# Patient Record
Sex: Female | Born: 1950
Health system: Southern US, Community
[De-identification: ages and names within clinical notes are randomized; demographics above are authoritative.]

## PROBLEM LIST (undated history)

## (undated) DIAGNOSIS — G20 Parkinson's disease: Secondary | ICD-10-CM

## (undated) DIAGNOSIS — Z9989 Dependence on other enabling machines and devices: Secondary | ICD-10-CM

## (undated) DIAGNOSIS — I482 Chronic atrial fibrillation, unspecified: Secondary | ICD-10-CM

## (undated) DIAGNOSIS — E1144 Type 2 diabetes mellitus with diabetic amyotrophy: Secondary | ICD-10-CM

## (undated) DIAGNOSIS — G541 Lumbosacral plexus disorders: Secondary | ICD-10-CM

## (undated) DIAGNOSIS — R55 Syncope and collapse: Secondary | ICD-10-CM

## (undated) DIAGNOSIS — M797 Fibromyalgia: Secondary | ICD-10-CM

## (undated) DIAGNOSIS — R32 Unspecified urinary incontinence: Secondary | ICD-10-CM

## (undated) DIAGNOSIS — G20A1 Parkinson's disease without dyskinesia, without mention of fluctuations: Secondary | ICD-10-CM

## (undated) DIAGNOSIS — I509 Heart failure, unspecified: Secondary | ICD-10-CM

## (undated) DIAGNOSIS — E876 Hypokalemia: Secondary | ICD-10-CM

## (undated) DIAGNOSIS — G2581 Restless legs syndrome: Secondary | ICD-10-CM

## (undated) DIAGNOSIS — E119 Type 2 diabetes mellitus without complications: Secondary | ICD-10-CM

## (undated) DIAGNOSIS — I5032 Chronic diastolic (congestive) heart failure: Secondary | ICD-10-CM

## (undated) DIAGNOSIS — F411 Generalized anxiety disorder: Secondary | ICD-10-CM

## (undated) DIAGNOSIS — F329 Major depressive disorder, single episode, unspecified: Secondary | ICD-10-CM

## (undated) DIAGNOSIS — G4733 Obstructive sleep apnea (adult) (pediatric): Secondary | ICD-10-CM

## (undated) HISTORY — DX: Unspecified urinary incontinence: R32

## (undated) HISTORY — DX: Chronic diastolic (congestive) heart failure: I50.32

## (undated) HISTORY — DX: Generalized anxiety disorder: F41.1

## (undated) HISTORY — PX: TONSILLECTOMY: SUR1361

## (undated) HISTORY — PX: MASS EXCISION: SHX2000

## (undated) HISTORY — DX: Major depressive disorder, single episode, unspecified: F32.9

## (undated) HISTORY — PX: KNEE ARTHROSCOPY: SUR90

## (undated) HISTORY — DX: Chronic atrial fibrillation, unspecified: I48.20

## (undated) HISTORY — PX: VAGINAL HYSTERECTOMY: SUR661

## (undated) HISTORY — PX: TOTAL KNEE ARTHROPLASTY: SHX125

## (undated) HISTORY — PX: JOINT REPLACEMENT: SHX530

## (undated) HISTORY — PX: TRANSTHORACIC ECHOCARDIOGRAM: SHX275

## (undated) HISTORY — PX: CHOLECYSTECTOMY OPEN: SUR202

## (undated) HISTORY — PX: SHOULDER OPEN ROTATOR CUFF REPAIR: SHX2407

---

## 1998-04-26 ENCOUNTER — Other Ambulatory Visit: Admission: RE | Admit: 1998-04-26 | Discharge: 1998-04-26 | Payer: Self-pay | Admitting: *Deleted

## 1999-07-05 ENCOUNTER — Ambulatory Visit (HOSPITAL_COMMUNITY): Admission: RE | Admit: 1999-07-05 | Discharge: 1999-07-05 | Payer: Self-pay | Admitting: Gastroenterology

## 2002-08-17 ENCOUNTER — Ambulatory Visit (HOSPITAL_COMMUNITY): Admission: RE | Admit: 2002-08-17 | Discharge: 2002-08-17 | Payer: Self-pay | Admitting: Specialist

## 2002-08-17 ENCOUNTER — Encounter: Payer: Self-pay | Admitting: Specialist

## 2002-08-17 ENCOUNTER — Encounter (INDEPENDENT_AMBULATORY_CARE_PROVIDER_SITE_OTHER): Payer: Self-pay | Admitting: *Deleted

## 2003-03-04 DIAGNOSIS — I482 Chronic atrial fibrillation, unspecified: Secondary | ICD-10-CM

## 2003-03-04 HISTORY — DX: Chronic atrial fibrillation, unspecified: I48.20

## 2003-06-19 ENCOUNTER — Other Ambulatory Visit: Admission: RE | Admit: 2003-06-19 | Discharge: 2003-06-19 | Payer: Self-pay | Admitting: *Deleted

## 2005-07-01 ENCOUNTER — Other Ambulatory Visit: Admission: RE | Admit: 2005-07-01 | Discharge: 2005-07-01 | Payer: Self-pay | Admitting: *Deleted

## 2005-07-22 ENCOUNTER — Encounter: Admission: RE | Admit: 2005-07-22 | Discharge: 2005-07-22 | Payer: Self-pay | Admitting: General Surgery

## 2006-12-17 ENCOUNTER — Ambulatory Visit (HOSPITAL_COMMUNITY): Admission: RE | Admit: 2006-12-17 | Discharge: 2006-12-18 | Payer: Self-pay | Admitting: Orthopaedic Surgery

## 2009-04-20 ENCOUNTER — Ambulatory Visit (HOSPITAL_COMMUNITY): Admission: RE | Admit: 2009-04-20 | Discharge: 2009-04-20 | Payer: Self-pay | Admitting: Rheumatology

## 2010-04-29 ENCOUNTER — Other Ambulatory Visit: Payer: Self-pay | Admitting: Orthopaedic Surgery

## 2010-04-29 DIAGNOSIS — M898X2 Other specified disorders of bone, upper arm: Secondary | ICD-10-CM

## 2010-05-04 ENCOUNTER — Ambulatory Visit
Admission: RE | Admit: 2010-05-04 | Discharge: 2010-05-04 | Disposition: A | Discharge status: Home / Self Care | Payer: Managed Care, Other (non HMO) | Source: Ambulatory Visit | Attending: Orthopaedic Surgery | Admitting: Orthopaedic Surgery

## 2010-05-04 DIAGNOSIS — M898X2 Other specified disorders of bone, upper arm: Secondary | ICD-10-CM

## 2010-05-20 ENCOUNTER — Other Ambulatory Visit: Payer: Self-pay | Admitting: Orthopaedic Surgery

## 2010-05-20 DIAGNOSIS — M25512 Pain in left shoulder: Secondary | ICD-10-CM

## 2010-05-25 ENCOUNTER — Ambulatory Visit
Admission: RE | Admit: 2010-05-25 | Discharge: 2010-05-25 | Disposition: A | Discharge status: Home / Self Care | Payer: Medicare Other | Source: Ambulatory Visit | Attending: Orthopaedic Surgery | Admitting: Orthopaedic Surgery

## 2010-05-25 DIAGNOSIS — M25512 Pain in left shoulder: Secondary | ICD-10-CM

## 2010-07-16 NOTE — Op Note (Signed)
NAME:  Donna [name], Donna [name]                   ACCOUNT NO.:  [id number]   MEDICAL RECORD NO.:  [id number]          PATIENT TYPE:  AMB   LOCATION:  SDS                          FACILITY:  [facility]   PHYSICIAN:  Claude Manges. Whitfield, M.D.DATE OF BIRTH:  1950/11/28   DATE OF PROCEDURE:  12/17/2006  DATE OF DISCHARGE:                               OPERATIVE REPORT   PREOPERATIVE DIAGNOSIS:  Rotator cuff tear with impingement of right  shoulder.   POSTOPERATIVE DIAGNOSES:  1. Rotator cuff tear with impingement of right shoulder.  2. Biceps tendon tear.   PROCEDURES:  1. Arthroscopic debridement right shoulder.  2. Open anterior-inferior acromioplasty.  3. Open biceps tendon tenodesis.  4. Open rotator cuff tear repair.   SURGEON:  Claude Manges. Cleophas Dunker, M.D.   ASSISTANT:  Rexene Edison, [facility].   ANESTHESIA:  General with supplemental interscalene nerve block.   COMPLICATIONS:  None.   HISTORY:  A 60 year old female who has been followed for the problems  referable to her right shoulder.  She has had difficulty for months,  particularly with pain and overhead activity.  She has evidence of  weakness and has tried anti-inflammatory medicines and pain medicines  without much relief.  She has had an MRI scan revealing a rotator cuff  tear, possibly a labral tear and a subacromial bursa formation.  She  wishes to proceed with arthroscopy and cuff tear repair and has been  cleared by her primary care physician.  She has multiple medical  problems.   PROCEDURE:  With the patient comfortable on the operating room table  under general orotracheal anesthesia, the patient was placed in semi-  sitting position with a shoulder frame.  Examination under anesthesia of  the right shoulder revealed no evidence of subluxation or adhesive  capsulitis.   The right shoulder was then prepped with DuraPrep from the base of neck  circumferentially to below the elbow.  Sterile draping was performed.   A marking pen was  used to outline what I thought was the [facility] joint, the  coracoid and the acromion.  Because of the patient's size, it was  technically very difficult to palpate landmarks and the procedure was  also very difficult because even the arthroscopy was challenging.   At a point a fingerbreadth posterior and medial to the posterior angle  acromion, a small incision was made and the arthroscope placed into the  shoulder joint.  The scope went in all the way to the hub.  I could  visualize the joint.  There was significant tearing of the biceps tendon  with multiple areas of fraying.  I thought the labrum was intact without  evidence of a tear.  There were some areas of chondromalacia of the  glenoid and particularly the humeral head where there was considerable  gouging of the articular cartilage consistent with the significant  osteoarthritis.  There was also synovitis throughout the joint.  A  second portal was established and shaving of the synovitis and humeral  head was performed.  On further visualization of the biceps tendon, it  was  felt that there was very little left and significant tearing and  accordingly, I elected to perform a biceps tenodesis.  There was also an  obvious rotator cuff tear as I could visualize a subacromial space.   I had difficulty placing the scope in the subacromial space and felt  that the best procedure was to make an incision anteriorly, not only  perform the acromioplasty but the tenodesis and the cuff tear repair.  About a 2 inch incision was then made over the anterior aspect of the  shoulder via sharp dissection.  Incision was carried down through  abundant adipose tissue.  There was approximately 3 inches or so of  adipose tissue before I encountered the deltoid fascia.  The fascia  seemed to be very poor quality.  I incised along what I felt was a raphe  and then separated the fibers.  The subacromial space was entered.  There was a moderate amount of  bursal material which was resected.  The  cuff tear was identified.  It was an L in shape involving the  supraspinatus.  I could retrieve the biceps tendon from the medial  aspect of the cuff tear, tagged it and incised it from its attachment to  the superior glenoid and then performed a tenodesis in the groove with a  Mitek anchor.  The tendon was considerably thick and torn.   The rough edges of the rotator cuff were sharply debrided.  I  established bleeding bone.  I inserted one Mitek anchor and then  performed the repair with 0 Ethibond suture.  I had a very nice repair  without any tension and supplemented with suture through bone.  The bone  also appeared to be osteopenic.   There was some anterior and lateral overhang of the acromion retracting  the shoulder and allowing space.  I removed the anterior and lateral  overhang with a rasp.  I got a very nice decompression.  I did not think  that there was considerable degenerative change or impingement from the  distal clavicle and therefore left it alone.  I irrigated the  subacromial space  again with a finger palpation and felt that I had a  nice decompression.  The deltoid fascia was then reapproximated although  I thought it was very flimsy tissue.  The wound was then closed in  several layers of 0 and 2-0 Vicryl, skin closed with skin clips.  Sterile bulky dressing was applied followed by a sling.   PLAN:  A 23-hour observation, then discharge with a sling and follow up  in a week.      Claude Manges. Cleophas Dunker, M.D.  Electronically Signed     PWW/MEDQ  D:  12/17/2006  T:  12/18/2006  Job:  [id number]

## 2010-07-19 NOTE — Procedures (Signed)
[place]  Patient:    Donna [name], Donna [name]                          MRN: [id number] Proc. Date: 07/05/99 Adm. Date:  [id number] Disc. Date: [id number] Attending:  Louie Bun CC:         Elgie Congo, M.D.                           Procedure Report  PROCEDURE PERFORMED:  Colonoscopy  INDICATION FOR PROCEDURE:  Iron deficiency anemia in a 60 year old patient who has previously had a hysterectomy.  Procedure is primarily to rule out neoplasm or inflammatory process of the colon.  DESCRIPTION OF PROCEDURE:  The patient was placed in the left lateral decubitus position and placed on the pulse monitor with continuous low flow oxygen delivered by nasal cannula.  She was sedated with 100 mg of IV Demerol and 8 mg IV Versed.  The Olympus video colonoscope was inserted into the rectum and advanced to the cecum, confirmed by transillumination of McBurneys point and visualization of the ileocecal valve and appendiceal orifice.  Prep was excellent. The cecum, ascending, transverse and descending colon all appeared normal with no masses, polyps, diverticula, or other mucosal abnormalities.  In the sigmoid colon were seen a few scattered diverticula. No other abnormalities noted.  The rectum appeared normal. On retroflex view the anus revealed no obvious internal hemorrhoids.  The colonoscope was then withdrawn and the patient returned to the recovery room in stable condition. She tolerated the procedure well and there were no immediate complications.  IMPRESSION:  Scattered sigmoid diverticula otherwise normal colonoscopy. DD:  07/05/99 TD:  07/09/99 Job: [id number]

## 2010-09-06 ENCOUNTER — Other Ambulatory Visit (HOSPITAL_COMMUNITY): Payer: Self-pay | Admitting: Orthopaedic Surgery

## 2010-09-06 ENCOUNTER — Encounter (HOSPITAL_COMMUNITY)
Admission: RE | Admit: 2010-09-06 | Discharge: 2010-09-06 | Disposition: A | Discharge status: Home / Self Care | Payer: Medicare Other | Source: Ambulatory Visit | Attending: Orthopaedic Surgery | Admitting: Orthopaedic Surgery

## 2010-09-06 ENCOUNTER — Ambulatory Visit (HOSPITAL_COMMUNITY)
Admission: RE | Admit: 2010-09-06 | Discharge: 2010-09-06 | Disposition: A | Discharge status: Home / Self Care | Payer: Medicare Other | Source: Ambulatory Visit | Attending: Orthopaedic Surgery | Admitting: Orthopaedic Surgery

## 2010-09-06 DIAGNOSIS — M25819 Other specified joint disorders, unspecified shoulder: Secondary | ICD-10-CM | POA: Insufficient documentation

## 2010-09-06 DIAGNOSIS — M7542 Impingement syndrome of left shoulder: Secondary | ICD-10-CM

## 2010-09-06 DIAGNOSIS — Z01818 Encounter for other preprocedural examination: Secondary | ICD-10-CM | POA: Insufficient documentation

## 2010-09-06 DIAGNOSIS — I10 Essential (primary) hypertension: Secondary | ICD-10-CM | POA: Insufficient documentation

## 2010-09-06 DIAGNOSIS — Z01810 Encounter for preprocedural cardiovascular examination: Secondary | ICD-10-CM | POA: Insufficient documentation

## 2010-09-06 DIAGNOSIS — Z01812 Encounter for preprocedural laboratory examination: Secondary | ICD-10-CM | POA: Insufficient documentation

## 2010-09-06 DIAGNOSIS — R0602 Shortness of breath: Secondary | ICD-10-CM | POA: Insufficient documentation

## 2010-09-06 LAB — COMPREHENSIVE METABOLIC PANEL
AST: 42 U/L — ABNORMAL HIGH (ref 0–37)
Albumin: 3.5 g/dL (ref 3.5–5.2)
Calcium: 10 mg/dL (ref 8.4–10.5)
Chloride: 102 mEq/L (ref 96–112)
Creatinine, Ser: 0.75 mg/dL (ref 0.50–1.10)
Total Bilirubin: 0.4 mg/dL (ref 0.3–1.2)
Total Protein: 6.9 g/dL (ref 6.0–8.3)

## 2010-09-06 LAB — CBC
HCT: 35.4 % — ABNORMAL LOW (ref 36.0–46.0)
RDW: 17.9 % — ABNORMAL HIGH (ref 11.5–15.5)
WBC: 10.7 10*3/uL — ABNORMAL HIGH (ref 4.0–10.5)

## 2010-09-06 LAB — DIFFERENTIAL
Basophils Absolute: 0.1 10*3/uL (ref 0.0–0.1)
Basophils Relative: 1 % (ref 0–1)
Eosinophils Absolute: 0.2 10*3/uL (ref 0.0–0.7)
Eosinophils Relative: 2 % (ref 0–5)
Lymphocytes Relative: 29 % (ref 12–46)
Lymphs Abs: 3.1 10*3/uL (ref 0.7–4.0)
Monocytes Absolute: 0.7 10*3/uL (ref 0.1–1.0)
Monocytes Relative: 6 % (ref 3–12)
Neutro Abs: 6.6 10*3/uL (ref 1.7–7.7)
Neutrophils Relative %: 62 % (ref 43–77)

## 2010-09-06 LAB — URINALYSIS, ROUTINE W REFLEX MICROSCOPIC
Glucose, UA: NEGATIVE mg/dL
Hgb urine dipstick: NEGATIVE
Specific Gravity, Urine: 1.02 (ref 1.005–1.030)
pH: 7 (ref 5.0–8.0)

## 2010-09-06 LAB — URINE MICROSCOPIC-ADD ON

## 2010-09-06 LAB — APTT: aPTT: 39 s — ABNORMAL HIGH (ref 24–37)

## 2010-09-06 LAB — PROTIME-INR
INR: 1.37 (ref 0.00–1.49)
Prothrombin Time: 17.1 s — ABNORMAL HIGH (ref 11.6–15.2)

## 2010-09-06 LAB — SURGICAL PCR SCREEN
MRSA, PCR: NEGATIVE
Staphylococcus aureus: NEGATIVE

## 2010-09-10 ENCOUNTER — Ambulatory Visit (HOSPITAL_COMMUNITY)
Admission: RE | Admit: 2010-09-10 | Discharge: 2010-09-11 | Disposition: A | Discharge status: Home / Self Care | Payer: Medicare Other | Source: Ambulatory Visit | Attending: Orthopaedic Surgery | Admitting: Orthopaedic Surgery

## 2010-09-10 DIAGNOSIS — M67919 Unspecified disorder of synovium and tendon, unspecified shoulder: Secondary | ICD-10-CM | POA: Insufficient documentation

## 2010-09-10 DIAGNOSIS — G2581 Restless legs syndrome: Secondary | ICD-10-CM | POA: Insufficient documentation

## 2010-09-10 DIAGNOSIS — I4891 Unspecified atrial fibrillation: Secondary | ICD-10-CM | POA: Insufficient documentation

## 2010-09-10 DIAGNOSIS — Z01810 Encounter for preprocedural cardiovascular examination: Secondary | ICD-10-CM | POA: Insufficient documentation

## 2010-09-10 DIAGNOSIS — Z01812 Encounter for preprocedural laboratory examination: Secondary | ICD-10-CM | POA: Insufficient documentation

## 2010-09-10 DIAGNOSIS — E119 Type 2 diabetes mellitus without complications: Secondary | ICD-10-CM | POA: Insufficient documentation

## 2010-09-10 DIAGNOSIS — M719 Bursopathy, unspecified: Secondary | ICD-10-CM | POA: Insufficient documentation

## 2010-09-10 DIAGNOSIS — Z79899 Other long term (current) drug therapy: Secondary | ICD-10-CM | POA: Insufficient documentation

## 2010-09-10 DIAGNOSIS — IMO0002 Reserved for concepts with insufficient information to code with codable children: Secondary | ICD-10-CM | POA: Insufficient documentation

## 2010-09-10 DIAGNOSIS — M25819 Other specified joint disorders, unspecified shoulder: Secondary | ICD-10-CM | POA: Insufficient documentation

## 2010-09-10 DIAGNOSIS — M659 Unspecified synovitis and tenosynovitis, unspecified site: Secondary | ICD-10-CM | POA: Insufficient documentation

## 2010-09-10 DIAGNOSIS — E559 Vitamin D deficiency, unspecified: Secondary | ICD-10-CM | POA: Insufficient documentation

## 2010-09-10 DIAGNOSIS — Z7982 Long term (current) use of aspirin: Secondary | ICD-10-CM | POA: Insufficient documentation

## 2010-09-10 DIAGNOSIS — I10 Essential (primary) hypertension: Secondary | ICD-10-CM | POA: Insufficient documentation

## 2010-09-10 DIAGNOSIS — K589 Irritable bowel syndrome without diarrhea: Secondary | ICD-10-CM | POA: Insufficient documentation

## 2010-09-10 DIAGNOSIS — I509 Heart failure, unspecified: Secondary | ICD-10-CM | POA: Insufficient documentation

## 2010-09-10 DIAGNOSIS — IMO0001 Reserved for inherently not codable concepts without codable children: Secondary | ICD-10-CM | POA: Insufficient documentation

## 2010-09-10 DIAGNOSIS — G4733 Obstructive sleep apnea (adult) (pediatric): Secondary | ICD-10-CM | POA: Insufficient documentation

## 2010-09-10 DIAGNOSIS — M7500 Adhesive capsulitis of unspecified shoulder: Secondary | ICD-10-CM | POA: Insufficient documentation

## 2010-09-10 DIAGNOSIS — M249 Joint derangement, unspecified: Secondary | ICD-10-CM | POA: Insufficient documentation

## 2010-09-10 DIAGNOSIS — M19019 Primary osteoarthritis, unspecified shoulder: Secondary | ICD-10-CM | POA: Insufficient documentation

## 2010-09-10 DIAGNOSIS — Z7901 Long term (current) use of anticoagulants: Secondary | ICD-10-CM | POA: Insufficient documentation

## 2010-09-10 DIAGNOSIS — G43909 Migraine, unspecified, not intractable, without status migrainosus: Secondary | ICD-10-CM | POA: Insufficient documentation

## 2010-09-10 DIAGNOSIS — F3289 Other specified depressive episodes: Secondary | ICD-10-CM | POA: Insufficient documentation

## 2010-09-10 DIAGNOSIS — F329 Major depressive disorder, single episode, unspecified: Secondary | ICD-10-CM | POA: Insufficient documentation

## 2010-09-10 DIAGNOSIS — M9420 Chondromalacia, unspecified site: Secondary | ICD-10-CM | POA: Insufficient documentation

## 2010-09-10 DIAGNOSIS — Z85828 Personal history of other malignant neoplasm of skin: Secondary | ICD-10-CM | POA: Insufficient documentation

## 2010-09-10 DIAGNOSIS — E782 Mixed hyperlipidemia: Secondary | ICD-10-CM | POA: Insufficient documentation

## 2010-09-10 DIAGNOSIS — E669 Obesity, unspecified: Secondary | ICD-10-CM | POA: Insufficient documentation

## 2010-09-10 LAB — GLUCOSE, CAPILLARY

## 2010-09-11 LAB — BASIC METABOLIC PANEL
BUN: 15 mg/dL (ref 6–23)
CO2: 28 mEq/L (ref 19–32)
Calcium: 8.6 mg/dL (ref 8.4–10.5)
Creatinine, Ser: 0.88 mg/dL (ref 0.50–1.10)
GFR calc Af Amer: >60 mL/min (ref >60)

## 2010-09-11 LAB — PROTIME-INR
INR: 1.16 (ref 0.00–1.49)
Prothrombin Time: 15 seconds (ref 11.6–15.2)

## 2010-09-11 LAB — GLUCOSE, CAPILLARY: Glucose-Capillary: 138 mg/dL — ABNORMAL HIGH (ref 70–99)

## 2010-09-11 NOTE — Op Note (Signed)
Donna [name], Donna [name]                   ACCOUNT NO.:  [id number]  MEDICAL RECORD NO.:  [id number]  LOCATION:  5003                         FACILITY:  [facility]  PHYSICIAN:  Claude Manges. Kamela Blansett, M.D.DATE OF BIRTH:  Mar 05, 1950  DATE OF PROCEDURE:  09/10/2010 DATE OF DISCHARGE:                              OPERATIVE REPORT   PREOPERATIVE DIAGNOSES: 1. Rotator cuff tear, left shoulder. 2. Impingement syndrome, left shoulder. 3. Degenerative joint disease, acromioclavicular joint, left shoulder. 4. Tear of biceps tendon, left shoulder. 5. Obesity.  POSTOPERATIVE DIAGNOSES: 1. Rotator cuff tear, left shoulder. 2. Impingement syndrome, left shoulder. 3. Degenerative joint disease, acromioclavicular joint, left shoulder. 4. Tear of biceps tendon, left shoulder. 5. Obesity. 6. Glenohumeral joint chondromalacia and labral tearing.  PROCEDURES: 1. Evaluation of left shoulder under anesthesia with lysis of adhesive     capsulitis. 2. Arthroscopic debridement of glenohumeral joint for chondromalacia     of the humeral head and glenoid, debridement of labral tearing. 3. Arthroscopic removal of one cartilaginous loose body. 4. Arthroscopic subacromial decompression. 5. Arthroscopic distal clavicle resection. 6. Open biceps tenodesis. 7. Mini open rotator cuff tear repair.  SURGEON:  Claude Manges. Cleophas Dunker, MD  ASSISTANT:  Oris Drone. Petrarca, PA-C  ANESTHESIA:  General.  COMPLICATIONS:  None.  HISTORY:  A 60 year old female has been experiencing progressive pain, persistent and progressive pain in her left shoulder for months.  She has not responded to cortisone injection.  She has accordingly had an MRI scan consistent with a rotator cuff tear.  She has degenerative changes at the [facility] joint type 2 acromion and probable biceps tendon tear. She is to have an arthroscopic evaluation.  She has not responded to the above including pain medicine in time and is bridging with Lovenox related to her  atrial fibrillation.  PROCEDURE:  Donna [name] was met in the holding area, identified her left shoulder as the appropriate operative site.  She was then transported to room #1 and placed under general orotracheal anesthesia.  She was placed in a semi-sitting position with a shoulder frame.  The left arm was draped free.  The arm was then prepped with DuraPrep from the base of the neck circumferentially below the elbow.  A sterile draping was performed.  Evaluation under anesthesia revealed adhesive capsulitis for the last 10- 15 degrees of overhead motion, manipulation was performed.  Marking pen was used to outline the [facility] joint, coracoid, acromion at a point a fingerbreadth posterior medial to the posterior angle acromion, a small stab wound was made.  The arthroscope was then placed easily into the shoulder joint.  There was minimal hemarthrosis.  Diagnostic arthroscopy revealed diffuse synovitis tearing of the biceps tendon, obvious tear of the rotator cuff as I could visualize a subacromial space.  There was also an area of chondromalacia of the humeral head and to greater extent the glenoid.  A second portal was established anteriorly with a Ribbed cannula.  I debrided the synovitis and the labral tearing.  I did find one small cartilaginous loose body and removed it with a grabber.  The biceps tendon was debrided, so I could better visualize it as it was torn  probably 75%.  The rotator cuff tear was fairly extensive involving predominantly the supraspinatus, but there was some partial tearing of the subscapularis.  The biceps appeared to be subluxed.  I debrided all of the synovitis and any fraying of the labrum.  The arthroscope was then placed at subacromial space posteriorly, the cannula at subacromial space anteriorly, and the third portal was established on the lateral subacromial space.  Arthroscopic debridement of abundant bursal tissue was performed.  There was obvious  anterior and to some extent lateral overhang of the acromion.  A 6-mm bur was used to perform an anterior and lateral acromioplasty.  The distal clavicle was consistent with arthritis.  There was abundant synovitis.  The synovitis was removed.  The distal clavicle was resected with a 6-mm bur, had a very nice resection.  I could see the obvious tearing of the rotator cuff through the bursal surface.  About an inch and half incision was then made over the anterior aspect of the shoulder and via sharp dissection carried down to subcutaneous tissue.  Via blunt dissection, the soft tissue was elevated off the deltoid bursa.  A raphe was identified and incised.  The subacromial space was then entered.  The rotator cuff tear was easily identified, it was fairly extensive involving the supraspinatus in the superior portion of the subscapularis.  The edges were frayed, these were sharply debrided.  The biceps tendon was identified, tagged and then tenodesed to the inferior groove using a Biomet 5.5-mm PEEK anchor and two #2 FiberWires.  A second anchor was then inserted along the humeral head more anteriorly and superiorly, but a nonarticular cartilage and then I repaired the cuff with the FiberWire from the anchor and a #1 Ethibond suture, had a very nice repair.  There was no tension and there was no evidence of impingement.  The wound was then copiously irrigated with saline solution.  We reattached and sutured the deltoid fascia anatomically with a #1 Vicryl, subcu with a 2-0 Vicryl and 3-0 Monocryl, skin was closed with Steri-Strips over Benzoin.  We did infiltrate the operative site with Marcaine with epinephrine.  Sterile bulky dressing was applied followed by a sling.  PLAN:  Observation overnight.  Dilaudid for pain.  Follow up in the office in approximately 1 week.     Claude Manges. Cleophas Dunker, M.D.     PWW/MEDQ  D:  09/10/2010  T:  09/11/2010  Job:  [id number]  Electronically  Signed by Norlene Campbell M.D. on 09/11/2010 11:42:32 AM

## 2010-09-12 LAB — GLUCOSE, CAPILLARY: Glucose-Capillary: 181 mg/dL — ABNORMAL HIGH (ref 70–99)

## 2010-12-11 LAB — CBC
Hemoglobin: 10.9 — ABNORMAL LOW
MCHC: 32.1
MCV: 77.2 — ABNORMAL LOW
RBC: 4.4
RDW: 16.1 — ABNORMAL HIGH

## 2010-12-11 LAB — BASIC METABOLIC PANEL
CO2: 27
Calcium: 9.2
Chloride: 102
GFR calc Af Amer: 58 — ABNORMAL LOW
Glucose, Bld: 190 — ABNORMAL HIGH
Sodium: 139

## 2010-12-11 LAB — PROTIME-INR: INR: 1.1

## 2010-12-12 LAB — URINE MICROSCOPIC-ADD ON

## 2010-12-12 LAB — COMPREHENSIVE METABOLIC PANEL
Albumin: 3.6
Alkaline Phosphatase: 97
BUN: 7
Calcium: 9.6
Creatinine, Ser: 0.76
Glucose, Bld: 134 — ABNORMAL HIGH
Total Protein: 7.3

## 2010-12-12 LAB — CBC
HCT: 40.3
Hemoglobin: 12.9
MCHC: 32
MCV: 76.8 — ABNORMAL LOW
Platelets: 340
RDW: 15.6 — ABNORMAL HIGH

## 2010-12-12 LAB — URINALYSIS, ROUTINE W REFLEX MICROSCOPIC
Bilirubin Urine: NEGATIVE
Glucose, UA: NEGATIVE
Ketones, ur: NEGATIVE
Protein, ur: NEGATIVE
pH: 7

## 2010-12-12 LAB — PROTIME-INR
INR: 1.7 — ABNORMAL HIGH
Prothrombin Time: 20.2 — ABNORMAL HIGH

## 2011-03-11 DIAGNOSIS — D539 Nutritional anemia, unspecified: Secondary | ICD-10-CM | POA: Diagnosis not present

## 2011-03-11 DIAGNOSIS — D649 Anemia, unspecified: Secondary | ICD-10-CM | POA: Diagnosis not present

## 2011-03-11 DIAGNOSIS — I10 Essential (primary) hypertension: Secondary | ICD-10-CM | POA: Diagnosis not present

## 2011-03-11 DIAGNOSIS — Z7901 Long term (current) use of anticoagulants: Secondary | ICD-10-CM | POA: Diagnosis not present

## 2011-03-11 DIAGNOSIS — I4891 Unspecified atrial fibrillation: Secondary | ICD-10-CM | POA: Diagnosis not present

## 2011-05-07 DIAGNOSIS — D649 Anemia, unspecified: Secondary | ICD-10-CM | POA: Diagnosis not present

## 2011-05-07 DIAGNOSIS — M25579 Pain in unspecified ankle and joints of unspecified foot: Secondary | ICD-10-CM | POA: Diagnosis not present

## 2011-05-07 DIAGNOSIS — E559 Vitamin D deficiency, unspecified: Secondary | ICD-10-CM | POA: Diagnosis not present

## 2011-05-07 DIAGNOSIS — D539 Nutritional anemia, unspecified: Secondary | ICD-10-CM | POA: Diagnosis not present

## 2011-05-07 DIAGNOSIS — Z7901 Long term (current) use of anticoagulants: Secondary | ICD-10-CM | POA: Diagnosis not present

## 2011-05-07 DIAGNOSIS — E782 Mixed hyperlipidemia: Secondary | ICD-10-CM | POA: Diagnosis not present

## 2011-05-07 DIAGNOSIS — Z79899 Other long term (current) drug therapy: Secondary | ICD-10-CM | POA: Diagnosis not present

## 2011-05-07 DIAGNOSIS — E119 Type 2 diabetes mellitus without complications: Secondary | ICD-10-CM | POA: Diagnosis not present

## 2011-05-21 DIAGNOSIS — E119 Type 2 diabetes mellitus without complications: Secondary | ICD-10-CM | POA: Diagnosis not present

## 2011-05-21 DIAGNOSIS — I509 Heart failure, unspecified: Secondary | ICD-10-CM | POA: Diagnosis not present

## 2011-05-21 DIAGNOSIS — R0989 Other specified symptoms and signs involving the circulatory and respiratory systems: Secondary | ICD-10-CM | POA: Diagnosis not present

## 2011-05-21 DIAGNOSIS — I4891 Unspecified atrial fibrillation: Secondary | ICD-10-CM | POA: Diagnosis not present

## 2011-05-21 DIAGNOSIS — Z7901 Long term (current) use of anticoagulants: Secondary | ICD-10-CM | POA: Diagnosis not present

## 2011-06-09 DIAGNOSIS — R51 Headache: Secondary | ICD-10-CM | POA: Diagnosis not present

## 2011-06-13 ENCOUNTER — Other Ambulatory Visit: Payer: Self-pay | Admitting: Diagnostic Neuroimaging

## 2011-06-13 DIAGNOSIS — R519 Headache, unspecified: Secondary | ICD-10-CM

## 2011-06-17 DIAGNOSIS — R51 Headache: Secondary | ICD-10-CM | POA: Diagnosis not present

## 2011-06-18 ENCOUNTER — Ambulatory Visit
Admission: RE | Admit: 2011-06-18 | Discharge: 2011-06-18 | Disposition: A | Discharge status: Home / Self Care | Payer: Medicare Other | Source: Ambulatory Visit | Attending: Diagnostic Neuroimaging | Admitting: Diagnostic Neuroimaging

## 2011-06-18 DIAGNOSIS — R51 Headache: Secondary | ICD-10-CM | POA: Diagnosis not present

## 2011-06-18 DIAGNOSIS — R519 Headache, unspecified: Secondary | ICD-10-CM

## 2011-06-18 MED ORDER — GADOBENATE DIMEGLUMINE 529 MG/ML IV SOLN
20.0000 mL | Freq: Once | INTRAVENOUS | Status: AC | PRN
  Administered 2011-06-18: 20 mL via INTRAVENOUS

## 2011-06-24 DIAGNOSIS — F332 Major depressive disorder, recurrent severe without psychotic features: Secondary | ICD-10-CM | POA: Diagnosis not present

## 2011-06-24 DIAGNOSIS — F411 Generalized anxiety disorder: Secondary | ICD-10-CM | POA: Diagnosis not present

## 2011-07-08 DIAGNOSIS — R040 Epistaxis: Secondary | ICD-10-CM | POA: Diagnosis not present

## 2011-07-08 DIAGNOSIS — E119 Type 2 diabetes mellitus without complications: Secondary | ICD-10-CM | POA: Diagnosis not present

## 2011-07-08 DIAGNOSIS — I4891 Unspecified atrial fibrillation: Secondary | ICD-10-CM | POA: Diagnosis not present

## 2011-07-08 DIAGNOSIS — Z7901 Long term (current) use of anticoagulants: Secondary | ICD-10-CM | POA: Diagnosis not present

## 2011-07-08 DIAGNOSIS — R0602 Shortness of breath: Secondary | ICD-10-CM | POA: Diagnosis not present

## 2011-07-08 DIAGNOSIS — I509 Heart failure, unspecified: Secondary | ICD-10-CM | POA: Diagnosis not present

## 2011-07-08 DIAGNOSIS — I10 Essential (primary) hypertension: Secondary | ICD-10-CM | POA: Diagnosis not present

## 2011-07-17 DIAGNOSIS — E119 Type 2 diabetes mellitus without complications: Secondary | ICD-10-CM | POA: Diagnosis not present

## 2011-07-17 DIAGNOSIS — Z7901 Long term (current) use of anticoagulants: Secondary | ICD-10-CM | POA: Diagnosis not present

## 2011-07-17 DIAGNOSIS — N959 Unspecified menopausal and perimenopausal disorder: Secondary | ICD-10-CM | POA: Diagnosis not present

## 2011-07-23 DIAGNOSIS — H40039 Anatomical narrow angle, unspecified eye: Secondary | ICD-10-CM | POA: Diagnosis not present

## 2011-07-23 DIAGNOSIS — H2510 Age-related nuclear cataract, unspecified eye: Secondary | ICD-10-CM | POA: Diagnosis not present

## 2011-07-23 DIAGNOSIS — E119 Type 2 diabetes mellitus without complications: Secondary | ICD-10-CM | POA: Diagnosis not present

## 2011-07-26 DIAGNOSIS — S058X9A Other injuries of unspecified eye and orbit, initial encounter: Secondary | ICD-10-CM | POA: Diagnosis not present

## 2011-07-27 DIAGNOSIS — S058X9A Other injuries of unspecified eye and orbit, initial encounter: Secondary | ICD-10-CM | POA: Diagnosis not present

## 2011-07-28 DIAGNOSIS — H40039 Anatomical narrow angle, unspecified eye: Secondary | ICD-10-CM | POA: Diagnosis not present

## 2011-07-29 DIAGNOSIS — R51 Headache: Secondary | ICD-10-CM | POA: Diagnosis not present

## 2011-07-30 DIAGNOSIS — Z7901 Long term (current) use of anticoagulants: Secondary | ICD-10-CM | POA: Diagnosis not present

## 2011-07-30 DIAGNOSIS — E782 Mixed hyperlipidemia: Secondary | ICD-10-CM | POA: Diagnosis not present

## 2011-07-30 DIAGNOSIS — I4891 Unspecified atrial fibrillation: Secondary | ICD-10-CM | POA: Diagnosis not present

## 2011-07-30 DIAGNOSIS — E119 Type 2 diabetes mellitus without complications: Secondary | ICD-10-CM | POA: Diagnosis not present

## 2011-07-31 DIAGNOSIS — H40039 Anatomical narrow angle, unspecified eye: Secondary | ICD-10-CM | POA: Diagnosis not present

## 2011-08-06 DIAGNOSIS — E119 Type 2 diabetes mellitus without complications: Secondary | ICD-10-CM | POA: Diagnosis not present

## 2011-08-06 DIAGNOSIS — R29898 Other symptoms and signs involving the musculoskeletal system: Secondary | ICD-10-CM | POA: Diagnosis not present

## 2011-08-06 DIAGNOSIS — D649 Anemia, unspecified: Secondary | ICD-10-CM | POA: Diagnosis not present

## 2011-08-06 DIAGNOSIS — E782 Mixed hyperlipidemia: Secondary | ICD-10-CM | POA: Diagnosis not present

## 2011-08-06 DIAGNOSIS — D539 Nutritional anemia, unspecified: Secondary | ICD-10-CM | POA: Diagnosis not present

## 2011-08-06 DIAGNOSIS — E559 Vitamin D deficiency, unspecified: Secondary | ICD-10-CM | POA: Diagnosis not present

## 2011-08-06 DIAGNOSIS — G894 Chronic pain syndrome: Secondary | ICD-10-CM | POA: Diagnosis not present

## 2011-08-06 DIAGNOSIS — IMO0002 Reserved for concepts with insufficient information to code with codable children: Secondary | ICD-10-CM | POA: Diagnosis not present

## 2011-08-06 DIAGNOSIS — Z7901 Long term (current) use of anticoagulants: Secondary | ICD-10-CM | POA: Diagnosis not present

## 2011-09-03 DIAGNOSIS — E119 Type 2 diabetes mellitus without complications: Secondary | ICD-10-CM | POA: Diagnosis not present

## 2011-09-03 DIAGNOSIS — Z7901 Long term (current) use of anticoagulants: Secondary | ICD-10-CM | POA: Diagnosis not present

## 2011-09-03 DIAGNOSIS — E559 Vitamin D deficiency, unspecified: Secondary | ICD-10-CM | POA: Diagnosis not present

## 2011-09-03 DIAGNOSIS — E782 Mixed hyperlipidemia: Secondary | ICD-10-CM | POA: Diagnosis not present

## 2011-09-08 DIAGNOSIS — M6281 Muscle weakness (generalized): Secondary | ICD-10-CM | POA: Diagnosis not present

## 2011-09-08 DIAGNOSIS — R262 Difficulty in walking, not elsewhere classified: Secondary | ICD-10-CM | POA: Diagnosis not present

## 2011-09-08 DIAGNOSIS — M25569 Pain in unspecified knee: Secondary | ICD-10-CM | POA: Diagnosis not present

## 2011-09-09 DIAGNOSIS — M25569 Pain in unspecified knee: Secondary | ICD-10-CM | POA: Diagnosis not present

## 2011-09-09 DIAGNOSIS — R262 Difficulty in walking, not elsewhere classified: Secondary | ICD-10-CM | POA: Diagnosis not present

## 2011-09-09 DIAGNOSIS — M6281 Muscle weakness (generalized): Secondary | ICD-10-CM | POA: Diagnosis not present

## 2011-09-12 DIAGNOSIS — M6281 Muscle weakness (generalized): Secondary | ICD-10-CM | POA: Diagnosis not present

## 2011-09-12 DIAGNOSIS — M25569 Pain in unspecified knee: Secondary | ICD-10-CM | POA: Diagnosis not present

## 2011-09-12 DIAGNOSIS — R262 Difficulty in walking, not elsewhere classified: Secondary | ICD-10-CM | POA: Diagnosis not present

## 2011-09-17 DIAGNOSIS — M6281 Muscle weakness (generalized): Secondary | ICD-10-CM | POA: Diagnosis not present

## 2011-09-17 DIAGNOSIS — R262 Difficulty in walking, not elsewhere classified: Secondary | ICD-10-CM | POA: Diagnosis not present

## 2011-09-17 DIAGNOSIS — M25569 Pain in unspecified knee: Secondary | ICD-10-CM | POA: Diagnosis not present

## 2011-09-18 DIAGNOSIS — M25569 Pain in unspecified knee: Secondary | ICD-10-CM | POA: Diagnosis not present

## 2011-09-18 DIAGNOSIS — R262 Difficulty in walking, not elsewhere classified: Secondary | ICD-10-CM | POA: Diagnosis not present

## 2011-09-18 DIAGNOSIS — M6281 Muscle weakness (generalized): Secondary | ICD-10-CM | POA: Diagnosis not present

## 2011-09-19 DIAGNOSIS — R262 Difficulty in walking, not elsewhere classified: Secondary | ICD-10-CM | POA: Diagnosis not present

## 2011-09-19 DIAGNOSIS — M25569 Pain in unspecified knee: Secondary | ICD-10-CM | POA: Diagnosis not present

## 2011-09-19 DIAGNOSIS — M6281 Muscle weakness (generalized): Secondary | ICD-10-CM | POA: Diagnosis not present

## 2011-09-22 DIAGNOSIS — M6281 Muscle weakness (generalized): Secondary | ICD-10-CM | POA: Diagnosis not present

## 2011-09-22 DIAGNOSIS — R262 Difficulty in walking, not elsewhere classified: Secondary | ICD-10-CM | POA: Diagnosis not present

## 2011-09-22 DIAGNOSIS — M25569 Pain in unspecified knee: Secondary | ICD-10-CM | POA: Diagnosis not present

## 2011-09-24 DIAGNOSIS — M25569 Pain in unspecified knee: Secondary | ICD-10-CM | POA: Diagnosis not present

## 2011-09-24 DIAGNOSIS — R262 Difficulty in walking, not elsewhere classified: Secondary | ICD-10-CM | POA: Diagnosis not present

## 2011-09-24 DIAGNOSIS — M6281 Muscle weakness (generalized): Secondary | ICD-10-CM | POA: Diagnosis not present

## 2011-09-26 DIAGNOSIS — M6281 Muscle weakness (generalized): Secondary | ICD-10-CM | POA: Diagnosis not present

## 2011-09-26 DIAGNOSIS — R262 Difficulty in walking, not elsewhere classified: Secondary | ICD-10-CM | POA: Diagnosis not present

## 2011-09-26 DIAGNOSIS — M25569 Pain in unspecified knee: Secondary | ICD-10-CM | POA: Diagnosis not present

## 2011-09-29 DIAGNOSIS — M6281 Muscle weakness (generalized): Secondary | ICD-10-CM | POA: Diagnosis not present

## 2011-09-29 DIAGNOSIS — R262 Difficulty in walking, not elsewhere classified: Secondary | ICD-10-CM | POA: Diagnosis not present

## 2011-09-29 DIAGNOSIS — M25569 Pain in unspecified knee: Secondary | ICD-10-CM | POA: Diagnosis not present

## 2011-10-01 DIAGNOSIS — R262 Difficulty in walking, not elsewhere classified: Secondary | ICD-10-CM | POA: Diagnosis not present

## 2011-10-01 DIAGNOSIS — M6281 Muscle weakness (generalized): Secondary | ICD-10-CM | POA: Diagnosis not present

## 2011-10-01 DIAGNOSIS — M25569 Pain in unspecified knee: Secondary | ICD-10-CM | POA: Diagnosis not present

## 2011-10-06 DIAGNOSIS — M6281 Muscle weakness (generalized): Secondary | ICD-10-CM | POA: Diagnosis not present

## 2011-10-06 DIAGNOSIS — R262 Difficulty in walking, not elsewhere classified: Secondary | ICD-10-CM | POA: Diagnosis not present

## 2011-10-06 DIAGNOSIS — M25569 Pain in unspecified knee: Secondary | ICD-10-CM | POA: Diagnosis not present

## 2011-10-07 DIAGNOSIS — M25569 Pain in unspecified knee: Secondary | ICD-10-CM | POA: Diagnosis not present

## 2011-10-07 DIAGNOSIS — M6281 Muscle weakness (generalized): Secondary | ICD-10-CM | POA: Diagnosis not present

## 2011-10-07 DIAGNOSIS — R262 Difficulty in walking, not elsewhere classified: Secondary | ICD-10-CM | POA: Diagnosis not present

## 2011-10-08 DIAGNOSIS — E119 Type 2 diabetes mellitus without complications: Secondary | ICD-10-CM | POA: Diagnosis not present

## 2011-10-08 DIAGNOSIS — I10 Essential (primary) hypertension: Secondary | ICD-10-CM | POA: Diagnosis not present

## 2011-10-08 DIAGNOSIS — Z0000 Encounter for general adult medical examination without abnormal findings: Secondary | ICD-10-CM | POA: Diagnosis not present

## 2011-10-08 DIAGNOSIS — Z7901 Long term (current) use of anticoagulants: Secondary | ICD-10-CM | POA: Diagnosis not present

## 2011-10-08 DIAGNOSIS — G894 Chronic pain syndrome: Secondary | ICD-10-CM | POA: Diagnosis not present

## 2011-10-10 DIAGNOSIS — M545 Low back pain, unspecified: Secondary | ICD-10-CM | POA: Diagnosis not present

## 2011-10-10 DIAGNOSIS — M25559 Pain in unspecified hip: Secondary | ICD-10-CM | POA: Diagnosis not present

## 2011-10-14 DIAGNOSIS — C44711 Basal cell carcinoma of skin of unspecified lower limb, including hip: Secondary | ICD-10-CM | POA: Diagnosis not present

## 2011-10-14 DIAGNOSIS — D2370 Other benign neoplasm of skin of unspecified lower limb, including hip: Secondary | ICD-10-CM | POA: Diagnosis not present

## 2011-10-14 DIAGNOSIS — D485 Neoplasm of uncertain behavior of skin: Secondary | ICD-10-CM | POA: Diagnosis not present

## 2011-10-14 DIAGNOSIS — Z85828 Personal history of other malignant neoplasm of skin: Secondary | ICD-10-CM | POA: Diagnosis not present

## 2011-10-22 DIAGNOSIS — M169 Osteoarthritis of hip, unspecified: Secondary | ICD-10-CM | POA: Diagnosis not present

## 2011-10-22 DIAGNOSIS — M25559 Pain in unspecified hip: Secondary | ICD-10-CM | POA: Diagnosis not present

## 2011-10-22 DIAGNOSIS — M1610 Unilateral primary osteoarthritis, unspecified hip: Secondary | ICD-10-CM | POA: Diagnosis not present

## 2011-11-05 DIAGNOSIS — D539 Nutritional anemia, unspecified: Secondary | ICD-10-CM | POA: Diagnosis not present

## 2011-11-05 DIAGNOSIS — G894 Chronic pain syndrome: Secondary | ICD-10-CM | POA: Diagnosis not present

## 2011-11-05 DIAGNOSIS — E119 Type 2 diabetes mellitus without complications: Secondary | ICD-10-CM | POA: Diagnosis not present

## 2011-11-05 DIAGNOSIS — E782 Mixed hyperlipidemia: Secondary | ICD-10-CM | POA: Diagnosis not present

## 2011-11-05 DIAGNOSIS — E559 Vitamin D deficiency, unspecified: Secondary | ICD-10-CM | POA: Diagnosis not present

## 2011-11-05 DIAGNOSIS — Z7901 Long term (current) use of anticoagulants: Secondary | ICD-10-CM | POA: Diagnosis not present

## 2011-11-05 DIAGNOSIS — D649 Anemia, unspecified: Secondary | ICD-10-CM | POA: Diagnosis not present

## 2011-11-05 DIAGNOSIS — N959 Unspecified menopausal and perimenopausal disorder: Secondary | ICD-10-CM | POA: Diagnosis not present

## 2011-12-09 DIAGNOSIS — R0989 Other specified symptoms and signs involving the circulatory and respiratory systems: Secondary | ICD-10-CM | POA: Diagnosis not present

## 2011-12-10 DIAGNOSIS — M169 Osteoarthritis of hip, unspecified: Secondary | ICD-10-CM | POA: Diagnosis not present

## 2011-12-10 DIAGNOSIS — M1610 Unilateral primary osteoarthritis, unspecified hip: Secondary | ICD-10-CM | POA: Diagnosis not present

## 2011-12-12 DIAGNOSIS — M169 Osteoarthritis of hip, unspecified: Secondary | ICD-10-CM | POA: Diagnosis not present

## 2011-12-12 DIAGNOSIS — M25559 Pain in unspecified hip: Secondary | ICD-10-CM | POA: Diagnosis not present

## 2011-12-12 DIAGNOSIS — M1610 Unilateral primary osteoarthritis, unspecified hip: Secondary | ICD-10-CM | POA: Diagnosis not present

## 2011-12-15 DIAGNOSIS — Z7901 Long term (current) use of anticoagulants: Secondary | ICD-10-CM | POA: Diagnosis not present

## 2011-12-15 DIAGNOSIS — E119 Type 2 diabetes mellitus without complications: Secondary | ICD-10-CM | POA: Diagnosis not present

## 2011-12-15 DIAGNOSIS — D649 Anemia, unspecified: Secondary | ICD-10-CM | POA: Diagnosis not present

## 2011-12-15 DIAGNOSIS — Z79899 Other long term (current) drug therapy: Secondary | ICD-10-CM | POA: Diagnosis not present

## 2011-12-15 DIAGNOSIS — G894 Chronic pain syndrome: Secondary | ICD-10-CM | POA: Diagnosis not present

## 2011-12-25 DIAGNOSIS — I10 Essential (primary) hypertension: Secondary | ICD-10-CM | POA: Diagnosis not present

## 2011-12-25 DIAGNOSIS — I4891 Unspecified atrial fibrillation: Secondary | ICD-10-CM | POA: Diagnosis not present

## 2011-12-25 DIAGNOSIS — I509 Heart failure, unspecified: Secondary | ICD-10-CM | POA: Diagnosis not present

## 2011-12-25 DIAGNOSIS — R0989 Other specified symptoms and signs involving the circulatory and respiratory systems: Secondary | ICD-10-CM | POA: Diagnosis not present

## 2011-12-30 DIAGNOSIS — R0602 Shortness of breath: Secondary | ICD-10-CM | POA: Diagnosis not present

## 2011-12-30 DIAGNOSIS — I509 Heart failure, unspecified: Secondary | ICD-10-CM | POA: Diagnosis not present

## 2011-12-30 DIAGNOSIS — J209 Acute bronchitis, unspecified: Secondary | ICD-10-CM | POA: Diagnosis not present

## 2011-12-30 DIAGNOSIS — I10 Essential (primary) hypertension: Secondary | ICD-10-CM | POA: Diagnosis not present

## 2012-01-06 DIAGNOSIS — F332 Major depressive disorder, recurrent severe without psychotic features: Secondary | ICD-10-CM | POA: Diagnosis not present

## 2012-01-06 DIAGNOSIS — F411 Generalized anxiety disorder: Secondary | ICD-10-CM | POA: Diagnosis not present

## 2012-01-15 DIAGNOSIS — I4891 Unspecified atrial fibrillation: Secondary | ICD-10-CM | POA: Diagnosis not present

## 2012-01-20 DIAGNOSIS — G589 Mononeuropathy, unspecified: Secondary | ICD-10-CM | POA: Diagnosis not present

## 2012-01-20 DIAGNOSIS — Z7901 Long term (current) use of anticoagulants: Secondary | ICD-10-CM | POA: Diagnosis not present

## 2012-01-20 DIAGNOSIS — G894 Chronic pain syndrome: Secondary | ICD-10-CM | POA: Diagnosis not present

## 2012-01-20 DIAGNOSIS — E782 Mixed hyperlipidemia: Secondary | ICD-10-CM | POA: Diagnosis not present

## 2012-02-19 DIAGNOSIS — E782 Mixed hyperlipidemia: Secondary | ICD-10-CM | POA: Diagnosis not present

## 2012-02-19 DIAGNOSIS — D539 Nutritional anemia, unspecified: Secondary | ICD-10-CM | POA: Diagnosis not present

## 2012-02-19 DIAGNOSIS — E559 Vitamin D deficiency, unspecified: Secondary | ICD-10-CM | POA: Diagnosis not present

## 2012-02-19 DIAGNOSIS — E119 Type 2 diabetes mellitus without complications: Secondary | ICD-10-CM | POA: Diagnosis not present

## 2012-02-19 DIAGNOSIS — G894 Chronic pain syndrome: Secondary | ICD-10-CM | POA: Diagnosis not present

## 2012-02-26 DIAGNOSIS — J984 Other disorders of lung: Secondary | ICD-10-CM | POA: Diagnosis not present

## 2012-02-26 DIAGNOSIS — E1059 Type 1 diabetes mellitus with other circulatory complications: Secondary | ICD-10-CM | POA: Diagnosis not present

## 2012-02-26 DIAGNOSIS — I4891 Unspecified atrial fibrillation: Secondary | ICD-10-CM | POA: Diagnosis not present

## 2012-02-26 DIAGNOSIS — R079 Chest pain, unspecified: Secondary | ICD-10-CM | POA: Diagnosis not present

## 2012-02-26 DIAGNOSIS — Z23 Encounter for immunization: Secondary | ICD-10-CM | POA: Diagnosis not present

## 2012-02-26 DIAGNOSIS — R918 Other nonspecific abnormal finding of lung field: Secondary | ICD-10-CM | POA: Diagnosis not present

## 2012-02-26 DIAGNOSIS — Z833 Family history of diabetes mellitus: Secondary | ICD-10-CM | POA: Diagnosis not present

## 2012-02-26 DIAGNOSIS — I359 Nonrheumatic aortic valve disorder, unspecified: Secondary | ICD-10-CM | POA: Diagnosis not present

## 2012-02-26 DIAGNOSIS — I509 Heart failure, unspecified: Secondary | ICD-10-CM | POA: Diagnosis not present

## 2012-02-26 DIAGNOSIS — IMO0001 Reserved for inherently not codable concepts without codable children: Secondary | ICD-10-CM | POA: Diagnosis present

## 2012-02-26 DIAGNOSIS — J9620 Acute and chronic respiratory failure, unspecified whether with hypoxia or hypercapnia: Secondary | ICD-10-CM | POA: Diagnosis present

## 2012-02-26 DIAGNOSIS — Z79899 Other long term (current) drug therapy: Secondary | ICD-10-CM | POA: Diagnosis not present

## 2012-02-26 DIAGNOSIS — Z7901 Long term (current) use of anticoagulants: Secondary | ICD-10-CM | POA: Diagnosis not present

## 2012-02-26 DIAGNOSIS — R0602 Shortness of breath: Secondary | ICD-10-CM | POA: Diagnosis not present

## 2012-02-26 DIAGNOSIS — E876 Hypokalemia: Secondary | ICD-10-CM | POA: Diagnosis not present

## 2012-02-26 DIAGNOSIS — J9610 Chronic respiratory failure, unspecified whether with hypoxia or hypercapnia: Secondary | ICD-10-CM | POA: Diagnosis not present

## 2012-02-26 DIAGNOSIS — R197 Diarrhea, unspecified: Secondary | ICD-10-CM | POA: Diagnosis not present

## 2012-02-26 DIAGNOSIS — I270 Primary pulmonary hypertension: Secondary | ICD-10-CM | POA: Diagnosis not present

## 2012-02-26 DIAGNOSIS — I10 Essential (primary) hypertension: Secondary | ICD-10-CM | POA: Diagnosis present

## 2012-02-26 DIAGNOSIS — E119 Type 2 diabetes mellitus without complications: Secondary | ICD-10-CM | POA: Diagnosis present

## 2012-02-26 DIAGNOSIS — Z7982 Long term (current) use of aspirin: Secondary | ICD-10-CM | POA: Diagnosis not present

## 2012-02-26 DIAGNOSIS — Z8249 Family history of ischemic heart disease and other diseases of the circulatory system: Secondary | ICD-10-CM | POA: Diagnosis not present

## 2012-02-26 DIAGNOSIS — I5033 Acute on chronic diastolic (congestive) heart failure: Secondary | ICD-10-CM | POA: Diagnosis present

## 2012-02-26 DIAGNOSIS — G2581 Restless legs syndrome: Secondary | ICD-10-CM | POA: Diagnosis present

## 2012-02-26 DIAGNOSIS — R0902 Hypoxemia: Secondary | ICD-10-CM | POA: Diagnosis not present

## 2012-03-05 DIAGNOSIS — Z79899 Other long term (current) drug therapy: Secondary | ICD-10-CM | POA: Diagnosis not present

## 2012-03-05 DIAGNOSIS — R0609 Other forms of dyspnea: Secondary | ICD-10-CM | POA: Diagnosis not present

## 2012-03-05 DIAGNOSIS — Z7901 Long term (current) use of anticoagulants: Secondary | ICD-10-CM | POA: Diagnosis not present

## 2012-03-05 DIAGNOSIS — R0989 Other specified symptoms and signs involving the circulatory and respiratory systems: Secondary | ICD-10-CM | POA: Diagnosis not present

## 2012-03-05 DIAGNOSIS — D649 Anemia, unspecified: Secondary | ICD-10-CM | POA: Diagnosis not present

## 2012-03-05 DIAGNOSIS — I509 Heart failure, unspecified: Secondary | ICD-10-CM | POA: Diagnosis not present

## 2012-03-05 DIAGNOSIS — E119 Type 2 diabetes mellitus without complications: Secondary | ICD-10-CM | POA: Diagnosis not present

## 2012-03-10 DIAGNOSIS — I509 Heart failure, unspecified: Secondary | ICD-10-CM | POA: Diagnosis not present

## 2012-03-10 DIAGNOSIS — R0609 Other forms of dyspnea: Secondary | ICD-10-CM | POA: Diagnosis not present

## 2012-03-10 DIAGNOSIS — I4891 Unspecified atrial fibrillation: Secondary | ICD-10-CM | POA: Diagnosis not present

## 2012-03-10 DIAGNOSIS — R0989 Other specified symptoms and signs involving the circulatory and respiratory systems: Secondary | ICD-10-CM | POA: Diagnosis not present

## 2012-03-11 DIAGNOSIS — I4891 Unspecified atrial fibrillation: Secondary | ICD-10-CM | POA: Diagnosis not present

## 2012-03-11 DIAGNOSIS — I509 Heart failure, unspecified: Secondary | ICD-10-CM | POA: Diagnosis not present

## 2012-03-18 DIAGNOSIS — I4891 Unspecified atrial fibrillation: Secondary | ICD-10-CM | POA: Diagnosis not present

## 2012-03-18 DIAGNOSIS — R0602 Shortness of breath: Secondary | ICD-10-CM | POA: Diagnosis not present

## 2012-03-18 DIAGNOSIS — I509 Heart failure, unspecified: Secondary | ICD-10-CM | POA: Diagnosis not present

## 2012-04-05 DIAGNOSIS — G4730 Sleep apnea, unspecified: Secondary | ICD-10-CM | POA: Diagnosis not present

## 2012-04-05 DIAGNOSIS — I4891 Unspecified atrial fibrillation: Secondary | ICD-10-CM | POA: Diagnosis not present

## 2012-04-05 DIAGNOSIS — R0902 Hypoxemia: Secondary | ICD-10-CM | POA: Diagnosis not present

## 2012-04-05 DIAGNOSIS — R0602 Shortness of breath: Secondary | ICD-10-CM | POA: Diagnosis not present

## 2012-04-05 DIAGNOSIS — I509 Heart failure, unspecified: Secondary | ICD-10-CM | POA: Diagnosis not present

## 2012-04-08 DIAGNOSIS — G894 Chronic pain syndrome: Secondary | ICD-10-CM | POA: Diagnosis not present

## 2012-04-08 DIAGNOSIS — E119 Type 2 diabetes mellitus without complications: Secondary | ICD-10-CM | POA: Diagnosis not present

## 2012-04-08 DIAGNOSIS — Z7901 Long term (current) use of anticoagulants: Secondary | ICD-10-CM | POA: Diagnosis not present

## 2012-04-08 DIAGNOSIS — H9190 Unspecified hearing loss, unspecified ear: Secondary | ICD-10-CM | POA: Diagnosis not present

## 2012-04-12 DIAGNOSIS — G4710 Hypersomnia, unspecified: Secondary | ICD-10-CM | POA: Diagnosis not present

## 2012-04-12 DIAGNOSIS — IMO0002 Reserved for concepts with insufficient information to code with codable children: Secondary | ICD-10-CM | POA: Diagnosis not present

## 2012-04-12 DIAGNOSIS — G2581 Restless legs syndrome: Secondary | ICD-10-CM | POA: Diagnosis not present

## 2012-04-12 DIAGNOSIS — I509 Heart failure, unspecified: Secondary | ICD-10-CM | POA: Diagnosis not present

## 2012-04-13 DIAGNOSIS — G250 Essential tremor: Secondary | ICD-10-CM | POA: Diagnosis not present

## 2012-04-13 DIAGNOSIS — G2581 Restless legs syndrome: Secondary | ICD-10-CM | POA: Diagnosis not present

## 2012-04-13 DIAGNOSIS — E1149 Type 2 diabetes mellitus with other diabetic neurological complication: Secondary | ICD-10-CM | POA: Diagnosis not present

## 2012-04-13 DIAGNOSIS — E119 Type 2 diabetes mellitus without complications: Secondary | ICD-10-CM | POA: Diagnosis not present

## 2012-04-29 DIAGNOSIS — M1610 Unilateral primary osteoarthritis, unspecified hip: Secondary | ICD-10-CM | POA: Diagnosis not present

## 2012-04-29 DIAGNOSIS — M25559 Pain in unspecified hip: Secondary | ICD-10-CM | POA: Diagnosis not present

## 2012-04-29 DIAGNOSIS — M169 Osteoarthritis of hip, unspecified: Secondary | ICD-10-CM | POA: Diagnosis not present

## 2012-05-06 DIAGNOSIS — G894 Chronic pain syndrome: Secondary | ICD-10-CM | POA: Diagnosis not present

## 2012-05-06 DIAGNOSIS — E782 Mixed hyperlipidemia: Secondary | ICD-10-CM | POA: Diagnosis not present

## 2012-05-06 DIAGNOSIS — D649 Anemia, unspecified: Secondary | ICD-10-CM | POA: Diagnosis not present

## 2012-05-06 DIAGNOSIS — E559 Vitamin D deficiency, unspecified: Secondary | ICD-10-CM | POA: Diagnosis not present

## 2012-05-06 DIAGNOSIS — D539 Nutritional anemia, unspecified: Secondary | ICD-10-CM | POA: Diagnosis not present

## 2012-05-06 DIAGNOSIS — E119 Type 2 diabetes mellitus without complications: Secondary | ICD-10-CM | POA: Diagnosis not present

## 2012-05-06 DIAGNOSIS — Z7901 Long term (current) use of anticoagulants: Secondary | ICD-10-CM | POA: Diagnosis not present

## 2012-05-14 DIAGNOSIS — H903 Sensorineural hearing loss, bilateral: Secondary | ICD-10-CM | POA: Diagnosis not present

## 2012-05-26 DIAGNOSIS — G250 Essential tremor: Secondary | ICD-10-CM | POA: Diagnosis not present

## 2012-05-26 DIAGNOSIS — G252 Other specified forms of tremor: Secondary | ICD-10-CM | POA: Diagnosis not present

## 2012-06-14 DIAGNOSIS — H903 Sensorineural hearing loss, bilateral: Secondary | ICD-10-CM | POA: Diagnosis not present

## 2012-06-14 DIAGNOSIS — H905 Unspecified sensorineural hearing loss: Secondary | ICD-10-CM | POA: Diagnosis not present

## 2012-06-16 DIAGNOSIS — F332 Major depressive disorder, recurrent severe without psychotic features: Secondary | ICD-10-CM | POA: Diagnosis not present

## 2012-06-16 DIAGNOSIS — F411 Generalized anxiety disorder: Secondary | ICD-10-CM | POA: Diagnosis not present

## 2012-06-23 DIAGNOSIS — Z7901 Long term (current) use of anticoagulants: Secondary | ICD-10-CM | POA: Diagnosis not present

## 2012-06-23 DIAGNOSIS — E119 Type 2 diabetes mellitus without complications: Secondary | ICD-10-CM | POA: Diagnosis not present

## 2012-06-23 DIAGNOSIS — D509 Iron deficiency anemia, unspecified: Secondary | ICD-10-CM | POA: Diagnosis not present

## 2012-06-23 DIAGNOSIS — E559 Vitamin D deficiency, unspecified: Secondary | ICD-10-CM | POA: Diagnosis not present

## 2012-06-29 DIAGNOSIS — R079 Chest pain, unspecified: Secondary | ICD-10-CM | POA: Diagnosis not present

## 2012-06-29 DIAGNOSIS — R0602 Shortness of breath: Secondary | ICD-10-CM | POA: Diagnosis not present

## 2012-06-29 DIAGNOSIS — R42 Dizziness and giddiness: Secondary | ICD-10-CM | POA: Diagnosis not present

## 2012-06-29 DIAGNOSIS — M6281 Muscle weakness (generalized): Secondary | ICD-10-CM | POA: Diagnosis not present

## 2012-07-07 DIAGNOSIS — G252 Other specified forms of tremor: Secondary | ICD-10-CM | POA: Diagnosis not present

## 2012-07-07 DIAGNOSIS — G250 Essential tremor: Secondary | ICD-10-CM | POA: Diagnosis not present

## 2012-07-12 DIAGNOSIS — R42 Dizziness and giddiness: Secondary | ICD-10-CM | POA: Diagnosis not present

## 2012-07-12 DIAGNOSIS — R079 Chest pain, unspecified: Secondary | ICD-10-CM | POA: Diagnosis not present

## 2012-07-19 DIAGNOSIS — R079 Chest pain, unspecified: Secondary | ICD-10-CM | POA: Diagnosis not present

## 2012-07-19 DIAGNOSIS — I10 Essential (primary) hypertension: Secondary | ICD-10-CM | POA: Diagnosis not present

## 2012-07-19 DIAGNOSIS — R42 Dizziness and giddiness: Secondary | ICD-10-CM | POA: Diagnosis not present

## 2012-07-19 DIAGNOSIS — R0602 Shortness of breath: Secondary | ICD-10-CM | POA: Diagnosis not present

## 2012-07-28 DIAGNOSIS — M169 Osteoarthritis of hip, unspecified: Secondary | ICD-10-CM | POA: Diagnosis not present

## 2012-07-28 DIAGNOSIS — M1610 Unilateral primary osteoarthritis, unspecified hip: Secondary | ICD-10-CM | POA: Diagnosis not present

## 2012-08-03 DIAGNOSIS — M1610 Unilateral primary osteoarthritis, unspecified hip: Secondary | ICD-10-CM | POA: Diagnosis not present

## 2012-08-03 DIAGNOSIS — M169 Osteoarthritis of hip, unspecified: Secondary | ICD-10-CM | POA: Diagnosis not present

## 2012-08-03 DIAGNOSIS — M25559 Pain in unspecified hip: Secondary | ICD-10-CM | POA: Diagnosis not present

## 2012-08-05 DIAGNOSIS — M216X9 Other acquired deformities of unspecified foot: Secondary | ICD-10-CM | POA: Diagnosis not present

## 2012-08-05 DIAGNOSIS — G250 Essential tremor: Secondary | ICD-10-CM | POA: Diagnosis not present

## 2012-08-05 DIAGNOSIS — G252 Other specified forms of tremor: Secondary | ICD-10-CM | POA: Diagnosis not present

## 2012-08-06 DIAGNOSIS — E119 Type 2 diabetes mellitus without complications: Secondary | ICD-10-CM | POA: Diagnosis not present

## 2012-08-06 DIAGNOSIS — Z7901 Long term (current) use of anticoagulants: Secondary | ICD-10-CM | POA: Diagnosis not present

## 2012-08-06 DIAGNOSIS — G8929 Other chronic pain: Secondary | ICD-10-CM | POA: Diagnosis not present

## 2012-08-07 DIAGNOSIS — M797 Fibromyalgia: Secondary | ICD-10-CM | POA: Diagnosis present

## 2012-08-07 DIAGNOSIS — G2581 Restless legs syndrome: Secondary | ICD-10-CM | POA: Diagnosis present

## 2012-08-11 DIAGNOSIS — M216X9 Other acquired deformities of unspecified foot: Secondary | ICD-10-CM | POA: Diagnosis not present

## 2012-08-19 DIAGNOSIS — E1149 Type 2 diabetes mellitus with other diabetic neurological complication: Secondary | ICD-10-CM | POA: Diagnosis not present

## 2012-08-19 DIAGNOSIS — G252 Other specified forms of tremor: Secondary | ICD-10-CM | POA: Diagnosis not present

## 2012-08-19 DIAGNOSIS — G250 Essential tremor: Secondary | ICD-10-CM | POA: Diagnosis not present

## 2012-09-01 DIAGNOSIS — Z7901 Long term (current) use of anticoagulants: Secondary | ICD-10-CM | POA: Diagnosis not present

## 2012-09-01 DIAGNOSIS — G609 Hereditary and idiopathic neuropathy, unspecified: Secondary | ICD-10-CM | POA: Diagnosis not present

## 2012-09-01 DIAGNOSIS — R609 Edema, unspecified: Secondary | ICD-10-CM | POA: Diagnosis not present

## 2012-09-01 DIAGNOSIS — I8390 Asymptomatic varicose veins of unspecified lower extremity: Secondary | ICD-10-CM | POA: Diagnosis not present

## 2012-09-07 DIAGNOSIS — Z7901 Long term (current) use of anticoagulants: Secondary | ICD-10-CM | POA: Diagnosis not present

## 2012-09-07 DIAGNOSIS — E782 Mixed hyperlipidemia: Secondary | ICD-10-CM | POA: Diagnosis not present

## 2012-09-07 DIAGNOSIS — Z6838 Body mass index (BMI) 38.0-38.9, adult: Secondary | ICD-10-CM | POA: Diagnosis not present

## 2012-09-07 DIAGNOSIS — E559 Vitamin D deficiency, unspecified: Secondary | ICD-10-CM | POA: Diagnosis not present

## 2012-09-07 DIAGNOSIS — E119 Type 2 diabetes mellitus without complications: Secondary | ICD-10-CM | POA: Diagnosis not present

## 2012-09-07 DIAGNOSIS — Z79899 Other long term (current) drug therapy: Secondary | ICD-10-CM | POA: Diagnosis not present

## 2012-09-07 DIAGNOSIS — D649 Anemia, unspecified: Secondary | ICD-10-CM | POA: Diagnosis not present

## 2012-09-07 DIAGNOSIS — G8929 Other chronic pain: Secondary | ICD-10-CM | POA: Diagnosis not present

## 2012-09-12 ENCOUNTER — Emergency Department (HOSPITAL_BASED_OUTPATIENT_CLINIC_OR_DEPARTMENT_OTHER): Payer: Medicare Other

## 2012-09-12 ENCOUNTER — Encounter (HOSPITAL_BASED_OUTPATIENT_CLINIC_OR_DEPARTMENT_OTHER): Payer: Self-pay | Admitting: Emergency Medicine

## 2012-09-12 ENCOUNTER — Emergency Department (HOSPITAL_BASED_OUTPATIENT_CLINIC_OR_DEPARTMENT_OTHER)
Admission: EM | Admit: 2012-09-12 | Discharge: 2012-09-12 | Disposition: A | Discharge status: Home / Self Care | Payer: Medicare Other | Attending: Emergency Medicine | Admitting: Emergency Medicine

## 2012-09-12 DIAGNOSIS — T07XXXA Unspecified multiple injuries, initial encounter: Secondary | ICD-10-CM

## 2012-09-12 DIAGNOSIS — G2581 Restless legs syndrome: Secondary | ICD-10-CM | POA: Insufficient documentation

## 2012-09-12 DIAGNOSIS — I509 Heart failure, unspecified: Secondary | ICD-10-CM | POA: Diagnosis not present

## 2012-09-12 DIAGNOSIS — R4182 Altered mental status, unspecified: Secondary | ICD-10-CM | POA: Diagnosis not present

## 2012-09-12 DIAGNOSIS — F329 Major depressive disorder, single episode, unspecified: Secondary | ICD-10-CM | POA: Diagnosis not present

## 2012-09-12 DIAGNOSIS — IMO0002 Reserved for concepts with insufficient information to code with codable children: Secondary | ICD-10-CM | POA: Diagnosis not present

## 2012-09-12 DIAGNOSIS — I959 Hypotension, unspecified: Secondary | ICD-10-CM | POA: Diagnosis not present

## 2012-09-12 DIAGNOSIS — Z7982 Long term (current) use of aspirin: Secondary | ICD-10-CM | POA: Insufficient documentation

## 2012-09-12 DIAGNOSIS — R509 Fever, unspecified: Secondary | ICD-10-CM | POA: Diagnosis not present

## 2012-09-12 DIAGNOSIS — Y92009 Unspecified place in unspecified non-institutional (private) residence as the place of occurrence of the external cause: Secondary | ICD-10-CM | POA: Insufficient documentation

## 2012-09-12 DIAGNOSIS — E1149 Type 2 diabetes mellitus with other diabetic neurological complication: Secondary | ICD-10-CM | POA: Insufficient documentation

## 2012-09-12 DIAGNOSIS — G541 Lumbosacral plexus disorders: Secondary | ICD-10-CM | POA: Insufficient documentation

## 2012-09-12 DIAGNOSIS — S7000XA Contusion of unspecified hip, initial encounter: Secondary | ICD-10-CM | POA: Diagnosis not present

## 2012-09-12 DIAGNOSIS — S0993XA Unspecified injury of face, initial encounter: Secondary | ICD-10-CM | POA: Insufficient documentation

## 2012-09-12 DIAGNOSIS — M25559 Pain in unspecified hip: Secondary | ICD-10-CM | POA: Diagnosis not present

## 2012-09-12 DIAGNOSIS — Z79899 Other long term (current) drug therapy: Secondary | ICD-10-CM | POA: Insufficient documentation

## 2012-09-12 DIAGNOSIS — Z794 Long term (current) use of insulin: Secondary | ICD-10-CM | POA: Diagnosis not present

## 2012-09-12 DIAGNOSIS — Z8679 Personal history of other diseases of the circulatory system: Secondary | ICD-10-CM | POA: Diagnosis not present

## 2012-09-12 DIAGNOSIS — F3289 Other specified depressive episodes: Secondary | ICD-10-CM | POA: Diagnosis not present

## 2012-09-12 DIAGNOSIS — S46909A Unspecified injury of unspecified muscle, fascia and tendon at shoulder and upper arm level, unspecified arm, initial encounter: Secondary | ICD-10-CM | POA: Diagnosis not present

## 2012-09-12 DIAGNOSIS — S0990XA Unspecified injury of head, initial encounter: Secondary | ICD-10-CM | POA: Insufficient documentation

## 2012-09-12 DIAGNOSIS — S79919A Unspecified injury of unspecified hip, initial encounter: Secondary | ICD-10-CM | POA: Diagnosis not present

## 2012-09-12 DIAGNOSIS — Y9301 Activity, walking, marching and hiking: Secondary | ICD-10-CM | POA: Insufficient documentation

## 2012-09-12 DIAGNOSIS — S4980XA Other specified injuries of shoulder and upper arm, unspecified arm, initial encounter: Secondary | ICD-10-CM | POA: Diagnosis not present

## 2012-09-12 DIAGNOSIS — M25519 Pain in unspecified shoulder: Secondary | ICD-10-CM | POA: Diagnosis not present

## 2012-09-12 DIAGNOSIS — W010XXA Fall on same level from slipping, tripping and stumbling without subsequent striking against object, initial encounter: Secondary | ICD-10-CM | POA: Insufficient documentation

## 2012-09-12 DIAGNOSIS — IMO0001 Reserved for inherently not codable concepts without codable children: Secondary | ICD-10-CM | POA: Diagnosis not present

## 2012-09-12 DIAGNOSIS — I4891 Unspecified atrial fibrillation: Secondary | ICD-10-CM | POA: Insufficient documentation

## 2012-09-12 DIAGNOSIS — M549 Dorsalgia, unspecified: Secondary | ICD-10-CM | POA: Diagnosis not present

## 2012-09-12 DIAGNOSIS — E119 Type 2 diabetes mellitus without complications: Secondary | ICD-10-CM | POA: Diagnosis not present

## 2012-09-12 DIAGNOSIS — S199XXA Unspecified injury of neck, initial encounter: Secondary | ICD-10-CM | POA: Insufficient documentation

## 2012-09-12 DIAGNOSIS — M542 Cervicalgia: Secondary | ICD-10-CM | POA: Diagnosis not present

## 2012-09-12 DIAGNOSIS — S79929A Unspecified injury of unspecified thigh, initial encounter: Secondary | ICD-10-CM | POA: Diagnosis not present

## 2012-09-12 DIAGNOSIS — R296 Repeated falls: Secondary | ICD-10-CM | POA: Diagnosis not present

## 2012-09-12 HISTORY — DX: Type 2 diabetes mellitus with diabetic amyotrophy: E11.44

## 2012-09-12 HISTORY — DX: Restless legs syndrome: G25.81

## 2012-09-12 HISTORY — DX: Heart failure, unspecified: I50.9

## 2012-09-12 HISTORY — DX: Fibromyalgia: M79.7

## 2012-09-12 HISTORY — DX: Lumbosacral plexus disorders: G54.1

## 2012-09-12 LAB — CBC WITH DIFFERENTIAL/PLATELET
Basophils Relative: 0 % (ref 0–1)
Eosinophils Absolute: 0.3 10*3/uL (ref 0.0–0.7)
Eosinophils Relative: 3 % (ref 0–5)
Hemoglobin: 11.3 g/dL — ABNORMAL LOW (ref 12.0–15.0)
MCH: 25.1 pg — ABNORMAL LOW (ref 26.0–34.0)
MCHC: 30.1 g/dL (ref 30.0–36.0)
MCV: 83.3 fL (ref 78.0–100.0)
Monocytes Absolute: 0.6 10*3/uL (ref 0.1–1.0)
Monocytes Relative: 7 % (ref 3–12)
Neutrophils Relative %: 72 % (ref 43–77)

## 2012-09-12 LAB — BASIC METABOLIC PANEL
BUN: 12 mg/dL (ref 6–23)
Calcium: 10 mg/dL (ref 8.4–10.5)
Creatinine, Ser: 1 mg/dL (ref 0.50–1.10)
GFR calc Af Amer: 69 mL/min — ABNORMAL LOW (ref >90)
GFR calc non Af Amer: 60 mL/min — ABNORMAL LOW (ref >90)
Potassium: 3.8 mEq/L (ref 3.5–5.1)

## 2012-09-12 MED ORDER — FENTANYL CITRATE 0.05 MG/ML IJ SOLN
INTRAMUSCULAR | Status: AC
  Filled 2012-09-12: qty 2

## 2012-09-12 MED ORDER — FENTANYL CITRATE 0.05 MG/ML IJ SOLN
50.0000 ug | Freq: Once | INTRAMUSCULAR | Status: AC
  Administered 2012-09-12: 50 ug via INTRAVENOUS

## 2012-09-12 NOTE — ED Provider Notes (Signed)
CT negative for fracture.  Patient able to ambulate at bedside. Has pain medication and walker at home. Declines admission for observation. Knows to hold coumadin for 3 days and followup with PCP. Return precautions discussed.  Glynn Octave, MD 09/12/12 [phone]

## 2012-09-12 NOTE — ED Notes (Signed)
Pt c/o pain to RT hip s/p fall this am; able to bear some weight

## 2012-09-12 NOTE — ED Provider Notes (Addendum)
History    CSN: [id number] Arrival date & time 09/12/12  1311  First MD Initiated Contact with Patient 09/12/12 1347     Chief Complaint  Patient presents with  . Hip Pain   (Consider location/radiation/quality/duration/timing/severity/associated sxs/prior Treatment) HPI Comments: Patient presents with right hip pain after sustaining a fall. She usually uses a walker at home and she was trying to walk outside without her walker when she turned around to grab her walker she tripped and fell over onto her right hip. She complains of pain and bruising around the right hip. She also has some pain in the right shoulder. She is on Coumadin and states that she did hit her head but there is no loss of consciousness. She's been feeling more sleepy since the incident but she has taken 2 of her oxycodone prior to arrival. She has some pain primarily on the right side of her neck. She denies any numbness or weakness in her extremities. She complains of midback pain as well.  Patient is a 62 y.o. female presenting with hip pain.  Hip Pain Associated symptoms include headaches. Pertinent negatives include no chest pain, no abdominal pain and no shortness of breath.   Past Medical History  Diagnosis Date  . A-fib   . CHF (congestive heart failure)   . Diabetes mellitus without complication   . Restless leg syndrome   . Fibromyalgia   . Depression   . Diabetic lumbosacral plexopathy   . Enlarged heart    Past Surgical History  Procedure Laterality Date  . Cholecystectomy    . Tonsillectomy    . Rotator cuff repair Bilateral   . Knee arthroscopy Right   . Abdominal hysterectomy     No family history on file. History  Substance Use Topics  . Smoking status: Never Smoker   . Smokeless tobacco: Not on file  . Alcohol Use: No   OB History   Grav Para Term Preterm Abortions TAB SAB Ect Mult Living                 Review of Systems  Constitutional: Negative for fever, chills, diaphoresis  and fatigue.  HENT: Positive for neck pain. Negative for congestion, rhinorrhea and sneezing.   Eyes: Negative.   Respiratory: Negative for cough, chest tightness and shortness of breath.   Cardiovascular: Negative for chest pain and leg swelling.  Gastrointestinal: Negative for nausea, vomiting, abdominal pain, diarrhea and blood in stool.  Genitourinary: Negative for frequency, hematuria, flank pain and difficulty urinating.  Musculoskeletal: Positive for back pain and arthralgias.  Skin: Negative for rash and wound.  Neurological: Positive for headaches. Negative for dizziness, speech difficulty, weakness and numbness.    Allergies  Metformin and related  Home Medications   Current Outpatient Rx  Name  Route  Sig  Dispense  Refill  . ALPRAZolam (XANAX) 0.25 MG tablet   Oral   Take 0.25 mg by mouth 3 (three) times daily as needed for sleep.         . Armodafinil (NUVIGIL) 150 MG tablet   Oral   Take 150 mg by mouth daily.         Marland Kitchen aspirin 81 MG tablet   Oral   Take 81 mg by mouth daily.         . busPIRone (BUSPAR) 5 MG tablet   Oral   Take 5 mg by mouth 2 (two) times daily.         . Calcium Carbonate (  CALCIUM 600) 1500 MG TABS   Oral   Take 1-2 tablets by mouth daily.         . Cholecalciferol (VITAMIN D) 400 UNITS capsule   Oral   Take 800 Units by mouth daily.         . digoxin (LANOXIN) 0.125 MG tablet   Oral   Take 0.125 mg by mouth daily.         . ferrous sulfate 325 (65 FE) MG tablet   Oral   Take 325 mg by mouth 2 (two) times daily.         Marland Kitchen FLUoxetine (PROZAC) 40 MG capsule   Oral   Take 80 mg by mouth daily.         . furosemide (LASIX) 20 MG tablet   Oral   Take 20 mg by mouth every other day.         . glyBURIDE (DIABETA) 5 MG tablet   Oral   Take 5 mg by mouth 2 (two) times daily.         . Insulin Pen Needle (NOVOTWIST) 32G X 5 MM MISC   Does not apply   by Does not apply route.         . isosorbide  mononitrate (IMDUR) 30 MG 24 hr tablet   Oral   Take 30 mg by mouth daily.         Marland Kitchen lamoTRIgine (LAMICTAL) 200 MG tablet   Oral   Take 200 mg by mouth daily.         Marland Kitchen lidocaine (LIDODERM) 5 %   Transdermal   Place 1 patch onto the skin daily. Remove & Discard patch within 12 hours or as directed by MD         . Liraglutide (VICTOZA) 18 MG/3ML SOPN   Subcutaneous   Inject 1.8 Units into the skin daily.         Marland Kitchen lisinopril (PRINIVIL,ZESTRIL) 40 MG tablet   Oral   Take 40 mg by mouth 2 (two) times daily.         Marland Kitchen loperamide (IMODIUM A-D) 2 MG tablet   Oral   Take 2 mg by mouth 2 (two) times daily.         . magnesium oxide (MAG-OX) 400 MG tablet   Oral   Take 400 mg by mouth 4 (four) times daily.         . Melatonin 300 MCG TABS   Oral   Take 1 tablet by mouth at bedtime as needed.         . methocarbamol (ROBAXIN) 750 MG tablet   Oral   Take 750 mg by mouth every 4 (four) hours as needed.         . metoprolol succinate (TOPROL-XL) 100 MG 24 hr tablet   Oral   Take 100 mg by mouth 3 (three) times daily. Take with or immediately following a meal.         . Multiple Vitamin (MULTIVITAMIN) tablet   Oral   Take 1 tablet by mouth daily.         . nebivolol (BYSTOLIC) 10 MG tablet   Oral   Take 10 mg by mouth daily.         . ondansetron (ZOFRAN) 8 MG tablet   Oral   Take 8 mg by mouth every 4 (four) hours.         . OxyCODONE HCl, Abuse Deter, 5 MG TABA  Oral   Take 1 tablet by mouth 3 (three) times daily.         . potassium chloride SA (K-DUR,KLOR-CON) 20 MEQ tablet   Oral   Take 20 mEq by mouth as directed.         Marland Kitchen rOPINIRole (REQUIP) 1 MG tablet   Oral   Take 1 mg by mouth 2 (two) times daily.         . temazepam (RESTORIL) 30 MG capsule   Oral   Take 30 mg by mouth at bedtime as needed for sleep.          BP 108/52  Pulse 72  Temp(Src) 98 F (36.7 C) (Oral)  Resp 16  Ht 5' 10.5" (1.791 m)  Wt 265 lb  (120.203 kg)  BMI 37.47 kg/m2  SpO2 92% Physical Exam  Constitutional: She is oriented to person, place, and time. She appears well-developed and well-nourished.  HENT:  Head: Normocephalic and atraumatic.  Eyes: Pupils are equal, round, and reactive to light.  Neck:  Positive tenderness to the right paraspinal area and also the right trapezius muscle  Cardiovascular: Normal rate, regular rhythm and normal heart sounds.   Pulmonary/Chest: Effort normal and breath sounds normal. No respiratory distress. She has no wheezes. She has no rales. She exhibits no tenderness.  Abdominal: Soft. Bowel sounds are normal. There is no tenderness. There is no rebound and no guarding.  Musculoskeletal: Normal range of motion. She exhibits no edema.  Positive tenderness to the mid thoracic spine. There is no pain to the lumbosacral spine. She has tenderness on palpation and range of motion the right shoulder with an overlying contusion. There is no pain to the elbow or the wrist. She has good pulses and sensation in the hand. She has marked tenderness on palpation of the right lateral hip. She has a large overlying hematoma with ecchymosis to the right lateral hip. There is no pain lower in the femur. There is no pain around the knee although she does have overlying ecchymotic area here. There is no other pain on palpation or range of motion extremities. She is positive distal pulses in the right foot with normal sensation in the foot.  Lymphadenopathy:    She has no cervical adenopathy.  Neurological: She is alert and oriented to person, place, and time.  Skin: Skin is warm and dry. No rash noted.  Psychiatric: She has a normal mood and affect.    ED Course  Procedures (including critical care time) Results for orders placed during the hospital encounter of 09/12/12  CBC WITH DIFFERENTIAL      Result Value Range   WBC 9.1  4.0 - 10.5 K/uL   RBC 4.50  3.87 - 5.11 MIL/uL   Hemoglobin 11.3 (*) 12.0 - 15.0  g/dL   HCT 04.5  40.9 - 81.1 %   MCV 83.3  78.0 - 100.0 fL   MCH 25.1 (*) 26.0 - 34.0 pg   MCHC 30.1  30.0 - 36.0 g/dL   RDW 91.4 (*) 78.2 - 95.6 %   Platelets 201  150 - 400 K/uL   Neutrophils Relative % 72  43 - 77 %   Neutro Abs 6.6  1.7 - 7.7 K/uL   Lymphocytes Relative 18  12 - 46 %   Lymphs Abs 1.6  0.7 - 4.0 K/uL   Monocytes Relative 7  3 - 12 %   Monocytes Absolute 0.6  0.1 - 1.0 K/uL   Eosinophils Relative  3  0 - 5 %   Eosinophils Absolute 0.3  0.0 - 0.7 K/uL   Basophils Relative 0  0 - 1 %   Basophils Absolute 0.0  0.0 - 0.1 K/uL  PROTIME-INR      Result Value Range   Prothrombin Time 33.3 (*) 11.6 - 15.2 seconds   INR 3.43 (*) 0.00 - 1.49   Dg Thoracic Spine 2 View  09/12/2012   *RADIOLOGY REPORT*  Clinical Data: Status post fall.  Back pain.  THORACIC SPINE - 2 VIEW  Comparison: PA and lateral chest 09/06/2010.  Findings: No fracture or subluxation is identified.  Multilevel degenerative change is noted.  IMPRESSION: No acute finding.   Original Report Authenticated By: Holley Dexter, M.D.   Dg Shoulder Right  09/12/2012   *RADIOLOGY REPORT*  Clinical Data: Status post fall.  Right shoulder pain.  RIGHT SHOULDER - 2+ VIEW  Comparison: None.  Findings: No fracture or dislocation is identified.  The patient is status post rotator cuff repair.  Acromioclavicular degenerative disease is noted.  Imaged right lung and ribs are unremarkable.  IMPRESSION:  1.  No acute finding. 2.  Acromioclavicular osteoarthritis. 3.  Status post rotator cuff repair.   Original Report Authenticated By: Holley Dexter, M.D.   Dg Hip Complete Right  09/12/2012   *RADIOLOGY REPORT*  Clinical Data: Status post fall today.  Right hip pain.  RIGHT HIP - COMPLETE 2+ VIEW  Comparison: None.  Findings: Both hips are located.  No fracture is identified.  The patient has severe right hip osteoarthritis.  IMPRESSION:  1.  No acute finding. 2.  Severe right hip osteoarthritis.   Original Report  Authenticated By: Holley Dexter, M.D.   Ct Head Wo Contrast  09/12/2012   *RADIOLOGY REPORT*  Clinical Data:  Fall.  Neck pain  CT HEAD WITHOUT CONTRAST CT CERVICAL SPINE WITHOUT CONTRAST  Technique:  Multidetector CT imaging of the head and cervical spine was performed following the standard protocol without intravenous contrast.  Multiplanar CT image reconstructions of the cervical spine were also generated.  Comparison:  MRI 06/18/2011  CT HEAD  Findings: Mild generalized atrophy.  Negative for hydrocephalus. Negative for acute hemorrhage, mass, or infarct.  Negative for skull fracture.  IMPRESSION: No acute intracranial abnormality.  CT CERVICAL SPINE  Findings: Negative for fracture.  Normal alignment.  Small central disc protrusion at C3-4.  Mild spondylosis and facet hypertrophy on the left at C4-5.  Mild facet hypertrophy on the left at C5-6 with mild disc degeneration at C5-6.  IMPRESSION: Negative for fracture.   Original Report Authenticated By: Janeece Riggers, M.D.   Ct Cervical Spine Wo Contrast  09/12/2012   *RADIOLOGY REPORT*  Clinical Data:  Fall.  Neck pain  CT HEAD WITHOUT CONTRAST CT CERVICAL SPINE WITHOUT CONTRAST  Technique:  Multidetector CT imaging of the head and cervical spine was performed following the standard protocol without intravenous contrast.  Multiplanar CT image reconstructions of the cervical spine were also generated.  Comparison:  MRI 06/18/2011  CT HEAD  Findings: Mild generalized atrophy.  Negative for hydrocephalus. Negative for acute hemorrhage, mass, or infarct.  Negative for skull fracture.  IMPRESSION: No acute intracranial abnormality.  CT CERVICAL SPINE  Findings: Negative for fracture.  Normal alignment.  Small central disc protrusion at C3-4.  Mild spondylosis and facet hypertrophy on the left at C4-5.  Mild facet hypertrophy on the left at C5-6 with mild disc degeneration at C5-6.  IMPRESSION: Negative for fracture.  Original Report Authenticated By: Janeece Riggers, M.D.     Dg Thoracic Spine 2 View  09/12/2012   *RADIOLOGY REPORT*  Clinical Data: Status post fall.  Back pain.  THORACIC SPINE - 2 VIEW  Comparison: PA and lateral chest 09/06/2010.  Findings: No fracture or subluxation is identified.  Multilevel degenerative change is noted.  IMPRESSION: No acute finding.   Original Report Authenticated By: Holley Dexter, M.D.   Dg Shoulder Right  09/12/2012   *RADIOLOGY REPORT*  Clinical Data: Status post fall.  Right shoulder pain.  RIGHT SHOULDER - 2+ VIEW  Comparison: None.  Findings: No fracture or dislocation is identified.  The patient is status post rotator cuff repair.  Acromioclavicular degenerative disease is noted.  Imaged right lung and ribs are unremarkable.  IMPRESSION:  1.  No acute finding. 2.  Acromioclavicular osteoarthritis. 3.  Status post rotator cuff repair.   Original Report Authenticated By: Holley Dexter, M.D.   Dg Hip Complete Right  09/12/2012   *RADIOLOGY REPORT*  Clinical Data: Status post fall today.  Right hip pain.  RIGHT HIP - COMPLETE 2+ VIEW  Comparison: None.  Findings: Both hips are located.  No fracture is identified.  The patient has severe right hip osteoarthritis.  IMPRESSION:  1.  No acute finding. 2.  Severe right hip osteoarthritis.   Original Report Authenticated By: Holley Dexter, M.D.   Ct Head Wo Contrast  09/12/2012   *RADIOLOGY REPORT*  Clinical Data:  Fall.  Neck pain  CT HEAD WITHOUT CONTRAST CT CERVICAL SPINE WITHOUT CONTRAST  Technique:  Multidetector CT imaging of the head and cervical spine was performed following the standard protocol without intravenous contrast.  Multiplanar CT image reconstructions of the cervical spine were also generated.  Comparison:  MRI 06/18/2011  CT HEAD  Findings: Mild generalized atrophy.  Negative for hydrocephalus. Negative for acute hemorrhage, mass, or infarct.  Negative for skull fracture.  IMPRESSION: No acute intracranial abnormality.  CT CERVICAL SPINE   Findings: Negative for fracture.  Normal alignment.  Small central disc protrusion at C3-4.  Mild spondylosis and facet hypertrophy on the left at C4-5.  Mild facet hypertrophy on the left at C5-6 with mild disc degeneration at C5-6.  IMPRESSION: Negative for fracture.   Original Report Authenticated By: Janeece Riggers, M.D.   Ct Cervical Spine Wo Contrast  09/12/2012   *RADIOLOGY REPORT*  Clinical Data:  Fall.  Neck pain  CT HEAD WITHOUT CONTRAST CT CERVICAL SPINE WITHOUT CONTRAST  Technique:  Multidetector CT imaging of the head and cervical spine was performed following the standard protocol without intravenous contrast.  Multiplanar CT image reconstructions of the cervical spine were also generated.  Comparison:  MRI 06/18/2011  CT HEAD  Findings: Mild generalized atrophy.  Negative for hydrocephalus. Negative for acute hemorrhage, mass, or infarct.  Negative for skull fracture.  IMPRESSION: No acute intracranial abnormality.  CT CERVICAL SPINE  Findings: Negative for fracture.  Normal alignment.  Small central disc protrusion at C3-4.  Mild spondylosis and facet hypertrophy on the left at C4-5.  Mild facet hypertrophy on the left at C5-6 with mild disc degeneration at C5-6.  IMPRESSION: Negative for fracture.   Original Report Authenticated By: Janeece Riggers, M.D.   1. Multiple contusions     MDM  X-rays currently do not reveal any fractures. She has no evidence of intracranial hemorrhage. She has no abdominal tenderness on exam. Her hemoglobin is stable compared to her past values. Given her large hematoma around her  right hip with significant tenderness to this area I will do a CT scan to rule out occult fracture.  Dr Manus Gunning to f/u on CT results. I have advised her husband to have her hold coumadin for 2 days and discuss with her PMD prior to restarting.  Rolan Bucco, MD 09/12/12 1535  Rolan Bucco, MD 09/12/12 [phone]

## 2012-09-12 NOTE — ED Notes (Signed)
Patient placed on 2L [place] for o2 of 86%. Increased to 99%

## 2012-09-13 DIAGNOSIS — S0990XA Unspecified injury of head, initial encounter: Secondary | ICD-10-CM | POA: Diagnosis not present

## 2012-09-13 DIAGNOSIS — I270 Primary pulmonary hypertension: Secondary | ICD-10-CM | POA: Diagnosis not present

## 2012-09-13 DIAGNOSIS — IMO0002 Reserved for concepts with insufficient information to code with codable children: Secondary | ICD-10-CM | POA: Diagnosis not present

## 2012-09-13 DIAGNOSIS — R079 Chest pain, unspecified: Secondary | ICD-10-CM | POA: Diagnosis not present

## 2012-09-13 DIAGNOSIS — F411 Generalized anxiety disorder: Secondary | ICD-10-CM | POA: Diagnosis present

## 2012-09-13 DIAGNOSIS — F3289 Other specified depressive episodes: Secondary | ICD-10-CM | POA: Diagnosis present

## 2012-09-13 DIAGNOSIS — S4980XA Other specified injuries of shoulder and upper arm, unspecified arm, initial encounter: Secondary | ICD-10-CM | POA: Diagnosis not present

## 2012-09-13 DIAGNOSIS — Z5189 Encounter for other specified aftercare: Secondary | ICD-10-CM | POA: Diagnosis not present

## 2012-09-13 DIAGNOSIS — R509 Fever, unspecified: Secondary | ICD-10-CM | POA: Diagnosis not present

## 2012-09-13 DIAGNOSIS — M542 Cervicalgia: Secondary | ICD-10-CM | POA: Diagnosis not present

## 2012-09-13 DIAGNOSIS — R0789 Other chest pain: Secondary | ICD-10-CM | POA: Diagnosis not present

## 2012-09-13 DIAGNOSIS — D62 Acute posthemorrhagic anemia: Secondary | ICD-10-CM | POA: Diagnosis not present

## 2012-09-13 DIAGNOSIS — Z794 Long term (current) use of insulin: Secondary | ICD-10-CM | POA: Diagnosis not present

## 2012-09-13 DIAGNOSIS — G2581 Restless legs syndrome: Secondary | ICD-10-CM | POA: Diagnosis not present

## 2012-09-13 DIAGNOSIS — I369 Nonrheumatic tricuspid valve disorder, unspecified: Secondary | ICD-10-CM | POA: Diagnosis not present

## 2012-09-13 DIAGNOSIS — F332 Major depressive disorder, recurrent severe without psychotic features: Secondary | ICD-10-CM | POA: Diagnosis not present

## 2012-09-13 DIAGNOSIS — R296 Repeated falls: Secondary | ICD-10-CM | POA: Diagnosis not present

## 2012-09-13 DIAGNOSIS — T148XXA Other injury of unspecified body region, initial encounter: Secondary | ICD-10-CM | POA: Diagnosis not present

## 2012-09-13 DIAGNOSIS — Z9181 History of falling: Secondary | ICD-10-CM | POA: Diagnosis not present

## 2012-09-13 DIAGNOSIS — I10 Essential (primary) hypertension: Secondary | ICD-10-CM | POA: Diagnosis not present

## 2012-09-13 DIAGNOSIS — G92 Toxic encephalopathy: Secondary | ICD-10-CM | POA: Diagnosis not present

## 2012-09-13 DIAGNOSIS — IMO0001 Reserved for inherently not codable concepts without codable children: Secondary | ICD-10-CM | POA: Diagnosis not present

## 2012-09-13 DIAGNOSIS — S7000XA Contusion of unspecified hip, initial encounter: Secondary | ICD-10-CM | POA: Diagnosis not present

## 2012-09-13 DIAGNOSIS — Z7982 Long term (current) use of aspirin: Secondary | ICD-10-CM | POA: Diagnosis not present

## 2012-09-13 DIAGNOSIS — I4891 Unspecified atrial fibrillation: Secondary | ICD-10-CM | POA: Diagnosis not present

## 2012-09-13 DIAGNOSIS — F339 Major depressive disorder, recurrent, unspecified: Secondary | ICD-10-CM | POA: Diagnosis not present

## 2012-09-13 DIAGNOSIS — D649 Anemia, unspecified: Secondary | ICD-10-CM | POA: Diagnosis not present

## 2012-09-13 DIAGNOSIS — F329 Major depressive disorder, single episode, unspecified: Secondary | ICD-10-CM | POA: Diagnosis present

## 2012-09-13 DIAGNOSIS — I059 Rheumatic mitral valve disease, unspecified: Secondary | ICD-10-CM | POA: Diagnosis not present

## 2012-09-13 DIAGNOSIS — M549 Dorsalgia, unspecified: Secondary | ICD-10-CM | POA: Diagnosis not present

## 2012-09-13 DIAGNOSIS — R4182 Altered mental status, unspecified: Secondary | ICD-10-CM | POA: Diagnosis not present

## 2012-09-13 DIAGNOSIS — M25559 Pain in unspecified hip: Secondary | ICD-10-CM | POA: Diagnosis not present

## 2012-09-13 DIAGNOSIS — Z7901 Long term (current) use of anticoagulants: Secondary | ICD-10-CM | POA: Diagnosis not present

## 2012-09-13 DIAGNOSIS — G894 Chronic pain syndrome: Secondary | ICD-10-CM | POA: Diagnosis present

## 2012-09-13 DIAGNOSIS — I509 Heart failure, unspecified: Secondary | ICD-10-CM | POA: Diagnosis not present

## 2012-09-13 DIAGNOSIS — S79929A Unspecified injury of unspecified thigh, initial encounter: Secondary | ICD-10-CM | POA: Diagnosis not present

## 2012-09-13 DIAGNOSIS — Z79899 Other long term (current) drug therapy: Secondary | ICD-10-CM | POA: Diagnosis not present

## 2012-09-13 DIAGNOSIS — I517 Cardiomegaly: Secondary | ICD-10-CM | POA: Diagnosis not present

## 2012-09-13 DIAGNOSIS — G039 Meningitis, unspecified: Secondary | ICD-10-CM | POA: Diagnosis not present

## 2012-09-13 DIAGNOSIS — G929 Unspecified toxic encephalopathy: Secondary | ICD-10-CM | POA: Diagnosis not present

## 2012-09-13 DIAGNOSIS — S79919A Unspecified injury of unspecified hip, initial encounter: Secondary | ICD-10-CM | POA: Diagnosis not present

## 2012-09-13 DIAGNOSIS — Y9301 Activity, walking, marching and hiking: Secondary | ICD-10-CM | POA: Diagnosis not present

## 2012-09-13 DIAGNOSIS — I6350 Cerebral infarction due to unspecified occlusion or stenosis of unspecified cerebral artery: Secondary | ICD-10-CM | POA: Diagnosis not present

## 2012-09-13 DIAGNOSIS — M5126 Other intervertebral disc displacement, lumbar region: Secondary | ICD-10-CM | POA: Diagnosis not present

## 2012-09-13 DIAGNOSIS — R0902 Hypoxemia: Secondary | ICD-10-CM | POA: Diagnosis present

## 2012-09-13 DIAGNOSIS — S46909A Unspecified injury of unspecified muscle, fascia and tendon at shoulder and upper arm level, unspecified arm, initial encounter: Secondary | ICD-10-CM | POA: Diagnosis not present

## 2012-09-13 DIAGNOSIS — M25519 Pain in unspecified shoulder: Secondary | ICD-10-CM | POA: Diagnosis not present

## 2012-09-13 DIAGNOSIS — S0993XA Unspecified injury of face, initial encounter: Secondary | ICD-10-CM | POA: Diagnosis not present

## 2012-09-13 DIAGNOSIS — S199XXA Unspecified injury of neck, initial encounter: Secondary | ICD-10-CM | POA: Diagnosis not present

## 2012-09-13 DIAGNOSIS — D72829 Elevated white blood cell count, unspecified: Secondary | ICD-10-CM | POA: Diagnosis not present

## 2012-09-13 DIAGNOSIS — G40909 Epilepsy, unspecified, not intractable, without status epilepticus: Secondary | ICD-10-CM | POA: Diagnosis not present

## 2012-09-13 DIAGNOSIS — G9340 Encephalopathy, unspecified: Secondary | ICD-10-CM | POA: Diagnosis not present

## 2012-09-13 DIAGNOSIS — R839 Unspecified abnormal finding in cerebrospinal fluid: Secondary | ICD-10-CM | POA: Diagnosis not present

## 2012-09-13 DIAGNOSIS — E119 Type 2 diabetes mellitus without complications: Secondary | ICD-10-CM | POA: Diagnosis not present

## 2012-09-13 DIAGNOSIS — R55 Syncope and collapse: Secondary | ICD-10-CM | POA: Diagnosis not present

## 2012-09-13 DIAGNOSIS — T07XXXA Unspecified multiple injuries, initial encounter: Secondary | ICD-10-CM | POA: Diagnosis not present

## 2012-09-21 DIAGNOSIS — G92 Toxic encephalopathy: Secondary | ICD-10-CM | POA: Diagnosis not present

## 2012-09-21 DIAGNOSIS — G894 Chronic pain syndrome: Secondary | ICD-10-CM | POA: Diagnosis not present

## 2012-09-21 DIAGNOSIS — F3289 Other specified depressive episodes: Secondary | ICD-10-CM | POA: Diagnosis not present

## 2012-09-21 DIAGNOSIS — S7000XA Contusion of unspecified hip, initial encounter: Secondary | ICD-10-CM | POA: Diagnosis not present

## 2012-09-21 DIAGNOSIS — Z79899 Other long term (current) drug therapy: Secondary | ICD-10-CM | POA: Diagnosis not present

## 2012-09-21 DIAGNOSIS — F332 Major depressive disorder, recurrent severe without psychotic features: Secondary | ICD-10-CM | POA: Diagnosis not present

## 2012-09-21 DIAGNOSIS — D649 Anemia, unspecified: Secondary | ICD-10-CM | POA: Diagnosis not present

## 2012-09-21 DIAGNOSIS — Z5189 Encounter for other specified aftercare: Secondary | ICD-10-CM | POA: Diagnosis not present

## 2012-09-21 DIAGNOSIS — Z794 Long term (current) use of insulin: Secondary | ICD-10-CM | POA: Diagnosis not present

## 2012-09-21 DIAGNOSIS — I509 Heart failure, unspecified: Secondary | ICD-10-CM | POA: Diagnosis not present

## 2012-09-21 DIAGNOSIS — I10 Essential (primary) hypertension: Secondary | ICD-10-CM | POA: Diagnosis not present

## 2012-09-21 DIAGNOSIS — G929 Unspecified toxic encephalopathy: Secondary | ICD-10-CM | POA: Diagnosis not present

## 2012-09-21 DIAGNOSIS — R4182 Altered mental status, unspecified: Secondary | ICD-10-CM | POA: Diagnosis not present

## 2012-09-21 DIAGNOSIS — Z9181 History of falling: Secondary | ICD-10-CM | POA: Diagnosis not present

## 2012-09-21 DIAGNOSIS — F339 Major depressive disorder, recurrent, unspecified: Secondary | ICD-10-CM | POA: Diagnosis not present

## 2012-09-21 DIAGNOSIS — G40909 Epilepsy, unspecified, not intractable, without status epilepticus: Secondary | ICD-10-CM | POA: Diagnosis not present

## 2012-09-21 DIAGNOSIS — E119 Type 2 diabetes mellitus without complications: Secondary | ICD-10-CM | POA: Diagnosis not present

## 2012-09-21 DIAGNOSIS — F411 Generalized anxiety disorder: Secondary | ICD-10-CM | POA: Diagnosis not present

## 2012-09-21 DIAGNOSIS — T148XXA Other injury of unspecified body region, initial encounter: Secondary | ICD-10-CM | POA: Diagnosis not present

## 2012-09-21 DIAGNOSIS — I4891 Unspecified atrial fibrillation: Secondary | ICD-10-CM | POA: Diagnosis not present

## 2012-09-21 DIAGNOSIS — F329 Major depressive disorder, single episode, unspecified: Secondary | ICD-10-CM | POA: Diagnosis not present

## 2012-09-21 DIAGNOSIS — IMO0001 Reserved for inherently not codable concepts without codable children: Secondary | ICD-10-CM | POA: Diagnosis not present

## 2012-09-23 DIAGNOSIS — IMO0001 Reserved for inherently not codable concepts without codable children: Secondary | ICD-10-CM | POA: Diagnosis not present

## 2012-09-23 DIAGNOSIS — S7000XA Contusion of unspecified hip, initial encounter: Secondary | ICD-10-CM | POA: Diagnosis not present

## 2012-09-23 DIAGNOSIS — D649 Anemia, unspecified: Secondary | ICD-10-CM | POA: Diagnosis not present

## 2012-09-23 DIAGNOSIS — F329 Major depressive disorder, single episode, unspecified: Secondary | ICD-10-CM | POA: Diagnosis not present

## 2012-09-28 DIAGNOSIS — F332 Major depressive disorder, recurrent severe without psychotic features: Secondary | ICD-10-CM | POA: Diagnosis not present

## 2012-10-09 DIAGNOSIS — IMO0001 Reserved for inherently not codable concepts without codable children: Secondary | ICD-10-CM | POA: Diagnosis not present

## 2012-10-09 DIAGNOSIS — D649 Anemia, unspecified: Secondary | ICD-10-CM | POA: Diagnosis not present

## 2012-10-09 DIAGNOSIS — T148XXA Other injury of unspecified body region, initial encounter: Secondary | ICD-10-CM | POA: Diagnosis not present

## 2012-10-09 DIAGNOSIS — I10 Essential (primary) hypertension: Secondary | ICD-10-CM | POA: Diagnosis not present

## 2012-10-11 DIAGNOSIS — F332 Major depressive disorder, recurrent severe without psychotic features: Secondary | ICD-10-CM | POA: Diagnosis not present

## 2012-10-11 DIAGNOSIS — F411 Generalized anxiety disorder: Secondary | ICD-10-CM | POA: Diagnosis not present

## 2012-10-21 DIAGNOSIS — IMO0001 Reserved for inherently not codable concepts without codable children: Secondary | ICD-10-CM | POA: Diagnosis not present

## 2012-10-21 DIAGNOSIS — F329 Major depressive disorder, single episode, unspecified: Secondary | ICD-10-CM | POA: Diagnosis not present

## 2012-10-21 DIAGNOSIS — I4891 Unspecified atrial fibrillation: Secondary | ICD-10-CM | POA: Diagnosis not present

## 2012-10-28 DIAGNOSIS — E119 Type 2 diabetes mellitus without complications: Secondary | ICD-10-CM | POA: Diagnosis not present

## 2012-10-28 DIAGNOSIS — E319 Polyglandular dysfunction, unspecified: Secondary | ICD-10-CM | POA: Diagnosis not present

## 2012-10-28 DIAGNOSIS — I10 Essential (primary) hypertension: Secondary | ICD-10-CM | POA: Diagnosis not present

## 2012-10-28 DIAGNOSIS — G9340 Encephalopathy, unspecified: Secondary | ICD-10-CM | POA: Diagnosis not present

## 2012-11-15 DIAGNOSIS — F411 Generalized anxiety disorder: Secondary | ICD-10-CM | POA: Diagnosis not present

## 2012-11-15 DIAGNOSIS — F332 Major depressive disorder, recurrent severe without psychotic features: Secondary | ICD-10-CM | POA: Diagnosis not present

## 2012-11-17 DIAGNOSIS — Z7901 Long term (current) use of anticoagulants: Secondary | ICD-10-CM | POA: Diagnosis not present

## 2012-11-17 DIAGNOSIS — Z09 Encounter for follow-up examination after completed treatment for conditions other than malignant neoplasm: Secondary | ICD-10-CM | POA: Diagnosis not present

## 2012-11-17 DIAGNOSIS — I10 Essential (primary) hypertension: Secondary | ICD-10-CM | POA: Diagnosis not present

## 2012-11-17 DIAGNOSIS — R0602 Shortness of breath: Secondary | ICD-10-CM | POA: Diagnosis not present

## 2012-11-17 DIAGNOSIS — I4891 Unspecified atrial fibrillation: Secondary | ICD-10-CM | POA: Diagnosis not present

## 2012-11-18 DIAGNOSIS — R0989 Other specified symptoms and signs involving the circulatory and respiratory systems: Secondary | ICD-10-CM | POA: Diagnosis not present

## 2012-11-18 DIAGNOSIS — I4891 Unspecified atrial fibrillation: Secondary | ICD-10-CM | POA: Diagnosis not present

## 2012-11-18 DIAGNOSIS — R0609 Other forms of dyspnea: Secondary | ICD-10-CM | POA: Diagnosis not present

## 2012-11-18 DIAGNOSIS — R0602 Shortness of breath: Secondary | ICD-10-CM | POA: Diagnosis not present

## 2012-11-25 DIAGNOSIS — I4891 Unspecified atrial fibrillation: Secondary | ICD-10-CM | POA: Diagnosis not present

## 2012-11-25 DIAGNOSIS — R0602 Shortness of breath: Secondary | ICD-10-CM | POA: Diagnosis not present

## 2012-12-08 DIAGNOSIS — W57XXXA Bitten or stung by nonvenomous insect and other nonvenomous arthropods, initial encounter: Secondary | ICD-10-CM | POA: Diagnosis not present

## 2012-12-08 DIAGNOSIS — E119 Type 2 diabetes mellitus without complications: Secondary | ICD-10-CM | POA: Diagnosis not present

## 2012-12-08 DIAGNOSIS — IMO0001 Reserved for inherently not codable concepts without codable children: Secondary | ICD-10-CM | POA: Diagnosis not present

## 2012-12-08 DIAGNOSIS — T148 Other injury of unspecified body region: Secondary | ICD-10-CM | POA: Diagnosis not present

## 2012-12-09 DIAGNOSIS — M5126 Other intervertebral disc displacement, lumbar region: Secondary | ICD-10-CM | POA: Diagnosis not present

## 2012-12-14 DIAGNOSIS — M1610 Unilateral primary osteoarthritis, unspecified hip: Secondary | ICD-10-CM | POA: Diagnosis not present

## 2012-12-14 DIAGNOSIS — M25559 Pain in unspecified hip: Secondary | ICD-10-CM | POA: Diagnosis not present

## 2012-12-14 DIAGNOSIS — M169 Osteoarthritis of hip, unspecified: Secondary | ICD-10-CM | POA: Diagnosis not present

## 2012-12-20 DIAGNOSIS — M169 Osteoarthritis of hip, unspecified: Secondary | ICD-10-CM | POA: Diagnosis not present

## 2012-12-20 DIAGNOSIS — M1610 Unilateral primary osteoarthritis, unspecified hip: Secondary | ICD-10-CM | POA: Diagnosis not present

## 2012-12-28 DIAGNOSIS — E782 Mixed hyperlipidemia: Secondary | ICD-10-CM | POA: Diagnosis not present

## 2012-12-28 DIAGNOSIS — IMO0002 Reserved for concepts with insufficient information to code with codable children: Secondary | ICD-10-CM | POA: Diagnosis not present

## 2012-12-28 DIAGNOSIS — Z79899 Other long term (current) drug therapy: Secondary | ICD-10-CM | POA: Diagnosis not present

## 2012-12-28 DIAGNOSIS — D649 Anemia, unspecified: Secondary | ICD-10-CM | POA: Diagnosis not present

## 2012-12-28 DIAGNOSIS — E1065 Type 1 diabetes mellitus with hyperglycemia: Secondary | ICD-10-CM | POA: Diagnosis not present

## 2012-12-28 DIAGNOSIS — E559 Vitamin D deficiency, unspecified: Secondary | ICD-10-CM | POA: Diagnosis not present

## 2013-01-10 DIAGNOSIS — M48061 Spinal stenosis, lumbar region without neurogenic claudication: Secondary | ICD-10-CM | POA: Diagnosis not present

## 2013-01-10 DIAGNOSIS — IMO0002 Reserved for concepts with insufficient information to code with codable children: Secondary | ICD-10-CM | POA: Diagnosis not present

## 2013-01-17 DIAGNOSIS — M1710 Unilateral primary osteoarthritis, unspecified knee: Secondary | ICD-10-CM | POA: Diagnosis not present

## 2013-01-17 DIAGNOSIS — M48061 Spinal stenosis, lumbar region without neurogenic claudication: Secondary | ICD-10-CM | POA: Diagnosis not present

## 2013-01-17 DIAGNOSIS — M1610 Unilateral primary osteoarthritis, unspecified hip: Secondary | ICD-10-CM | POA: Diagnosis not present

## 2013-01-17 DIAGNOSIS — M169 Osteoarthritis of hip, unspecified: Secondary | ICD-10-CM | POA: Diagnosis not present

## 2013-01-25 DIAGNOSIS — M1610 Unilateral primary osteoarthritis, unspecified hip: Secondary | ICD-10-CM | POA: Diagnosis not present

## 2013-01-25 DIAGNOSIS — F411 Generalized anxiety disorder: Secondary | ICD-10-CM | POA: Diagnosis not present

## 2013-01-25 DIAGNOSIS — M169 Osteoarthritis of hip, unspecified: Secondary | ICD-10-CM | POA: Diagnosis not present

## 2013-01-25 DIAGNOSIS — Z789 Other specified health status: Secondary | ICD-10-CM | POA: Diagnosis not present

## 2013-01-25 DIAGNOSIS — M25559 Pain in unspecified hip: Secondary | ICD-10-CM | POA: Diagnosis not present

## 2013-01-25 DIAGNOSIS — F332 Major depressive disorder, recurrent severe without psychotic features: Secondary | ICD-10-CM | POA: Diagnosis not present

## 2013-02-21 DIAGNOSIS — F332 Major depressive disorder, recurrent severe without psychotic features: Secondary | ICD-10-CM | POA: Diagnosis not present

## 2013-03-03 HISTORY — PX: TOTAL HIP ARTHROPLASTY: SHX124

## 2013-03-03 HISTORY — PX: TOTAL HIP REVISION: SHX763

## 2013-03-21 DIAGNOSIS — Z789 Other specified health status: Secondary | ICD-10-CM | POA: Diagnosis not present

## 2013-03-21 DIAGNOSIS — F411 Generalized anxiety disorder: Secondary | ICD-10-CM | POA: Diagnosis not present

## 2013-03-21 DIAGNOSIS — F332 Major depressive disorder, recurrent severe without psychotic features: Secondary | ICD-10-CM | POA: Diagnosis not present

## 2013-03-23 DIAGNOSIS — I4891 Unspecified atrial fibrillation: Secondary | ICD-10-CM | POA: Diagnosis not present

## 2013-03-23 DIAGNOSIS — R42 Dizziness and giddiness: Secondary | ICD-10-CM | POA: Diagnosis not present

## 2013-03-23 DIAGNOSIS — R0602 Shortness of breath: Secondary | ICD-10-CM | POA: Diagnosis not present

## 2013-03-23 DIAGNOSIS — I10 Essential (primary) hypertension: Secondary | ICD-10-CM | POA: Diagnosis not present

## 2013-03-24 DIAGNOSIS — M25559 Pain in unspecified hip: Secondary | ICD-10-CM | POA: Diagnosis not present

## 2013-03-24 DIAGNOSIS — M1610 Unilateral primary osteoarthritis, unspecified hip: Secondary | ICD-10-CM | POA: Diagnosis not present

## 2013-03-24 DIAGNOSIS — M169 Osteoarthritis of hip, unspecified: Secondary | ICD-10-CM | POA: Diagnosis not present

## 2013-03-30 DIAGNOSIS — E1065 Type 1 diabetes mellitus with hyperglycemia: Secondary | ICD-10-CM | POA: Diagnosis not present

## 2013-03-30 DIAGNOSIS — IMO0002 Reserved for concepts with insufficient information to code with codable children: Secondary | ICD-10-CM | POA: Diagnosis not present

## 2013-03-30 DIAGNOSIS — E559 Vitamin D deficiency, unspecified: Secondary | ICD-10-CM | POA: Diagnosis not present

## 2013-03-30 DIAGNOSIS — E782 Mixed hyperlipidemia: Secondary | ICD-10-CM | POA: Diagnosis not present

## 2013-03-30 DIAGNOSIS — D649 Anemia, unspecified: Secondary | ICD-10-CM | POA: Diagnosis not present

## 2013-03-30 DIAGNOSIS — Z79899 Other long term (current) drug therapy: Secondary | ICD-10-CM | POA: Diagnosis not present

## 2013-04-05 DIAGNOSIS — N3941 Urge incontinence: Secondary | ICD-10-CM | POA: Diagnosis not present

## 2013-04-05 DIAGNOSIS — N393 Stress incontinence (female) (male): Secondary | ICD-10-CM | POA: Diagnosis not present

## 2013-04-14 DIAGNOSIS — R609 Edema, unspecified: Secondary | ICD-10-CM | POA: Diagnosis not present

## 2013-04-14 DIAGNOSIS — R42 Dizziness and giddiness: Secondary | ICD-10-CM | POA: Diagnosis not present

## 2013-04-14 DIAGNOSIS — R0602 Shortness of breath: Secondary | ICD-10-CM | POA: Diagnosis not present

## 2013-04-14 DIAGNOSIS — I4891 Unspecified atrial fibrillation: Secondary | ICD-10-CM | POA: Diagnosis not present

## 2013-04-14 DIAGNOSIS — M79609 Pain in unspecified limb: Secondary | ICD-10-CM | POA: Diagnosis not present

## 2013-04-18 DIAGNOSIS — IMO0002 Reserved for concepts with insufficient information to code with codable children: Secondary | ICD-10-CM | POA: Diagnosis not present

## 2013-04-18 DIAGNOSIS — E119 Type 2 diabetes mellitus without complications: Secondary | ICD-10-CM | POA: Diagnosis not present

## 2013-04-18 DIAGNOSIS — I509 Heart failure, unspecified: Secondary | ICD-10-CM | POA: Diagnosis not present

## 2013-04-18 DIAGNOSIS — G4710 Hypersomnia, unspecified: Secondary | ICD-10-CM | POA: Diagnosis not present

## 2013-04-18 DIAGNOSIS — R609 Edema, unspecified: Secondary | ICD-10-CM | POA: Diagnosis not present

## 2013-05-03 DIAGNOSIS — R0902 Hypoxemia: Secondary | ICD-10-CM | POA: Diagnosis not present

## 2013-05-03 DIAGNOSIS — G4733 Obstructive sleep apnea (adult) (pediatric): Secondary | ICD-10-CM | POA: Diagnosis not present

## 2013-05-05 DIAGNOSIS — N393 Stress incontinence (female) (male): Secondary | ICD-10-CM | POA: Diagnosis not present

## 2013-05-12 DIAGNOSIS — I779 Disorder of arteries and arterioles, unspecified: Secondary | ICD-10-CM | POA: Diagnosis not present

## 2013-05-12 DIAGNOSIS — R252 Cramp and spasm: Secondary | ICD-10-CM | POA: Diagnosis not present

## 2013-05-12 DIAGNOSIS — R079 Chest pain, unspecified: Secondary | ICD-10-CM | POA: Diagnosis not present

## 2013-05-12 DIAGNOSIS — R0602 Shortness of breath: Secondary | ICD-10-CM | POA: Diagnosis not present

## 2013-05-17 DIAGNOSIS — G4733 Obstructive sleep apnea (adult) (pediatric): Secondary | ICD-10-CM | POA: Diagnosis not present

## 2013-05-17 DIAGNOSIS — I4891 Unspecified atrial fibrillation: Secondary | ICD-10-CM | POA: Diagnosis not present

## 2013-05-17 DIAGNOSIS — I10 Essential (primary) hypertension: Secondary | ICD-10-CM | POA: Diagnosis not present

## 2013-05-17 DIAGNOSIS — I509 Heart failure, unspecified: Secondary | ICD-10-CM | POA: Diagnosis not present

## 2013-05-26 DIAGNOSIS — N393 Stress incontinence (female) (male): Secondary | ICD-10-CM | POA: Diagnosis not present

## 2013-06-01 DIAGNOSIS — G4733 Obstructive sleep apnea (adult) (pediatric): Secondary | ICD-10-CM | POA: Diagnosis not present

## 2013-06-01 DIAGNOSIS — G4720 Circadian rhythm sleep disorder, unspecified type: Secondary | ICD-10-CM | POA: Diagnosis not present

## 2013-06-01 DIAGNOSIS — R0902 Hypoxemia: Secondary | ICD-10-CM | POA: Diagnosis not present

## 2013-06-13 DIAGNOSIS — D649 Anemia, unspecified: Secondary | ICD-10-CM | POA: Diagnosis not present

## 2013-06-13 DIAGNOSIS — E1065 Type 1 diabetes mellitus with hyperglycemia: Secondary | ICD-10-CM | POA: Diagnosis not present

## 2013-06-13 DIAGNOSIS — Z79899 Other long term (current) drug therapy: Secondary | ICD-10-CM | POA: Diagnosis not present

## 2013-06-13 DIAGNOSIS — E782 Mixed hyperlipidemia: Secondary | ICD-10-CM | POA: Diagnosis not present

## 2013-06-13 DIAGNOSIS — M25559 Pain in unspecified hip: Secondary | ICD-10-CM | POA: Diagnosis not present

## 2013-06-13 DIAGNOSIS — E559 Vitamin D deficiency, unspecified: Secondary | ICD-10-CM | POA: Diagnosis not present

## 2013-06-13 DIAGNOSIS — IMO0002 Reserved for concepts with insufficient information to code with codable children: Secondary | ICD-10-CM | POA: Diagnosis not present

## 2013-06-16 DIAGNOSIS — F411 Generalized anxiety disorder: Secondary | ICD-10-CM | POA: Diagnosis not present

## 2013-06-16 DIAGNOSIS — Z789 Other specified health status: Secondary | ICD-10-CM | POA: Diagnosis not present

## 2013-06-16 DIAGNOSIS — F332 Major depressive disorder, recurrent severe without psychotic features: Secondary | ICD-10-CM | POA: Diagnosis not present

## 2013-06-23 DIAGNOSIS — G4733 Obstructive sleep apnea (adult) (pediatric): Secondary | ICD-10-CM | POA: Diagnosis not present

## 2013-06-23 DIAGNOSIS — I4891 Unspecified atrial fibrillation: Secondary | ICD-10-CM | POA: Diagnosis not present

## 2013-06-23 DIAGNOSIS — I10 Essential (primary) hypertension: Secondary | ICD-10-CM | POA: Diagnosis not present

## 2013-06-23 DIAGNOSIS — I509 Heart failure, unspecified: Secondary | ICD-10-CM | POA: Diagnosis not present

## 2013-06-30 DIAGNOSIS — E119 Type 2 diabetes mellitus without complications: Secondary | ICD-10-CM | POA: Diagnosis not present

## 2013-06-30 DIAGNOSIS — M1610 Unilateral primary osteoarthritis, unspecified hip: Secondary | ICD-10-CM | POA: Diagnosis not present

## 2013-06-30 DIAGNOSIS — I4891 Unspecified atrial fibrillation: Secondary | ICD-10-CM | POA: Diagnosis not present

## 2013-06-30 DIAGNOSIS — I10 Essential (primary) hypertension: Secondary | ICD-10-CM | POA: Diagnosis not present

## 2013-06-30 DIAGNOSIS — M25559 Pain in unspecified hip: Secondary | ICD-10-CM | POA: Diagnosis not present

## 2013-06-30 DIAGNOSIS — I509 Heart failure, unspecified: Secondary | ICD-10-CM | POA: Diagnosis not present

## 2013-06-30 DIAGNOSIS — M169 Osteoarthritis of hip, unspecified: Secondary | ICD-10-CM | POA: Diagnosis not present

## 2013-06-30 DIAGNOSIS — Z79899 Other long term (current) drug therapy: Secondary | ICD-10-CM | POA: Diagnosis not present

## 2013-07-06 DIAGNOSIS — I4891 Unspecified atrial fibrillation: Secondary | ICD-10-CM | POA: Diagnosis not present

## 2013-07-06 DIAGNOSIS — Z01818 Encounter for other preprocedural examination: Secondary | ICD-10-CM | POA: Diagnosis not present

## 2013-07-06 DIAGNOSIS — G4733 Obstructive sleep apnea (adult) (pediatric): Secondary | ICD-10-CM | POA: Diagnosis not present

## 2013-07-06 DIAGNOSIS — I779 Disorder of arteries and arterioles, unspecified: Secondary | ICD-10-CM | POA: Diagnosis not present

## 2013-07-06 DIAGNOSIS — R0602 Shortness of breath: Secondary | ICD-10-CM | POA: Diagnosis not present

## 2013-07-12 DIAGNOSIS — E1065 Type 1 diabetes mellitus with hyperglycemia: Secondary | ICD-10-CM | POA: Diagnosis not present

## 2013-07-12 DIAGNOSIS — M1990 Unspecified osteoarthritis, unspecified site: Secondary | ICD-10-CM | POA: Diagnosis not present

## 2013-07-12 DIAGNOSIS — E559 Vitamin D deficiency, unspecified: Secondary | ICD-10-CM | POA: Diagnosis not present

## 2013-07-12 DIAGNOSIS — IMO0002 Reserved for concepts with insufficient information to code with codable children: Secondary | ICD-10-CM | POA: Diagnosis not present

## 2013-07-12 DIAGNOSIS — D649 Anemia, unspecified: Secondary | ICD-10-CM | POA: Diagnosis not present

## 2013-07-18 DIAGNOSIS — H2510 Age-related nuclear cataract, unspecified eye: Secondary | ICD-10-CM | POA: Diagnosis not present

## 2013-07-18 DIAGNOSIS — H04129 Dry eye syndrome of unspecified lacrimal gland: Secondary | ICD-10-CM | POA: Diagnosis not present

## 2013-07-18 DIAGNOSIS — H43819 Vitreous degeneration, unspecified eye: Secondary | ICD-10-CM | POA: Diagnosis not present

## 2013-07-18 DIAGNOSIS — H40039 Anatomical narrow angle, unspecified eye: Secondary | ICD-10-CM | POA: Diagnosis not present

## 2013-07-18 DIAGNOSIS — E119 Type 2 diabetes mellitus without complications: Secondary | ICD-10-CM | POA: Diagnosis not present

## 2013-07-21 DIAGNOSIS — Z01818 Encounter for other preprocedural examination: Secondary | ICD-10-CM | POA: Diagnosis not present

## 2013-07-21 DIAGNOSIS — E119 Type 2 diabetes mellitus without complications: Secondary | ICD-10-CM | POA: Diagnosis not present

## 2013-07-21 DIAGNOSIS — R079 Chest pain, unspecified: Secondary | ICD-10-CM | POA: Diagnosis not present

## 2013-08-01 DIAGNOSIS — R079 Chest pain, unspecified: Secondary | ICD-10-CM | POA: Diagnosis not present

## 2013-08-01 DIAGNOSIS — I10 Essential (primary) hypertension: Secondary | ICD-10-CM | POA: Diagnosis not present

## 2013-08-09 DIAGNOSIS — IMO0002 Reserved for concepts with insufficient information to code with codable children: Secondary | ICD-10-CM | POA: Diagnosis not present

## 2013-08-09 DIAGNOSIS — I10 Essential (primary) hypertension: Secondary | ICD-10-CM | POA: Diagnosis not present

## 2013-08-09 DIAGNOSIS — E1065 Type 1 diabetes mellitus with hyperglycemia: Secondary | ICD-10-CM | POA: Diagnosis not present

## 2013-08-11 DIAGNOSIS — I10 Essential (primary) hypertension: Secondary | ICD-10-CM | POA: Diagnosis not present

## 2013-08-11 DIAGNOSIS — I509 Heart failure, unspecified: Secondary | ICD-10-CM | POA: Diagnosis not present

## 2013-08-11 DIAGNOSIS — G4733 Obstructive sleep apnea (adult) (pediatric): Secondary | ICD-10-CM | POA: Diagnosis not present

## 2013-08-11 DIAGNOSIS — I4891 Unspecified atrial fibrillation: Secondary | ICD-10-CM | POA: Diagnosis not present

## 2013-09-06 DIAGNOSIS — R0602 Shortness of breath: Secondary | ICD-10-CM | POA: Diagnosis not present

## 2013-09-06 DIAGNOSIS — I10 Essential (primary) hypertension: Secondary | ICD-10-CM | POA: Diagnosis not present

## 2013-09-06 DIAGNOSIS — I4891 Unspecified atrial fibrillation: Secondary | ICD-10-CM | POA: Diagnosis not present

## 2013-09-09 DIAGNOSIS — Z79899 Other long term (current) drug therapy: Secondary | ICD-10-CM | POA: Diagnosis not present

## 2013-09-09 DIAGNOSIS — E559 Vitamin D deficiency, unspecified: Secondary | ICD-10-CM | POA: Diagnosis not present

## 2013-09-09 DIAGNOSIS — D649 Anemia, unspecified: Secondary | ICD-10-CM | POA: Diagnosis not present

## 2013-09-09 DIAGNOSIS — E782 Mixed hyperlipidemia: Secondary | ICD-10-CM | POA: Diagnosis not present

## 2013-09-09 DIAGNOSIS — IMO0002 Reserved for concepts with insufficient information to code with codable children: Secondary | ICD-10-CM | POA: Diagnosis not present

## 2013-09-09 DIAGNOSIS — E1065 Type 1 diabetes mellitus with hyperglycemia: Secondary | ICD-10-CM | POA: Diagnosis not present

## 2013-09-29 DIAGNOSIS — I509 Heart failure, unspecified: Secondary | ICD-10-CM | POA: Diagnosis not present

## 2013-09-29 DIAGNOSIS — I10 Essential (primary) hypertension: Secondary | ICD-10-CM | POA: Diagnosis not present

## 2013-09-29 DIAGNOSIS — M1610 Unilateral primary osteoarthritis, unspecified hip: Secondary | ICD-10-CM | POA: Diagnosis not present

## 2013-09-29 DIAGNOSIS — I4891 Unspecified atrial fibrillation: Secondary | ICD-10-CM | POA: Diagnosis not present

## 2013-09-29 DIAGNOSIS — Z96649 Presence of unspecified artificial hip joint: Secondary | ICD-10-CM | POA: Diagnosis not present

## 2013-09-29 DIAGNOSIS — Z471 Aftercare following joint replacement surgery: Secondary | ICD-10-CM | POA: Diagnosis not present

## 2013-09-29 DIAGNOSIS — E119 Type 2 diabetes mellitus without complications: Secondary | ICD-10-CM | POA: Diagnosis not present

## 2013-10-05 DIAGNOSIS — E119 Type 2 diabetes mellitus without complications: Secondary | ICD-10-CM | POA: Diagnosis not present

## 2013-10-05 DIAGNOSIS — M1990 Unspecified osteoarthritis, unspecified site: Secondary | ICD-10-CM | POA: Diagnosis not present

## 2013-10-06 DIAGNOSIS — M1610 Unilateral primary osteoarthritis, unspecified hip: Secondary | ICD-10-CM | POA: Diagnosis not present

## 2013-10-06 DIAGNOSIS — R262 Difficulty in walking, not elsewhere classified: Secondary | ICD-10-CM | POA: Diagnosis not present

## 2013-10-14 DIAGNOSIS — D481 Neoplasm of uncertain behavior of connective and other soft tissue: Secondary | ICD-10-CM | POA: Diagnosis not present

## 2013-10-14 DIAGNOSIS — I517 Cardiomegaly: Secondary | ICD-10-CM | POA: Diagnosis not present

## 2013-10-14 DIAGNOSIS — I509 Heart failure, unspecified: Secondary | ICD-10-CM | POA: Diagnosis not present

## 2013-10-14 DIAGNOSIS — M25559 Pain in unspecified hip: Secondary | ICD-10-CM | POA: Diagnosis not present

## 2013-10-14 DIAGNOSIS — D62 Acute posthemorrhagic anemia: Secondary | ICD-10-CM | POA: Diagnosis not present

## 2013-10-14 DIAGNOSIS — IMO0001 Reserved for inherently not codable concepts without codable children: Secondary | ICD-10-CM | POA: Diagnosis present

## 2013-10-14 DIAGNOSIS — E1149 Type 2 diabetes mellitus with other diabetic neurological complication: Secondary | ICD-10-CM | POA: Diagnosis present

## 2013-10-14 DIAGNOSIS — Z5189 Encounter for other specified aftercare: Secondary | ICD-10-CM | POA: Diagnosis not present

## 2013-10-14 DIAGNOSIS — G4733 Obstructive sleep apnea (adult) (pediatric): Secondary | ICD-10-CM | POA: Diagnosis not present

## 2013-10-14 DIAGNOSIS — G2581 Restless legs syndrome: Secondary | ICD-10-CM | POA: Diagnosis present

## 2013-10-14 DIAGNOSIS — F341 Dysthymic disorder: Secondary | ICD-10-CM | POA: Diagnosis not present

## 2013-10-14 DIAGNOSIS — M169 Osteoarthritis of hip, unspecified: Secondary | ICD-10-CM | POA: Diagnosis not present

## 2013-10-14 DIAGNOSIS — I359 Nonrheumatic aortic valve disorder, unspecified: Secondary | ICD-10-CM | POA: Diagnosis present

## 2013-10-14 DIAGNOSIS — I10 Essential (primary) hypertension: Secondary | ICD-10-CM | POA: Diagnosis not present

## 2013-10-14 DIAGNOSIS — E1142 Type 2 diabetes mellitus with diabetic polyneuropathy: Secondary | ICD-10-CM | POA: Diagnosis present

## 2013-10-14 DIAGNOSIS — Z471 Aftercare following joint replacement surgery: Secondary | ICD-10-CM | POA: Diagnosis not present

## 2013-10-14 DIAGNOSIS — G8918 Other acute postprocedural pain: Secondary | ICD-10-CM | POA: Diagnosis not present

## 2013-10-14 DIAGNOSIS — E119 Type 2 diabetes mellitus without complications: Secondary | ICD-10-CM | POA: Diagnosis not present

## 2013-10-14 DIAGNOSIS — E869 Volume depletion, unspecified: Secondary | ICD-10-CM | POA: Diagnosis not present

## 2013-10-14 DIAGNOSIS — Z794 Long term (current) use of insulin: Secondary | ICD-10-CM | POA: Diagnosis not present

## 2013-10-14 DIAGNOSIS — M62838 Other muscle spasm: Secondary | ICD-10-CM | POA: Diagnosis not present

## 2013-10-14 DIAGNOSIS — Z96649 Presence of unspecified artificial hip joint: Secondary | ICD-10-CM | POA: Diagnosis not present

## 2013-10-14 DIAGNOSIS — Z6837 Body mass index (BMI) 37.0-37.9, adult: Secondary | ICD-10-CM | POA: Diagnosis not present

## 2013-10-14 DIAGNOSIS — M1610 Unilateral primary osteoarthritis, unspecified hip: Secondary | ICD-10-CM | POA: Diagnosis not present

## 2013-10-14 DIAGNOSIS — I4891 Unspecified atrial fibrillation: Secondary | ICD-10-CM | POA: Diagnosis not present

## 2013-10-18 DIAGNOSIS — Z6838 Body mass index (BMI) 38.0-38.9, adult: Secondary | ICD-10-CM | POA: Diagnosis not present

## 2013-10-18 DIAGNOSIS — Z5189 Encounter for other specified aftercare: Secondary | ICD-10-CM | POA: Diagnosis not present

## 2013-10-18 DIAGNOSIS — T8450XA Infection and inflammatory reaction due to unspecified internal joint prosthesis, initial encounter: Secondary | ICD-10-CM | POA: Diagnosis not present

## 2013-10-18 DIAGNOSIS — Z472 Encounter for removal of internal fixation device: Secondary | ICD-10-CM | POA: Diagnosis not present

## 2013-10-18 DIAGNOSIS — E119 Type 2 diabetes mellitus without complications: Secondary | ICD-10-CM | POA: Diagnosis not present

## 2013-10-18 DIAGNOSIS — A4189 Other specified sepsis: Secondary | ICD-10-CM | POA: Diagnosis not present

## 2013-10-18 DIAGNOSIS — Z794 Long term (current) use of insulin: Secondary | ICD-10-CM | POA: Diagnosis not present

## 2013-10-18 DIAGNOSIS — IMO0001 Reserved for inherently not codable concepts without codable children: Secondary | ICD-10-CM | POA: Diagnosis not present

## 2013-10-18 DIAGNOSIS — T8489XA Other specified complication of internal orthopedic prosthetic devices, implants and grafts, initial encounter: Secondary | ICD-10-CM | POA: Diagnosis present

## 2013-10-18 DIAGNOSIS — I509 Heart failure, unspecified: Secondary | ICD-10-CM | POA: Diagnosis not present

## 2013-10-18 DIAGNOSIS — T84029A Dislocation of unspecified internal joint prosthesis, initial encounter: Secondary | ICD-10-CM | POA: Diagnosis present

## 2013-10-18 DIAGNOSIS — Z7901 Long term (current) use of anticoagulants: Secondary | ICD-10-CM | POA: Diagnosis not present

## 2013-10-18 DIAGNOSIS — G4733 Obstructive sleep apnea (adult) (pediatric): Secondary | ICD-10-CM | POA: Diagnosis not present

## 2013-10-18 DIAGNOSIS — D62 Acute posthemorrhagic anemia: Secondary | ICD-10-CM | POA: Diagnosis not present

## 2013-10-18 DIAGNOSIS — G2581 Restless legs syndrome: Secondary | ICD-10-CM | POA: Diagnosis present

## 2013-10-18 DIAGNOSIS — Z96649 Presence of unspecified artificial hip joint: Secondary | ICD-10-CM | POA: Diagnosis not present

## 2013-10-18 DIAGNOSIS — A419 Sepsis, unspecified organism: Secondary | ICD-10-CM | POA: Diagnosis not present

## 2013-10-18 DIAGNOSIS — Z471 Aftercare following joint replacement surgery: Secondary | ICD-10-CM | POA: Diagnosis not present

## 2013-10-18 DIAGNOSIS — T8140XA Infection following a procedure, unspecified, initial encounter: Secondary | ICD-10-CM | POA: Diagnosis not present

## 2013-10-18 DIAGNOSIS — I10 Essential (primary) hypertension: Secondary | ICD-10-CM | POA: Diagnosis not present

## 2013-10-18 DIAGNOSIS — I4891 Unspecified atrial fibrillation: Secondary | ICD-10-CM | POA: Diagnosis not present

## 2013-10-18 DIAGNOSIS — I959 Hypotension, unspecified: Secondary | ICD-10-CM | POA: Diagnosis not present

## 2013-10-21 DIAGNOSIS — Z471 Aftercare following joint replacement surgery: Secondary | ICD-10-CM | POA: Diagnosis not present

## 2013-10-21 DIAGNOSIS — E119 Type 2 diabetes mellitus without complications: Secondary | ICD-10-CM | POA: Diagnosis not present

## 2013-10-23 DIAGNOSIS — I509 Heart failure, unspecified: Secondary | ICD-10-CM | POA: Diagnosis not present

## 2013-10-23 DIAGNOSIS — I4891 Unspecified atrial fibrillation: Secondary | ICD-10-CM | POA: Diagnosis not present

## 2013-10-23 DIAGNOSIS — I10 Essential (primary) hypertension: Secondary | ICD-10-CM | POA: Diagnosis not present

## 2013-10-23 DIAGNOSIS — IMO0001 Reserved for inherently not codable concepts without codable children: Secondary | ICD-10-CM | POA: Diagnosis not present

## 2013-10-23 DIAGNOSIS — Z471 Aftercare following joint replacement surgery: Secondary | ICD-10-CM | POA: Diagnosis not present

## 2013-10-23 DIAGNOSIS — T8140XA Infection following a procedure, unspecified, initial encounter: Secondary | ICD-10-CM | POA: Diagnosis not present

## 2013-10-25 DIAGNOSIS — Z5189 Encounter for other specified aftercare: Secondary | ICD-10-CM | POA: Diagnosis not present

## 2013-10-25 DIAGNOSIS — I4891 Unspecified atrial fibrillation: Secondary | ICD-10-CM | POA: Diagnosis not present

## 2013-10-25 DIAGNOSIS — I10 Essential (primary) hypertension: Secondary | ICD-10-CM | POA: Diagnosis not present

## 2013-10-25 DIAGNOSIS — Z452 Encounter for adjustment and management of vascular access device: Secondary | ICD-10-CM | POA: Diagnosis not present

## 2013-10-25 DIAGNOSIS — Z96649 Presence of unspecified artificial hip joint: Secondary | ICD-10-CM | POA: Diagnosis not present

## 2013-10-25 DIAGNOSIS — M7989 Other specified soft tissue disorders: Secondary | ICD-10-CM | POA: Diagnosis not present

## 2013-10-25 DIAGNOSIS — T8140XA Infection following a procedure, unspecified, initial encounter: Secondary | ICD-10-CM | POA: Diagnosis present

## 2013-10-25 DIAGNOSIS — T84029A Dislocation of unspecified internal joint prosthesis, initial encounter: Secondary | ICD-10-CM | POA: Diagnosis present

## 2013-10-25 DIAGNOSIS — Z472 Encounter for removal of internal fixation device: Secondary | ICD-10-CM | POA: Diagnosis not present

## 2013-10-25 DIAGNOSIS — Z6838 Body mass index (BMI) 38.0-38.9, adult: Secondary | ICD-10-CM | POA: Diagnosis not present

## 2013-10-25 DIAGNOSIS — A4189 Other specified sepsis: Secondary | ICD-10-CM | POA: Diagnosis not present

## 2013-10-25 DIAGNOSIS — I509 Heart failure, unspecified: Secondary | ICD-10-CM | POA: Diagnosis not present

## 2013-10-25 DIAGNOSIS — I959 Hypotension, unspecified: Secondary | ICD-10-CM | POA: Diagnosis not present

## 2013-10-25 DIAGNOSIS — IMO0001 Reserved for inherently not codable concepts without codable children: Secondary | ICD-10-CM | POA: Diagnosis not present

## 2013-10-25 DIAGNOSIS — M25559 Pain in unspecified hip: Secondary | ICD-10-CM | POA: Diagnosis not present

## 2013-10-25 DIAGNOSIS — T8489XA Other specified complication of internal orthopedic prosthetic devices, implants and grafts, initial encounter: Secondary | ICD-10-CM | POA: Diagnosis present

## 2013-10-25 DIAGNOSIS — T148XXA Other injury of unspecified body region, initial encounter: Secondary | ICD-10-CM | POA: Diagnosis not present

## 2013-10-25 DIAGNOSIS — D62 Acute posthemorrhagic anemia: Secondary | ICD-10-CM | POA: Diagnosis not present

## 2013-10-25 DIAGNOSIS — S7000XA Contusion of unspecified hip, initial encounter: Secondary | ICD-10-CM | POA: Diagnosis not present

## 2013-10-25 DIAGNOSIS — S72009A Fracture of unspecified part of neck of unspecified femur, initial encounter for closed fracture: Secondary | ICD-10-CM | POA: Diagnosis not present

## 2013-10-25 DIAGNOSIS — Z794 Long term (current) use of insulin: Secondary | ICD-10-CM | POA: Diagnosis not present

## 2013-10-25 DIAGNOSIS — J811 Chronic pulmonary edema: Secondary | ICD-10-CM | POA: Diagnosis not present

## 2013-10-25 DIAGNOSIS — G2581 Restless legs syndrome: Secondary | ICD-10-CM | POA: Diagnosis present

## 2013-10-25 DIAGNOSIS — D649 Anemia, unspecified: Secondary | ICD-10-CM | POA: Diagnosis not present

## 2013-10-25 DIAGNOSIS — R000 Tachycardia, unspecified: Secondary | ICD-10-CM | POA: Diagnosis not present

## 2013-10-25 DIAGNOSIS — R509 Fever, unspecified: Secondary | ICD-10-CM | POA: Diagnosis not present

## 2013-10-25 DIAGNOSIS — IMO0002 Reserved for concepts with insufficient information to code with codable children: Secondary | ICD-10-CM | POA: Diagnosis not present

## 2013-10-25 DIAGNOSIS — T8450XA Infection and inflammatory reaction due to unspecified internal joint prosthesis, initial encounter: Secondary | ICD-10-CM | POA: Diagnosis not present

## 2013-10-25 DIAGNOSIS — E119 Type 2 diabetes mellitus without complications: Secondary | ICD-10-CM | POA: Diagnosis not present

## 2013-10-25 DIAGNOSIS — A419 Sepsis, unspecified organism: Secondary | ICD-10-CM | POA: Diagnosis not present

## 2013-10-25 DIAGNOSIS — Z471 Aftercare following joint replacement surgery: Secondary | ICD-10-CM | POA: Diagnosis not present

## 2013-10-25 DIAGNOSIS — E871 Hypo-osmolality and hyponatremia: Secondary | ICD-10-CM | POA: Diagnosis not present

## 2013-10-25 DIAGNOSIS — Z7901 Long term (current) use of anticoagulants: Secondary | ICD-10-CM | POA: Diagnosis not present

## 2013-11-02 DIAGNOSIS — I4891 Unspecified atrial fibrillation: Secondary | ICD-10-CM | POA: Diagnosis not present

## 2013-11-02 DIAGNOSIS — A419 Sepsis, unspecified organism: Secondary | ICD-10-CM | POA: Diagnosis not present

## 2013-11-02 DIAGNOSIS — S72009A Fracture of unspecified part of neck of unspecified femur, initial encounter for closed fracture: Secondary | ICD-10-CM | POA: Diagnosis not present

## 2013-11-02 DIAGNOSIS — T148XXA Other injury of unspecified body region, initial encounter: Secondary | ICD-10-CM | POA: Diagnosis not present

## 2013-11-02 DIAGNOSIS — IMO0001 Reserved for inherently not codable concepts without codable children: Secondary | ICD-10-CM | POA: Diagnosis not present

## 2013-11-02 DIAGNOSIS — Z471 Aftercare following joint replacement surgery: Secondary | ICD-10-CM | POA: Diagnosis not present

## 2013-11-02 DIAGNOSIS — Z5189 Encounter for other specified aftercare: Secondary | ICD-10-CM | POA: Diagnosis not present

## 2013-11-02 DIAGNOSIS — D649 Anemia, unspecified: Secondary | ICD-10-CM | POA: Diagnosis not present

## 2013-11-02 DIAGNOSIS — G2581 Restless legs syndrome: Secondary | ICD-10-CM | POA: Diagnosis not present

## 2013-11-02 DIAGNOSIS — S7000XA Contusion of unspecified hip, initial encounter: Secondary | ICD-10-CM | POA: Diagnosis not present

## 2013-11-02 DIAGNOSIS — I10 Essential (primary) hypertension: Secondary | ICD-10-CM | POA: Diagnosis not present

## 2013-11-02 DIAGNOSIS — D62 Acute posthemorrhagic anemia: Secondary | ICD-10-CM | POA: Diagnosis not present

## 2013-11-02 DIAGNOSIS — M25559 Pain in unspecified hip: Secondary | ICD-10-CM | POA: Diagnosis not present

## 2013-11-02 DIAGNOSIS — Z7901 Long term (current) use of anticoagulants: Secondary | ICD-10-CM | POA: Diagnosis not present

## 2013-11-02 DIAGNOSIS — E119 Type 2 diabetes mellitus without complications: Secondary | ICD-10-CM | POA: Diagnosis not present

## 2013-11-03 DIAGNOSIS — I4891 Unspecified atrial fibrillation: Secondary | ICD-10-CM | POA: Diagnosis not present

## 2013-11-03 DIAGNOSIS — D649 Anemia, unspecified: Secondary | ICD-10-CM | POA: Diagnosis not present

## 2013-11-03 DIAGNOSIS — A419 Sepsis, unspecified organism: Secondary | ICD-10-CM | POA: Diagnosis not present

## 2013-11-03 DIAGNOSIS — I10 Essential (primary) hypertension: Secondary | ICD-10-CM | POA: Diagnosis not present

## 2013-11-03 DIAGNOSIS — IMO0001 Reserved for inherently not codable concepts without codable children: Secondary | ICD-10-CM | POA: Diagnosis not present

## 2013-11-03 DIAGNOSIS — Z471 Aftercare following joint replacement surgery: Secondary | ICD-10-CM | POA: Diagnosis not present

## 2013-11-05 DIAGNOSIS — G2581 Restless legs syndrome: Secondary | ICD-10-CM | POA: Diagnosis present

## 2013-11-05 DIAGNOSIS — I509 Heart failure, unspecified: Secondary | ICD-10-CM | POA: Diagnosis not present

## 2013-11-05 DIAGNOSIS — R112 Nausea with vomiting, unspecified: Secondary | ICD-10-CM | POA: Diagnosis not present

## 2013-11-05 DIAGNOSIS — R5381 Other malaise: Secondary | ICD-10-CM | POA: Diagnosis not present

## 2013-11-05 DIAGNOSIS — E119 Type 2 diabetes mellitus without complications: Secondary | ICD-10-CM | POA: Diagnosis not present

## 2013-11-05 DIAGNOSIS — I5033 Acute on chronic diastolic (congestive) heart failure: Secondary | ICD-10-CM | POA: Diagnosis not present

## 2013-11-05 DIAGNOSIS — T8450XA Infection and inflammatory reaction due to unspecified internal joint prosthesis, initial encounter: Secondary | ICD-10-CM | POA: Diagnosis not present

## 2013-11-05 DIAGNOSIS — Z794 Long term (current) use of insulin: Secondary | ICD-10-CM | POA: Diagnosis not present

## 2013-11-05 DIAGNOSIS — IMO0001 Reserved for inherently not codable concepts without codable children: Secondary | ICD-10-CM | POA: Diagnosis not present

## 2013-11-05 DIAGNOSIS — G252 Other specified forms of tremor: Secondary | ICD-10-CM | POA: Diagnosis present

## 2013-11-05 DIAGNOSIS — I359 Nonrheumatic aortic valve disorder, unspecified: Secondary | ICD-10-CM | POA: Diagnosis not present

## 2013-11-05 DIAGNOSIS — R0902 Hypoxemia: Secondary | ICD-10-CM | POA: Diagnosis not present

## 2013-11-05 DIAGNOSIS — D62 Acute posthemorrhagic anemia: Secondary | ICD-10-CM | POA: Diagnosis present

## 2013-11-05 DIAGNOSIS — L270 Generalized skin eruption due to drugs and medicaments taken internally: Secondary | ICD-10-CM | POA: Diagnosis not present

## 2013-11-05 DIAGNOSIS — I5031 Acute diastolic (congestive) heart failure: Secondary | ICD-10-CM | POA: Diagnosis not present

## 2013-11-05 DIAGNOSIS — R279 Unspecified lack of coordination: Secondary | ICD-10-CM | POA: Diagnosis not present

## 2013-11-05 DIAGNOSIS — Z96649 Presence of unspecified artificial hip joint: Secondary | ICD-10-CM | POA: Diagnosis not present

## 2013-11-05 DIAGNOSIS — G250 Essential tremor: Secondary | ICD-10-CM | POA: Diagnosis present

## 2013-11-05 DIAGNOSIS — R509 Fever, unspecified: Secondary | ICD-10-CM | POA: Diagnosis not present

## 2013-11-05 DIAGNOSIS — R5383 Other fatigue: Secondary | ICD-10-CM | POA: Diagnosis not present

## 2013-11-05 DIAGNOSIS — E049 Nontoxic goiter, unspecified: Secondary | ICD-10-CM | POA: Diagnosis present

## 2013-11-05 DIAGNOSIS — I10 Essential (primary) hypertension: Secondary | ICD-10-CM | POA: Diagnosis not present

## 2013-11-05 DIAGNOSIS — B999 Unspecified infectious disease: Secondary | ICD-10-CM | POA: Diagnosis not present

## 2013-11-05 DIAGNOSIS — F341 Dysthymic disorder: Secondary | ICD-10-CM | POA: Diagnosis present

## 2013-11-05 DIAGNOSIS — I517 Cardiomegaly: Secondary | ICD-10-CM | POA: Diagnosis not present

## 2013-11-05 DIAGNOSIS — M549 Dorsalgia, unspecified: Secondary | ICD-10-CM | POA: Diagnosis not present

## 2013-11-05 DIAGNOSIS — L02419 Cutaneous abscess of limb, unspecified: Secondary | ICD-10-CM | POA: Diagnosis present

## 2013-11-05 DIAGNOSIS — R32 Unspecified urinary incontinence: Secondary | ICD-10-CM | POA: Diagnosis present

## 2013-11-05 DIAGNOSIS — J189 Pneumonia, unspecified organism: Secondary | ICD-10-CM | POA: Diagnosis not present

## 2013-11-05 DIAGNOSIS — I4891 Unspecified atrial fibrillation: Secondary | ICD-10-CM | POA: Diagnosis not present

## 2013-11-05 DIAGNOSIS — L03119 Cellulitis of unspecified part of limb: Secondary | ICD-10-CM | POA: Diagnosis not present

## 2013-11-05 DIAGNOSIS — L298 Other pruritus: Secondary | ICD-10-CM | POA: Diagnosis not present

## 2013-11-05 DIAGNOSIS — G8929 Other chronic pain: Secondary | ICD-10-CM | POA: Diagnosis not present

## 2013-11-05 DIAGNOSIS — D649 Anemia, unspecified: Secondary | ICD-10-CM | POA: Diagnosis not present

## 2013-11-05 DIAGNOSIS — G4733 Obstructive sleep apnea (adult) (pediatric): Secondary | ICD-10-CM | POA: Diagnosis not present

## 2013-11-05 DIAGNOSIS — R918 Other nonspecific abnormal finding of lung field: Secondary | ICD-10-CM | POA: Diagnosis not present

## 2013-11-05 DIAGNOSIS — E876 Hypokalemia: Secondary | ICD-10-CM | POA: Diagnosis not present

## 2013-11-17 DIAGNOSIS — J189 Pneumonia, unspecified organism: Secondary | ICD-10-CM | POA: Diagnosis not present

## 2013-11-17 DIAGNOSIS — E119 Type 2 diabetes mellitus without complications: Secondary | ICD-10-CM | POA: Diagnosis not present

## 2013-11-17 DIAGNOSIS — I10 Essential (primary) hypertension: Secondary | ICD-10-CM | POA: Diagnosis not present

## 2013-11-17 DIAGNOSIS — T8450XA Infection and inflammatory reaction due to unspecified internal joint prosthesis, initial encounter: Secondary | ICD-10-CM | POA: Diagnosis not present

## 2013-11-23 DIAGNOSIS — Z96649 Presence of unspecified artificial hip joint: Secondary | ICD-10-CM | POA: Diagnosis not present

## 2013-11-23 DIAGNOSIS — Z471 Aftercare following joint replacement surgery: Secondary | ICD-10-CM | POA: Diagnosis not present

## 2013-11-24 DIAGNOSIS — I10 Essential (primary) hypertension: Secondary | ICD-10-CM | POA: Diagnosis not present

## 2013-11-24 DIAGNOSIS — T8450XA Infection and inflammatory reaction due to unspecified internal joint prosthesis, initial encounter: Secondary | ICD-10-CM | POA: Diagnosis not present

## 2013-11-24 DIAGNOSIS — E1149 Type 2 diabetes mellitus with other diabetic neurological complication: Secondary | ICD-10-CM | POA: Diagnosis not present

## 2013-11-24 DIAGNOSIS — Z96649 Presence of unspecified artificial hip joint: Secondary | ICD-10-CM | POA: Diagnosis not present

## 2013-11-24 DIAGNOSIS — G589 Mononeuropathy, unspecified: Secondary | ICD-10-CM | POA: Diagnosis not present

## 2013-11-24 DIAGNOSIS — J189 Pneumonia, unspecified organism: Secondary | ICD-10-CM | POA: Diagnosis not present

## 2013-12-06 DIAGNOSIS — T8451XS Infection and inflammatory reaction due to internal right hip prosthesis, sequela: Secondary | ICD-10-CM | POA: Diagnosis not present

## 2013-12-06 DIAGNOSIS — I10 Essential (primary) hypertension: Secondary | ICD-10-CM | POA: Diagnosis not present

## 2013-12-07 DIAGNOSIS — I482 Chronic atrial fibrillation: Secondary | ICD-10-CM | POA: Diagnosis not present

## 2013-12-07 DIAGNOSIS — Z6837 Body mass index (BMI) 37.0-37.9, adult: Secondary | ICD-10-CM | POA: Diagnosis not present

## 2013-12-07 DIAGNOSIS — E1065 Type 1 diabetes mellitus with hyperglycemia: Secondary | ICD-10-CM | POA: Diagnosis not present

## 2013-12-07 DIAGNOSIS — R791 Abnormal coagulation profile: Secondary | ICD-10-CM | POA: Diagnosis not present

## 2013-12-07 DIAGNOSIS — J189 Pneumonia, unspecified organism: Secondary | ICD-10-CM | POA: Diagnosis not present

## 2013-12-09 DIAGNOSIS — I482 Chronic atrial fibrillation: Secondary | ICD-10-CM | POA: Diagnosis not present

## 2013-12-09 DIAGNOSIS — T8451XD Infection and inflammatory reaction due to internal right hip prosthesis, subsequent encounter: Secondary | ICD-10-CM | POA: Diagnosis not present

## 2013-12-09 DIAGNOSIS — Z7982 Long term (current) use of aspirin: Secondary | ICD-10-CM | POA: Diagnosis not present

## 2013-12-09 DIAGNOSIS — I10 Essential (primary) hypertension: Secondary | ICD-10-CM | POA: Diagnosis not present

## 2013-12-09 DIAGNOSIS — I5032 Chronic diastolic (congestive) heart failure: Secondary | ICD-10-CM | POA: Diagnosis not present

## 2013-12-19 DIAGNOSIS — M25551 Pain in right hip: Secondary | ICD-10-CM | POA: Diagnosis not present

## 2013-12-19 DIAGNOSIS — M1611 Unilateral primary osteoarthritis, right hip: Secondary | ICD-10-CM | POA: Diagnosis not present

## 2013-12-19 DIAGNOSIS — R262 Difficulty in walking, not elsewhere classified: Secondary | ICD-10-CM | POA: Diagnosis not present

## 2013-12-21 DIAGNOSIS — Z6839 Body mass index (BMI) 39.0-39.9, adult: Secondary | ICD-10-CM | POA: Diagnosis not present

## 2013-12-21 DIAGNOSIS — R262 Difficulty in walking, not elsewhere classified: Secondary | ICD-10-CM | POA: Diagnosis not present

## 2013-12-21 DIAGNOSIS — Z96641 Presence of right artificial hip joint: Secondary | ICD-10-CM | POA: Diagnosis not present

## 2013-12-21 DIAGNOSIS — R791 Abnormal coagulation profile: Secondary | ICD-10-CM | POA: Diagnosis not present

## 2013-12-21 DIAGNOSIS — Z23 Encounter for immunization: Secondary | ICD-10-CM | POA: Diagnosis not present

## 2013-12-21 DIAGNOSIS — E1065 Type 1 diabetes mellitus with hyperglycemia: Secondary | ICD-10-CM | POA: Diagnosis not present

## 2013-12-21 DIAGNOSIS — M25551 Pain in right hip: Secondary | ICD-10-CM | POA: Diagnosis not present

## 2013-12-21 DIAGNOSIS — M1611 Unilateral primary osteoarthritis, right hip: Secondary | ICD-10-CM | POA: Diagnosis not present

## 2013-12-21 DIAGNOSIS — Z471 Aftercare following joint replacement surgery: Secondary | ICD-10-CM | POA: Diagnosis not present

## 2013-12-21 DIAGNOSIS — Z7901 Long term (current) use of anticoagulants: Secondary | ICD-10-CM | POA: Diagnosis not present

## 2013-12-21 DIAGNOSIS — M5135 Other intervertebral disc degeneration, thoracolumbar region: Secondary | ICD-10-CM | POA: Diagnosis not present

## 2013-12-23 DIAGNOSIS — M1611 Unilateral primary osteoarthritis, right hip: Secondary | ICD-10-CM | POA: Diagnosis not present

## 2013-12-23 DIAGNOSIS — M25551 Pain in right hip: Secondary | ICD-10-CM | POA: Diagnosis not present

## 2013-12-23 DIAGNOSIS — R262 Difficulty in walking, not elsewhere classified: Secondary | ICD-10-CM | POA: Diagnosis not present

## 2013-12-26 DIAGNOSIS — M1611 Unilateral primary osteoarthritis, right hip: Secondary | ICD-10-CM | POA: Diagnosis not present

## 2013-12-26 DIAGNOSIS — M25551 Pain in right hip: Secondary | ICD-10-CM | POA: Diagnosis not present

## 2013-12-26 DIAGNOSIS — R262 Difficulty in walking, not elsewhere classified: Secondary | ICD-10-CM | POA: Diagnosis not present

## 2013-12-27 DIAGNOSIS — E782 Mixed hyperlipidemia: Secondary | ICD-10-CM | POA: Diagnosis not present

## 2013-12-27 DIAGNOSIS — F411 Generalized anxiety disorder: Secondary | ICD-10-CM | POA: Diagnosis not present

## 2013-12-27 DIAGNOSIS — R0602 Shortness of breath: Secondary | ICD-10-CM | POA: Diagnosis not present

## 2013-12-27 DIAGNOSIS — R609 Edema, unspecified: Secondary | ICD-10-CM | POA: Diagnosis not present

## 2013-12-27 DIAGNOSIS — F330 Major depressive disorder, recurrent, mild: Secondary | ICD-10-CM | POA: Diagnosis not present

## 2013-12-27 DIAGNOSIS — I4891 Unspecified atrial fibrillation: Secondary | ICD-10-CM | POA: Diagnosis not present

## 2013-12-28 DIAGNOSIS — I10 Essential (primary) hypertension: Secondary | ICD-10-CM | POA: Diagnosis not present

## 2013-12-28 DIAGNOSIS — Z471 Aftercare following joint replacement surgery: Secondary | ICD-10-CM | POA: Diagnosis not present

## 2013-12-28 DIAGNOSIS — Z96641 Presence of right artificial hip joint: Secondary | ICD-10-CM | POA: Diagnosis not present

## 2013-12-28 DIAGNOSIS — M1611 Unilateral primary osteoarthritis, right hip: Secondary | ICD-10-CM | POA: Diagnosis not present

## 2013-12-29 DIAGNOSIS — R262 Difficulty in walking, not elsewhere classified: Secondary | ICD-10-CM | POA: Diagnosis not present

## 2013-12-29 DIAGNOSIS — M1611 Unilateral primary osteoarthritis, right hip: Secondary | ICD-10-CM | POA: Diagnosis not present

## 2013-12-29 DIAGNOSIS — M25551 Pain in right hip: Secondary | ICD-10-CM | POA: Diagnosis not present

## 2013-12-30 DIAGNOSIS — R262 Difficulty in walking, not elsewhere classified: Secondary | ICD-10-CM | POA: Diagnosis not present

## 2013-12-30 DIAGNOSIS — M1611 Unilateral primary osteoarthritis, right hip: Secondary | ICD-10-CM | POA: Diagnosis not present

## 2013-12-30 DIAGNOSIS — M25551 Pain in right hip: Secondary | ICD-10-CM | POA: Diagnosis not present

## 2014-01-02 DIAGNOSIS — M25551 Pain in right hip: Secondary | ICD-10-CM | POA: Diagnosis not present

## 2014-01-02 DIAGNOSIS — R262 Difficulty in walking, not elsewhere classified: Secondary | ICD-10-CM | POA: Diagnosis not present

## 2014-01-02 DIAGNOSIS — M1611 Unilateral primary osteoarthritis, right hip: Secondary | ICD-10-CM | POA: Diagnosis not present

## 2014-01-04 DIAGNOSIS — R0602 Shortness of breath: Secondary | ICD-10-CM | POA: Diagnosis not present

## 2014-01-04 DIAGNOSIS — I4891 Unspecified atrial fibrillation: Secondary | ICD-10-CM | POA: Diagnosis not present

## 2014-01-04 DIAGNOSIS — M79606 Pain in leg, unspecified: Secondary | ICD-10-CM | POA: Diagnosis not present

## 2014-01-04 DIAGNOSIS — R609 Edema, unspecified: Secondary | ICD-10-CM | POA: Diagnosis not present

## 2014-01-06 DIAGNOSIS — I4891 Unspecified atrial fibrillation: Secondary | ICD-10-CM | POA: Diagnosis not present

## 2014-01-06 DIAGNOSIS — R609 Edema, unspecified: Secondary | ICD-10-CM | POA: Diagnosis not present

## 2014-01-06 DIAGNOSIS — Z7901 Long term (current) use of anticoagulants: Secondary | ICD-10-CM | POA: Diagnosis not present

## 2014-01-06 DIAGNOSIS — E875 Hyperkalemia: Secondary | ICD-10-CM | POA: Diagnosis not present

## 2014-01-09 DIAGNOSIS — I5043 Acute on chronic combined systolic (congestive) and diastolic (congestive) heart failure: Secondary | ICD-10-CM | POA: Diagnosis not present

## 2014-01-09 DIAGNOSIS — G2581 Restless legs syndrome: Secondary | ICD-10-CM | POA: Diagnosis not present

## 2014-01-09 DIAGNOSIS — Z79899 Other long term (current) drug therapy: Secondary | ICD-10-CM | POA: Diagnosis not present

## 2014-01-09 DIAGNOSIS — Z7901 Long term (current) use of anticoagulants: Secondary | ICD-10-CM | POA: Diagnosis not present

## 2014-01-09 DIAGNOSIS — E1065 Type 1 diabetes mellitus with hyperglycemia: Secondary | ICD-10-CM | POA: Diagnosis not present

## 2014-01-09 DIAGNOSIS — M5135 Other intervertebral disc degeneration, thoracolumbar region: Secondary | ICD-10-CM | POA: Diagnosis not present

## 2014-01-09 DIAGNOSIS — I10 Essential (primary) hypertension: Secondary | ICD-10-CM | POA: Diagnosis not present

## 2014-01-09 DIAGNOSIS — R791 Abnormal coagulation profile: Secondary | ICD-10-CM | POA: Diagnosis not present

## 2014-01-10 DIAGNOSIS — M25551 Pain in right hip: Secondary | ICD-10-CM | POA: Diagnosis not present

## 2014-01-10 DIAGNOSIS — M1611 Unilateral primary osteoarthritis, right hip: Secondary | ICD-10-CM | POA: Diagnosis not present

## 2014-01-10 DIAGNOSIS — R262 Difficulty in walking, not elsewhere classified: Secondary | ICD-10-CM | POA: Diagnosis not present

## 2014-01-12 DIAGNOSIS — M25551 Pain in right hip: Secondary | ICD-10-CM | POA: Diagnosis not present

## 2014-01-12 DIAGNOSIS — M1611 Unilateral primary osteoarthritis, right hip: Secondary | ICD-10-CM | POA: Diagnosis not present

## 2014-01-12 DIAGNOSIS — R262 Difficulty in walking, not elsewhere classified: Secondary | ICD-10-CM | POA: Diagnosis not present

## 2014-01-16 DIAGNOSIS — S0530XA Ocular laceration without prolapse or loss of intraocular tissue, unspecified eye, initial encounter: Secondary | ICD-10-CM | POA: Diagnosis not present

## 2014-01-16 DIAGNOSIS — W0110XA Fall on same level from slipping, tripping and stumbling with subsequent striking against unspecified object, initial encounter: Secondary | ICD-10-CM | POA: Diagnosis not present

## 2014-01-16 DIAGNOSIS — R404 Transient alteration of awareness: Secondary | ICD-10-CM | POA: Diagnosis not present

## 2014-01-16 DIAGNOSIS — S59901A Unspecified injury of right elbow, initial encounter: Secondary | ICD-10-CM | POA: Diagnosis not present

## 2014-01-16 DIAGNOSIS — W01190A Fall on same level from slipping, tripping and stumbling with subsequent striking against furniture, initial encounter: Secondary | ICD-10-CM | POA: Diagnosis not present

## 2014-01-16 DIAGNOSIS — S51011A Laceration without foreign body of right elbow, initial encounter: Secondary | ICD-10-CM | POA: Diagnosis not present

## 2014-01-16 DIAGNOSIS — R42 Dizziness and giddiness: Secondary | ICD-10-CM | POA: Diagnosis not present

## 2014-01-16 DIAGNOSIS — S0541XA Penetrating wound of orbit with or without foreign body, right eye, initial encounter: Secondary | ICD-10-CM | POA: Diagnosis not present

## 2014-01-16 DIAGNOSIS — R51 Headache: Secondary | ICD-10-CM | POA: Diagnosis not present

## 2014-01-16 DIAGNOSIS — Z794 Long term (current) use of insulin: Secondary | ICD-10-CM | POA: Diagnosis not present

## 2014-01-16 DIAGNOSIS — Z7982 Long term (current) use of aspirin: Secondary | ICD-10-CM | POA: Diagnosis not present

## 2014-01-16 DIAGNOSIS — Z7901 Long term (current) use of anticoagulants: Secondary | ICD-10-CM | POA: Diagnosis not present

## 2014-01-16 DIAGNOSIS — M25521 Pain in right elbow: Secondary | ICD-10-CM | POA: Diagnosis not present

## 2014-01-16 DIAGNOSIS — S0540XA Penetrating wound of orbit with or without foreign body, unspecified eye, initial encounter: Secondary | ICD-10-CM | POA: Diagnosis not present

## 2014-01-16 DIAGNOSIS — Y92538 Other ambulatory health services establishments as the place of occurrence of the external cause: Secondary | ICD-10-CM | POA: Diagnosis not present

## 2014-01-16 DIAGNOSIS — S0181XA Laceration without foreign body of other part of head, initial encounter: Secondary | ICD-10-CM | POA: Diagnosis not present

## 2014-01-18 DIAGNOSIS — R0602 Shortness of breath: Secondary | ICD-10-CM | POA: Diagnosis not present

## 2014-01-18 DIAGNOSIS — I4891 Unspecified atrial fibrillation: Secondary | ICD-10-CM | POA: Diagnosis not present

## 2014-01-23 DIAGNOSIS — M1611 Unilateral primary osteoarthritis, right hip: Secondary | ICD-10-CM | POA: Diagnosis not present

## 2014-01-23 DIAGNOSIS — M25551 Pain in right hip: Secondary | ICD-10-CM | POA: Diagnosis not present

## 2014-01-23 DIAGNOSIS — R262 Difficulty in walking, not elsewhere classified: Secondary | ICD-10-CM | POA: Diagnosis not present

## 2014-01-24 DIAGNOSIS — M1611 Unilateral primary osteoarthritis, right hip: Secondary | ICD-10-CM | POA: Diagnosis not present

## 2014-01-24 DIAGNOSIS — M25551 Pain in right hip: Secondary | ICD-10-CM | POA: Diagnosis not present

## 2014-01-24 DIAGNOSIS — R262 Difficulty in walking, not elsewhere classified: Secondary | ICD-10-CM | POA: Diagnosis not present

## 2014-01-30 DIAGNOSIS — R262 Difficulty in walking, not elsewhere classified: Secondary | ICD-10-CM | POA: Diagnosis not present

## 2014-01-30 DIAGNOSIS — M1611 Unilateral primary osteoarthritis, right hip: Secondary | ICD-10-CM | POA: Diagnosis not present

## 2014-01-30 DIAGNOSIS — M25551 Pain in right hip: Secondary | ICD-10-CM | POA: Diagnosis not present

## 2014-02-01 DIAGNOSIS — G4730 Sleep apnea, unspecified: Secondary | ICD-10-CM | POA: Diagnosis not present

## 2014-02-01 DIAGNOSIS — Z91030 Bee allergy status: Secondary | ICD-10-CM | POA: Diagnosis not present

## 2014-02-01 DIAGNOSIS — Z96641 Presence of right artificial hip joint: Secondary | ICD-10-CM | POA: Diagnosis not present

## 2014-02-01 DIAGNOSIS — Z79899 Other long term (current) drug therapy: Secondary | ICD-10-CM | POA: Diagnosis not present

## 2014-02-01 DIAGNOSIS — M797 Fibromyalgia: Secondary | ICD-10-CM | POA: Diagnosis not present

## 2014-02-01 DIAGNOSIS — I10 Essential (primary) hypertension: Secondary | ICD-10-CM | POA: Diagnosis not present

## 2014-02-01 DIAGNOSIS — I509 Heart failure, unspecified: Secondary | ICD-10-CM | POA: Diagnosis not present

## 2014-02-01 DIAGNOSIS — Z471 Aftercare following joint replacement surgery: Secondary | ICD-10-CM | POA: Diagnosis not present

## 2014-02-01 DIAGNOSIS — Z09 Encounter for follow-up examination after completed treatment for conditions other than malignant neoplasm: Secondary | ICD-10-CM | POA: Diagnosis not present

## 2014-02-01 DIAGNOSIS — Z888 Allergy status to other drugs, medicaments and biological substances status: Secondary | ICD-10-CM | POA: Diagnosis not present

## 2014-02-01 DIAGNOSIS — E119 Type 2 diabetes mellitus without complications: Secondary | ICD-10-CM | POA: Diagnosis not present

## 2014-02-01 DIAGNOSIS — M1611 Unilateral primary osteoarthritis, right hip: Secondary | ICD-10-CM | POA: Diagnosis not present

## 2014-02-01 DIAGNOSIS — I4891 Unspecified atrial fibrillation: Secondary | ICD-10-CM | POA: Diagnosis not present

## 2014-02-02 DIAGNOSIS — M25551 Pain in right hip: Secondary | ICD-10-CM | POA: Diagnosis not present

## 2014-02-02 DIAGNOSIS — R262 Difficulty in walking, not elsewhere classified: Secondary | ICD-10-CM | POA: Diagnosis not present

## 2014-02-02 DIAGNOSIS — M1611 Unilateral primary osteoarthritis, right hip: Secondary | ICD-10-CM | POA: Diagnosis not present

## 2014-02-03 DIAGNOSIS — R262 Difficulty in walking, not elsewhere classified: Secondary | ICD-10-CM | POA: Diagnosis not present

## 2014-02-03 DIAGNOSIS — M1611 Unilateral primary osteoarthritis, right hip: Secondary | ICD-10-CM | POA: Diagnosis not present

## 2014-02-03 DIAGNOSIS — M25551 Pain in right hip: Secondary | ICD-10-CM | POA: Diagnosis not present

## 2014-02-08 DIAGNOSIS — M25551 Pain in right hip: Secondary | ICD-10-CM | POA: Diagnosis not present

## 2014-02-08 DIAGNOSIS — M1611 Unilateral primary osteoarthritis, right hip: Secondary | ICD-10-CM | POA: Diagnosis not present

## 2014-02-08 DIAGNOSIS — R262 Difficulty in walking, not elsewhere classified: Secondary | ICD-10-CM | POA: Diagnosis not present

## 2014-02-14 DIAGNOSIS — E041 Nontoxic single thyroid nodule: Secondary | ICD-10-CM | POA: Diagnosis not present

## 2014-02-14 DIAGNOSIS — Z6836 Body mass index (BMI) 36.0-36.9, adult: Secondary | ICD-10-CM | POA: Diagnosis not present

## 2014-02-14 DIAGNOSIS — Z79899 Other long term (current) drug therapy: Secondary | ICD-10-CM | POA: Diagnosis not present

## 2014-02-14 DIAGNOSIS — R791 Abnormal coagulation profile: Secondary | ICD-10-CM | POA: Diagnosis not present

## 2014-02-14 DIAGNOSIS — E782 Mixed hyperlipidemia: Secondary | ICD-10-CM | POA: Diagnosis not present

## 2014-02-14 DIAGNOSIS — E1065 Type 1 diabetes mellitus with hyperglycemia: Secondary | ICD-10-CM | POA: Diagnosis not present

## 2014-02-15 DIAGNOSIS — M1611 Unilateral primary osteoarthritis, right hip: Secondary | ICD-10-CM | POA: Diagnosis not present

## 2014-02-15 DIAGNOSIS — R262 Difficulty in walking, not elsewhere classified: Secondary | ICD-10-CM | POA: Diagnosis not present

## 2014-02-15 DIAGNOSIS — M25551 Pain in right hip: Secondary | ICD-10-CM | POA: Diagnosis not present

## 2014-02-16 DIAGNOSIS — R262 Difficulty in walking, not elsewhere classified: Secondary | ICD-10-CM | POA: Diagnosis not present

## 2014-02-16 DIAGNOSIS — I4891 Unspecified atrial fibrillation: Secondary | ICD-10-CM | POA: Diagnosis not present

## 2014-02-16 DIAGNOSIS — M25551 Pain in right hip: Secondary | ICD-10-CM | POA: Diagnosis not present

## 2014-02-16 DIAGNOSIS — M1611 Unilateral primary osteoarthritis, right hip: Secondary | ICD-10-CM | POA: Diagnosis not present

## 2014-02-17 DIAGNOSIS — R262 Difficulty in walking, not elsewhere classified: Secondary | ICD-10-CM | POA: Diagnosis not present

## 2014-02-17 DIAGNOSIS — M1611 Unilateral primary osteoarthritis, right hip: Secondary | ICD-10-CM | POA: Diagnosis not present

## 2014-02-17 DIAGNOSIS — M25551 Pain in right hip: Secondary | ICD-10-CM | POA: Diagnosis not present

## 2014-02-21 DIAGNOSIS — R262 Difficulty in walking, not elsewhere classified: Secondary | ICD-10-CM | POA: Diagnosis not present

## 2014-02-21 DIAGNOSIS — M1611 Unilateral primary osteoarthritis, right hip: Secondary | ICD-10-CM | POA: Diagnosis not present

## 2014-02-21 DIAGNOSIS — M25551 Pain in right hip: Secondary | ICD-10-CM | POA: Diagnosis not present

## 2014-02-28 DIAGNOSIS — M1611 Unilateral primary osteoarthritis, right hip: Secondary | ICD-10-CM | POA: Diagnosis not present

## 2014-02-28 DIAGNOSIS — R262 Difficulty in walking, not elsewhere classified: Secondary | ICD-10-CM | POA: Diagnosis not present

## 2014-02-28 DIAGNOSIS — M25551 Pain in right hip: Secondary | ICD-10-CM | POA: Diagnosis not present

## 2014-03-02 DIAGNOSIS — R262 Difficulty in walking, not elsewhere classified: Secondary | ICD-10-CM | POA: Diagnosis not present

## 2014-03-02 DIAGNOSIS — M25551 Pain in right hip: Secondary | ICD-10-CM | POA: Diagnosis not present

## 2014-03-02 DIAGNOSIS — M1611 Unilateral primary osteoarthritis, right hip: Secondary | ICD-10-CM | POA: Diagnosis not present

## 2014-03-09 DIAGNOSIS — R262 Difficulty in walking, not elsewhere classified: Secondary | ICD-10-CM | POA: Diagnosis not present

## 2014-03-09 DIAGNOSIS — M25551 Pain in right hip: Secondary | ICD-10-CM | POA: Diagnosis not present

## 2014-03-09 DIAGNOSIS — M1611 Unilateral primary osteoarthritis, right hip: Secondary | ICD-10-CM | POA: Diagnosis not present

## 2014-03-13 DIAGNOSIS — M25551 Pain in right hip: Secondary | ICD-10-CM | POA: Diagnosis not present

## 2014-03-13 DIAGNOSIS — M1611 Unilateral primary osteoarthritis, right hip: Secondary | ICD-10-CM | POA: Diagnosis not present

## 2014-03-13 DIAGNOSIS — R262 Difficulty in walking, not elsewhere classified: Secondary | ICD-10-CM | POA: Diagnosis not present

## 2014-03-15 DIAGNOSIS — E041 Nontoxic single thyroid nodule: Secondary | ICD-10-CM | POA: Diagnosis not present

## 2014-03-15 DIAGNOSIS — M545 Low back pain: Secondary | ICD-10-CM | POA: Diagnosis not present

## 2014-03-15 DIAGNOSIS — E782 Mixed hyperlipidemia: Secondary | ICD-10-CM | POA: Diagnosis not present

## 2014-03-15 DIAGNOSIS — E1065 Type 1 diabetes mellitus with hyperglycemia: Secondary | ICD-10-CM | POA: Diagnosis not present

## 2014-03-15 DIAGNOSIS — J069 Acute upper respiratory infection, unspecified: Secondary | ICD-10-CM | POA: Diagnosis not present

## 2014-03-15 DIAGNOSIS — Z6839 Body mass index (BMI) 39.0-39.9, adult: Secondary | ICD-10-CM | POA: Diagnosis not present

## 2014-03-15 DIAGNOSIS — R791 Abnormal coagulation profile: Secondary | ICD-10-CM | POA: Diagnosis not present

## 2014-03-22 DIAGNOSIS — R262 Difficulty in walking, not elsewhere classified: Secondary | ICD-10-CM | POA: Diagnosis not present

## 2014-03-22 DIAGNOSIS — M25551 Pain in right hip: Secondary | ICD-10-CM | POA: Diagnosis not present

## 2014-03-22 DIAGNOSIS — M1611 Unilateral primary osteoarthritis, right hip: Secondary | ICD-10-CM | POA: Diagnosis not present

## 2014-03-30 DIAGNOSIS — M25551 Pain in right hip: Secondary | ICD-10-CM | POA: Diagnosis not present

## 2014-03-30 DIAGNOSIS — M1611 Unilateral primary osteoarthritis, right hip: Secondary | ICD-10-CM | POA: Diagnosis not present

## 2014-03-30 DIAGNOSIS — R262 Difficulty in walking, not elsewhere classified: Secondary | ICD-10-CM | POA: Diagnosis not present

## 2014-04-05 DIAGNOSIS — R262 Difficulty in walking, not elsewhere classified: Secondary | ICD-10-CM | POA: Diagnosis not present

## 2014-04-05 DIAGNOSIS — M25551 Pain in right hip: Secondary | ICD-10-CM | POA: Diagnosis not present

## 2014-04-05 DIAGNOSIS — M1611 Unilateral primary osteoarthritis, right hip: Secondary | ICD-10-CM | POA: Diagnosis not present

## 2014-04-07 DIAGNOSIS — M25551 Pain in right hip: Secondary | ICD-10-CM | POA: Diagnosis not present

## 2014-04-07 DIAGNOSIS — M1611 Unilateral primary osteoarthritis, right hip: Secondary | ICD-10-CM | POA: Diagnosis not present

## 2014-04-07 DIAGNOSIS — R262 Difficulty in walking, not elsewhere classified: Secondary | ICD-10-CM | POA: Diagnosis not present

## 2014-04-10 DIAGNOSIS — R609 Edema, unspecified: Secondary | ICD-10-CM | POA: Diagnosis not present

## 2014-04-10 DIAGNOSIS — F330 Major depressive disorder, recurrent, mild: Secondary | ICD-10-CM | POA: Diagnosis not present

## 2014-04-10 DIAGNOSIS — I4891 Unspecified atrial fibrillation: Secondary | ICD-10-CM | POA: Diagnosis not present

## 2014-04-12 DIAGNOSIS — R791 Abnormal coagulation profile: Secondary | ICD-10-CM | POA: Diagnosis not present

## 2014-04-12 DIAGNOSIS — G2581 Restless legs syndrome: Secondary | ICD-10-CM | POA: Diagnosis not present

## 2014-04-12 DIAGNOSIS — R110 Nausea: Secondary | ICD-10-CM | POA: Diagnosis not present

## 2014-04-12 DIAGNOSIS — M170 Bilateral primary osteoarthritis of knee: Secondary | ICD-10-CM | POA: Diagnosis not present

## 2014-04-12 DIAGNOSIS — Z6838 Body mass index (BMI) 38.0-38.9, adult: Secondary | ICD-10-CM | POA: Diagnosis not present

## 2014-04-12 DIAGNOSIS — E1065 Type 1 diabetes mellitus with hyperglycemia: Secondary | ICD-10-CM | POA: Diagnosis not present

## 2014-04-12 DIAGNOSIS — I482 Chronic atrial fibrillation: Secondary | ICD-10-CM | POA: Diagnosis not present

## 2014-04-13 DIAGNOSIS — I4891 Unspecified atrial fibrillation: Secondary | ICD-10-CM | POA: Diagnosis not present

## 2014-04-19 DIAGNOSIS — R0602 Shortness of breath: Secondary | ICD-10-CM | POA: Diagnosis not present

## 2014-04-19 DIAGNOSIS — M79606 Pain in leg, unspecified: Secondary | ICD-10-CM | POA: Diagnosis not present

## 2014-04-19 DIAGNOSIS — R609 Edema, unspecified: Secondary | ICD-10-CM | POA: Diagnosis not present

## 2014-04-19 DIAGNOSIS — I4891 Unspecified atrial fibrillation: Secondary | ICD-10-CM | POA: Diagnosis not present

## 2014-05-11 DIAGNOSIS — E782 Mixed hyperlipidemia: Secondary | ICD-10-CM | POA: Diagnosis not present

## 2014-05-11 DIAGNOSIS — R791 Abnormal coagulation profile: Secondary | ICD-10-CM | POA: Diagnosis not present

## 2014-05-11 DIAGNOSIS — E1065 Type 1 diabetes mellitus with hyperglycemia: Secondary | ICD-10-CM | POA: Diagnosis not present

## 2014-05-11 DIAGNOSIS — Z1231 Encounter for screening mammogram for malignant neoplasm of breast: Secondary | ICD-10-CM | POA: Diagnosis not present

## 2014-05-11 DIAGNOSIS — E041 Nontoxic single thyroid nodule: Secondary | ICD-10-CM | POA: Diagnosis not present

## 2014-05-11 DIAGNOSIS — N390 Urinary tract infection, site not specified: Secondary | ICD-10-CM | POA: Diagnosis not present

## 2014-05-11 DIAGNOSIS — E559 Vitamin D deficiency, unspecified: Secondary | ICD-10-CM | POA: Diagnosis not present

## 2014-05-11 DIAGNOSIS — Z79899 Other long term (current) drug therapy: Secondary | ICD-10-CM | POA: Diagnosis not present

## 2014-05-11 DIAGNOSIS — M5135 Other intervertebral disc degeneration, thoracolumbar region: Secondary | ICD-10-CM | POA: Diagnosis not present

## 2014-05-25 DIAGNOSIS — M1711 Unilateral primary osteoarthritis, right knee: Secondary | ICD-10-CM | POA: Diagnosis not present

## 2014-05-25 DIAGNOSIS — M25561 Pain in right knee: Secondary | ICD-10-CM | POA: Diagnosis not present

## 2014-05-25 DIAGNOSIS — Z01818 Encounter for other preprocedural examination: Secondary | ICD-10-CM | POA: Diagnosis not present

## 2014-05-25 DIAGNOSIS — M85861 Other specified disorders of bone density and structure, right lower leg: Secondary | ICD-10-CM | POA: Diagnosis not present

## 2014-05-25 DIAGNOSIS — M25461 Effusion, right knee: Secondary | ICD-10-CM | POA: Diagnosis not present

## 2014-05-31 DIAGNOSIS — I4891 Unspecified atrial fibrillation: Secondary | ICD-10-CM | POA: Diagnosis not present

## 2014-05-31 DIAGNOSIS — R609 Edema, unspecified: Secondary | ICD-10-CM | POA: Diagnosis not present

## 2014-06-13 DIAGNOSIS — E559 Vitamin D deficiency, unspecified: Secondary | ICD-10-CM | POA: Diagnosis not present

## 2014-06-13 DIAGNOSIS — I482 Chronic atrial fibrillation: Secondary | ICD-10-CM | POA: Diagnosis not present

## 2014-06-13 DIAGNOSIS — Z6841 Body Mass Index (BMI) 40.0 and over, adult: Secondary | ICD-10-CM | POA: Diagnosis not present

## 2014-06-13 DIAGNOSIS — M545 Low back pain: Secondary | ICD-10-CM | POA: Diagnosis not present

## 2014-06-13 DIAGNOSIS — E782 Mixed hyperlipidemia: Secondary | ICD-10-CM | POA: Diagnosis not present

## 2014-06-13 DIAGNOSIS — E1065 Type 1 diabetes mellitus with hyperglycemia: Secondary | ICD-10-CM | POA: Diagnosis not present

## 2014-06-13 DIAGNOSIS — Z1231 Encounter for screening mammogram for malignant neoplasm of breast: Secondary | ICD-10-CM | POA: Diagnosis not present

## 2014-06-13 DIAGNOSIS — R791 Abnormal coagulation profile: Secondary | ICD-10-CM | POA: Diagnosis not present

## 2014-06-14 DIAGNOSIS — Z1231 Encounter for screening mammogram for malignant neoplasm of breast: Secondary | ICD-10-CM | POA: Diagnosis not present

## 2014-06-20 DIAGNOSIS — M545 Low back pain: Secondary | ICD-10-CM | POA: Diagnosis not present

## 2014-06-20 DIAGNOSIS — M797 Fibromyalgia: Secondary | ICD-10-CM | POA: Diagnosis not present

## 2014-07-05 DIAGNOSIS — M545 Low back pain: Secondary | ICD-10-CM | POA: Diagnosis not present

## 2014-07-05 DIAGNOSIS — M797 Fibromyalgia: Secondary | ICD-10-CM | POA: Diagnosis not present

## 2014-07-06 DIAGNOSIS — F411 Generalized anxiety disorder: Secondary | ICD-10-CM | POA: Diagnosis not present

## 2014-07-06 DIAGNOSIS — F330 Major depressive disorder, recurrent, mild: Secondary | ICD-10-CM | POA: Diagnosis not present

## 2014-07-11 DIAGNOSIS — M545 Low back pain: Secondary | ICD-10-CM | POA: Diagnosis not present

## 2014-07-11 DIAGNOSIS — R791 Abnormal coagulation profile: Secondary | ICD-10-CM | POA: Diagnosis not present

## 2014-07-11 DIAGNOSIS — I10 Essential (primary) hypertension: Secondary | ICD-10-CM | POA: Diagnosis not present

## 2014-07-11 DIAGNOSIS — Z6841 Body Mass Index (BMI) 40.0 and over, adult: Secondary | ICD-10-CM | POA: Diagnosis not present

## 2014-07-11 DIAGNOSIS — E1065 Type 1 diabetes mellitus with hyperglycemia: Secondary | ICD-10-CM | POA: Diagnosis not present

## 2014-07-11 DIAGNOSIS — I482 Chronic atrial fibrillation: Secondary | ICD-10-CM | POA: Diagnosis not present

## 2014-07-19 DIAGNOSIS — M797 Fibromyalgia: Secondary | ICD-10-CM | POA: Diagnosis not present

## 2014-07-19 DIAGNOSIS — M545 Low back pain: Secondary | ICD-10-CM | POA: Diagnosis not present

## 2014-07-25 DIAGNOSIS — I509 Heart failure, unspecified: Secondary | ICD-10-CM | POA: Diagnosis not present

## 2014-07-25 DIAGNOSIS — I10 Essential (primary) hypertension: Secondary | ICD-10-CM | POA: Diagnosis not present

## 2014-07-25 DIAGNOSIS — I482 Chronic atrial fibrillation: Secondary | ICD-10-CM | POA: Diagnosis not present

## 2014-07-27 DIAGNOSIS — M545 Low back pain: Secondary | ICD-10-CM | POA: Diagnosis not present

## 2014-07-27 DIAGNOSIS — M797 Fibromyalgia: Secondary | ICD-10-CM | POA: Diagnosis not present

## 2014-08-01 DIAGNOSIS — I509 Heart failure, unspecified: Secondary | ICD-10-CM | POA: Diagnosis not present

## 2014-08-01 DIAGNOSIS — I10 Essential (primary) hypertension: Secondary | ICD-10-CM | POA: Diagnosis not present

## 2014-08-01 DIAGNOSIS — I517 Cardiomegaly: Secondary | ICD-10-CM | POA: Diagnosis not present

## 2014-08-01 DIAGNOSIS — I350 Nonrheumatic aortic (valve) stenosis: Secondary | ICD-10-CM | POA: Diagnosis not present

## 2014-08-02 DIAGNOSIS — M545 Low back pain: Secondary | ICD-10-CM | POA: Diagnosis not present

## 2014-08-02 DIAGNOSIS — M797 Fibromyalgia: Secondary | ICD-10-CM | POA: Diagnosis not present

## 2014-08-09 DIAGNOSIS — M797 Fibromyalgia: Secondary | ICD-10-CM | POA: Diagnosis not present

## 2014-08-09 DIAGNOSIS — M545 Low back pain: Secondary | ICD-10-CM | POA: Diagnosis not present

## 2014-08-10 DIAGNOSIS — I482 Chronic atrial fibrillation: Secondary | ICD-10-CM | POA: Diagnosis not present

## 2014-08-10 DIAGNOSIS — R600 Localized edema: Secondary | ICD-10-CM | POA: Diagnosis not present

## 2014-08-10 DIAGNOSIS — I5020 Unspecified systolic (congestive) heart failure: Secondary | ICD-10-CM | POA: Diagnosis not present

## 2014-08-22 DIAGNOSIS — Z79899 Other long term (current) drug therapy: Secondary | ICD-10-CM | POA: Diagnosis not present

## 2014-08-22 DIAGNOSIS — M545 Low back pain: Secondary | ICD-10-CM | POA: Diagnosis not present

## 2014-08-22 DIAGNOSIS — I10 Essential (primary) hypertension: Secondary | ICD-10-CM | POA: Diagnosis not present

## 2014-08-22 DIAGNOSIS — Z6841 Body Mass Index (BMI) 40.0 and over, adult: Secondary | ICD-10-CM | POA: Diagnosis not present

## 2014-08-22 DIAGNOSIS — R791 Abnormal coagulation profile: Secondary | ICD-10-CM | POA: Diagnosis not present

## 2014-08-22 DIAGNOSIS — E782 Mixed hyperlipidemia: Secondary | ICD-10-CM | POA: Diagnosis not present

## 2014-08-22 DIAGNOSIS — E1065 Type 1 diabetes mellitus with hyperglycemia: Secondary | ICD-10-CM | POA: Diagnosis not present

## 2014-08-22 DIAGNOSIS — E559 Vitamin D deficiency, unspecified: Secondary | ICD-10-CM | POA: Diagnosis not present

## 2014-08-22 DIAGNOSIS — R300 Dysuria: Secondary | ICD-10-CM | POA: Diagnosis not present

## 2014-08-30 DIAGNOSIS — M797 Fibromyalgia: Secondary | ICD-10-CM | POA: Diagnosis not present

## 2014-08-30 DIAGNOSIS — M545 Low back pain: Secondary | ICD-10-CM | POA: Diagnosis not present

## 2014-09-19 DIAGNOSIS — M797 Fibromyalgia: Secondary | ICD-10-CM | POA: Diagnosis not present

## 2014-09-19 DIAGNOSIS — M545 Low back pain: Secondary | ICD-10-CM | POA: Diagnosis not present

## 2014-09-20 DIAGNOSIS — M545 Low back pain: Secondary | ICD-10-CM | POA: Diagnosis not present

## 2014-09-20 DIAGNOSIS — L98499 Non-pressure chronic ulcer of skin of other sites with unspecified severity: Secondary | ICD-10-CM | POA: Diagnosis not present

## 2014-09-20 DIAGNOSIS — R791 Abnormal coagulation profile: Secondary | ICD-10-CM | POA: Diagnosis not present

## 2014-09-20 DIAGNOSIS — E1065 Type 1 diabetes mellitus with hyperglycemia: Secondary | ICD-10-CM | POA: Diagnosis not present

## 2014-09-20 DIAGNOSIS — Z6841 Body Mass Index (BMI) 40.0 and over, adult: Secondary | ICD-10-CM | POA: Diagnosis not present

## 2014-09-20 DIAGNOSIS — G2581 Restless legs syndrome: Secondary | ICD-10-CM | POA: Diagnosis not present

## 2014-09-20 DIAGNOSIS — R300 Dysuria: Secondary | ICD-10-CM | POA: Diagnosis not present

## 2014-09-27 DIAGNOSIS — M545 Low back pain: Secondary | ICD-10-CM | POA: Diagnosis not present

## 2014-09-27 DIAGNOSIS — M797 Fibromyalgia: Secondary | ICD-10-CM | POA: Diagnosis not present

## 2014-10-04 DIAGNOSIS — F330 Major depressive disorder, recurrent, mild: Secondary | ICD-10-CM | POA: Diagnosis not present

## 2014-10-12 DIAGNOSIS — M797 Fibromyalgia: Secondary | ICD-10-CM | POA: Diagnosis not present

## 2014-10-12 DIAGNOSIS — M545 Low back pain: Secondary | ICD-10-CM | POA: Diagnosis not present

## 2014-10-18 DIAGNOSIS — Z6841 Body Mass Index (BMI) 40.0 and over, adult: Secondary | ICD-10-CM | POA: Diagnosis not present

## 2014-10-18 DIAGNOSIS — M545 Low back pain: Secondary | ICD-10-CM | POA: Diagnosis not present

## 2014-10-18 DIAGNOSIS — M797 Fibromyalgia: Secondary | ICD-10-CM | POA: Diagnosis not present

## 2014-10-18 DIAGNOSIS — R791 Abnormal coagulation profile: Secondary | ICD-10-CM | POA: Diagnosis not present

## 2014-10-18 DIAGNOSIS — E1065 Type 1 diabetes mellitus with hyperglycemia: Secondary | ICD-10-CM | POA: Diagnosis not present

## 2014-10-19 DIAGNOSIS — M545 Low back pain: Secondary | ICD-10-CM | POA: Diagnosis not present

## 2014-10-19 DIAGNOSIS — M797 Fibromyalgia: Secondary | ICD-10-CM | POA: Diagnosis not present

## 2014-11-14 DIAGNOSIS — M545 Low back pain: Secondary | ICD-10-CM | POA: Diagnosis not present

## 2014-11-14 DIAGNOSIS — M797 Fibromyalgia: Secondary | ICD-10-CM | POA: Diagnosis not present

## 2014-11-22 DIAGNOSIS — Z79899 Other long term (current) drug therapy: Secondary | ICD-10-CM | POA: Diagnosis not present

## 2014-11-22 DIAGNOSIS — R791 Abnormal coagulation profile: Secondary | ICD-10-CM | POA: Diagnosis not present

## 2014-11-22 DIAGNOSIS — M545 Low back pain: Secondary | ICD-10-CM | POA: Diagnosis not present

## 2014-11-22 DIAGNOSIS — E782 Mixed hyperlipidemia: Secondary | ICD-10-CM | POA: Diagnosis not present

## 2014-11-22 DIAGNOSIS — I10 Essential (primary) hypertension: Secondary | ICD-10-CM | POA: Diagnosis not present

## 2014-11-22 DIAGNOSIS — Z6841 Body Mass Index (BMI) 40.0 and over, adult: Secondary | ICD-10-CM | POA: Diagnosis not present

## 2014-11-22 DIAGNOSIS — E1065 Type 1 diabetes mellitus with hyperglycemia: Secondary | ICD-10-CM | POA: Diagnosis not present

## 2014-11-22 DIAGNOSIS — E559 Vitamin D deficiency, unspecified: Secondary | ICD-10-CM | POA: Diagnosis not present

## 2014-11-23 DIAGNOSIS — M797 Fibromyalgia: Secondary | ICD-10-CM | POA: Diagnosis not present

## 2014-11-23 DIAGNOSIS — M545 Low back pain: Secondary | ICD-10-CM | POA: Diagnosis not present

## 2014-11-30 DIAGNOSIS — M797 Fibromyalgia: Secondary | ICD-10-CM | POA: Diagnosis not present

## 2014-11-30 DIAGNOSIS — M545 Low back pain: Secondary | ICD-10-CM | POA: Diagnosis not present

## 2014-12-13 DIAGNOSIS — Z79899 Other long term (current) drug therapy: Secondary | ICD-10-CM | POA: Diagnosis not present

## 2014-12-13 DIAGNOSIS — G44229 Chronic tension-type headache, not intractable: Secondary | ICD-10-CM | POA: Diagnosis not present

## 2014-12-13 DIAGNOSIS — Z6841 Body Mass Index (BMI) 40.0 and over, adult: Secondary | ICD-10-CM | POA: Diagnosis not present

## 2014-12-15 DIAGNOSIS — M797 Fibromyalgia: Secondary | ICD-10-CM | POA: Diagnosis not present

## 2014-12-15 DIAGNOSIS — M545 Low back pain: Secondary | ICD-10-CM | POA: Diagnosis not present

## 2014-12-19 DIAGNOSIS — M9902 Segmental and somatic dysfunction of thoracic region: Secondary | ICD-10-CM | POA: Diagnosis not present

## 2014-12-19 DIAGNOSIS — M9903 Segmental and somatic dysfunction of lumbar region: Secondary | ICD-10-CM | POA: Diagnosis not present

## 2014-12-19 DIAGNOSIS — M9901 Segmental and somatic dysfunction of cervical region: Secondary | ICD-10-CM | POA: Diagnosis not present

## 2014-12-19 DIAGNOSIS — M9905 Segmental and somatic dysfunction of pelvic region: Secondary | ICD-10-CM | POA: Diagnosis not present

## 2014-12-20 DIAGNOSIS — Z6841 Body Mass Index (BMI) 40.0 and over, adult: Secondary | ICD-10-CM | POA: Diagnosis not present

## 2014-12-20 DIAGNOSIS — R791 Abnormal coagulation profile: Secondary | ICD-10-CM | POA: Diagnosis not present

## 2014-12-20 DIAGNOSIS — G43909 Migraine, unspecified, not intractable, without status migrainosus: Secondary | ICD-10-CM | POA: Diagnosis not present

## 2014-12-20 DIAGNOSIS — R300 Dysuria: Secondary | ICD-10-CM | POA: Diagnosis not present

## 2014-12-20 DIAGNOSIS — M5135 Other intervertebral disc degeneration, thoracolumbar region: Secondary | ICD-10-CM | POA: Diagnosis not present

## 2014-12-20 DIAGNOSIS — N390 Urinary tract infection, site not specified: Secondary | ICD-10-CM | POA: Diagnosis not present

## 2014-12-20 DIAGNOSIS — M545 Low back pain: Secondary | ICD-10-CM | POA: Diagnosis not present

## 2014-12-20 DIAGNOSIS — E663 Overweight: Secondary | ICD-10-CM | POA: Diagnosis not present

## 2014-12-20 DIAGNOSIS — Z23 Encounter for immunization: Secondary | ICD-10-CM | POA: Diagnosis not present

## 2014-12-20 DIAGNOSIS — M797 Fibromyalgia: Secondary | ICD-10-CM | POA: Diagnosis not present

## 2015-01-02 DIAGNOSIS — F330 Major depressive disorder, recurrent, mild: Secondary | ICD-10-CM | POA: Diagnosis not present

## 2015-01-03 DIAGNOSIS — Z96641 Presence of right artificial hip joint: Secondary | ICD-10-CM | POA: Diagnosis not present

## 2015-01-03 DIAGNOSIS — Z885 Allergy status to narcotic agent status: Secondary | ICD-10-CM | POA: Diagnosis not present

## 2015-01-03 DIAGNOSIS — M1711 Unilateral primary osteoarthritis, right knee: Secondary | ICD-10-CM | POA: Diagnosis not present

## 2015-01-03 DIAGNOSIS — Z471 Aftercare following joint replacement surgery: Secondary | ICD-10-CM | POA: Diagnosis not present

## 2015-01-03 DIAGNOSIS — I10 Essential (primary) hypertension: Secondary | ICD-10-CM | POA: Diagnosis not present

## 2015-01-03 DIAGNOSIS — Z794 Long term (current) use of insulin: Secondary | ICD-10-CM | POA: Diagnosis not present

## 2015-01-03 DIAGNOSIS — Z7901 Long term (current) use of anticoagulants: Secondary | ICD-10-CM | POA: Diagnosis not present

## 2015-01-03 DIAGNOSIS — Z888 Allergy status to other drugs, medicaments and biological substances status: Secondary | ICD-10-CM | POA: Diagnosis not present

## 2015-01-03 DIAGNOSIS — I509 Heart failure, unspecified: Secondary | ICD-10-CM | POA: Diagnosis not present

## 2015-01-03 DIAGNOSIS — Z79899 Other long term (current) drug therapy: Secondary | ICD-10-CM | POA: Diagnosis not present

## 2015-01-03 DIAGNOSIS — E119 Type 2 diabetes mellitus without complications: Secondary | ICD-10-CM | POA: Diagnosis not present

## 2015-01-16 DIAGNOSIS — R262 Difficulty in walking, not elsewhere classified: Secondary | ICD-10-CM | POA: Diagnosis not present

## 2015-01-16 DIAGNOSIS — M1711 Unilateral primary osteoarthritis, right knee: Secondary | ICD-10-CM | POA: Diagnosis not present

## 2015-01-16 DIAGNOSIS — M25561 Pain in right knee: Secondary | ICD-10-CM | POA: Diagnosis not present

## 2015-01-17 DIAGNOSIS — E1065 Type 1 diabetes mellitus with hyperglycemia: Secondary | ICD-10-CM | POA: Diagnosis not present

## 2015-01-17 DIAGNOSIS — N390 Urinary tract infection, site not specified: Secondary | ICD-10-CM | POA: Diagnosis not present

## 2015-01-17 DIAGNOSIS — M797 Fibromyalgia: Secondary | ICD-10-CM | POA: Diagnosis not present

## 2015-01-17 DIAGNOSIS — M5135 Other intervertebral disc degeneration, thoracolumbar region: Secondary | ICD-10-CM | POA: Diagnosis not present

## 2015-01-17 DIAGNOSIS — Z6841 Body Mass Index (BMI) 40.0 and over, adult: Secondary | ICD-10-CM | POA: Diagnosis not present

## 2015-01-17 DIAGNOSIS — R791 Abnormal coagulation profile: Secondary | ICD-10-CM | POA: Diagnosis not present

## 2015-01-17 DIAGNOSIS — M545 Low back pain: Secondary | ICD-10-CM | POA: Diagnosis not present

## 2015-02-08 DIAGNOSIS — M797 Fibromyalgia: Secondary | ICD-10-CM | POA: Diagnosis not present

## 2015-02-08 DIAGNOSIS — M545 Low back pain: Secondary | ICD-10-CM | POA: Diagnosis not present

## 2015-02-14 DIAGNOSIS — M545 Low back pain: Secondary | ICD-10-CM | POA: Diagnosis not present

## 2015-02-14 DIAGNOSIS — M797 Fibromyalgia: Secondary | ICD-10-CM | POA: Diagnosis not present

## 2015-02-15 DIAGNOSIS — Z6841 Body Mass Index (BMI) 40.0 and over, adult: Secondary | ICD-10-CM | POA: Diagnosis not present

## 2015-02-15 DIAGNOSIS — R791 Abnormal coagulation profile: Secondary | ICD-10-CM | POA: Diagnosis not present

## 2015-02-15 DIAGNOSIS — E1065 Type 1 diabetes mellitus with hyperglycemia: Secondary | ICD-10-CM | POA: Diagnosis not present

## 2015-02-15 DIAGNOSIS — M545 Low back pain: Secondary | ICD-10-CM | POA: Diagnosis not present

## 2015-03-16 DIAGNOSIS — J45909 Unspecified asthma, uncomplicated: Secondary | ICD-10-CM | POA: Diagnosis not present

## 2015-03-16 DIAGNOSIS — K219 Gastro-esophageal reflux disease without esophagitis: Secondary | ICD-10-CM | POA: Diagnosis present

## 2015-03-16 DIAGNOSIS — Z8701 Personal history of pneumonia (recurrent): Secondary | ICD-10-CM | POA: Diagnosis not present

## 2015-03-16 DIAGNOSIS — M1990 Unspecified osteoarthritis, unspecified site: Secondary | ICD-10-CM | POA: Diagnosis present

## 2015-03-16 DIAGNOSIS — I509 Heart failure, unspecified: Secondary | ICD-10-CM | POA: Diagnosis present

## 2015-03-16 DIAGNOSIS — R0602 Shortness of breath: Secondary | ICD-10-CM | POA: Diagnosis not present

## 2015-03-16 DIAGNOSIS — Z888 Allergy status to other drugs, medicaments and biological substances status: Secondary | ICD-10-CM | POA: Diagnosis not present

## 2015-03-16 DIAGNOSIS — R1032 Left lower quadrant pain: Secondary | ICD-10-CM | POA: Diagnosis present

## 2015-03-16 DIAGNOSIS — Z794 Long term (current) use of insulin: Secondary | ICD-10-CM | POA: Diagnosis not present

## 2015-03-16 DIAGNOSIS — J9601 Acute respiratory failure with hypoxia: Secondary | ICD-10-CM | POA: Diagnosis not present

## 2015-03-16 DIAGNOSIS — I10 Essential (primary) hypertension: Secondary | ICD-10-CM | POA: Diagnosis not present

## 2015-03-16 DIAGNOSIS — Z8589 Personal history of malignant neoplasm of other organs and systems: Secondary | ICD-10-CM | POA: Diagnosis not present

## 2015-03-16 DIAGNOSIS — J189 Pneumonia, unspecified organism: Secondary | ICD-10-CM | POA: Diagnosis not present

## 2015-03-16 DIAGNOSIS — R1030 Lower abdominal pain, unspecified: Secondary | ICD-10-CM | POA: Diagnosis not present

## 2015-03-16 DIAGNOSIS — I482 Chronic atrial fibrillation: Secondary | ICD-10-CM | POA: Diagnosis not present

## 2015-03-16 DIAGNOSIS — E1140 Type 2 diabetes mellitus with diabetic neuropathy, unspecified: Secondary | ICD-10-CM | POA: Diagnosis not present

## 2015-03-16 DIAGNOSIS — I4891 Unspecified atrial fibrillation: Secondary | ICD-10-CM | POA: Diagnosis not present

## 2015-03-16 DIAGNOSIS — Z7982 Long term (current) use of aspirin: Secondary | ICD-10-CM | POA: Diagnosis not present

## 2015-03-16 DIAGNOSIS — Z7901 Long term (current) use of anticoagulants: Secondary | ICD-10-CM | POA: Diagnosis not present

## 2015-03-16 DIAGNOSIS — J159 Unspecified bacterial pneumonia: Secondary | ICD-10-CM | POA: Diagnosis not present

## 2015-03-16 DIAGNOSIS — Z6841 Body Mass Index (BMI) 40.0 and over, adult: Secondary | ICD-10-CM | POA: Diagnosis not present

## 2015-03-16 DIAGNOSIS — Z79899 Other long term (current) drug therapy: Secondary | ICD-10-CM | POA: Diagnosis not present

## 2015-03-16 DIAGNOSIS — I519 Heart disease, unspecified: Secondary | ICD-10-CM | POA: Diagnosis present

## 2015-03-16 DIAGNOSIS — J9690 Respiratory failure, unspecified, unspecified whether with hypoxia or hypercapnia: Secondary | ICD-10-CM | POA: Diagnosis not present

## 2015-03-16 DIAGNOSIS — R05 Cough: Secondary | ICD-10-CM | POA: Diagnosis not present

## 2015-03-16 DIAGNOSIS — E1149 Type 2 diabetes mellitus with other diabetic neurological complication: Secondary | ICD-10-CM | POA: Diagnosis not present

## 2015-03-29 DIAGNOSIS — Z79899 Other long term (current) drug therapy: Secondary | ICD-10-CM | POA: Diagnosis not present

## 2015-03-29 DIAGNOSIS — Z6841 Body Mass Index (BMI) 40.0 and over, adult: Secondary | ICD-10-CM | POA: Diagnosis not present

## 2015-03-29 DIAGNOSIS — J189 Pneumonia, unspecified organism: Secondary | ICD-10-CM | POA: Diagnosis not present

## 2015-03-29 DIAGNOSIS — M797 Fibromyalgia: Secondary | ICD-10-CM | POA: Diagnosis not present

## 2015-03-29 DIAGNOSIS — G2581 Restless legs syndrome: Secondary | ICD-10-CM | POA: Diagnosis not present

## 2015-03-29 DIAGNOSIS — E1065 Type 1 diabetes mellitus with hyperglycemia: Secondary | ICD-10-CM | POA: Diagnosis not present

## 2015-03-29 DIAGNOSIS — M5135 Other intervertebral disc degeneration, thoracolumbar region: Secondary | ICD-10-CM | POA: Diagnosis not present

## 2015-03-29 DIAGNOSIS — R791 Abnormal coagulation profile: Secondary | ICD-10-CM | POA: Diagnosis not present

## 2015-04-12 DIAGNOSIS — E559 Vitamin D deficiency, unspecified: Secondary | ICD-10-CM | POA: Diagnosis not present

## 2015-04-12 DIAGNOSIS — E1065 Type 1 diabetes mellitus with hyperglycemia: Secondary | ICD-10-CM | POA: Diagnosis not present

## 2015-04-12 DIAGNOSIS — Z6841 Body Mass Index (BMI) 40.0 and over, adult: Secondary | ICD-10-CM | POA: Diagnosis not present

## 2015-04-12 DIAGNOSIS — M5135 Other intervertebral disc degeneration, thoracolumbar region: Secondary | ICD-10-CM | POA: Diagnosis not present

## 2015-04-12 DIAGNOSIS — R791 Abnormal coagulation profile: Secondary | ICD-10-CM | POA: Diagnosis not present

## 2015-04-12 DIAGNOSIS — Z1389 Encounter for screening for other disorder: Secondary | ICD-10-CM | POA: Diagnosis not present

## 2015-04-12 DIAGNOSIS — E782 Mixed hyperlipidemia: Secondary | ICD-10-CM | POA: Diagnosis not present

## 2015-04-12 DIAGNOSIS — Z79899 Other long term (current) drug therapy: Secondary | ICD-10-CM | POA: Diagnosis not present

## 2015-05-01 DIAGNOSIS — F330 Major depressive disorder, recurrent, mild: Secondary | ICD-10-CM | POA: Diagnosis not present

## 2015-05-10 DIAGNOSIS — E1065 Type 1 diabetes mellitus with hyperglycemia: Secondary | ICD-10-CM | POA: Diagnosis not present

## 2015-05-10 DIAGNOSIS — R110 Nausea: Secondary | ICD-10-CM | POA: Diagnosis not present

## 2015-05-10 DIAGNOSIS — E782 Mixed hyperlipidemia: Secondary | ICD-10-CM | POA: Diagnosis not present

## 2015-05-10 DIAGNOSIS — Z6841 Body Mass Index (BMI) 40.0 and over, adult: Secondary | ICD-10-CM | POA: Diagnosis not present

## 2015-05-10 DIAGNOSIS — E559 Vitamin D deficiency, unspecified: Secondary | ICD-10-CM | POA: Diagnosis not present

## 2015-05-10 DIAGNOSIS — E876 Hypokalemia: Secondary | ICD-10-CM | POA: Diagnosis not present

## 2015-05-10 DIAGNOSIS — M5135 Other intervertebral disc degeneration, thoracolumbar region: Secondary | ICD-10-CM | POA: Diagnosis not present

## 2015-05-10 DIAGNOSIS — R791 Abnormal coagulation profile: Secondary | ICD-10-CM | POA: Diagnosis not present

## 2015-06-12 DIAGNOSIS — G2581 Restless legs syndrome: Secondary | ICD-10-CM | POA: Diagnosis not present

## 2015-06-12 DIAGNOSIS — M545 Low back pain: Secondary | ICD-10-CM | POA: Diagnosis not present

## 2015-06-12 DIAGNOSIS — Z7901 Long term (current) use of anticoagulants: Secondary | ICD-10-CM | POA: Diagnosis not present

## 2015-06-12 DIAGNOSIS — Z6841 Body Mass Index (BMI) 40.0 and over, adult: Secondary | ICD-10-CM | POA: Diagnosis not present

## 2015-06-12 DIAGNOSIS — E782 Mixed hyperlipidemia: Secondary | ICD-10-CM | POA: Diagnosis not present

## 2015-06-12 DIAGNOSIS — E1065 Type 1 diabetes mellitus with hyperglycemia: Secondary | ICD-10-CM | POA: Diagnosis not present

## 2015-07-12 DIAGNOSIS — E559 Vitamin D deficiency, unspecified: Secondary | ICD-10-CM | POA: Diagnosis not present

## 2015-07-12 DIAGNOSIS — M545 Low back pain: Secondary | ICD-10-CM | POA: Diagnosis not present

## 2015-07-12 DIAGNOSIS — E1065 Type 1 diabetes mellitus with hyperglycemia: Secondary | ICD-10-CM | POA: Diagnosis not present

## 2015-07-12 DIAGNOSIS — Z7901 Long term (current) use of anticoagulants: Secondary | ICD-10-CM | POA: Diagnosis not present

## 2015-07-12 DIAGNOSIS — Z6841 Body Mass Index (BMI) 40.0 and over, adult: Secondary | ICD-10-CM | POA: Diagnosis not present

## 2015-07-12 DIAGNOSIS — E782 Mixed hyperlipidemia: Secondary | ICD-10-CM | POA: Diagnosis not present

## 2015-07-12 DIAGNOSIS — Z79899 Other long term (current) drug therapy: Secondary | ICD-10-CM | POA: Diagnosis not present

## 2015-08-10 DIAGNOSIS — Z7901 Long term (current) use of anticoagulants: Secondary | ICD-10-CM | POA: Diagnosis not present

## 2015-08-10 DIAGNOSIS — R51 Headache: Secondary | ICD-10-CM | POA: Diagnosis not present

## 2015-08-10 DIAGNOSIS — Z6841 Body Mass Index (BMI) 40.0 and over, adult: Secondary | ICD-10-CM | POA: Diagnosis not present

## 2015-08-10 DIAGNOSIS — E1065 Type 1 diabetes mellitus with hyperglycemia: Secondary | ICD-10-CM | POA: Diagnosis not present

## 2015-08-10 DIAGNOSIS — E782 Mixed hyperlipidemia: Secondary | ICD-10-CM | POA: Diagnosis not present

## 2015-08-10 DIAGNOSIS — M5135 Other intervertebral disc degeneration, thoracolumbar region: Secondary | ICD-10-CM | POA: Diagnosis not present

## 2015-09-18 DIAGNOSIS — Z7901 Long term (current) use of anticoagulants: Secondary | ICD-10-CM | POA: Diagnosis not present

## 2015-09-18 DIAGNOSIS — Z6841 Body Mass Index (BMI) 40.0 and over, adult: Secondary | ICD-10-CM | POA: Diagnosis not present

## 2015-09-18 DIAGNOSIS — M797 Fibromyalgia: Secondary | ICD-10-CM | POA: Diagnosis not present

## 2015-09-18 DIAGNOSIS — M545 Low back pain: Secondary | ICD-10-CM | POA: Diagnosis not present

## 2015-09-18 DIAGNOSIS — E876 Hypokalemia: Secondary | ICD-10-CM | POA: Diagnosis not present

## 2015-09-18 DIAGNOSIS — E782 Mixed hyperlipidemia: Secondary | ICD-10-CM | POA: Diagnosis not present

## 2015-09-18 DIAGNOSIS — E1065 Type 1 diabetes mellitus with hyperglycemia: Secondary | ICD-10-CM | POA: Diagnosis not present

## 2015-09-18 DIAGNOSIS — I482 Chronic atrial fibrillation: Secondary | ICD-10-CM | POA: Diagnosis not present

## 2015-10-19 DIAGNOSIS — E782 Mixed hyperlipidemia: Secondary | ICD-10-CM | POA: Diagnosis not present

## 2015-10-19 DIAGNOSIS — Z7901 Long term (current) use of anticoagulants: Secondary | ICD-10-CM | POA: Diagnosis not present

## 2015-10-19 DIAGNOSIS — Z6841 Body Mass Index (BMI) 40.0 and over, adult: Secondary | ICD-10-CM | POA: Diagnosis not present

## 2015-10-19 DIAGNOSIS — E559 Vitamin D deficiency, unspecified: Secondary | ICD-10-CM | POA: Diagnosis not present

## 2015-10-19 DIAGNOSIS — M545 Low back pain: Secondary | ICD-10-CM | POA: Diagnosis not present

## 2015-10-19 DIAGNOSIS — E041 Nontoxic single thyroid nodule: Secondary | ICD-10-CM | POA: Diagnosis not present

## 2015-10-19 DIAGNOSIS — E1065 Type 1 diabetes mellitus with hyperglycemia: Secondary | ICD-10-CM | POA: Diagnosis not present

## 2015-10-19 DIAGNOSIS — Z1211 Encounter for screening for malignant neoplasm of colon: Secondary | ICD-10-CM | POA: Diagnosis not present

## 2015-10-19 DIAGNOSIS — I10 Essential (primary) hypertension: Secondary | ICD-10-CM | POA: Diagnosis not present

## 2015-10-19 DIAGNOSIS — Z79899 Other long term (current) drug therapy: Secondary | ICD-10-CM | POA: Diagnosis not present

## 2015-11-01 DIAGNOSIS — I482 Chronic atrial fibrillation: Secondary | ICD-10-CM | POA: Diagnosis not present

## 2015-11-01 DIAGNOSIS — I10 Essential (primary) hypertension: Secondary | ICD-10-CM | POA: Diagnosis not present

## 2015-11-01 DIAGNOSIS — E088 Diabetes mellitus due to underlying condition with unspecified complications: Secondary | ICD-10-CM | POA: Diagnosis not present

## 2015-11-01 DIAGNOSIS — R079 Chest pain, unspecified: Secondary | ICD-10-CM | POA: Diagnosis not present

## 2015-11-01 DIAGNOSIS — I509 Heart failure, unspecified: Secondary | ICD-10-CM | POA: Diagnosis not present

## 2015-11-01 DIAGNOSIS — Z01810 Encounter for preprocedural cardiovascular examination: Secondary | ICD-10-CM | POA: Diagnosis not present

## 2015-11-06 DIAGNOSIS — I509 Heart failure, unspecified: Secondary | ICD-10-CM | POA: Diagnosis not present

## 2015-11-06 DIAGNOSIS — I10 Essential (primary) hypertension: Secondary | ICD-10-CM | POA: Diagnosis not present

## 2015-11-06 DIAGNOSIS — R079 Chest pain, unspecified: Secondary | ICD-10-CM | POA: Diagnosis not present

## 2015-11-06 DIAGNOSIS — Z01810 Encounter for preprocedural cardiovascular examination: Secondary | ICD-10-CM | POA: Diagnosis not present

## 2015-11-06 DIAGNOSIS — E088 Diabetes mellitus due to underlying condition with unspecified complications: Secondary | ICD-10-CM | POA: Diagnosis not present

## 2015-11-06 DIAGNOSIS — Z1231 Encounter for screening mammogram for malignant neoplasm of breast: Secondary | ICD-10-CM | POA: Diagnosis not present

## 2015-11-06 DIAGNOSIS — I482 Chronic atrial fibrillation: Secondary | ICD-10-CM | POA: Diagnosis not present

## 2015-11-06 DIAGNOSIS — J9811 Atelectasis: Secondary | ICD-10-CM | POA: Diagnosis not present

## 2015-11-07 DIAGNOSIS — Z1211 Encounter for screening for malignant neoplasm of colon: Secondary | ICD-10-CM | POA: Diagnosis not present

## 2015-11-07 DIAGNOSIS — Z1212 Encounter for screening for malignant neoplasm of rectum: Secondary | ICD-10-CM | POA: Diagnosis not present

## 2015-11-09 DIAGNOSIS — Z9889 Other specified postprocedural states: Secondary | ICD-10-CM | POA: Diagnosis not present

## 2015-11-09 DIAGNOSIS — E785 Hyperlipidemia, unspecified: Secondary | ICD-10-CM | POA: Diagnosis not present

## 2015-11-09 DIAGNOSIS — Z794 Long term (current) use of insulin: Secondary | ICD-10-CM | POA: Diagnosis not present

## 2015-11-09 DIAGNOSIS — M797 Fibromyalgia: Secondary | ICD-10-CM | POA: Diagnosis not present

## 2015-11-09 DIAGNOSIS — E1140 Type 2 diabetes mellitus with diabetic neuropathy, unspecified: Secondary | ICD-10-CM | POA: Diagnosis not present

## 2015-11-09 DIAGNOSIS — I110 Hypertensive heart disease with heart failure: Secondary | ICD-10-CM | POA: Diagnosis not present

## 2015-11-09 DIAGNOSIS — F339 Major depressive disorder, recurrent, unspecified: Secondary | ICD-10-CM | POA: Diagnosis not present

## 2015-11-09 DIAGNOSIS — Z79899 Other long term (current) drug therapy: Secondary | ICD-10-CM | POA: Diagnosis not present

## 2015-11-09 DIAGNOSIS — R079 Chest pain, unspecified: Secondary | ICD-10-CM | POA: Diagnosis not present

## 2015-11-09 DIAGNOSIS — F411 Generalized anxiety disorder: Secondary | ICD-10-CM | POA: Diagnosis not present

## 2015-11-09 DIAGNOSIS — Z8249 Family history of ischemic heart disease and other diseases of the circulatory system: Secondary | ICD-10-CM | POA: Diagnosis not present

## 2015-11-09 DIAGNOSIS — I4891 Unspecified atrial fibrillation: Secondary | ICD-10-CM | POA: Diagnosis not present

## 2015-11-09 DIAGNOSIS — Z7901 Long term (current) use of anticoagulants: Secondary | ICD-10-CM | POA: Diagnosis not present

## 2015-11-09 DIAGNOSIS — I509 Heart failure, unspecified: Secondary | ICD-10-CM | POA: Diagnosis not present

## 2015-11-09 DIAGNOSIS — I2510 Atherosclerotic heart disease of native coronary artery without angina pectoris: Secondary | ICD-10-CM | POA: Diagnosis not present

## 2015-11-09 DIAGNOSIS — G2581 Restless legs syndrome: Secondary | ICD-10-CM | POA: Diagnosis not present

## 2015-11-09 HISTORY — PX: CARDIAC CATHETERIZATION: SHX172

## 2015-11-15 DIAGNOSIS — I509 Heart failure, unspecified: Secondary | ICD-10-CM | POA: Diagnosis not present

## 2015-11-16 DIAGNOSIS — I509 Heart failure, unspecified: Secondary | ICD-10-CM | POA: Diagnosis not present

## 2015-11-20 DIAGNOSIS — I10 Essential (primary) hypertension: Secondary | ICD-10-CM | POA: Diagnosis not present

## 2015-11-20 DIAGNOSIS — Z7901 Long term (current) use of anticoagulants: Secondary | ICD-10-CM | POA: Diagnosis not present

## 2015-11-20 DIAGNOSIS — E1065 Type 1 diabetes mellitus with hyperglycemia: Secondary | ICD-10-CM | POA: Diagnosis not present

## 2015-11-20 DIAGNOSIS — M545 Low back pain: Secondary | ICD-10-CM | POA: Diagnosis not present

## 2015-11-20 DIAGNOSIS — Z6841 Body Mass Index (BMI) 40.0 and over, adult: Secondary | ICD-10-CM | POA: Diagnosis not present

## 2015-11-20 DIAGNOSIS — G2581 Restless legs syndrome: Secondary | ICD-10-CM | POA: Diagnosis not present

## 2015-11-21 DIAGNOSIS — I482 Chronic atrial fibrillation: Secondary | ICD-10-CM | POA: Diagnosis not present

## 2015-11-21 DIAGNOSIS — Z79899 Other long term (current) drug therapy: Secondary | ICD-10-CM | POA: Diagnosis not present

## 2015-11-21 DIAGNOSIS — Z6841 Body Mass Index (BMI) 40.0 and over, adult: Secondary | ICD-10-CM | POA: Diagnosis not present

## 2015-11-21 DIAGNOSIS — I509 Heart failure, unspecified: Secondary | ICD-10-CM | POA: Diagnosis not present

## 2015-11-28 DIAGNOSIS — I509 Heart failure, unspecified: Secondary | ICD-10-CM | POA: Diagnosis not present

## 2015-11-28 DIAGNOSIS — I10 Essential (primary) hypertension: Secondary | ICD-10-CM | POA: Diagnosis not present

## 2015-11-28 DIAGNOSIS — Z6841 Body Mass Index (BMI) 40.0 and over, adult: Secondary | ICD-10-CM | POA: Diagnosis not present

## 2015-12-20 DIAGNOSIS — Z23 Encounter for immunization: Secondary | ICD-10-CM | POA: Diagnosis not present

## 2015-12-20 DIAGNOSIS — M545 Low back pain: Secondary | ICD-10-CM | POA: Diagnosis not present

## 2015-12-20 DIAGNOSIS — I10 Essential (primary) hypertension: Secondary | ICD-10-CM | POA: Diagnosis not present

## 2015-12-20 DIAGNOSIS — N390 Urinary tract infection, site not specified: Secondary | ICD-10-CM | POA: Diagnosis not present

## 2015-12-20 DIAGNOSIS — E1065 Type 1 diabetes mellitus with hyperglycemia: Secondary | ICD-10-CM | POA: Diagnosis not present

## 2015-12-20 DIAGNOSIS — R928 Other abnormal and inconclusive findings on diagnostic imaging of breast: Secondary | ICD-10-CM | POA: Diagnosis not present

## 2015-12-20 DIAGNOSIS — I509 Heart failure, unspecified: Secondary | ICD-10-CM | POA: Diagnosis not present

## 2015-12-20 DIAGNOSIS — Z7901 Long term (current) use of anticoagulants: Secondary | ICD-10-CM | POA: Diagnosis not present

## 2015-12-20 DIAGNOSIS — Z6841 Body Mass Index (BMI) 40.0 and over, adult: Secondary | ICD-10-CM | POA: Diagnosis not present

## 2015-12-20 DIAGNOSIS — M797 Fibromyalgia: Secondary | ICD-10-CM | POA: Diagnosis not present

## 2015-12-20 DIAGNOSIS — G2581 Restless legs syndrome: Secondary | ICD-10-CM | POA: Diagnosis not present

## 2016-01-02 DIAGNOSIS — F411 Generalized anxiety disorder: Secondary | ICD-10-CM | POA: Diagnosis not present

## 2016-01-02 DIAGNOSIS — F330 Major depressive disorder, recurrent, mild: Secondary | ICD-10-CM | POA: Diagnosis not present

## 2016-01-22 DIAGNOSIS — Z6841 Body Mass Index (BMI) 40.0 and over, adult: Secondary | ICD-10-CM | POA: Diagnosis not present

## 2016-01-22 DIAGNOSIS — E559 Vitamin D deficiency, unspecified: Secondary | ICD-10-CM | POA: Diagnosis not present

## 2016-01-22 DIAGNOSIS — E782 Mixed hyperlipidemia: Secondary | ICD-10-CM | POA: Diagnosis not present

## 2016-01-22 DIAGNOSIS — R300 Dysuria: Secondary | ICD-10-CM | POA: Diagnosis not present

## 2016-01-22 DIAGNOSIS — Z7901 Long term (current) use of anticoagulants: Secondary | ICD-10-CM | POA: Diagnosis not present

## 2016-01-22 DIAGNOSIS — M545 Low back pain: Secondary | ICD-10-CM | POA: Diagnosis not present

## 2016-01-22 DIAGNOSIS — E1065 Type 1 diabetes mellitus with hyperglycemia: Secondary | ICD-10-CM | POA: Diagnosis not present

## 2016-01-22 DIAGNOSIS — E041 Nontoxic single thyroid nodule: Secondary | ICD-10-CM | POA: Diagnosis not present

## 2016-01-22 DIAGNOSIS — Z79899 Other long term (current) drug therapy: Secondary | ICD-10-CM | POA: Diagnosis not present

## 2016-02-06 ENCOUNTER — Encounter: Payer: Self-pay | Admitting: Cardiology

## 2016-02-06 ENCOUNTER — Ambulatory Visit (INDEPENDENT_AMBULATORY_CARE_PROVIDER_SITE_OTHER): Payer: Medicare Other | Admitting: Cardiology

## 2016-02-06 ENCOUNTER — Other Ambulatory Visit: Payer: Self-pay | Admitting: Cardiology

## 2016-02-06 VITALS — BP 105/67 | HR 88 | Ht 71.0 in | Wt 294.0 lb

## 2016-02-06 DIAGNOSIS — I5032 Chronic diastolic (congestive) heart failure: Secondary | ICD-10-CM

## 2016-02-06 DIAGNOSIS — Z79899 Other long term (current) drug therapy: Secondary | ICD-10-CM | POA: Diagnosis not present

## 2016-02-06 DIAGNOSIS — I482 Chronic atrial fibrillation, unspecified: Secondary | ICD-10-CM

## 2016-02-06 DIAGNOSIS — G4733 Obstructive sleep apnea (adult) (pediatric): Secondary | ICD-10-CM | POA: Diagnosis not present

## 2016-02-06 DIAGNOSIS — Z01810 Encounter for preprocedural cardiovascular examination: Secondary | ICD-10-CM

## 2016-02-06 MED ORDER — FUROSEMIDE 80 MG PO TABS: 80.0000 mg | ORAL_TABLET | Freq: Two times a day (BID) | ORAL | 3 refills | Status: DC

## 2016-02-06 NOTE — Progress Notes (Signed)
PCP: Guadlupe Spanish, MD  Clinic Note: Chief Complaint  Patient presents with  . New Evaluation    2nd opinion on low blood pressure and weight gain , sob  . Atrial Fibrillation  . Congestive Heart Failure    HPI: RALYNN MCGINNITY is a 65 y.o. female with a PMH below who presents today for Second opinion Cardiology evaluation with a diagnosis of "congestive heart failure " and a long-standing history of atrial fibrillation(Diagnosed 12 years ago) on chronic anticoagulation. She is essentially wanting a different opinion with her "heart failure "management and preoperative "tuneup for potential knee surgery. -- She was previously followed by [facility], then transferred to [facility]. She has been seen at [facility] since last May, but is seeking a different opinion. She actually saw him in May 2016, and then again in August of this year. She did not follow back up again after the May visit last year. She has diabetes mellitus, type II, hypertension and hyperlipidemia. She also has a diagnosis of fibromyalgia major depressive disorder. She is morbidly obese with OSA, but has not yet been started on CPAP.  Sayge Eargle Mcbean was actually seen by [facility] cardiology back in September for symptoms that were concerning for possible class 2-3 angina and class II heart failure. She underwent cardiac catheterization which did not show any significant coronary artery disease, and showed normal EF.  Recent Hospitalizations: Outpatient Cardiac Cath 11/2015  Seen in delayed f/u by Dr. Clearence Ped [facility] - after 15 months from initial visit) for pre-op evaluation for possible knee surgery.  She was noting intermittent CP radiating to her neck & arms --> relieved with rest. SSx concerning for progressive angina, so he decided to proceed with Cardiac Catheterization. (Weight @ that visit was 284 lb), BP 98/46 mmHg   Studies Reviewed:   Cardiac Cath 2015/11/28: ([place]) Minimal, nonobstructive CAD.   EF roughly 55%. EDP 7 mmHg  Transthoracic echo May 2016 - no report available; not sure she actually went.  Transthoracic Echocardiogram with Definity September 2015 ([place]): EF 60-65%. Moderate LA dilation. Mild aortic sclerosis. Normal right heart pressures. Indeterminate filling pattern of left ventricle.  Chest CTA September 2015: Cardiomegaly noted with pulmonary edema and small bilateral effusions noted. Aortic calcification noted suggesting of aortic atherosclerosis.  Interval History: Agnes is a morbidly obese woman who has multiple chronic medical conditions, and is essentially wheelchair-bound being wheeled in by her husband who is also obese -- she indicates that she really can't walk because of significant knee pain. She had hip surgery done on the left side, but was unable to rehabilitation because of her knee pain.  Apparently she is hoping to have knee surgery but needs to be stable asthma cardiac standpoint). She does try to use a walker at home.    Major complaint she has is that she has gained about 12 pounds in pedal edema. She indicates that her normal dry weight has been about 280 pounds where it is currently 294 pounds. His episodes seem to happen in cycles, as this was a similar type of finding when she first started being followed by Dr. Geraldo Pitter @ [facility] (?? Diuretic changed to Metolazone & off of lasix, interestingly, her BNP at the time was 99)  She was last seen by cardiologist in September and there was concern for possible angina, catheterization did not show any significant disease. Then after this, she noted a significant increase in her weight at about 296  pounds. She was told to restart metolazone. She then followed back up and was told that her symptoms are probably not cardiac related  Today she continues to note that her weight is at least 1216 pounds up from her baseline weight. When she went to go see her PCP, she was started on Lasix 80 mg  twice a day and dropped a significant amount of weight, but then when she went back to her original regimen, she was told to the left of metolazone 10 mg but was no longer on furosemide. Now her weight is back up again.  She chronically sleeps in a recliner indicating that she would have significant orthopnea if she lies flat - no real PND symptoms, could she simply just doesn't allow herself to have it.. She has not yet started on CPAP for what is quite likely OSA. She says that her edema is pretty much stable and is always there, but is definitely more now than it has been. She walks around the house with a walker but noticed that she is extremely limited by dyspnea but also the fact that her knees pretty much keeping her wheelchair-bound of late. She could not recover from her hip surgery because her knee was bothering her too much. She still has occasional tightness sensation in her chest when she is short of breath, but it will ease when she sits down to catch her breath. She is chronically in A. fib, and has no sensation of rapid irregular heartbeats at rest, but does note that her heart rate will go up if she tries to exert herself. This is when she gets more short of breath. She has not had any severe near-syncope Symptoms. No TIA or amaurosis fugax symptoms. No significant bleeding issues with warfarin.  No melena, hematochezia, hematuria, or epstaxis. No claudication - cannot assess, because she really doesn't walk.  ROS: A comprehensive was performed. Review of Systems  Constitutional: Positive for malaise/fatigue. Negative for chills, fever and weight loss.  HENT: Negative for congestion and sinus pain.   Eyes: Negative.   Respiratory: Positive for shortness of breath (Chronic, exacerbated with any particular activity). Negative for cough and hemoptysis.   Cardiovascular:       Per history of present illness  Gastrointestinal: Negative for blood in stool, constipation, heartburn and  melena.  Genitourinary: Negative for dysuria and hematuria.  Musculoskeletal: Positive for joint pain (Significant left knee pain, but also both knees and ankles bother) and myalgias (Occasional cramping).  Neurological: Positive for weakness (Generalized). Negative for dizziness and focal weakness.  Endo/Heme/Allergies: Bruises/bleeds easily.  Psychiatric/Behavioral: Positive for depression (Has diagnosis of major depressive disorder and generalized anxiety disorder). Negative for memory loss. The patient does not have insomnia.     Past Medical History:  Diagnosis Date  . Chronic /permanent atrial fibrillation ([place]) 2005   On Warfarin (follwed @ [facility]); Rate controlled - On BB  . Chronic diastolic heart failure, NYHA class 2 ([place])    Updated by A. fib, hypertensive heart disease and obesity; EDP was only 7 by cardiac catheterization in September 2017  . Diabetes mellitus without complication ([employer])   . Diabetic lumbosacral plexopathy ([place])   . Fibromyalgia   . Generalized anxiety disorder   . Major depressive disorder   . Restless leg syndrome     Past Surgical History:  Procedure Laterality Date  . ABDOMINAL HYSTERECTOMY    . CARDIAC CATHETERIZATION  11/09/2015   [facility]: Nonocclusive CAD. EF 55%.  EDP 7 mmHg.  Marland Kitchen CHOLECYSTECTOMY    . KNEE ARTHROSCOPY Right   . ROTATOR CUFF REPAIR Bilateral   . TONSILLECTOMY    . TRANSTHORACIC ECHOCARDIOGRAM  11/2013   [facility]: with Definity:  EF 60-65%. Moderate LA dilation. Mild aortic sclerosis. Normal right heart pressures. Indeterminate filling pattern of left ventricle.    Current Meds  Medication Sig  . acetaminophen (TYLENOL) 500 MG chewable tablet Chew 500 mg by mouth every 6 (six) hours as needed for pain.  Marland Kitchen ALPRAZolam (XANAX) 0.25 MG tablet Take 0.25 mg by mouth 3 (three) times daily as needed for sleep.  . busPIRone (BUSPAR) 5 MG tablet Take 10 mg by mouth 2 (two) times daily.   Marland Kitchen  desvenlafaxine (PRISTIQ) 100 MG 24 hr tablet Take 100 mg by mouth daily.  . Dulaglutide (TRULICITY [place]) Inject 2 mg into the skin once a week.  . ezetimibe (ZETIA) 10 MG tablet Take 10 mg by mouth daily.  Marland Kitchen gabapentin (NEURONTIN) 600 MG tablet Take 600 mg by mouth 3 (three) times daily.  Marland Kitchen guaiFENesin (MUCINEX) 600 MG 12 hr tablet Take by mouth 2 (two) times daily.  . insulin lispro (HUMALOG) 100 UNIT/ML injection Inject into the skin 3 (three) times daily before meals. Sliding scale  . lisinopril (PRINIVIL,ZESTRIL) 20 MG tablet Take 20 mg by mouth daily.  . metolazone (ZAROXOLYN) 10 MG tablet Take 10 mg by mouth daily.  . metoprolol succinate (TOPROL-XL) 50 MG 24 hr tablet Take 50 mg by mouth daily. Take with or immediately following a meal.  . Multiple Vitamins-Minerals (MULTIVITAMIN WITH MINERALS) tablet Take 1 tablet by mouth daily.  . sertraline (ZOLOFT) 100 MG tablet Take 100 mg by mouth daily.  Marland Kitchen sulfamethoxazole-trimethoprim (BACTRIM,SEPTRA) 400-80 MG tablet Take 1 tablet by mouth 2 (two) times daily.  . Turmeric 500 MG CAPS Take by mouth.  . [DISCONTINUED] amLODipine (NORVASC) 5 MG tablet Take 5 mg by mouth daily.  . [DISCONTINUED] potassium chloride SA (K-DUR,KLOR-CON) 20 MEQ tablet Take 20 mEq by mouth 2 (two) times daily.     Allergies  Allergen Reactions  . Bee Venom Hives    Takes benadryl  . Fentanyl Other (See Comments)  . Metformin Diarrhea  . Metformin And Related Diarrhea  . Metformin Hcl Diarrhea  . Other Other (See Comments)    Raw foods with seeds give her "boils".  Avoids raw strawberries, blueberries. Tolerates cooked fruits, tomato sauce, bread/grains with seeds. [facility] 10/17/13 Berries with seeds  . Alteplase Rash  . Cefepime Rash    Social History   Social History  . Marital status: Married    Spouse name: N/A  . Number of children: N/A  . Years of education: N/A   Social History Main Topics  . Smoking status: Never Smoker  . Smokeless tobacco: Never  Used  . Alcohol use No  . Drug use: No  . Sexual activity: Not Asked   Other Topics Concern  . None   Social History Narrative  . None   History reviewed. No pertinent family history.  Wt Readings from Last 3 Encounters:  02/06/16 133.4 kg (294 lb)  09/12/12 120.2 kg (265 lb)    PHYSICAL EXAM BP 105/67 (BP Location: Right Leg, Cuff Size: Large)   Pulse 88   Ht 5\' 11"  (1.803 m)   Wt 133.4 kg (294 lb)   SpO2 93%   BMI 41.00 kg/m  General appearance: alert, cooperative, appears stated age, Morbidly obese; she is pretty chronically ill-appearing,  but in no acute distress. In a wheelchair that has been engineered for control. HEENT: [place]/AT, EOMI, MMM, anicteric sclera - poor dentition Neck: no adenopathy, no carotid bruit and no notable JVD Lungs: clear to auscultation bilaterally, normal percussion bilaterally and non-labored Heart: regular rate and rhythm, S1& S2 distant but normal, no click, rub or gallop; roughly 2/6 SEM at RUSB. Abdomen: soft, non-tender; bowel sounds normal; no masses,  no organomegaly; unable to assess much of anything due to obesity. Extremities: extremities normal, atraumatic, no cyanosis, and edema difficult to assess because of her overall obesity, but appears to be at least 1-2+ Bilaterally Pulses: 2+ and symmetric radial pulses, but very difficult to palpate pedal pulses Skin: mobility and turgor normal and no evidence of bleeding or bruising; no obvious stasis dermatitis noted. No rashes or lesions. Neurologic: Mental status: Alert, oriented, thought content appropriate Cranial nerves: normal (II-XII grossly intact)    Adult ECG Report  Rate: 79 ;  Rhythm: atrial fibrillation and Controlled rate. Cannot exclude anterior MI, age undetermined.; otherwise normal axis, intervals and durations. Borderline low voltage due to body habitus.  Narrative Interpretation: Appears to be relatively stable EKG.   Other studies Reviewed: Additional studies/  records that were reviewed today include:  Recent Labs:  From 11/06/2015   Potassium 3.5, sodium 142, BUN 22, creatinine 0.3. Glucose 218   ASSESSMENT / PLAN: Problem List Items Addressed This Visit    Chronic atrial fibrillation [facility]): CHA2DS2-VASc Score 5; on Warfarin (Chronic)    I'm sure her atrial fibrillation is playing into some of her diastolic dysfunction mediated dyspnea, but it seems that her rate is relatively well-controlled on current dose of beta blocker. By stopping the amlodipine, we may have more room to increase the dose for better rate control.  No bleeding issues on warfarin. This is monitored by her PCPs office.       Relevant Medications   lisinopril (PRINIVIL,ZESTRIL) 20 MG tablet   metoprolol succinate (TOPROL-XL) 50 MG 24 hr tablet   metolazone (ZAROXOLYN) 10 MG tablet   ezetimibe (ZETIA) 10 MG tablet   furosemide (LASIX) 80 MG tablet   Other Relevant Orders   ECHOCARDIOGRAM COMPLETE   Basic metabolic panel   EKG XX123456   Chronic diastolic heart failure (HCC) - Primary (Chronic)    Very difficult to figure out the whole course of events here. She doesn't seem to be very good at following up with anybody. She saw cardiologist in [facility], then the numbness has been seen at [facility]. She is now looking for another opinion here. I don't understand the concept of being on metolazone without a loop diuretic. This does not make any sense to me. The 2 should be used conjunction.  She appears to be at least 10-14 pounds up from her reported dry weight. Does seem to be a little more dyspneic than usual, is really hard to tell because she is essentially inactive.  She has not had an echocardiogram as I can tell since 2015 (achieved didn't get the one done that was ordered last year) every assessment of her EF would suggest that his diastolic dysfunction although her EDP on cath was 7 which makes me think that a lot of her edema and dyspnea is probably related more to  obesity hypoventilation syndrome.   Plan:   Check 2-D echocardiogram, will likely need Definity contrast.   As far as her medication management without diuretic she is on afterload reduction with lisinopril 20 mg daily and  Toprol at 50 mg.   She is also on amlodipine which I think we can stop due to her hypotension.  I would like for her to go back on furosemide with recommendations noted in patient instructions below.  Essentially she is to go back to 80mg  twice a day Lasix with predosing with 5 mg metolazone for least a week or until her weight is back down to what is about her dry weigh of what she estimated to be 284 pounds.  When she reaches that weight, she would then drop metolazone and use that only when necessary increased weight. If she is stable at her weight on 80mg   twice a day after a week or 2, she will simply drop the second dose and see how her weights go.  If she gains more than 3 pounds in a day, she would use when necessary metolazone with the first dose of Lasix to see if her weight goes down.  If that doesn't happen then we would probably add the additional dose of Lasix.  She has had a history of hypokalemia, therefore we will need to increase her potassium supplementation. She will need to take 2 of her 20 mg tablets of K-Dur with each 80 mg dose of furosemide.  With all of this diuretic adjustment, we will need to reassess her chemistry panel about a week into it       Relevant Medications   lisinopril (PRINIVIL,ZESTRIL) 20 MG tablet   metoprolol succinate (TOPROL-XL) 50 MG 24 hr tablet   metolazone (ZAROXOLYN) 10 MG tablet   ezetimibe (ZETIA) 10 MG tablet   furosemide (LASIX) 80 MG tablet   Other Relevant Orders   ECHOCARDIOGRAM COMPLETE   Basic metabolic panel   EKG XX123456   Morbid obesity (HCC) (Chronic)    Unfortunately this is a double-edged sword, she really can't walk because her knee is so bad, she needs to get her knee fixed, but her obesity makes  her a higher risk candidate for surgery.  I counseled her the importance of dietary modification she has to adjust her diet if she can exercise.      Relevant Medications   Dulaglutide (TRULICITY [place])   insulin lispro (HUMALOG) 100 UNIT/ML injection   Other Relevant Orders   ECHOCARDIOGRAM COMPLETE   Basic metabolic panel   EKG [id number]   OSA (obstructive sleep apnea) (Chronic)    Clinically she appears to have OSA and probably obesity hypoventilation syndrome. I'm curious to see what her echo cardiac exam shows as far as right heart pressures go. I think a great deal of her dyspnea and edema is more related to elevated right heart pressures from obesity and ventilation syndrome. The fact that her LVEDP was only 7 mmHg means that her diastolic dysfunction can't be that bad.      Relevant Orders   ECHOCARDIOGRAM COMPLETE   Basic metabolic panel   EKG [id number]   Medication management (Chronic)    We'll need to follow renal function and potassium with increased diuretic      Relevant Orders   ECHOCARDIOGRAM COMPLETE   Basic metabolic panel   EKG [id number]   Pre-operative cardiovascular examination    So currently she has what seems to be decompensated heart failure, although were not sure if this is right sided or left-sided heart failure but is not thought heart failure. Although she may have some intermittent chest discomfort, it is probably not a macrovascular anginal symptoms. Could easily be diastolic dysfunction or right-sided  heart failure mediated microvascular ischemia. She has diabetes on insulin, but has normal renal function. She has never had a stroke, and knee surgery is low risk.  We would not need to do a stress test as she is just had a heart catheterization indicating no significant coronary disease. An echocardiogram will help to get a better assessment of her right-sided pressures and diastolic filling parameters.  PREOPERATIVE CARDIAC RISK ASSESSMENT   Revised  Cardiac Risk Index:  High Risk Surgery: no; KNEE SURGERY IS LOW RISK  Defined as Intraperitoneal, intrathoracic or suprainguinal vascular  Active CAD: no; just had cardiac cath with no significant CAD  CHF: yes; HFPEF with likely OSA/OHS complication (? Right Sided failure).  Cerebrovascular Disease: no;   Diabetes: yes; On Insulin: yes  CKD (Cr >~ 2): no; ~1.2  Total: 1 Estimated Risk of Adverse Outcome: LOW RISK, for LOW RISK Surgery -- we will try to treat CHF in order to make her compensated.   Estimated Risk of MI, PE, VF/VT (Cardiac Arrest), Complete Heart Block: <1 % & reduced due to BB. Cannot predict risk from a Pulmonary/Obesity Hypoventilation standpoint.       Relevant Orders   ECHOCARDIOGRAM COMPLETE   Basic metabolic panel   EKG XX123456     This is a very difficult visit. The patient is a very convoluted history with multiple different cardiologist in multiple locations requiring care everywhere reviewing the chart dating back to 2013. It took me well over an hour simply to do the chart review. The visit itself was at least 40 minutes in direct patient contact. Over 50% of this time was in direct consultation.  Current medicines are reviewed at length with the patient today. (+/- concerns) ?? Not sure about her diuretic Rx The following changes have been made: see below.  Patient Instructions  MEDICATION CHANGES  STOP AMLODIPINE  FOR THE NEXT 7 DAYS--TAKE METOLAZONE 10 MG  30 MIN  IN THE MORNING BEFORE MORNING DOSE OF 80 MG FUROSEMIDE. TAKE 80 MG TWICE A DAY.  YOUR DRY WEIGHT  SHOULD 284 LBS. WHEN DRY WEIGHT IS 284 LBS THEN 80  MG TWICE A DAY ONLY. WHEN WEIGHT STABLIZE FOR SEVERAL DAYS   THEN TAKE 80 MG OF FUROSEMIDE ONE TABLET  DAILY  ONLY USE METOLAZONE AS NEEDED TO GET WEIGHT BACK TO 284 LBS.  TAKE POTASSIUM 2 TABLET TWICE A DAY WHILE YOU USE FUROSIMIDE.  LABS PLEASE HAVE LAB DONE NEXT WEEK ON  Thursday or Friday- anytime time of the day- you do  not needed to be fasting.   Schedule at [facility] has requested that you have an echocardiogram. Echocardiography is a painless test that uses sound waves to create images of your heart. It provides your doctor with information about the size and shape of your heart and how well your heart's chambers and valves are working. This procedure takes approximately one hour. There are no restrictions for this procedure.    Your physician recommends that you schedule a follow-up appointment in earlier   JAN 2018 with DR HARDING- 30 MIN    If you need a refill on your cardiac medications before your next appointment, please call your pharmacy.   Studies Ordered:   Orders Placed This Encounter  Procedures  . Basic metabolic panel  . EKG 12-Lead  . ECHOCARDIOGRAM COMPLETE      Glenetta Hew, M.D., M.S. Interventional Cardiologist   Pager # [phone] Phone # [phone] [address]  [place]. [place], [place] [address]

## 2016-02-06 NOTE — Patient Instructions (Addendum)
MEDICATION CHANGES  STOP AMLODIPINE  FOR THE NEXT 7 DAYS--TAKE METOLAZONE 10 MG  30 MIN  IN THE MORNING BEFORE MORNING DOSE OF 80 MG FUROSEMIDE. TAKE 80 MG TWICE A DAY.  YOUR DRY WEIGHT  SHOULD 284 LBS. WHEN DRY WEIGHT IS 284 LBS THEN 80  MG TWICE A DAY ONLY. WHEN WEIGHT STABLIZE FOR SEVERAL DAYS   THEN TAKE 80 MG OF FUROSEMIDE ONE TABLET  DAILY  ONLY USE METOLAZONE AS NEEDED TO GET WEIGHT BACK TO 284 LBS.  TAKE POTASSIUM 2 TABLET TWICE A DAY WHILE YOU USE FUROSIMIDE.  LABS PLEASE HAVE LAB DONE NEXT WEEK ON  Thursday or Friday- anytime time of the day- you do not needed to be fasting.   Schedule at [facility] has requested that you have an echocardiogram. Echocardiography is a painless test that uses sound waves to create images of your heart. It provides your doctor with information about the size and shape of your heart and how well your heart's chambers and valves are working. This procedure takes approximately one hour. There are no restrictions for this procedure.    Your physician recommends that you schedule a follow-up appointment in earlier   JAN 2018 with DR HARDING- 30 MIN    If you need a refill on your cardiac medications before your next appointment, please call your pharmacy.

## 2016-02-07 ENCOUNTER — Telehealth: Payer: Self-pay | Admitting: Cardiology

## 2016-02-07 MED ORDER — POTASSIUM CHLORIDE CRYS ER 20 MEQ PO TBCR: EXTENDED_RELEASE_TABLET | ORAL | 3 refills | Status: DC

## 2016-02-07 NOTE — Telephone Encounter (Signed)
Returned call to patient.She stated Dr.Harding increased Kdur 20 meq 2 tablets twice a day.Stated she needed prescription sent to pharmacy.Prescription sent to [facility].Stated she will have repeat potassium checked in 1 week.

## 2016-02-07 NOTE — Telephone Encounter (Signed)
New message      [facility] that [facility] [place] [facility] not [facility] pres[facility] for klor-con.  She was seen yesterday and was told to start this medication. Please call in presc and if pt is not to start it, please call pt

## 2016-02-08 ENCOUNTER — Other Ambulatory Visit: Payer: Self-pay | Admitting: Cardiology

## 2016-02-08 ENCOUNTER — Encounter: Payer: Self-pay | Admitting: Cardiology

## 2016-02-08 DIAGNOSIS — Z79899 Other long term (current) drug therapy: Secondary | ICD-10-CM | POA: Insufficient documentation

## 2016-02-08 DIAGNOSIS — I4821 Permanent atrial fibrillation: Secondary | ICD-10-CM | POA: Insufficient documentation

## 2016-02-08 DIAGNOSIS — Z01810 Encounter for preprocedural cardiovascular examination: Secondary | ICD-10-CM | POA: Insufficient documentation

## 2016-02-08 DIAGNOSIS — G4733 Obstructive sleep apnea (adult) (pediatric): Secondary | ICD-10-CM | POA: Insufficient documentation

## 2016-02-08 DIAGNOSIS — I5032 Chronic diastolic (congestive) heart failure: Secondary | ICD-10-CM | POA: Insufficient documentation

## 2016-02-08 DIAGNOSIS — I482 Chronic atrial fibrillation, unspecified: Secondary | ICD-10-CM | POA: Insufficient documentation

## 2016-02-08 NOTE — Assessment & Plan Note (Signed)
We'll need to follow renal function and potassium with increased diuretic

## 2016-02-08 NOTE — Assessment & Plan Note (Signed)
Very difficult to figure out the whole course of events here. She doesn't seem to be very good at following up with anybody. She saw cardiologist in [facility], then the numbness has been seen at [facility]. She is now looking for another opinion here. I don't understand the concept of being on metolazone without a loop diuretic. This does not make any sense to me. The 2 should be used conjunction.  She appears to be at least 10-14 pounds up from her reported dry weight. Does seem to be a little more dyspneic than usual, is really hard to tell because she is essentially inactive.  She has not had an echocardiogram as I can tell since 2015 (achieved didn't get the one done that was ordered last year) every assessment of her EF would suggest that his diastolic dysfunction although her EDP on cath was 7 which makes me think that a lot of her edema and dyspnea is probably related more to obesity hypoventilation syndrome.   Plan:   Check 2-D echocardiogram, will likely need Definity contrast.   As far as her medication management without diuretic she is on afterload reduction with lisinopril 20 mg daily and Toprol at 50 mg.   She is also on amlodipine which I think we can stop due to her hypotension.  I would like for her to go back on furosemide with recommendations noted in patient instructions below.  Essentially she is to go back to 80mg  twice a day Lasix with predosing with 5 mg metolazone for least a week or until her weight is back down to what is about her dry weigh of what she estimated to be 284 pounds.  When she reaches that weight, she would then drop metolazone and use that only when necessary increased weight. If she is stable at her weight on 80mg   twice a day after a week or 2, she will simply drop the second dose and see how her weights go.  If she gains more than 3 pounds in a day, she would use when necessary metolazone with the first dose of Lasix to see if her weight goes down.  If that  doesn't happen then we would probably add the additional dose of Lasix.  She has had a history of hypokalemia, therefore we will need to increase her potassium supplementation. She will need to take 2 of her 20 mg tablets of K-Dur with each 80 mg dose of furosemide.  With all of this diuretic adjustment, we will need to reassess her chemistry panel about a week into it

## 2016-02-08 NOTE — Assessment & Plan Note (Signed)
Clinically she appears to have OSA and probably obesity hypoventilation syndrome. I'm curious to see what her echo cardiac exam shows as far as right heart pressures go. I think a great deal of her dyspnea and edema is more related to elevated right heart pressures from obesity and ventilation syndrome. The fact that her LVEDP was only 7 mmHg means that her diastolic dysfunction can't be that bad.

## 2016-02-08 NOTE — Assessment & Plan Note (Addendum)
I'm sure her atrial fibrillation is playing into some of her diastolic dysfunction mediated dyspnea, but it seems that her rate is relatively well-controlled on current dose of beta blocker. By stopping the amlodipine, we may have more room to increase the dose for better rate control.  No bleeding issues on warfarin. This is monitored by her PCPs office.

## 2016-02-08 NOTE — Assessment & Plan Note (Signed)
Unfortunately this is a double-edged sword, she really can't walk because her knee is so bad, she needs to get her knee fixed, but her obesity makes her a higher risk candidate for surgery.  I counseled her the importance of dietary modification she has to adjust her diet if she can exercise.

## 2016-02-08 NOTE — Assessment & Plan Note (Signed)
So currently she has what seems to be decompensated heart failure, although were not sure if this is right sided or left-sided heart failure but is not thought heart failure. Although she may have some intermittent chest discomfort, it is probably not a macrovascular anginal symptoms. Could easily be diastolic dysfunction or right-sided heart failure mediated microvascular ischemia. She has diabetes on insulin, but has normal renal function. She has never had a stroke, and knee surgery is low risk.  We would not need to do a stress test as she is just had a heart catheterization indicating no significant coronary disease. An echocardiogram will help to get a better assessment of her right-sided pressures and diastolic filling parameters.  PREOPERATIVE CARDIAC RISK ASSESSMENT   Revised Cardiac Risk Index:  High Risk Surgery: no; KNEE SURGERY IS LOW RISK  Defined as Intraperitoneal, intrathoracic or suprainguinal vascular  Active CAD: no; just had cardiac cath with no significant CAD  CHF: yes; HFPEF with likely OSA/OHS complication (? Right Sided failure).  Cerebrovascular Disease: no;   Diabetes: yes; On Insulin: yes  CKD (Cr >~ 2): no; ~1.2  Total: 1 Estimated Risk of Adverse Outcome: LOW RISK, for LOW RISK Surgery -- we will try to treat CHF in order to make her compensated.   Estimated Risk of MI, PE, VF/VT (Cardiac Arrest), Complete Heart Block: <1 % & reduced due to BB. Cannot predict risk from a Pulmonary/Obesity Hypoventilation standpoint.

## 2016-02-15 DIAGNOSIS — Z79899 Other long term (current) drug therapy: Secondary | ICD-10-CM | POA: Diagnosis not present

## 2016-02-15 DIAGNOSIS — Z01810 Encounter for preprocedural cardiovascular examination: Secondary | ICD-10-CM | POA: Diagnosis not present

## 2016-02-15 DIAGNOSIS — I482 Chronic atrial fibrillation: Secondary | ICD-10-CM | POA: Diagnosis not present

## 2016-02-15 DIAGNOSIS — I5032 Chronic diastolic (congestive) heart failure: Secondary | ICD-10-CM | POA: Diagnosis not present

## 2016-02-15 DIAGNOSIS — G4733 Obstructive sleep apnea (adult) (pediatric): Secondary | ICD-10-CM | POA: Diagnosis not present

## 2016-02-16 LAB — BASIC METABOLIC PANEL
BUN: 38 mg/dL — AB (ref 7–25)
CO2: 29 mmol/L (ref 20–31)
CREATININE: 1.24 mg/dL — AB (ref 0.50–0.99)
Calcium: 9.8 mg/dL (ref 8.6–10.4)
Chloride: 95 mmol/L — ABNORMAL LOW (ref 98–110)
GLUCOSE: 276 mg/dL — AB (ref 65–99)
POTASSIUM: 3.8 mmol/L (ref 3.5–5.3)
Sodium: 139 mmol/L (ref 135–146)

## 2016-02-19 ENCOUNTER — Telehealth: Payer: Self-pay | Admitting: Cardiology

## 2016-02-19 DIAGNOSIS — E1065 Type 1 diabetes mellitus with hyperglycemia: Secondary | ICD-10-CM | POA: Diagnosis not present

## 2016-02-19 DIAGNOSIS — M79674 Pain in right toe(s): Secondary | ICD-10-CM | POA: Diagnosis not present

## 2016-02-19 DIAGNOSIS — I10 Essential (primary) hypertension: Secondary | ICD-10-CM | POA: Diagnosis not present

## 2016-02-19 DIAGNOSIS — M545 Low back pain: Secondary | ICD-10-CM | POA: Diagnosis not present

## 2016-02-19 DIAGNOSIS — M79671 Pain in right foot: Secondary | ICD-10-CM | POA: Diagnosis not present

## 2016-02-19 DIAGNOSIS — Z79899 Other long term (current) drug therapy: Secondary | ICD-10-CM

## 2016-02-19 DIAGNOSIS — S99921A Unspecified injury of right foot, initial encounter: Secondary | ICD-10-CM | POA: Diagnosis not present

## 2016-02-19 DIAGNOSIS — Z7901 Long term (current) use of anticoagulants: Secondary | ICD-10-CM | POA: Diagnosis not present

## 2016-02-19 DIAGNOSIS — Z6841 Body Mass Index (BMI) 40.0 and over, adult: Secondary | ICD-10-CM | POA: Diagnosis not present

## 2016-02-19 DIAGNOSIS — E782 Mixed hyperlipidemia: Secondary | ICD-10-CM | POA: Diagnosis not present

## 2016-02-19 NOTE — Telephone Encounter (Signed)
Will forward to Dr Ellyn Hack for review

## 2016-02-19 NOTE — Telephone Encounter (Signed)
Results of now been reviewed.

## 2016-02-19 NOTE — Telephone Encounter (Signed)
New message  Pt is calling in reference to blood work results  Please call back

## 2016-02-20 NOTE — Telephone Encounter (Signed)
-----   Message from Leonie Man, MD sent at 02/19/2016  6:36 PM EST ----- Overall, chemistry panel looks relatively stable. Kidney function would suggest a little bit dehydrated.. This is probably related to having increased diuretic. Will return back to the baseline 80 twice a day Lasix with as needed Zaroxolyn.  Can recheck it least one more time for see her back.  More glaringly, is the high glucose level of 276 indicating inadequate glycemic control.  Pls forward to Dr. Charna Archer (PCP)

## 2016-02-20 NOTE — Telephone Encounter (Signed)
Spoke to patient. Result given . Verbalized understanding Patient states she eat right before having lab done Lab will be done the third week of jan 2018 Routed info to primary

## 2016-02-28 ENCOUNTER — Other Ambulatory Visit: Payer: Self-pay

## 2016-02-28 ENCOUNTER — Ambulatory Visit (HOSPITAL_COMMUNITY): Payer: Medicare Other | Attending: Cardiovascular Disease

## 2016-02-28 DIAGNOSIS — I5032 Chronic diastolic (congestive) heart failure: Secondary | ICD-10-CM | POA: Diagnosis not present

## 2016-02-28 DIAGNOSIS — I110 Hypertensive heart disease with heart failure: Secondary | ICD-10-CM | POA: Insufficient documentation

## 2016-02-28 DIAGNOSIS — I2510 Atherosclerotic heart disease of native coronary artery without angina pectoris: Secondary | ICD-10-CM | POA: Insufficient documentation

## 2016-02-28 DIAGNOSIS — E785 Hyperlipidemia, unspecified: Secondary | ICD-10-CM | POA: Insufficient documentation

## 2016-02-28 DIAGNOSIS — Z6841 Body Mass Index (BMI) 40.0 and over, adult: Secondary | ICD-10-CM | POA: Diagnosis not present

## 2016-02-28 DIAGNOSIS — E119 Type 2 diabetes mellitus without complications: Secondary | ICD-10-CM | POA: Insufficient documentation

## 2016-02-28 DIAGNOSIS — G4733 Obstructive sleep apnea (adult) (pediatric): Secondary | ICD-10-CM | POA: Diagnosis not present

## 2016-02-28 DIAGNOSIS — I482 Chronic atrial fibrillation, unspecified: Secondary | ICD-10-CM

## 2016-02-28 DIAGNOSIS — Z01810 Encounter for preprocedural cardiovascular examination: Secondary | ICD-10-CM

## 2016-02-28 DIAGNOSIS — Z79899 Other long term (current) drug therapy: Secondary | ICD-10-CM

## 2016-03-10 ENCOUNTER — Telehealth: Payer: Self-pay | Admitting: Cardiology

## 2016-03-10 MED ORDER — LISINOPRIL 20 MG PO TABS: 20.0000 mg | ORAL_TABLET | Freq: Every day | ORAL | 11 refills | Status: DC

## 2016-03-10 NOTE — Telephone Encounter (Signed)
°*  STAT* If patient is at the pharmacy, call can be transferred to refill team.   1. Which medications need to be refilled? (please list name of each medication and dose if known) lisinopril 20mg   2. Which pharmacy/location (including street and city if local pharmacy) is medication to be sent to? [facility] [place] [address] 3. Do they need a 30 day or 90 day supply? [place]

## 2016-03-10 NOTE — Telephone Encounter (Signed)
Rx(s) sent to pharmacy electronically.  

## 2016-03-18 ENCOUNTER — Ambulatory Visit: Payer: BLUE CROSS/BLUE SHIELD | Admitting: Cardiology

## 2016-03-18 DIAGNOSIS — E782 Mixed hyperlipidemia: Secondary | ICD-10-CM | POA: Diagnosis not present

## 2016-03-18 DIAGNOSIS — M545 Low back pain: Secondary | ICD-10-CM | POA: Diagnosis not present

## 2016-03-18 DIAGNOSIS — E876 Hypokalemia: Secondary | ICD-10-CM | POA: Diagnosis not present

## 2016-03-18 DIAGNOSIS — R110 Nausea: Secondary | ICD-10-CM | POA: Diagnosis not present

## 2016-03-18 DIAGNOSIS — I482 Chronic atrial fibrillation: Secondary | ICD-10-CM | POA: Diagnosis not present

## 2016-03-18 DIAGNOSIS — R251 Tremor, unspecified: Secondary | ICD-10-CM | POA: Diagnosis not present

## 2016-03-18 DIAGNOSIS — E1065 Type 1 diabetes mellitus with hyperglycemia: Secondary | ICD-10-CM | POA: Diagnosis not present

## 2016-03-18 DIAGNOSIS — Z6841 Body Mass Index (BMI) 40.0 and over, adult: Secondary | ICD-10-CM | POA: Diagnosis not present

## 2016-03-18 DIAGNOSIS — M170 Bilateral primary osteoarthritis of knee: Secondary | ICD-10-CM | POA: Diagnosis not present

## 2016-03-18 DIAGNOSIS — Z7901 Long term (current) use of anticoagulants: Secondary | ICD-10-CM | POA: Diagnosis not present

## 2016-03-24 DIAGNOSIS — Z79899 Other long term (current) drug therapy: Secondary | ICD-10-CM | POA: Diagnosis not present

## 2016-03-25 LAB — BASIC METABOLIC PANEL
BUN: 66 mg/dL — ABNORMAL HIGH (ref 7–25)
CHLORIDE: 99 mmol/L (ref 98–110)
CO2: 30 mmol/L (ref 20–31)
Calcium: 9.4 mg/dL (ref 8.6–10.4)
Creat: 1.36 mg/dL — ABNORMAL HIGH (ref 0.50–0.99)
Glucose, Bld: 198 mg/dL — ABNORMAL HIGH (ref 65–99)
Potassium: 3.8 mmol/L (ref 3.5–5.3)
SODIUM: 138 mmol/L (ref 135–146)

## 2016-03-26 ENCOUNTER — Ambulatory Visit (INDEPENDENT_AMBULATORY_CARE_PROVIDER_SITE_OTHER): Payer: BLUE CROSS/BLUE SHIELD | Admitting: Cardiology

## 2016-03-26 DIAGNOSIS — H524 Presbyopia: Secondary | ICD-10-CM | POA: Diagnosis not present

## 2016-03-26 DIAGNOSIS — H2513 Age-related nuclear cataract, bilateral: Secondary | ICD-10-CM | POA: Diagnosis not present

## 2016-03-26 DIAGNOSIS — Z79899 Other long term (current) drug therapy: Secondary | ICD-10-CM

## 2016-03-26 DIAGNOSIS — I5032 Chronic diastolic (congestive) heart failure: Secondary | ICD-10-CM | POA: Diagnosis not present

## 2016-03-26 DIAGNOSIS — I482 Chronic atrial fibrillation, unspecified: Secondary | ICD-10-CM

## 2016-03-26 DIAGNOSIS — Z01810 Encounter for preprocedural cardiovascular examination: Secondary | ICD-10-CM | POA: Diagnosis not present

## 2016-03-26 DIAGNOSIS — G4733 Obstructive sleep apnea (adult) (pediatric): Secondary | ICD-10-CM | POA: Diagnosis not present

## 2016-03-26 DIAGNOSIS — Z794 Long term (current) use of insulin: Secondary | ICD-10-CM | POA: Diagnosis not present

## 2016-03-26 DIAGNOSIS — H52203 Unspecified astigmatism, bilateral: Secondary | ICD-10-CM | POA: Diagnosis not present

## 2016-03-26 DIAGNOSIS — H40003 Preglaucoma, unspecified, bilateral: Secondary | ICD-10-CM | POA: Diagnosis not present

## 2016-03-26 DIAGNOSIS — E119 Type 2 diabetes mellitus without complications: Secondary | ICD-10-CM | POA: Diagnosis not present

## 2016-03-26 DIAGNOSIS — H5203 Hypermetropia, bilateral: Secondary | ICD-10-CM | POA: Diagnosis not present

## 2016-03-26 MED ORDER — LISINOPRIL 10 MG PO TABS: 10.0000 mg | ORAL_TABLET | Freq: Every day | ORAL | 3 refills | Status: DC

## 2016-03-26 MED ORDER — METOPROLOL SUCCINATE ER 50 MG PO TB24: 50.0000 mg | ORAL_TABLET | Freq: Every day | ORAL | 3 refills | Status: DC

## 2016-03-26 MED ORDER — LISINOPRIL 10 MG PO TABS: 10.0000 mg | ORAL_TABLET | Freq: Every day | ORAL | 4 refills | Status: DC

## 2016-03-26 NOTE — Progress Notes (Signed)
PCP: Guadlupe Spanish, MD  Clinic Note: Chief Complaint  Patient presents with  . Follow-up    pt denied chest pain, pt c/o trimmers where her arms and legs shake  . Leg Swelling    Has diagnosis of CHF - follow-up echo    HPI: CARLIS BURNSWORTH is a 66 y.o. female with a PMH below who presents today for Follow-up after her initial consultation visit on 02/06/2016. She has been evaluated at [facility] as well as [facility]. Prior to that she is being followed by [facility] is now seeing her third of not fourth cardiologist. She has carried a diagnosis of congestive heart failure, however she has had essentially normal echocardiographic evaluation and also recent cardiac cath that was negative for any ischemia. She basically has OSA and likely obesity hypoventilation syndrome along with chronic lower extremity edema that I think is probably more related to venous stasis and potentially right-sided failure however the echocardiogram did not suggest it. Just to reassess her EF and right-sided pressures we did recheck an echocardiogram. I also had her start taking her 80 mg furosemide twice a day with metolazone prior to her first dose. We talked by sliding scale.  Recent Hospitalizations: None since September 2017  Studies Reviewed: 2-D Echocardiogram 02/20/2016: Past Surgical History:  Procedure Laterality Date  . ABDOMINAL HYSTERECTOMY    . CARDIAC CATHETERIZATION  11/09/2015   [facility]: Nonocclusive CAD. EF 55%. EDP 7 mmHg.  Marland Kitchen CHOLECYSTECTOMY    . KNEE ARTHROSCOPY Right   . ROTATOR CUFF REPAIR Bilateral   . TONSILLECTOMY    . TRANSTHORACIC ECHOCARDIOGRAM  11/2013; 02/2016   a) [facility]: with Definity:  EF 60-65%. Mod LA dilation. Mild AoV sclerosis. Normal RHP. Indeterminate LV filling pressures. b) [facility]: Normal cavity size. EF 55-60%.no RWMA, mild AoV sclerosis (R cusp), Mod MAC. Mild biAtrial dilation.  Cardiac Cath 11/09/2015: ([place]) Minimal, nonobstructive CAD.  EF roughly 55%. EDP 7 mmHg  Transthoracic echo May 2016 - no report available; not sure she actually went.  Transthoracic Echocardiogram with Definity September 2015 ([place]): EF 60-65%. Moderate LA dilation. Mild aortic sclerosis. Normal right heart pressures. Indeterminate filling pattern of left ventricle.  Chest CTA September 2015: Cardiomegaly noted with pulmonary edema and small bilateral effusions noted. Aortic calcification noted suggesting of aortic atherosclerosis.  Current Meds  Medication Sig  . acetaminophen (TYLENOL) 500 MG chewable tablet Chew 500 mg by mouth every 6 (six) hours as needed for pain.  Marland Kitchen ALPRAZolam (XANAX) 0.25 MG tablet Take 0.25 mg by mouth 3 (three) times daily as needed for sleep.  Marland Kitchen aspirin 81 MG tablet Take 81 mg by mouth daily.  . busPIRone (BUSPAR) 5 MG tablet Take 10 mg by mouth 2 (two) times daily.   . Cholecalciferol (VITAMIN D) 400 UNITS capsule Take 800 Units by mouth daily.  Marland Kitchen desvenlafaxine (PRISTIQ) 100 MG 24 hr tablet Take 100 mg by mouth daily.  . Dulaglutide (TRULICITY [place]) Inject 2 mg into the skin once a week.  . ezetimibe (ZETIA) 10 MG tablet Take 10 mg by mouth daily.  . ferrous sulfate 325 (65 FE) MG tablet Take 325 mg by mouth 2 (two) times daily.  . furosemide (LASIX) 80 MG tablet Take 1 tablet (80 mg total) by mouth 2 (two) times daily. AND AS DIRECTED  . gabapentin (NEURONTIN) 600 MG tablet Take 600 mg by mouth 3 (three) times daily.  Marland Kitchen guaiFENesin (MUCINEX) 600  MG 12 hr tablet Take by mouth 2 (two) times daily.  . insulin detemir (LEVEMIR) 100 UNIT/ML injection Inject 42 Units into the skin at bedtime.  . insulin lispro (HUMALOG) 100 UNIT/ML injection Inject into the skin 3 (three) times daily before meals. Sliding scale  . Insulin Pen Needle (NOVOTWIST) 32G X 5 MM MISC by Does not apply route.  . lidocaine (LIDODERM) 5 % Place 1 patch onto the skin daily. Remove & Discard patch  within 12 hours or as directed by MD  . magnesium oxide (MAG-OX) 400 MG tablet Take 400 mg by mouth 4 (four) times daily.  . Melatonin 300 MCG TABS Take 1 tablet by mouth at bedtime as needed.  . metolazone (ZAROXOLYN) 10 MG tablet Take 10 mg by mouth daily.  . metoprolol succinate (TOPROL-XL) 50 MG 24 hr tablet Take 1 tablet (50 mg total) by mouth daily. Take with or immediately following a meal.  . Multiple Vitamin (MULTIVITAMIN) tablet Take 1 tablet by mouth daily.  . Multiple Vitamins-Minerals (MULTIVITAMIN WITH MINERALS) tablet Take 1 tablet by mouth daily.  . OxyCODONE HCl, Abuse Deter, 5 MG TABA Take 1 tablet by mouth 3 (three) times daily.  . potassium chloride SA (K-DUR,KLOR-CON) 20 MEQ tablet Take 2 tablets 40 meq twice a day  . rOPINIRole (REQUIP) 1 MG tablet Take 1 mg by mouth 2 (two) times daily.  . sertraline (ZOLOFT) 100 MG tablet Take 100 mg by mouth daily.  Marland Kitchen sulfamethoxazole-trimethoprim (BACTRIM,SEPTRA) 400-80 MG tablet Take 1 tablet by mouth 2 (two) times daily.  . temazepam (RESTORIL) 30 MG capsule Take 30 mg by mouth at bedtime as needed for sleep.  . Turmeric 500 MG CAPS Take by mouth.  . [DISCONTINUED] lisinopril (PRINIVIL,ZESTRIL) 20 MG tablet Take 1 tablet (20 mg total) by mouth daily.  . [DISCONTINUED] metoprolol succinate (TOPROL-XL) 50 MG 24 hr tablet Take 50 mg by mouth daily. Take with or immediately following a meal.    Allergies  Allergen Reactions  . Bee Venom Hives    Takes benadryl  . Fentanyl Other (See Comments)  . Metformin Diarrhea  . Metformin And Related Diarrhea  . Metformin Hcl Diarrhea  . Other Other (See Comments)    Raw foods with seeds give her "boils".  Avoids raw strawberries, blueberries. Tolerates cooked fruits, tomato sauce, bread/grains with seeds. [facility] 10/17/13 Berries with seeds  . Alteplase Rash  . Cefepime Rash    Social History   Social History  . Marital status: Married    Spouse name: N/A  . Number of children: N/A  .  Years of education: N/A   Social History Main Topics  . Smoking status: Never Smoker  . Smokeless tobacco: Never Used  . Alcohol use No  . Drug use: No  . Sexual activity: Not Asked   Other Topics Concern  . None   Social History Narrative  . None    family history is not on file.  Wt Readings from Last 3 Encounters:  03/26/16 131.1 kg (289 lb)  02/06/16 133.4 kg (294 lb)  09/12/12 120.2 kg (265 lb)    PHYSICAL EXAM BP 103/66   Pulse 65   Ht _0  (1.778 m)   Wt 131.1 kg (289 lb)   BMI 41.47 kg/m  General appearance: alert, cooperative, appears stated age, Morbidly obese; she is pretty chronically ill-appearing, but in no acute distress. In a wheelchair that has been engineered for control. HEENT: [place]/AT, EOMI, MMM, anicteric sclera - poor dentition Neck:  nno carotid bruit and no notable JVD Lungs: clear to auscultation bilaterally, normal percussion bilaterally and non-labored Heart: regular rate and rhythm, S1& S2 distant but normal, no click, rub or gallop; roughly 2/6 SEM at RUSB. Abdomen: soft, non-tender; bowel sounds normal; no masses,  no organomegaly; unable to assess much of anything due to obesity. Extremities: extremities normal, atraumatic, no cyanosis, and edema difficult to assess because of her overall obesity, but appears to be at least 1-2+ Bilaterally Pulses: 2+ and symmetric radial pulses, but very difficult to palpate pedal pulses Skin: mobility and turgor normal - large bruise on L lower leg Neurologic: Mental status: Alert, oriented, thought content appropriate; very tearful about tremors    Adult ECG Report n/a  Other studies Reviewed: Additional studies/ records that were reviewed today include:  Recent Labs:   Lab Results  Component Value Date   CREATININE 1.36 (H) 03/24/2016   BUN 66 (H) 03/24/2016   NA 138 03/24/2016   K 3.8 03/24/2016   CL 99 03/24/2016   CO2 30 03/24/2016    ASSESSMENT / PLAN: Problem List Items Addressed This  Visit    Chronic atrial fibrillation ([place]): CHA2DS2-VASc Score 5; on Warfarin (Chronic)    Chronic persistent atrial fibrillation, rate controlled with beta blocker. I increased her dose last visit stopped amlodipine. I was happy to hear that she is actually still able to get her heart rate up with activities. This would indicate chronotropic competence.  No bleeding issues on warfarin - does have significant bruising. We can discontinue aspirin      Relevant Medications   lisinopril (PRINIVIL,ZESTRIL) 10 MG tablet   metoprolol succinate (TOPROL-XL) 50 MG 24 hr tablet   lisinopril (PRINIVIL,ZESTRIL) 10 MG tablet   Chronic diastolic heart failure (HCC) (Chronic)    Again I don't know that I can truly say she has 2 diastolic dysfunction because her EDP and cath was only 7, an echocardiogram was not able to determine.  She is on lisinopril 20 mg along with Toprol 50 mg. Since she has had hypotension with some orthostatic symptoms, I'm actually back down lisinopril to 10 mg daily. She is on high-dose of diuretic which is more edema. Slightly increased BUN and creatinine, I've asked that she pay more to her weight and make sure that is stable. I think that once she backs down on her significant fluid intake, probably start backing down diuretic.        Relevant Medications   lisinopril (PRINIVIL,ZESTRIL) 10 MG tablet   metoprolol succinate (TOPROL-XL) 50 MG 24 hr tablet   lisinopril (PRINIVIL,ZESTRIL) 10 MG tablet   Morbid obesity (HCC) (Chronic)    Vicious cycle/double and sore. She needs her knee operated on so she can heart exercising.  She needs to lose weight with diet modification and getting exercise. Consider gastric reduction surgery.      Relevant Medications   insulin detemir (LEVEMIR) 100 UNIT/ML injection   OSA (obstructive sleep apnea) (Chronic)    I tried to explain pulmonary hypertension to her. I think she probably has obesity hypoventilation syndrome as well as OSA. She  needs to be on CPAP. She says that she just has never had a cleaned. I admonished her to please wear it every night. Hopefully this will help some exertional dyspnea and edema.      Medication management (Chronic)    Slightly elevated BUN and creatinine on most recent check, she drinks plenty which I think we'll cut down which then allow [place] to  cut down her diuretic dosing. For now though I think he as is. Since she is having some hypotension however, we will back down her ACE inhibitor and this should help effusion.      Pre-operative cardiovascular examination    See detailed discussion last visit. Would be considered low risk patient for low risk surgery on her right knee. Would simply just watch for excess volume shifts.  No further cardiac evaluation is required  She probably has OHS and OSA and therefore will etiology would be concerned about her airway status and extubation.  Would be okay to stop warfarin for surgery, but will be discussed with her primary provider who is managing her levels.          Current medicines are reviewed at length with the patient today. (+/- concerns) n/a The following changes have been made: see below  Patient Instructions  Continue with current medication except decrease lisinopril to 10 mg   MEDICATION REFILL SENT TO MAIL ORDER - METOPROLOL, LISINOPRIL 10 MG.  DOSE TO START WAS SENT TO LOCAL PHARMACY PER PATIENT REQUEST OF LISINOPRIL    Try to only drink up to 2 liters of fluids a day.   Try to use your C-PAP NIGHTLY.    Echo test was good.  RECOMMEND  NEUROLOGIST - DR, TAT   Your physician wants you to follow-up in 4 months with Dr Ellyn Hack.  You will receive a reminder letter in the mail two months in advance. If you don't receive a letter, please call our office to schedule the follow-up appointment.    If you need a refill on your cardiac medications before your next appointment, please call your pharmacy.    Studies  Ordered:   No orders of the defined types were placed in this encounter.     Glenetta Hew, M.D., M.S. Interventional Cardiologist   Pager # [phone] Phone # [phone] [address] [place], [place] [address]

## 2016-03-26 NOTE — Patient Instructions (Addendum)
Continue with current medication except decrease lisinopril to 10 mg   MEDICATION REFILL SENT TO MAIL ORDER - METOPROLOL, LISINOPRIL 10 MG.  DOSE TO START WAS SENT TO LOCAL PHARMACY PER PATIENT REQUEST OF LISINOPRIL    Try to only drink up to 2 liters of fluids a day.   Try to use your C-PAP NIGHTLY.    Echo test was good.  RECOMMEND  NEUROLOGIST - DR, TAT   Your physician wants you to follow-up in 4 months with Dr Ellyn Hack.  You will receive a reminder letter in the mail two months in advance. If you don't receive a letter, please call our office to schedule the follow-up appointment.    If you need a refill on your cardiac medications before your next appointment, please call your pharmacy.

## 2016-03-28 ENCOUNTER — Encounter: Payer: Self-pay | Admitting: Cardiology

## 2016-03-28 NOTE — Assessment & Plan Note (Signed)
See detailed discussion last visit. Would be considered low risk patient for low risk surgery on her right knee. Would simply just watch for excess volume shifts.  No further cardiac evaluation is required  She probably has OHS and OSA and therefore will etiology would be concerned about her airway status and extubation.  Would be okay to stop warfarin for surgery, but will be discussed with her primary provider who is managing her levels.

## 2016-03-28 NOTE — Assessment & Plan Note (Addendum)
Chronic persistent atrial fibrillation, rate controlled with beta blocker. I increased her dose last visit stopped amlodipine. I was happy to hear that she is actually still able to get her heart rate up with activities. This would indicate chronotropic competence.  No bleeding issues on warfarin - does have significant bruising. We can discontinue aspirin

## 2016-03-28 NOTE — Assessment & Plan Note (Signed)
Vicious cycle/double and sore. She needs her knee operated on so she can heart exercising.  She needs to lose weight with diet modification and getting exercise. Consider gastric reduction surgery.

## 2016-03-28 NOTE — Assessment & Plan Note (Signed)
Slightly elevated BUN and creatinine on most recent check, she drinks plenty which I think we'll cut down which then allow [place] to cut down her diuretic dosing. For now though I think he as is. Since she is having some hypotension however, we will back down her ACE inhibitor and this should help effusion.

## 2016-03-28 NOTE — Assessment & Plan Note (Signed)
Again I don't know that I can truly say she has 2 diastolic dysfunction because her EDP and cath was only 7, an echocardiogram was not able to determine.  She is on lisinopril 20 mg along with Toprol 50 mg. Since she has had hypotension with some orthostatic symptoms, I'm actually back down lisinopril to 10 mg daily. She is on high-dose of diuretic which is more edema. Slightly increased BUN and creatinine, I've asked that she pay more to her weight and make sure that is stable. I think that once she backs down on her significant fluid intake, probably start backing down diuretic.

## 2016-03-28 NOTE — Assessment & Plan Note (Signed)
I tried to explain pulmonary hypertension to her. I think she probably has obesity hypoventilation syndrome as well as OSA. She needs to be on CPAP. She says that she just has never had a cleaned. I admonished her to please wear it every night. Hopefully this will help some exertional dyspnea and edema.

## 2016-04-02 DIAGNOSIS — H40033 Anatomical narrow angle, bilateral: Secondary | ICD-10-CM | POA: Diagnosis present

## 2016-04-04 NOTE — Progress Notes (Addendum)
Subjective:   Donna [name] was seen in consultation in the movement disorder clinic at the request of  Guadlupe Spanish, MD.  Pt accompanied by husband who supplements the history.    The evaluation is for tremor.  Tremor increased approximately 6 weeks ago but states that she was dx with ET 4-5 years ago by a neurologist at [facility] center.  She was told that fibromyalgia caused the tremor because the "pelvis isn't meeting right."  She states that tremor is across the shoulders, in the arms and when laying down, she will have it in the legs.  She states that when the arms are in use she will have tremor but when the legs are laying flat, she will have tremor.  She is on requip for restless leg syndrome and describes feeling quivery because of this and takes requip at 4pm and bedtime.  When asked when the tremor occurs, her husband states that he notes it for a few hours at each episode but not constant but patient states that it is always there and she can feel it.   There is a family hx of tremor in her mother, which was head tremor only.    Affected by caffeine:  Yes.    (1 cup coffee per day) Affected by alcohol:  Doesn't drink alcohol Affected by stress:  No. Affected by fatigue:  Yes.   Spills soup if on spoon:  May or may not Spills glass of liquid if full:  Cannot carry things because if tries to walk with something in hand will fall; husband states it is "mental" and "I think its fibro" Affects ADL's (tying shoes, brushing teeth, etc):  Yes.  , brushing of the teeth  Current/Previously tried tremor medications: on toprol 50  Current medications that may exacerbate tremor:  n/a  Allergies  Allergen Reactions  . Bee Venom Hives    Takes benadryl  . Fentanyl Other (See Comments)  . Metformin Diarrhea  . Metformin And Related Diarrhea  . Metformin Hcl Diarrhea  . Other Other (See Comments)    Raw foods with seeds give her "boils".  Avoids raw strawberries, blueberries.  Tolerates cooked fruits, tomato sauce, bread/grains with seeds. [facility] 10/17/13 Berries with seeds  . Alteplase Rash  . Cefepime Rash    Outpatient Encounter Prescriptions as of 04/08/2016  Medication Sig  . acetaminophen (TYLENOL) 500 MG tablet Take 500 mg by mouth every 6 (six) hours as needed.  . ALPRAZolam (XANAX) 0.25 MG tablet Take 0.25 mg by mouth 3 (three) times daily as needed for sleep.  Marland Kitchen aspirin 81 MG tablet Take 81 mg by mouth daily.  . busPIRone (BUSPAR) 5 MG tablet Take 10 mg by mouth 2 (two) times daily.   . Cholecalciferol (VITAMIN D) 400 UNITS capsule Take 800 Units by mouth daily.  Marland Kitchen desvenlafaxine (PRISTIQ) 100 MG 24 hr tablet Take 100 mg by mouth daily.  . Dulaglutide (TRULICITY ) Inject 2 mg into the skin once a week.  . ezetimibe (ZETIA) 10 MG tablet Take 10 mg by mouth daily.  . ferrous sulfate 325 (65 FE) MG tablet Take 325 mg by mouth 2 (two) times daily.  . furosemide (LASIX) 80 MG tablet Take 1 tablet (80 mg total) by mouth 2 (two) times daily. AND AS DIRECTED  . gabapentin (NEURONTIN) 600 MG tablet Take 600 mg by mouth 3 (three) times daily.  Marland Kitchen guaiFENesin (MUCINEX) 600 MG 12 hr tablet Take by mouth 2 (two) times daily.  Marland Kitchen  insulin detemir (LEVEMIR) 100 UNIT/ML injection Inject 42 Units into the skin at bedtime.  . insulin lispro (HUMALOG) 100 UNIT/ML injection Inject into the skin 3 (three) times daily before meals. Sliding scale  . Insulin Pen Needle (NOVOTWIST) 32G X 5 MM MISC by Does not apply route.  . lidocaine (LIDODERM) 5 % Place 1 patch onto the skin daily. Remove & Discard patch within 12 hours or as directed by MD  . lisinopril (PRINIVIL,ZESTRIL) 10 MG tablet Take 1 tablet (10 mg total) by mouth daily.  . magnesium oxide (MAG-OX) 400 MG tablet Take 400 mg by mouth 4 (four) times daily.  . Melatonin 300 MCG TABS Take 1 tablet by mouth at bedtime as needed.  . metolazone (ZAROXOLYN) 10 MG tablet Take 10 mg by mouth daily.  . metoprolol succinate  (TOPROL-XL) 50 MG 24 hr tablet Take 1 tablet (50 mg total) by mouth daily. Take with or immediately following a meal.  . Multiple Vitamin (MULTIVITAMIN) tablet Take 1 tablet by mouth daily.  . Multiple Vitamins-Minerals (MULTIVITAMIN WITH MINERALS) tablet Take 1 tablet by mouth daily.  . OxyCODONE HCl, Abuse Deter, 5 MG TABA Take 1 tablet by mouth 3 (three) times daily.  . potassium chloride SA (K-DUR,KLOR-CON) 20 MEQ tablet Take 2 tablets 40 meq twice a day  . rOPINIRole (REQUIP) 1 MG tablet Take 1 mg by mouth 2 (two) times daily.  . sertraline (ZOLOFT) 100 MG tablet Take 100 mg by mouth daily.  Marland Kitchen sulfamethoxazole-trimethoprim (BACTRIM,SEPTRA) 400-80 MG tablet Take 1 tablet by mouth 2 (two) times daily.  . temazepam (RESTORIL) 30 MG capsule Take 30 mg by mouth at bedtime as needed for sleep.  . Turmeric 500 MG CAPS Take by mouth.  . [DISCONTINUED] acetaminophen (TYLENOL) 500 MG chewable tablet Chew 500 mg by mouth every 6 (six) hours as needed for pain.  . [DISCONTINUED] lisinopril (PRINIVIL,ZESTRIL) 10 MG tablet Take 1 tablet (10 mg total) by mouth daily.   No facility-administered encounter medications on file as of 04/08/2016.     Past Medical History:  Diagnosis Date  . Chronic /permanent atrial fibrillation ([place]) 2005   On Warfarin (follwed @ [facility]); Rate controlled - On BB  . Chronic diastolic heart failure, NYHA class 2 ([place])    Updated by A. fib, hypertensive heart disease and obesity; EDP was only 7 by cardiac catheterization in September 2017  . Diabetes mellitus without complication ([place])   . Diabetic lumbosacral plexopathy ([place])   . Fibromyalgia   . Generalized anxiety disorder   . Incontinence   . Major depressive disorder   . Restless leg syndrome     Past Surgical History:  Procedure Laterality Date  . ABDOMINAL HYSTERECTOMY    . CARDIAC CATHETERIZATION  11/09/2015   [facility]: Nonocclusive CAD. EF 55%. EDP 7 mmHg.  Marland Kitchen CHOLECYSTECTOMY     . HIP SURGERY Right    x2  . KNEE ARTHROSCOPY Right   . MASS EXCISION     sarcoma - shoulder  . ROTATOR CUFF REPAIR Bilateral   . TONSILLECTOMY    . TRANSTHORACIC ECHOCARDIOGRAM  11/2013; 02/2016   a) [facility]: with Definity:  EF 60-65%. Mod LA dilation. Mild AoV sclerosis. Normal RHP. Indeterminate LV filling pressures. b) [facility]: Normal cavity size. EF 55-60%.no RWMA, mild AoV sclerosis (R cusp), Mod MAC. Mild biAtrial dilation.    Social History   Social History  . Marital status: Married    Spouse name: N/A  . Number  of children: N/A  . Years of education: N/A   Occupational History  . Not on file.   Social History Main Topics  . Smoking status: Never Smoker  . Smokeless tobacco: Never Used  . Alcohol use No  . Drug use: No  . Sexual activity: Not on file   Other Topics Concern  . Not on file   Social History Narrative  . No narrative on file    Family Status  Relation Status  . Mother Deceased  . Father Deceased   unknown  . Sister Alive  . Son Alive  . Son Alive    Review of Systems Some DOE.  Uses WC out of the home and some in the home.  Uses walker in the home except in the kitchen and uses WC there because "I am lazy."  No lateralizing weakness/paresthesia.  No swallowing issues.  No diplopia.  A complete 10 system ROS was obtained and was negative apart from what is mentioned.   Objective:   VITALS:   Vitals:   04/08/16 1319  BP: 92/62  Pulse: 66   Gen:  Appears stated age and in NAD. HEENT:  Normocephalic, atraumatic. The mucous membranes are moist. The superficial temporal arteries are without ropiness or tenderness. Cardiovascular: Regular rate and rhythm. Lungs: Clear to auscultation bilaterally.  Some dyspnea on exertion Neck: There are no carotid bruits noted bilaterally.  NEUROLOGICAL:  Orientation:  The patient is alert and oriented x 3.  Recent and remote memory are intact.  Attention span and concentration are normal.   Able to name objects and repeat without trouble.  Fund of knowledge is appropriate Cranial nerves: There is good facial symmetry. The pupils are equal round and reactive to light bilaterally. Fundoscopic exam reveals clear disc margins bilaterally. Extraocular muscles are intact and visual fields are full to confrontational testing. Speech is fluent and clear. Soft palate rises symmetrically and there is no tongue deviation. Hearing is intact to conversational tone. Tone: Tone is good throughout. Sensation: Sensation is intact to light touch and pinprick throughout (facial, trunk, extremities). Vibration is decreased at the bilateral big toe. There is no extinction with double simultaneous stimulation. There is no sensory dermatomal level identified. Coordination:  The patient has no dysdiadichokinesia or dysmetria. Motor: Strength is 5/5 in the bilateral upper and lower extremities.  Shoulder shrug is equal bilaterally.  There is no pronator drift.  There are no fasciculations noted. DTR's: Deep tendon reflexes are 2/4 at the bilateral biceps, triceps, brachioradialis, absent at the bilateral patella and achilles.  Plantar responses are downgoing bilaterally. Gait and Station: The patient is in a wheelchair.  She requires the assistance of her husband to get out of the chair.  She ambulates in a wide-based fashion and is stooped over at the hip and is very unbalanced.  She can only ambulate a few feet and is very short of breath.  MOVEMENT EXAM: Tremor:  There is no tremor in the UE, noted most significantly with action.  No tremor noted with intention.  The patient is  able to draw Archimedes spirals without significant difficulty.  There is no tremor at rest.  The patient is able to pour water from one glass to another without spilling it.  LABS  No results found for: TSH       Assessment/Plan:   1.  Tremor, by history  -Long with the patient today.  I actually saw no tremor at all today on  her examination.  She was able to approach the target without tremor.  She drew Archimedes spirals without any difficulty.  She poured a very full glass of water without spilling it.  Despite this, she stated that she had tremor today.  She is already on toprol and this dosage certainly cannot be changed given her low blood pressure.  I explained to her that even if I saw tremor, she would be limited on primidone because it interacts with Coumadin.  I told her that she could certainly talk to her primary care physician about this, but INR would need to be monitored and I certainly would want to visibly see that she had tremor before I would recommend starting this medication.  I told her that I would leave this discussion between her and her primary care provider.  2.  ? Augmentation from ropinirole  -I wonder if much of what she is not describing and experiencing is not augmentation from ropinirole which can give patient a feeling of inner tremor and restlessness and that feeling can move earlier and earlier in the day and is actually due to medication.  I certainly was not going to change her medication, given that I don't prescribe it and I am not 100% sure that this is the cause, but it may better explain the fact that she actually had no tremor.  I did tell her that the only way to explore whether this is the cause would be to change the ropinirole to something else.  We talked extensively about the relaxes system, which turned out she already has at home but is not using.  I would strongly encouraged her to use this, given the fact that one would not have to add more medication to her regimen.  I have found benefit with this, without continually adding more medicine.  3.  She does not need to follow-up with me, given that I did not see a primary movement disorder issue and can continue to follow-up with her primary care physician.  I will try to get a copy of her lab work, which was not available today.   Much greater than 50% of this visit was spent in counseling and coordinating care.  Total face to face time:  45 min  Addendum:  Lab work received from primary care provider.  Blood work dated 01/22/2016.  Magnesium was low at 1.4.  Vitamin D was low at 29.3.  Sodium was 142, potassium 3.8, chloride 96, CO2 28, BUN 17, creatinine 0.68, glucose 148, AST 17, ALT 13, alkaline phosphatase 86, hemoglobin A1c 7.2, total cholesterol 142, HDL 46, LDL 76, TSH 1.440, white blood cells 7.8, hemoglobin 13.4, hematocrit 43.0, platelets 193.  CC:  Guadlupe Spanish, MD

## 2016-04-08 ENCOUNTER — Encounter: Payer: Self-pay | Admitting: Neurology

## 2016-04-08 ENCOUNTER — Ambulatory Visit (INDEPENDENT_AMBULATORY_CARE_PROVIDER_SITE_OTHER): Payer: Medicare Other | Admitting: Neurology

## 2016-04-08 VITALS — BP 92/62 | HR 66

## 2016-04-08 DIAGNOSIS — R251 Tremor, unspecified: Secondary | ICD-10-CM

## 2016-04-08 DIAGNOSIS — G2581 Restless legs syndrome: Secondary | ICD-10-CM

## 2016-04-08 NOTE — Progress Notes (Signed)
Note sent

## 2016-04-09 DIAGNOSIS — M1711 Unilateral primary osteoarthritis, right knee: Secondary | ICD-10-CM | POA: Diagnosis not present

## 2016-04-09 DIAGNOSIS — M898X6 Other specified disorders of bone, lower leg: Secondary | ICD-10-CM | POA: Diagnosis not present

## 2016-04-09 DIAGNOSIS — M25551 Pain in right hip: Secondary | ICD-10-CM | POA: Diagnosis not present

## 2016-04-09 DIAGNOSIS — M1612 Unilateral primary osteoarthritis, left hip: Secondary | ICD-10-CM | POA: Diagnosis not present

## 2016-04-09 DIAGNOSIS — Z471 Aftercare following joint replacement surgery: Secondary | ICD-10-CM | POA: Diagnosis not present

## 2016-04-09 DIAGNOSIS — R7889 Finding of other specified substances, not normally found in blood: Secondary | ICD-10-CM | POA: Diagnosis not present

## 2016-04-09 DIAGNOSIS — R799 Abnormal finding of blood chemistry, unspecified: Secondary | ICD-10-CM | POA: Diagnosis not present

## 2016-04-09 DIAGNOSIS — M533 Sacrococcygeal disorders, not elsewhere classified: Secondary | ICD-10-CM | POA: Diagnosis not present

## 2016-04-09 DIAGNOSIS — Z96641 Presence of right artificial hip joint: Secondary | ICD-10-CM | POA: Diagnosis not present

## 2016-04-10 ENCOUNTER — Telehealth: Payer: Self-pay | Admitting: Neurology

## 2016-04-14 ENCOUNTER — Telehealth: Payer: Self-pay | Admitting: Neurology

## 2016-04-14 NOTE — Telephone Encounter (Signed)
Only thing mentioned in the notes was the relaxis system - did you discuss other RLS drugs with patient?

## 2016-04-14 NOTE — Telephone Encounter (Signed)
Donna [name] Feb 14, 2051. She has been on Requip too long and suggested another drug but cannot remember the name of the drug. She is going to see her PA this week and wants to make sure she has it. Her # is [phone]. Thank you

## 2016-04-14 NOTE — Telephone Encounter (Signed)
I do recommend relaxis and that she try that.  Several other things can be tried in place of requip (mirapex, etc) but mirapex can cause augmentation as well which is why would use the relaxis first

## 2016-04-15 NOTE — Telephone Encounter (Signed)
Patient made aware.

## 2016-04-17 DIAGNOSIS — Z6841 Body Mass Index (BMI) 40.0 and over, adult: Secondary | ICD-10-CM | POA: Diagnosis not present

## 2016-04-17 DIAGNOSIS — E782 Mixed hyperlipidemia: Secondary | ICD-10-CM | POA: Diagnosis not present

## 2016-04-17 DIAGNOSIS — G2581 Restless legs syndrome: Secondary | ICD-10-CM | POA: Diagnosis not present

## 2016-04-17 DIAGNOSIS — M545 Low back pain: Secondary | ICD-10-CM | POA: Diagnosis not present

## 2016-04-17 DIAGNOSIS — Z79899 Other long term (current) drug therapy: Secondary | ICD-10-CM | POA: Diagnosis not present

## 2016-04-17 DIAGNOSIS — E1065 Type 1 diabetes mellitus with hyperglycemia: Secondary | ICD-10-CM | POA: Diagnosis not present

## 2016-04-17 DIAGNOSIS — E041 Nontoxic single thyroid nodule: Secondary | ICD-10-CM | POA: Diagnosis not present

## 2016-04-17 DIAGNOSIS — N390 Urinary tract infection, site not specified: Secondary | ICD-10-CM | POA: Diagnosis not present

## 2016-04-17 DIAGNOSIS — E559 Vitamin D deficiency, unspecified: Secondary | ICD-10-CM | POA: Diagnosis not present

## 2016-04-17 DIAGNOSIS — Z7901 Long term (current) use of anticoagulants: Secondary | ICD-10-CM | POA: Diagnosis not present

## 2016-04-21 NOTE — Telephone Encounter (Signed)
error 

## 2016-04-25 DIAGNOSIS — E119 Type 2 diabetes mellitus without complications: Secondary | ICD-10-CM | POA: Diagnosis not present

## 2016-04-25 DIAGNOSIS — M25551 Pain in right hip: Secondary | ICD-10-CM | POA: Diagnosis not present

## 2016-05-15 DIAGNOSIS — G2581 Restless legs syndrome: Secondary | ICD-10-CM | POA: Diagnosis not present

## 2016-05-15 DIAGNOSIS — Z7901 Long term (current) use of anticoagulants: Secondary | ICD-10-CM | POA: Diagnosis not present

## 2016-05-15 DIAGNOSIS — E1065 Type 1 diabetes mellitus with hyperglycemia: Secondary | ICD-10-CM | POA: Diagnosis not present

## 2016-05-15 DIAGNOSIS — Z6841 Body Mass Index (BMI) 40.0 and over, adult: Secondary | ICD-10-CM | POA: Diagnosis not present

## 2016-05-15 DIAGNOSIS — M545 Low back pain: Secondary | ICD-10-CM | POA: Diagnosis not present

## 2016-05-15 DIAGNOSIS — N289 Disorder of kidney and ureter, unspecified: Secondary | ICD-10-CM | POA: Diagnosis not present

## 2016-06-04 DIAGNOSIS — F330 Major depressive disorder, recurrent, mild: Secondary | ICD-10-CM | POA: Diagnosis not present

## 2016-06-04 DIAGNOSIS — F411 Generalized anxiety disorder: Secondary | ICD-10-CM | POA: Diagnosis not present

## 2016-06-17 DIAGNOSIS — Z7901 Long term (current) use of anticoagulants: Secondary | ICD-10-CM | POA: Diagnosis not present

## 2016-06-17 DIAGNOSIS — M545 Low back pain: Secondary | ICD-10-CM | POA: Diagnosis not present

## 2016-06-17 DIAGNOSIS — M25561 Pain in right knee: Secondary | ICD-10-CM | POA: Diagnosis not present

## 2016-06-17 DIAGNOSIS — E1065 Type 1 diabetes mellitus with hyperglycemia: Secondary | ICD-10-CM | POA: Diagnosis not present

## 2016-06-17 DIAGNOSIS — Z6841 Body Mass Index (BMI) 40.0 and over, adult: Secondary | ICD-10-CM | POA: Diagnosis not present

## 2016-06-27 DIAGNOSIS — M1711 Unilateral primary osteoarthritis, right knee: Secondary | ICD-10-CM | POA: Diagnosis not present

## 2016-06-27 DIAGNOSIS — E782 Mixed hyperlipidemia: Secondary | ICD-10-CM | POA: Diagnosis not present

## 2016-06-27 DIAGNOSIS — Z1231 Encounter for screening mammogram for malignant neoplasm of breast: Secondary | ICD-10-CM | POA: Diagnosis not present

## 2016-06-27 DIAGNOSIS — Z23 Encounter for immunization: Secondary | ICD-10-CM | POA: Diagnosis not present

## 2016-06-27 DIAGNOSIS — Z136 Encounter for screening for cardiovascular disorders: Secondary | ICD-10-CM | POA: Diagnosis not present

## 2016-06-27 DIAGNOSIS — N959 Unspecified menopausal and perimenopausal disorder: Secondary | ICD-10-CM | POA: Diagnosis not present

## 2016-06-27 DIAGNOSIS — Z9181 History of falling: Secondary | ICD-10-CM | POA: Diagnosis not present

## 2016-06-27 DIAGNOSIS — Z6839 Body mass index (BMI) 39.0-39.9, adult: Secondary | ICD-10-CM | POA: Diagnosis not present

## 2016-06-27 DIAGNOSIS — Z0000 Encounter for general adult medical examination without abnormal findings: Secondary | ICD-10-CM | POA: Diagnosis not present

## 2016-06-27 DIAGNOSIS — E669 Obesity, unspecified: Secondary | ICD-10-CM | POA: Diagnosis not present

## 2016-07-11 DIAGNOSIS — R32 Unspecified urinary incontinence: Secondary | ICD-10-CM | POA: Diagnosis not present

## 2016-07-11 DIAGNOSIS — Z96641 Presence of right artificial hip joint: Secondary | ICD-10-CM | POA: Diagnosis not present

## 2016-07-11 DIAGNOSIS — I482 Chronic atrial fibrillation: Secondary | ICD-10-CM | POA: Diagnosis not present

## 2016-07-11 DIAGNOSIS — Z8669 Personal history of other diseases of the nervous system and sense organs: Secondary | ICD-10-CM | POA: Diagnosis not present

## 2016-07-11 DIAGNOSIS — I5032 Chronic diastolic (congestive) heart failure: Secondary | ICD-10-CM | POA: Diagnosis not present

## 2016-07-11 DIAGNOSIS — E119 Type 2 diabetes mellitus without complications: Secondary | ICD-10-CM | POA: Diagnosis not present

## 2016-07-11 DIAGNOSIS — G92 Toxic encephalopathy: Secondary | ICD-10-CM | POA: Diagnosis not present

## 2016-07-11 DIAGNOSIS — I10 Essential (primary) hypertension: Secondary | ICD-10-CM | POA: Diagnosis not present

## 2016-07-11 DIAGNOSIS — M170 Bilateral primary osteoarthritis of knee: Secondary | ICD-10-CM | POA: Diagnosis not present

## 2016-07-11 DIAGNOSIS — M1711 Unilateral primary osteoarthritis, right knee: Secondary | ICD-10-CM | POA: Diagnosis not present

## 2016-07-11 DIAGNOSIS — G4733 Obstructive sleep apnea (adult) (pediatric): Secondary | ICD-10-CM | POA: Diagnosis not present

## 2016-07-11 DIAGNOSIS — G2581 Restless legs syndrome: Secondary | ICD-10-CM | POA: Diagnosis not present

## 2016-07-15 DIAGNOSIS — Z7901 Long term (current) use of anticoagulants: Secondary | ICD-10-CM

## 2016-07-17 DIAGNOSIS — E1065 Type 1 diabetes mellitus with hyperglycemia: Secondary | ICD-10-CM | POA: Diagnosis not present

## 2016-07-17 DIAGNOSIS — Z6841 Body Mass Index (BMI) 40.0 and over, adult: Secondary | ICD-10-CM | POA: Diagnosis not present

## 2016-07-17 DIAGNOSIS — E782 Mixed hyperlipidemia: Secondary | ICD-10-CM | POA: Diagnosis not present

## 2016-07-17 DIAGNOSIS — Z7901 Long term (current) use of anticoagulants: Secondary | ICD-10-CM | POA: Diagnosis not present

## 2016-07-17 DIAGNOSIS — M545 Low back pain: Secondary | ICD-10-CM | POA: Diagnosis not present

## 2016-07-17 DIAGNOSIS — Z79899 Other long term (current) drug therapy: Secondary | ICD-10-CM | POA: Diagnosis not present

## 2016-07-17 DIAGNOSIS — E559 Vitamin D deficiency, unspecified: Secondary | ICD-10-CM | POA: Diagnosis not present

## 2016-07-22 DIAGNOSIS — M25561 Pain in right knee: Secondary | ICD-10-CM | POA: Diagnosis not present

## 2016-07-25 ENCOUNTER — Encounter: Payer: Self-pay | Admitting: Cardiology

## 2016-07-25 ENCOUNTER — Ambulatory Visit (INDEPENDENT_AMBULATORY_CARE_PROVIDER_SITE_OTHER): Payer: Medicare Other | Admitting: Cardiology

## 2016-07-25 VITALS — BP 93/64 | HR 78 | Ht 71.0 in | Wt 279.4 lb

## 2016-07-25 DIAGNOSIS — I5032 Chronic diastolic (congestive) heart failure: Secondary | ICD-10-CM | POA: Diagnosis not present

## 2016-07-25 DIAGNOSIS — I10 Essential (primary) hypertension: Secondary | ICD-10-CM | POA: Diagnosis not present

## 2016-07-25 DIAGNOSIS — Z01810 Encounter for preprocedural cardiovascular examination: Secondary | ICD-10-CM

## 2016-07-25 DIAGNOSIS — I878 Other specified disorders of veins: Secondary | ICD-10-CM | POA: Diagnosis not present

## 2016-07-25 DIAGNOSIS — I482 Chronic atrial fibrillation, unspecified: Secondary | ICD-10-CM

## 2016-07-25 MED ORDER — LISINOPRIL 2.5 MG PO TABS: 2.5000 mg | ORAL_TABLET | Freq: Every day | ORAL | 11 refills | Status: DC

## 2016-07-25 NOTE — Progress Notes (Signed)
PCP: Guadlupe Spanish, MD  Clinic Note: Chief Complaint  Patient presents with  . Follow-up    pt states some dizziness and light headed   . Edema    both legs and ankles     HPI: Donna [name] is a 66 y.o. female with a PMH below who presents today for 4 month f/u - HFPF; chronic LE Edema Donna [name] is a 66 y.o. female who is being seen today for the follow-up Afib & HFPF/Edema with Pre-op Assessment at the request of Guadlupe Spanish, MD - as well as Dr. Jacqulynn Cadet from [place] is now seeing her third of not fourth cardiologist. She has carried a diagnosis of congestive heart failure, however she has had essentially normal echocardiographic evaluation and also recent cardiac cath that was negative for any ischemia. She basically has OSA and likely obesity hypoventilation syndrome along with chronic lower extremity edema that I think is probably more related to venous stasis and potentially right-sided failure however the echocardiogram did not suggest it.  Just to reassess her EF and right-sided pressures we did recheck an echocardiogram.  I also had her start taking her 80 mg furosemide twice a day with metolazone prior to her first dose. We talked by sliding scale.  Donna [name] was last seen on 03/26/2016 - she was actually doing better at that time. No sign of chronotropic incompetence from her chronic A. fib. It would appear that her edema is probably more related to venous stasis and chronic diastolic heart failure. On stable regimen. We did back down on her diuretic regimen to using the metolazone when necessary. We also back down on her ACE inhibitor due to hypotension. Despite having edema, we want her to hydrate but limited to 2 L per day. Use CPAP.  Recent Hospitalizations: n/a  Seen by Dr. Jacqulynn Cadet from [place] - pending R knee Sgx - needs to loose wgt, started on low carb diet.  Studies Personally Reviewed - (if available, images/films reviewed:  From Epic Chart or Care Everywhere)  2-D echo from December 2017: EF 55-60%. No regional wall motion abnormality. Possible mild AS versus sclerosis.  Interval History: Donna [name] presents today actually with a smile on her face. She still in a wheelchair but is happy about the prospect of potential knee surgery. She is excited that she has lost weight. She is also happy to note that her A1c has improved. She was having some hip pain back in February and had an aspiration done and that went well. As far as cardiac standpoint to go, she says that her edema is pretty stable on leaving taking when necessary Zaroxolyn. Otherwise she is continue current dose of furosemide. She does note some dizziness orthostatic and not orthostatic. But if she goes to stand up she will get very dizzy. She really denies any rapid irregular heartbeat or palpitation sensation suggested she know she is in A. fib. Note real chest pain or pressure with rest or exertion, albeit that she does not exert herself very much. She has occasional muscle skeletal discomfort under her left breast but that does not seem to be associated with any particular activity and is random.   No syncope/near syncope, or TIA/amaurosis fugax symptoms. No melena, hematochezia, hematuria, or epstaxis. No claudication - walking is limited by her knee pain and hip pain.  ROS: A comprehensive was performed. Review of Systems  Constitutional: Negative for malaise/fatigue (Energy improving. Is looking forward to  trying to walk once her knee is fixed).  HENT: Negative for congestion and nosebleeds.   Respiratory: Negative for cough and wheezing.   Cardiovascular: Positive for leg swelling (Notably improved).  Gastrointestinal: Negative for heartburn.  Musculoskeletal: Positive for joint pain (Right knee and both hips) and myalgias (Rare cramps).  Neurological: Positive for dizziness. Negative for focal weakness.  Endo/Heme/Allergies: Negative for  environmental allergies. Does not bruise/bleed easily.  Psychiatric/Behavioral: Negative for depression (Seems to be in good spirits) and memory loss. The patient is not nervous/anxious and does not have insomnia.    I have reviewed and (if needed) personally updated the patient's problem list, medications, allergies, past medical and surgical history, social and family history.   Past Medical History:  Diagnosis Date  . Chronic /permanent atrial fibrillation ([place]) 2005   On Warfarin (follwed @ [facility]); Rate controlled - On BB  . Chronic diastolic heart failure, NYHA class 2 ([place])    Updated by A. fib, hypertensive heart disease and obesity; EDP was only 7 by cardiac catheterization in September 2017  . Diabetes mellitus without complication ([place])   . Diabetic lumbosacral plexopathy ([place])   . Fibromyalgia   . Generalized anxiety disorder   . Incontinence   . Major depressive disorder   . Restless leg syndrome     Past Surgical History:  Procedure Laterality Date  . ABDOMINAL HYSTERECTOMY    . CARDIAC CATHETERIZATION  11/09/2015   [facility]: Nonocclusive CAD. EF 55%. EDP 7 mmHg.  Marland Kitchen CHOLECYSTECTOMY    . HIP SURGERY Right    x2  . KNEE ARTHROSCOPY Right   . MASS EXCISION     sarcoma - shoulder  . ROTATOR CUFF REPAIR Bilateral   . TONSILLECTOMY    . TRANSTHORACIC ECHOCARDIOGRAM  11/2013; 02/2016   a) [facility]: with Def[place]:  EF 60-65%. Mod LA dilation. Mild AoV sclerosis. Normal RHP. Indeterminate LV filling pressures. b) [facility]: Normal cavity size. EF 55-60%.no RWMA, mild AoV sclerosis (R cusp), Mod MAC. Mild biAtrial dilation.   Chest CTA September 2015: Cardiomegaly noted with pulmonary edema and small bilateral effusions noted. Aortic calcification noted suggesting of aortic atherosclerosis.   Current Meds  Medication Sig  . acetaminophen (TYLENOL) 500 MG tablet Take 500 mg by mouth every 6 (six) hours as needed.  . ALPRAZolam  (XANAX) 0.25 MG tablet Take 0.25 mg by mouth 3 (three) times daily as needed for sleep.  Marland Kitchen aspirin 81 MG tablet Take 81 mg by mouth daily.  . busPIRone (BUSPAR) 5 MG tablet Take 10 mg by mouth 2 (two) times daily.   . Cholecalciferol (VITAMIN D) 400 UNITS capsule Take 800 Units by mouth daily.  Marland Kitchen desvenlafaxine (PRISTIQ) 100 MG 24 hr tablet Take 100 mg by mouth daily.  . Dulaglutide (TRULICITY [place]) Inject 2 mg into the skin once a week.  . ezetimibe (ZETIA) 10 MG tablet Take 10 mg by mouth daily.  . ferrous sulfate 325 (65 FE) MG tablet Take 325 mg by mouth 2 (two) times daily.  . furosemide (LASIX) 80 MG tablet Take 80 mg by mouth 2 (two) times daily.  Marland Kitchen gabapentin (NEURONTIN) 600 MG tablet Take 600 mg by mouth 3 (three) times daily.  Marland Kitchen guaiFENesin (MUCINEX) 600 MG 12 hr tablet Take by mouth 2 (two) times daily.  . insulin detemir (LEVEMIR) 100 UNIT/ML injection Inject 42 Units into the skin at bedtime.  . insulin lispro (HUMALOG) 100 UNIT/ML injection Inject into the skin 3 (three)  times daily before meals. Sliding scale  . Insulin Pen Needle (NOVOTWIST) 32G X 5 MM MISC by Does not apply route.  . lidocaine (LIDODERM) 5 % Place 1 patch onto the skin daily. Remove & Discard patch within 12 hours or as directed by MD  . magnesium oxide (MAG-OX) 400 MG tablet Take 400 mg by mouth 4 (four) times daily.  . Melatonin 300 MCG TABS Take 1 tablet by mouth at bedtime as needed.  . metolazone (ZAROXOLYN) 10 MG tablet Take 10 mg by mouth daily.  . metoprolol succinate (TOPROL-XL) 50 MG 24 hr tablet Take 1 tablet (50 mg total) by mouth daily. Take with or immediately following a meal.  . Multiple Vitamin (MULTIVITAMIN) tablet Take 1 tablet by mouth daily.  . Multiple Vitamins-Minerals (MULTIVITAMIN WITH MINERALS) tablet Take 1 tablet by mouth daily.  . OxyCODONE HCl, Abuse Deter, 5 MG TABA Take 1 tablet by mouth 3 (three) times daily.  . potassium chloride SA (K-DUR,KLOR-CON) 20 MEQ tablet Take 2 tablets  40 meq twice a day  . pramipexole (MIRAPEX) 0.125 MG tablet Take 0.125 mg by mouth daily.  . sertraline (ZOLOFT) 100 MG tablet Take 100 mg by mouth daily.  Marland Kitchen sulfamethoxazole-trimethoprim (BACTRIM,SEPTRA) 400-80 MG tablet Take 1 tablet by mouth 2 (two) times daily.  . temazepam (RESTORIL) 30 MG capsule Take 30 mg by mouth at bedtime as needed for sleep.  . Turmeric 500 MG CAPS Take by mouth.  . warfarin (COUMADIN) 10 MG tablet Take 10 mg by mouth. Monday Wednesday Friday Sunday  . warfarin (COUMADIN) 7.5 MG tablet Take 7.5 mg by mouth. Tuesday, Thursday, Saturday    Allergies  Allergen Reactions  . Bee Venom Hives    Takes benadryl  . Fentanyl Other (See Comments)  . Metformin Diarrhea  . Metformin And Related Diarrhea  . Metformin Hcl Diarrhea  . Other Other (See Comments)    Raw foods with seeds give her "boils".  Avoids raw strawberries, blueberries. Tolerates cooked fruits, tomato sauce, bread/grains with seeds. [facility] 10/17/13 Berries with seeds  . Alteplase Rash  . Cefepime Rash    Social History   Social History  . Marital status: Married    Spouse name: N/A  . Number of children: N/A  . Years of education: N/A   Social History Main Topics  . Smoking status: Never Smoker  . Smokeless tobacco: Never Used  . Alcohol use No  . Drug use: No  . Sexual activity: Not Asked   Other Topics Concern  . None   Social History Narrative  . None    family history includes Heart disease in her mother; Hypertension in her mother; Leukemia in her son.  Wt Readings from Last 3 Encounters:  07/25/16 279 lb 6.4 oz (126.7 kg)  03/26/16 289 lb (131.1 kg)  02/06/16 294 lb (133.4 kg)  -- trying to loose weight - for knee surgery.  PHYSICAL EXAM BP 93/64   Pulse 78   Ht _0  (1.803 m)   Wt 279 lb 6.4 oz (126.7 kg)   SpO2 93%   BMI 38.97 kg/m  General appearance: alert, cooperative, appears stated age, no distress. Moderately obese by BMI, but with her risk factors would be  morbidly obese.  HEENT: /AT, EOMI, MMM, anicteric sclera; poor dentition  Neck: no adenopathy, no carotid bruit and no JVD Lungs: clear to auscultation bilaterally, normal percussion bilaterally and non-labored Heart:Irregularly irregular rhythm with regular rate. Distant S1 and S2  but normal. No rubs  or gallops but there is a roughly 2/6 SEM at RUSB is stable.  Abdomen: soft, non-tender; bowel sounds normal; no masses,  no organomegaly; significant truncal obesity, but notably improved.  Extremities: extremities normal, atraumatic, no cyanosis, and edema - trivial with venous stasis changes still present.  Pulses: 2+ and symmetric; Neurologic: Mental status: Alert & oriented x 3, thought content appropriate; non-focal exam.  Pleasant mood & affect.    Adult ECG Report n/a  Other studies Reviewed: Additional studies/ records that were reviewed today include:  Recent Labs: 07/12/2016, from PCP  Na+ - , K+ 3.9, Cl- 102, HCO3- 27 , BUN 19, Cr 0.83, Glu 94, Ca2+  9.2; AST 19, ALT, 19, AlkP 84  CBC: W 7.2, Hgb 12.5; A1c 7.4  TC 134, TG 70, HDL 53, LDL 67   ASSESSMENT / PLAN: Problem List Items Addressed This Visit    Chronic atrial fibrillation ([place]): CHA2DS2-VASc Score 5; on Warfarin (Chronic)    Persistent atrial fibrillation and I have not seen her out of A. fib. Rate controlled with beta blocker that was increased to 50 mg Toprol. On warfarin, managed by PCP. She does have some bruising so we stopped her aspirin and this has improved notably. INR was therapeutic at 2 last check.  - She will need to have her warfarin held 5-7 days prior to her surgery. This should be something managed by her PCP who is following her INR levels.      Relevant Medications   furosemide (LASIX) 80 MG tablet   lisinopril (PRINIVIL,ZESTRIL) 2.5 MG tablet   Chronic diastolic heart failure (HCC) - Primary (Chronic)    She has borderline grade 1 to grade 2 diastolic dysfunction but does not seem to be  having heart failure symptoms that she has no PND or orthopnea to go to the edema. I suspect the edema is more related to venous insufficiency. Back down on her ACE inhibitor now because of hypotension. We'll continue Toprol. Continue current dose of Lasix with when necessary Zaroxolyn. He seems very euvolemic at this point. Only using Zaroxolyn once a week or so. -Stable BUN/creatinine on last little asthma suggest that she is euvolemic.      Relevant Medications   furosemide (LASIX) 80 MG tablet   lisinopril (PRINIVIL,ZESTRIL) 2.5 MG tablet   Essential hypertension (Chronic)    Her blood pressure is actually low today. She is on Toprol 50 and I'm going to reduce her lisinopril down to 2.5 mg.  -> I suspect that once really get her volume levels down, her blood pressure has responded properly.      Relevant Medications   furosemide (LASIX) 80 MG tablet   lisinopril (PRINIVIL,ZESTRIL) 2.5 MG tablet   Morbid obesity (HCC) (Chronic)    She is done very well with weight loss. I continue to encourage her efforts. She is definitely monitoring his diet. Hopefully she can get her knee surgery done and she can get back in exercising.      Pre-operative cardiovascular examination    Again I have done a detailed discussion in the past. She continues to be a low risk patient for low risk surgery on her right knee. Avoid excess volume shifts. With relatively normal echo recently and reported cardiac catheterization with nonocclusive CAD, she would not require any further cardiac evaluation for her surgery. Knee surgery would be considered low risk procedure from a cardiac standpoint. Her major risk would be related to her weight - and she is doing a good  job of losing weight. She is on stable dose of metoprolol which mitigates cardiac risk. Would be okay to stop warfarin for surgery but this is being managed by PCP.  Full detailed discussion from my initial note.       Venous stasis of both lower  extremities (Chronic)    Continue to recommend support stockings. Edema seems well controlled with current diuretic dosing. We may be held back off on the furosemide in the future, but for now would continue where it is.      Relevant Medications   furosemide (LASIX) 80 MG tablet   lisinopril (PRINIVIL,ZESTRIL) 2.5 MG tablet      Current medicines are reviewed at length with the patient today. (+/- concerns) Dizziness The following changes have been made: Reducing lisinopril dose  Patient Instructions  MEDICATION CHANGE  STOP LISINOPRIL FOR 2 DAYS THEN RESTART WITH  NEW DOSE OF 2.5 MG ONE TABLET DAILY.     Your physician wants you to follow-up in [place] will receive a reminder letter in the mail two months in advance. If you don't receive a letter, please call our office to schedule the follow-up appointment.    If you need a refill on your cardiac medications before your next appointment, please call your pharmacy.    Studies Ordered:   No orders of the defined types were placed in this encounter.     Glenetta Hew, M.D., M.S. Interventional Cardiologist   Pager # [phone] Phone # [phone] [address] [place], [place] [address]

## 2016-07-25 NOTE — Patient Instructions (Signed)
MEDICATION CHANGE  STOP LISINOPRIL FOR 2 DAYS THEN RESTART WITH  NEW DOSE OF 2.5 MG ONE TABLET DAILY.     Your physician wants you to follow-up in [place] will receive a reminder letter in the mail two months in advance. If you don't receive a letter, please call our office to schedule the follow-up appointment.    If you need a refill on your cardiac medications before your next appointment, please call your pharmacy.

## 2016-07-27 ENCOUNTER — Encounter: Payer: Self-pay | Admitting: Cardiology

## 2016-07-27 NOTE — Assessment & Plan Note (Signed)
Continue to recommend support stockings. Edema seems well controlled with current diuretic dosing. We may be held back off on the furosemide in the future, but for now would continue where it is.

## 2016-07-27 NOTE — Assessment & Plan Note (Signed)
She is done very well with weight loss. I continue to encourage her efforts. She is definitely monitoring his diet. Hopefully she can get her knee surgery done and she can get back in exercising.

## 2016-07-27 NOTE — Assessment & Plan Note (Signed)
Persistent atrial fibrillation and I have not seen her out of A. fib. Rate controlled with beta blocker that was increased to 50 mg Toprol. On warfarin, managed by PCP. She does have some bruising so we stopped her aspirin and this has improved notably. INR was therapeutic at 2 last check.  - She will need to have her warfarin held 5-7 days prior to her surgery. This should be something managed by her PCP who is following her INR levels.

## 2016-07-27 NOTE — Assessment & Plan Note (Signed)
She has borderline grade 1 to grade 2 diastolic dysfunction but does not seem to be having heart failure symptoms that she has no PND or orthopnea to go to the edema. I suspect the edema is more related to venous insufficiency. Back down on her ACE inhibitor now because of hypotension. We'll continue Toprol. Continue current dose of Lasix with when necessary Zaroxolyn. He seems very euvolemic at this point. Only using Zaroxolyn once a week or so. -Stable BUN/creatinine on last little asthma suggest that she is euvolemic.

## 2016-07-27 NOTE — Assessment & Plan Note (Addendum)
Again I have done a detailed discussion in the past. She continues to be a low risk patient for low risk surgery on her right knee. Avoid excess volume shifts. With relatively normal echo recently and reported cardiac catheterization with nonocclusive CAD, she would not require any further cardiac evaluation for her surgery. Knee surgery would be considered low risk procedure from a cardiac standpoint. Her major risk would be related to her weight - and she is doing a good job of losing weight. She is on stable dose of metoprolol which mitigates cardiac risk. Would be okay to stop warfarin for surgery but this is being managed by PCP.  Full detailed discussion from my initial note.

## 2016-07-27 NOTE — Assessment & Plan Note (Signed)
Her blood pressure is actually low today. She is on Toprol 50 and I'm going to reduce her lisinopril down to 2.5 mg.  -> I suspect that once really get her volume levels down, her blood pressure has responded properly.

## 2016-07-29 DIAGNOSIS — M25561 Pain in right knee: Secondary | ICD-10-CM | POA: Diagnosis not present

## 2016-07-29 DIAGNOSIS — M25562 Pain in left knee: Secondary | ICD-10-CM | POA: Diagnosis not present

## 2016-07-29 DIAGNOSIS — M1711 Unilateral primary osteoarthritis, right knee: Secondary | ICD-10-CM | POA: Diagnosis not present

## 2016-07-31 DIAGNOSIS — M6281 Muscle weakness (generalized): Secondary | ICD-10-CM | POA: Diagnosis not present

## 2016-07-31 DIAGNOSIS — G8929 Other chronic pain: Secondary | ICD-10-CM | POA: Diagnosis not present

## 2016-07-31 DIAGNOSIS — M25562 Pain in left knee: Secondary | ICD-10-CM | POA: Diagnosis not present

## 2016-07-31 DIAGNOSIS — M25561 Pain in right knee: Secondary | ICD-10-CM | POA: Diagnosis not present

## 2016-08-20 DIAGNOSIS — I739 Peripheral vascular disease, unspecified: Secondary | ICD-10-CM | POA: Diagnosis not present

## 2016-08-20 DIAGNOSIS — M79606 Pain in leg, unspecified: Secondary | ICD-10-CM | POA: Diagnosis not present

## 2016-08-28 DIAGNOSIS — G8929 Other chronic pain: Secondary | ICD-10-CM | POA: Diagnosis not present

## 2016-08-28 DIAGNOSIS — M25562 Pain in left knee: Secondary | ICD-10-CM | POA: Diagnosis not present

## 2016-09-02 DIAGNOSIS — M545 Low back pain: Secondary | ICD-10-CM | POA: Diagnosis not present

## 2016-09-02 DIAGNOSIS — Z7901 Long term (current) use of anticoagulants: Secondary | ICD-10-CM | POA: Diagnosis not present

## 2016-09-02 DIAGNOSIS — Z6839 Body mass index (BMI) 39.0-39.9, adult: Secondary | ICD-10-CM | POA: Diagnosis not present

## 2016-09-02 DIAGNOSIS — E1065 Type 1 diabetes mellitus with hyperglycemia: Secondary | ICD-10-CM | POA: Diagnosis not present

## 2016-09-23 ENCOUNTER — Other Ambulatory Visit: Payer: Self-pay | Admitting: *Deleted

## 2016-09-23 MED ORDER — METOLAZONE 10 MG PO TABS: 10.0000 mg | ORAL_TABLET | ORAL | 0 refills | Status: DC | PRN

## 2016-09-25 DIAGNOSIS — M25562 Pain in left knee: Secondary | ICD-10-CM | POA: Diagnosis not present

## 2016-09-25 DIAGNOSIS — G8929 Other chronic pain: Secondary | ICD-10-CM | POA: Diagnosis not present

## 2016-10-13 DIAGNOSIS — E1065 Type 1 diabetes mellitus with hyperglycemia: Secondary | ICD-10-CM | POA: Diagnosis not present

## 2016-10-13 DIAGNOSIS — N390 Urinary tract infection, site not specified: Secondary | ICD-10-CM | POA: Diagnosis not present

## 2016-10-13 DIAGNOSIS — Z7901 Long term (current) use of anticoagulants: Secondary | ICD-10-CM | POA: Diagnosis not present

## 2016-10-13 DIAGNOSIS — Z6841 Body Mass Index (BMI) 40.0 and over, adult: Secondary | ICD-10-CM | POA: Diagnosis not present

## 2016-10-13 DIAGNOSIS — M545 Low back pain: Secondary | ICD-10-CM | POA: Diagnosis not present

## 2016-10-13 DIAGNOSIS — E559 Vitamin D deficiency, unspecified: Secondary | ICD-10-CM | POA: Diagnosis not present

## 2016-10-13 DIAGNOSIS — Z79899 Other long term (current) drug therapy: Secondary | ICD-10-CM | POA: Diagnosis not present

## 2016-10-13 DIAGNOSIS — E782 Mixed hyperlipidemia: Secondary | ICD-10-CM | POA: Diagnosis not present

## 2016-10-24 DIAGNOSIS — E119 Type 2 diabetes mellitus without complications: Secondary | ICD-10-CM | POA: Diagnosis not present

## 2016-10-24 DIAGNOSIS — Z794 Long term (current) use of insulin: Secondary | ICD-10-CM | POA: Diagnosis not present

## 2016-10-24 DIAGNOSIS — H2513 Age-related nuclear cataract, bilateral: Secondary | ICD-10-CM | POA: Diagnosis not present

## 2016-10-24 DIAGNOSIS — H40003 Preglaucoma, unspecified, bilateral: Secondary | ICD-10-CM | POA: Diagnosis not present

## 2016-10-24 DIAGNOSIS — H5711 Ocular pain, right eye: Secondary | ICD-10-CM | POA: Diagnosis not present

## 2016-10-29 DIAGNOSIS — F330 Major depressive disorder, recurrent, mild: Secondary | ICD-10-CM | POA: Diagnosis not present

## 2016-10-29 DIAGNOSIS — F411 Generalized anxiety disorder: Secondary | ICD-10-CM | POA: Diagnosis not present

## 2016-11-11 DIAGNOSIS — Z6839 Body mass index (BMI) 39.0-39.9, adult: Secondary | ICD-10-CM | POA: Diagnosis not present

## 2016-11-11 DIAGNOSIS — M545 Low back pain: Secondary | ICD-10-CM | POA: Diagnosis not present

## 2016-11-11 DIAGNOSIS — Z7901 Long term (current) use of anticoagulants: Secondary | ICD-10-CM | POA: Diagnosis not present

## 2016-11-11 DIAGNOSIS — E1065 Type 1 diabetes mellitus with hyperglycemia: Secondary | ICD-10-CM | POA: Diagnosis not present

## 2016-11-25 DIAGNOSIS — M1711 Unilateral primary osteoarthritis, right knee: Secondary | ICD-10-CM | POA: Diagnosis not present

## 2016-11-25 DIAGNOSIS — Z79899 Other long term (current) drug therapy: Secondary | ICD-10-CM | POA: Diagnosis not present

## 2016-12-02 DIAGNOSIS — M25561 Pain in right knee: Secondary | ICD-10-CM | POA: Diagnosis not present

## 2016-12-02 DIAGNOSIS — M25562 Pain in left knee: Secondary | ICD-10-CM | POA: Diagnosis not present

## 2016-12-02 DIAGNOSIS — G8929 Other chronic pain: Secondary | ICD-10-CM | POA: Diagnosis not present

## 2016-12-02 DIAGNOSIS — M6281 Muscle weakness (generalized): Secondary | ICD-10-CM | POA: Diagnosis not present

## 2016-12-09 DIAGNOSIS — M545 Low back pain: Secondary | ICD-10-CM | POA: Diagnosis not present

## 2016-12-09 DIAGNOSIS — Z23 Encounter for immunization: Secondary | ICD-10-CM | POA: Diagnosis not present

## 2016-12-09 DIAGNOSIS — E1140 Type 2 diabetes mellitus with diabetic neuropathy, unspecified: Secondary | ICD-10-CM | POA: Diagnosis not present

## 2016-12-09 DIAGNOSIS — Z6839 Body mass index (BMI) 39.0-39.9, adult: Secondary | ICD-10-CM | POA: Diagnosis not present

## 2016-12-09 DIAGNOSIS — Z7901 Long term (current) use of anticoagulants: Secondary | ICD-10-CM | POA: Diagnosis not present

## 2016-12-22 ENCOUNTER — Other Ambulatory Visit: Payer: Self-pay | Admitting: Cardiology

## 2016-12-31 DIAGNOSIS — F331 Major depressive disorder, recurrent, moderate: Secondary | ICD-10-CM | POA: Diagnosis not present

## 2016-12-31 DIAGNOSIS — F418 Other specified anxiety disorders: Secondary | ICD-10-CM | POA: Diagnosis not present

## 2017-01-06 DIAGNOSIS — M25562 Pain in left knee: Secondary | ICD-10-CM | POA: Diagnosis not present

## 2017-01-06 DIAGNOSIS — G8929 Other chronic pain: Secondary | ICD-10-CM | POA: Diagnosis not present

## 2017-01-13 DIAGNOSIS — E559 Vitamin D deficiency, unspecified: Secondary | ICD-10-CM | POA: Diagnosis not present

## 2017-01-13 DIAGNOSIS — Z7901 Long term (current) use of anticoagulants: Secondary | ICD-10-CM | POA: Diagnosis not present

## 2017-01-13 DIAGNOSIS — E782 Mixed hyperlipidemia: Secondary | ICD-10-CM | POA: Diagnosis not present

## 2017-01-13 DIAGNOSIS — Z6839 Body mass index (BMI) 39.0-39.9, adult: Secondary | ICD-10-CM | POA: Diagnosis not present

## 2017-01-13 DIAGNOSIS — E1140 Type 2 diabetes mellitus with diabetic neuropathy, unspecified: Secondary | ICD-10-CM | POA: Diagnosis not present

## 2017-01-13 DIAGNOSIS — M545 Low back pain: Secondary | ICD-10-CM | POA: Diagnosis not present

## 2017-01-13 DIAGNOSIS — Z79899 Other long term (current) drug therapy: Secondary | ICD-10-CM | POA: Diagnosis not present

## 2017-01-27 DIAGNOSIS — Z6841 Body Mass Index (BMI) 40.0 and over, adult: Secondary | ICD-10-CM | POA: Diagnosis not present

## 2017-01-27 DIAGNOSIS — G4733 Obstructive sleep apnea (adult) (pediatric): Secondary | ICD-10-CM | POA: Diagnosis not present

## 2017-01-27 DIAGNOSIS — I10 Essential (primary) hypertension: Secondary | ICD-10-CM | POA: Diagnosis not present

## 2017-01-27 DIAGNOSIS — E119 Type 2 diabetes mellitus without complications: Secondary | ICD-10-CM | POA: Diagnosis not present

## 2017-01-27 DIAGNOSIS — I5032 Chronic diastolic (congestive) heart failure: Secondary | ICD-10-CM | POA: Diagnosis not present

## 2017-01-27 DIAGNOSIS — Z7901 Long term (current) use of anticoagulants: Secondary | ICD-10-CM | POA: Diagnosis not present

## 2017-01-27 DIAGNOSIS — Z96641 Presence of right artificial hip joint: Secondary | ICD-10-CM | POA: Diagnosis not present

## 2017-01-27 DIAGNOSIS — G2581 Restless legs syndrome: Secondary | ICD-10-CM | POA: Diagnosis not present

## 2017-01-27 DIAGNOSIS — I482 Chronic atrial fibrillation: Secondary | ICD-10-CM | POA: Diagnosis not present

## 2017-01-27 DIAGNOSIS — R32 Unspecified urinary incontinence: Secondary | ICD-10-CM | POA: Diagnosis not present

## 2017-01-27 DIAGNOSIS — M170 Bilateral primary osteoarthritis of knee: Secondary | ICD-10-CM | POA: Diagnosis not present

## 2017-02-09 DIAGNOSIS — I482 Chronic atrial fibrillation: Secondary | ICD-10-CM | POA: Diagnosis not present

## 2017-02-09 DIAGNOSIS — E119 Type 2 diabetes mellitus without complications: Secondary | ICD-10-CM | POA: Diagnosis not present

## 2017-02-09 DIAGNOSIS — Z471 Aftercare following joint replacement surgery: Secondary | ICD-10-CM | POA: Diagnosis not present

## 2017-02-09 DIAGNOSIS — M6281 Muscle weakness (generalized): Secondary | ICD-10-CM | POA: Diagnosis not present

## 2017-02-09 DIAGNOSIS — Z6841 Body Mass Index (BMI) 40.0 and over, adult: Secondary | ICD-10-CM | POA: Diagnosis not present

## 2017-02-09 DIAGNOSIS — M23231 Derangement of other medial meniscus due to old tear or injury, right knee: Secondary | ICD-10-CM | POA: Diagnosis not present

## 2017-02-09 DIAGNOSIS — Z7901 Long term (current) use of anticoagulants: Secondary | ICD-10-CM | POA: Diagnosis not present

## 2017-02-09 DIAGNOSIS — Z96651 Presence of right artificial knee joint: Secondary | ICD-10-CM | POA: Diagnosis not present

## 2017-02-09 DIAGNOSIS — F411 Generalized anxiety disorder: Secondary | ICD-10-CM | POA: Diagnosis present

## 2017-02-09 DIAGNOSIS — F419 Anxiety disorder, unspecified: Secondary | ICD-10-CM | POA: Diagnosis present

## 2017-02-09 DIAGNOSIS — Z8669 Personal history of other diseases of the nervous system and sense organs: Secondary | ICD-10-CM | POA: Diagnosis not present

## 2017-02-09 DIAGNOSIS — E876 Hypokalemia: Secondary | ICD-10-CM | POA: Diagnosis present

## 2017-02-09 DIAGNOSIS — R278 Other lack of coordination: Secondary | ICD-10-CM | POA: Diagnosis not present

## 2017-02-09 DIAGNOSIS — F329 Major depressive disorder, single episode, unspecified: Secondary | ICD-10-CM | POA: Diagnosis present

## 2017-02-09 DIAGNOSIS — Z96641 Presence of right artificial hip joint: Secondary | ICD-10-CM | POA: Diagnosis not present

## 2017-02-09 DIAGNOSIS — G541 Lumbosacral plexus disorders: Secondary | ICD-10-CM | POA: Diagnosis present

## 2017-02-09 DIAGNOSIS — Z87440 Personal history of urinary (tract) infections: Secondary | ICD-10-CM | POA: Diagnosis not present

## 2017-02-09 DIAGNOSIS — F418 Other specified anxiety disorders: Secondary | ICD-10-CM | POA: Diagnosis not present

## 2017-02-09 DIAGNOSIS — R531 Weakness: Secondary | ICD-10-CM | POA: Diagnosis not present

## 2017-02-09 DIAGNOSIS — R41841 Cognitive communication deficit: Secondary | ICD-10-CM | POA: Diagnosis not present

## 2017-02-09 DIAGNOSIS — I10 Essential (primary) hypertension: Secondary | ICD-10-CM | POA: Diagnosis not present

## 2017-02-09 DIAGNOSIS — G252 Other specified forms of tremor: Secondary | ICD-10-CM | POA: Diagnosis present

## 2017-02-09 DIAGNOSIS — K5900 Constipation, unspecified: Secondary | ICD-10-CM | POA: Diagnosis not present

## 2017-02-09 DIAGNOSIS — M797 Fibromyalgia: Secondary | ICD-10-CM | POA: Diagnosis present

## 2017-02-09 DIAGNOSIS — H9193 Unspecified hearing loss, bilateral: Secondary | ICD-10-CM | POA: Diagnosis present

## 2017-02-09 DIAGNOSIS — R768 Other specified abnormal immunological findings in serum: Secondary | ICD-10-CM | POA: Diagnosis not present

## 2017-02-09 DIAGNOSIS — G4730 Sleep apnea, unspecified: Secondary | ICD-10-CM | POA: Diagnosis not present

## 2017-02-09 DIAGNOSIS — R2681 Unsteadiness on feet: Secondary | ICD-10-CM | POA: Diagnosis not present

## 2017-02-09 DIAGNOSIS — M170 Bilateral primary osteoarthritis of knee: Secondary | ICD-10-CM | POA: Diagnosis not present

## 2017-02-09 DIAGNOSIS — F320 Major depressive disorder, single episode, mild: Secondary | ICD-10-CM | POA: Diagnosis not present

## 2017-02-09 DIAGNOSIS — R262 Difficulty in walking, not elsewhere classified: Secondary | ICD-10-CM | POA: Diagnosis not present

## 2017-02-09 DIAGNOSIS — M6250 Muscle wasting and atrophy, not elsewhere classified, unspecified site: Secondary | ICD-10-CM | POA: Diagnosis not present

## 2017-02-09 DIAGNOSIS — I509 Heart failure, unspecified: Secondary | ICD-10-CM | POA: Diagnosis not present

## 2017-02-09 DIAGNOSIS — G4733 Obstructive sleep apnea (adult) (pediatric): Secondary | ICD-10-CM | POA: Diagnosis present

## 2017-02-09 DIAGNOSIS — M62551 Muscle wasting and atrophy, not elsewhere classified, right thigh: Secondary | ICD-10-CM | POA: Diagnosis not present

## 2017-02-09 DIAGNOSIS — G8918 Other acute postprocedural pain: Secondary | ICD-10-CM | POA: Diagnosis not present

## 2017-02-09 DIAGNOSIS — I4891 Unspecified atrial fibrillation: Secondary | ICD-10-CM | POA: Diagnosis not present

## 2017-02-09 DIAGNOSIS — M1711 Unilateral primary osteoarthritis, right knee: Secondary | ICD-10-CM | POA: Diagnosis not present

## 2017-02-09 DIAGNOSIS — I110 Hypertensive heart disease with heart failure: Secondary | ICD-10-CM | POA: Diagnosis not present

## 2017-02-09 DIAGNOSIS — I5032 Chronic diastolic (congestive) heart failure: Secondary | ICD-10-CM | POA: Diagnosis not present

## 2017-02-09 DIAGNOSIS — E1165 Type 2 diabetes mellitus with hyperglycemia: Secondary | ICD-10-CM | POA: Diagnosis not present

## 2017-02-13 DIAGNOSIS — R41841 Cognitive communication deficit: Secondary | ICD-10-CM | POA: Diagnosis not present

## 2017-02-13 DIAGNOSIS — Z6841 Body Mass Index (BMI) 40.0 and over, adult: Secondary | ICD-10-CM | POA: Diagnosis not present

## 2017-02-13 DIAGNOSIS — I5032 Chronic diastolic (congestive) heart failure: Secondary | ICD-10-CM | POA: Diagnosis not present

## 2017-02-13 DIAGNOSIS — I2510 Atherosclerotic heart disease of native coronary artery without angina pectoris: Secondary | ICD-10-CM | POA: Diagnosis not present

## 2017-02-13 DIAGNOSIS — Z471 Aftercare following joint replacement surgery: Secondary | ICD-10-CM | POA: Diagnosis not present

## 2017-02-13 DIAGNOSIS — Z7901 Long term (current) use of anticoagulants: Secondary | ICD-10-CM | POA: Diagnosis not present

## 2017-02-13 DIAGNOSIS — E1140 Type 2 diabetes mellitus with diabetic neuropathy, unspecified: Secondary | ICD-10-CM | POA: Diagnosis not present

## 2017-02-13 DIAGNOSIS — G8918 Other acute postprocedural pain: Secondary | ICD-10-CM | POA: Diagnosis not present

## 2017-02-13 DIAGNOSIS — R262 Difficulty in walking, not elsewhere classified: Secondary | ICD-10-CM | POA: Diagnosis not present

## 2017-02-13 DIAGNOSIS — E668 Other obesity: Secondary | ICD-10-CM | POA: Diagnosis not present

## 2017-02-13 DIAGNOSIS — R278 Other lack of coordination: Secondary | ICD-10-CM | POA: Diagnosis not present

## 2017-02-13 DIAGNOSIS — E119 Type 2 diabetes mellitus without complications: Secondary | ICD-10-CM | POA: Diagnosis not present

## 2017-02-13 DIAGNOSIS — I4891 Unspecified atrial fibrillation: Secondary | ICD-10-CM | POA: Diagnosis not present

## 2017-02-13 DIAGNOSIS — M6250 Muscle wasting and atrophy, not elsewhere classified, unspecified site: Secondary | ICD-10-CM | POA: Diagnosis not present

## 2017-02-13 DIAGNOSIS — F320 Major depressive disorder, single episode, mild: Secondary | ICD-10-CM | POA: Diagnosis not present

## 2017-02-13 DIAGNOSIS — G4733 Obstructive sleep apnea (adult) (pediatric): Secondary | ICD-10-CM | POA: Diagnosis not present

## 2017-02-13 DIAGNOSIS — I10 Essential (primary) hypertension: Secondary | ICD-10-CM | POA: Diagnosis not present

## 2017-02-13 DIAGNOSIS — I482 Chronic atrial fibrillation: Secondary | ICD-10-CM | POA: Diagnosis not present

## 2017-02-13 DIAGNOSIS — Z96651 Presence of right artificial knee joint: Secondary | ICD-10-CM | POA: Diagnosis not present

## 2017-02-13 DIAGNOSIS — F419 Anxiety disorder, unspecified: Secondary | ICD-10-CM | POA: Diagnosis not present

## 2017-02-13 DIAGNOSIS — Z8669 Personal history of other diseases of the nervous system and sense organs: Secondary | ICD-10-CM | POA: Diagnosis not present

## 2017-02-13 DIAGNOSIS — M6281 Muscle weakness (generalized): Secondary | ICD-10-CM | POA: Diagnosis not present

## 2017-02-13 DIAGNOSIS — M170 Bilateral primary osteoarthritis of knee: Secondary | ICD-10-CM | POA: Diagnosis not present

## 2017-02-13 DIAGNOSIS — Z96641 Presence of right artificial hip joint: Secondary | ICD-10-CM | POA: Diagnosis not present

## 2017-02-13 DIAGNOSIS — R531 Weakness: Secondary | ICD-10-CM | POA: Diagnosis not present

## 2017-02-13 DIAGNOSIS — M1711 Unilateral primary osteoarthritis, right knee: Secondary | ICD-10-CM | POA: Diagnosis not present

## 2017-02-13 DIAGNOSIS — R2681 Unsteadiness on feet: Secondary | ICD-10-CM | POA: Diagnosis not present

## 2017-02-13 DIAGNOSIS — M62551 Muscle wasting and atrophy, not elsewhere classified, right thigh: Secondary | ICD-10-CM | POA: Diagnosis not present

## 2017-02-15 DIAGNOSIS — E668 Other obesity: Secondary | ICD-10-CM | POA: Diagnosis not present

## 2017-02-15 DIAGNOSIS — I10 Essential (primary) hypertension: Secondary | ICD-10-CM | POA: Diagnosis not present

## 2017-02-15 DIAGNOSIS — I482 Chronic atrial fibrillation: Secondary | ICD-10-CM | POA: Diagnosis not present

## 2017-02-15 DIAGNOSIS — E1140 Type 2 diabetes mellitus with diabetic neuropathy, unspecified: Secondary | ICD-10-CM | POA: Diagnosis not present

## 2017-02-15 DIAGNOSIS — G4733 Obstructive sleep apnea (adult) (pediatric): Secondary | ICD-10-CM | POA: Diagnosis not present

## 2017-02-15 DIAGNOSIS — Z96651 Presence of right artificial knee joint: Secondary | ICD-10-CM | POA: Diagnosis not present

## 2017-02-15 DIAGNOSIS — I2510 Atherosclerotic heart disease of native coronary artery without angina pectoris: Secondary | ICD-10-CM | POA: Diagnosis not present

## 2017-03-12 ENCOUNTER — Ambulatory Visit: Payer: Medicare Other | Admitting: Cardiology

## 2017-03-19 DIAGNOSIS — M545 Low back pain: Secondary | ICD-10-CM | POA: Diagnosis not present

## 2017-03-19 DIAGNOSIS — Z6839 Body mass index (BMI) 39.0-39.9, adult: Secondary | ICD-10-CM | POA: Diagnosis not present

## 2017-03-19 DIAGNOSIS — M25559 Pain in unspecified hip: Secondary | ICD-10-CM | POA: Diagnosis not present

## 2017-03-19 DIAGNOSIS — Z1231 Encounter for screening mammogram for malignant neoplasm of breast: Secondary | ICD-10-CM | POA: Diagnosis not present

## 2017-03-19 DIAGNOSIS — Z7901 Long term (current) use of anticoagulants: Secondary | ICD-10-CM | POA: Diagnosis not present

## 2017-03-19 DIAGNOSIS — E1140 Type 2 diabetes mellitus with diabetic neuropathy, unspecified: Secondary | ICD-10-CM | POA: Diagnosis not present

## 2017-03-20 ENCOUNTER — Other Ambulatory Visit: Payer: Self-pay | Admitting: *Deleted

## 2017-03-20 DIAGNOSIS — Z96651 Presence of right artificial knee joint: Secondary | ICD-10-CM | POA: Diagnosis not present

## 2017-03-20 DIAGNOSIS — M810 Age-related osteoporosis without current pathological fracture: Secondary | ICD-10-CM | POA: Diagnosis not present

## 2017-03-20 DIAGNOSIS — M1711 Unilateral primary osteoarthritis, right knee: Secondary | ICD-10-CM | POA: Diagnosis not present

## 2017-03-25 ENCOUNTER — Other Ambulatory Visit: Payer: Self-pay

## 2017-03-26 ENCOUNTER — Telehealth: Payer: Self-pay | Admitting: Cardiology

## 2017-03-26 MED ORDER — FUROSEMIDE 80 MG PO TABS: 80.0000 mg | ORAL_TABLET | Freq: Two times a day (BID) | ORAL | 1 refills | Status: DC

## 2017-03-26 NOTE — Addendum Note (Signed)
Addended by: Venetia Maxon on: 03/26/2017 04:06 PM   Modules accepted: Orders

## 2017-03-26 NOTE — Telephone Encounter (Signed)
New Message    *STAT* If patient is at the pharmacy, call can be transferred to refill team.   1. Which medications need to be refilled? (please list name of each medication and dose if known) furosemide (LASIX) 80 MG tablet 2. Which pharmacy/location (including street and city if local pharmacy) is medication to be sent to? [facility] [place]  3. Do they need a 30 day or 90 day supply? [facility]

## 2017-04-01 DIAGNOSIS — M25561 Pain in right knee: Secondary | ICD-10-CM | POA: Diagnosis not present

## 2017-04-01 DIAGNOSIS — M1711 Unilateral primary osteoarthritis, right knee: Secondary | ICD-10-CM | POA: Diagnosis not present

## 2017-04-01 DIAGNOSIS — R262 Difficulty in walking, not elsewhere classified: Secondary | ICD-10-CM | POA: Diagnosis not present

## 2017-04-02 DIAGNOSIS — M25561 Pain in right knee: Secondary | ICD-10-CM | POA: Diagnosis not present

## 2017-04-02 DIAGNOSIS — M1711 Unilateral primary osteoarthritis, right knee: Secondary | ICD-10-CM | POA: Diagnosis not present

## 2017-04-02 DIAGNOSIS — R262 Difficulty in walking, not elsewhere classified: Secondary | ICD-10-CM | POA: Diagnosis not present

## 2017-04-06 DIAGNOSIS — M1711 Unilateral primary osteoarthritis, right knee: Secondary | ICD-10-CM | POA: Diagnosis not present

## 2017-04-06 DIAGNOSIS — M25561 Pain in right knee: Secondary | ICD-10-CM | POA: Diagnosis not present

## 2017-04-06 DIAGNOSIS — R262 Difficulty in walking, not elsewhere classified: Secondary | ICD-10-CM | POA: Diagnosis not present

## 2017-04-08 DIAGNOSIS — M25561 Pain in right knee: Secondary | ICD-10-CM | POA: Diagnosis not present

## 2017-04-08 DIAGNOSIS — R262 Difficulty in walking, not elsewhere classified: Secondary | ICD-10-CM | POA: Diagnosis not present

## 2017-04-08 DIAGNOSIS — M1711 Unilateral primary osteoarthritis, right knee: Secondary | ICD-10-CM | POA: Diagnosis not present

## 2017-04-13 DIAGNOSIS — M25561 Pain in right knee: Secondary | ICD-10-CM | POA: Diagnosis not present

## 2017-04-13 DIAGNOSIS — M1711 Unilateral primary osteoarthritis, right knee: Secondary | ICD-10-CM | POA: Diagnosis not present

## 2017-04-13 DIAGNOSIS — R262 Difficulty in walking, not elsewhere classified: Secondary | ICD-10-CM | POA: Diagnosis not present

## 2017-04-14 ENCOUNTER — Ambulatory Visit: Payer: Medicare Other | Admitting: Cardiology

## 2017-04-14 NOTE — Progress Notes (Deleted)
PCP: Guadlupe Spanish, MD  Clinic Note: No chief complaint on file.   HPI: Donna [name] is a 67 y.o. female with a PMH below who presents today for 4 month f/u - HFPF; chronic LE Edema Donna [name] is a 67 y.o. female initially seen for follow-up Afib & HFPF/Edema with Pre-op Assessment at the request of Arvind, Aldean Baker, MD - as well as Dr. Jacqulynn Cadet from [place] has bounced around several different cardiologists, and is carried a diagnosis of active heart failure is now seeing her third of not fourth cardiologist. She has carried a diagnosis of congestive heart failure, however she has had essentially normal echocardiographic evaluation and also recent cardiac cath that was negative for any ischemic coronary disease.Marland Kitchen  He does have essentially chronic persistent atrial fibrillation (probably permanent) She basically has OSA and likely obesity hypoventilation syndrome along with chronic lower extremity edema that I think is probably more related to venous stasis and potentially right-sided failure however the echocardiogram did not suggest it.  Just to reassess her EF and right-sided pressures we did recheck an echocardiogram.  I also had her start taking her 80 mg furosemide twice a day with metolazone prior to her first dose. We talked by sliding scale.   She was feeling much better when I saw her back in January 2018.  No sign of chronotropic incompetence.  More likely venous stasis edema as opposed to related to diastolic heart failure.  Metolazone changed to as needed.  ACE inhibitor reduced due to hypotension.  Recommended continued use of CPAP  Donna [name] was last seen back in May smile in the face.  Remains wheelchair-bound, but eagerly anticipating potential knee surgery.   C had improved, she had lost weight.  Hip pain was doing better after aspiration.  Edema was stable using standing dose of Lasix with as needed Zaroxolyn.  Still noted some orthostatic dizziness.   No symptoms to suggest that she was feeling her A. fib.  Surgery.  She was actually doing better at that time. No sign of chronotropic incompetence from her chronic A. fib. It would appear that her edema is probably more related to venous stasis and chronic diastolic heart failure. On stable regimen. We did back down on her diuretic regimen to using the metolazone when necessary. We also back down on her ACE inhibitor due to hypotension. Despite having edema, we want her to hydrate but limited to 2 L per day. Use CPAP.  Recent Hospitalizations: n/a  Seen by Dr. Jacqulynn Cadet from [place] - pending R knee Sgx - needs to loose wgt, started on low carb diet.  Studies Personally Reviewed - (if available, images/films reviewed: From Epic Chart or Care Everywhere)  none  Interval History: Donna [name] presents  No syncope/near syncope, or TIA/amaurosis fugax symptoms. No melena, hematochezia, hematuria, or epstaxis. No claudication - walking is limited by her knee pain and hip pain.  ROS: A comprehensive was performed. Review of Systems  Constitutional: Negative for malaise/fatigue (Energy improving. Is looking forward to trying to walk once her knee is fixed).  HENT: Negative for congestion and nosebleeds.   Respiratory: Negative for cough and wheezing.   Cardiovascular: Positive for leg swelling (Notably improved).  Gastrointestinal: Negative for heartburn.  Musculoskeletal: Positive for joint pain (Right knee and both hips) and myalgias (Rare cramps).  Neurological: Positive for dizziness. Negative for focal weakness.  Endo/Heme/Allergies: Negative for environmental allergies. Does not bruise/bleed easily.  Psychiatric/Behavioral: Negative  for depression (Seems to be in good spirits) and memory loss. The patient is not nervous/anxious and does not have insomnia.    I have reviewed and (if needed) personally updated the patient's problem list, medications, allergies, past medical and surgical  history, social and family history.   Past Medical History:  Diagnosis Date  . Chronic /permanent atrial fibrillation ([place]) 2005   On Warfarin (follwed @ [facility]); Rate controlled - On BB  . Chronic diastolic heart failure, NYHA class 2 ([place])    Updated by A. fib, hypertensive heart disease and obesity; EDP was only 7 by cardiac catheterization in September 2017  . Diabetes mellitus without complication ([place])   . Diabetic lumbosacral plexopathy ([place])   . Fibromyalgia   . Generalized anxiety disorder   . Incontinence   . Major depressive disorder   . Restless leg syndrome     Past Surgical History:  Procedure Laterality Date  . ABDOMINAL HYSTERECTOMY    . CARDIAC CATHETERIZATION  11/09/2015   [facility]: Nonocclusive CAD. EF 55%. EDP 7 mmHg.  Marland Kitchen CHOLECYSTECTOMY    . HIP SURGERY Right    x2  . KNEE ARTHROSCOPY Right   . MASS EXCISION     sarcoma - shoulder  . ROTATOR CUFF REPAIR Bilateral   . TONSILLECTOMY    . TRANSTHORACIC ECHOCARDIOGRAM  11/2013; 02/2016   a) [facility] [place]: with [facility]:  EF 60-65%. Mod LA dilation. Mild AoV sclerosis. Normal RHP. Indeterminate LV filling pressures. b) [facility]: Normal cavity size. EF 55-60%.no RWMA, mild AoV sclerosis (R cusp), Mod MAC. Mild biAtrial dilation.   Chest CTA September 2015: Cardiomegaly noted with pulmonary edema and small bilateral effusions noted. Aortic calcification noted suggesting of aortic atherosclerosis.   No outpatient medications have been marked as taking for the 04/14/17 encounter (Appointment) with Leonie Man, MD.    Allergies  Allergen Reactions  . Bee Venom Hives    Takes benadryl  . Fentanyl Other (See Comments)  . Metformin Diarrhea  . Metformin And Related Diarrhea  . Metformin Hcl Diarrhea  . Other Other (See Comments)    Raw foods with seeds give her "boils".  Avoids raw strawberries, blueberries. Tolerates cooked fruits, tomato sauce, bread/grains with seeds.  [facility] 10/17/13 Berries with seeds  . Alteplase Rash  . Cefepime Rash    Social History   Socioeconomic History  . Marital status: Married    Spouse name: Not on file  . Number of children: Not on file  . Years of education: Not on file  . Highest education level: Not on file  Social Needs  . Financial resource strain: Not on file  . Food insecurity - worry: Not on file  . Food insecurity - inability: Not on file  . Transportation needs - medical: Not on file  . Transportation needs - non-medical: Not on file  Occupational History  . Not on file  Tobacco Use  . Smoking status: Never Smoker  . Smokeless tobacco: Never Used  Substance and Sexual Activity  . Alcohol use: No  . Drug use: No  . Sexual activity: Not on file  Other Topics Concern  . Not on file  Social History Narrative  . Not on file    family history includes Heart disease in her mother; Hypertension in her mother; Leukemia in her son.  Wt Readings from Last 3 Encounters:  07/25/16 279 lb 6.4 oz (126.7 kg)  03/26/16 289 lb (131.1 kg)  02/06/16 294 lb (  133.4 kg)  -- trying to loose weight - for knee surgery.  PHYSICAL EXAM There were no vitals taken for this visit.  Physical Exam  Nursing note and vitals reviewed.  General appearance: alert, cooperative, appears stated age, no distress. Moderately obese by BMI, but with her risk factors would be morbidly obese.  HEENT: [place]/AT, EOMI, MMM, anicteric sclera; poor dentition  Neck: no adenopathy, no carotid bruit and no JVD Lungs: clear to auscultation bilaterally, normal percussion bilaterally and non-labored Heart:Irregularly irregular rhythm with regular rate. Distant S1 and S2  but normal. No rubs or gallops but there is a roughly 2/6 SEM at RUSB is stable.  Abdomen: soft, non-tender; bowel sounds normal; no masses,  no organomegaly; significant truncal obesity, but notably improved.  Extremities: extremities normal, atraumatic, no cyanosis, and edema -  trivial with venous stasis changes still present.  Pulses: 2+ and symmetric; Neurologic: Mental status: Alert & oriented x 3, thought content appropriate; non-focal exam.  Pleasant mood & affect.    Adult ECG Report n/a  Other studies Reviewed: Additional studies/ records that were reviewed today include:  Recent Labs: 07/12/2016, from PCP  Na+ - , K+ 3.9, Cl- 102, HCO3- 27 , BUN 19, Cr 0.83, Glu 94, Ca2+  9.2; AST 19, ALT, 19, AlkP 84  CBC: W 7.2, Hgb 12.5; A1c 7.4  TC 134, TG 70, HDL 53, LDL 67   ASSESSMENT / PLAN: Problem List Items Addressed This Visit    Chronic atrial fibrillation ([place]): CHA2DS2-VASc Score 5; on Warfarin - Primary (Chronic)   Essential hypertension (Chronic)   Morbid obesity ([facility]) (Chronic)   Venous stasis of both lower extremities (Chronic)      Current medicines are reviewed at length with the patient today. (+/- concerns) Dizziness The following changes have been made: Reducing lisinopril dose  There are no Patient Instructions on file for this visit.  Studies Ordered:   No orders of the defined types were placed in this encounter.     Donna [name], M.D., M.S. Interventional Cardiologist   Pager # [phone] Phone # [phone] [address] [place], [place] [address]

## 2017-04-15 DIAGNOSIS — M25561 Pain in right knee: Secondary | ICD-10-CM | POA: Diagnosis not present

## 2017-04-15 DIAGNOSIS — R262 Difficulty in walking, not elsewhere classified: Secondary | ICD-10-CM | POA: Diagnosis not present

## 2017-04-15 DIAGNOSIS — M1711 Unilateral primary osteoarthritis, right knee: Secondary | ICD-10-CM | POA: Diagnosis not present

## 2017-04-20 DIAGNOSIS — F330 Major depressive disorder, recurrent, mild: Secondary | ICD-10-CM | POA: Diagnosis not present

## 2017-04-20 DIAGNOSIS — F411 Generalized anxiety disorder: Secondary | ICD-10-CM | POA: Diagnosis not present

## 2017-04-21 DIAGNOSIS — M1711 Unilateral primary osteoarthritis, right knee: Secondary | ICD-10-CM | POA: Diagnosis not present

## 2017-04-21 DIAGNOSIS — R262 Difficulty in walking, not elsewhere classified: Secondary | ICD-10-CM | POA: Diagnosis not present

## 2017-04-21 DIAGNOSIS — M25561 Pain in right knee: Secondary | ICD-10-CM | POA: Diagnosis not present

## 2017-04-23 DIAGNOSIS — M1711 Unilateral primary osteoarthritis, right knee: Secondary | ICD-10-CM | POA: Diagnosis not present

## 2017-04-23 DIAGNOSIS — M25561 Pain in right knee: Secondary | ICD-10-CM | POA: Diagnosis not present

## 2017-04-23 DIAGNOSIS — R262 Difficulty in walking, not elsewhere classified: Secondary | ICD-10-CM | POA: Diagnosis not present

## 2017-04-27 DIAGNOSIS — R262 Difficulty in walking, not elsewhere classified: Secondary | ICD-10-CM | POA: Diagnosis not present

## 2017-04-27 DIAGNOSIS — M25561 Pain in right knee: Secondary | ICD-10-CM | POA: Diagnosis not present

## 2017-04-27 DIAGNOSIS — M1711 Unilateral primary osteoarthritis, right knee: Secondary | ICD-10-CM | POA: Diagnosis not present

## 2017-04-28 DIAGNOSIS — R0982 Postnasal drip: Secondary | ICD-10-CM | POA: Diagnosis not present

## 2017-04-28 DIAGNOSIS — G629 Polyneuropathy, unspecified: Secondary | ICD-10-CM | POA: Diagnosis not present

## 2017-04-28 DIAGNOSIS — E559 Vitamin D deficiency, unspecified: Secondary | ICD-10-CM | POA: Diagnosis not present

## 2017-04-28 DIAGNOSIS — Z7901 Long term (current) use of anticoagulants: Secondary | ICD-10-CM | POA: Diagnosis not present

## 2017-04-28 DIAGNOSIS — M545 Low back pain: Secondary | ICD-10-CM | POA: Diagnosis not present

## 2017-04-28 DIAGNOSIS — E782 Mixed hyperlipidemia: Secondary | ICD-10-CM | POA: Diagnosis not present

## 2017-04-28 DIAGNOSIS — E041 Nontoxic single thyroid nodule: Secondary | ICD-10-CM | POA: Diagnosis not present

## 2017-04-28 DIAGNOSIS — E1140 Type 2 diabetes mellitus with diabetic neuropathy, unspecified: Secondary | ICD-10-CM | POA: Diagnosis not present

## 2017-04-28 DIAGNOSIS — Z6839 Body mass index (BMI) 39.0-39.9, adult: Secondary | ICD-10-CM | POA: Diagnosis not present

## 2017-04-28 DIAGNOSIS — N390 Urinary tract infection, site not specified: Secondary | ICD-10-CM | POA: Diagnosis not present

## 2017-04-28 DIAGNOSIS — Z79899 Other long term (current) drug therapy: Secondary | ICD-10-CM | POA: Diagnosis not present

## 2017-04-29 DIAGNOSIS — R262 Difficulty in walking, not elsewhere classified: Secondary | ICD-10-CM | POA: Diagnosis not present

## 2017-04-29 DIAGNOSIS — M1711 Unilateral primary osteoarthritis, right knee: Secondary | ICD-10-CM | POA: Diagnosis not present

## 2017-04-29 DIAGNOSIS — M25561 Pain in right knee: Secondary | ICD-10-CM | POA: Diagnosis not present

## 2017-05-04 DIAGNOSIS — R262 Difficulty in walking, not elsewhere classified: Secondary | ICD-10-CM | POA: Diagnosis not present

## 2017-05-04 DIAGNOSIS — M1711 Unilateral primary osteoarthritis, right knee: Secondary | ICD-10-CM | POA: Diagnosis not present

## 2017-05-04 DIAGNOSIS — M25561 Pain in right knee: Secondary | ICD-10-CM | POA: Diagnosis not present

## 2017-05-07 DIAGNOSIS — R262 Difficulty in walking, not elsewhere classified: Secondary | ICD-10-CM | POA: Diagnosis not present

## 2017-05-07 DIAGNOSIS — M1711 Unilateral primary osteoarthritis, right knee: Secondary | ICD-10-CM | POA: Diagnosis not present

## 2017-05-07 DIAGNOSIS — M25561 Pain in right knee: Secondary | ICD-10-CM | POA: Diagnosis not present

## 2017-05-14 DIAGNOSIS — M25561 Pain in right knee: Secondary | ICD-10-CM | POA: Diagnosis not present

## 2017-05-14 DIAGNOSIS — R262 Difficulty in walking, not elsewhere classified: Secondary | ICD-10-CM | POA: Diagnosis not present

## 2017-05-14 DIAGNOSIS — M1711 Unilateral primary osteoarthritis, right knee: Secondary | ICD-10-CM | POA: Diagnosis not present

## 2017-05-18 DIAGNOSIS — T148XXA Other injury of unspecified body region, initial encounter: Secondary | ICD-10-CM | POA: Diagnosis not present

## 2017-05-18 DIAGNOSIS — Z6839 Body mass index (BMI) 39.0-39.9, adult: Secondary | ICD-10-CM | POA: Diagnosis not present

## 2017-05-18 DIAGNOSIS — M5135 Other intervertebral disc degeneration, thoracolumbar region: Secondary | ICD-10-CM | POA: Diagnosis not present

## 2017-05-19 DIAGNOSIS — M1711 Unilateral primary osteoarthritis, right knee: Secondary | ICD-10-CM | POA: Diagnosis not present

## 2017-05-19 DIAGNOSIS — M25561 Pain in right knee: Secondary | ICD-10-CM | POA: Diagnosis not present

## 2017-05-19 DIAGNOSIS — R262 Difficulty in walking, not elsewhere classified: Secondary | ICD-10-CM | POA: Diagnosis not present

## 2017-05-22 DIAGNOSIS — M25561 Pain in right knee: Secondary | ICD-10-CM | POA: Diagnosis not present

## 2017-05-22 DIAGNOSIS — M1711 Unilateral primary osteoarthritis, right knee: Secondary | ICD-10-CM | POA: Diagnosis not present

## 2017-05-22 DIAGNOSIS — R262 Difficulty in walking, not elsewhere classified: Secondary | ICD-10-CM | POA: Diagnosis not present

## 2017-05-26 DIAGNOSIS — Z7901 Long term (current) use of anticoagulants: Secondary | ICD-10-CM | POA: Diagnosis not present

## 2017-05-26 DIAGNOSIS — L729 Follicular cyst of the skin and subcutaneous tissue, unspecified: Secondary | ICD-10-CM | POA: Diagnosis not present

## 2017-05-26 DIAGNOSIS — M545 Low back pain: Secondary | ICD-10-CM | POA: Diagnosis not present

## 2017-05-26 DIAGNOSIS — Z6839 Body mass index (BMI) 39.0-39.9, adult: Secondary | ICD-10-CM | POA: Diagnosis not present

## 2017-05-26 DIAGNOSIS — E1140 Type 2 diabetes mellitus with diabetic neuropathy, unspecified: Secondary | ICD-10-CM | POA: Diagnosis not present

## 2017-05-27 DIAGNOSIS — M1711 Unilateral primary osteoarthritis, right knee: Secondary | ICD-10-CM | POA: Diagnosis not present

## 2017-05-27 DIAGNOSIS — M25561 Pain in right knee: Secondary | ICD-10-CM | POA: Diagnosis not present

## 2017-05-27 DIAGNOSIS — R262 Difficulty in walking, not elsewhere classified: Secondary | ICD-10-CM | POA: Diagnosis not present

## 2017-06-01 DIAGNOSIS — M25561 Pain in right knee: Secondary | ICD-10-CM | POA: Diagnosis not present

## 2017-06-01 DIAGNOSIS — R262 Difficulty in walking, not elsewhere classified: Secondary | ICD-10-CM | POA: Diagnosis not present

## 2017-06-01 DIAGNOSIS — M1711 Unilateral primary osteoarthritis, right knee: Secondary | ICD-10-CM | POA: Diagnosis not present

## 2017-06-03 ENCOUNTER — Other Ambulatory Visit: Payer: Self-pay | Admitting: Cardiology

## 2017-06-03 NOTE — Telephone Encounter (Signed)
°*  STAT* If patient is at the pharmacy, call can be transferred to refill team.   1. Which medications need to be refilled? (please list name of each medication and dose if known) Metoprolol Succinate ER 50 mg   2. Which pharmacy/location (including street and city if local pharmacy) is medication to be sent to?[place], [place]  3. Do they need a 30 day or 90 day supply? 90  Patient has an appt 07-09-17 @1 :40

## 2017-06-04 DIAGNOSIS — M1711 Unilateral primary osteoarthritis, right knee: Secondary | ICD-10-CM | POA: Diagnosis not present

## 2017-06-04 DIAGNOSIS — R262 Difficulty in walking, not elsewhere classified: Secondary | ICD-10-CM | POA: Diagnosis not present

## 2017-06-04 DIAGNOSIS — M25561 Pain in right knee: Secondary | ICD-10-CM | POA: Diagnosis not present

## 2017-06-04 MED ORDER — METOPROLOL SUCCINATE ER 50 MG PO TB24: 50.0000 mg | ORAL_TABLET | Freq: Every day | ORAL | 0 refills | Status: DC

## 2017-06-04 NOTE — Telephone Encounter (Signed)
Rx(s) sent to pharmacy electronically.  

## 2017-06-08 DIAGNOSIS — M1711 Unilateral primary osteoarthritis, right knee: Secondary | ICD-10-CM | POA: Diagnosis not present

## 2017-06-08 DIAGNOSIS — R262 Difficulty in walking, not elsewhere classified: Secondary | ICD-10-CM | POA: Diagnosis not present

## 2017-06-08 DIAGNOSIS — M25561 Pain in right knee: Secondary | ICD-10-CM | POA: Diagnosis not present

## 2017-06-11 DIAGNOSIS — R262 Difficulty in walking, not elsewhere classified: Secondary | ICD-10-CM | POA: Diagnosis not present

## 2017-06-11 DIAGNOSIS — M1711 Unilateral primary osteoarthritis, right knee: Secondary | ICD-10-CM | POA: Diagnosis not present

## 2017-06-11 DIAGNOSIS — M25561 Pain in right knee: Secondary | ICD-10-CM | POA: Diagnosis not present

## 2017-06-15 DIAGNOSIS — M25561 Pain in right knee: Secondary | ICD-10-CM | POA: Diagnosis not present

## 2017-06-15 DIAGNOSIS — R262 Difficulty in walking, not elsewhere classified: Secondary | ICD-10-CM | POA: Diagnosis not present

## 2017-06-15 DIAGNOSIS — M1711 Unilateral primary osteoarthritis, right knee: Secondary | ICD-10-CM | POA: Diagnosis not present

## 2017-06-19 ENCOUNTER — Other Ambulatory Visit: Payer: Self-pay | Admitting: Cardiology

## 2017-06-23 DIAGNOSIS — M25561 Pain in right knee: Secondary | ICD-10-CM | POA: Diagnosis not present

## 2017-06-23 DIAGNOSIS — R262 Difficulty in walking, not elsewhere classified: Secondary | ICD-10-CM | POA: Diagnosis not present

## 2017-06-23 DIAGNOSIS — M1711 Unilateral primary osteoarthritis, right knee: Secondary | ICD-10-CM | POA: Diagnosis not present

## 2017-06-29 DIAGNOSIS — R262 Difficulty in walking, not elsewhere classified: Secondary | ICD-10-CM | POA: Diagnosis not present

## 2017-06-29 DIAGNOSIS — M25561 Pain in right knee: Secondary | ICD-10-CM | POA: Diagnosis not present

## 2017-06-29 DIAGNOSIS — M1711 Unilateral primary osteoarthritis, right knee: Secondary | ICD-10-CM | POA: Diagnosis not present

## 2017-06-30 DIAGNOSIS — N959 Unspecified menopausal and perimenopausal disorder: Secondary | ICD-10-CM | POA: Diagnosis not present

## 2017-06-30 DIAGNOSIS — M545 Low back pain: Secondary | ICD-10-CM | POA: Diagnosis not present

## 2017-06-30 DIAGNOSIS — E1140 Type 2 diabetes mellitus with diabetic neuropathy, unspecified: Secondary | ICD-10-CM | POA: Diagnosis not present

## 2017-06-30 DIAGNOSIS — Z1231 Encounter for screening mammogram for malignant neoplasm of breast: Secondary | ICD-10-CM | POA: Diagnosis not present

## 2017-06-30 DIAGNOSIS — Z0000 Encounter for general adult medical examination without abnormal findings: Secondary | ICD-10-CM | POA: Diagnosis not present

## 2017-06-30 DIAGNOSIS — Z1331 Encounter for screening for depression: Secondary | ICD-10-CM | POA: Diagnosis not present

## 2017-06-30 DIAGNOSIS — Z139 Encounter for screening, unspecified: Secondary | ICD-10-CM | POA: Diagnosis not present

## 2017-06-30 DIAGNOSIS — E785 Hyperlipidemia, unspecified: Secondary | ICD-10-CM | POA: Diagnosis not present

## 2017-06-30 DIAGNOSIS — Z7901 Long term (current) use of anticoagulants: Secondary | ICD-10-CM | POA: Diagnosis not present

## 2017-06-30 DIAGNOSIS — R110 Nausea: Secondary | ICD-10-CM | POA: Diagnosis not present

## 2017-07-01 DIAGNOSIS — M1711 Unilateral primary osteoarthritis, right knee: Secondary | ICD-10-CM | POA: Diagnosis not present

## 2017-07-01 DIAGNOSIS — R262 Difficulty in walking, not elsewhere classified: Secondary | ICD-10-CM | POA: Diagnosis not present

## 2017-07-01 DIAGNOSIS — M25561 Pain in right knee: Secondary | ICD-10-CM | POA: Diagnosis not present

## 2017-07-06 DIAGNOSIS — M25561 Pain in right knee: Secondary | ICD-10-CM | POA: Diagnosis not present

## 2017-07-06 DIAGNOSIS — M1711 Unilateral primary osteoarthritis, right knee: Secondary | ICD-10-CM | POA: Diagnosis not present

## 2017-07-06 DIAGNOSIS — R262 Difficulty in walking, not elsewhere classified: Secondary | ICD-10-CM | POA: Diagnosis not present

## 2017-07-09 ENCOUNTER — Ambulatory Visit: Payer: Medicare Other | Admitting: Cardiology

## 2017-07-10 DIAGNOSIS — M1711 Unilateral primary osteoarthritis, right knee: Secondary | ICD-10-CM | POA: Diagnosis not present

## 2017-07-10 DIAGNOSIS — M25561 Pain in right knee: Secondary | ICD-10-CM | POA: Diagnosis not present

## 2017-07-10 DIAGNOSIS — R262 Difficulty in walking, not elsewhere classified: Secondary | ICD-10-CM | POA: Diagnosis not present

## 2017-07-13 DIAGNOSIS — M1711 Unilateral primary osteoarthritis, right knee: Secondary | ICD-10-CM | POA: Diagnosis not present

## 2017-07-13 DIAGNOSIS — M25561 Pain in right knee: Secondary | ICD-10-CM | POA: Diagnosis not present

## 2017-07-13 DIAGNOSIS — R262 Difficulty in walking, not elsewhere classified: Secondary | ICD-10-CM | POA: Diagnosis not present

## 2017-07-14 DIAGNOSIS — Z96651 Presence of right artificial knee joint: Secondary | ICD-10-CM | POA: Diagnosis not present

## 2017-07-14 DIAGNOSIS — M1711 Unilateral primary osteoarthritis, right knee: Secondary | ICD-10-CM | POA: Diagnosis not present

## 2017-07-14 DIAGNOSIS — M175 Other unilateral secondary osteoarthritis of knee: Secondary | ICD-10-CM | POA: Diagnosis not present

## 2017-07-14 DIAGNOSIS — Z96641 Presence of right artificial hip joint: Secondary | ICD-10-CM | POA: Diagnosis not present

## 2017-07-16 DIAGNOSIS — M1711 Unilateral primary osteoarthritis, right knee: Secondary | ICD-10-CM | POA: Diagnosis not present

## 2017-07-16 DIAGNOSIS — R262 Difficulty in walking, not elsewhere classified: Secondary | ICD-10-CM | POA: Diagnosis not present

## 2017-07-16 DIAGNOSIS — M25561 Pain in right knee: Secondary | ICD-10-CM | POA: Diagnosis not present

## 2017-07-20 DIAGNOSIS — M1711 Unilateral primary osteoarthritis, right knee: Secondary | ICD-10-CM | POA: Diagnosis not present

## 2017-07-20 DIAGNOSIS — R262 Difficulty in walking, not elsewhere classified: Secondary | ICD-10-CM | POA: Diagnosis not present

## 2017-07-20 DIAGNOSIS — M25561 Pain in right knee: Secondary | ICD-10-CM | POA: Diagnosis not present

## 2017-07-22 DIAGNOSIS — M1711 Unilateral primary osteoarthritis, right knee: Secondary | ICD-10-CM | POA: Diagnosis not present

## 2017-07-22 DIAGNOSIS — M25561 Pain in right knee: Secondary | ICD-10-CM | POA: Diagnosis not present

## 2017-07-22 DIAGNOSIS — R262 Difficulty in walking, not elsewhere classified: Secondary | ICD-10-CM | POA: Diagnosis not present

## 2017-07-27 DIAGNOSIS — Z1231 Encounter for screening mammogram for malignant neoplasm of breast: Secondary | ICD-10-CM | POA: Diagnosis not present

## 2017-07-27 DIAGNOSIS — M85852 Other specified disorders of bone density and structure, left thigh: Secondary | ICD-10-CM | POA: Diagnosis not present

## 2017-07-27 DIAGNOSIS — E2839 Other primary ovarian failure: Secondary | ICD-10-CM | POA: Diagnosis not present

## 2017-07-28 DIAGNOSIS — R262 Difficulty in walking, not elsewhere classified: Secondary | ICD-10-CM | POA: Diagnosis not present

## 2017-07-28 DIAGNOSIS — M1711 Unilateral primary osteoarthritis, right knee: Secondary | ICD-10-CM | POA: Diagnosis not present

## 2017-07-28 DIAGNOSIS — M25561 Pain in right knee: Secondary | ICD-10-CM | POA: Diagnosis not present

## 2017-07-30 DIAGNOSIS — M1711 Unilateral primary osteoarthritis, right knee: Secondary | ICD-10-CM | POA: Diagnosis not present

## 2017-07-30 DIAGNOSIS — M5135 Other intervertebral disc degeneration, thoracolumbar region: Secondary | ICD-10-CM | POA: Diagnosis not present

## 2017-07-30 DIAGNOSIS — M25561 Pain in right knee: Secondary | ICD-10-CM | POA: Diagnosis not present

## 2017-07-30 DIAGNOSIS — E041 Nontoxic single thyroid nodule: Secondary | ICD-10-CM | POA: Diagnosis not present

## 2017-07-30 DIAGNOSIS — Z7901 Long term (current) use of anticoagulants: Secondary | ICD-10-CM | POA: Diagnosis not present

## 2017-07-30 DIAGNOSIS — E559 Vitamin D deficiency, unspecified: Secondary | ICD-10-CM | POA: Diagnosis not present

## 2017-07-30 DIAGNOSIS — E1140 Type 2 diabetes mellitus with diabetic neuropathy, unspecified: Secondary | ICD-10-CM | POA: Diagnosis not present

## 2017-07-30 DIAGNOSIS — Z9181 History of falling: Secondary | ICD-10-CM | POA: Diagnosis not present

## 2017-07-30 DIAGNOSIS — R262 Difficulty in walking, not elsewhere classified: Secondary | ICD-10-CM | POA: Diagnosis not present

## 2017-07-30 DIAGNOSIS — Z79899 Other long term (current) drug therapy: Secondary | ICD-10-CM | POA: Diagnosis not present

## 2017-07-30 DIAGNOSIS — E782 Mixed hyperlipidemia: Secondary | ICD-10-CM | POA: Diagnosis not present

## 2017-08-05 DIAGNOSIS — M1711 Unilateral primary osteoarthritis, right knee: Secondary | ICD-10-CM | POA: Diagnosis not present

## 2017-08-05 DIAGNOSIS — M25561 Pain in right knee: Secondary | ICD-10-CM | POA: Diagnosis not present

## 2017-08-05 DIAGNOSIS — R262 Difficulty in walking, not elsewhere classified: Secondary | ICD-10-CM | POA: Diagnosis not present

## 2017-08-06 ENCOUNTER — Ambulatory Visit (INDEPENDENT_AMBULATORY_CARE_PROVIDER_SITE_OTHER): Payer: Medicare Other | Admitting: Cardiology

## 2017-08-06 ENCOUNTER — Encounter: Payer: Self-pay | Admitting: Cardiology

## 2017-08-06 VITALS — BP 90/60 | HR 94 | Ht 70.0 in | Wt 276.0 lb

## 2017-08-06 DIAGNOSIS — I878 Other specified disorders of veins: Secondary | ICD-10-CM | POA: Diagnosis not present

## 2017-08-06 DIAGNOSIS — I10 Essential (primary) hypertension: Secondary | ICD-10-CM | POA: Diagnosis not present

## 2017-08-06 DIAGNOSIS — I5032 Chronic diastolic (congestive) heart failure: Secondary | ICD-10-CM

## 2017-08-06 DIAGNOSIS — I482 Chronic atrial fibrillation, unspecified: Secondary | ICD-10-CM

## 2017-08-06 DIAGNOSIS — Z01810 Encounter for preprocedural cardiovascular examination: Secondary | ICD-10-CM | POA: Diagnosis not present

## 2017-08-06 MED ORDER — METOLAZONE 10 MG PO TABS: 10.0000 mg | ORAL_TABLET | ORAL | 3 refills | Status: DC | PRN

## 2017-08-06 MED ORDER — FUROSEMIDE 80 MG PO TABS: 80.0000 mg | ORAL_TABLET | Freq: Two times a day (BID) | ORAL | 3 refills | Status: DC

## 2017-08-06 MED ORDER — METOPROLOL SUCCINATE ER 50 MG PO TB24: 50.0000 mg | ORAL_TABLET | Freq: Every day | ORAL | 3 refills | Status: DC

## 2017-08-06 NOTE — Patient Instructions (Addendum)
MEDICATION INSTRUCTIONS  TAKE METOLAZONE  10 MG  ONE  DAY A WEEK  30 MIN BEFORE 80 MG FUROSEMIDE  MORNING DOSE  AND THEN IF NEEDED FOR WEIGHT GAIN OF 3 LBS OR MORE.  STOP LISINOPRIL    Your physician wants you to follow-up in [place].You will receive a reminder letter in the mail two months in advance. If you don't receive a letter, please call our office to schedule the follow-up appointment.   If you need a refill on your cardiac medications before your next appointment, please call your pharmacy.

## 2017-08-06 NOTE — Progress Notes (Signed)
PCP: Guadlupe Spanish, MD  Clinic Note: Chief Complaint  Patient presents with  . Follow-up    7 months  . Headache  . Edema    HPI: Donna [name] is a 67 y.o. female with a PMH below who presents today for annual follow-up for long-standing chronic persistent A. Fib, chronic LE Edema (probably more related to OSA/OHS than HFpEF).  She is now on her 59th Cardiologist --she has been given a diagnosis of congestive heart failure despite having relatively normal echocardiograms and cardiac catheterization showing no ischemia and relatively normal cardiac pressures.  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 -->  There is a lot of confusion as she is taking her diuretics.  I simply have her take 80 mg twice daily furosemide with metolazone prior to the first dose and then adjusted her sliding scale.   --Since I met her, she has been wheelchair-bound troubled by bilateral knee arthritis.  She also is deconditioned and obese.  She actually had knee surgery in December for her right knee total replacement and has now hope for her left to be done soon.  Donna [name] was last seen on Jul 25, 2016 --> she has a smile on her face.  Almost in tears because she was still having her knee surgery coming up.  Her edema had notably improved as had her dizzy this and dyspnea.  She was otherwise relatively asymptomatic despite being in persistent  Recent Hospitalizations:   Right knee [facility]) December 2018  Studies Personally Reviewed - (if available, images/films reviewed: From Epic Chart or Care Everywhere)  None  Interval History: Donna [name] presents today again with a smile on her face.  She is actually now able to walk despite having significant left knee bowing.  She is very excited about how well she is healed from her right knee surgery and is excited about the left knee.  One thing  she is noticing now that was that she is been quite dizzy and woozy when she stands up.  She does have some balance issues and some fatigue.  She says her edema however totally well controlled with a combination of support stockings and Lasix dosing.  She rarely has to use additional diuretic and is only had to use her Zaroxolyn once since January after having use it a several times postop December.  Probably more because of body habitus she has some orthopnea and sleeps in the chair.  But uses CPAP therefore does not have any real PND. She does not have any chest tightness or pressure with rest or exertion, but if she tries to exert herself she will get short of breath. She does not notice being in A. fib without any sensation of irregular heartbeat --> the only thing she notes it is that she is very anxious or stressed, her sugar is high, or she walks longer than she should..  Besides feeling dizzy, she has not had any true syncope or near syncope.  No TIA or amaurosis fugax.  No claudication.  ROS: A comprehensive was performed. Review of Systems  Constitutional: Negative for weight loss.  Respiratory: Positive for shortness of breath (Pretty much at baseline improved).   Cardiovascular: Positive for leg swelling (Pretty well controlled.  Stasis dermatitis is improved.).  Gastrointestinal: Negative for blood in stool and melena.  Genitourinary: Negative for dysuria and hematuria.  Has a hard time controlling her bladder -especially because she is on diuretic.   Musculoskeletal: Positive for joint pain (Hoping to get t left knee hat done soon).  Neurological: Positive for dizziness. Negative for focal weakness.  Psychiatric/Behavioral: Negative for depression. The patient is nervous/anxious (Well-controlled).   All other systems reviewed and are negative.   I have reviewed and (if needed) personally updated the patient's problem list, medications, allergies, past medical and surgical  history, social and family history.   Past Medical History:  Diagnosis Date  . Chronic /permanent atrial fibrillation ([place]) 2005   On Warfarin (follwed @ [facility]); Rate controlled - On BB  . Chronic diastolic heart failure, NYHA class 2 ([place])    Updated by A. fib, hypertensive heart disease and obesity; EDP was only 7 by cardiac catheterization in September 2017  . Diabetes mellitus without complication ([place])   . Diabetic lumbosacral plexopathy ([place])   . Fibromyalgia   . Generalized anxiety disorder   . Incontinence   . Major depressive disorder   . Restless leg syndrome     Past Surgical History:  Procedure Laterality Date  . ABDOMINAL HYSTERECTOMY    . CARDIAC CATHETERIZATION  11/09/2015   [facility]: Nonocclusive CAD. EF 55%. EDP 7 mmHg.  Marland Kitchen CHOLECYSTECTOMY    . HIP SURGERY Right    x2  . KNEE ARTHROSCOPY Right   . MASS EXCISION     sarcoma - shoulder  . ROTATOR CUFF REPAIR Bilateral   . TONSILLECTOMY    . TRANSTHORACIC ECHOCARDIOGRAM  11/2013; 02/2016   a[facility]: with Def[facility]:  EF 60-65%. Mod LA dilation. Mild AoV sclerosis. Normal RHP. Indeterminate LV filling pressures. b) [facility]: Normal cavity size. EF 55-60%.no RWMA, mild AoV sclerosis (R cusp), Mod MAC. Mild biAtrial dilation.    Current Meds  Medication Sig  . acetaminophen (TYLENOL) 500 MG tablet Take 500 mg by mouth every 6 (six) hours as needed.  . ALPRAZolam (XANAX) 0.25 MG tablet Take 0.25 mg by mouth 3 (three) times daily as needed for sleep.  Marland Kitchen aspirin 81 MG tablet Take 81 mg by mouth daily.  . busPIRone (BUSPAR) 5 MG tablet Take 10 mg by mouth 2 (two) times daily.   . Cholecalciferol (VITAMIN D) 400 UNITS capsule Take 800 Units by mouth daily.  Marland Kitchen desvenlafaxine (PRISTIQ) 100 MG 24 hr tablet Take 100 mg by mouth daily.  . Dulaglutide (TRULICITY [place]) Inject 2 mg into the skin once a week.  . ezetimibe (ZETIA) 10 MG tablet Take 10 mg by mouth daily.  . ferrous sulfate  325 (65 FE) MG tablet Take 325 mg by mouth 2 (two) times daily.  . furosemide (LASIX) 80 MG tablet Take 1 tablet (80 mg total) by mouth 2 (two) times daily.  Marland Kitchen gabapentin (NEURONTIN) 600 MG tablet Take 600 mg by mouth 3 (three) times daily.  Marland Kitchen guaiFENesin (MUCINEX) 600 MG 12 hr tablet Take by mouth 2 (two) times daily.  . insulin detemir (LEVEMIR) 100 UNIT/ML injection Inject 42 Units into the skin at bedtime.  . insulin lispro (HUMALOG) 100 UNIT/ML injection Inject into the skin 3 (three) times daily before meals. Sliding scale  . Insulin Pen Needle (NOVOTWIST) 32G X 5 MM MISC by Does not apply route.  . lidocaine (LIDODERM) 5 % Place 1 patch onto the skin daily. Remove & Discard patch within 12 hours or as directed by MD  . magnesium oxide (MAG-OX) 400 MG tablet Take 400 mg  by mouth 4 (four) times daily.  . Melatonin 300 MCG TABS Take 1 tablet by mouth at bedtime as needed.  . metolazone (ZAROXOLYN) 10 MG tablet Take 1 tablet (10 mg total) by mouth as needed (for swelling).  . metoprolol succinate (TOPROL-XL) 50 MG 24 hr tablet Take 1 tablet (50 mg total) by mouth daily. Take with or immediately following a meal.  . Multiple Vitamin (MULTIVITAMIN) tablet Take 1 tablet by mouth daily.  . Multiple Vitamins-Minerals (MULTIVITAMIN WITH MINERALS) tablet Take 1 tablet by mouth daily.  . OxyCODONE HCl, Abuse Deter, 5 MG TABA Take 1 tablet by mouth 3 (three) times daily.  . potassium chloride SA (K-DUR,KLOR-CON) 20 MEQ tablet Take 2 tablets 40 meq twice a day  . pramipexole (MIRAPEX) 0.125 MG tablet Take 0.125 mg by mouth daily.  . sertraline (ZOLOFT) 100 MG tablet Take 100 mg by mouth daily.  Marland Kitchen sulfamethoxazole-trimethoprim (BACTRIM,SEPTRA) 400-80 MG tablet Take 1 tablet by mouth 2 (two) times daily.  . temazepam (RESTORIL) 30 MG capsule Take 30 mg by mouth at bedtime as needed for sleep.  . Turmeric 500 MG CAPS Take by mouth.  . warfarin (COUMADIN) 10 MG tablet Take 10 mg by mouth. Monday Wednesday  Friday Sunday  . warfarin (COUMADIN) 7.5 MG tablet Take 7.5 mg by mouth. Tuesday, Thursday, Saturday  . [DISCONTINUED] furosemide (LASIX) 80 MG tablet Take 1 tablet (80 mg total) by mouth 2 (two) times daily.  . [DISCONTINUED] lisinopril (PRINIVIL,ZESTRIL) 2.5 MG tablet TAKE 1 TABLET BY MOUTH EVERY DAY  . [DISCONTINUED] metolazone (ZAROXOLYN) 10 MG tablet TAKE 1 TABLET (10 MG TOTAL) BY MOUTH AS NEEDED (FOR SWELLING).  . [DISCONTINUED] metoprolol succinate (TOPROL-XL) 50 MG 24 hr tablet Take 1 tablet (50 mg total) by mouth daily. Take with or immediately following a meal.    Allergies  Allergen Reactions  . Bee Venom Hives    Takes benadryl  . Fentanyl Other (See Comments)  . Metformin Diarrhea  . Metformin And Related Diarrhea  . Metformin Hcl Diarrhea  . Other Other (See Comments)    Raw foods with seeds give her "boils".  Avoids raw strawberries, blueberries. Tolerates cooked fruits, tomato sauce, bread/grains with seeds. [facility] 10/17/13 Berries with seeds  . Alteplase Rash  . Cefepime Rash    Social History   Tobacco Use  . Smoking status: Never Smoker  . Smokeless tobacco: Never Used  Substance Use Topics  . Alcohol use: No  . Drug use: No   Social History   Social History Narrative  . Not on file    family history includes Heart disease in her mother; Hypertension in her mother; Leukemia in her son.  Wt Readings from Last 3 Encounters:  08/06/17 276 lb (125.2 kg)  07/25/16 279 lb 6.4 oz (126.7 kg)  03/26/16 289 lb (131.1 kg)    PHYSICAL EXAM BP 90/60 (BP Location: Left Arm, Patient Position: Sitting, Cuff Size: Large)   Pulse 94   Ht _0  (1.778 m)   Wt 276 lb (125.2 kg)   BMI 39.60 kg/m  Physical Exam  Constitutional: She is oriented to person, place, and time. No distress.  Morbidly obese woman.  Sitting in wheelchair.  Relatively well-groomed however she does have an odor of urine due to some incontinence.  HENT:  Head: Normocephalic and atraumatic.    Neck: Normal range of motion. Neck supple. No hepatojugular reflux (Unable to assess due to body habitus.) and no JVD present. Carotid bruit is not present.  Cardiovascular: Normal rate and normal heart sounds. An irregularly irregular rhythm present. PMI is not displaced (Unable to palpate). Exam reveals decreased pulses (Palpable pedal). Exam reveals no gallop, no S4 and no friction rub.  Pulmonary/Chest: Effort normal. No respiratory distress. She exhibits tenderness (Some costochondral tenderness).  Somewhat distant breath sounds with mild interstitial sounds but no wheezes rales or rhonchi.  Abdominal: Soft. Bowel sounds are normal. She exhibits no distension. There is no tenderness. There is no rebound.  Obese.  Unable to assess HSM  Musculoskeletal: Normal range of motion. She exhibits edema (Well-controlled maybe 1+ to trace bilateral LE  edema).  Neurological: She is alert and oriented to person, place, and time.  Skin:  Mild venous stasis changes in bilateral legs.  Psychiatric: She has a normal mood and affect. Her behavior is normal. Judgment and thought content normal.    Adult ECG Report  Rate: 94 ;  Rhythm: atrial fibrillation and I question the placement.  There is now some ST-T wave changes that are different but likely due to the EKG being done sitting up.;   Narrative Interpretation: Still relatively normal EKG.   Other studies Reviewed: Additional studies/ records that were reviewed today include:  Recent Labs:    Jul 30, 2017: TC 182, TG 106, LDL 113 HDL 48.  A1c 7.3.  BUN/CR 17/1.06.  TSH 0.91.  ASSESSMENT / PLAN: Problem List Items Addressed This Visit    Venous stasis of both lower extremities (Chronic)    Overall doing fairly good.  Continue support stockings.  Stay on current dose of furosemide with PRN metolazone.  Seems to be requiring less and less diuretic.  Recommend - take Metolazone at least 1 day / week pre-furosemide & otherwise PRN -- in order to  avoid "getting behind"      Relevant Medications   metolazone (ZAROXOLYN) 10 MG tablet   furosemide (LASIX) 80 MG tablet   metoprolol succinate (TOPROL-XL) 50 MG 24 hr tablet   Other Relevant Orders   EKG 12-Lead (Completed)   Pre-operative cardiovascular examination    She did well with her  right knee surgery.  She should be 5 her left.  I still would not do any further cardiac evaluation.  Continue beta-blocker.  Her warfarin is being managed by her PCP.  Would be okay to hold for surgery. No further evaluation as she would be a low risk patient from a cardiac standpoint or a low risk noncardiac surgery.      Morbid obesity ([place]) (Chronic)    Has plateaued with weight loss now, hopefully once her knee is done she will be get some exercise.  Again talked importance of dietary discretion.      Essential hypertension (Chronic)    Not as you today.  DC ACE inhibitor.      Relevant Medications   metolazone (ZAROXOLYN) 10 MG tablet   furosemide (LASIX) 80 MG tablet   metoprolol succinate (TOPROL-XL) 50 MG 24 hr tablet   Chronic diastolic heart failure (HCC) (Chronic)    Borderline diagnosis.  Mostly venous stasis and OHS related. Blood pressure will not tolerate afterload reduction.  Stop ACE inhibitor  Lately.  We will continue Toprol for rate control but avoid blood pressure medications.  Continue standing dose of Lasix       Relevant Medications   metolazone (ZAROXOLYN) 10 MG tablet   furosemide (LASIX) 80 MG tablet   metoprolol succinate (TOPROL-XL) 50 MG 24 hr tablet   Chronic atrial fibrillation ([place]):  CHA2DS2-VASc Score 5; on Warfarin - Primary (Chronic)   Relevant Medications   metolazone (ZAROXOLYN) 10 MG tablet   furosemide (LASIX) 80 MG tablet   metoprolol succinate (TOPROL-XL) 50 MG 24 hr tablet   Other Relevant Orders   EKG 12-Lead (Completed)      Current medicines are reviewed at length with the patient today.  (+/- concerns) n/a The following changes have  been made:  n/a  Patient Instructions  MEDICATION INSTRUCTIONS  TAKE METOLAZONE  10 MG  ONE  DAY A WEEK  30 MIN BEFORE 80 MG FUROSEMIDE  MORNING DOSE  AND THEN IF NEEDED FOR WEIGHT GAIN OF 3 LBS OR MORE.  STOP LISINOPRIL    Your physician wants you to follow-up in [place].You will receive a reminder letter in the mail two months in advance. If you don't receive a letter, please call our office to schedule the follow-up appointment.   If you need a refill on your cardiac medications before your next appointment, please call your pharmacy.      Studies Ordered:   Orders Placed This Encounter  Procedures  . EKG 12-Lead      Glenetta Hew, M.D., M.S. Interventional Cardiologist   Pager # [phone] Phone # [phone] [address] [place], [place] [address]   Thank you for choosing [facility] at [facility]!!

## 2017-08-09 ENCOUNTER — Encounter: Payer: Self-pay | Admitting: Cardiology

## 2017-08-09 NOTE — Assessment & Plan Note (Signed)
She did well with her  right knee surgery.  She should be 5 her left.  I still would not do any further cardiac evaluation.  Continue beta-blocker.  Her warfarin is being managed by her PCP.  Would be okay to hold for surgery. No further evaluation as she would be a low risk patient from a cardiac standpoint or a low risk noncardiac surgery.

## 2017-08-09 NOTE — Assessment & Plan Note (Addendum)
Overall doing fairly good.  Continue support stockings.  Stay on current dose of furosemide with PRN metolazone.  Seems to be requiring less and less diuretic.  Recommend - take Metolazone at least 1 day / week pre-furosemide & otherwise PRN -- in order to avoid "getting behind"

## 2017-08-09 NOTE — Assessment & Plan Note (Addendum)
Not as you today.  DC ACE inhibitor.

## 2017-08-09 NOTE — Assessment & Plan Note (Addendum)
Borderline diagnosis.  Mostly venous stasis and OHS related. Blood pressure will not tolerate afterload reduction.  Stop ACE inhibitor  Lately.  We will continue Toprol for rate control but avoid blood pressure medications.  Continue standing dose of Lasix

## 2017-08-09 NOTE — Assessment & Plan Note (Signed)
Has plateaued with weight loss now, hopefully once her knee is done she will be get some exercise.  Again talked importance of dietary discretion.

## 2017-08-10 DIAGNOSIS — M1711 Unilateral primary osteoarthritis, right knee: Secondary | ICD-10-CM | POA: Diagnosis not present

## 2017-08-10 DIAGNOSIS — M25561 Pain in right knee: Secondary | ICD-10-CM | POA: Diagnosis not present

## 2017-08-10 DIAGNOSIS — R262 Difficulty in walking, not elsewhere classified: Secondary | ICD-10-CM | POA: Diagnosis not present

## 2017-08-13 ENCOUNTER — Telehealth: Payer: Self-pay | Admitting: Cardiology

## 2017-08-13 DIAGNOSIS — R42 Dizziness and giddiness: Secondary | ICD-10-CM | POA: Diagnosis not present

## 2017-08-13 DIAGNOSIS — I10 Essential (primary) hypertension: Secondary | ICD-10-CM | POA: Diagnosis not present

## 2017-08-13 DIAGNOSIS — R197 Diarrhea, unspecified: Secondary | ICD-10-CM | POA: Diagnosis not present

## 2017-08-13 DIAGNOSIS — E1140 Type 2 diabetes mellitus with diabetic neuropathy, unspecified: Secondary | ICD-10-CM | POA: Diagnosis not present

## 2017-08-13 DIAGNOSIS — Z79899 Other long term (current) drug therapy: Secondary | ICD-10-CM | POA: Diagnosis not present

## 2017-08-13 NOTE — Telephone Encounter (Signed)
Spoke with the patient. She stated that since she has been taken off of Lisinopril that she has been having black out spells when she is standing. She recently fell during one of these episodes. This began last Friday. She stated that her blood pressure has been running around 130/90's. She has been staying hydrated and stated that nothing has changed except for discontinuing the Lisinopril. She has been advised to to call her PCP to report her symptoms and get an appointment. She requested that Dr. Ellyn Hack be made aware in case this has to do with the Lisinopril. Message routed to the provider and pharmd for further recommendation.

## 2017-08-13 NOTE — Telephone Encounter (Signed)
Call placed to the patient. Unable to leave a message.  

## 2017-08-13 NOTE — Telephone Encounter (Signed)
New Message:       Pt c/o medication issue:  1. Name of Medication: Lisinopril  2. How are you currently taking this medication (dosage and times per day)?   3. Are you having a reaction (difficulty breathing--STAT)?   4. What is your medication issue? Pt states she wants to make sure she was just supposed to stop completely with this medication and not come of it this medication slowly

## 2017-08-14 DIAGNOSIS — R197 Diarrhea, unspecified: Secondary | ICD-10-CM | POA: Diagnosis not present

## 2017-08-14 NOTE — Telephone Encounter (Signed)
The logic behind that does not make much sense, but if she wants to restart at half a dose of lisinopril see if it recurs, then fine.  Glenetta Hew, MD

## 2017-08-17 DIAGNOSIS — R262 Difficulty in walking, not elsewhere classified: Secondary | ICD-10-CM | POA: Diagnosis not present

## 2017-08-17 DIAGNOSIS — M25561 Pain in right knee: Secondary | ICD-10-CM | POA: Diagnosis not present

## 2017-08-17 DIAGNOSIS — M1711 Unilateral primary osteoarthritis, right knee: Secondary | ICD-10-CM | POA: Diagnosis not present

## 2017-08-17 NOTE — Telephone Encounter (Signed)
Called returned to the patient. There was not any answer nor voicemail.

## 2017-08-19 NOTE — Telephone Encounter (Signed)
Returned the call to the patient. She stated that she has seen her PCP and they are running lab work. She will call the office back if there is anything further we can do for her.

## 2017-08-20 DIAGNOSIS — H40033 Anatomical narrow angle, bilateral: Secondary | ICD-10-CM | POA: Diagnosis not present

## 2017-08-26 DIAGNOSIS — R197 Diarrhea, unspecified: Secondary | ICD-10-CM | POA: Diagnosis not present

## 2017-08-26 DIAGNOSIS — R42 Dizziness and giddiness: Secondary | ICD-10-CM | POA: Diagnosis not present

## 2017-08-26 DIAGNOSIS — E1140 Type 2 diabetes mellitus with diabetic neuropathy, unspecified: Secondary | ICD-10-CM | POA: Diagnosis not present

## 2017-08-26 DIAGNOSIS — Z7901 Long term (current) use of anticoagulants: Secondary | ICD-10-CM | POA: Diagnosis not present

## 2017-08-31 HISTORY — PX: TRANSTHORACIC ECHOCARDIOGRAM: SHX275

## 2017-09-03 ENCOUNTER — Other Ambulatory Visit: Payer: Self-pay

## 2017-09-03 ENCOUNTER — Inpatient Hospital Stay (HOSPITAL_COMMUNITY): Payer: Medicare Other

## 2017-09-03 ENCOUNTER — Emergency Department (HOSPITAL_COMMUNITY): Payer: Medicare Other

## 2017-09-03 ENCOUNTER — Encounter (HOSPITAL_COMMUNITY): Payer: Self-pay | Admitting: Emergency Medicine

## 2017-09-03 ENCOUNTER — Inpatient Hospital Stay (HOSPITAL_COMMUNITY)
Admission: EM | Admit: 2017-09-03 | Discharge: 2017-09-07 | DRG: 605 | Disposition: A | Discharge status: Skilled Nursing Facility | Payer: Medicare Other | Attending: Internal Medicine | Admitting: Internal Medicine

## 2017-09-03 DIAGNOSIS — S76011A Strain of muscle, fascia and tendon of right hip, initial encounter: Secondary | ICD-10-CM | POA: Diagnosis not present

## 2017-09-03 DIAGNOSIS — I4821 Permanent atrial fibrillation: Secondary | ICD-10-CM | POA: Diagnosis present

## 2017-09-03 DIAGNOSIS — S76019A Strain of muscle, fascia and tendon of unspecified hip, initial encounter: Secondary | ICD-10-CM | POA: Diagnosis not present

## 2017-09-03 DIAGNOSIS — M797 Fibromyalgia: Secondary | ICD-10-CM | POA: Diagnosis not present

## 2017-09-03 DIAGNOSIS — W1830XA Fall on same level, unspecified, initial encounter: Secondary | ICD-10-CM | POA: Diagnosis present

## 2017-09-03 DIAGNOSIS — Z79891 Long term (current) use of opiate analgesic: Secondary | ICD-10-CM

## 2017-09-03 DIAGNOSIS — R278 Other lack of coordination: Secondary | ICD-10-CM | POA: Diagnosis not present

## 2017-09-03 DIAGNOSIS — R739 Hyperglycemia, unspecified: Secondary | ICD-10-CM | POA: Diagnosis not present

## 2017-09-03 DIAGNOSIS — E876 Hypokalemia: Secondary | ICD-10-CM | POA: Diagnosis not present

## 2017-09-03 DIAGNOSIS — F419 Anxiety disorder, unspecified: Secondary | ICD-10-CM | POA: Diagnosis not present

## 2017-09-03 DIAGNOSIS — E785 Hyperlipidemia, unspecified: Secondary | ICD-10-CM | POA: Diagnosis present

## 2017-09-03 DIAGNOSIS — Z743 Need for continuous supervision: Secondary | ICD-10-CM | POA: Diagnosis not present

## 2017-09-03 DIAGNOSIS — T502X5A Adverse effect of carbonic-anhydrase inhibitors, benzothiadiazides and other diuretics, initial encounter: Secondary | ICD-10-CM | POA: Diagnosis present

## 2017-09-03 DIAGNOSIS — H4020X0 Unspecified primary angle-closure glaucoma, stage unspecified: Secondary | ICD-10-CM | POA: Diagnosis present

## 2017-09-03 DIAGNOSIS — Y92009 Unspecified place in unspecified non-institutional (private) residence as the place of occurrence of the external cause: Secondary | ICD-10-CM

## 2017-09-03 DIAGNOSIS — Z79899 Other long term (current) drug therapy: Secondary | ICD-10-CM

## 2017-09-03 DIAGNOSIS — E1140 Type 2 diabetes mellitus with diabetic neuropathy, unspecified: Secondary | ICD-10-CM | POA: Diagnosis present

## 2017-09-03 DIAGNOSIS — M1990 Unspecified osteoarthritis, unspecified site: Secondary | ICD-10-CM | POA: Diagnosis present

## 2017-09-03 DIAGNOSIS — G4733 Obstructive sleep apnea (adult) (pediatric): Secondary | ICD-10-CM | POA: Diagnosis present

## 2017-09-03 DIAGNOSIS — R32 Unspecified urinary incontinence: Secondary | ICD-10-CM | POA: Diagnosis present

## 2017-09-03 DIAGNOSIS — Z8249 Family history of ischemic heart disease and other diseases of the circulatory system: Secondary | ICD-10-CM

## 2017-09-03 DIAGNOSIS — S8002XA Contusion of left knee, initial encounter: Principal | ICD-10-CM | POA: Diagnosis present

## 2017-09-03 DIAGNOSIS — F411 Generalized anxiety disorder: Secondary | ICD-10-CM | POA: Diagnosis not present

## 2017-09-03 DIAGNOSIS — I878 Other specified disorders of veins: Secondary | ICD-10-CM | POA: Diagnosis present

## 2017-09-03 DIAGNOSIS — Z91030 Bee allergy status: Secondary | ICD-10-CM

## 2017-09-03 DIAGNOSIS — G2581 Restless legs syndrome: Secondary | ICD-10-CM | POA: Diagnosis present

## 2017-09-03 DIAGNOSIS — M6281 Muscle weakness (generalized): Secondary | ICD-10-CM | POA: Diagnosis not present

## 2017-09-03 DIAGNOSIS — E860 Dehydration: Secondary | ICD-10-CM | POA: Diagnosis not present

## 2017-09-03 DIAGNOSIS — R55 Syncope and collapse: Secondary | ICD-10-CM | POA: Diagnosis not present

## 2017-09-03 DIAGNOSIS — Z7982 Long term (current) use of aspirin: Secondary | ICD-10-CM

## 2017-09-03 DIAGNOSIS — S8992XA Unspecified injury of left lower leg, initial encounter: Secondary | ICD-10-CM | POA: Diagnosis not present

## 2017-09-03 DIAGNOSIS — Z888 Allergy status to other drugs, medicaments and biological substances status: Secondary | ICD-10-CM

## 2017-09-03 DIAGNOSIS — M25551 Pain in right hip: Secondary | ICD-10-CM | POA: Diagnosis not present

## 2017-09-03 DIAGNOSIS — I5032 Chronic diastolic (congestive) heart failure: Secondary | ICD-10-CM | POA: Diagnosis present

## 2017-09-03 DIAGNOSIS — W19XXXA Unspecified fall, initial encounter: Secondary | ICD-10-CM

## 2017-09-03 DIAGNOSIS — F329 Major depressive disorder, single episode, unspecified: Secondary | ICD-10-CM | POA: Diagnosis present

## 2017-09-03 DIAGNOSIS — I110 Hypertensive heart disease with heart failure: Secondary | ICD-10-CM | POA: Diagnosis present

## 2017-09-03 DIAGNOSIS — I4891 Unspecified atrial fibrillation: Secondary | ICD-10-CM | POA: Diagnosis not present

## 2017-09-03 DIAGNOSIS — R2681 Unsteadiness on feet: Secondary | ICD-10-CM | POA: Diagnosis not present

## 2017-09-03 DIAGNOSIS — I2510 Atherosclerotic heart disease of native coronary artery without angina pectoris: Secondary | ICD-10-CM | POA: Diagnosis present

## 2017-09-03 DIAGNOSIS — S79911A Unspecified injury of right hip, initial encounter: Secondary | ICD-10-CM | POA: Diagnosis not present

## 2017-09-03 DIAGNOSIS — Z7901 Long term (current) use of anticoagulants: Secondary | ICD-10-CM | POA: Diagnosis not present

## 2017-09-03 DIAGNOSIS — I10 Essential (primary) hypertension: Secondary | ICD-10-CM | POA: Diagnosis present

## 2017-09-03 DIAGNOSIS — R159 Full incontinence of feces: Secondary | ICD-10-CM | POA: Diagnosis present

## 2017-09-03 DIAGNOSIS — Z6839 Body mass index (BMI) 39.0-39.9, adult: Secondary | ICD-10-CM

## 2017-09-03 DIAGNOSIS — R52 Pain, unspecified: Secondary | ICD-10-CM

## 2017-09-03 DIAGNOSIS — I482 Chronic atrial fibrillation, unspecified: Secondary | ICD-10-CM | POA: Diagnosis present

## 2017-09-03 DIAGNOSIS — Z87898 Personal history of other specified conditions: Secondary | ICD-10-CM | POA: Diagnosis not present

## 2017-09-03 DIAGNOSIS — E1165 Type 2 diabetes mellitus with hyperglycemia: Secondary | ICD-10-CM | POA: Diagnosis not present

## 2017-09-03 DIAGNOSIS — F418 Other specified anxiety disorders: Secondary | ICD-10-CM | POA: Diagnosis present

## 2017-09-03 DIAGNOSIS — H40033 Anatomical narrow angle, bilateral: Secondary | ICD-10-CM | POA: Diagnosis present

## 2017-09-03 DIAGNOSIS — S3993XA Unspecified injury of pelvis, initial encounter: Secondary | ICD-10-CM | POA: Diagnosis not present

## 2017-09-03 DIAGNOSIS — R531 Weakness: Secondary | ICD-10-CM | POA: Diagnosis not present

## 2017-09-03 DIAGNOSIS — R41841 Cognitive communication deficit: Secondary | ICD-10-CM | POA: Diagnosis not present

## 2017-09-03 DIAGNOSIS — I351 Nonrheumatic aortic (valve) insufficiency: Secondary | ICD-10-CM

## 2017-09-03 DIAGNOSIS — M25562 Pain in left knee: Secondary | ICD-10-CM | POA: Diagnosis not present

## 2017-09-03 DIAGNOSIS — R197 Diarrhea, unspecified: Secondary | ICD-10-CM | POA: Diagnosis not present

## 2017-09-03 DIAGNOSIS — R279 Unspecified lack of coordination: Secondary | ICD-10-CM | POA: Diagnosis not present

## 2017-09-03 DIAGNOSIS — K5732 Diverticulitis of large intestine without perforation or abscess without bleeding: Secondary | ICD-10-CM | POA: Diagnosis not present

## 2017-09-03 DIAGNOSIS — Z794 Long term (current) use of insulin: Secondary | ICD-10-CM

## 2017-09-03 DIAGNOSIS — S299XXA Unspecified injury of thorax, initial encounter: Secondary | ICD-10-CM | POA: Diagnosis not present

## 2017-09-03 HISTORY — DX: Syncope and collapse: R55

## 2017-09-03 HISTORY — DX: Hypokalemia: E87.6

## 2017-09-03 LAB — URINALYSIS, ROUTINE W REFLEX MICROSCOPIC
Bilirubin Urine: NEGATIVE
Glucose, UA: 150 mg/dL — AB
KETONES UR: NEGATIVE mg/dL
Leukocytes, UA: NEGATIVE
Nitrite: NEGATIVE
PH: 6 (ref 5.0–8.0)
PROTEIN: NEGATIVE mg/dL
Specific Gravity, Urine: 1.014 (ref 1.005–1.030)

## 2017-09-03 LAB — BASIC METABOLIC PANEL
ANION GAP: 17 — AB (ref 5–15)
ANION GAP: 12 (ref 5–15)
ANION GAP: 11 (ref 5–15)
Anion gap: 15 (ref 5–15)
BUN: 36 mg/dL — AB (ref 8–23)
BUN: 32 mg/dL — ABNORMAL HIGH (ref 8–23)
BUN: 30 mg/dL — ABNORMAL HIGH (ref 8–23)
BUN: 25 mg/dL — ABNORMAL HIGH (ref 8–23)
CALCIUM: 9.4 mg/dL (ref 8.9–10.3)
CALCIUM: 8.4 mg/dL — AB (ref 8.9–10.3)
CALCIUM: 8.6 mg/dL — AB (ref 8.9–10.3)
CALCIUM: 8.4 mg/dL — AB (ref 8.9–10.3)
CHLORIDE: 90 mmol/L — AB (ref 98–111)
CO2: 32 mmol/L (ref 22–32)
CO2: 29 mmol/L (ref 22–32)
CO2: 32 mmol/L (ref 22–32)
CO2: 29 mmol/L (ref 22–32)
Chloride: 84 mmol/L — ABNORMAL LOW (ref 98–111)
Chloride: 90 mmol/L — ABNORMAL LOW (ref 98–111)
Chloride: 92 mmol/L — ABNORMAL LOW (ref 98–111)
Creatinine, Ser: 1.66 mg/dL — ABNORMAL HIGH (ref 0.44–1.00)
Creatinine, Ser: 1.32 mg/dL — ABNORMAL HIGH (ref 0.44–1.00)
Creatinine, Ser: 1.28 mg/dL — ABNORMAL HIGH (ref 0.44–1.00)
Creatinine, Ser: 1.14 mg/dL — ABNORMAL HIGH (ref 0.44–1.00)
GFR calc Af Amer: 36 mL/min — ABNORMAL LOW (ref >60)
GFR calc Af Amer: 48 mL/min — ABNORMAL LOW (ref >60)
GFR calc non Af Amer: 31 mL/min — ABNORMAL LOW (ref >60)
GFR calc non Af Amer: 41 mL/min — ABNORMAL LOW (ref >60)
GFR, EST AFRICAN AMERICAN: 49 mL/min — AB (ref >60)
GFR, EST AFRICAN AMERICAN: 57 mL/min — AB (ref >60)
GFR, EST NON AFRICAN AMERICAN: 43 mL/min — AB (ref >60)
GFR, EST NON AFRICAN AMERICAN: 49 mL/min — AB (ref >60)
GLUCOSE: 389 mg/dL — AB (ref 70–99)
Glucose, Bld: 312 mg/dL — ABNORMAL HIGH (ref 70–99)
Glucose, Bld: 242 mg/dL — ABNORMAL HIGH (ref 70–99)
Glucose, Bld: 332 mg/dL — ABNORMAL HIGH (ref 70–99)
POTASSIUM: 2.1 mmol/L — AB (ref 3.5–5.1)
Potassium: <2 mmol/L — CL (ref 3.5–5.1)
Potassium: 2.8 mmol/L — ABNORMAL LOW (ref 3.5–5.1)
Potassium: 2.6 mmol/L — CL (ref 3.5–5.1)
SODIUM: 134 mmol/L — AB (ref 135–145)
SODIUM: 134 mmol/L — AB (ref 135–145)
SODIUM: 132 mmol/L — AB (ref 135–145)
Sodium: 133 mmol/L — ABNORMAL LOW (ref 135–145)

## 2017-09-03 LAB — TSH: TSH: 0.777 u[IU]/mL (ref 0.350–4.500)

## 2017-09-03 LAB — HEMOGLOBIN A1C
Hgb A1c MFr Bld: 7.9 % — ABNORMAL HIGH (ref 4.8–5.6)
MEAN PLASMA GLUCOSE: 180.03 mg/dL

## 2017-09-03 LAB — GLUCOSE, CAPILLARY
GLUCOSE-CAPILLARY: 269 mg/dL — AB (ref 70–99)
GLUCOSE-CAPILLARY: 232 mg/dL — AB (ref 70–99)
Glucose-Capillary: 316 mg/dL — ABNORMAL HIGH (ref 70–99)

## 2017-09-03 LAB — CBG MONITORING, ED: Glucose-Capillary: 283 mg/dL — ABNORMAL HIGH (ref 70–99)

## 2017-09-03 LAB — CBC
HCT: 45.9 % (ref 36.0–46.0)
HEMATOCRIT: 38.3 % (ref 36.0–46.0)
HEMOGLOBIN: 14.3 g/dL (ref 12.0–15.0)
Hemoglobin: 12 g/dL (ref 12.0–15.0)
MCH: 25.4 pg — AB (ref 26.0–34.0)
MCH: 25.5 pg — ABNORMAL LOW (ref 26.0–34.0)
MCHC: 31.2 g/dL (ref 30.0–36.0)
MCHC: 31.3 g/dL (ref 30.0–36.0)
MCV: 81.4 fL (ref 78.0–100.0)
MCV: 81.5 fL (ref 78.0–100.0)
Platelets: 250 10*3/uL (ref 150–400)
Platelets: 212 10*3/uL (ref 150–400)
RBC: 5.64 MIL/uL — ABNORMAL HIGH (ref 3.87–5.11)
RBC: 4.7 MIL/uL (ref 3.87–5.11)
RDW: 16.5 % — AB (ref 11.5–15.5)
RDW: 16.5 % — AB (ref 11.5–15.5)
WBC: 19 10*3/uL — ABNORMAL HIGH (ref 4.0–10.5)
WBC: 14.7 10*3/uL — AB (ref 4.0–10.5)

## 2017-09-03 LAB — PROTIME-INR
INR: 2.24
PROTHROMBIN TIME: 24.6 s — AB (ref 11.4–15.2)

## 2017-09-03 LAB — TROPONIN I: Troponin I: <0.03 ng/mL (ref <0.03)

## 2017-09-03 LAB — ECHOCARDIOGRAM COMPLETE

## 2017-09-03 LAB — BRAIN NATRIURETIC PEPTIDE: B Natriuretic Peptide: 137.8 pg/mL — ABNORMAL HIGH (ref 0.0–100.0)

## 2017-09-03 LAB — MAGNESIUM: MAGNESIUM: 2.5 mg/dL — AB (ref 1.7–2.4)

## 2017-09-03 MED ORDER — GABAPENTIN 600 MG PO TABS
600.0000 mg | ORAL_TABLET | Freq: Three times a day (TID) | ORAL | Status: DC
  Administered 2017-09-03 – 2017-09-07 (×13): 600 mg via ORAL
  Filled 2017-09-03 (×15): qty 1

## 2017-09-03 MED ORDER — ALPRAZOLAM 0.25 MG PO TABS
0.2500 mg | ORAL_TABLET | Freq: Three times a day (TID) | ORAL | Status: DC
  Administered 2017-09-03 – 2017-09-07 (×13): 0.25 mg via ORAL
  Filled 2017-09-03 (×14): qty 1

## 2017-09-03 MED ORDER — ACETAMINOPHEN 325 MG PO TABS: 650.0000 mg | ORAL_TABLET | Freq: Four times a day (QID) | ORAL | Status: DC | PRN

## 2017-09-03 MED ORDER — SODIUM CHLORIDE 0.9 % IV SOLN
INTRAVENOUS | Status: DC
  Administered 2017-09-03: 10:00:00 via INTRAVENOUS

## 2017-09-03 MED ORDER — METRONIDAZOLE 500 MG PO TABS: 500.0000 mg | ORAL_TABLET | Freq: Four times a day (QID) | ORAL | Status: DC

## 2017-09-03 MED ORDER — FERROUS SULFATE 325 (65 FE) MG PO TABS
325.0000 mg | ORAL_TABLET | ORAL | Status: DC
  Administered 2017-09-05: 325 mg via ORAL
  Filled 2017-09-03: qty 1

## 2017-09-03 MED ORDER — BUSPIRONE HCL 10 MG PO TABS
10.0000 mg | ORAL_TABLET | Freq: Every day | ORAL | Status: DC
  Administered 2017-09-03 – 2017-09-07 (×5): 10 mg via ORAL
  Filled 2017-09-03 (×5): qty 1

## 2017-09-03 MED ORDER — SODIUM CHLORIDE 0.9 % IV BOLUS (SEPSIS)
1000.0000 mL | Freq: Once | INTRAVENOUS | Status: AC
  Administered 2017-09-03: 1000 mL via INTRAVENOUS

## 2017-09-03 MED ORDER — CIPROFLOXACIN HCL 500 MG PO TABS
500.0000 mg | ORAL_TABLET | Freq: Two times a day (BID) | ORAL | Status: DC
  Administered 2017-09-03: 500 mg via ORAL
  Filled 2017-09-03: qty 1

## 2017-09-03 MED ORDER — CALCIUM-VITAMIN D 500-200 MG-UNIT PO TABS
ORAL_TABLET | Freq: Every day | ORAL | Status: DC
  Filled 2017-09-03 (×3): qty 2

## 2017-09-03 MED ORDER — PROMETHAZINE HCL 25 MG PO TABS: 12.5000 mg | ORAL_TABLET | Freq: Four times a day (QID) | ORAL | Status: DC | PRN

## 2017-09-03 MED ORDER — POTASSIUM CHLORIDE 10 MEQ/100ML IV SOLN
10.0000 meq | INTRAVENOUS | Status: AC
  Administered 2017-09-03 (×2): 10 meq via INTRAVENOUS
  Filled 2017-09-03 (×2): qty 100

## 2017-09-03 MED ORDER — WARFARIN SODIUM 7.5 MG PO TABS
7.5000 mg | ORAL_TABLET | Freq: Once | ORAL | Status: AC
  Administered 2017-09-03: 7.5 mg via ORAL
  Filled 2017-09-03 (×2): qty 1

## 2017-09-03 MED ORDER — MAGNESIUM SULFATE 2 GM/50ML IV SOLN
2.0000 g | Freq: Once | INTRAVENOUS | Status: AC
Start: 2017-09-03 — End: 2017-09-03
  Administered 2017-09-03: 2 g via INTRAVENOUS
  Filled 2017-09-03: qty 50

## 2017-09-03 MED ORDER — HYDROCODONE-ACETAMINOPHEN 5-325 MG PO TABS
1.0000 | ORAL_TABLET | ORAL | Status: DC | PRN
  Administered 2017-09-03 – 2017-09-07 (×11): 2 via ORAL
  Filled 2017-09-03 (×11): qty 2

## 2017-09-03 MED ORDER — PRAMIPEXOLE DIHYDROCHLORIDE 0.25 MG PO TABS
0.1250 mg | ORAL_TABLET | Freq: Every day | ORAL | Status: DC
  Administered 2017-09-03 – 2017-09-07 (×5): 0.125 mg via ORAL
  Filled 2017-09-03 (×5): qty 1

## 2017-09-03 MED ORDER — SERTRALINE HCL 100 MG PO TABS
100.0000 mg | ORAL_TABLET | Freq: Every day | ORAL | Status: DC
  Administered 2017-09-03 – 2017-09-07 (×5): 100 mg via ORAL
  Filled 2017-09-03 (×6): qty 1

## 2017-09-03 MED ORDER — POTASSIUM CHLORIDE 10 MEQ/100ML IV SOLN
10.0000 meq | INTRAVENOUS | Status: AC
  Administered 2017-09-03 – 2017-09-04 (×4): 10 meq via INTRAVENOUS
  Filled 2017-09-03 (×4): qty 100

## 2017-09-03 MED ORDER — VENLAFAXINE HCL ER 75 MG PO CP24
150.0000 mg | ORAL_CAPSULE | Freq: Every day | ORAL | Status: DC
  Administered 2017-09-03 – 2017-09-07 (×5): 150 mg via ORAL
  Filled 2017-09-03: qty 1
  Filled 2017-09-03 (×4): qty 2

## 2017-09-03 MED ORDER — VITAMIN D 1000 UNITS PO TABS
4000.0000 [IU] | ORAL_TABLET | Freq: Every day | ORAL | Status: DC
  Administered 2017-09-03 – 2017-09-07 (×5): 4000 [IU] via ORAL
  Filled 2017-09-03 (×6): qty 4

## 2017-09-03 MED ORDER — GUAIFENESIN ER 600 MG PO TB12: 600.0000 mg | ORAL_TABLET | Freq: Two times a day (BID) | ORAL | Status: DC | PRN

## 2017-09-03 MED ORDER — POTASSIUM CHLORIDE CRYS ER 20 MEQ PO TBCR
40.0000 meq | EXTENDED_RELEASE_TABLET | Freq: Once | ORAL | Status: AC
  Administered 2017-09-03: 40 meq via ORAL
  Filled 2017-09-03: qty 2

## 2017-09-03 MED ORDER — POTASSIUM CHLORIDE 10 MEQ/100ML IV SOLN
10.0000 meq | Freq: Once | INTRAVENOUS | Status: AC
  Administered 2017-09-03: 10 meq via INTRAVENOUS
  Filled 2017-09-03: qty 100

## 2017-09-03 MED ORDER — MAGNESIUM OXIDE 400 (241.3 MG) MG PO TABS
400.0000 mg | ORAL_TABLET | Freq: Three times a day (TID) | ORAL | Status: DC
  Administered 2017-09-03 – 2017-09-07 (×12): 400 mg via ORAL
  Filled 2017-09-03 (×13): qty 1

## 2017-09-03 MED ORDER — LIDOCAINE 5 % EX PTCH: 1.0000 | MEDICATED_PATCH | Freq: Every day | CUTANEOUS | Status: DC | PRN

## 2017-09-03 MED ORDER — POTASSIUM CHLORIDE CRYS ER 20 MEQ PO TBCR
40.0000 meq | EXTENDED_RELEASE_TABLET | Freq: Two times a day (BID) | ORAL | Status: DC
  Administered 2017-09-03 – 2017-09-04 (×4): 40 meq via ORAL
  Filled 2017-09-03 (×4): qty 2

## 2017-09-03 MED ORDER — OXYCODONE-ACETAMINOPHEN 5-325 MG PO TABS
1.0000 | ORAL_TABLET | Freq: Once | ORAL | Status: AC
  Administered 2017-09-03: 1 via ORAL
  Filled 2017-09-03: qty 1

## 2017-09-03 MED ORDER — EZETIMIBE 10 MG PO TABS
10.0000 mg | ORAL_TABLET | Freq: Every day | ORAL | Status: DC
  Administered 2017-09-03 – 2017-09-07 (×5): 10 mg via ORAL
  Filled 2017-09-03 (×6): qty 1

## 2017-09-03 MED ORDER — WARFARIN - PHARMACIST DOSING INPATIENT
Freq: Every day | Status: DC
  Administered 2017-09-05 – 2017-09-06 (×2)

## 2017-09-03 MED ORDER — POTASSIUM CHLORIDE IN NACL 40-0.9 MEQ/L-% IV SOLN
INTRAVENOUS | Status: AC
  Administered 2017-09-03 – 2017-09-04 (×2): 100 mL/h via INTRAVENOUS
  Administered 2017-09-04: 75 mL/h via INTRAVENOUS
  Filled 2017-09-03 (×4): qty 1000

## 2017-09-03 MED ORDER — MELATONIN 3 MG PO TABS
1.5000 mg | ORAL_TABLET | Freq: Every evening | ORAL | Status: DC | PRN
  Filled 2017-09-03: qty 0.5

## 2017-09-03 MED ORDER — SODIUM CHLORIDE 0.9% FLUSH
3.0000 mL | Freq: Two times a day (BID) | INTRAVENOUS | Status: DC
  Administered 2017-09-03 – 2017-09-07 (×5): 3 mL via INTRAVENOUS

## 2017-09-03 MED ORDER — ACETAMINOPHEN 650 MG RE SUPP: 650.0000 mg | Freq: Four times a day (QID) | RECTAL | Status: DC | PRN

## 2017-09-03 MED ORDER — INSULIN ASPART 100 UNIT/ML ~~LOC~~ SOLN
0.0000 [IU] | Freq: Three times a day (TID) | SUBCUTANEOUS | Status: DC
  Administered 2017-09-03: 8 [IU] via SUBCUTANEOUS
  Administered 2017-09-03: 5 [IU] via SUBCUTANEOUS
  Administered 2017-09-04 (×3): 3 [IU] via SUBCUTANEOUS
  Administered 2017-09-05: 5 [IU] via SUBCUTANEOUS
  Administered 2017-09-05: 3 [IU] via SUBCUTANEOUS
  Administered 2017-09-05: 5 [IU] via SUBCUTANEOUS
  Administered 2017-09-06 (×2): 3 [IU] via SUBCUTANEOUS
  Administered 2017-09-07: 5 [IU] via SUBCUTANEOUS
  Filled 2017-09-03: qty 1

## 2017-09-03 MED ORDER — INSULIN DETEMIR 100 UNIT/ML ~~LOC~~ SOLN
66.0000 [IU] | Freq: Every day | SUBCUTANEOUS | Status: DC
  Administered 2017-09-03 – 2017-09-06 (×4): 66 [IU] via SUBCUTANEOUS
  Filled 2017-09-03 (×5): qty 0.66

## 2017-09-03 MED ORDER — MORPHINE SULFATE (PF) 4 MG/ML IV SOLN
0.5000 mg | INTRAVENOUS | Status: DC | PRN
  Administered 2017-09-03 – 2017-09-04 (×4): 0.52 mg via INTRAVENOUS
  Filled 2017-09-03 (×4): qty 1

## 2017-09-03 MED ORDER — ASPIRIN 81 MG PO CHEW
81.0000 mg | CHEWABLE_TABLET | Freq: Every day | ORAL | Status: DC
  Administered 2017-09-03 – 2017-09-07 (×5): 81 mg via ORAL
  Filled 2017-09-03 (×6): qty 1

## 2017-09-03 NOTE — ED Notes (Signed)
Pt transported to echo 

## 2017-09-03 NOTE — ED Notes (Signed)
Donna [name]-Heart Healthy/Carb Modified Breakfast Tray Ordered @ 0940-per RN-called by Levada Dy

## 2017-09-03 NOTE — Plan of Care (Signed)
Progressing

## 2017-09-03 NOTE — ED Notes (Signed)
CBG 269 °

## 2017-09-03 NOTE — ED Notes (Signed)
Patient transported to CT 

## 2017-09-03 NOTE — ED Provider Notes (Signed)
[facility] EMERGENCY DEPARTMENT Provider Note   CSN: [id number] Arrival date & time: 09/03/17  0248     History   Chief Complaint Chief Complaint  Patient presents with  . Loss of Consciousness  . Fall    HPI Donna [name] is a 67 y.o. female.  The history is provided by the patient.  Loss of Consciousness   This is a recurrent (prior to Arrival) problem. The problem has been gradually improving. She lost consciousness for a period of less than one minute. Associated symptoms include weakness. Pertinent negatives include abdominal pain, back pain, chest pain, fever, headaches and vomiting. She has tried nothing for the symptoms.  Fall  Pertinent negatives include no chest pain, no abdominal pain and no headaches.   Patient with history of morbid obesity, atrial fibrillation, diabetes, anxiety, fibromyalgia presents for syncopal episode.  She reports multiple episodes of syncope over the past several weeks.  She reports tonight while going to the restroom, she had an episode of syncope and fell.  She reports landing on her right hip.  Denies head injury, denies headache.  She reports recent diagnosis of diverticulitis with diarrhea and has been taking antibiotics (Cipro/Flagyl) Past Medical History:  Diagnosis Date  . Chronic /permanent atrial fibrillation ([place]) 2005   On Warfarin (follwed @ [facility]); Rate controlled - On BB  . Chronic diastolic heart failure, NYHA class 2 ([place])    Updated by A. fib, hypertensive heart disease and obesity; EDP was only 7 by cardiac catheterization in September 2017  . Diabetes mellitus without complication ([place])   . Diabetic lumbosacral plexopathy ([place])   . Fibromyalgia   . Generalized anxiety disorder   . Incontinence   . Major depressive disorder   . Restless leg syndrome     Patient Active Problem List   Diagnosis Date Noted  . Venous stasis of both lower extremities 07/25/2016  . Essential  hypertension 07/25/2016  . Chronic diastolic heart failure ([place]) 02/08/2016  . Chronic atrial fibrillation ([place]): CHA2DS2-VASc Score 5; on Warfarin 02/08/2016  . Morbid obesity ([place]) 02/08/2016  . OSA (obstructive sleep apnea) 02/08/2016  . Pre-operative cardiovascular examination 02/08/2016  . Medication management 02/08/2016    Past Surgical History:  Procedure Laterality Date  . ABDOMINAL HYSTERECTOMY    . CARDIAC CATHETERIZATION  11/09/2015   [facility]: Nonocclusive CAD. EF 55%. EDP 7 mmHg.  Marland Kitchen CHOLECYSTECTOMY    . HIP SURGERY Right    x2  . KNEE ARTHROSCOPY Right   . MASS EXCISION     sarcoma - shoulder  . ROTATOR CUFF REPAIR Bilateral   . TONSILLECTOMY    . TRANSTHORACIC ECHOCARDIOGRAM  11/2013; 02/2016   a) [facility]: with Definity:  EF 60-65%. Mod LA dilation. Mild AoV sclerosis. Normal RHP. Indeterminate LV filling pressures. b) [facility]: Normal cavity size. EF 55-60%.no RWMA, mild AoV sclerosis (R cusp), Mod MAC. Mild biAtrial dilation.     OB History   None      Home Medications    Prior to Admission medications   Medication Sig Start Date End Date Taking? Authorizing Provider  acetaminophen (TYLENOL) 500 MG tablet Take 500 mg by mouth every 6 (six) hours as needed.    [provider]  ALPRAZolam Duanne Moron) 0.25 MG tablet Take 0.25 mg by mouth 3 (three) times daily as needed for sleep.    [provider]  aspirin 81 MG tablet Take 81 mg by mouth daily.  [provider]  busPIRone (BUSPAR) 5 MG tablet Take 10 mg by mouth 2 (two) times daily.     [provider]  Cholecalciferol (VITAMIN D) 400 UNITS capsule Take 800 Units by mouth daily.    [provider]  desvenlafaxine (PRISTIQ) 100 MG 24 hr tablet Take 100 mg by mouth daily.    [provider]  Dulaglutide (TRULICITY [place]) Inject 2 mg into the skin once a week.    [provider]  ezetimibe (ZETIA) 10 MG tablet Take 10 mg by mouth daily.     [provider]  ferrous sulfate 325 (65 FE) MG tablet Take 325 mg by mouth 2 (two) times daily.    [provider]  furosemide (LASIX) 80 MG tablet Take 1 tablet (80 mg total) by mouth 2 (two) times daily. 08/06/17   Leonie Man, MD  gabapentin (NEURONTIN) 600 MG tablet Take 600 mg by mouth 3 (three) times daily.    [provider]  guaiFENesin (MUCINEX) 600 MG 12 hr tablet Take by mouth 2 (two) times daily.    [provider]  insulin detemir (LEVEMIR) 100 UNIT/ML injection Inject 42 Units into the skin at bedtime.    [provider]  insulin lispro (HUMALOG) 100 UNIT/ML injection Inject into the skin 3 (three) times daily before meals. Sliding scale    [provider]  Insulin Pen Needle (NOVOTWIST) 32G X 5 MM MISC by Does not apply route.    [provider]  lidocaine (LIDODERM) 5 % Place 1 patch onto the skin daily. Remove & Discard patch within 12 hours or as directed by MD    [provider]  magnesium oxide (MAG-OX) 400 MG tablet Take 400 mg by mouth 4 (four) times daily.    [provider]  Melatonin 300 MCG TABS Take 1 tablet by mouth at bedtime as needed.    [provider]  metolazone (ZAROXOLYN) 10 MG tablet Take 1 tablet (10 mg total) by mouth as needed (for swelling). 08/06/17   Leonie Man, MD  metoprolol succinate (TOPROL-XL) 50 MG 24 hr tablet Take 1 tablet (50 mg total) by mouth daily. Take with or immediately following a meal. 08/06/17   Leonie Man, MD  Multiple Vitamin (MULTIVITAMIN) tablet Take 1 tablet by mouth daily.    [provider]  Multiple Vitamins-Minerals (MULTIVITAMIN WITH MINERALS) tablet Take 1 tablet by mouth daily.    [provider]  OxyCODONE HCl, Abuse Deter, 5 MG TABA Take 1 tablet by mouth 3 (three) times daily.    [provider]  potassium chloride SA (K-DUR,KLOR-CON) 20 MEQ tablet Take 2 tablets 40 meq twice a day 02/07/16    Leonie Man, MD  pramipexole (MIRAPEX) 0.125 MG tablet Take 0.125 mg by mouth daily.    [provider]  sertraline (ZOLOFT) 100 MG tablet Take 100 mg by mouth daily.    [provider]  sulfamethoxazole-trimethoprim (BACTRIM,SEPTRA) 400-80 MG tablet Take 1 tablet by mouth 2 (two) times daily.    [provider]  temazepam (RESTORIL) 30 MG capsule Take 30 mg by mouth at bedtime as needed for sleep.    [provider]  Turmeric 500 MG CAPS Take by mouth.    [provider]  warfarin (COUMADIN) 10 MG tablet Take 10 mg by mouth. Monday Wednesday Friday Sunday    [provider]  warfarin (COUMADIN) 7.5 MG tablet Take 7.5 mg by mouth. Tuesday, Thursday, Saturday  [provider]    Family History Family History  Problem Relation Age of Onset  . Hypertension Mother   . Heart disease Mother   . Leukemia Son     Social History Social History   Tobacco Use  . Smoking status: Never Smoker  . Smokeless tobacco: Never Used  Substance Use Topics  . Alcohol use: No  . Drug use: No     Allergies   Bee venom; Fentanyl; Metformin; Metformin and related; Metformin hcl; Other; Alteplase; and Cefepime   Review of Systems Review of Systems  Constitutional: Negative for fever.  Cardiovascular: Positive for syncope. Negative for chest pain.  Gastrointestinal: Negative for abdominal pain and vomiting.  Musculoskeletal: Positive for arthralgias. Negative for back pain.  Neurological: Positive for syncope and weakness. Negative for headaches.  All other systems reviewed and are negative.    Physical Exam Updated Vital Signs BP 101/73   Pulse 91   Resp 17   SpO2 96%   Physical Exam CONSTITUTIONAL: Chronically ill-appearing HEAD: Normocephalic/atraumatic EYES: EOMI/PERRL ENMT: Mucous membranes dry NECK: supple no meningeal signs SPINE/BACK:entire spine nontender, no bruising/crepitance/stepoffs noted to spine CV:  Irregular, no loud murmurs LUNGS: Lungs are clear to auscultation bilaterally, no apparent distress Chest-no crepitus or bruising ABDOMEN: soft, nontender, no rebound or guarding, bowel sounds noted throughout abdomen, exam limited by obesity.  Old appearing bruising noted to abdominal wall, no focal tenderness, no hematomas GU:no cva tenderness NEURO: Pt is awake/alert/appropriate, moves all extremitiesx4.  No facial droop.   EXTREMITIES: pulses normal/equal, full ROM, bruising and tenderness to left knee.  Tenderness to palpation of right buttock.  I can fully range right hip without difficulty.  No deformities noted to either femur.  All other extremities/joints palpated/ranged and nontender SKIN: warm, color normal PSYCH: no abnormalities of mood noted, alert and oriented to situation   ED Treatments / Results  Labs (all labs ordered are listed, but only abnormal results are displayed) Labs Reviewed  BASIC METABOLIC PANEL - Abnormal; Notable for the following components:      Result Value   Sodium 133 (*)    Potassium <2.0 (*)    Chloride 84 (*)    Glucose, Bld 389 (*)    BUN 36 (*)    Creatinine, Ser 1.66 (*)    GFR calc non Af Amer 31 (*)    GFR calc Af Amer 36 (*)    Anion gap 17 (*)    All other components within normal limits  CBC - Abnormal; Notable for the following components:   WBC 19.0 (*)    RBC 5.64 (*)    MCH 25.4 (*)    RDW 16.5 (*)    All other components within normal limits  PROTIME-INR - Abnormal; Notable for the following components:   Prothrombin Time 24.6 (*)    All other components within normal limits  TROPONIN I  URINALYSIS, ROUTINE W REFLEX MICROSCOPIC  BASIC METABOLIC PANEL  MAGNESIUM  CBG MONITORING, ED    EKG EKG Interpretation  Date/Time:  Thursday September 03 2017 03:40:52 EDT Ventricular Rate:  82 PR Interval:    QRS Duration: 131 QT Interval:  516 QTC Calculation: 611 R Axis:   -14 Text Interpretation:  Atrial fibrillation LVH with  secondary repolarization abnormality Inferior infarct, old Prolonged QT interval Abnormal ekg Interpretation limited secondary to artifact Confirmed by Ripley Fraise ([id number]) on 09/03/2017 3:45:04 AM   Radiology Dg Pelvis Portable  Result Date: 09/03/2017 CLINICAL DATA:  67 y/o F;  multiple syncopal episodes with fall. Right hip pain and knee pain. EXAM: PORTABLE PELVIS 1-2 VIEWS COMPARISON:  03/19/2015 CT abdomen and pelvis. FINDINGS: There is no evidence of pelvic fracture or diastasis. No pelvic bone lesions are seen. Right total hip arthroplasty partially visualized. IMPRESSION: No acute fracture or dislocation identified. Electronically Signed   By: Kristine Garbe M.D.   On: 09/03/2017 04:37   Dg Chest Portable 1 View  Result Date: 09/03/2017 CLINICAL DATA:  Multiple syncopal episodes with orthostatic changes. Her fall. EXAM: PORTABLE CHEST 1 VIEW COMPARISON:  11/06/2015 FINDINGS: Heart size and pulmonary vascularity are normal for technique. Lungs are clear and expanded. No blunting of costophrenic angles. No pneumothorax. Mediastinal contours appear intact. Calcification of the aorta. IMPRESSION: No active disease. Electronically Signed   By: Lucienne Capers M.D.   On: 09/03/2017 04:40   Dg Knee Left Port  Result Date: 09/03/2017 CLINICAL DATA:  67 y/o F; multiple syncopal episodes and fall with knee pain. EXAM: PORTABLE LEFT KNEE - 1-2 VIEW COMPARISON:  None. FINDINGS: No evidence of fracture, dislocation, or joint effusion. Moderate narrowing of the medial femorotibial compartment with sclerosis of articular surfaces. Tricompartmental osteophytosis. Vascular calcifications. Bones are demineralized. IMPRESSION: 1.  No acute fracture or dislocation identified. 2. Moderate tricompartmental osteoarthrosis greatest in the medial femorotibial compartment. Electronically Signed   By: Kristine Garbe M.D.   On: 09/03/2017 04:43    Procedures Procedures  CRITICAL CARE Performed  by: Sharyon Cable Total critical care time: 35 minutes Critical care time was exclusive of separately billable procedures and treating other patients. Critical care was necessary to treat or prevent imminent or life-threatening deterioration. Critical care was time spent personally by me on the following activities: development of treatment plan with patient and/or surrogate as well as nursing, discussions with consultants, evaluation of patient's response to treatment, examination of patient, obtaining history from patient or surrogate, ordering and performing treatments and interventions, ordering and review of laboratory studies, ordering and review of radiographic studies, pulse oximetry and re-evaluation of patient's condition.   Medications Ordered in ED Medications  potassium chloride 10 mEq in 100 mL IVPB (has no administration in time range)  oxyCODONE-acetaminophen (PERCOCET/ROXICET) 5-325 MG per tablet 1 tablet (1 tablet Oral Given 09/03/17 0452)  sodium chloride 0.9 % bolus 1,000 mL (0 mLs Intravenous Stopped 09/03/17 0618)  potassium chloride SA (K-DUR,KLOR-CON) CR tablet 40 mEq (40 mEq Oral Given 09/03/17 0500)  potassium chloride 10 mEq in 100 mL IVPB (0 mEq Intravenous Stopped 09/03/17 0618)  magnesium sulfate IVPB 2 g 50 mL (0 g Intravenous Stopped 09/03/17 0618)     Initial Impression / Assessment and Plan / ED Course  I have reviewed the triage vital signs and the nursing notes.  Pertinent labs & imaging results that were available during my care of the patient were reviewed by me and considered in my medical decision making (see chart for details).     5:15 AM Patient presents with recurrent syncopal episode.  She was found to have significant dehydration with hypokalemia and EKG changes/prolonged qt.  Her only pain complaint is right hip/buttock/knee.  No signs of any pelvic injury, no signs of any acute injury to the hip. Denies head injury, no signs of neck injury.  She  has no spinal tenderness. No focal abdominal tenderness.  She will need to be admitted the hospital for severe/life-threatening hypokalemia. 6:48 AM BP (!) 98/59   Pulse 83   Temp 97.7 F (36.5 C) (Oral)  Resp (!) 21   SpO2 96%  Discussed with Dr. Myna Hidalgo for admission. Final Clinical Impressions(s) / ED Diagnoses   Final diagnoses:  Dehydration  Hypokalemia  Hyperglycemia  Syncope and collapse  Contusion of left knee, initial encounter  Hip strain, right, initial encounter    ED Discharge Orders    None       Ripley Fraise, MD 09/03/17 [phone]

## 2017-09-03 NOTE — ED Notes (Signed)
Urine cx collected/sent w/UA. [employer]

## 2017-09-03 NOTE — Evaluation (Signed)
Physical Therapy Evaluation Patient Details Name: Donna [name] MRN: [id number] DOB: [birth year] Today's Date: 09/03/2017   History of Present Illness  67 y.o. female with a Past Medical History of RLS; depression/anxiety; fibromyalgia; DM; afib on Coumadin; and chronic diastolic heart failure who presents with syncope leading to a fall. Dx with syncope, in the setting of volume loss due to diuretics and recent diarrhea, severe hypokalemia, chronic atrial fibrillation and diet.   Clinical Impression  Pt admitted with above diagnosis. Pt currently with functional limitations due to the deficits listed below (see PT Problem List). Pt reports that prior to admission she was able to ambulate with use of RW limited household distances and was independent with ADLs and able to assist in limited cooking and cleaning. Pt tearful throughout session and reports too painful with any hip and knee movement that she refused to attempt to sit EoB. Review of radiological results indicate no fractures causing this increased pain. PT attempt more thorough evaluation of mobility in subsequent sessions.  Pt will benefit from skilled PT to increase their independence and safety with mobility to allow discharge to the venue listed below.       Follow Up Recommendations SNF    Equipment Recommendations  None recommended by PT    Recommendations for Other Services       Precautions / Restrictions Precautions Precautions: Fall Restrictions Weight Bearing Restrictions: No      Mobility  Bed Mobility               General bed mobility comments: would not attempt due to pain with movement                      Pertinent Vitals/Pain Pain Assessment: 0-10 Pain Score: 10-Worst pain ever Pain Location: R hip, L knee, back Pain Descriptors / Indicators: Stabbing;Sharp;Shooting;Grimacing;Guarding Pain Intervention(s): Premedicated before session;Limited activity within patient's tolerance;Relaxation     Home Living Family/patient expects to be discharged to:: Private residence Living Arrangements: Spouse/significant other Available Help at Discharge: Family;Available 24 hours/day Type of Home: Mobile home Home Access: Stairs to enter Entrance Stairs-Rails: Can reach both Entrance Stairs-Number of Steps: 3 Home Layout: One level Home Equipment: Bedside commode;Tub bench;Grab bars - toilet;Grab bars - tub/shower;Walker - 2 wheels;Hand held shower head;Wheelchair - manual      Prior Function Level of Independence: Needs assistance   Gait / Transfers Assistance Needed: limited household ambulation   ADL's / Homemaking Assistance Needed: husband does shopping, able to cook and clean, independent in ADLs  Comments: difficult to determine if prior function level accurate, no family present to cooborate        Extremity/Trunk Assessment   Upper Extremity Assessment Upper Extremity Assessment: Defer to OT evaluation    Lower Extremity Assessment Lower Extremity Assessment: RLE deficits/detail;LLE deficits/detail RLE Deficits / Details: able to plantar/dorsi flex ankle, movement of R hip in heel slides caused pain and pt refused to move past 45 degrees of knee flexion RLE: Unable to fully assess due to pain RLE Sensation: decreased light touch LLE Deficits / Details: able to plantar/dorsiflex ankle, pain in L knee with heel slides that radiates to R hip, double checked with pt about pain pattern and she asserted pain was in R hip with L knee movement LLE: Unable to fully assess due to pain LLE Sensation: decreased light touch       Communication   Communication: No difficulties  Cognition Arousal/Alertness: Awake/alert Behavior During Therapy: Anxious;Agitated Overall Cognitive  Status: No family/caregiver present to determine baseline cognitive functioning                                 General Comments: anxiety, often tearful during interview, constant crying  with LE assessment,       General Comments General comments (skin integrity, edema, etc.): Pt in Afib during evaluation, on 2L O2 via nasal cannula SaO2 100%O2, HR 111 bpm,  BP 100/52        Assessment/Plan    PT Assessment Patient needs continued PT services  PT Problem List Decreased strength;Decreased range of motion;Decreased activity tolerance;Decreased mobility;Pain       PT Treatment Interventions DME instruction;Gait training;Functional mobility training;Stair training;Therapeutic activities;Therapeutic exercise;Balance training;Cognitive remediation;Patient/family education    PT Goals (Current goals can be found in the Care Plan section)  Acute Rehab PT Goals Patient Stated Goal: stop hurting PT Goal Formulation: With patient Time For Goal Achievement: 09/17/17 Potential to Achieve Goals: Poor    Frequency Min 2X/week   Barriers to discharge Inaccessible home environment         AM-PAC PT "6 Clicks" Daily Activity  Outcome Measure Difficulty turning over in bed (including adjusting bedclothes, sheets and blankets)?: Unable Difficulty moving from lying on back to sitting on the side of the bed? : Unable Difficulty sitting down on and standing up from a chair with arms (e.g., wheelchair, bedside commode, etc,.)?: Unable Help needed moving to and from a bed to chair (including a wheelchair)?: Total Help needed walking in hospital room?: Total Help needed climbing 3-5 steps with a railing? : Total 6 Click Score: 6    End of Session Equipment Utilized During Treatment: Oxygen Activity Tolerance: Patient limited by pain Patient left: in bed;with call bell/phone within reach;with bed alarm set Nurse Communication: Mobility status PT Visit Diagnosis: Pain;Muscle weakness (generalized) (M62.81);History of falling (Z91.81) Pain - Right/Left: (bilateral) Pain - part of body: (R hip, L knee and back )    Time: 9450-3888 PT Time Calculation (min) (ACUTE ONLY): 22  min   Charges:   PT Evaluation $PT Eval Moderate Complexity: 1 Mod     PT G Codes:        Finleigh Cheong B. Migdalia Dk PT, DPT Acute Rehabilitation  [phone] Pager [phone]    [place] 09/03/2017, 5:31 PM

## 2017-09-03 NOTE — Progress Notes (Signed)
  Echocardiogram 2D Echocardiogram has been performed.  Donna [name] F 09/03/2017, 9:46 AM

## 2017-09-03 NOTE — ED Notes (Signed)
Joe Melody: husband [phone] Haillie Radu: son [phone]

## 2017-09-03 NOTE — Progress Notes (Signed)
ANTICOAGULATION CONSULT NOTE - Initial Consult  Pharmacy Consult for Warfarin Indication: atrial fibrillation  Allergies  Allergen Reactions  . Bee Venom Hives    Takes benadryl  . Fentanyl Other (See Comments)    Hallucinations, syncope    . Metformin Diarrhea  . Metformin And Related Diarrhea  . Metformin Hcl Diarrhea  . Other Other (See Comments)    Raw foods with seeds give her "boils".  Avoids raw strawberries, blueberries. Tolerates cooked fruits, tomato sauce, bread/grains with seeds. [facility] 10/17/13 Berries with seeds  . Alteplase Rash  . Cefepime Rash    Patient Measurements:    Vital Signs: Temp: 97.7 F (36.5 C) (07/04 0550) Temp Source: Oral (07/04 0550) BP: 97/82 (07/04 0700) Pulse Rate: 90 (07/04 0700)  Labs: Recent Labs    09/03/17 0327 09/03/17 0330 09/03/17 0527  HGB 14.3  --   --   HCT 45.9  --   --   PLT 250  --   --   LABPROT  --  24.6*  --   INR  --  2.24  --   CREATININE 1.66*  --   --   TROPONINI  --   --  <0.03    CrCl cannot be calculated (Unknown ideal weight.).   Medical History: Past Medical History:  Diagnosis Date  . Chronic /permanent atrial fibrillation ([place]) 2005   On Warfarin (follwed @ [facility]); Rate controlled - On BB  . Chronic diastolic heart failure, NYHA class 2 ([place])    Updated by A. fib, hypertensive heart disease and obesity; EDP was only 7 by cardiac catheterization in September 2017  . Diabetes mellitus without complication ([place])   . Diabetic lumbosacral plexopathy ([place])   . Fibromyalgia   . Generalized anxiety disorder   . Incontinence   . Major depressive disorder   . Restless leg syndrome     Medications:  Warfarin 7.5mg  po daily except 10mg  of Tuesday prior to admission.   Assessment: 67 year old female on warfarin prior to admission for atrial fibrillation who presents to the ED this AM with syncopal fall. Patient denies hitting head or headache. No acute fractures on xray. No  head CT or MRI - not recommended per primary team.   INR today is therapeutic at 2.24.  Last dose of Warfarin was 7/3 PM.  CBC is within normal limits. No bleeding noted.   Goal of Therapy:  INR 2-3 Monitor platelets by anticoagulation protocol: Yes   Plan:  Warfarin 7.5mg  po x1 tonight. Daily PT/INR  Sloan Leiter, PharmD, BCPS, BCCCP Clinical Pharmacist Clinical phone 09/03/2017 until 3:30PM [phone] After hours, please call [phone] 09/03/2017,7:24 AM

## 2017-09-03 NOTE — ED Notes (Signed)
Pt complains of multiple syncopal episodes today. Pt reports these are not uncommon for her but she has pain in her right hip and right knee which she had both replaced.

## 2017-09-03 NOTE — ED Notes (Signed)
Dr. Lorin Mercy paged to [place], RN-paged by Levada Dy

## 2017-09-03 NOTE — H&P (Addendum)
History and Physical    Donna [name]:[id number] DOB: [birth year] DOA: 09/03/2017   PCP: Guadlupe Spanish, MD   Patient coming from:  Home    Chief Complaint: Syncope  HPI: Donna [name] is a 67 y.o. female with medical history significant for morbid obesity on keto diet (lost 40 pounds in the last 6 months), atrial fibrillation CHADSVASC 5 on Coumadin, chronic diastolic heart failure class II, CAD with last catheterization in September 2017, hypertension, diabetes with neuropathy, fibromyalgia, generalized anxiety, depression, restless leg syndrome, remote seizures, OSA, osteoarthritis, with status post right hip surgery x2, right knee arthroscopy in December 2018, brought via EMS after sustaining a syncopal episode at home had lasted about 1 minute while going to the restroom, falling her left hip, hitting her left knee, but denies head injury. She was held by her husband,  This was preceded by a recent diagnosis of diverticulitis with diarrhea, for which she was taking antibiotics with Cipro and Flagyl.  During the period of time, she had presyncopal episodes, but denies vasovagal symptoms.  She feels very fatigued. Her Cardiologist discontinued in June 2019  her ACE inhibitor in view of better control blood pressure, but she did continue with Lasix 80 mg twice daily and Zarozolyn. At the time, she did not report having true syncope or near syncope, but was felt to be due to deconditioning. She denies any  Recent seizures.  She denies any vision changes, headaches. No dysarthria or dysphagia. Denies any unilateral weakness, or confusion.   Denies any chest pain, palpitations, or shortness of breath or cough. Denies any fever or chills, or diaphoresis.  Her diarrhea is improving, she denies any abdominal pain. Her diarrhea is improving.  Denies  any sick contacts or new foods as mentioned above, she is still on the keto diet and is planning to lose another 10 pounds. Denies any recent long distance  trips.  Denies a history of PE or DVT, and is compliant with her Coumadin. Denies lower extremity swelling or calf pain . Denies abnormal skin rashes. No tobacco, alcohol or recreational drug use.     ED Course:  BP 97/82   Pulse 90   Temp 97.7 F (36.5 C) (Oral)   Resp 15   SpO2 96%   Sodium 133, potassium less than 2 Glucose 389, (has not taken her insulin this morning )BUN 36, creatinine 1.66, GFR 31 White count 19 Hemoglobin 14.3, platelets 250. PT 24.6, INR 2.24 UA pending EKG atrial fibrillation, with LVH, with secondary repolarization abnormality, old inferior infarct, prolonged QT Troponin less than 0.03 Pelvic, and left knee x-ray negative for fracture or dislocation Chest x-ray without active disease. She received 40 mEq of potassium by mouth, and 20  mEq IV Magnesium sulfate 2 g She also received Percocet pain She received 1 L of IV fluids normal saline Last 2D echo December 2017 EF 55 to 67%, normal systolic     Review of Systems:  As per HPI otherwise all other systems reviewed and are negative  Past Medical History:  Diagnosis Date  . Chronic /permanent atrial fibrillation ([place]) 2005   On Warfarin (follwed @ [facility]); Rate controlled - On BB  . Chronic diastolic heart failure, NYHA class 2 ([place])    Updated by A. fib, hypertensive heart disease and obesity; EDP was only 7 by cardiac catheterization in September 2017  . Diabetes mellitus without complication ([place])   . Diabetic lumbosacral plexopathy ([place])   .  Fibromyalgia   . Generalized anxiety disorder   . Incontinence   . Major depressive disorder   . Restless leg syndrome     Past Surgical History:  Procedure Laterality Date  . ABDOMINAL HYSTERECTOMY    . CARDIAC CATHETERIZATION  11/09/2015   [facility]: Nonocclusive CAD. EF 55%. EDP 7 mmHg.  [facility] CHOLECYSTECTOMY    . HIP SURGERY Right    x2  . KNEE ARTHROSCOPY Right   . MASS EXCISION     sarcoma - shoulder  . ROTATOR  CUFF REPAIR Bilateral   . TONSILLECTOMY    . TRANSTHORACIC ECHOCARDIOGRAM  11/2013; 02/2016   a) [facility]: with Definity:  EF 60-65%. Mod LA dilation. Mild AoV sclerosis. Normal RHP. Indeterminate LV filling pressures. b) [facility]: Normal cavity size. EF 55-60%.no RWMA, mild AoV sclerosis (R cusp), Mod MAC. Mild biAtrial dilation.    Social History Social History   Socioeconomic History  . Marital status: Married    Spouse name: Not on file  . Number of children: Not on file  . Years of education: Not on file  . Highest education level: Not on file  Occupational History  . Not on file  Social Needs  . Financial resource strain: Not on file  . Food insecurity:    Worry: Not on file    Inability: Not on file  . Transportation needs:    Medical: Not on file    Non-medical: Not on file  Tobacco Use  . Smoking status: Never Smoker  . Smokeless tobacco: Never Used  Substance and Sexual Activity  . Alcohol use: No  . Drug use: No  . Sexual activity: Not on file  Lifestyle  . Physical activity:    Days per week: Not on file    Minutes per session: Not on file  . Stress: Not on file  Relationships  . Social connections:    Talks on phone: Not on file    Gets together: Not on file    Attends religious service: Not on file    Active member of club or organization: Not on file    Attends meetings of clubs or organizations: Not on file    Relationship status: Not on file  . Intimate partner violence:    Fear of current or ex partner: Not on file    Emotionally abused: Not on file    Physically abused: Not on file    Forced sexual activity: Not on file  Other Topics Concern  . Not on file  Social History Narrative  . Not on file     Allergies  Allergen Reactions  . Bee Venom Hives    Takes benadryl  . Fentanyl Other (See Comments)    Hallucinations, syncope    . Metformin Diarrhea  . Metformin And Related Diarrhea  . Metformin Hcl Diarrhea  . Other Other  (See Comments)    Raw foods with seeds give her "boils".  Avoids raw strawberries, blueberries. Tolerates cooked fruits, tomato sauce, bread/grains with seeds. [facility] 10/17/13 Berries with seeds  . Alteplase Rash  . Cefepime Rash    Family History  Problem Relation Age of Onset  . Hypertension Mother   . Heart disease Mother   . Leukemia Son        Prior to Admission medications   Medication Sig Start Date End Date Taking? Authorizing Provider  acetaminophen (TYLENOL) 500 MG tablet Take 500 mg by mouth every 6 (six) hours as needed.  [provider]  ALPRAZolam Duanne Moron) 0.25 MG tablet Take 0.25 mg by mouth 3 (three) times daily as needed for sleep.    [provider]  aspirin 81 MG tablet Take 81 mg by mouth daily.    [provider]  busPIRone (BUSPAR) 5 MG tablet Take 10 mg by mouth 2 (two) times daily.     [provider]  Cholecalciferol (VITAMIN D) 400 UNITS capsule Take 800 Units by mouth daily.    [provider]  desvenlafaxine (PRISTIQ) 100 MG 24 hr tablet Take 100 mg by mouth daily.    [provider]  Dulaglutide (TRULICITY [place]) Inject 2 mg into the skin once a week.    [provider]  ezetimibe (ZETIA) 10 MG tablet Take 10 mg by mouth daily.    [provider]  ferrous sulfate 325 (65 FE) MG tablet Take 325 mg by mouth 2 (two) times daily.    [provider]  furosemide (LASIX) 80 MG tablet Take 1 tablet (80 mg total) by mouth 2 (two) times daily. 08/06/17   Leonie Man, MD  gabapentin (NEURONTIN) 600 MG tablet Take 600 mg by mouth 3 (three) times daily.    [provider]  guaiFENesin (MUCINEX) 600 MG 12 hr tablet Take by mouth 2 (two) times daily.    [provider]  insulin detemir (LEVEMIR) 100 UNIT/ML injection Inject 42 Units into the skin at bedtime.    [provider]  insulin lispro (HUMALOG) 100 UNIT/ML injection Inject into the skin 3 (three) times daily  before meals. Sliding scale    [provider]  Insulin Pen Needle (NOVOTWIST) 32G X 5 MM MISC by Does not apply route.    [provider]  lidocaine (LIDODERM) 5 % Place 1 patch onto the skin daily. Remove & Discard patch within 12 hours or as directed by MD    [provider]  magnesium oxide (MAG-OX) 400 MG tablet Take 400 mg by mouth 4 (four) times daily.    [provider]  Melatonin 300 MCG TABS Take 1 tablet by mouth at bedtime as needed.    [provider]  metolazone (ZAROXOLYN) 10 MG tablet Take 1 tablet (10 mg total) by mouth as needed (for swelling). 08/06/17   Leonie Man, MD  metoprolol succinate (TOPROL-XL) 50 MG 24 hr tablet Take 1 tablet (50 mg total) by mouth daily. Take with or immediately following a meal. 08/06/17   Leonie Man, MD  Multiple Vitamin (MULTIVITAMIN) tablet Take 1 tablet by mouth daily.    [provider]  Multiple Vitamins-Minerals (MULTIVITAMIN WITH MINERALS) tablet Take 1 tablet by mouth daily.    [provider]  OxyCODONE HCl, Abuse Deter, 5 MG TABA Take 1 tablet by mouth 3 (three) times daily.    [provider]  potassium chloride SA (K-DUR,KLOR-CON) 20 MEQ tablet Take 2 tablets 40 meq twice a day 02/07/16   Leonie Man, MD  pramipexole (MIRAPEX) 0.125 MG tablet Take 0.125 mg by mouth daily.    [provider]  sertraline (ZOLOFT) 100 MG tablet Take 100 mg by mouth daily.    [provider]  sulfamethoxazole-trimethoprim (BACTRIM,SEPTRA) 400-80 MG tablet Take 1 tablet by mouth 2 (two) times daily.    [provider]  temazepam (RESTORIL) 30 MG capsule Take 30 mg by mouth at bedtime as needed for sleep.    [provider]  Turmeric 500 MG CAPS Take by mouth.  [provider]  warfarin (COUMADIN) 10 MG tablet Take 10 mg by mouth. Monday Wednesday Friday Sunday    [provider]  warfarin (COUMADIN) 7.5 MG tablet Take 7.5 mg  by mouth. Tuesday, Thursday, Saturday    [provider]     Physical Exam:  Vitals:   09/03/17 0615 09/03/17 0630 09/03/17 0645 09/03/17 0700  BP: (!) 98/59 103/62  97/82  Pulse: 83 73 83 90  Resp: (!) 21 19 17 15   Temp:      TempSrc:      SpO2: 96% 94% 97% 96%   Constitutional: NAD, calm, ill appearing  Eyes: PERRL, lids and conjunctivae normal ENMT: Mucous membranes are dry, without exudate or lesions  Neck: normal, supple, no masses, no thyromegaly Respiratory: clear to auscultation bilaterally, no wheezing, no crackles. Normal respiratory effort  Cardiovascular: irregularly irregular and rhythm,  murmur, rubs or gallops. Trace lower extremity edema. 2+ pedal pulses. No carotid bruits.  Abdomen: Soft, obese non tender, No hepatosplenomegaly. Bowel sounds positive.  Musculoskeletal: no clubbing / cyanosis. Moves all extremities Skin: no jaundice, No lesions. Venous stasis changes bilateral lower extremities Neurologic: Sensation intact  Strength equal in all extremities Psychiatric:   Alert and oriented x 3. Depressed mood.     Labs on Admission: I have personally reviewed following labs and imaging studies  CBC: Recent Labs  Lab 09/03/17 0327  WBC 19.0*  HGB 14.3  HCT 45.9  MCV 81.4  PLT 696    Basic Metabolic Panel: Recent Labs  Lab 09/03/17 0327  NA 133*  K <2.0*  CL 84*  CO2 32  GLUCOSE 389*  BUN 36*  CREATININE 1.66*  CALCIUM 9.4    GFR: CrCl cannot be calculated (Unknown ideal weight.).  Liver Function Tests: No results for input(s): AST, ALT, ALKPHOS, BILITOT, PROT, ALBUMIN in the last 168 hours. No results for input(s): LIPASE, AMYLASE in the last 168 hours. No results for input(s): AMMONIA in the last 168 hours.  Coagulation Profile: Recent Labs  Lab 09/03/17 0330  INR 2.24    Cardiac Enzymes: Recent Labs  Lab 09/03/17 0527  TROPONINI <0.03    BNP (last 3 results) No results for input(s): PROBNP in the last 8760  hours.  HbA1C: No results for input(s): HGBA1C in the last 72 hours.  CBG: No results for input(s): GLUCAP in the last 168 hours.  Lipid Profile: No results for input(s): CHOL, HDL, LDLCALC, TRIG, CHOLHDL, LDLDIRECT in the last 72 hours.  Thyroid Function Tests: No results for input(s): TSH, T4TOTAL, FREET4, T3FREE, THYROIDAB in the last 72 hours.  Anemia Panel: No results for input(s): VITAMINB12, FOLATE, FERRITIN, TIBC, IRON, RETICCTPCT in the last 72 hours.  Urine analysis:    Component Value Date/Time   COLORURINE YELLOW 09/06/2010 1130   APPEARANCEUR CLOUDY (A) 09/06/2010 1130   LABSPEC 1.020 09/06/2010 1130   PHURINE 7.0 09/06/2010 1130   GLUCOSEU NEGATIVE 09/06/2010 1130   HGBUR NEGATIVE 09/06/2010 1130   BILIRUBINUR NEGATIVE 09/06/2010 1130   KETONESUR NEGATIVE 09/06/2010 1130   PROTEINUR NEGATIVE 09/06/2010 1130   UROBILINOGEN 1.0 09/06/2010 1130   NITRITE POSITIVE (A) 09/06/2010 1130   LEUKOCYTESUR LARGE (A) 09/06/2010 1130    Sepsis Labs: @LABRCNTIP (procalcitonin:4,lacticidven:4) )No results found for this or any previous visit (from the past 240 hour(s)).   Radiological Exams on Admission: Dg Pelvis Portable  Result Date: 09/03/2017 CLINICAL DATA:  67 y/o F; multiple syncopal episodes with fall. Right hip pain and knee pain. EXAM: PORTABLE PELVIS  1-2 VIEWS COMPARISON:  03/19/2015 CT abdomen and pelvis. FINDINGS: There is no evidence of pelvic fracture or diastasis. No pelvic bone lesions are seen. Right total hip arthroplasty partially visualized. IMPRESSION: No acute fracture or dislocation identified. Electronically Signed   By: Kristine Garbe M.D.   On: 09/03/2017 04:37   Dg Chest Portable 1 View  Result Date: 09/03/2017 CLINICAL DATA:  Multiple syncopal episodes with orthostatic changes. Her fall. EXAM: PORTABLE CHEST 1 VIEW COMPARISON:  11/06/2015 FINDINGS: Heart size and pulmonary vascularity are normal for technique. Lungs are clear and  expanded. No blunting of costophrenic angles. No pneumothorax. Mediastinal contours appear intact. Calcification of the aorta. IMPRESSION: No active disease. Electronically Signed   By: Lucienne Capers M.D.   On: 09/03/2017 04:40   Dg Knee Left Port  Result Date: 09/03/2017 CLINICAL DATA:  67 y/o F; multiple syncopal episodes and fall with knee pain. EXAM: PORTABLE LEFT KNEE - 1-2 VIEW COMPARISON:  None. FINDINGS: No evidence of fracture, dislocation, or joint effusion. Moderate narrowing of the medial femorotibial compartment with sclerosis of articular surfaces. Tricompartmental osteophytosis. Vascular calcifications. Bones are demineralized. IMPRESSION: 1.  No acute fracture or dislocation identified. 2. Moderate tricompartmental osteoarthrosis greatest in the medial femorotibial compartment. Electronically Signed   By: Kristine Garbe M.D.   On: 09/03/2017 04:43    EKG: Independently reviewed.  Assessment/Plan Principal Problem:   Syncope Active Problems:   Hypokalemia due to loss of potassium   Chronic diastolic heart failure (HCC)   Chronic atrial fibrillation ([place]): CHA2DS2-VASc Score 5; on Warfarin   Morbid obesity (HCC)   OSA (obstructive sleep apnea)   Venous stasis of both lower extremities   Essential hypertension   Fibromyalgia   Depression   Anxiety   Anticoagulant long-term use   Anatomical narrow angle borderline glaucoma of both eyes   Restless legs syndrome   History of seizures     Syncope, in the setting of volume loss due to diuretics and recent diarrhea, severe hypokalemia, chronic atrial fibrillation and diet.  Low suspicion for vasovagal, orthostatics not checked.  No history of PE or DVT.  Urinalysis is pending.  White count 19.  No bleeding noted.  She does have borderline hypotension  [place] syncope rule places the patient in  NOT in the low-risk group for serious outcome.Chest x-ray without active disease.  EKG shows atrial fibrillation, with  LVH, with secondary repolarization abnormality, old inferior infarct, prolonged QT.  Troponin less than 0.03.  Low GFR at 31.  She denies palpitations before syncopal episode. Last 2D echo December 2017 EF 55 to 26%, normal systolic Syncope order set  Inpatient stepdown Check orthostatics Fall precautions 2 D echo  Gentle IVF in view of patient with CHF and diuretics being held Hold Beta blockers and other BP meds Neuro checks Will entertain cardiac consult if symptoms do not improve  Patient has a remote history of seizures, but will not order EEG at this time, as this does not appear to have been a seizure event. Hold muscle relaxers   CAD, last catheterization in September 2017, EKG atrial fibrillation, with LVH, with secondary repolarization abnormality, old inferior infarct, prolonged QT  . Tn less than 0.03  patient is cardiac pain free at this time. Continue ASA, high dose statin, beta blockers on hold  Repeat EKG in am   Diastolic CHF: No acute decompensation, weight 276 lbs  Last 2D echo December 2017 EF 55 to 33%, normal systolic Hold Lasix and Zaroxolyn due  to above Hold Beta Blocker Obtain daily weights Monitor intake and output Check BNP, TSH   Atrial Fibrillation CHA2DS2-VASc score 5 on anticoagulation with Coumadin PT 24.6, INR 2.24  Rate controlled Continue Coumadin per pharmacy    Hypertension BP 97/82   Pulse 90  (recent volume loss and diuretics)    Hold home anti-hypertensive medications, diuretics    Hyperlipidemia Continue home statins   Leukocytosis, likely reactive, UA is pending, patient had a history of recent diarrhea treated with Cipro and Flagyl, volume loss. Non toxic appearing . WBC 19  Afebrile  Recheck CBC in a.m. Pending on the UA, will obtain urine cultures Will refrain from further antibiotic treatment for now, until patient is better hydrated, and repeat CBC does not show improvement despite IV fluids  Severe Hypokalemia, in the setting  of volume loss due to diuretics and recent diarrhea, recent dc of ACEI on presentation K was less than 2, received total of 60 meq, IV Mg 2 g  Continue her home oral replenishment at 40 mEq twice daily for now, with recheck in her BP meds in a few hours, will adjust accordingly Continue her home oral magnesium replenishment this evening Serial BMET  Recent diarrhea, patient is being treated since August 26, 2017 with Cipro and Flagyl.  No diarrhea on admission.  Afebrile, white count is 19 Will continue home Cipro and Flagyl to complete her therapy   Type II Diabetes with neuropathy Current blood sugar level is 389, (has not taken her insulin this morning ) No results found for: HGBA1C Hgb A1C  Levemir,  SSI Continue Neurontin   Anxiety and Major Depression/Fibromyalgia/ chronic pain syndrome/ insomnia  Continue Xanax  Unfortunately cannot discontinue or hold several of her psych medications, to avoid rebound symptoms, for now we will continue BuSpar, Effexor and Zoloft Continue Neurontin Continue Lidoderm and home oxycodone dose, try to minimize narcotics  Continue Restoril at night if patient not AMS or lethargy   Restless leg syndrome Continue Mirapex  OSA Continue CPAP nightly    DVT prophylaxis:  Coumadin per Pharmacy Code Status:    Full  Family Communication:  Discussed with patient Disposition Plan: Expect patient to be discharged to home after condition improves Consults called:    None  Admission status: IP SDU   Sharene Butters, PA-C [facility]   Amion text  [phone]   09/03/2017, 7:22 AM

## 2017-09-03 NOTE — ED Triage Notes (Addendum)
[facility], pt from home with c/o of multiple syncopal episodes w/o orthostatic changes. Pt does have a hx of Afib, per EMS pt went in and out of Afib in route. Pt is being treated for possible diverticulitis. Antibiotics making CGB elevated, told to use sliding scale insulin. Hx of diabetes. Pt also reports a "fall" with her husband lowering her to the ground. Reports R hip and knee pain.

## 2017-09-04 DIAGNOSIS — M797 Fibromyalgia: Secondary | ICD-10-CM

## 2017-09-04 DIAGNOSIS — H40033 Anatomical narrow angle, bilateral: Secondary | ICD-10-CM

## 2017-09-04 DIAGNOSIS — Z7901 Long term (current) use of anticoagulants: Secondary | ICD-10-CM

## 2017-09-04 DIAGNOSIS — F329 Major depressive disorder, single episode, unspecified: Secondary | ICD-10-CM

## 2017-09-04 DIAGNOSIS — I5032 Chronic diastolic (congestive) heart failure: Secondary | ICD-10-CM

## 2017-09-04 DIAGNOSIS — G4733 Obstructive sleep apnea (adult) (pediatric): Secondary | ICD-10-CM

## 2017-09-04 DIAGNOSIS — R55 Syncope and collapse: Secondary | ICD-10-CM

## 2017-09-04 DIAGNOSIS — F419 Anxiety disorder, unspecified: Secondary | ICD-10-CM

## 2017-09-04 DIAGNOSIS — E876 Hypokalemia: Secondary | ICD-10-CM

## 2017-09-04 DIAGNOSIS — Z87898 Personal history of other specified conditions: Secondary | ICD-10-CM

## 2017-09-04 DIAGNOSIS — G2581 Restless legs syndrome: Secondary | ICD-10-CM

## 2017-09-04 DIAGNOSIS — I482 Chronic atrial fibrillation: Secondary | ICD-10-CM

## 2017-09-04 DIAGNOSIS — I10 Essential (primary) hypertension: Secondary | ICD-10-CM

## 2017-09-04 LAB — CBC WITH DIFFERENTIAL/PLATELET
Abs Immature Granulocytes: 0.1 10*3/uL (ref 0.0–0.1)
BASOS PCT: 0 %
Basophils Absolute: 0 10*3/uL (ref 0.0–0.1)
EOS PCT: 2 %
Eosinophils Absolute: 0.2 10*3/uL (ref 0.0–0.7)
HEMATOCRIT: 38.9 % (ref 36.0–46.0)
Hemoglobin: 11.8 g/dL — ABNORMAL LOW (ref 12.0–15.0)
IMMATURE GRANULOCYTES: 1 %
LYMPHS ABS: 1.6 10*3/uL (ref 0.7–4.0)
Lymphocytes Relative: 15 %
MCH: 24.9 pg — AB (ref 26.0–34.0)
MCHC: 30.3 g/dL (ref 30.0–36.0)
MCV: 82.2 fL (ref 78.0–100.0)
MONOS PCT: 9 %
Monocytes Absolute: 0.9 10*3/uL (ref 0.1–1.0)
Neutro Abs: 7.6 10*3/uL (ref 1.7–7.7)
Neutrophils Relative %: 73 %
PLATELETS: 179 10*3/uL (ref 150–400)
RBC: 4.73 MIL/uL (ref 3.87–5.11)
RDW: 16.9 % — ABNORMAL HIGH (ref 11.5–15.5)
WBC: 10.4 10*3/uL (ref 4.0–10.5)

## 2017-09-04 LAB — BASIC METABOLIC PANEL
ANION GAP: 6 (ref 5–15)
BUN: 20 mg/dL (ref 8–23)
CALCIUM: 8.5 mg/dL — AB (ref 8.9–10.3)
CO2: 33 mmol/L — ABNORMAL HIGH (ref 22–32)
CREATININE: 0.99 mg/dL (ref 0.44–1.00)
Chloride: 95 mmol/L — ABNORMAL LOW (ref 98–111)
GFR calc Af Amer: >60 mL/min (ref >60)
GFR, EST NON AFRICAN AMERICAN: 58 mL/min — AB (ref >60)
Glucose, Bld: 235 mg/dL — ABNORMAL HIGH (ref 70–99)
Potassium: 3.4 mmol/L — ABNORMAL LOW (ref 3.5–5.1)
SODIUM: 134 mmol/L — AB (ref 135–145)

## 2017-09-04 LAB — MAGNESIUM: Magnesium: 2.3 mg/dL (ref 1.7–2.4)

## 2017-09-04 LAB — PHOSPHORUS: Phosphorus: 2 mg/dL — ABNORMAL LOW (ref 2.5–4.6)

## 2017-09-04 LAB — LIPID PANEL
CHOLESTEROL: 103 mg/dL (ref 0–200)
HDL: 34 mg/dL — ABNORMAL LOW (ref >40)
LDL Cholesterol: 54 mg/dL (ref 0–99)
Total CHOL/HDL Ratio: 3 RATIO
Triglycerides: 73 mg/dL (ref <150)
VLDL: 15 mg/dL (ref 0–40)

## 2017-09-04 LAB — GLUCOSE, CAPILLARY
GLUCOSE-CAPILLARY: 199 mg/dL — AB (ref 70–99)
GLUCOSE-CAPILLARY: 191 mg/dL — AB (ref 70–99)
GLUCOSE-CAPILLARY: 220 mg/dL — AB (ref 70–99)
Glucose-Capillary: 190 mg/dL — ABNORMAL HIGH (ref 70–99)

## 2017-09-04 LAB — PROTIME-INR
INR: 2.57
PROTHROMBIN TIME: 27.4 s — AB (ref 11.4–15.2)

## 2017-09-04 MED ORDER — CALCIUM CARBONATE-VITAMIN D 500-200 MG-UNIT PO TABS
1.0000 | ORAL_TABLET | Freq: Every day | ORAL | Status: DC
  Administered 2017-09-04 – 2017-09-07 (×4): 1 via ORAL
  Filled 2017-09-04 (×4): qty 1

## 2017-09-04 MED ORDER — METOPROLOL SUCCINATE ER 50 MG PO TB24
50.0000 mg | ORAL_TABLET | Freq: Every day | ORAL | Status: DC
  Administered 2017-09-05 – 2017-09-07 (×3): 50 mg via ORAL
  Filled 2017-09-04 (×3): qty 1

## 2017-09-04 MED ORDER — WARFARIN SODIUM 7.5 MG PO TABS
7.5000 mg | ORAL_TABLET | Freq: Once | ORAL | Status: AC
  Administered 2017-09-04: 7.5 mg via ORAL
  Filled 2017-09-04: qty 1

## 2017-09-04 NOTE — Evaluation (Addendum)
Occupational Therapy Evaluation Patient Details Name: Donna [name] MRN: [id number] DOB: [birth year] Today's Date: 09/04/2017    History of Present Illness 67 y.o. female with a Past Medical History of RLS; depression/anxiety; fibromyalgia; DM; afib on Coumadin; and chronic diastolic heart failure who presents with syncope leading to a fall. Dx with syncope, in the setting of volume loss due to diuretics and recent diarrhea, severe hypokalemia, chronic atrial fibrillation and diet.    Clinical Impression   This 67 y/o female presents with the above. At baseline pt reports mod independence with ADLs and functional mobility short distances using RW, uses w/c for longer distances. Pt presenting with pain, generalized weakness and decreased activity tolerance. Pt presenting supine in bed this session reports having been up to chair earlier today, with increased fatigue and intermittently dozing off during session, requiring cues to remain awake/alert. Per PT note pt requiring +2 assist for bed mobility and transfers OOB. Currently requires min-modA for bed level UB ADL, max-totalA for LB and toileting ADLs. Pt participating in UB/LB exercises at bed level during this session. Will benefit from continued acuate OT services and recommend follow up therapy services in SNF setting after discharge to maximize her safety and independence with ADLs and functional mobility.     Follow Up Recommendations  SNF;Supervision/Assistance - 24 hour    Equipment Recommendations  None recommended by OT           Precautions / Restrictions Precautions Precautions: Fall Precaution Comments: watch HR Restrictions Weight Bearing Restrictions: No             ADL either performed or assessed with clinical judgement   ADL Overall ADL's : Needs assistance/impaired Eating/Feeding: Set up;Bed level   Grooming: Set up;Bed level                                 General ADL Comments: pt currently  requiring max-totalA for ADL completion at this time; pt agreeable to attempt sitting EOB though tearful when attempting to move LLE to initiate movement, also concerns for safety without +2 assist as pt with waxing/waning level of alertness. Pt's spouse present and requesting pt remain in bed. Pt completing bed level UB/LB exercise during session      Vision         Perception     Praxis      Pertinent Vitals/Pain Pain Assessment: Faces Faces Pain Scale: Hurts worst Pain Location: LLE with mobility  Pain Descriptors / Indicators: Crying;Grimacing;Guarding Pain Intervention(s): Monitored during session;Premedicated before session;Repositioned     Hand Dominance     Extremity/Trunk Assessment Upper Extremity Assessment Upper Extremity Assessment: Generalized weakness   Lower Extremity Assessment Lower Extremity Assessment: Defer to PT evaluation       Communication Communication Communication: No difficulties   Cognition Arousal/Alertness: Lethargic;Suspect due to medications;Awake/alert(waxing/waning level of alertness ) Behavior During Therapy: Anxious Overall Cognitive Status: Impaired/Different from baseline Area of Impairment: Attention;Following commands                   Current Attention Level: Sustained   Following Commands: Follows one step commands with increased time       General Comments: pt appears to be accurate historian though requires cues from husband for answering some questions; easily distracted; pt also intermittently dozing off due to pain meds requiring cues to remain awake    General Comments       Exercises  General Exercises - Upper Extremity Shoulder Flexion: AROM;AAROM;10 reps;Both;Supine Shoulder Extension: AROM;AAROM;10 reps;Both;Supine General Exercises - Lower Extremity Ankle Circles/Pumps: AROM;Both;Seated Straight Leg Raises: AROM;Right;10 reps;Supine   Shoulder Instructions      Home Living Family/patient expects  to be discharged to:: Private residence Living Arrangements: Spouse/significant other Available Help at Discharge: Family;Available 24 hours/day Type of Home: Mobile home Home Access: Stairs to enter Entrance Stairs-Number of Steps: 3 Entrance Stairs-Rails: Can reach both Home Layout: One level     Bathroom Shower/Tub: Tub/shower unit(has a cutout for smaller step into tub )   Bathroom Toilet: Handicapped height     Home Equipment: Bedside commode;Grab bars - toilet;Grab bars - tub/shower;Walker - 2 wheels;Hand held shower head;Wheelchair - Brewing technologist;Adaptive equipment Adaptive Equipment: Tax inspector;Other (Comment) Additional Comments: toilet aide       Prior Functioning/Environment Level of Independence: Needs assistance  Gait / Transfers Assistance Needed: limited household ambulation using RW ADL's / Homemaking Assistance Needed: husband does shopping, reports independent in ADLs with use of AE             OT Problem List: Decreased strength;Impaired balance (sitting and/or standing);Decreased activity tolerance;Decreased cognition;Pain      OT Treatment/Interventions: Self-care/ADL training;DME and/or AE instruction;Therapeutic activities;Balance training;Therapeutic exercise;Patient/family education;Cognitive remediation/compensation    OT Goals(Current goals can be found in the care plan section) Acute Rehab OT Goals Patient Stated Goal: stop hurting; regain independence  OT Goal Formulation: With patient Time For Goal Achievement: 09/18/17 Potential to Achieve Goals: Good  OT Frequency: Min 2X/week   Barriers to D/C:            Co-evaluation              AM-PAC PT "6 Clicks" Daily Activity     Outcome Measure Help from another person eating meals?: None Help from another person taking care of personal grooming?: A Little Help from another person toileting, which includes using toliet, bedpan, or urinal?: Total Help from another person  bathing (including washing, rinsing, drying)?: A Lot Help from another person to put on and taking off regular upper body clothing?: A Lot Help from another person to put on and taking off regular lower body clothing?: Total 6 Click Score: 13   End of Session Nurse Communication: Mobility status  Activity Tolerance: Patient limited by pain;Patient limited by lethargy Patient left: in bed;with call bell/phone within reach;with nursing/sitter in room;with family/visitor present  OT Visit Diagnosis: Muscle weakness (generalized) (M62.81)                Time: 4944-9675 OT Time Calculation (min): 23 min Charges:  OT General Charges $OT Visit: 1 Visit OT Evaluation $OT Eval Moderate Complexity: 1 Mod G-Codes:     Lou Cal, OT Pager [phone] 09/04/2017   Raymondo Band 09/04/2017, 4:28 PM

## 2017-09-04 NOTE — NC FL2 (Signed)
[place] LEVEL OF CARE SCREENING TOOL     IDENTIFICATION  Patient Name: Donna [name] Birthdate: June 20, 1950 Sex: female Admission Date (Current Location): 09/03/2017  [facility] and [place] Number:  Herbalist and Address:  [facility], [place] [address], [place], [place] [address]      Provider Number: [id number]  Attending Physician Name and Address:  Kerney Elbe, DO  Relative Name and Phone Number:  Margarita Croke, [phone]    Current Level of Care: Hospital Recommended Level of Care: [place] Prior Approval Number:    Date Approved/Denied:   PASRR Number:    Discharge Plan: SNF    Current Diagnoses: Patient Active Problem List   Diagnosis Date Noted  . Syncope 09/03/2017  . History of seizures 09/03/2017  . Hypokalemia due to loss of potassium 09/03/2017  . Depression 02/09/2017  . Anxiety 02/09/2017  . Venous stasis of both lower extremities 07/25/2016  . Essential hypertension 07/25/2016  . Anticoagulant long-term use 07/15/2016  . Anatomical narrow angle borderline glaucoma of both eyes 04/02/2016  . Chronic diastolic heart failure ([place]) 02/08/2016  . Chronic atrial fibrillation ([place]): CHA2DS2-VASc Score 5; on Warfarin 02/08/2016  . Morbid obesity ([place]) 02/08/2016  . OSA (obstructive sleep apnea) 02/08/2016  . Pre-operative cardiovascular examination 02/08/2016  . Medication management 02/08/2016  . Fibromyalgia 08/07/2012  . Restless legs syndrome 08/07/2012    Orientation RESPIRATION BLADDER Height & Weight     Time, Self, Situation, Place  O2(2L) External catheter, Incontinent Weight: 282 lb 10.1 oz (128.2 kg) Height:  5\' 11"  (180.3 cm)  BEHAVIORAL SYMPTOMS/MOOD NEUROLOGICAL BOWEL NUTRITION STATUS      Continent Diet(see dc summary)  AMBULATORY STATUS COMMUNICATION OF NEEDS Skin   Extensive Assist Verbally                         Personal Care Assistance Level of Assistance   Bathing, Feeding, Dressing Bathing Assistance: Limited assistance(mod assisst) Feeding assistance: Independent Dressing Assistance: Limited assistance(mod assist)     Functional Limitations Info  Hearing, Sight, Speech Sight Info: Adequate Hearing Info: Adequate Speech Info: Adequate    SPECIAL CARE FACTORS FREQUENCY  PT (By licensed PT), OT (By licensed OT)     PT Frequency: 5x wk OT Frequency: 5x wk            Contractures Contractures Info: Not present    Additional Factors Info  Allergies, Code Status Code Status Info: full code Allergies Info: BEE VENOM, FENTANYL, METFORMIN, METFORMIN AND RELATED, METFORMIN HCL, OTHER, ALTEPLASE, CEFEPIME            Current Medications (09/04/2017):  This is the current hospital active medication list Current Facility-Administered Medications  Medication Dose Route Frequency Provider Last Rate Last Dose  . 0.9 % NaCl with KCl 40 mEq / L  infusion   Intravenous Continuous Karmen Bongo, MD 100 mL/hr at 09/04/17 1251 100 mL/hr at 09/04/17 1251  . acetaminophen (TYLENOL) tablet 650 mg  650 mg Oral Q6H PRN Sharene Butters E, PA-C       Or  . acetaminophen (TYLENOL) suppository 650 mg  650 mg Rectal Q6H PRN Rondel Jumbo, PA-C      . ALPRAZolam Duanne Moron) tablet 0.25 mg  0.25 mg Oral TID Rondel Jumbo, PA-C   0.25 mg at 09/04/17 0934  . aspirin chewable tablet 81 mg  81 mg Oral Daily Rondel Jumbo, [place]   81  mg at 09/04/17 0931  . busPIRone (BUSPAR) tablet 10 mg  10 mg Oral Daily Rondel Jumbo, PA-C   10 mg at 09/04/17 0932  . calcium-vitamin D (OSCAL WITH D) 500-200 MG-UNIT per tablet 1 tablet  1 tablet Oral Q breakfast Karmen Bongo, MD   1 tablet at 09/04/17 [phone]  . cholecalciferol (VITAMIN D) tablet 4,000 Units  4,000 Units Oral Daily Rondel Jumbo, PA-C   4,000 Units at 09/04/17 1478  . ezetimibe (ZETIA) tablet 10 mg  10 mg Oral Daily Rondel Jumbo, PA-C   10 mg at 09/04/17 0932  . ferrous sulfate tablet 325 mg  325 mg  Oral See admin instructions Rondel Jumbo, PA-C      . gabapentin (NEURONTIN) tablet 600 mg  600 mg Oral TID Sharene Butters E, PA-C   600 mg at 09/04/17 0933  . guaiFENesin (MUCINEX) 12 hr tablet 600 mg  600 mg Oral BID PRN Rondel Jumbo, PA-C      . HYDROcodone-acetaminophen (NORCO/VICODIN) 5-325 MG per tablet 1-2 tablet  1-2 tablet Oral Q4H PRN Rondel Jumbo, PA-C   2 tablet at 09/04/17 1115  . insulin aspart (novoLOG) injection 0-15 Units  0-15 Units Subcutaneous TID WC Rondel Jumbo, PA-C   3 Units at 09/04/17 1139  . insulin detemir (LEVEMIR) injection 66 Units  66 Units Subcutaneous QHS Rondel Jumbo, [place]   66 Units at 09/03/17 2134  . lidocaine (LIDODERM) 5 % 1 patch  1 patch Transdermal Daily PRN Rondel Jumbo, PA-C      . magnesium oxide (MAG-OX) tablet 400 mg  400 mg Oral TID Sharene Butters E, PA-C   400 mg at 09/04/17 0933  . Melatonin TABS 1.5 mg  1.5 mg Oral QHS PRN Rondel Jumbo, PA-C      . morphine 4 MG/ML injection 0.52 mg  0.52 mg Intravenous Q4H PRN Rondel Jumbo, PA-C   0.52 mg at 09/04/17 1336  . potassium chloride SA (K-DUR,KLOR-CON) CR tablet 40 mEq  40 mEq Oral BID Rondel Jumbo, PA-C   40 mEq at 09/04/17 0933  . pramipexole (MIRAPEX) tablet 0.125 mg  0.125 mg Oral Daily Rondel Jumbo, PA-C   0.125 mg at 09/04/17 0931  . promethazine (PHENERGAN) tablet 12.5 mg  12.5 mg Oral Q6H PRN Rondel Jumbo, PA-C      . sertraline (ZOLOFT) tablet 100 mg  100 mg Oral Daily Rondel Jumbo, PA-C   100 mg at 09/04/17 0934  . sodium chloride flush (NS) 0.9 % injection 3 mL  3 mL Intravenous Q12H Rondel Jumbo, PA-C   3 mL at 09/04/17 0934  . venlafaxine XR (EFFEXOR-XR) 24 hr capsule 150 mg  150 mg Oral Q breakfast Rondel Jumbo, PA-C   150 mg at 09/04/17 2956  . warfarin (COUMADIN) tablet 7.5 mg  7.5 mg Oral [address] [place]      . Warfarin - Pharmacist Dosing Inpatient   Does not apply [place], [facility]         Discharge  Medications: Please see discharge summary for a list of discharge medications.  Relevant Imaging Results:  Relevant Lab Results:   Additional Information SS# [id number] cpap at night (medium, full face mask 5-20)   Wende Neighbors, LCSW

## 2017-09-04 NOTE — Clinical Social Work Note (Signed)
Clinical Social Work Assessment  Patient Details  Name: Donna [name] MRN: [id number] Date of Birth: Jun 05, 1950  Date of referral:  09/04/17               Reason for consult:  Discharge Planning, Facility Placement                Permission sought to share information with:  Family Supports Permission granted to share information::  Yes, Verbal Permission Granted  Name::     Juelle Dickmann  Agency::  snf  Relationship::  [phone]  Contact Information:  spouse  Housing/Transportation Living arrangements for the past 2 months:  Single Family Home Source of Information:  Patient, Spouse Patient Interpreter Needed:  None Criminal Activity/Legal Involvement Pertinent to Current Situation/Hospitalization:  No - Comment as needed Significant Relationships:  Spouse, Adult Children, Other Family Members Lives with:  Spouse Do you feel safe going back to the place where you live?  Yes Need for family participation in patient care:  Yes (Comment)  Care giving concerns: Patient lives with husband at home. Patient has support from spouse and from adult children. Patient stated she has been to a rehab facility in the past and is agreeable to discharge back to a rehab facility if needed. Patient stated she understands that with her conditions her spouse will be unable to provide the care she will need at this time    Social Worker assessment / plan:  Patients son and spouse was at bedside during conversation. Patients spouse Wille Glaser stated he has always taken care of patient but understands that rehab would be more beneficial  for patient. Joe stated he would like patient to return to [facility] since she has been there in the past. Wille Glaser stated he understands that they might not take her back because of all the care she is requiring. Family open to new facility if [facility] is unable to take patient.  Employment status:  Retired Forensic scientist:  Medicare PT Recommendations:  [place] / Referral to community resources:  [place]  Patient/Family's Response to care: Family supportive of patient and needs.  Patient/Family's Understanding of and Emotional Response to Diagnosis, Current Treatment, and Prognosis:  Family agreeable with discharge plan to snf  Emotional Assessment Appearance:  Appears stated age Attitude/Demeanor/Rapport:  Lethargic Affect (typically observed):  Accepting Orientation:  Oriented to Self, Oriented to Situation, Oriented to Place, Oriented to  Time Alcohol / Substance use:  Not Applicable Psych involvement (Current and /or in the community):     Discharge Needs  Concerns to be addressed:  Care Coordination Readmission within the last 30 days:  No Current discharge risk:  Dependent with Mobility Barriers to Discharge:  Continued Medical Work up   [employer], LCSW 09/04/2017, 3:20 PM

## 2017-09-04 NOTE — Progress Notes (Signed)
[place] for Warfarin Indication: atrial fibrillation  Allergies  Allergen Reactions  . Bee Venom Hives    Takes benadryl  . Fentanyl Other (See Comments)    Hallucinations, syncope    . Metformin Diarrhea  . Metformin And Related Diarrhea  . Metformin Hcl Diarrhea  . Other Other (See Comments)    Raw foods with seeds give her "boils".  Avoids raw strawberries, blueberries. Tolerates cooked fruits, tomato sauce, bread/grains with seeds. [facility] 10/17/13 Berries with seeds  . Alteplase Rash  . Cefepime Rash    Patient Measurements: Height: 5\' 11"  (180.3 cm) Weight: 282 lb 10.1 oz (128.2 kg) IBW/kg (Calculated) : 70.8  Vital Signs: Temp: 99.5 F (37.5 C) (07/05 0535) Temp Source: Oral (07/05 0846) BP: 104/58 (07/05 0846) Pulse Rate: 114 (07/05 0846)  Labs: Recent Labs    09/03/17 0327 09/03/17 0330 09/03/17 0527  09/03/17 1356 09/03/17 2041 09/04/17 0236 09/04/17 0814  HGB 14.3  --   --   --  12.0  --   --  11.8*  HCT 45.9  --   --   --  38.3  --   --  38.9  PLT 250  --   --   --  212  --   --  179  LABPROT  --  24.6*  --   --   --   --  27.4*  --   INR  --  2.24  --   --   --   --  2.57  --   CREATININE 1.66*  --   --    < > 1.28* 1.14* 0.99  --   TROPONINI  --   --  <0.03  --   --   --   --   --    < > = values in this interval not displayed.    Estimated Creatinine Clearance: 82.8 mL/min (by C-G formula based on SCr of 0.99 mg/dL).  Assessment: 67 year old female on warfarin prior to admission for atrial fibrillation who presents to the ED this AM with syncopal fall. Patient denies hitting head or headache. No acute fractures on xray.    INR today is therapeutic at 2.57. CBC is stable, no bleeding noted.   PTA regimen: 7.5mg  po daily except 10mg  of Tuesday  Goal of Therapy:  INR 2-3 Monitor platelets by anticoagulation protocol: Yes   Plan:  Warfarin 7.5 mg PO tonight Daily PT/INR Monitor for s/sx of  bleeding Consider replacing low Phos   Renold Genta, PharmD, BCPS Clinical Pharmacist Clinical phone for 09/04/2017 until 3p is [phone] Please check AMION for all Pharmacist numbers by unit 09/04/2017 12:46 PM

## 2017-09-04 NOTE — Progress Notes (Signed)
PROGRESS NOTE    Donna [name]  LGX:[id number] DOB: 1950-11-05 DOA: 09/03/2017 PCP: Guadlupe Spanish, MD   Brief Narrative:  Donna [name] is a 67 y.o. female with a Past Medical History of RLS; depression/anxiety; fibromyalgia; DM; afib on Coumadin; and chronic diastolic heart failure who presented with syncope leading to a fall.  The patient reports having had about 3 weeks of diarrhea with fecal incontinence (she already had urinary incontinence); she went to her PCP and was diagnosed empirically with diverticulitis (no imaging performed) and given Cipro/Flagyl.  The diarrhea was mostly in the evenings and would consist of 4-5 episodes nightly  Around the same time, she began having pre-syncopal episodes where her vision would go dark but she was able to catch herself as she was falling.  Her diarrhea did improve some after starting the medications but over the last few days it has returned.  Overnight, she got up to use the bathroom.  This time, she fully lost consciousness and fell to the ground.  She has chronic pain and acute pain from the fall, but she does not c/o back pain.   Assessment & Plan:   Principal Problem:   Syncope Active Problems:   Chronic diastolic heart failure (HCC)   Chronic atrial fibrillation ([place]): CHA2DS2-VASc Score 5; on Warfarin   Morbid obesity (HCC)   OSA (obstructive sleep apnea)   Venous stasis of both lower extremities   Essential hypertension   Fibromyalgia   Depression   Anxiety   Anticoagulant long-term use   Anatomical narrow angle borderline glaucoma of both eyes   Restless legs syndrome   History of seizures   Hypokalemia due to loss of potassium  Syncope -Patient with recurrent presyncopal spells, but today's episode appears to be more c/w true syncope -The most likely etiology currently appears to be from her recurrent diarrhea and critical hypokalemia -Will monitor on telemetry in SDU for now -Orthostatic vital signs on admission and again  this AM  -She did not have any imaging of her brain; she reports stool and urinary incontinence as well as ataxia and some memory loss and so NPH is a consideration (she is extremely poorly conditioned and so there are other potential causes) - -Ordered non-contrast head CT and showed Chronic atrophic changes without acute abnormality. -2d echo performed and unremarkable with poor overall visualization; lower suspicion for structural heart disease as the etiology although conduction issues are a clear consideration given her markedly low K+ -C/w Neuro checks  -Checked A1c and ws 7.9, FLP as below, TSH normal at 0.777 -Continue ASA -PT/OT eval and treat and recommending SNF -C/w IVF hydration with NS but reduce rate to 75 mL/hr  Hypokalemia -Patient with marked hypokalemia but is improving and is now 3.4 -This is likely related to recurrent diarrhea and ongoing GI loss in hte setting of Lasix and Zaroxolyn use -Replete with po KCl 40 mEQ BID again today  -Will follow closely.  If she is having ongoing GI losses, more K+ repletion may be needed. -Mag given and will check Mag level.  -EKG with prolonged QT (611), so will need to avoid medications which can worsen this; repeat EKG q 6h for now to ensure this is improving with K+ repletion. -Repeat CMP in AM   Diarrhea -Uncertain etiology. -She had an empiric diagnosis of diverticulitis by her PCP recently (according to the patient) and was given Cipro/Flagyl with only transient improvement.  -Patient is being treated since August 26, 2017 with  Cipro and Flagyl.  No diarrhea on admission but had one bowel movement this AM -Afebrile, white count was 19.0 on admission and trending down (Likely some hemoconcentration as well) -Will continue home Cipro and Flagyl to complete her therapy  -Will order a CT of her abdomen with only PO contrast given her renal insufficiency. -CT Abd/Pelvis Normal -Consider stool studies including C diff based on CT  results and if she is having ongoing issues but will hold off for now  -This is likely to source of her hypokalemia and so finding the source of the diarrhea is important to preventing ongoing GI losses.  CAD -Last catheterization in September 2017, EKG atrial fibrillation, with LVH, with secondary repolarization abnormality, old inferior infarct, prolonged QT   -Tn less than 0.03  patient is cardiac pain free at this time. Continue ASA, high dose statin, and resume Metoprolol    Diastolic CHF:  -No acute decompensation, weight 276 lbs  Last 2D echo December 2017 EF 55 to 02%, normal systolic -Continue to Hold Lasix and Zaroxolyn due to above -Resume Home BB -Obtain daily weights and Monitor intake and output; Patient is +879 mL and weight is  -BNP was 137.8 -Continue with IV fluid hydration with normal saline to reduce rate to 75 mL's per hour -Continue to monitor volume status very carefully  Atrial Fibrillation  -CHA2DS2-VASc score 5 on anticoagulation with Coumadin PT 24.6, INR 2.24 -Resumed Metoprolol Succinate 50 mg po Daily  -Continue Coumadin per pharmacy dosing   Hypertension  -BP was on the softer Side but stable at 112/69 -Continue to Hold Furosemide 80 mg po BID, Metolazone 10 mg po PRN; Will resume Metoprolol Succinate 50 mg po Daily  Hyperlipidemia -Lipid Panel showed cholesterol level 103, HDL of 34, LDL 54, triglycerides of 73, VLDL of 15 -Continue Ezetimibe  Leukocytosis -Likely reactive, UA is Negative, patient had a history of recent diarrhea treated with Cipro and Flagyl, volume loss.  -Non toxic appearing . WBC 19 and trending down and improved to 10.4 -Patient is  Afebrile  Recheck CBC in a.m. -Will refrain from further antibiotic treatment for now, until patient is better hydrated, and if repeat CBC does not show improvement despite IV fluids -Continue to Monitor for S/Sx of Infection -Repeat CBC in AM   Type II Diabetes with neuropathy  -HbA1c  is 7.9 -C/w Insulin Detemir 66 units sq qHS and Moderate Novolog SSI AC -C/w Gabapentin 600 mg po TID   Anxiety and Major Depression/Fibromyalgia/ chronic pain syndrome/ insomnia  -Continue Alprazolam 0.25 mg TID daily   -For now we will continue BuSpirone 10 mg po daily, Venalafaxine XR 150 mg po Daily, and Sertraline 100 mg po Daily  -Continue Gabapentin 600 mg po TID -Continue Lidocaine 5% 1 Patch and home Hydrocodone-Acetaminophen 1-2 tab po q4hprn -Try to minimize narcotics   Restless Leg Syndrome -Continue Pramipexole 0.125 mg po Daily   OSA -Continue CPAP nightly  DVT prophylaxis: Anticoagulated with Coumadin  Code Status: FULL CODE Family Communication: No family present at bedside  Disposition Plan: SNF when medically stable for D/C  Consultants:   None  Procedures:   ECHOCARDIOGRAM ------------------------------------------------------------------- Study Conclusions  - Left ventricle: The cavity size was normal. Wall thickness was   increased in a pattern of mild LVH. Systolic function was normal.   The estimated ejection fraction was in the range of 55% to 60%.   Images were inadequate for LV wall motion assessment.   Indeterminate diastolic function. - Aortic valve:  Mildly to moderately calcified annulus. Trileaflet;   mildly calcified leaflets. There was very mild stenosis. Mean   gradient (S): 9 mm Hg. Peak gradient (S): 16 mm Hg. VTI ratio of   LVOT to aortic valve: 0.48. Valve area (VTI): 1.83 cm^2. Valve   area (Vmax): 1.76 cm^2. Valve area (Vmean): 2 cm^2. - Mitral valve: Moderately calcified annulus. There was trivial   regurgitation. - Left atrium: The atrium was moderately dilated. - Right atrium: Central venous pressure (est): 3 mm Hg. - Atrial septum: No defect or patent foramen ovale was identified. - Tricuspid valve: There was trivial regurgitation. - Pulmonary arteries: Systolic pressure could not be accurately   estimated. -  Pericardium, extracardiac: There was no pericardial effusion.   Antimicrobials:  Anti-infectives (From admission, onward)   Start     Dose/Rate Route Frequency Ordered Stop   09/03/17 0800  ciprofloxacin (CIPRO) tablet 500 mg  Status:  Discontinued     500 mg Oral 2 times daily 09/03/17 0722 09/03/17 1011   09/03/17 0745  metroNIDAZOLE (FLAGYL) tablet 500 mg  Status:  Discontinued     500 mg Oral Every 6 hours 09/03/17 0722 09/03/17 1011     Subjective: Examined this morning states she only had one bowel movement and states that it is somewhat formed today but still had some watery elements to it.  No chest pain, shortness breath, nausea, vomiting.  States she still feels weak.  No other complaints or concerns at this time.  Objective: Vitals:   09/04/17 0535 09/04/17 0846 09/04/17 1445 09/04/17 1632  BP: 98/84 (!) 104/58 102/81 112/69  Pulse: (!) 120 (!) 114 (!) 116 (!) 110  Resp: 20 20 20 20   Temp: 99.5 F (37.5 C)  97.9 F (36.6 C) 98.6 F (37 C)  TempSrc: Oral Oral Oral Oral  SpO2: 93% 92% 96% 96%  Weight:      Height:        Intake/Output Summary (Last 24 hours) at 09/04/2017 1757 Last data filed at 09/04/2017 1448 Gross per 24 hour  Intake 1846.67 ml  Output 1700 ml  Net 146.67 ml   Filed Weights   09/03/17 2010  Weight: 128.2 kg (282 lb 10.1 oz)   Examination: Physical Exam:  Constitutional: WN/WD obese Caucasian female in NAD and appears calm and comfortable Eyes: PERRL, lids and conjunctivae normal, sclerae anicteric  ENMT: External Ears, Nose appear normal. Grossly normal hearing.  Neck: Appears normal, supple, no cervical masses, normal ROM, no appreciable thyromegaly, no JVD Respiratory: Diminished to auscultation bilaterally, no wheezing, rales, rhonchi or crackles. Normal respiratory effort and patient is not tachypenic. No accessory muscle use.  Cardiovascular: RRR, no murmurs / rubs / gallops. S1 and S2 auscultated.  Abdomen: Soft, non-tender, Distended  2/2 to body habitus. No masses palpated. No appreciable hepatosplenomegaly. Bowel sounds positive x4.  GU: Deferred. Musculoskeletal: No clubbing / cyanosis of digits/nails. Good ROM, no contractures. Normal strength and muscle tone.  Skin: Had some bruising on arms. No induration; Warm and dry.  Neurologic: CN 2-12 grossly intact with no focal deficits. Romberg sign and cerebellar reflexes not assessed.  Psychiatric: Normal judgment and insight. Alert and oriented x 3. Euphoric mood and bizarre affect.   Data Reviewed: I have personally reviewed following labs and imaging studies  CBC: Recent Labs  Lab 09/03/17 0327 09/03/17 1356 09/04/17 0814  WBC 19.0* 14.7* 10.4  NEUTROABS  --   --  7.6  HGB 14.3 12.0 11.8*  HCT 45.9 38.3 38.9  MCV 81.4 81.5 82.2  PLT 250 212 341   Basic Metabolic Panel: Recent Labs  Lab 09/03/17 0327 09/03/17 0930 09/03/17 1356 09/03/17 2041 09/04/17 0236 09/04/17 0814  NA 133* 134* 134* 132* 134*  --   K <2.0* 2.1* 2.8* 2.6* 3.4*  --   CL 84* 90* 90* 92* 95*  --   CO2 32 29 32 29 33*  --   GLUCOSE 389* 312* 242* 332* 235*  --   BUN 36* 32* 30* 25* 20  --   CREATININE 1.66* 1.32* 1.28* 1.14* 0.99  --   CALCIUM 9.4 8.4* 8.6* 8.4* 8.5*  --   MG  --   --  2.5*  --   --  2.3  PHOS  --   --   --   --   --  2.0*   GFR: Estimated Creatinine Clearance: 82.8 mL/min (by C-G formula based on SCr of 0.99 mg/dL). Liver Function Tests: No results for input(s): AST, ALT, ALKPHOS, BILITOT, PROT, ALBUMIN in the last 168 hours. No results for input(s): LIPASE, AMYLASE in the last 168 hours. No results for input(s): AMMONIA in the last 168 hours. Coagulation Profile: Recent Labs  Lab 09/03/17 0330 09/04/17 0236  INR 2.24 2.57   Cardiac Enzymes: Recent Labs  Lab 09/03/17 0527  TROPONINI <0.03   BNP (last 3 results) No results for input(s): PROBNP in the last 8760 hours. HbA1C: Recent Labs    09/03/17 0327  HGBA1C 7.9*   CBG: Recent Labs  Lab  09/03/17 1728 09/03/17 2104 09/04/17 0633 09/04/17 1131 09/04/17 1630  GLUCAP 232* 316* 190* 199* 191*   Lipid Profile: Recent Labs    09/04/17 0236  CHOL 103  HDL 34*  LDLCALC 54  TRIG 73  CHOLHDL 3.0   Thyroid Function Tests: Recent Labs    09/03/17 0930  TSH 0.777   Anemia Panel: No results for input(s): VITAMINB12, FOLATE, FERRITIN, TIBC, IRON, RETICCTPCT in the last 72 hours. Sepsis Labs: No results for input(s): PROCALCITON, LATICACIDVEN in the last 168 hours.  No results found for this or any previous visit (from the past 240 hour(s)).   Radiology Studies: Ct Abdomen Pelvis Wo Contrast  Result Date: 09/03/2017 CLINICAL DATA:  Diarrhea and recent syncopal episode EXAM: CT ABDOMEN AND PELVIS WITHOUT CONTRAST TECHNIQUE: Multidetector CT imaging of the abdomen and pelvis was performed following the standard protocol without IV contrast. COMPARISON:  03/19/2015 FINDINGS: Lower chest: Lung bases are free of acute infiltrate or sizable effusion. Hepatobiliary: No focal liver abnormality is seen. Status post cholecystectomy. No biliary dilatation. Pancreas: Unremarkable. No pancreatic ductal dilatation or surrounding inflammatory changes. Spleen: Normal in size without focal abnormality. Adrenals/Urinary Tract: Adrenal glands are within normal limits. Renal cystic change is noted. No renal calculi or obstructive changes are seen. The bladder is partially distended. Stomach/Bowel: Minimal diverticular change of the colon is noted. The appendix is not well visualized and may have been surgically removed. No inflammatory changes to suggest appendicitis are noted. Small bowel is within normal limits. Vascular/Lymphatic: Aortic atherosclerosis. No enlarged abdominal or pelvic lymph nodes. Reproductive: Status post hysterectomy. No adnexal masses. Other: No abdominal wall hernia or abnormality. No abdominopelvic ascites. Musculoskeletal: Right hip replacement is noted. Degenerative changes  of the lumbar spine are seen. IMPRESSION: No acute abnormality identified. Electronically Signed   By: Inez Catalina M.D.   On: 09/03/2017 12:59   Ct Head Wo Contrast  Result Date: 09/03/2017 CLINICAL DATA:  Recent syncopal episode EXAM: CT HEAD  WITHOUT CONTRAST TECHNIQUE: Contiguous axial images were obtained from the base of the skull through the vertex without intravenous contrast. COMPARISON:  01/17/2015 FINDINGS: Brain: No evidence of acute infarction, hemorrhage, hydrocephalus, extra-axial collection or mass lesion/mass effect. Mild atrophic changes are noted. Vascular: No hyperdense vessel or unexpected calcification. Skull: Normal. Negative for fracture or focal lesion. Sinuses/Orbits: No acute finding. Other: None. IMPRESSION: Chronic atrophic changes without acute abnormality. Electronically Signed   By: Inez Catalina M.D.   On: 09/03/2017 12:56   Dg Pelvis Portable  Result Date: 09/03/2017 CLINICAL DATA:  67 y/o F; multiple syncopal episodes with fall. Right hip pain and knee pain. EXAM: PORTABLE PELVIS 1-2 VIEWS COMPARISON:  03/19/2015 CT abdomen and pelvis. FINDINGS: There is no evidence of pelvic fracture or diastasis. No pelvic bone lesions are seen. Right total hip arthroplasty partially visualized. IMPRESSION: No acute fracture or dislocation identified. Electronically Signed   By: Kristine Garbe M.D.   On: 09/03/2017 04:37   Dg Chest Portable 1 View  Result Date: 09/03/2017 CLINICAL DATA:  Multiple syncopal episodes with orthostatic changes. Her fall. EXAM: PORTABLE CHEST 1 VIEW COMPARISON:  11/06/2015 FINDINGS: Heart size and pulmonary vascularity are normal for technique. Lungs are clear and expanded. No blunting of costophrenic angles. No pneumothorax. Mediastinal contours appear intact. Calcification of the aorta. IMPRESSION: No active disease. Electronically Signed   By: Lucienne Capers M.D.   On: 09/03/2017 04:40   Dg Knee Left Port  Result Date: 09/03/2017 CLINICAL DATA:   67 y/o F; multiple syncopal episodes and fall with knee pain. EXAM: PORTABLE LEFT KNEE - 1-2 VIEW COMPARISON:  None. FINDINGS: No evidence of fracture, dislocation, or joint effusion. Moderate narrowing of the medial femorotibial compartment with sclerosis of articular surfaces. Tricompartmental osteophytosis. Vascular calcifications. Bones are demineralized. IMPRESSION: 1.  No acute fracture or dislocation identified. 2. Moderate tricompartmental osteoarthrosis greatest in the medial femorotibial compartment. Electronically Signed   By: Kristine Garbe M.D.   On: 09/03/2017 04:43   Scheduled Meds: . ALPRAZolam  0.25 mg Oral TID  . aspirin  81 mg Oral Daily  . busPIRone  10 mg Oral Daily  . calcium-vitamin D  1 tablet Oral Q breakfast  . cholecalciferol  4,000 Units Oral Daily  . ezetimibe  10 mg Oral Daily  . ferrous sulfate  325 mg Oral See admin instructions  . gabapentin  600 mg Oral TID  . insulin aspart  0-15 Units Subcutaneous TID WC  . insulin detemir  66 Units Subcutaneous QHS  . magnesium oxide  400 mg Oral TID  . potassium chloride SA  40 mEq Oral BID  . pramipexole  0.125 mg Oral Daily  . sertraline  100 mg Oral Daily  . sodium chloride flush  3 mL Intravenous Q12H  . venlafaxine XR  150 mg Oral Q breakfast  . Warfarin - Pharmacist Dosing Inpatient   Does not apply q1800   Continuous Infusions: . 0.9 % NaCl with KCl 40 mEq / L 100 mL/hr (09/04/17 1251)    LOS: 1 day   Kerney Elbe, DO [facility] Pager [phone]  If 7PM-7AM, please contact night-coverage [email or web address] Password [facility] 09/04/2017, 5:57 PM

## 2017-09-04 NOTE — Progress Notes (Signed)
Physical Therapy Treatment Patient Details Name: Donna [name] MRN: [id number] DOB: 27-Feb-1951 Today's Date: 09/04/2017    History of Present Illness 67 y.o. female with a Past Medical History of RLS; depression/anxiety; fibromyalgia; DM; afib on Coumadin; and chronic diastolic heart failure who presents with syncope leading to a fall. Dx with syncope, in the setting of volume loss due to diuretics and recent diarrhea, severe hypokalemia, chronic atrial fibrillation and diet.     PT Comments    Patient progressing this session able to get OOB to [facility] and to recliner, but with significant R hip pain.  Concern for back issues as no imaging since her syncope and fall so used precautions with rolling in bed to come up to sit.  Feel she will benefit from SNF level rehab at d/c.  PT to follow acutely.   Follow Up Recommendations  SNF(prefers [facility])     Equipment Recommendations  None recommended by PT    Recommendations for Other Services       Precautions / Restrictions Precautions Precautions: Fall Precaution Comments: watch HR    Mobility  Bed Mobility Overal bed mobility: Needs Assistance Bed Mobility: Rolling;Sidelying to Sit Rolling: Mod assist;+2 for physical assistance Sidelying to sit: Mod assist       General bed mobility comments: increased time and assist to bend L knee and reach for railing with +2 for rolling with pad; then assist of 1 for legs off bed and lifting trunk with cues and increased time  Transfers Overall transfer level: Needs assistance Equipment used: Rolling walker (2 wheeled) Transfers: Sit to/from [facility] Sit to Stand: Mod assist;+2 physical assistance;From elevated surface Stand pivot transfers: Mod assist;+2 safety/equipment       General transfer comment: with RW and cues for hand placement assist for lifting from bed and able to scoot feet slowly to pivot with RW to [facility]; then assist to stand from [facility] with RN  performing hygiene and wound assessment in standing; switched BSC for recliner and pt sat with extreme pain in R hip  Ambulation/Gait             General Gait Details: unable at this time   Stairs             Wheelchair Mobility    Modified Rankin (Stroke Patients Only)       Balance Overall balance assessment: Needs assistance Sitting-balance support: Feet supported Sitting balance-Leahy Scale: Poor Sitting balance - Comments: min A several minutes sitting EOB then pt with UE support with pain and posterior bias   Standing balance support: Bilateral upper extremity supported Standing balance-Leahy Scale: Poor Standing balance comment: UE support and assist in standing with flexed posture                            Cognition Arousal/Alertness: Awake/alert Behavior During Therapy: Anxious Overall Cognitive Status: Impaired/Different from baseline                                 General Comments: oriented, distracted by pain, very slow to relate and difficulty recalling clinic where she had rehab recently after R TKA and repeating herself some; states "I think it is all the medicine"      Exercises      General Comments General comments (skin integrity, edema, etc.): HR up to 142 bpm with pain and standing  activities      Pertinent Vitals/Pain Pain Assessment: Faces Faces Pain Scale: Hurts whole lot Pain Location: R hip with mobility, L knee in bed, back Pain Descriptors / Indicators: Grimacing;Guarding;Moaning;Sharp Pain Intervention(s): Monitored during session;Repositioned;Relaxation;Limited activity within patient's tolerance    Home Living                      Prior Function            PT Goals (current goals can now be found in the care plan section) Progress towards PT goals: Progressing toward goals    Frequency    Min 2X/week      PT Plan Current plan remains appropriate    Co-evaluation               AM-PAC PT "6 Clicks" Daily Activity  Outcome Measure  Difficulty turning over in bed (including adjusting bedclothes, sheets and blankets)?: Unable Difficulty moving from lying on back to sitting on the side of the bed? : Unable Difficulty sitting down on and standing up from a chair with arms (e.g., wheelchair, bedside commode, etc,.)?: Unable Help needed moving to and from a bed to chair (including a wheelchair)?: A Lot Help needed walking in hospital room?: Total Help needed climbing 3-5 steps with a railing? : Total 6 Click Score: 7    End of Session Equipment Utilized During Treatment: Gait belt Activity Tolerance: Patient limited by pain Patient left: in bed;with call bell/phone within reach;in chair   PT Visit Diagnosis: Pain;Muscle weakness (generalized) (M62.81);Difficulty in walking, not elsewhere classified (R26.2) Pain - Right/Left: Right Pain - part of body: Hip     Time: 8588-5027 PT Time Calculation (min) (ACUTE ONLY): 35 min  Charges:  $Therapeutic Activity: 23-37 mins                    G CodesMagda Kiel, [place] [phone] 09/04/2017    Reginia Naas 09/04/2017, 10:54 AM

## 2017-09-05 ENCOUNTER — Inpatient Hospital Stay (HOSPITAL_COMMUNITY): Payer: Medicare Other

## 2017-09-05 LAB — CBC WITH DIFFERENTIAL/PLATELET
Abs Immature Granulocytes: 0.1 K/uL (ref 0.0–0.1)
Basophils Absolute: 0 K/uL (ref 0.0–0.1)
Basophils Relative: 0 %
Eosinophils Absolute: 0.2 K/uL (ref 0.0–0.7)
Eosinophils Relative: 3 %
HCT: 34.9 % — ABNORMAL LOW (ref 36.0–46.0)
Hemoglobin: 10.5 g/dL — ABNORMAL LOW (ref 12.0–15.0)
Immature Granulocytes: 1 %
Lymphocytes Relative: 20 %
Lymphs Abs: 1.7 K/uL (ref 0.7–4.0)
MCH: 25.1 pg — ABNORMAL LOW (ref 26.0–34.0)
MCHC: 30.1 g/dL (ref 30.0–36.0)
MCV: 83.5 fL (ref 78.0–100.0)
Monocytes Absolute: 0.6 K/uL (ref 0.1–1.0)
Monocytes Relative: 7 %
Neutro Abs: 5.9 K/uL (ref 1.7–7.7)
Neutrophils Relative %: 69 %
Platelets: 157 K/uL (ref 150–400)
RBC: 4.18 MIL/uL (ref 3.87–5.11)
RDW: 17 % — ABNORMAL HIGH (ref 11.5–15.5)
WBC: 8.5 K/uL (ref 4.0–10.5)

## 2017-09-05 LAB — COMPREHENSIVE METABOLIC PANEL WITH GFR
ALT: 11 U/L (ref 0–44)
AST: 16 U/L (ref 15–41)
Albumin: 2.7 g/dL — ABNORMAL LOW (ref 3.5–5.0)
Alkaline Phosphatase: 61 U/L (ref 38–126)
Anion gap: 6 (ref 5–15)
BUN: 10 mg/dL (ref 8–23)
CO2: 29 mmol/L (ref 22–32)
Calcium: 8.4 mg/dL — ABNORMAL LOW (ref 8.9–10.3)
Chloride: 102 mmol/L (ref 98–111)
Creatinine, Ser: 0.74 mg/dL (ref 0.44–1.00)
GFR calc Af Amer: >60 mL/min
GFR calc non Af Amer: >60 mL/min
Glucose, Bld: 169 mg/dL — ABNORMAL HIGH (ref 70–99)
Potassium: 4.3 mmol/L (ref 3.5–5.1)
Sodium: 137 mmol/L (ref 135–145)
Total Bilirubin: 0.9 mg/dL (ref 0.3–1.2)
Total Protein: 5.6 g/dL — ABNORMAL LOW (ref 6.5–8.1)

## 2017-09-05 LAB — GLUCOSE, CAPILLARY
GLUCOSE-CAPILLARY: 211 mg/dL — AB (ref 70–99)
Glucose-Capillary: 186 mg/dL — ABNORMAL HIGH (ref 70–99)
Glucose-Capillary: 192 mg/dL — ABNORMAL HIGH (ref 70–99)

## 2017-09-05 LAB — PROTIME-INR
INR: 2.79
Prothrombin Time: 29.2 s — ABNORMAL HIGH (ref 11.4–15.2)

## 2017-09-05 LAB — PHOSPHORUS: PHOSPHORUS: 1.9 mg/dL — AB (ref 2.5–4.6)

## 2017-09-05 LAB — MAGNESIUM: Magnesium: 2.1 mg/dL (ref 1.7–2.4)

## 2017-09-05 MED ORDER — METOPROLOL TARTRATE 5 MG/5ML IV SOLN
5.0000 mg | Freq: Once | INTRAVENOUS | Status: AC
  Administered 2017-09-05: 5 mg via INTRAVENOUS
  Filled 2017-09-05: qty 5

## 2017-09-05 MED ORDER — DICLOFENAC SODIUM 1 % TD GEL
2.0000 g | Freq: Four times a day (QID) | TRANSDERMAL | Status: DC
  Administered 2017-09-05 – 2017-09-07 (×8): 2 g via TOPICAL
  Filled 2017-09-05: qty 100

## 2017-09-05 MED ORDER — MORPHINE SULFATE (PF) 2 MG/ML IV SOLN: 1.0000 mg | INTRAVENOUS | Status: DC | PRN

## 2017-09-05 MED ORDER — K PHOS MONO-SOD PHOS DI & MONO 155-852-130 MG PO TABS
500.0000 mg | ORAL_TABLET | Freq: Two times a day (BID) | ORAL | Status: AC
  Administered 2017-09-05 (×2): 500 mg via ORAL
  Filled 2017-09-05 (×2): qty 2

## 2017-09-05 MED ORDER — MORPHINE SULFATE (PF) 2 MG/ML IV SOLN: 2.0000 mg | INTRAVENOUS | Status: DC | PRN

## 2017-09-05 MED ORDER — WARFARIN SODIUM 5 MG PO TABS
5.0000 mg | ORAL_TABLET | Freq: Once | ORAL | Status: AC
  Administered 2017-09-05: 5 mg via ORAL
  Filled 2017-09-05: qty 1

## 2017-09-05 NOTE — Plan of Care (Signed)
Care plans reviewed and patient is progressing.  

## 2017-09-05 NOTE — Progress Notes (Signed)
Donna Donna [name] for Warfarin Indication: atrial fibrillation  Allergies  Allergen Reactions  . Bee Venom Hives    Takes benadryl  . Fentanyl Other (See Comments)    Hallucinations, syncope    . Metformin Diarrhea  . Metformin And Related Diarrhea  . Metformin Hcl Diarrhea  . Other Other (See Comments)    Raw foods with seeds give her "boils".  Avoids raw strawberries, blueberries. Tolerates cooked fruits, tomato sauce, bread/grains with seeds. [facility] 10/17/13 Berries with seeds  . Alteplase Rash  . Cefepime Rash    Patient Measurements: Height: 5\' 11"  (180.3 cm) Weight: 283 lb 4.7 oz (128.5 kg) IBW/kg (Calculated) : 70.8  Vital Signs: Temp: 98.5 F (36.9 C) (07/06 0750) Temp Source: Oral (07/06 0750) BP: 99/61 (07/06 0750) Pulse Rate: 72 (07/06 0750)  Labs: Recent Labs    09/03/17 0330 09/03/17 0527  09/03/17 1356 09/03/17 2041 09/04/17 0236 09/04/17 0814 09/05/17 0341  HGB  --   --   --  12.0  --   --  11.8* 10.5*  HCT  --   --   --  38.3  --   --  38.9 34.9*  PLT  --   --   --  212  --   --  179 157  LABPROT 24.6*  --   --   --   --  27.4*  --  29.2*  INR 2.24  --   --   --   --  2.57  --  2.79  CREATININE  --   --    < > 1.28* 1.14* 0.99  --  0.74  TROPONINI  --  <0.03  --   --   --   --   --   --    < > = values in this interval not displayed.    Estimated Creatinine Clearance: 102.5 mL/min (by C-G formula based on SCr of 0.74 mg/dL).  Assessment: 67 year old Donna [name] on warfarin prior to admission for atrial fibrillation who presented to the ED on 7/5 with syncopal fall likely due to recurrent diarrhea. Patient denies hitting head or headache. No acute fractures on xray.    INR today is therapeutic at 2.79, but is uptrending. CBC is stable, no bleeding noted.   PTA regimen: 7.5mg  po daily except 10mg  of Tuesday  Goal of Therapy:  INR 2-3 Monitor platelets by anticoagulation protocol: Yes   Plan:  Warfarin 5 mg PO x1  tonight Daily INR Monitor for s/sx of bleeding   Jackson Latino, PharmD PGY1 Pharmacy Resident Phone [phone] 09/05/2017     11:37 AM

## 2017-09-05 NOTE — Progress Notes (Signed)
PROGRESS NOTE    Donna [name]  IRJ:[id number] DOB: Apr 09, 1950 DOA: 09/03/2017 PCP: Guadlupe Spanish, MD   Brief Narrative:  Donna [name] is a 67 y.o. female with a Past Medical History of RLS; depression/anxiety; fibromyalgia; DM; afib on Coumadin; and chronic diastolic heart failure who presented with syncope leading to a fall.  The patient reports having had about 3 weeks of diarrhea with fecal incontinence (she already had urinary incontinence); she went to her PCP and was diagnosed empirically with diverticulitis (no imaging performed) and given Cipro/Flagyl.  The diarrhea was mostly in the evenings and would consist of 4-5 episodes nightly  Around the same time, she began having pre-syncopal episodes where her vision would go dark but she was able to catch herself as she was falling.  Her diarrhea did improve some after starting the medications but over the last few days it has returned.  Overnight, she got up to use the bathroom.  This time, she fully lost consciousness and fell to the ground.  She has chronic pain and acute pain from the fall, but she does not c/o back pain.  Today tears from hip pain along with left knee pain and was also found to be in A. fib with RVR.  Assessment & Plan:   Principal Problem:   Syncope Active Problems:   Chronic diastolic heart failure (HCC)   Chronic atrial fibrillation ([place]): CHA2DS2-VASc Score 5; on Warfarin   Morbid obesity (HCC)   OSA (obstructive sleep apnea)   Venous stasis of both lower extremities   Essential hypertension   Fibromyalgia   Depression   Anxiety   Anticoagulant long-term use   Anatomical narrow angle borderline glaucoma of both eyes   Restless legs syndrome   History of seizures   Hypokalemia due to loss of potassium  Syncope -Patient presented with recurrent presyncopal spells, but most recent episode appears to be more c/w true syncope -The most likely etiology currently appears to be from her recurrent diarrhea and  critical hypokalemia -Will monitor on telemetry in SDU for now -Orthostatic vital signs on admission and will re-do this AM -She did not have any imaging of her brain; she reports stool and urinary incontinence as well as ataxia and some memory loss and so NPH is a consideration (she is extremely poorly conditioned and so there are other potential causes) - -Ordered non-contrast head CT and showed Chronic atrophic changes without acute abnormality. -2d echo performed and unremarkable with poor overall visualization; lower suspicion for structural heart disease as the etiology although conduction issues are a clear consideration given her markedly low K+ -C/w Neuro checks  -Checked A1c and ws 7.9, FLP as below, TSH normal at 0.777 -Continue ASA -PT/OT eval and treat and recommending SNF -C/w IVF hydration with NS but reduce rate to 75 mL/hr and now will further reduce rate to 50 mL/hr x 12 more hours  Hypokalemia -Patient with marked hypokalemia but is improved and is now 4.3 -This is likely related to recurrent diarrhea and ongoing GI loss in hte setting of Lasix and Zaroxolyn use -Replete with po KCl 40 mEQ BID again yesterday  -Will follow closely.  If she is having ongoing GI losses, more K+ repletion may be needed. -Mag given and will check Mag level.  -EKG with prolonged QT (611) on admission, so will need to avoid medications which can worsen this; repeat EKG q 6h for now to ensure this is improving with K+ repletion. -Repeat EKG now showing  QTc of 442 -Repeat CMP in AM   Diarrhea, improving  -Uncertain etiology. -She had an empiric diagnosis of diverticulitis by her PCP recently (according to the patient) and was given Cipro/Flagyl with only transient improvement.  -Patient is being treated since August 26, 2017 with Cipro and Flagyl.  Has had 1 bowel movement since admission  -Afebrile, white count was 19.0 on admission and trending dow and is now 8.5 (Likely some hemoconcentration  as well) -Will continue home Cipro and Flagyl to complete her therapy  -Will order a CT of her abdomen with only PO contrast given her renal insufficiency. -CT Abd/Pelvis Normal -Consider stool studies including C diff based on CT results and if she is having ongoing issues but will hold off for now  -This is likely to source of her hypokalemia and so finding the source of the diarrhea is important to preventing ongoing GI losses.  CAD -Last catheterization in September 2017, EKG atrial fibrillation, with LVH, with secondary repolarization abnormality, old inferior infarct, prolonged QT   -Tn less than 0.03  patient is cardiac pain free at this time. -Continue ASA, high dose statin, and resume Metoprolol    Diastolic CHF:  -No acute decompensation, weight 276 lbs  Last 2D echo December 2017 EF 55 to 23%, normal systolic -Continue to Hold Lasix and Zaroxolyn due to above and likely resume in AM  -Resume Home BB -Obtain daily weights and Monitor intake and output; Patient is +1,869 mL and weight is up 1 lb  -BNP was 137.8 -Continued with IV fluid hydration with normal saline to reduce rate to 75 mL's per hour and now further reduce to 50 mL/hr for 12 more hours  -Continue to monitor volume status very carefully  Atrial Fibrillation with RVR -Found to have a rapid ventricular rate this morning and likely secondary to pain -CHA2DS2-VASc score 5 on anticoagulation with Coumadin  -PT 29.2 and INR was 2.79 this AM -Resumed Metoprolol Succinate 50 mg po Daily  -Will give 1x of IV Metoprolol 5 mg and if still maintaining elevated HR will start Cardizem -Continue Coumadin per pharmacy dosing  -If still unable to achieve HR Control will consult Cardiology for further assistance.   Hypertension  -BP was on the softer Side earlier at 99/47 but is now stable at 121/90 -Continue to Hold Furosemide 80 mg po BID, Metolazone 10 mg po PRN; -Resumed Metoprolol Succinate 50 mg po  Daily  Hyperlipidemia -Lipid Panel showed cholesterol level 103, HDL of 34, LDL 54, Triglycerides of 73, VLDL of 15 -Continue Ezetimibe  Leukocytosis, improved -Likely reactive, UA is Negative, patient had a history of recent diarrhea treated with Cipro and Flagyl, volume loss.  -Non toxic appearing . WBC 19 and trending down and improved to 8.5 -Patient is  Afebrile  -Will refrain from further antibiotic treatment for now, until patient is better hydrated, and if repeat CBC does not show improvement despite IV fluids -Continue to Monitor for S/Sx of Infection -Repeat CBC in AM   Type II Diabetes with neuropathy  -HbA1c is 7.9 -C/w Insulin Detemir 66 units sq qHS and Moderate Novolog SSI AC -C/w Gabapentin 600 mg po TID  -CBG's ranging from 186-220  Anxiety and Major Depression/Fibromyalgia/ Chronic pain syndrome/ Insomnia  -Continue Alprazolam 0.25 mg TID daily   -For now we will continue BuSpirone 10 mg po daily, Venalafaxine XR 150 mg po Daily, and Sertraline 100 mg po Daily  -Continue Gabapentin 600 mg po TID -Continue Lidocaine 5% 1 Patch  and home Hydrocodone-Acetaminophen 1-2 tab po q4hprn -Try to minimize narcotics a  Restless Leg Syndrome -Continue Pramipexole 0.125 mg po Daily   OSA -Continue CPAP nightly  Right Hip and Left Knee Pain -Obtained Right Hip X-Ray: No convincing evidence of hip fracture. If concern persists, CT imaging would be more sensitive. -Left Knee X-Ray showed No acute fracture or dislocation identified. Moderate tricompartmental osteoarthrosis greatest in the medial femorotibial compartment. -C/w Hydrocodone-Acetaminophen 1-2 tab po q4hprn Moderate pain and increased IV Morphine to 1 mg q3hprn -Also continue Lidocaine 5% patch and started Diclofenac Sodium 1% TD Gel 2 grams topically 4 times a day  -Add K Pad  -C/w PT/OT -If pain is not improving will pursue Hip CT  Hypophosphatemia -Patient's phosphorus level this morning is  1.9 -Replete with K-Phos Neutral 500 mg p.o. twice daily x2 doses -Continue to monitor and replete as necessary -Repeat phosphorus level in the a.m.  Normocytic Anemia -Patient's hemoglobin/hematocrit went from 11.8/38.9 is now 10.5/34.9 -Continue with ferrous sulfate 325 mg p.o. -Check Anemia Panel in a.m. -Continue to monitor for signs and symptoms of bleeding -Repeat CBC in AM.  DVT prophylaxis: Anticoagulated with Coumadin  Code Status: FULL CODE Family Communication: No family present at bedside  Disposition Plan: SNF when medically stable for D/C likely in 24-48 hours  Consultants:   None  Procedures:   ECHOCARDIOGRAM ------------------------------------------------------------------- Study Conclusions  - Left ventricle: The cavity size was normal. Wall thickness was   increased in a pattern of mild LVH. Systolic function was normal.   The estimated ejection fraction was in the range of 55% to 60%.   Images were inadequate for LV wall motion assessment.   Indeterminate diastolic function. - Aortic valve: Mildly to moderately calcified annulus. Trileaflet;   mildly calcified leaflets. There was very mild stenosis. Mean   gradient (S): 9 mm Hg. Peak gradient (S): 16 mm Hg. VTI ratio of   LVOT to aortic valve: 0.48. Valve area (VTI): 1.83 cm^2. Valve   area (Vmax): 1.76 cm^2. Valve area (Vmean): 2 cm^2. - Mitral valve: Moderately calcified annulus. There was trivial   regurgitation. - Left atrium: The atrium was moderately dilated. - Right atrium: Central venous pressure (est): 3 mm Hg. - Atrial septum: No defect or patent foramen ovale was identified. - Tricuspid valve: There was trivial regurgitation. - Pulmonary arteries: Systolic pressure could not be accurately   estimated. - Pericardium, extracardiac: There was no pericardial effusion.   Antimicrobials:  Anti-infectives (From admission, onward)   Start     Dose/Rate Route Frequency Ordered Stop   09/03/17  0800  ciprofloxacin (CIPRO) tablet 500 mg  Status:  Discontinued     500 mg Oral 2 times daily 09/03/17 0722 09/03/17 1011   09/03/17 0745  metroNIDAZOLE (FLAGYL) tablet 500 mg  Status:  Discontinued     500 mg Oral Every 6 hours 09/03/17 0722 09/03/17 1011     Subjective: Seen and Examined this morning she is very upset and tearful because she was in significant pain specifically in her right hip and her left knee.  States that when she lays flat the hip pain gets worse.  States that she is no longer having bowel movements.  No chest pain, shortness breath.  Felt very uncomfortable.  No other concerns or complaints at this time.  Objective: Vitals:   09/05/17 0750 09/05/17 1200 09/05/17 1202 09/05/17 1204  BP: 99/61 (!) 99/47 105/89 121/90  Pulse: 72     Resp: 19  Temp: 98.5 F (36.9 C)     TempSrc: Oral     SpO2: 91%     Weight:      Height:        Intake/Output Summary (Last 24 hours) at 09/05/2017 1244 Last data filed at 09/05/2017 6546 Gross per 24 hour  Intake 2309.89 ml  Output 1350 ml  Net 959.89 ml   Filed Weights   09/03/17 2010 09/05/17 0500  Weight: 128.2 kg (282 lb 10.1 oz) 128.5 kg (283 lb 4.7 oz)   Examination: Physical Exam:  Constitutional: Well-nourished, well-developed obese Caucasian female who is currently very tearful and in distress secondary to pain and appears uncomfortable Eyes: Lids and conjunctive are normal.  Sclera anicteric ENMT: External ears and nose appear normal.  Grossly normal hearing Neck: Appears supple with no JVD Respiratory: Diminished to auscultation laterally with no appreciable wheezing, rales, rhonchi.  Patient not tachypneic using accessory muscle breathe Cardiovascular: Irregularly irregular and tachycardic.  No appreciable murmurs, rubs, gallops.  S1-S2 auscultated. Somelower extremity edema Abdomen: Soft,  slightly tender on the right lower quadrant, distended severely secondary body habitus and has a large pannus.  Bowel  sounds present all 4 quadrants GU: Deferred Musculoskeletal:  no contractures or cyanosis.  No joint deformities noted Skin: Does have some bruising on her arms.  No rashes or lesions on limited skin evaluation Neurologic: Cranial nerves II through XII grossly intact with no appreciable focal deficits.  Romberg sign and cerebellar reflexes not assessed Psychiatric: Tearful mood and affect.  Normal judgment and insight.  Patient is awake and alert and oriented x3  Data Reviewed: I have personally reviewed following labs and imaging studies  CBC: Recent Labs  Lab 09/03/17 0327 09/03/17 1356 09/04/17 0814 09/05/17 0341  WBC 19.0* 14.7* 10.4 8.5  NEUTROABS  --   --  7.6 5.9  HGB 14.3 12.0 11.8* 10.5*  HCT 45.9 38.3 38.9 34.9*  MCV 81.4 81.5 82.2 83.5  PLT 250 212 179 503   Basic Metabolic Panel: Recent Labs  Lab 09/03/17 0930 09/03/17 1356 09/03/17 2041 09/04/17 0236 09/04/17 0814 09/05/17 0341  NA 134* 134* 132* 134*  --  137  K 2.1* 2.8* 2.6* 3.4*  --  4.3  CL 90* 90* 92* 95*  --  102  CO2 29 32 29 33*  --  29  GLUCOSE 312* 242* 332* 235*  --  169*  BUN 32* 30* 25* 20  --  10  CREATININE 1.32* 1.28* 1.14* 0.99  --  0.74  CALCIUM 8.4* 8.6* 8.4* 8.5*  --  8.4*  MG  --  2.5*  --   --  2.3 2.1  PHOS  --   --   --   --  2.0* 1.9*   GFR: Estimated Creatinine Clearance: 102.5 mL/min (by C-G formula based on SCr of 0.74 mg/dL). Liver Function Tests: Recent Labs  Lab 09/05/17 0341  AST 16  ALT 11  ALKPHOS 61  BILITOT 0.9  PROT 5.6*  ALBUMIN 2.7*   No results for input(s): LIPASE, AMYLASE in the last 168 hours. No results for input(s): AMMONIA in the last 168 hours. Coagulation Profile: Recent Labs  Lab 09/03/17 0330 09/04/17 0236 09/05/17 0341  INR 2.24 2.57 2.79   Cardiac Enzymes: Recent Labs  Lab 09/03/17 0527  TROPONINI <0.03   BNP (last 3 results) No results for input(s): PROBNP in the last 8760 hours. HbA1C: Recent Labs    09/03/17 0327  HGBA1C  7.9*   CBG:  Recent Labs  Lab 09/04/17 0633 09/04/17 1131 09/04/17 1630 09/04/17 2123 09/05/17 1134  GLUCAP 190* 199* 191* 220* 186*   Lipid Profile: Recent Labs    09/04/17 0236  CHOL 103  HDL 34*  LDLCALC 54  TRIG 73  CHOLHDL 3.0   Thyroid Function Tests: Recent Labs    09/03/17 0930  TSH 0.777   Anemia Panel: No results for input(s): VITAMINB12, FOLATE, FERRITIN, TIBC, IRON, RETICCTPCT in the last 72 hours. Sepsis Labs: No results for input(s): PROCALCITON, LATICACIDVEN in the last 168 hours.  No results found for this or any previous visit (from the past 240 hour(s)).   Radiology Studies: Ct Abdomen Pelvis Wo Contrast  Result Date: 09/03/2017 CLINICAL DATA:  Diarrhea and recent syncopal episode EXAM: CT ABDOMEN AND PELVIS WITHOUT CONTRAST TECHNIQUE: Multidetector CT imaging of the abdomen and pelvis was performed following the standard protocol without IV contrast. COMPARISON:  03/19/2015 FINDINGS: Lower chest: Lung bases are free of acute infiltrate or sizable effusion. Hepatobiliary: No focal liver abnormality is seen. Status post cholecystectomy. No biliary dilatation. Pancreas: Unremarkable. No pancreatic ductal dilatation or surrounding inflammatory changes. Spleen: Normal in size without focal abnormality. Adrenals/Urinary Tract: Adrenal glands are within normal limits. Renal cystic change is noted. No renal calculi or obstructive changes are seen. The bladder is partially distended. Stomach/Bowel: Minimal diverticular change of the colon is noted. The appendix is not well visualized and may have been surgically removed. No inflammatory changes to suggest appendicitis are noted. Small bowel is within normal limits. Vascular/Lymphatic: Aortic atherosclerosis. No enlarged abdominal or pelvic lymph nodes. Reproductive: Status post hysterectomy. No adnexal masses. Other: No abdominal wall hernia or abnormality. No abdominopelvic ascites. Musculoskeletal: Right hip  replacement is noted. Degenerative changes of the lumbar spine are seen. IMPRESSION: No acute abnormality identified. Electronically Signed   By: Inez Catalina M.D.   On: 09/03/2017 12:59   Ct Head Wo Contrast  Result Date: 09/03/2017 CLINICAL DATA:  Recent syncopal episode EXAM: CT HEAD WITHOUT CONTRAST TECHNIQUE: Contiguous axial images were obtained from the base of the skull through the vertex without intravenous contrast. COMPARISON:  01/17/2015 FINDINGS: Brain: No evidence of acute infarction, hemorrhage, hydrocephalus, extra-axial collection or mass lesion/mass effect. Mild atrophic changes are noted. Vascular: No hyperdense vessel or unexpected calcification. Skull: Normal. Negative for fracture or focal lesion. Sinuses/Orbits: No acute finding. Other: None. IMPRESSION: Chronic atrophic changes without acute abnormality. Electronically Signed   By: Inez Catalina M.D.   On: 09/03/2017 12:56   Dg Hip Unilat With Pelvis 2-3 Views Right  Result Date: 09/05/2017 CLINICAL DATA:  Right pain after fall. EXAM: DG HIP (WITH OR WITHOUT PELVIS) 2-3V RIGHT COMPARISON:  CT of the abdomen and pelvis and pelvis x-ray September 03, 2017. CT the abdomen and pelvis March 19, 2015. FINDINGS: Patient is status post right hip replacement. Femoral and acetabular components are in good position. Dystrophic calcifications in the soft tissues around the right hip are stable since 2017. No definitive acute fracture identified. Irregularity of the greater trochanter is stable. Mild lucency at the base of the greater trochanter on the third image extends outside the bone and is thought to be in the soft tissues. Visualized pelvic bones are normal. IMPRESSION: 1. No convincing evidence of hip fracture. If concern persists, CT imaging would be more sensitive. Electronically Signed   By: Dorise Bullion III M.D   On: 09/05/2017 11:11   Scheduled Meds: . ALPRAZolam  0.25 mg Oral TID  . aspirin  81  mg Oral Daily  . busPIRone  10 mg Oral  Daily  . calcium-vitamin D  1 tablet Oral Q breakfast  . cholecalciferol  4,000 Units Oral Daily  . diclofenac sodium  2 g Topical QID  . ezetimibe  10 mg Oral Daily  . ferrous sulfate  325 mg Oral See admin instructions  . gabapentin  600 mg Oral TID  . insulin aspart  0-15 Units Subcutaneous TID WC  . insulin detemir  66 Units Subcutaneous QHS  . magnesium oxide  400 mg Oral TID  . metoprolol succinate  50 mg Oral Daily  . phosphorus  500 mg Oral BID  . pramipexole  0.125 mg Oral Daily  . sertraline  100 mg Oral Daily  . sodium chloride flush  3 mL Intravenous Q12H  . venlafaxine XR  150 mg Oral Q breakfast  . warfarin  5 mg Oral ONCE-1800  . Warfarin - Pharmacist Dosing Inpatient   Does not apply q1800   Continuous Infusions: . 0.9 % NaCl with KCl 40 mEq / L 75 mL/hr (09/05/17 1011)    LOS: 2 days   Kerney Elbe, DO [facility] Pager [phone]  If 7PM-7AM, please contact night-coverage [email or web address] Password [email or web address] 09/05/2017, 12:44 PM

## 2017-09-06 ENCOUNTER — Other Ambulatory Visit: Payer: Self-pay

## 2017-09-06 ENCOUNTER — Inpatient Hospital Stay (HOSPITAL_COMMUNITY): Payer: Medicare Other

## 2017-09-06 DIAGNOSIS — I878 Other specified disorders of veins: Secondary | ICD-10-CM

## 2017-09-06 LAB — COMPREHENSIVE METABOLIC PANEL
ALBUMIN: 2.5 g/dL — AB (ref 3.5–5.0)
ALK PHOS: 62 U/L (ref 38–126)
ALT: 12 U/L (ref 0–44)
ANION GAP: 6 (ref 5–15)
AST: 15 U/L (ref 15–41)
BUN: 13 mg/dL (ref 8–23)
CALCIUM: 8.4 mg/dL — AB (ref 8.9–10.3)
CO2: 29 mmol/L (ref 22–32)
Chloride: 103 mmol/L (ref 98–111)
Creatinine, Ser: 0.8 mg/dL (ref 0.44–1.00)
GFR calc Af Amer: >60 mL/min (ref >60)
GFR calc non Af Amer: >60 mL/min (ref >60)
GLUCOSE: 188 mg/dL — AB (ref 70–99)
POTASSIUM: 4.5 mmol/L (ref 3.5–5.1)
SODIUM: 138 mmol/L (ref 135–145)
Total Bilirubin: 0.9 mg/dL (ref 0.3–1.2)
Total Protein: 5.6 g/dL — ABNORMAL LOW (ref 6.5–8.1)

## 2017-09-06 LAB — MAGNESIUM: Magnesium: 2.1 mg/dL (ref 1.7–2.4)

## 2017-09-06 LAB — PROTIME-INR
INR: 2.61
Prothrombin Time: 27.8 seconds — ABNORMAL HIGH (ref 11.4–15.2)

## 2017-09-06 LAB — CBC WITH DIFFERENTIAL/PLATELET
Abs Immature Granulocytes: 0.1 10*3/uL (ref 0.0–0.1)
BASOS ABS: 0 10*3/uL (ref 0.0–0.1)
Basophils Relative: 0 %
Eosinophils Absolute: 0.2 10*3/uL (ref 0.0–0.7)
Eosinophils Relative: 4 %
HCT: 33 % — ABNORMAL LOW (ref 36.0–46.0)
HEMOGLOBIN: 9.7 g/dL — AB (ref 12.0–15.0)
IMMATURE GRANULOCYTES: 1 %
LYMPHS ABS: 1.4 10*3/uL (ref 0.7–4.0)
LYMPHS PCT: 20 %
MCH: 25.3 pg — ABNORMAL LOW (ref 26.0–34.0)
MCHC: 29.4 g/dL — AB (ref 30.0–36.0)
MCV: 86.2 fL (ref 78.0–100.0)
Monocytes Absolute: 0.6 10*3/uL (ref 0.1–1.0)
Monocytes Relative: 9 %
NEUTROS ABS: 4.4 10*3/uL (ref 1.7–7.7)
NEUTROS PCT: 66 %
Platelets: 144 10*3/uL — ABNORMAL LOW (ref 150–400)
RBC: 3.83 MIL/uL — AB (ref 3.87–5.11)
RDW: 17.2 % — ABNORMAL HIGH (ref 11.5–15.5)
WBC: 6.7 10*3/uL (ref 4.0–10.5)

## 2017-09-06 LAB — GLUCOSE, CAPILLARY
Glucose-Capillary: 175 mg/dL — ABNORMAL HIGH (ref 70–99)
Glucose-Capillary: 171 mg/dL — ABNORMAL HIGH (ref 70–99)
Glucose-Capillary: 230 mg/dL — ABNORMAL HIGH (ref 70–99)

## 2017-09-06 LAB — IRON AND TIBC
Iron: 24 ug/dL — ABNORMAL LOW (ref 28–170)
Saturation Ratios: 11 % (ref 10.4–31.8)
TIBC: 214 ug/dL — ABNORMAL LOW (ref 250–450)
UIBC: 190 ug/dL

## 2017-09-06 LAB — RETICULOCYTES
RBC.: 3.83 MIL/uL — ABNORMAL LOW (ref 3.87–5.11)
RETIC COUNT ABSOLUTE: 84.3 10*3/uL (ref 19.0–186.0)
Retic Ct Pct: 2.2 % (ref 0.4–3.1)

## 2017-09-06 LAB — PHOSPHORUS: Phosphorus: 3.8 mg/dL (ref 2.5–4.6)

## 2017-09-06 LAB — FERRITIN: FERRITIN: 316 ng/mL — AB (ref 11–307)

## 2017-09-06 LAB — FOLATE: FOLATE: 7 ng/mL (ref >5.9)

## 2017-09-06 LAB — VITAMIN B12: VITAMIN B 12: 445 pg/mL (ref 180–914)

## 2017-09-06 MED ORDER — WARFARIN SODIUM 5 MG PO TABS
5.0000 mg | ORAL_TABLET | Freq: Once | ORAL | Status: AC
  Administered 2017-09-06: 5 mg via ORAL
  Filled 2017-09-06: qty 1

## 2017-09-06 NOTE — Progress Notes (Signed)
Patient would not walk for entire shift today other than transfers to [facility] and [facility] stating that her R hip hurt too much. She would cry out in pain when asked about pain or requested to walk.

## 2017-09-06 NOTE — Progress Notes (Signed)
PROGRESS NOTE    Donna [name]  HER:[id number] DOB: 04/23/1950 DOA: 09/03/2017 PCP: Guadlupe Spanish, MD   Brief Narrative:  Donna [name] is a 67 y.o. female with a Past Medical History of RLS; depression/anxiety; fibromyalgia; DM; afib on Coumadin; and chronic diastolic heart failure who presented with syncope leading to a fall.  The patient reports having had about 3 weeks of diarrhea with fecal incontinence (she already had urinary incontinence); she went to her PCP and was diagnosed empirically with diverticulitis (no imaging performed) and given Cipro/Flagyl.  The diarrhea was mostly in the evenings and would consist of 4-5 episodes nightly  Around the same time, she began having pre-syncopal episodes where her vision would go dark but she was able to catch herself as she was falling.  Her diarrhea did improve some after starting the medications but over the last few days it has returned.  Overnight, she got up to use the bathroom.  This time, she fully lost consciousness and fell to the ground.  She has chronic pain and acute pain from the fall, but she does not c/o back pain.  Yesterday she had tears from hip pain along with left knee pain and was also found to be in A. fib with RVR. She was given one dose of Metoprolol IV and HR improved.  Today she was seen at bedside and prior to walking and she is wearing headphones watching TV.  As soon as I walked in and went to examine her she became very upset and tearful stating she was in excruciating pain and will lean the bed back to show that she was in pain.  States that her right hip was hurting tremendously and that she had 2 episodes of diarrhea overnight.  Assessment & Plan:   Principal Problem:   Syncope Active Problems:   Chronic diastolic heart failure (HCC)   Chronic atrial fibrillation ([place]): CHA2DS2-VASc Score 5; on Warfarin   Morbid obesity (HCC)   OSA (obstructive sleep apnea)   Venous stasis of both lower extremities   Essential  hypertension   Fibromyalgia   Depression   Anxiety   Anticoagulant long-term use   Anatomical narrow angle borderline glaucoma of both eyes   Restless legs syndrome   History of seizures   Hypokalemia due to loss of potassium  Syncope -Patient presented with recurrent presyncopal spells, but most recent episode appears to be more c/w true syncope -The most likely etiology currently appears to be from her recurrent diarrhea and critical hypokalemia -Will monitor on telemetry in SDU for now -Orthostatic vital signs on admission and done this AM and she was not Orthostatic as BP actually improved  -She did not have any imaging of her brain; she reports stool and urinary incontinence as well as ataxia and some memory loss and so NPH is a consideration (she is extremely poorly conditioned and so there are other potential causes) - -Ordered non-contrast head CT and showed Chronic atrophic changes without acute abnormality. -2d echo performed and unremarkable with poor overall visualization; lower suspicion for structural heart disease as the etiology although conduction issues are a clear consideration given her markedly low K+ -C/w Neuro checks  -Checked A1c and ws 7.9, FLP as below, TSH normal at 0.777 -Continue ASA -PT/OT eval and treat and recommending SNF -IVF hydration with NS but reduce rate to 75 mL/hr and now will further reduce rate to 50 mL/hr x 12 more hours and now has stopped   Hypokalemia -Patient  with marked hypokalemia but is improved and is now 4.5 -This is likely related to recurrent diarrhea and ongoing GI loss in hte setting of Lasix and Zaroxolyn use -Will follow closely.  If she is having ongoing GI losses, more K+ repletion may be needed. -Mag given and will check Mag level.  -EKG with prolonged QT (611) on admission, so will need to avoid medications which can worsen this; repeat EKG q 6h for now to ensure this is improving with K+ repletion. -Repeat EKG now showing  QTc of 456 -Repeat CMP in AM   Diarrhea, improving  -Uncertain etiology. -She had an empiric diagnosis of diverticulitis by her PCP recently (according to the patient) and was given Cipro/Flagyl with only transient improvement.  -Patient is being treated since August 26, 2017 with Cipro and Flagyl.  Had had 1 bowel movement since admission until last night when she had 2   -Afebrile, white count was 19.0 on admission and trending dow and is now 6.7(Likely some hemoconcentration as well) -Continued home Cipro and Flagyl to complete her therapy and now finished course  -Will order a CT of her abdomen with only PO contrast given her renal insufficiency. -CT Abd/Pelvis Normal -Consider stool studies including C diff based on CT results and if she is having ongoing issues but will hold off for now  -This is likely to source of her hypokalemia and so finding the source of the diarrhea is important to preventing ongoing GI losses.  CAD -Last catheterization in September 2017, EKG atrial fibrillation, with LVH, with secondary repolarization abnormality, old inferior infarct, prolonged QT   -Tn less than 0.03  patient is cardiac pain free at this time. -Continue ASA, high dose statin, and resume Metoprolol    Diastolic CHF:  -No acute decompensation, weight 276 lbs  Last 2D echo December 2017 EF 55 to 56%, normal systolic -Continue to Hold Lasix and Zaroxolyn due to above and likely resume in a few days  -Resume Home BB -Obtain daily weights and Monitor intake and output; Patient is +1,869 mL and weight is up 1 lb  -BNP was 137.8 -Continued with IV fluid hydration with normal saline to reduce rate to 75 mL's per hour and now further reduce to 50 mL/hr for 12 more hours and now stopped   -Continue to monitor volume status very carefully  Atrial Fibrillation with RVR -Found to have a rapid ventricular rate this morning and likely secondary to pain -CHA2DS2-VASc score 5 on anticoagulation with  Coumadin  -PT 29.2 and INR was 2.79 this AM -Resumed Metoprolol Succinate 50 mg po Daily  -Given 1x of IV Metoprolol 5 mg yesterday and improved and if still maintaining elevated HR will start Cardizem -Continue Coumadin per pharmacy dosing  -If still unable to achieve HR Control will consult Cardiology for further assistance.   Hypertension  -BP was on the softer Side earlier at 99/47 but is now stable at 121/90 -Continue to Hold Furosemide 80 mg po BID, Metolazone 10 mg po PRN; -Resumed Metoprolol Succinate 50 mg po Daily  Hyperlipidemia -Lipid Panel showed cholesterol level 103, HDL of 34, LDL 54, Triglycerides of 73, VLDL of 15 -Continue Ezetimibe  Leukocytosis, improved -Likely reactive, UA is Negative, patient had a history of recent diarrhea treated with Cipro and Flagyl, volume loss.  -Non toxic appearing . WBC 19 and trending down and improved to 6.7 -Patient is  Afebrile  -Will refrain from further antibiotic treatment for now, until patient is better hydrated, and  if repeat CBC does not show improvement despite IV fluids -Continue to Monitor for S/Sx of Infection -Repeat CBC in AM   Type II Diabetes with neuropathy  -HbA1c is 7.9 -C/w Insulin Detemir 66 units sq qHS and Moderate Novolog SSI AC -C/w Gabapentin 600 mg po TID  -CBG's ranging from 171-192  Anxiety and Major Depression/Fibromyalgia/ Chronic pain syndrome/ Insomnia  -Continue Alprazolam 0.25 mg TID daily   -For now we will continue BuSpirone 10 mg po daily, Venalafaxine XR 150 mg po Daily, and Sertraline 100 mg po Daily  -Continue Gabapentin 600 mg po TID -Continue Lidocaine 5% 1 Patch and home Hydrocodone-Acetaminophen 1-2 tab po q4hprn -Try to minimize narcotics   Restless Leg Syndrome -Continue Pramipexole 0.125 mg po Daily   OSA -Continue CPAP nightly  Right Hip and Left Knee Pain -Obtained Right Hip X-Ray: No convincing evidence of hip fracture. If concern persists, CT imaging would be  more sensitive. -Left Knee X-Ray showed No acute fracture or dislocation identified. Moderate tricompartmental osteoarthrosis greatest in the medial femorotibial compartment. -C/w Hydrocodone-Acetaminophen 1-2 tab po q4hprn Moderate pain and increased IV Morphine to 1 mg q3hprn -Also continue Lidocaine 5% patch and started Diclofenac Sodium 1% TD Gel 2 grams topically 4 times a day  -? How real her pain is given that she was watching a show on her tablet before I came in and was in No Acute Distress but as soon as I started talking to her she started screaming in pain.  -Add K Pad  -C/w PT/OT -Will obtain Hip CT today as she states her Hip Pain is her biggest complaint; Hip CT Done and showed No acute abnormality. Status post right hip replacement. Heterotopic ossification about the device is chronic.  Hypophosphatemia -Patient's phosphorus level was 1.9 and now is 3.8 -Replete with K-Phos Neutral 500 mg p.o. twice daily x2 doses yesterday -Continue to monitor and replete as necessary -Repeat phosphorus level in the a.m.  Normocytic Anemia -Patient's hemoglobin/hematocrit went from 11.8/38.9 is now 9.7/33.0 -Continue with ferrous sulfate 325 mg p.o. -Checked Anemia Panel and showed 24, U IBC 190, TIBC of 214, saturation ratios of 11%, ferritin level 316, folate level 7.0, vitamin B12 level of 445 -Continue to monitor for signs and symptoms of bleeding -Repeat CBC in AM.  DVT prophylaxis: Anticoagulated with Coumadin  Code Status: FULL CODE Family Communication: Discussed with Husband over the phone Disposition Plan: SNF when medically stable for D/C likely in 24-48 hours  Consultants:   None  Procedures:   ECHOCARDIOGRAM ------------------------------------------------------------------- Study Conclusions  - Left ventricle: The cavity size was normal. Wall thickness was   increased in a pattern of mild LVH. Systolic function was normal.   The estimated ejection fraction  was in the range of 55% to 60%.   Images were inadequate for LV wall motion assessment.   Indeterminate diastolic function. - Aortic valve: Mildly to moderately calcified annulus. Trileaflet;   mildly calcified leaflets. There was very mild stenosis. Mean   gradient (S): 9 mm Hg. Peak gradient (S): 16 mm Hg. VTI ratio of   LVOT to aortic valve: 0.48. Valve area (VTI): 1.83 cm^2. Valve   area (Vmax): 1.76 cm^2. Valve area (Vmean): 2 cm^2. - Mitral valve: Moderately calcified annulus. There was trivial   regurgitation. - Left atrium: The atrium was moderately dilated. - Right atrium: Central venous pressure (est): 3 mm Hg. - Atrial septum: No defect or patent foramen ovale was identified. - Tricuspid valve: There was trivial regurgitation. -  Pulmonary arteries: Systolic pressure could not be accurately   estimated. - Pericardium, extracardiac: There was no pericardial effusion.   Antimicrobials:  Anti-infectives (From admission, onward)   Start     Dose/Rate Route Frequency Ordered Stop   09/03/17 0800  ciprofloxacin (CIPRO) tablet 500 mg  Status:  Discontinued     500 mg Oral 2 times daily 09/03/17 0722 09/03/17 1011   09/03/17 0745  metroNIDAZOLE (FLAGYL) tablet 500 mg  Status:  Discontinued     500 mg Oral Every 6 hours 09/03/17 0722 09/03/17 1011     Subjective: Seen and Examined this morning and had her earphones and watching a show on her tablet however when I walked in the room she took this off and started crying uncontrollably stating that she was in so much pain specifically in her right hip.  She would lean her self back in her bed and states that that would make her hip pain worse.  No chest pain, lightheadedness or dizziness.  Was also upset that she is having diarrhea overnight and had 2 episodes.  She is very upset and agitated this morning  Objective: Vitals:   09/06/17 0823 09/06/17 1417 09/06/17 1419 09/06/17 1630  BP: 119/86   109/61  Pulse:      Resp:  20      Temp: 97.9 F (36.6 C)  98.1 F (36.7 C) 98.2 F (36.8 C)  TempSrc: Oral  Oral Oral  SpO2: 96%   93%  Weight:      Height:        Intake/Output Summary (Last 24 hours) at 09/06/2017 1831 Last data filed at 09/06/2017 1300 Gross per 24 hour  Intake 1136.03 ml  Output 1500 ml  Net -363.97 ml   Filed Weights   09/03/17 2010 09/05/17 0500  Weight: 128.2 kg (282 lb 10.1 oz) 128.5 kg (283 lb 4.7 oz)   Examination: Physical Exam:  Constitutional: Well nourished, well-developed obese Caucasian female is currently in some distress very tearful and appears depressed and histrionic  Eyes: Lids and conjunctive are normal.  Sclera anicteric ENMT: External ears and nose appear normal.  Grossly normal hearing Neck: Appears supple no JVD Respiratory: Diminished to auscultation bilaterally with no appreciable wheezing, rales, rhonchi.  No accessory muscle usage and was wearing supplemental oxygen but took it off and her distress Cardiovascular: Irregularly irregular tachycardic.  No appreciable murmurs, rubs or gallops.  S1-S2 auscultated.  Has some lower extremity edema Abdomen: Soft, tender in the right lower quadrant, severely distended secondary body habitus and has a very large pannus.  Bowel sounds present all 4 quadrants GU: Deferred Musculoskeletal: No contractures cyanosis.  No joint deformities noted Skin: Bruising in her upper extremities.  No appreciable rashes or lesions limited skin evaluation  Neurologic: Cranial nerves II through XII grossly intact with no appreciable focal deficits Psychiatric: Tearful mood and affect and appears histrionic and very anxious.  She is awake and alert and oriented x3  Data Reviewed: I have personally reviewed following labs and imaging studies  CBC: Recent Labs  Lab 09/03/17 0327 09/03/17 1356 09/04/17 0814 09/05/17 0341 09/06/17 0502  WBC 19.0* 14.7* 10.4 8.5 6.7  NEUTROABS  --   --  7.6 5.9 4.4  HGB 14.3 12.0 11.8* 10.5* 9.7*  HCT 45.9  38.3 38.9 34.9* 33.0*  MCV 81.4 81.5 82.2 83.5 86.2  PLT 250 212 179 157 941*   Basic Metabolic Panel: Recent Labs  Lab 09/03/17 1356 09/03/17 2041 09/04/17 0236 09/04/17 7408  09/05/17 0341 09/06/17 0502  NA 134* 132* 134*  --  137 138  K 2.8* 2.6* 3.4*  --  4.3 4.5  CL 90* 92* 95*  --  102 103  CO2 32 29 33*  --  29 29  GLUCOSE 242* 332* 235*  --  169* 188*  BUN 30* 25* 20  --  10 13  CREATININE 1.28* 1.14* 0.99  --  0.74 0.80  CALCIUM 8.6* 8.4* 8.5*  --  8.4* 8.4*  MG 2.5*  --   --  2.3 2.1 2.1  PHOS  --   --   --  2.0* 1.9* 3.8   GFR: Estimated Creatinine Clearance: 102.5 mL/min (by C-G formula based on SCr of 0.8 mg/dL). Liver Function Tests: Recent Labs  Lab 09/05/17 0341 09/06/17 0502  AST 16 15  ALT 11 12  ALKPHOS 61 62  BILITOT 0.9 0.9  PROT 5.6* 5.6*  ALBUMIN 2.7* 2.5*   No results for input(s): LIPASE, AMYLASE in the last 168 hours. No results for input(s): AMMONIA in the last 168 hours. Coagulation Profile: Recent Labs  Lab 09/03/17 0330 09/04/17 0236 09/05/17 0341 09/06/17 0502  INR 2.24 2.57 2.79 2.61   Cardiac Enzymes: Recent Labs  Lab 09/03/17 0527  TROPONINI <0.03   BNP (last 3 results) No results for input(s): PROBNP in the last 8760 hours. HbA1C: No results for input(s): HGBA1C in the last 72 hours. CBG: Recent Labs  Lab 09/05/17 1134 09/05/17 1629 09/05/17 2151 09/06/17 0634 09/06/17 1626  GLUCAP 186* 211* 192* 175* 171*   Lipid Profile: Recent Labs    09/04/17 0236  CHOL 103  HDL 34*  LDLCALC 54  TRIG 73  CHOLHDL 3.0   Thyroid Function Tests: No results for input(s): TSH, T4TOTAL, FREET4, T3FREE, THYROIDAB in the last 72 hours. Anemia Panel: Recent Labs    09/06/17 0502  VITAMINB12 445  FOLATE 7.0  FERRITIN 316*  TIBC 214*  IRON 24*  RETICCTPCT 2.2   Sepsis Labs: No results for input(s): PROCALCITON, LATICACIDVEN in the last 168 hours.  No results found for this or any previous visit (from the past 240  hour(s)).   Radiology Studies: Ct Hip Right Wo Contrast  Result Date: 09/06/2017 CLINICAL DATA:  Right hip pain since a fall 09/03/2017. Initial encounter. EXAM: CT OF THE RIGHT HIP WITHOUT CONTRAST TECHNIQUE: Multidetector CT imaging of the right hip was performed according to the standard protocol. Multiplanar CT image reconstructions were also generated. COMPARISON:  Plain films right hip 09/05/2017. CT abdomen and pelvis 03/19/2015. FINDINGS: Bones/Joint/Cartilage Right hip arthroplasty is in place. The device is located. No fracture is identified. Heterotopic ossification is seen about the head and neck of the hip and is unchanged since the prior CT. No focal bone lesion is identified. Degenerative change is seen about the right SI joint. Ligaments Suboptimally assessed by CT. Muscles and Tendons Appear intact. Chronic ossification of the iliopsoas tendon is unchanged since the prior CT. Soft tissues Imaged intrapelvic contents demonstrate no acute abnormality. Atherosclerosis is noted. IMPRESSION: No acute abnormality. Status post right hip replacement. Heterotopic ossification about the device is chronic. Electronically Signed   By: Inge Rise M.D.   On: 09/06/2017 17:30   Dg Hip Unilat With Pelvis 2-3 Views Right  Result Date: 09/05/2017 CLINICAL DATA:  Right pain after fall. EXAM: DG HIP (WITH OR WITHOUT PELVIS) 2-3V RIGHT COMPARISON:  CT of the abdomen and pelvis and pelvis x-ray September 03, 2017. CT the abdomen and pelvis  March 19, 2015. FINDINGS: Patient is status post right hip replacement. Femoral and acetabular components are in good position. Dystrophic calcifications in the soft tissues around the right hip are stable since 2017. No definitive acute fracture identified. Irregularity of the greater trochanter is stable. Mild lucency at the base of the greater trochanter on the third image extends outside the bone and is thought to be in the soft tissues. Visualized pelvic bones are normal.  IMPRESSION: 1. No convincing evidence of hip fracture. If concern persists, CT imaging would be more sensitive. Electronically Signed   By: Dorise Bullion III M.D   On: 09/05/2017 11:11   Scheduled Meds: . ALPRAZolam  0.25 mg Oral TID  . aspirin  81 mg Oral Daily  . busPIRone  10 mg Oral Daily  . calcium-vitamin D  1 tablet Oral Q breakfast  . cholecalciferol  4,000 Units Oral Daily  . diclofenac sodium  2 g Topical QID  . ezetimibe  10 mg Oral Daily  . ferrous sulfate  325 mg Oral See admin instructions  . gabapentin  600 mg Oral TID  . insulin aspart  0-15 Units Subcutaneous TID WC  . insulin detemir  66 Units Subcutaneous QHS  . magnesium oxide  400 mg Oral TID  . metoprolol succinate  50 mg Oral Daily  . pramipexole  0.125 mg Oral Daily  . sertraline  100 mg Oral Daily  . sodium chloride flush  3 mL Intravenous Q12H  . venlafaxine XR  150 mg Oral Q breakfast  . Warfarin - Pharmacist Dosing Inpatient   Does not apply q1800   Continuous Infusions:   LOS: 3 days   Kerney Elbe, DO [facility] Pager [phone]  If 7PM-7AM, please contact night-coverage [email or web address] Password [facility] 09/06/2017, 6:31 PM

## 2017-09-06 NOTE — Progress Notes (Signed)
ANTICOAGULATION CONSULT NOTE - Follow Up Consult  Pharmacy Consult for Warfarin Indication: atrial fibrillation  Allergies  Allergen Reactions  . Bee Venom Hives    Takes benadryl  . Fentanyl Other (See Comments)    Hallucinations, syncope    . Metformin Diarrhea  . Metformin And Related Diarrhea  . Metformin Hcl Diarrhea  . Other Other (See Comments)    Raw foods with seeds give her "boils".  Avoids raw strawberries, blueberries. Tolerates cooked fruits, tomato sauce, bread/grains with seeds. [facility] 10/17/13 Berries with seeds  . Alteplase Rash  . Cefepime Rash    Patient Measurements: Height: 5\' 11"  (180.3 cm) Weight: 283 lb 4.7 oz (128.5 kg) IBW/kg (Calculated) : 70.8  Vital Signs: Temp: 97.9 F (36.6 C) (07/07 0823) Temp Source: Oral (07/07 0823) BP: 119/86 (07/07 0823)  Labs: Recent Labs    09/04/17 0236 09/04/17 0814 09/05/17 0341 09/06/17 0502  HGB  --  11.8* 10.5* 9.7*  HCT  --  38.9 34.9* 33.0*  PLT  --  179 157 144*  LABPROT 27.4*  --  29.2* 27.8*  INR 2.57  --  2.79 2.61  CREATININE 0.99  --  0.74 0.80    Estimated Creatinine Clearance: 102.5 mL/min (by C-G formula based on SCr of 0.8 mg/dL).  Assessment: 67 year old female on warfarin prior to admission for atrial fibrillation who presented to the ED on 7/5 with syncopal fall likely due to recurrent diarrhea. Patient denies hitting head or headache. No acute fractures on xray.    INR today is therapeutic at 2.61, slightly decreased from yesterday. CBC is stable but down trending slightly. No bleeding noted.   PTA regimen: 7.5mg  po daily except 10mg  of Tuesday  Goal of Therapy:  INR 2-3 Monitor platelets by anticoagulation protocol: Yes   Plan:  Warfarin 5 mg PO x1 tonight Daily INR Monitor for s/sx of bleeding   Jackson Latino, PharmD PGY1 Pharmacy Resident Phone [phone] 09/06/2017     11:20 AM

## 2017-09-07 DIAGNOSIS — E1165 Type 2 diabetes mellitus with hyperglycemia: Secondary | ICD-10-CM | POA: Diagnosis not present

## 2017-09-07 DIAGNOSIS — E876 Hypokalemia: Secondary | ICD-10-CM | POA: Diagnosis not present

## 2017-09-07 DIAGNOSIS — M797 Fibromyalgia: Secondary | ICD-10-CM | POA: Diagnosis not present

## 2017-09-07 DIAGNOSIS — R079 Chest pain, unspecified: Secondary | ICD-10-CM | POA: Diagnosis not present

## 2017-09-07 DIAGNOSIS — R2689 Other abnormalities of gait and mobility: Secondary | ICD-10-CM | POA: Diagnosis not present

## 2017-09-07 DIAGNOSIS — Z87898 Personal history of other specified conditions: Secondary | ICD-10-CM | POA: Diagnosis not present

## 2017-09-07 DIAGNOSIS — R279 Unspecified lack of coordination: Secondary | ICD-10-CM | POA: Diagnosis not present

## 2017-09-07 DIAGNOSIS — F329 Major depressive disorder, single episode, unspecified: Secondary | ICD-10-CM | POA: Diagnosis not present

## 2017-09-07 DIAGNOSIS — E119 Type 2 diabetes mellitus without complications: Secondary | ICD-10-CM | POA: Diagnosis not present

## 2017-09-07 DIAGNOSIS — E860 Dehydration: Secondary | ICD-10-CM | POA: Diagnosis not present

## 2017-09-07 DIAGNOSIS — R531 Weakness: Secondary | ICD-10-CM | POA: Diagnosis not present

## 2017-09-07 DIAGNOSIS — I10 Essential (primary) hypertension: Secondary | ICD-10-CM | POA: Diagnosis not present

## 2017-09-07 DIAGNOSIS — R41841 Cognitive communication deficit: Secondary | ICD-10-CM | POA: Diagnosis not present

## 2017-09-07 DIAGNOSIS — G8929 Other chronic pain: Secondary | ICD-10-CM | POA: Diagnosis present

## 2017-09-07 DIAGNOSIS — E1140 Type 2 diabetes mellitus with diabetic neuropathy, unspecified: Secondary | ICD-10-CM | POA: Diagnosis not present

## 2017-09-07 DIAGNOSIS — I5032 Chronic diastolic (congestive) heart failure: Secondary | ICD-10-CM | POA: Diagnosis not present

## 2017-09-07 DIAGNOSIS — R600 Localized edema: Secondary | ICD-10-CM | POA: Diagnosis not present

## 2017-09-07 DIAGNOSIS — Z794 Long term (current) use of insulin: Secondary | ICD-10-CM | POA: Diagnosis not present

## 2017-09-07 DIAGNOSIS — Z993 Dependence on wheelchair: Secondary | ICD-10-CM | POA: Diagnosis not present

## 2017-09-07 DIAGNOSIS — I952 Hypotension due to drugs: Secondary | ICD-10-CM | POA: Diagnosis not present

## 2017-09-07 DIAGNOSIS — R55 Syncope and collapse: Secondary | ICD-10-CM | POA: Diagnosis not present

## 2017-09-07 DIAGNOSIS — S8002XA Contusion of left knee, initial encounter: Secondary | ICD-10-CM | POA: Diagnosis not present

## 2017-09-07 DIAGNOSIS — L03319 Cellulitis of trunk, unspecified: Secondary | ICD-10-CM | POA: Diagnosis not present

## 2017-09-07 DIAGNOSIS — R2681 Unsteadiness on feet: Secondary | ICD-10-CM | POA: Diagnosis not present

## 2017-09-07 DIAGNOSIS — R072 Precordial pain: Secondary | ICD-10-CM | POA: Diagnosis not present

## 2017-09-07 DIAGNOSIS — M1612 Unilateral primary osteoarthritis, left hip: Secondary | ICD-10-CM | POA: Diagnosis present

## 2017-09-07 DIAGNOSIS — Z743 Need for continuous supervision: Secondary | ICD-10-CM | POA: Diagnosis not present

## 2017-09-07 DIAGNOSIS — Z7901 Long term (current) use of anticoagulants: Secondary | ICD-10-CM | POA: Diagnosis not present

## 2017-09-07 DIAGNOSIS — Z6841 Body Mass Index (BMI) 40.0 and over, adult: Secondary | ICD-10-CM | POA: Diagnosis not present

## 2017-09-07 DIAGNOSIS — R51 Headache: Secondary | ICD-10-CM | POA: Diagnosis not present

## 2017-09-07 DIAGNOSIS — R42 Dizziness and giddiness: Secondary | ICD-10-CM | POA: Diagnosis not present

## 2017-09-07 DIAGNOSIS — R278 Other lack of coordination: Secondary | ICD-10-CM | POA: Diagnosis not present

## 2017-09-07 DIAGNOSIS — I482 Chronic atrial fibrillation: Secondary | ICD-10-CM | POA: Diagnosis not present

## 2017-09-07 DIAGNOSIS — L089 Local infection of the skin and subcutaneous tissue, unspecified: Secondary | ICD-10-CM | POA: Diagnosis not present

## 2017-09-07 DIAGNOSIS — S76019A Strain of muscle, fascia and tendon of unspecified hip, initial encounter: Secondary | ICD-10-CM | POA: Diagnosis not present

## 2017-09-07 DIAGNOSIS — F418 Other specified anxiety disorders: Secondary | ICD-10-CM | POA: Diagnosis present

## 2017-09-07 DIAGNOSIS — D638 Anemia in other chronic diseases classified elsewhere: Secondary | ICD-10-CM | POA: Diagnosis present

## 2017-09-07 DIAGNOSIS — H40033 Anatomical narrow angle, bilateral: Secondary | ICD-10-CM | POA: Diagnosis not present

## 2017-09-07 DIAGNOSIS — L03312 Cellulitis of back [any part except buttock]: Secondary | ICD-10-CM | POA: Diagnosis present

## 2017-09-07 DIAGNOSIS — G4733 Obstructive sleep apnea (adult) (pediatric): Secondary | ICD-10-CM | POA: Diagnosis not present

## 2017-09-07 DIAGNOSIS — R739 Hyperglycemia, unspecified: Secondary | ICD-10-CM | POA: Diagnosis not present

## 2017-09-07 DIAGNOSIS — F419 Anxiety disorder, unspecified: Secondary | ICD-10-CM | POA: Diagnosis not present

## 2017-09-07 DIAGNOSIS — L89150 Pressure ulcer of sacral region, unstageable: Secondary | ICD-10-CM | POA: Diagnosis present

## 2017-09-07 DIAGNOSIS — I110 Hypertensive heart disease with heart failure: Secondary | ICD-10-CM | POA: Diagnosis present

## 2017-09-07 DIAGNOSIS — M6281 Muscle weakness (generalized): Secondary | ICD-10-CM | POA: Diagnosis not present

## 2017-09-07 DIAGNOSIS — R52 Pain, unspecified: Secondary | ICD-10-CM | POA: Diagnosis not present

## 2017-09-07 DIAGNOSIS — D649 Anemia, unspecified: Secondary | ICD-10-CM | POA: Diagnosis not present

## 2017-09-07 DIAGNOSIS — R0609 Other forms of dyspnea: Secondary | ICD-10-CM | POA: Diagnosis not present

## 2017-09-07 DIAGNOSIS — G4489 Other headache syndrome: Secondary | ICD-10-CM | POA: Diagnosis not present

## 2017-09-07 DIAGNOSIS — I4891 Unspecified atrial fibrillation: Secondary | ICD-10-CM | POA: Diagnosis not present

## 2017-09-07 DIAGNOSIS — F411 Generalized anxiety disorder: Secondary | ICD-10-CM | POA: Diagnosis not present

## 2017-09-07 LAB — COMPREHENSIVE METABOLIC PANEL
ALT: 12 U/L (ref 0–44)
ANION GAP: 4 — AB (ref 5–15)
AST: 17 U/L (ref 15–41)
Albumin: 2.5 g/dL — ABNORMAL LOW (ref 3.5–5.0)
Alkaline Phosphatase: 65 U/L (ref 38–126)
BILIRUBIN TOTAL: 0.6 mg/dL (ref 0.3–1.2)
BUN: 14 mg/dL (ref 8–23)
CALCIUM: 8.6 mg/dL — AB (ref 8.9–10.3)
CO2: 31 mmol/L (ref 22–32)
Chloride: 105 mmol/L (ref 98–111)
Creatinine, Ser: 0.85 mg/dL (ref 0.44–1.00)
GFR calc non Af Amer: >60 mL/min (ref >60)
Glucose, Bld: 125 mg/dL — ABNORMAL HIGH (ref 70–99)
POTASSIUM: 4.2 mmol/L (ref 3.5–5.1)
SODIUM: 140 mmol/L (ref 135–145)
TOTAL PROTEIN: 5.9 g/dL — AB (ref 6.5–8.1)

## 2017-09-07 LAB — CBC WITH DIFFERENTIAL/PLATELET
ABS IMMATURE GRANULOCYTES: 0.1 10*3/uL (ref 0.0–0.1)
BASOS PCT: 1 %
Basophils Absolute: 0 10*3/uL (ref 0.0–0.1)
EOS ABS: 0.2 10*3/uL (ref 0.0–0.7)
Eosinophils Relative: 4 %
HCT: 31.4 % — ABNORMAL LOW (ref 36.0–46.0)
Hemoglobin: 9.3 g/dL — ABNORMAL LOW (ref 12.0–15.0)
Immature Granulocytes: 1 %
Lymphocytes Relative: 29 %
Lymphs Abs: 1.8 10*3/uL (ref 0.7–4.0)
MCH: 25.2 pg — ABNORMAL LOW (ref 26.0–34.0)
MCHC: 29.6 g/dL — ABNORMAL LOW (ref 30.0–36.0)
MCV: 85.1 fL (ref 78.0–100.0)
MONO ABS: 0.6 10*3/uL (ref 0.1–1.0)
MONOS PCT: 9 %
Neutro Abs: 3.5 10*3/uL (ref 1.7–7.7)
Neutrophils Relative %: 56 %
Platelets: 171 10*3/uL (ref 150–400)
RBC: 3.69 MIL/uL — ABNORMAL LOW (ref 3.87–5.11)
RDW: 17.1 % — AB (ref 11.5–15.5)
WBC: 6.3 10*3/uL (ref 4.0–10.5)

## 2017-09-07 LAB — GLUCOSE, CAPILLARY
GLUCOSE-CAPILLARY: 87 mg/dL (ref 70–99)
GLUCOSE-CAPILLARY: 146 mg/dL — AB (ref 70–99)
Glucose-Capillary: 179 mg/dL — ABNORMAL HIGH (ref 70–99)

## 2017-09-07 LAB — PROTIME-INR
INR: 2.71
PROTHROMBIN TIME: 28.5 s — AB (ref 11.4–15.2)

## 2017-09-07 LAB — MAGNESIUM: Magnesium: 2.1 mg/dL (ref 1.7–2.4)

## 2017-09-07 LAB — PHOSPHORUS: PHOSPHORUS: 3.7 mg/dL (ref 2.5–4.6)

## 2017-09-07 MED ORDER — WARFARIN SODIUM 5 MG PO TABS: 5.0000 mg | ORAL_TABLET | Freq: Once | ORAL | Status: DC

## 2017-09-07 MED ORDER — POTASSIUM CHLORIDE CRYS ER 20 MEQ PO TBCR: EXTENDED_RELEASE_TABLET | ORAL | 3 refills | Status: DC

## 2017-09-07 MED ORDER — METOLAZONE 10 MG PO TABS: 10.0000 mg | ORAL_TABLET | ORAL | 3 refills | Status: DC | PRN

## 2017-09-07 MED ORDER — OXYCODONE HCL 5 MG PO TABA: 1.0000 | ORAL_TABLET | Freq: Three times a day (TID) | ORAL | 0 refills | Status: DC | PRN

## 2017-09-07 MED ORDER — ALPRAZOLAM 0.25 MG PO TABS: 0.2500 mg | ORAL_TABLET | Freq: Three times a day (TID) | ORAL | 0 refills | Status: DC

## 2017-09-07 MED ORDER — FUROSEMIDE 80 MG PO TABS: 80.0000 mg | ORAL_TABLET | Freq: Two times a day (BID) | ORAL | 3 refills | Status: DC

## 2017-09-07 NOTE — Progress Notes (Signed)
ANTICOAGULATION CONSULT NOTE - Follow Up Consult  Pharmacy Consult for Warfarin Indication: atrial fibrillation  Allergies  Allergen Reactions   Bee Venom Hives    Takes benadryl   Fentanyl Other (See Comments)    Hallucinations, syncope     Metformin Diarrhea   Metformin And Related Diarrhea   Metformin Hcl Diarrhea   Other Other (See Comments)    Raw foods with seeds give her "boils".  Avoids raw strawberries, blueberries. Tolerates cooked fruits, tomato sauce, bread/grains with seeds. [facility] 10/17/13 Berries with seeds   Alteplase Rash   Cefepime Rash    Patient Measurements: Height: 5\' 11"  (180.3 cm) Weight: 282 lb 3 oz (128 kg) IBW/kg (Calculated) : 70.8  Vital Signs: Temp: 98.8 F (37.1 C) (07/08 0353) Temp Source: Oral (07/08 0353) BP: 87/52 (07/08 0812) Pulse Rate: 86 (07/08 0937)  Labs: Recent Labs    09/05/17 0341 09/06/17 0502 09/07/17 0332  HGB 10.5* 9.7* 9.3*  HCT 34.9* 33.0* 31.4*  PLT 157 144* 171  LABPROT 29.2* 27.8* 28.5*  INR 2.79 2.61 2.71  CREATININE 0.74 0.80 0.85    Estimated Creatinine Clearance: 96.3 mL/min (by C-G formula based on SCr of 0.85 mg/dL).  Assessment: 67 year old female on warfarin prior to admission for atrial fibrillation who presented to the ED on 7/5 with syncopal fall likely due to recurrent diarrhea. Patient denies hitting head or headache. No acute fractures on xray.    INR 2.61>>2.71. CBC is stable. No bleeding noted.   PTA regimen: 7.5mg  po daily except 10mg  of Tuesday  Goal of Therapy:  INR 2-3 Monitor platelets by anticoagulation protocol: Yes   Plan:  Warfarin 5 mg PO x1 tonight Daily INR Monitor for s/sx of bleeding   Juanell Fairly, PharmD PGY1 Pharmacy Resident Phone [phone] 09/07/2017 10:36 AM

## 2017-09-07 NOTE — Progress Notes (Signed)
Pt's iv removed. All belongings given to husband at bedside. Pt being transported to [facility] via [place]. Report given to [facility] RN @ [facility] Jerald Kief, RN

## 2017-09-07 NOTE — Care Management Important Message (Signed)
Important Message  Patient Details  Name: Donna [name] MRN: [id number] Date of Birth: 1950/04/25   Medicare Important Message Given:  Yes    Devyn Griffing P Silena Wyss 09/07/2017, 2:12 PM

## 2017-09-07 NOTE — Discharge Summary (Signed)
Physician Discharge Summary  Donna [name] XFG:[id number] DOB: 1950-07-10 DOA: 09/03/2017  PCP: Guadlupe Spanish, MD  Admit date: 09/03/2017 Discharge date: 09/07/2017  Admitted From: Home Disposition: SNF  Recommendations for Outpatient Follow-up:  1. Follow up with PCP in 1-2 weeks 2. Follow up with Cardiology Dr. Ellyn Hack as an outpatient to reinitiate diuretics  3. Please obtain CMP/CBC, Mag, Phos in one week 4. Please follow up on the following pending results:  Home Health: No  Equipment/Devices: None recommended by PT/OT  Discharge Condition: Stable CODE STATUS: FULL CODE  Diet recommendation: Soft Heart Healthy   Brief/Interim Summary: Donna [name] a 67 y.o.femalewith a Past Medical History of RLS; depression/anxiety; fibromyalgia; DM; afib on Coumadin; and chronic diastolic heart failure who presented with syncope leading to a fall. The patient reports having had about 3 weeks of diarrhea with fecal incontinence (she already had urinary incontinence); she went to her PCP and was diagnosed empirically with diverticulitis (no imaging performed) and given Cipro/Flagyl. The diarrhea was mostly in the evenings and would consist of 4-5 episodes nightly Around the same time, she began having pre-syncopal episodes where her vision would go dark but she was able to catch herself as she was falling. Her diarrhea did improve some after starting the medications but over the last few days it has returned. Overnight prior to admission, she got up to use the bathroom. This time, she fully lost consciousness and fell to the ground. She has chronic pain and acute pain from the fall, but she does not c/o back pain and only complains or Right Hip and Left knee pain. She was stabilized and Electrolytes improved and are all normal now.  A few days ago she was very tearful from hip pain along with left knee pain and was also found to be in A. fib with RVR. She was given one dose of Metoprolol IV and  HR improved.  Yesterday she was seen at bedside and prior to walking and she is wearing headphones watching TV.  As soon as I walked in and went to examine her she became very upset and tearful stating she was in excruciating pain and will lean the bed back to show that she was in pain. States that her right hip was hurting tremendously and that she had 2 episodes of diarrhea overnight. CT of Right Hip was done and showed no Fractures. Orthostatics were done and she was not orthostatic. Per nursing she refuses to walk but is agreeable to skilled nursing facility.  She is deemed medically stable to be discharged to skilled nursing facility and will need to follow-up with PCP and cardiology in outpatient setting  Discharge Diagnoses:  Principal Problem:   Syncope Active Problems:   Chronic diastolic heart failure (HCC)   Chronic atrial fibrillation ([place]): CHA2DS2-VASc Score 5; on Warfarin   Morbid obesity (HCC)   OSA (obstructive sleep apnea)   Venous stasis of both lower extremities   Essential hypertension   Fibromyalgia   Depression   Anxiety   Anticoagulant long-term use   Anatomical narrow angle borderline glaucoma of both eyes   Restless legs syndrome   History of seizures   Hypokalemia due to loss of potassium  Syncope likely orthostatic and from Hypovolemia  -Patient presented with recurrent presyncopal spells, but most recent episode appears to be more c/w true syncope -The most likely etiology currently appears to be from her recurrent diarrhea and critical hypokalemia -Will monitor on telemetry in SDU for now -Orthostatic  vital signs on admission and done this AM and she was not Orthostatic as BP actually improved  -She did not have any imaging of her brain; she reports stool and urinary incontinence as well as ataxia and some memory loss and so NPH is a consideration (she is extremely poorly conditioned and so there are other potential causes)  -Ordered non-contrast head CT  and showed Chronic atrophic changes without acute abnormality. -Trans Thoracic Echo performed and unremarkable with poor overall visualization; lower suspicion for structural heart disease as the etiology although conduction issues are a clear consideration given her markedly low K+ -C/w Neuro checks  -Checked A1c and ws 7.9, FLP as below, TSH normal at 0.777 -Continue ASA -PT/OT eval and treat and recommending SNF -IVF hydration Discontinued -Will hold Lasix and Metolazone at D/C and defer to Cardiology to re-initiate as an outpatient   Hypokalemia -Patient with marked hypokalemia but is improved and is now 4.2 -This is likely related to recurrent diarrhea and ongoing GI loss in hte setting of Lasix and Zaroxolyn use -Will follow closely. If she is having ongoing GI losses, more K+ repletion may be needed. -Mag given and will check Mag level.  -EKG with prolonged QT (611) on admission, so will need to avoid medications which can worsen this; repeat EKG q 6h for now to ensure this is improving with K+ repletion. -Repeat EKG now showing QTc of 486 -Repeat CMP in AM   Diarrhea, improving  -Uncertain etiology. -She had an empiric diagnosis of diverticulitis by her PCP recently (according to the patient) and was given Cipro/Flagyl with only transient improvement.  -Patient is being treated since August 26, 2017 with Cipro and Flagyl. Had had 1 bowel movement since admission until last night when she had 2   -Afebrile, white count was 19.0 on admission and trending dow and is now 6.3 (Likely some hemoconcentration as well) -Continued home Cipro and Flagyl to complete her therapyand now finished course  -Will order a CT of her abdomen with only PO contrast given her renal insufficiency. -CT Abd/Pelvis Normal -Consider stool studies including C diff based on CT results and if she is having ongoing issues but will hold off for now  -This is likely to source of her hypokalemia and so finding the  source of the diarrhea is important to preventing ongoing GI losses.  CAD -Last catheterization in September 2017, EKG atrial fibrillation, with LVH, with secondary repolarization abnormality, old inferior infarct, prolonged QT -Tn less than 0.03patient is cardiac pain free at this time. -Continue ASA, high dose statin, and resumed Metoprolol    Diastolic CHF -No acute decompensation, weight 276 lbsLast 2D echo December 2017 EF 55 to 03%, normal systolic -Continue to [employer] due to above and likely resume in a few days  -Resume Home BB -Obtain daily weights and Monitor intake and output; Patient is +1,881 mL and weight is the same as admission  -BNP was 137.8 -Continued with IV fluid hydration with normal saline to reduce rate to 75 mL's per hour and now further reduce to 50 mL/hr for 12 more hours and now stopped   -Continue to monitor volume status very carefully and continue to Hold Metolazone and Furosemide for now as she is Euvolemic   Atrial Fibrillation with RVR, improved  -Found to have a rapid ventricular rate and likely secondary to pain but Rate is better today  -CHA2DS2-VASc score5on anticoagulation with Coumadin -PT 28.5 and INR was 2.71 this AM -Resumed Metoprolol  Succinate 50 mg po Daily  -Given 1x of IV Metoprolol 5 mg two days ago and improved and if still maintaining elevated HR will start Cardizem -ContinueCoumadin per pharmacydosing  -If still unable to achieve HR Control will consult Cardiology for further assistance.   Hypertension -BP was on the softer Side earlier this AM at 87/52 -Continue to Hold Furosemide 80 mg po BID, Metolazone 10 mg po PRN for now -Resumed Metoprolol Succinate 50 mg po Daily  Hyperlipidemia -Lipid Panel showed cholesterol level 103, HDL of 34, LDL 54, Triglycerides of 73, VLDL of 15 -Continue Ezetimibe  Leukocytosis, improved -Likely reactive, UA is Negative, patient had a history of recent  diarrhea treated with Cipro and Flagyl, volume loss. -Non toxic appearing . WBC 19 and trending down and improved to 6.3 -Patient is Afebrile  -Will refrain from further antibiotic treatment for now, until patient is better hydrated, and if repeat CBC does not show improvement despite IV fluids -Continue to Monitor for S/Sx of Infection -Repeat CBC in AM   Type II Diabeteswith neuropathy -HbA1c is 7.9 -C/w Insulin Detemir 66 units sq qHS and Moderate Novolog SSI AC -C/w Gabapentin 600 mg po TID  -CBG's ranging from 87-230  Anxiety andMajorDepression/Fibromyalgia/ Chronic pain syndrome/ Insomnia -Continue Alprazolam 0.25 mg TID daily  -For now we will continue BuSpirone 10 mg po daily, Venalafaxine XR 150 mg po Daily, and Sertraline 100 mg po Daily  -Continue Gabapentin 600 mg po TID -Continue Lidocaine 5% 1 Patch and home Hydrocodone-Acetaminophen 1-2 tab po q4hprn -Try to minimize narcotics  Restless Leg Syndrome -Continue Pramipexole 0.125 mg po Daily   OSA -Continue CPAP nightly  Right Hip and Left Knee Pain, stable  -Obtained Right Hip X-Ray: No convincing evidence of hip fracture. If concern persists, CT imaging would be more sensitive. -Left Knee X-Ray showed No acute fracture or dislocation identified. Moderate tricompartmental osteoarthrosis greatest in the medial femorotibial compartment. -C/w Hydrocodone-Acetaminophen 1-2 tab po q4hprn Moderate pain and increased IV Morphine to 1 mg q3hprn -Also continue Lidocaine 5% patch and started Diclofenac Sodium 1% TD Gel 2 grams topically 4 times a day  -? How real her pain is given that she was watching a show on her tablet before I came in and was in No Acute Distress but as soon as I started talking to her she started screaming in pain.  -Add K Pad  -C/w PT/OT -Hip CT Done and showed No acute abnormality. Status post right hip replacement. Heterotopic ossification about the device is chronic. -Continue with  Current Pain regimen as above  Hypophosphatemia -Patient's phosphorus level was 1.9 and now is 3.7 -Continue to monitor and replete as necessary -Repeat phosphorus level in the a.m.  Normocytic Anemia -Patient's hemoglobin/hematocrit went from 11.8/38.9 is now 9.3/31.4 -Continue with ferrous sulfate 325 mg p.o. -Checked Anemia Panel and showed 24, U IBC 190, TIBC of 214, saturation ratios of 11%, ferritin level 316, folate level 7.0, vitamin B12 level of 445 -Continue to monitor for signs and symptoms of bleeding -Repeat CBC in AM.  Discharge Instructions Discharge Instructions    (HEART FAILURE PATIENTS) Call MD:  Anytime you have any of the following symptoms: 1) 3 pound weight gain in 24 hours or 5 pounds in 1 week 2) shortness of breath, with or without a dry hacking cough 3) swelling in the hands, feet or stomach 4) if you have to sleep on extra pillows at night in order to breathe.   Complete by:  As directed  Call MD for:  difficulty breathing, headache or visual disturbances   Complete by:  As directed    Call MD for:  extreme fatigue   Complete by:  As directed    Call MD for:  hives   Complete by:  As directed    Call MD for:  persistant dizziness or light-headedness   Complete by:  As directed    Call MD for:  persistant nausea and vomiting   Complete by:  As directed    Call MD for:  redness, tenderness, or signs of infection (pain, swelling, redness, odor or green/yellow discharge around incision site)   Complete by:  As directed    Call MD for:  severe uncontrolled pain   Complete by:  As directed    Call MD for:  temperature >100.4   Complete by:  As directed    Diet - low sodium heart healthy   Complete by:  As directed    Discharge instructions   Complete by:  As directed    Follow up with PCP and Cardiology in the outpatient setting. Take all medications as prescribed. If symptoms change or worsen please return to the ED for evaluation.   Increase  activity slowly   Complete by:  As directed      Allergies as of 09/07/2017      Reactions   Bee Venom Hives   Takes benadryl   Fentanyl Other (See Comments)   Hallucinations, syncope     Metformin Diarrhea   Metformin And Related Diarrhea   Metformin Hcl Diarrhea   Other Other (See Comments)   Raw foods with seeds give her "boils".  Avoids raw strawberries, blueberries. Tolerates cooked fruits, tomato sauce, bread/grains with seeds. [facility] 10/17[facility]13 Berries with seeds   Alteplase Rash   Cefepime Rash      Medication List    STOP taking these medications   CALCIUM 1000 + D PO   ciprofloxacin 500 MG tablet Commonly known as:  CIPRO   metroNIDAZOLE 500 MG tablet Commonly known as:  FLAGYL   tiZANidine 4 MG tablet Commonly known as:  ZANAFLEX     TAKE these medications   acetaminophen 500 MG tablet Commonly known as:  TYLENOL Take 500 mg by mouth every 6 (six) hours as needed for mild pain.   ALPRAZolam 0.25 MG tablet Commonly known as:  XANAX Take 1 tablet (0.25 mg total) by mouth 3 (three) times daily.   aspirin 81 MG tablet Take 81 mg by mouth daily.   busPIRone 5 MG tablet Commonly known as:  BUSPAR Take 10 mg by mouth daily.   ezetimibe 10 MG tablet Commonly known as:  ZETIA Take 10 mg by mouth daily.   ferrous sulfate 325 (65 FE) MG tablet Take 325 mg by mouth See admin instructions. Everyday except Tuesday, Thursday and Saturday   furosemide 80 MG tablet Commonly known as:  LASIX Take 1 tablet (80 mg total) by mouth 2 (two) times daily. HOLD UNTIL SEEN BY CARDIOLOGY OR PCP What changed:  additional instructions   gabapentin 600 MG tablet Commonly known as:  NEURONTIN Take 600 mg by mouth 3 (three) times daily.   guaiFENesin 600 MG 12 hr tablet Commonly known as:  MUCINEX Take 600 mg by mouth 2 (two) times daily as needed for cough or to loosen phlegm.   insulin lispro 100 UNIT/ML injection Commonly known as:  HUMALOG Inject 6-14 Units into the  skin 3 (three) times daily before meals. 150-200=4 units  201-250=6 units 251-300=8 units 301-350=10 units 351-400=12 units 400 and up 14 units and call MD   LEVEMIR 100 UNIT/ML injection Generic drug:  insulin detemir Inject 66 Units into the skin at bedtime.   lidocaine 5 % Commonly known as:  LIDODERM Place 1 patch onto the skin daily as needed (pain). Remove & Discard patch within 12 hours or as directed by MD   magnesium oxide 400 MG tablet Commonly known as:  MAG-OX Take 400 mg by mouth 3 (three) times daily.   Melatonin 300 MCG Tabs Take 1 tablet by mouth at bedtime as needed (sleep).   metolazone 10 MG tablet Commonly known as:  ZAROXOLYN Take 1 tablet (10 mg total) by mouth as needed (for swelling). HOLD UNTIL AFTER SEEN BY CARDIOLOGY OR PCP What changed:  additional instructions   metoprolol succinate 50 MG 24 hr tablet Commonly known as:  TOPROL-XL Take 1 tablet (50 mg total) by mouth daily. Take with or immediately following a meal.   OxyCODONE HCl ([employer] [place]) 5 MG [place] Commonly known as:  OXAYDO Take 1 tablet by mouth 3 (three) times daily as needed (pain).   potassium chloride SA 20 MEQ tablet Commonly known as:  K-DUR,KLOR-CON Take 2 tablets 40 meq twice a day; ONLY TAKE WHEN TAKING FUROSEMIDE What changed:  additional instructions   pramipexole 0.125 MG tablet Commonly known as:  MIRAPEX Take 0.125 mg by mouth daily.   sertraline 100 MG tablet Commonly known as:  ZOLOFT Take 100 mg by mouth daily.   TRULICITY [place] Inject 1.5 mg into the skin once a week.   Turmeric 500 MG Caps Take 1 capsule by mouth daily.   venlafaxine XR 150 MG 24 hr capsule Commonly known as:  EFFEXOR-XR Take 150 mg by mouth daily with breakfast.   Vitamin D 400 units capsule Take 4,000 Units by mouth daily.   warfarin 10 MG tablet Commonly known as:  COUMADIN Take 10 mg by mouth once a week. Tuesday   warfarin 7.5 MG tablet Commonly known as:  COUMADIN Take 7.5 mg  by mouth daily. Everyday except Tuesday       Contact information for follow-up providers    Guadlupe Spanish, MD. Call.   Specialty:  Internal Medicine Why:  Follow up within 1 week  Contact information: [address] [place] [address] [phone]        Leonie Man, MD .   Specialty:  Cardiology Contact information: [address] [place]  [address] [phone]            Contact information for after-discharge care    Destination    [facility] .   Service:  Skilled Nursing Contact information: [place] [phone]                 Allergies  Allergen Reactions  . Bee Venom Hives    Takes benadryl  . Fentanyl Other (See Comments)    Hallucinations, syncope    . Metformin Diarrhea  . Metformin And Related Diarrhea  . Metformin Hcl Diarrhea  . Other Other (See Comments)    Raw foods with seeds give her "boils".  Avoids raw strawberries, blueberries. Tolerates cooked fruits, tomato sauce, bread/grains with seeds. [facility] 10/17/13 Berries with seeds  . Alteplase Rash  . Cefepime Rash   Consultations:  None  Procedures/Studies: Ct Abdomen Pelvis Wo Contrast  Result Date: 09/03/2017 CLINICAL DATA:  Diarrhea and recent  syncopal episode EXAM: CT ABDOMEN AND PELVIS WITHOUT CONTRAST TECHNIQUE: Multidetector CT imaging of the abdomen and pelvis was performed following the standard protocol without IV contrast. COMPARISON:  03/19/2015 FINDINGS: Lower chest: Lung bases are free of acute infiltrate or sizable effusion. Hepatobiliary: No focal liver abnormality is seen. Status post cholecystectomy. No biliary dilatation. Pancreas: Unremarkable. No pancreatic ductal dilatation or surrounding inflammatory changes. Spleen: Normal in size without focal abnormality. Adrenals/Urinary Tract: Adrenal glands are within normal limits. Renal cystic change is noted. No renal calculi or  obstructive changes are seen. The bladder is partially distended. Stomach/Bowel: Minimal diverticular change of the colon is noted. The appendix is not well visualized and may have been surgically removed. No inflammatory changes to suggest appendicitis are noted. Small bowel is within normal limits. Vascular/Lymphatic: Aortic atherosclerosis. No enlarged abdominal or pelvic lymph nodes. Reproductive: Status post hysterectomy. No adnexal masses. Other: No abdominal wall hernia or abnormality. No abdominopelvic ascites. Musculoskeletal: Right hip replacement is noted. Degenerative changes of the lumbar spine are seen. IMPRESSION: No acute abnormality identified. Electronically Signed   By: Inez Catalina M.D.   On: 09/03/2017 12:59   Ct Head Wo Contrast  Result Date: 09/03/2017 CLINICAL DATA:  Recent syncopal episode EXAM: CT HEAD WITHOUT CONTRAST TECHNIQUE: Contiguous axial images were obtained from the base of the skull through the vertex without intravenous contrast. COMPARISON:  01/17/2015 FINDINGS: Brain: No evidence of acute infarction, hemorrhage, hydrocephalus, extra-axial collection or mass lesion/mass effect. Mild atrophic changes are noted. Vascular: No hyperdense vessel or unexpected calcification. Skull: Normal. Negative for fracture or focal lesion. Sinuses/Orbits: No acute finding. Other: None. IMPRESSION: Chronic atrophic changes without acute abnormality. Electronically Signed   By: Inez Catalina M.D.   On: 09/03/2017 12:56   Dg Pelvis Portable  Result Date: 09/03/2017 CLINICAL DATA:  67 y/o F; multiple syncopal episodes with fall. Right hip pain and knee pain. EXAM: PORTABLE PELVIS 1-2 VIEWS COMPARISON:  03/19/2015 CT abdomen and pelvis. FINDINGS: There is no evidence of pelvic fracture or diastasis. No pelvic bone lesions are seen. Right total hip arthroplasty partially visualized. IMPRESSION: No acute fracture or dislocation identified. Electronically Signed   By: Kristine Garbe M.D.    On: 09/03/2017 04:37   Ct Hip Right Wo Contrast  Result Date: 09/06/2017 CLINICAL DATA:  Right hip pain since a fall 09/03/2017. Initial encounter. EXAM: CT OF THE RIGHT HIP WITHOUT CONTRAST TECHNIQUE: Multidetector CT imaging of the right hip was performed according to the standard protocol. Multiplanar CT image reconstructions were also generated. COMPARISON:  Plain films right hip 09/05/2017. CT abdomen and pelvis 03/19/2015. FINDINGS: Bones/Joint/Cartilage Right hip arthroplasty is in place. The device is located. No fracture is identified. Heterotopic ossification is seen about the head and neck of the hip and is unchanged since the prior CT. No focal bone lesion is identified. Degenerative change is seen about the right SI joint. Ligaments Suboptimally assessed by CT. Muscles and Tendons Appear intact. Chronic ossification of the iliopsoas tendon is unchanged since the prior CT. Soft tissues Imaged intrapelvic contents demonstrate no acute abnormality. Atherosclerosis is noted. IMPRESSION: No acute abnormality. Status post right hip replacement. Heterotopic ossification about the device is chronic. Electronically Signed   By: Inge Rise M.D.   On: 09/06/2017 17:30   Dg Chest Portable 1 View  Result Date: 09/03/2017 CLINICAL DATA:  Multiple syncopal episodes with orthostatic changes. Her fall. EXAM: PORTABLE CHEST 1 VIEW COMPARISON:  11/06/2015 FINDINGS: Heart size and pulmonary vascularity are normal for technique.  Lungs are clear and expanded. No blunting of costophrenic angles. No pneumothorax. Mediastinal contours appear intact. Calcification of the aorta. IMPRESSION: No active disease. Electronically Signed   By: Lucienne Capers M.D.   On: 09/03/2017 04:40   Dg Knee Left Port  Result Date: 09/03/2017 CLINICAL DATA:  67 y/o F; multiple syncopal episodes and fall with knee pain. EXAM: PORTABLE LEFT KNEE - 1-2 VIEW COMPARISON:  None. FINDINGS: No evidence of fracture, dislocation, or joint  effusion. Moderate narrowing of the medial femorotibial compartment with sclerosis of articular surfaces. Tricompartmental osteophytosis. Vascular calcifications. Bones are demineralized. IMPRESSION: 1.  No acute fracture or dislocation identified. 2. Moderate tricompartmental osteoarthrosis greatest in the medial femorotibial compartment. Electronically Signed   By: Kristine Garbe M.D.   On: 09/03/2017 04:43   Dg Hip Unilat With Pelvis 2-3 Views Right  Result Date: 09/05/2017 CLINICAL DATA:  Right pain after fall. EXAM: DG HIP (WITH OR WITHOUT PELVIS) 2-3V RIGHT COMPARISON:  CT of the abdomen and pelvis and pelvis x-ray September 03, 2017. CT the abdomen and pelvis March 19, 2015. FINDINGS: Patient is status post right hip replacement. Femoral and acetabular components are in good position. Dystrophic calcifications in the soft tissues around the right hip are stable since 2017. No definitive acute fracture identified. Irregularity of the greater trochanter is stable. Mild lucency at the base of the greater trochanter on the third image extends outside the bone and is thought to be in the soft tissues. Visualized pelvic bones are normal. IMPRESSION: 1. No convincing evidence of hip fracture. If concern persists, CT imaging would be more sensitive. Electronically Signed   By: Dorise Bullion III M.D   On: 09/05/2017 11:11   ECHOCARDIOGRAM ------------------------------------------------------------------- Study Conclusions  - Left ventricle: The cavity size was normal. Wall thickness was increased in a pattern of mild LVH. Systolic function was normal. The estimated ejection fraction was in the range of 55% to 60%. Images were inadequate for LV wall motion assessment. Indeterminate diastolic function. - Aortic valve: Mildly to moderately calcified annulus. Trileaflet; mildly calcified leaflets. There was very mild stenosis. Mean gradient (S): 9 mm Hg. Peak gradient (S): 16 mm Hg.  VTI ratio of LVOT to aortic valve: 0.48. Valve area (VTI): 1.83 cm^2. Valve area (Vmax): 1.76 cm^2. Valve area (Vmean): 2 cm^2. - Mitral valve: Moderately calcified annulus. There was trivial regurgitation. - Left atrium: The atrium was moderately dilated. - Right atrium: Central venous pressure (est): 3 mm Hg. - Atrial septum: No defect or patent foramen ovale was identified. - Tricuspid valve: There was trivial regurgitation. - Pulmonary arteries: Systolic pressure could not be accurately estimated. - Pericardium, extracardiac: There was no pericardial effusion.  Subjective: Examined at bedside and states that she is a sore this morning when okay.  No chest pain, shortness breath, lightheadedness or dizziness.  No other concerns or complaints at this time and is agreeable to go to SNF.  Discharge Exam: Vitals:   09/07/17 0937 09/07/17 1000  BP:    Pulse: 86   Resp:  18  Temp:    SpO2:  93%   Vitals:   09/07/17 0552 09/07/17 0812 09/07/17 0937 09/07/17 1000  BP:  (!) 87/52    Pulse:  88 86   Resp:    18  Temp:      TempSrc:      SpO2:    93%  Weight: 128 kg (282 lb 3 oz)     Height:  General: Pt is alert, awake, not in acute distress Cardiovascular: Irregularly Irregular, S1/S2 +, no rubs, no gallops Respiratory: Diminished bilaterally, no wheezing, no rhonchi Abdominal: Soft, NT, Distended due to body habitus, bowel sounds + Extremities: Trace edema, no cyanosis  The results of significant diagnostics from this hospitalization (including imaging, microbiology, ancillary and laboratory) are listed below for reference.    Microbiology: No results found for this or any previous visit (from the past 240 hour(s)).   Labs: BNP (last 3 results) Recent Labs    09/03/17 0930  BNP 099.8*   Basic Metabolic Panel: Recent Labs  Lab 09/03/17 1356 09/03/17 2041 09/04/17 0236 09/04/17 0814 09/05/17 0341 09/06/17 0502 09/07/17 0332  NA 134* 132* 134*  --   137 138 140  K 2.8* 2.6* 3.4*  --  4.3 4.5 4.2  CL 90* 92* 95*  --  102 103 105  CO2 32 29 33*  --  29 29 31   GLUCOSE 242* 332* 235*  --  169* 188* 125*  BUN 30* 25* 20  --  10 13 14   CREATININE 1.28* 1.14* 0.99  --  0.74 0.80 0.85  CALCIUM 8.6* 8.4* 8.5*  --  8.4* 8.4* 8.6*  MG 2.5*  --   --  2.3 2.1 2.1 2.1  PHOS  --   --   --  2.0* 1.9* 3.8 3.7   Liver Function Tests: Recent Labs  Lab 09/05/17 0341 09/06/17 0502 09/07/17 0332  AST 16 15 17   ALT 11 12 12   ALKPHOS 61 62 65  BILITOT 0.9 0.9 0.6  PROT 5.6* 5.6* 5.9*  ALBUMIN 2.7* 2.5* 2.5*   No results for input(s): LIPASE, AMYLASE in the last 168 hours. No results for input(s): AMMONIA in the last 168 hours. CBC: Recent Labs  Lab 09/03/17 1356 09/04/17 0814 09/05/17 0341 09/06/17 0502 09/07/17 0332  WBC 14.7* 10.4 8.5 6.7 6.3  NEUTROABS  --  7.6 5.9 4.4 3.5  HGB 12.0 11.8* 10.5* 9.7* 9.3*  HCT 38.3 38.9 34.9* 33.0* 31.4*  MCV 81.5 82.2 83.5 86.2 85.1  PLT 212 179 157 144* 171   Cardiac Enzymes: Recent Labs  Lab 09/03/17 0527  TROPONINI <0.03   BNP: Invalid input(s): POCBNP CBG: Recent Labs  Lab 09/05/17 2151 09/06/17 0634 09/06/17 1626 09/06/17 2102 09/07/17 0626  GLUCAP 192* 175* 171* 230* 87   D-Dimer No results for input(s): DDIMER in the last 72 hours. Hgb A1c No results for input(s): HGBA1C in the last 72 hours. Lipid Profile No results for input(s): CHOL, HDL, LDLCALC, TRIG, CHOLHDL, LDLDIRECT in the last 72 hours. Thyroid function studies No results for input(s): TSH, T4TOTAL, T3FREE, THYROIDAB in the last 72 hours.  Invalid input(s): FREET3 Anemia work up Recent Labs    09/06/17 0502  VITAMINB12 445  FOLATE 7.0  FERRITIN 316*  TIBC 214*  IRON 24*  RETICCTPCT 2.2   Urinalysis    Component Value Date/Time   COLORURINE YELLOW 09/03/2017 [place] 09/03/2017 0250   LABSPEC 1.014 09/03/2017 0250   PHURINE 6.0 09/03/2017 0250   GLUCOSEU 150 (A) 09/03/2017 0250    HGBUR SMALL (A) 09/03/2017 0250   BILIRUBINUR NEGATIVE 09/03/2017 0250   KETONESUR NEGATIVE 09/03/2017 0250   PROTEINUR NEGATIVE 09/03/2017 0250   UROBILINOGEN 1.0 09/06/2010 1130   NITRITE NEGATIVE 09/03/2017 0250   LEUKOCYTESUR NEGATIVE 09/03/2017 0250   Sepsis Labs Invalid input(s): PROCALCITONIN,  WBC,  LACTICIDVEN Microbiology No results found for this or any previous visit (from  the past 240 hour(s)).  Time coordinating discharge: 35 minutes  SIGNED:  Kerney Elbe, DO [facility] 09/07/2017, 11:12 AM Pager [phone]  If 7PM-7AM, please contact night-coverage [email or web address] Password [email or web address]

## 2017-09-07 NOTE — Clinical Social Work Placement (Signed)
   CLINICAL SOCIAL WORK PLACEMENT  NOTE  Date:  09/07/2017  Patient Details  Name: Donna [name] MRN: [id number] Date of Birth: 15-Mar-1950  Clinical Social Work is seeking post-discharge placement for this patient at the [place] level of care (*CSW will initial, date and re-position this form in  chart as items are completed):  Yes   Patient/family provided with [place] Work Department's list of facilities offering this level of care within the geographic area requested by the patient (or if unable, by the patient's family).  Yes   Patient/family informed of their freedom to choose among providers that offer the needed level of care, that participate in Medicare, Medicaid or managed care program needed by the patient, have an available bed and are willing to accept the patient.  Yes   Patient/family informed of [place]'s ownership interest in [facility] and [facility], as well as of the fact that they are under no obligation to receive care at these facilities.  PASRR submitted to EDS on 09/06/17     PASRR number received on 09/07/17     Existing PASRR number confirmed on       FL2 transmitted to all facilities in geographic area requested by pt/family on       FL2 transmitted to all facilities within larger geographic area on       Patient informed that his/her managed care company has contracts with or will negotiate with certain facilities, including the following:        Yes   Patient/family informed of bed offers received.  Patient chooses bed at [facility]     Physician recommends and patient chooses bed at      Patient to be transferred to [place], [place] on 09/07/17.  Patient to be transferred to facility by [facility]     Patient family notified on 09/07/17 of transfer.  Name of family member notified:        PHYSICIAN       Additional Comment:     _______________________________________________ Candie Chroman, LCSW 09/07/2017, 12:55 PM

## 2017-09-07 NOTE — Clinical Social Work Note (Addendum)
Requested documents uploaded into [place] [facility] for PASARR review.  Dayton Scrape, [place]  12:32 pm PASARR obtained: [id number].  Dayton Scrape, [place]

## 2017-09-07 NOTE — Clinical Social Work Note (Signed)
CSW facilitated patient discharge including contacting patient family and facility to confirm patient discharge plans. Clinical information faxed to facility and family agreeable with plan. CSW arranged ambulance transport via [facility] to [place] at 2:00 pm. RN to call report prior to discharge [phone] Room 106).  CSW will sign off for now as social work intervention is no longer needed. Please consult [place] again if new needs arise.  Dayton Scrape, [place]

## 2017-09-08 NOTE — Consult Note (Addendum)
            [facility] CM Primary Care Navigator  09/08/2017  Donna [name] [birth year] [id number]   Went to see patient at the bedside to identify possible discharge needs but she was already discharged per staff. Patient was discharged to skilled nursing facility (SNF- [facility]) per therapy recommendation.  Per chart review, patient was admitted for syncope/ collapse that resulted to a fall.  Patient has discharge instruction to follow-up with primary care provider in 1 week post discharge.   For additional questions please contact:  Edwena Felty A. Allanah Mcfarland, BSN, RN-BC [facility] PRIMARY CARE Navigator Cell: [phone]

## 2017-09-09 ENCOUNTER — Ambulatory Visit (INDEPENDENT_AMBULATORY_CARE_PROVIDER_SITE_OTHER): Payer: Medicare Other | Admitting: Physician Assistant

## 2017-09-09 ENCOUNTER — Encounter: Payer: Self-pay | Admitting: Physician Assistant

## 2017-09-09 VITALS — BP 96/62 | HR 94 | Ht 70.0 in | Wt 288.0 lb

## 2017-09-09 DIAGNOSIS — Z794 Long term (current) use of insulin: Secondary | ICD-10-CM | POA: Diagnosis not present

## 2017-09-09 DIAGNOSIS — I482 Chronic atrial fibrillation, unspecified: Secondary | ICD-10-CM

## 2017-09-09 DIAGNOSIS — E119 Type 2 diabetes mellitus without complications: Secondary | ICD-10-CM | POA: Diagnosis not present

## 2017-09-09 DIAGNOSIS — I5032 Chronic diastolic (congestive) heart failure: Secondary | ICD-10-CM | POA: Diagnosis not present

## 2017-09-09 DIAGNOSIS — R55 Syncope and collapse: Secondary | ICD-10-CM

## 2017-09-09 MED ORDER — FUROSEMIDE 80 MG PO TABS: 80.0000 mg | ORAL_TABLET | Freq: Every day | ORAL | 2 refills | Status: DC

## 2017-09-09 MED ORDER — POTASSIUM CHLORIDE CRYS ER 20 MEQ PO TBCR: 40.0000 meq | EXTENDED_RELEASE_TABLET | Freq: Every day | ORAL | 3 refills | Status: DC

## 2017-09-09 MED ORDER — METOLAZONE 10 MG PO TABS: 10.0000 mg | ORAL_TABLET | ORAL | 3 refills | Status: DC | PRN

## 2017-09-09 NOTE — Progress Notes (Signed)
Cardiology Office Note    Date:  09/11/2017   ID:  Donna [name], DOB Apr 22, 1950, MRN [id number]  PCP:  Janine Limbo, PA-C  Cardiologist:  Dr. Ellyn Hack   Chief Complaint  Patient presents with  . Hospitalization Follow-up    Chronic Afib    History of Present Illness:  Donna [name] is a 67 y.o. female with PMH of chronic afib on Coumadin (INR managed by PCP), chronic LE edema, DM, RLS, and fibromyalgia.  There was a questionable history of congestive heart failure despite relatively normal echocardiogram and cardiac catheterization in 2017 showed no ischemia and normal cardiac pressure.  According to the previous office visit, there is some suspicion of obesity hypoventilation syndrome and obstructive sleep apnea on top of the chronic venous stasis contributing to the lower extremity edema.  She was last seen by Dr. Ellyn Hack in June 2019, she was instructed to take metolazone weekly basis on top of the Lasix.  Her lisinopril was stopped.  Her weight at the time was 276 pounds.  Unfortunately, patient was admitted in early May with syncope leading to a fall.  She reported 3 weeks of diarrhea with fecal incontinence on top of her baseline urinary incontinence.  She was seen by her PCP and empirically treated for diverticulitis with Cipro and Flagyl.  Due to persistent diarrhea, she started having presyncopal episodes where her vision would go dark.  On the night prior to admission, she fully lost consciousness and fell to the ground.  During the admission, she was also evaluated for right hip pain, CT did not show significant fractures.  CT of abdomen and pelvis was also normal.  She was treated for critical hypokalemia (K<2 on arrival) and dehydration (Cr 1.6 on arrival) as result of diarrhea.  Initial EKG showed QT prolongation and 611 on admission.  Repeat EKG showed QTC of 486 after treating potassium.  Repeat echocardiogram was unremarkable with overall poor image.  Lasix and Zaroxolyn were held  however planned to resume as outpatient once the patient is evaluated by cardiology service.  Patients presents today along with her husband.  She is feeling well since discharge.  Her blood pressure remain low on today's visit, however slightly improved compared to previous visit.  She has a chronic history of low blood pressure.  I am unable to reduce her metoprolol dosage due to the need to control atrial fibrillation.  Her lisinopril was discontinued during the last visit with Dr. Ellyn Hack.  On today's physical exam, she did gain some weight compared to previous visit however her lung is clear and that there is no significant JVD.  Given the low blood pressure, I will restart her at lower dose of diuretic.  I will restart her initially at 80 mg daily of Lasix.  I will continue to hold Zaroxolyn for now.  I will also restart her potassium supplement added 40 mEq daily.  Given the need for Coumadin, I will discontinue her aspirin.  She will need a basic metabolic panel either this Friday or next Monday.  Otherwise I will see her back in a few weeks for reassessment.  Depending on her volume status, I may increase her diuretic and potassium supplement to twice a day as before.  Overall, she is improving.  Her diarrhea has stopped.   Past Medical History:  Diagnosis Date  . CHF (congestive heart failure) ([place])   . Chronic /permanent atrial fibrillation ([place]) 2005   On Warfarin (follwed @ [facility]); Rate controlled - On BB  . Chronic diastolic heart failure, NYHA class 2 ([place])    Updated by A. fib, hypertensive heart disease and obesity; EDP was only 7 by cardiac catheterization in September 2017  . Diabetes mellitus without complication ([place])   . Diabetic lumbosacral plexopathy ([place])   . Fibromyalgia   . Generalized anxiety disorder   . Hypokalemia 09/03/2017  . Incontinence   . Major depressive disorder   . Restless leg syndrome   . Syncope 09/03/2017    Past Surgical  History:  Procedure Laterality Date  . ABDOMINAL HYSTERECTOMY    . CARDIAC CATHETERIZATION  11/09/2015   [facility]: Nonocclusive CAD. EF 55%. EDP 7 mmHg.  Marland Kitchen CHOLECYSTECTOMY    . HIP SURGERY Right    x2  . KNEE ARTHROSCOPY Right   . MASS EXCISION     sarcoma - shoulder  . ROTATOR CUFF REPAIR Bilateral   . TONSILLECTOMY    . TRANSTHORACIC ECHOCARDIOGRAM  11/2013; 02/2016   a) [facility]: with Definity:  EF 60-65%. Mod LA dilation. Mild AoV sclerosis. Normal RHP. Indeterminate LV filling pressures. b) [facility]: Normal cavity size. EF 55-60%.no RWMA, mild AoV sclerosis (R cusp), Mod MAC. Mild biAtrial dilation.    Current Medications: Outpatient Medications Prior to Visit  Medication Sig Dispense Refill  . acetaminophen (TYLENOL) 500 MG tablet Take 500 mg by mouth every 6 (six) hours as needed for mild pain.     Marland Kitchen ALPRAZolam (XANAX) 0.25 MG tablet Take 1 tablet (0.25 mg total) by mouth 3 (three) times daily. 10 tablet 0  . busPIRone (BUSPAR) 5 MG tablet Take 10 mg by mouth 2 (two) times daily.     . Cholecalciferol (VITAMIN D) 400 UNITS capsule Take 400 Units by mouth 2 (two) times daily.     . Dulaglutide (TRULICITY [place]) Inject 1.5 mg into the skin once a week.     . ezetimibe (ZETIA) 10 MG tablet Take 10 mg by mouth daily.    . ferrous sulfate 325 (65 FE) MG tablet Take 325 mg by mouth See admin instructions. Everyday except Tuesday, Thursday and Saturday    . gabapentin (NEURONTIN) 600 MG tablet Take 600 mg by mouth 3 (three) times daily.    Marland Kitchen guaiFENesin (MUCINEX) 600 MG 12 hr tablet Take 600 mg by mouth 2 (two) times daily as needed for cough or to loosen phlegm.     . insulin detemir (LEVEMIR) 100 UNIT/ML injection Inject 66 Units into the skin at bedtime.     . insulin lispro (HUMALOG) 100 UNIT/ML injection Inject 6-14 Units into the skin 3 (three) times daily before meals. 150-200=4 units 201-250=6 units 251-300=8 units 301-350=10 units 351-400=12 units 400 and up 14  units and call MD    . lidocaine (LIDODERM) 5 % Place 1 patch onto the skin daily as needed (pain). Remove & Discard patch within 12 hours or as directed by MD     . magnesium oxide (MAG-OX) 400 MG tablet Take 400 mg by mouth 3 (three) times daily.     . Melatonin 300 MCG TABS Take 1 tablet by mouth at bedtime as needed (sleep).     . metoprolol succinate (TOPROL-XL) 50 MG 24 hr tablet Take 1 tablet (50 mg total) by mouth daily. Take with or immediately following a meal. 90 tablet 3  . OxyCODONE HCl, Abuse Deter, (OXAYDO) 5 MG TABA Take 1 tablet by mouth 3 (three) times daily as needed (pain). 5 tablet 0  .  pramipexole (MIRAPEX) 0.125 MG tablet Take 0.125 mg by mouth daily.    . sertraline (ZOLOFT) 100 MG tablet Take 100 mg by mouth daily.    . Turmeric 500 MG CAPS Take 1 capsule by mouth daily.     Marland Kitchen venlafaxine XR (EFFEXOR-XR) 150 MG 24 hr capsule Take 150 mg by mouth daily with breakfast.    . warfarin (COUMADIN) 10 MG tablet Take 10 mg by mouth once a week. Tuesday    . warfarin (COUMADIN) 7.5 MG tablet Take 7.5 mg by mouth daily. Everyday except Tuesday    . aspirin 81 MG tablet Take 81 mg by mouth daily.    . furosemide (LASIX) 80 MG tablet Take 1 tablet (80 mg total) by mouth 2 (two) times daily. HOLD UNTIL SEEN BY CARDIOLOGY OR PCP 180 tablet 3  . metolazone (ZAROXOLYN) 10 MG tablet Take 1 tablet (10 mg total) by mouth as needed (for swelling). HOLD UNTIL AFTER SEEN BY CARDIOLOGY OR PCP 90 tablet 3  . potassium chloride SA (K-DUR,KLOR-CON) 20 MEQ tablet Take 2 tablets 40 meq twice a day; ONLY TAKE WHEN TAKING FUROSEMIDE 120 tablet 3   No facility-administered medications prior to visit.      Allergies:   Bee venom; Fentanyl; Metformin; Metformin and related; Metformin hcl; Other; Alteplase; and Cefepime   Social History   Socioeconomic History  . Marital status: Married    Spouse name: Not on file  . Number of children: Not on file  . Years of education: Not on file  . Highest  education level: Not on file  Occupational History  . Not on file  Social Needs  . Financial resource strain: Not on file  . Food insecurity:    Worry: Not on file    Inability: Not on file  . Transportation needs:    Medical: Not on file    Non-medical: Not on file  Tobacco Use  . Smoking status: Never Smoker  . Smokeless tobacco: Never Used  Substance and Sexual Activity  . Alcohol use: No  . Drug use: No  . Sexual activity: Not on file  Lifestyle  . Physical activity:    Days per week: Not on file    Minutes per session: Not on file  . Stress: Not on file  Relationships  . Social connections:    Talks on phone: Not on file    Gets together: Not on file    Attends religious service: Not on file    Active member of club or organization: Not on file    Attends meetings of clubs or organizations: Not on file    Relationship status: Not on file  Other Topics Concern  . Not on file  Social History Narrative  . Not on file     Family History:  The patient's family history includes Heart disease in her mother; Hypertension in her mother; Leukemia in her son.   ROS:   Please see the history of present illness.    ROS All other systems reviewed and are negative.   PHYSICAL EXAM:   VS:  BP 96/62   Pulse 94   Ht 5' 10"  (1.778 m)   Wt 288 lb (130.6 kg)   SpO2 96%   BMI 41.32 kg/m    GEN: Well nourished, well developed, in no acute distress  HEENT: normal  Neck: no JVD, carotid bruits, or masses Cardiac: Irregularly irregular; no murmurs, rubs, or gallops,no edema  Respiratory:  clear to auscultation bilaterally,  normal work of breathing GI: soft, nontender, nondistended, + BS MS: no deformity or atrophy  Skin: warm and dry, no rash Neuro:  Alert and Oriented x 3, Strength and sensation are intact Psych: euthymic mood, full affect  Wt Readings from Last 3 Encounters:  09/09/17 288 lb (130.6 kg)  09/07/17 282 lb 3 oz (128 kg)  08/06/17 276 lb (125.2 kg)       Studies/Labs Reviewed:   EKG:  EKG is not ordered today.    Recent Labs: 09/03/2017: B Natriuretic Peptide 137.8; TSH 0.777 09/07/2017: ALT 12; BUN 14; Creatinine, Ser 0.85; Hemoglobin 9.3; Magnesium 2.1; Platelets 171; Potassium 4.2; Sodium 140   Lipid Panel    Component Value Date/Time   CHOL 103 09/04/2017 0236   TRIG 73 09/04/2017 0236   HDL 34 (L) 09/04/2017 0236   CHOLHDL 3.0 09/04/2017 0236   VLDL 15 09/04/2017 0236   LDLCALC 54 09/04/2017 0236    Additional studies/ records that were reviewed today include:   Echo 09/03/2017 LV EF: 55% -   60%  ------------------------------------------------------------------- Indications:      Syncope 780.2. Study Conclusions  - Left ventricle: The cavity size was normal. Wall thickness was   increased in a pattern of mild LVH. Systolic function was normal.   The estimated ejection fraction was in the range of 55% to 60%.   Images were inadequate for LV wall motion assessment.   Indeterminate diastolic function. - Aortic valve: Mildly to moderately calcified annulus. Trileaflet;   mildly calcified leaflets. There was very mild stenosis. Mean   gradient (S): 9 mm Hg. Peak gradient (S): 16 mm Hg. VTI ratio of   LVOT to aortic valve: 0.48. Valve area (VTI): 1.83 cm^2. Valve   area (Vmax): 1.76 cm^2. Valve area (Vmean): 2 cm^2. - Mitral valve: Moderately calcified annulus. There was trivial   regurgitation. - Left atrium: The atrium was moderately dilated. - Right atrium: Central venous pressure (est): 3 mm Hg. - Atrial septum: No defect or patent foramen ovale was identified. - Tricuspid valve: There was trivial regurgitation. - Pulmonary arteries: Systolic pressure could not be accurately   estimated. - Pericardium, extracardiac: There was no pericardial effusion.    ASSESSMENT:    1. Chronic diastolic heart failure ([place])   2. Syncope, unspecified syncope type   3. Chronic atrial fibrillation ([place])   4. Controlled type  2 diabetes mellitus without complication, with long-term current use of insulin (HCC)      PLAN:  In order of problems listed above:  1. Syncope: Occurred in the setting of severe diarrhea and the fact she is on diuretic at the same time.  She presented with severe hypokalemia.  Since then her potassium has improved back up to normal.  Her diarrhea has stopped.  Both her Lasix and Zaroxolyn were held during the recent hospitalization.  Her weight is trending up, however she does not appears to be volume overloaded on physical exam, I will restart Lasix at 80 mg daily and potassium supplement at 40 mEq daily.  Note, this is half of her previous strength.  I will reassess her in a few weeks, and if the current diuretic is unable to stop the weight gain, I will titrate her diuretic further.  2. Chronic diastolic heart failure: Despite weight gain after the recent dehydration episode, she actually appears to be euvolemic on physical exam.  She has no lower extremity edema, her lung is clear and she has no JVD.  I will  restart her previous home dose of diuretic at half of the previous strength along with 40 mEq daily of potassium supplement.  3. Chronic atrial fibrillation: On Coumadin.  Continue 50 mg daily of Toprol-XL for rate control.  Unable to uptitrate medication further due to borderline low blood pressure  4. DM2: Managed by primary care provider    Medication Adjustments/Labs and Tests Ordered: Current medicines are reviewed at length with the patient today.  Concerns regarding medicines are outlined above.  Medication changes, Labs and Tests ordered today are listed in the Patient Instructions below. Patient Instructions  Medication Instructions:  RESTART Lasix 50m Take 1 tablet once a day RESTART Potassium 454m take 1 tablet once a day  HOLD Metolazone  DISCONTINUE Aspirin   Labwork: Your physician recommends that you return for lab work in: BMET-Friday (09/11/2017) OR  Monday(09/14/2017)  Testing/Procedures: None   Follow-Up: Your physician recommends that you schedule a follow-up appointment in: 1 week with HaAlmyra DeforestPA-C  Any Other Special Instructions Will Be Listed Below (If Applicable).  If you need a refill on your cardiac medications before your next appointment, please call your pharmacy.     SiHilbert CorriganPAUtah7/01/2018 11:05 PM    [email or web address]  [id number]: [phone]: [phone]

## 2017-09-09 NOTE — Patient Instructions (Addendum)
Medication Instructions:  RESTART Lasix 80mg  Take 1 tablet once a day RESTART Potassium 110meq take 1 tablet once a day  HOLD Metolazone  DISCONTINUE Aspirin   Labwork: Your physician recommends that you return for lab work in: BMET-Friday (09/11/2017) OR Monday(09/14/2017)  Testing/Procedures: None   Follow-Up: Your physician recommends that you schedule a follow-up appointment in: 1 week with Almyra Deforest, PA-C  Any Other Special Instructions Will Be Listed Below (If Applicable).  If you need a refill on your cardiac medications before your next appointment, please call your pharmacy.

## 2017-09-11 ENCOUNTER — Encounter: Payer: Self-pay | Admitting: Physician Assistant

## 2017-09-13 DIAGNOSIS — G4733 Obstructive sleep apnea (adult) (pediatric): Secondary | ICD-10-CM | POA: Diagnosis not present

## 2017-09-13 DIAGNOSIS — R0609 Other forms of dyspnea: Secondary | ICD-10-CM | POA: Diagnosis not present

## 2017-09-13 DIAGNOSIS — I10 Essential (primary) hypertension: Secondary | ICD-10-CM | POA: Diagnosis not present

## 2017-09-13 DIAGNOSIS — I482 Chronic atrial fibrillation: Secondary | ICD-10-CM | POA: Diagnosis not present

## 2017-09-13 DIAGNOSIS — E1140 Type 2 diabetes mellitus with diabetic neuropathy, unspecified: Secondary | ICD-10-CM | POA: Diagnosis not present

## 2017-09-13 DIAGNOSIS — R55 Syncope and collapse: Secondary | ICD-10-CM | POA: Diagnosis not present

## 2017-09-13 DIAGNOSIS — R072 Precordial pain: Secondary | ICD-10-CM | POA: Diagnosis not present

## 2017-09-13 DIAGNOSIS — R2689 Other abnormalities of gait and mobility: Secondary | ICD-10-CM | POA: Diagnosis not present

## 2017-09-23 ENCOUNTER — Encounter: Payer: Self-pay | Admitting: Physician Assistant

## 2017-09-23 ENCOUNTER — Ambulatory Visit (INDEPENDENT_AMBULATORY_CARE_PROVIDER_SITE_OTHER): Payer: Medicare Other | Admitting: Physician Assistant

## 2017-09-23 VITALS — BP 90/50 | HR 65 | Ht 70.0 in | Wt 283.0 lb

## 2017-09-23 DIAGNOSIS — I482 Chronic atrial fibrillation, unspecified: Secondary | ICD-10-CM

## 2017-09-23 DIAGNOSIS — I952 Hypotension due to drugs: Secondary | ICD-10-CM | POA: Diagnosis not present

## 2017-09-23 DIAGNOSIS — G4489 Other headache syndrome: Secondary | ICD-10-CM

## 2017-09-23 DIAGNOSIS — E119 Type 2 diabetes mellitus without complications: Secondary | ICD-10-CM | POA: Diagnosis not present

## 2017-09-23 DIAGNOSIS — R600 Localized edema: Secondary | ICD-10-CM

## 2017-09-23 DIAGNOSIS — R531 Weakness: Secondary | ICD-10-CM

## 2017-09-23 MED ORDER — METOPROLOL SUCCINATE ER 25 MG PO TB24: 25.0000 mg | ORAL_TABLET | Freq: Every day | ORAL | 5 refills | Status: DC

## 2017-09-23 NOTE — Progress Notes (Signed)
Cardiology Office Note    Date:  09/25/2017   ID:  Donna [name], DOB 10-Jan-1951, MRN [id number]  PCP:  Janine Limbo, PA-C  Cardiologist:  Dr. Ellyn Hack   Chief Complaint  Patient presents with  . Follow-up    seen for Dr. Ellyn Hack.     History of Present Illness:  Donna [name] is a 67 y.o. female with PMH of chronic afib on Coumadin (INR managed by PCP), chronic LE edema, DM, RLS, and fibromyalgia.  There was a questionable history of congestive heart failure despite relatively normal echocardiogram and cardiac catheterization in 2017 showed no ischemia and normal cardiac pressure.  There has been some suspicion of obesity hypoventilation syndrome and obstructive sleep apnea on top of the chronic venous stasis contributing to the lower extremity edema.  She was last seen by Dr. Ellyn Hack in June 2019, she was instructed to take metolazone on a weekly basis on top of the Lasix.  Her lisinopril was stopped.  Her weight at the time was 276 pounds.  Unfortunately, patient was admitted in early May with syncope leading to a fall.  She reported 3 weeks of diarrhea with fecal incontinence on top of her baseline urinary incontinence.  She was seen by her PCP and empirically treated for diverticulitis with Cipro and Flagyl.  Due to persistent diarrhea, she started having presyncopal episodes where her vision would go dark.  On the night prior to admission, she fully lost consciousness and fell to the ground.  During the admission, she was also evaluated for right hip pain, CT did not show significant fractures.  CT of abdomen and pelvis was also normal.  She was treated for critical hypokalemia (K<2 on arrival) and dehydration (Cr 1.6 on arrival) as result of diarrhea.  Initial EKG showed QT prolongation and 611 on admission.  Repeat EKG showed QTC of 486 after treating potassium.  Repeat echocardiogram was unremarkable with overall poor image.  Lasix and Zaroxolyn were held however planned to resume as  outpatient once the patient is evaluated by cardiology service.  I last saw the patient on 09/09/2017, he was feeling well since the discharge.  Her systolic blood pressure was 96 however stated that she has chronic history of low blood pressure.  Given her low blood pressure, I restarted her on a lower dose of diuretic.  I restarted her initially at 80 mg daily of Lasix along with 40 mEq daily of potassium.  She presents today for cardiology office visit.  Her blood pressure remain very low.  She started having diarrhea again in the last 2 days and has been feeling very poorly.  I asked her to hold the diuretic for the next few days before restarting at a lower dose at 40 mg daily.  I discussed the case with DOD Dr. Ellyn Hack as well.  She is at very high risk for readmission.  I will obtain a basic metabolic panel to make sure her potassium is okay.  She will also need a CBC as well.  Although she does have some degree of lower extremity edema.  However, I do not think she is significantly volume overloaded.  Her lung is clear.  For the past few days, she also has noticed she started having left-sided weakness as well as significant headache.  I recommend a CT of the head to rule out stroke.    Past Medical History:  Diagnosis Date  . CHF (congestive heart failure) ([place])   . Chronic /permanent atrial  fibrillation ([place]) 2005   On Warfarin (follwed @ [facility]); Rate controlled - On BB  . Chronic diastolic heart failure, NYHA class 2 ([place])    Updated by A. fib, hypertensive heart disease and obesity; EDP was only 7 by cardiac catheterization in September 2017  . Diabetes mellitus without complication ([place])   . Diabetic lumbosacral plexopathy ([place])   . Fibromyalgia   . Generalized anxiety disorder   . Hypokalemia 09/03/2017  . Incontinence   . Major depressive disorder   . Restless leg syndrome   . Syncope 09/03/2017    Past Surgical History:  Procedure Laterality Date    . ABDOMINAL HYSTERECTOMY    . CARDIAC CATHETERIZATION  11/09/2015   [facility]: Nonocclusive CAD. EF 55%. EDP 7 mmHg.  Marland Kitchen CHOLECYSTECTOMY    . HIP SURGERY Right    x2  . KNEE ARTHROSCOPY Right   . MASS EXCISION     sarcoma - shoulder  . ROTATOR CUFF REPAIR Bilateral   . TONSILLECTOMY    . TRANSTHORACIC ECHOCARDIOGRAM  11/2013; 02/2016   a) [facility]: with Definity:  EF 60-65%. Mod LA dilation. Mild AoV sclerosis. Normal RHP. Indeterminate LV filling pressures. b) [facility]: Normal cavity size. EF 55-60%.no RWMA, mild AoV sclerosis (R cusp), Mod MAC. Mild biAtrial dilation.    Current Medications: Outpatient Medications Prior to Visit  Medication Sig Dispense Refill  . acetaminophen (TYLENOL) 500 MG tablet Take 500 mg by mouth every 6 (six) hours as needed for mild pain.     Marland Kitchen ALPRAZolam (XANAX) 0.25 MG tablet Take 1 tablet (0.25 mg total) by mouth 3 (three) times daily. 10 tablet 0  . busPIRone (BUSPAR) 5 MG tablet Take 10 mg by mouth 2 (two) times daily.     . Cholecalciferol (VITAMIN D) 400 UNITS capsule Take 400 Units by mouth 2 (two) times daily.     . Dulaglutide (TRULICITY [place]) Inject 1.5 mg into the skin once a week.     . ezetimibe (ZETIA) 10 MG tablet Take 10 mg by mouth daily.    . ferrous sulfate 325 (65 FE) MG tablet Take 325 mg by mouth See admin instructions. Everyday except Tuesday, Thursday and Saturday    . furosemide (LASIX) 80 MG tablet Take 1 tablet (80 mg total) by mouth daily. 30 tablet 2  . gabapentin (NEURONTIN) 600 MG tablet Take 600 mg by mouth 3 (three) times daily.    Marland Kitchen guaiFENesin (MUCINEX) 600 MG 12 hr tablet Take 600 mg by mouth 2 (two) times daily as needed for cough or to loosen phlegm.     . insulin detemir (LEVEMIR) 100 UNIT/ML injection Inject 66 Units into the skin at bedtime.     . insulin lispro (HUMALOG) 100 UNIT/ML injection Inject 6-14 Units into the skin 3 (three) times daily before meals. 150-200=4 units 201-250=6 units 251-300=8  units 301-350=10 units 351-400=12 units 400 and up 14 units and call MD    . lidocaine (LIDODERM) 5 % Place 1 patch onto the skin daily as needed (pain). Remove & Discard patch within 12 hours or as directed by MD     . magnesium oxide (MAG-OX) 400 MG tablet Take 400 mg by mouth 3 (three) times daily.     . Melatonin 300 MCG TABS Take 1 tablet by mouth at bedtime as needed (sleep).     . metolazone (ZAROXOLYN) 10 MG tablet Take 1 tablet (10 mg total) by mouth as needed (for swelling). ON HOLD PER  CARDIOLOGY 90 tablet 3  . OxyCODONE HCl, Abuse Deter, (OXAYDO) 5 MG TABA Take 1 tablet by mouth 3 (three) times daily as needed (pain). 5 tablet 0  . potassium chloride SA (K-DUR,KLOR-CON) 20 MEQ tablet Take 2 tablets (40 mEq total) by mouth daily. 60 tablet 3  . pramipexole (MIRAPEX) 0.125 MG tablet Take 0.125 mg by mouth daily.    . sertraline (ZOLOFT) 100 MG tablet Take 100 mg by mouth daily.    . Turmeric 500 MG CAPS Take 1 capsule by mouth daily.     Marland Kitchen venlafaxine XR (EFFEXOR-XR) 150 MG 24 hr capsule Take 150 mg by mouth daily with breakfast.    . warfarin (COUMADIN) 10 MG tablet Take 10 mg by mouth once a week. Tuesday    . warfarin (COUMADIN) 7.5 MG tablet Take 7.5 mg by mouth daily. Everyday except Tuesday    . metoprolol succinate (TOPROL-XL) 50 MG 24 hr tablet Take 1 tablet (50 mg total) by mouth daily. Take with or immediately following a meal. 90 tablet 3   No facility-administered medications prior to visit.      Allergies:   Bee venom; Fentanyl; Metformin; Metformin and related; Metformin hcl; Other; Alteplase; and Cefepime   Social History   Socioeconomic History  . Marital status: Married    Spouse name: Not on file  . Number of children: Not on file  . Years of education: Not on file  . Highest education level: Not on file  Occupational History  . Not on file  Social Needs  . Financial resource strain: Not on file  . Food insecurity:    Worry: Not on file    Inability:  Not on file  . Transportation needs:    Medical: Not on file    Non-medical: Not on file  Tobacco Use  . Smoking status: Never Smoker  . Smokeless tobacco: Never Used  Substance and Sexual Activity  . Alcohol use: No  . Drug use: No  . Sexual activity: Not on file  Lifestyle  . Physical activity:    Days per week: Not on file    Minutes per session: Not on file  . Stress: Not on file  Relationships  . Social connections:    Talks on phone: Not on file    Gets together: Not on file    Attends religious service: Not on file    Active member of club or organization: Not on file    Attends meetings of clubs or organizations: Not on file    Relationship status: Not on file  Other Topics Concern  . Not on file  Social History Narrative  . Not on file     Family History:  The patient's family history includes Heart disease in her mother; Hypertension in her mother; Leukemia in her son.   ROS:   Please see the history of present illness.    ROS All other systems reviewed and are negative.   PHYSICAL EXAM:   VS:  BP (!) 90/50   Pulse 65   Ht _0  (1.778 m)   Wt 283 lb (128.4 kg)   SpO2 93%   BMI 40.61 kg/m    GEN: chronically ill, seating in wheelchair HEENT: normal  Neck: no JVD, carotid bruits, or masses Cardiac: Irregular; no murmurs, rubs, or gallops. 2+ edema  Respiratory:  clear to auscultation bilaterally, normal work of breathing GI: soft, nontender, nondistended, + BS MS: no deformity or atrophy  Skin: warm and dry,  no rash Neuro:  Alert and Oriented x 3, Strength and sensation are intact Psych: euthymic mood, full affect  Wt Readings from Last 3 Encounters:  09/23/17 283 lb (128.4 kg)  09/09/17 288 lb (130.6 kg)  09/07/17 282 lb 3 oz (128 kg)      Studies/Labs Reviewed:   EKG:  EKG is not ordered today.   Recent Labs: 09/03/2017: B Natriuretic Peptide 137.8; TSH 0.777 09/07/2017: ALT 12; Magnesium 2.1 09/23/2017: BUN 14; Creatinine, Ser 0.96;  Hemoglobin 8.8; Platelets 226; Potassium 3.8; Sodium 139   Lipid Panel    Component Value Date/Time   CHOL 103 09/04/2017 0236   TRIG 73 09/04/2017 0236   HDL 34 (L) 09/04/2017 0236   CHOLHDL 3.0 09/04/2017 0236   VLDL 15 09/04/2017 0236   LDLCALC 54 09/04/2017 0236    Additional studies/ records that were reviewed today include:   Echo 09/03/2017 LV EF: 55% -   60% Study Conclusions  - Left ventricle: The cavity size was normal. Wall thickness was   increased in a pattern of mild LVH. Systolic function was normal.   The estimated ejection fraction was in the range of 55% to 60%.   Images were inadequate for LV wall motion assessment.   Indeterminate diastolic function. - Aortic valve: Mildly to moderately calcified annulus. Trileaflet;   mildly calcified leaflets. There was very mild stenosis. Mean   gradient (S): 9 mm Hg. Peak gradient (S): 16 mm Hg. VTI ratio of   LVOT to aortic valve: 0.48. Valve area (VTI): 1.83 cm^2. Valve   area (Vmax): 1.76 cm^2. Valve area (Vmean): 2 cm^2. - Mitral valve: Moderately calcified annulus. There was trivial   regurgitation. - Left atrium: The atrium was moderately dilated. - Right atrium: Central venous pressure (est): 3 mm Hg. - Atrial septum: No defect or patent foramen ovale was identified. - Tricuspid valve: There was trivial regurgitation. - Pulmonary arteries: Systolic pressure could not be accurately   estimated. - Pericardium, extracardiac: There was no pericardial effusion.   ASSESSMENT:    1. Left-sided weakness   2. Other headache syndrome   3. Chronic atrial fibrillation ([place])   4. Leg edema   5. Controlled type 2 diabetes mellitus without complication, without long-term current use of insulin ([place])   6. Weakness   7. Hypotension due to drugs      PLAN:  In order of problems listed above:  1. Left-sided weakness: Obtain head CT to rule out stroke.  She also has generalized weakness as well.  Obtain CBC to rule out  anemia.  2. Chronic atrial fibrillation: On Coumadin, followed by primary care provider.  Rate controlled on current medication  3. Leg edema: This is chronic for her, she has severe dehydration in the hypokalemia when she was on 80 mg twice daily of Lasix and 40 mg twice daily of potassium.  Since her recent discharge, I restarted her on 80 mg daily of Lasix and 40 meq daily of potassium.  My current concern with her low blood pressure is she is still dry, her lower extremity edema could be just dependent edema instead of volume overload, I discussed case with Dr. Ellyn Hack, I have instructed the patient to hold both Lasix and the potassium for 3 days before starting at the even lower dose at 40 mg daily of Lasix and 20 mEq daily of potassium.  4. DM2: Managed by primary care providers.  5. Hypotension: Reduce metoprolol to succinate to 25 mg daily.  Medication Adjustments/Labs and Tests Ordered: Current medicines are reviewed at length with the patient today.  Concerns regarding medicines are outlined above.  Medication changes, Labs and Tests ordered today are listed in the Patient Instructions below. Patient Instructions  Medication Instructions:   START TAKING  METOPROLOL SUCCINATE 25 MG ONCE A DAY   FOR 3 DAYS :  HOLD POTASSIUM  AND LASIX   AFTER 3 DAYS : September 27 2017  START  TAKING LASIX 40 MG ONCE A DAY    START  TAKING  POTASSIUM  20 MEQ ONCE  DAY    If you need a refill on your cardiac medications before your next appointment, please call your pharmacy.  Labwork:  CBC AND BMP TODAY    Testing/Procedures:  Non-Cardiac CT scanning, (CAT scanning), is a noninvasive, special x-ray that produces cross-sectional images of the body using x-rays and a computer. CT scans help physicians diagnose and treat medical conditions. For some CT exams, a contrast material is used to enhance visibility in the area of the body being studied. CT scans provide greater clarity and reveal more  details than regular x-ray exams.   Follow-Up:   IN  ONE TO TWO WEEKS  WITH Wynter Grave ( USE HOLD SPOTS IF NEED BE)   Any Other Special Instructions Will Be Listed Below (If Applicable).       Hilbert Corrigan, [place]  09/25/2017 1:52 PM    [facility] [place], [place], [place]  [address] Phone: [phone]; Fax: [phone]

## 2017-09-23 NOTE — Patient Instructions (Addendum)
Medication Instructions:   START TAKING  METOPROLOL SUCCINATE 25 MG ONCE A DAY   FOR 3 DAYS :  HOLD POTASSIUM  AND LASIX   AFTER 3 DAYS : September 27 2017  START  TAKING LASIX 40 MG ONCE A DAY    START  TAKING  POTASSIUM  20 MEQ ONCE  DAY    If you need a refill on your cardiac medications before your next appointment, please call your pharmacy.  Labwork:  CBC AND BMP TODAY    Testing/Procedures:  Non-Cardiac CT scanning, (CAT scanning), is a noninvasive, special x-ray that produces cross-sectional images of the body using x-rays and a computer. CT scans help physicians diagnose and treat medical conditions. For some CT exams, a contrast material is used to enhance visibility in the area of the body being studied. CT scans provide greater clarity and reveal more details than regular x-ray exams.   Follow-Up:   IN  ONE TO TWO WEEKS  WITH HAO MENG ( USE HOLD SPOTS IF NEED BE)   Any Other Special Instructions Will Be Listed Below (If Applicable).

## 2017-09-24 ENCOUNTER — Ambulatory Visit (INDEPENDENT_AMBULATORY_CARE_PROVIDER_SITE_OTHER)
Admission: RE | Admit: 2017-09-24 | Discharge: 2017-09-24 | Disposition: A | Discharge status: Home / Self Care | Payer: Medicare Other | Source: Ambulatory Visit | Attending: Physician Assistant | Admitting: Physician Assistant

## 2017-09-24 DIAGNOSIS — G4489 Other headache syndrome: Secondary | ICD-10-CM

## 2017-09-24 DIAGNOSIS — R42 Dizziness and giddiness: Secondary | ICD-10-CM | POA: Diagnosis not present

## 2017-09-24 DIAGNOSIS — R55 Syncope and collapse: Secondary | ICD-10-CM | POA: Diagnosis not present

## 2017-09-24 DIAGNOSIS — R51 Headache: Secondary | ICD-10-CM | POA: Diagnosis not present

## 2017-09-24 LAB — BASIC METABOLIC PANEL
BUN/Creatinine Ratio: 15 (ref 12–28)
BUN: 14 mg/dL (ref 8–27)
CO2: 28 mmol/L (ref 20–29)
Calcium: 8.7 mg/dL (ref 8.7–10.3)
Chloride: 98 mmol/L (ref 96–106)
Creatinine, Ser: 0.96 mg/dL (ref 0.57–1.00)
GFR calc Af Amer: 71 mL/min/{1.73_m2} (ref >59)
GFR calc non Af Amer: 62 mL/min/{1.73_m2} (ref >59)
Glucose: 96 mg/dL (ref 65–99)
POTASSIUM: 3.8 mmol/L (ref 3.5–5.2)
SODIUM: 139 mmol/L (ref 134–144)

## 2017-09-24 LAB — CBC
Hematocrit: 28.4 % — ABNORMAL LOW (ref 34.0–46.6)
Hemoglobin: 8.8 g/dL — ABNORMAL LOW (ref 11.1–15.9)
MCH: 24.4 pg — ABNORMAL LOW (ref 26.6–33.0)
MCHC: 31 g/dL — ABNORMAL LOW (ref 31.5–35.7)
MCV: 79 fL (ref 79–97)
Platelets: 226 10*3/uL (ref 150–450)
RBC: 3.61 x10E6/uL — ABNORMAL LOW (ref 3.77–5.28)
RDW: 16.1 % — ABNORMAL HIGH (ref 12.3–15.4)
WBC: 10.6 10*3/uL (ref 3.4–10.8)

## 2017-09-25 ENCOUNTER — Inpatient Hospital Stay (HOSPITAL_COMMUNITY)
Admission: EM | Admit: 2017-09-25 | Discharge: 2017-09-30 | DRG: 309 | Disposition: A | Discharge status: Skilled Nursing Facility | Payer: Medicare Other | Attending: Internal Medicine | Admitting: Internal Medicine

## 2017-09-25 ENCOUNTER — Emergency Department (HOSPITAL_COMMUNITY): Payer: Medicare Other

## 2017-09-25 ENCOUNTER — Encounter (HOSPITAL_COMMUNITY): Payer: Self-pay | Admitting: Emergency Medicine

## 2017-09-25 ENCOUNTER — Other Ambulatory Visit: Payer: Self-pay

## 2017-09-25 ENCOUNTER — Encounter: Payer: Self-pay | Admitting: Physician Assistant

## 2017-09-25 DIAGNOSIS — R52 Pain, unspecified: Secondary | ICD-10-CM | POA: Diagnosis not present

## 2017-09-25 DIAGNOSIS — R21 Rash and other nonspecific skin eruption: Secondary | ICD-10-CM | POA: Diagnosis not present

## 2017-09-25 DIAGNOSIS — E860 Dehydration: Secondary | ICD-10-CM | POA: Diagnosis not present

## 2017-09-25 DIAGNOSIS — G8929 Other chronic pain: Secondary | ICD-10-CM | POA: Diagnosis present

## 2017-09-25 DIAGNOSIS — I4891 Unspecified atrial fibrillation: Secondary | ICD-10-CM | POA: Diagnosis present

## 2017-09-25 DIAGNOSIS — D649 Anemia, unspecified: Secondary | ICD-10-CM | POA: Diagnosis present

## 2017-09-25 DIAGNOSIS — R278 Other lack of coordination: Secondary | ICD-10-CM | POA: Diagnosis not present

## 2017-09-25 DIAGNOSIS — K5900 Constipation, unspecified: Secondary | ICD-10-CM | POA: Diagnosis not present

## 2017-09-25 DIAGNOSIS — F419 Anxiety disorder, unspecified: Secondary | ICD-10-CM

## 2017-09-25 DIAGNOSIS — R05 Cough: Secondary | ICD-10-CM | POA: Diagnosis not present

## 2017-09-25 DIAGNOSIS — Z794 Long term (current) use of insulin: Secondary | ICD-10-CM

## 2017-09-25 DIAGNOSIS — R935 Abnormal findings on diagnostic imaging of other abdominal regions, including retroperitoneum: Secondary | ICD-10-CM | POA: Diagnosis not present

## 2017-09-25 DIAGNOSIS — M1712 Unilateral primary osteoarthritis, left knee: Secondary | ICD-10-CM | POA: Diagnosis not present

## 2017-09-25 DIAGNOSIS — I482 Chronic atrial fibrillation, unspecified: Secondary | ICD-10-CM | POA: Insufficient documentation

## 2017-09-25 DIAGNOSIS — F329 Major depressive disorder, single episode, unspecified: Secondary | ICD-10-CM

## 2017-09-25 DIAGNOSIS — R0902 Hypoxemia: Secondary | ICD-10-CM | POA: Diagnosis not present

## 2017-09-25 DIAGNOSIS — E559 Vitamin D deficiency, unspecified: Secondary | ICD-10-CM | POA: Diagnosis not present

## 2017-09-25 DIAGNOSIS — L89150 Pressure ulcer of sacral region, unstageable: Secondary | ICD-10-CM | POA: Diagnosis present

## 2017-09-25 DIAGNOSIS — M1612 Unilateral primary osteoarthritis, left hip: Secondary | ICD-10-CM | POA: Diagnosis present

## 2017-09-25 DIAGNOSIS — Z993 Dependence on wheelchair: Secondary | ICD-10-CM

## 2017-09-25 DIAGNOSIS — I5032 Chronic diastolic (congestive) heart failure: Secondary | ICD-10-CM | POA: Diagnosis present

## 2017-09-25 DIAGNOSIS — M62838 Other muscle spasm: Secondary | ICD-10-CM | POA: Diagnosis not present

## 2017-09-25 DIAGNOSIS — Z7901 Long term (current) use of anticoagulants: Secondary | ICD-10-CM | POA: Diagnosis not present

## 2017-09-25 DIAGNOSIS — D638 Anemia in other chronic diseases classified elsewhere: Secondary | ICD-10-CM | POA: Diagnosis present

## 2017-09-25 DIAGNOSIS — L089 Local infection of the skin and subcutaneous tissue, unspecified: Secondary | ICD-10-CM | POA: Diagnosis not present

## 2017-09-25 DIAGNOSIS — M2550 Pain in unspecified joint: Secondary | ICD-10-CM | POA: Diagnosis not present

## 2017-09-25 DIAGNOSIS — R110 Nausea: Secondary | ICD-10-CM | POA: Diagnosis not present

## 2017-09-25 DIAGNOSIS — L89322 Pressure ulcer of left buttock, stage 2: Secondary | ICD-10-CM | POA: Diagnosis not present

## 2017-09-25 DIAGNOSIS — M797 Fibromyalgia: Secondary | ICD-10-CM | POA: Diagnosis present

## 2017-09-25 DIAGNOSIS — L03312 Cellulitis of back [any part except buttock]: Secondary | ICD-10-CM | POA: Diagnosis present

## 2017-09-25 DIAGNOSIS — Z6841 Body Mass Index (BMI) 40.0 and over, adult: Secondary | ICD-10-CM

## 2017-09-25 DIAGNOSIS — I110 Hypertensive heart disease with heart failure: Secondary | ICD-10-CM | POA: Diagnosis present

## 2017-09-25 DIAGNOSIS — R079 Chest pain, unspecified: Secondary | ICD-10-CM | POA: Diagnosis not present

## 2017-09-25 DIAGNOSIS — L89159 Pressure ulcer of sacral region, unspecified stage: Secondary | ICD-10-CM | POA: Diagnosis not present

## 2017-09-25 DIAGNOSIS — E876 Hypokalemia: Secondary | ICD-10-CM | POA: Diagnosis not present

## 2017-09-25 DIAGNOSIS — R739 Hyperglycemia, unspecified: Secondary | ICD-10-CM | POA: Diagnosis not present

## 2017-09-25 DIAGNOSIS — F418 Other specified anxiety disorders: Secondary | ICD-10-CM | POA: Diagnosis present

## 2017-09-25 DIAGNOSIS — E119 Type 2 diabetes mellitus without complications: Secondary | ICD-10-CM

## 2017-09-25 DIAGNOSIS — R41841 Cognitive communication deficit: Secondary | ICD-10-CM | POA: Diagnosis not present

## 2017-09-25 DIAGNOSIS — L03319 Cellulitis of trunk, unspecified: Secondary | ICD-10-CM | POA: Diagnosis not present

## 2017-09-25 DIAGNOSIS — M6281 Muscle weakness (generalized): Secondary | ICD-10-CM | POA: Diagnosis not present

## 2017-09-25 DIAGNOSIS — L89324 Pressure ulcer of left buttock, stage 4: Secondary | ICD-10-CM

## 2017-09-25 DIAGNOSIS — S31829A Unspecified open wound of left buttock, initial encounter: Secondary | ICD-10-CM | POA: Diagnosis not present

## 2017-09-25 DIAGNOSIS — R42 Dizziness and giddiness: Secondary | ICD-10-CM | POA: Diagnosis not present

## 2017-09-25 DIAGNOSIS — G4733 Obstructive sleep apnea (adult) (pediatric): Secondary | ICD-10-CM | POA: Diagnosis present

## 2017-09-25 DIAGNOSIS — R2681 Unsteadiness on feet: Secondary | ICD-10-CM | POA: Diagnosis not present

## 2017-09-25 DIAGNOSIS — I10 Essential (primary) hypertension: Secondary | ICD-10-CM | POA: Diagnosis not present

## 2017-09-25 DIAGNOSIS — M25562 Pain in left knee: Secondary | ICD-10-CM | POA: Diagnosis not present

## 2017-09-25 DIAGNOSIS — R55 Syncope and collapse: Secondary | ICD-10-CM | POA: Diagnosis not present

## 2017-09-25 DIAGNOSIS — Z7401 Bed confinement status: Secondary | ICD-10-CM | POA: Diagnosis not present

## 2017-09-25 DIAGNOSIS — E785 Hyperlipidemia, unspecified: Secondary | ICD-10-CM | POA: Diagnosis not present

## 2017-09-25 DIAGNOSIS — T148XXA Other injury of unspecified body region, initial encounter: Secondary | ICD-10-CM

## 2017-09-25 HISTORY — DX: Dependence on other enabling machines and devices: Z99.89

## 2017-09-25 HISTORY — DX: Type 2 diabetes mellitus without complications: E11.9

## 2017-09-25 HISTORY — DX: Obstructive sleep apnea (adult) (pediatric): G47.33

## 2017-09-25 LAB — GLUCOSE, CAPILLARY: GLUCOSE-CAPILLARY: 121 mg/dL — AB (ref 70–99)

## 2017-09-25 LAB — URINALYSIS, ROUTINE W REFLEX MICROSCOPIC
BILIRUBIN URINE: NEGATIVE
GLUCOSE, UA: NEGATIVE mg/dL
HGB URINE DIPSTICK: NEGATIVE
KETONES UR: NEGATIVE mg/dL
NITRITE: NEGATIVE
Protein, ur: NEGATIVE mg/dL
Specific Gravity, Urine: 1.015 (ref 1.005–1.030)
pH: 5 (ref 5.0–8.0)

## 2017-09-25 LAB — CBC WITH DIFFERENTIAL/PLATELET
Abs Immature Granulocytes: 0.1 10*3/uL (ref 0.0–0.1)
Basophils Absolute: 0 10*3/uL (ref 0.0–0.1)
Basophils Relative: 0 %
EOS ABS: 0.1 10*3/uL (ref 0.0–0.7)
Eosinophils Relative: 1 %
HEMATOCRIT: 30.8 % — AB (ref 36.0–46.0)
Hemoglobin: 8.8 g/dL — ABNORMAL LOW (ref 12.0–15.0)
Immature Granulocytes: 1 %
Lymphocytes Relative: 13 %
Lymphs Abs: 1.4 10*3/uL (ref 0.7–4.0)
MCH: 24.6 pg — AB (ref 26.0–34.0)
MCHC: 28.6 g/dL — ABNORMAL LOW (ref 30.0–36.0)
MCV: 86.3 fL (ref 78.0–100.0)
MONO ABS: 0.9 10*3/uL (ref 0.1–1.0)
Monocytes Relative: 8 %
Neutro Abs: 8.2 10*3/uL — ABNORMAL HIGH (ref 1.7–7.7)
Neutrophils Relative %: 77 %
PLATELETS: 212 10*3/uL (ref 150–400)
RBC: 3.57 MIL/uL — ABNORMAL LOW (ref 3.87–5.11)
RDW: 17.3 % — AB (ref 11.5–15.5)
WBC: 10.7 10*3/uL — ABNORMAL HIGH (ref 4.0–10.5)

## 2017-09-25 LAB — BASIC METABOLIC PANEL
Anion gap: 11 (ref 5–15)
BUN: 11 mg/dL (ref 8–23)
CO2: 26 mmol/L (ref 22–32)
Calcium: 8.8 mg/dL — ABNORMAL LOW (ref 8.9–10.3)
Chloride: 99 mmol/L (ref 98–111)
Creatinine, Ser: 0.94 mg/dL (ref 0.44–1.00)
GFR calc Af Amer: >60 mL/min (ref >60)
GFR calc non Af Amer: >60 mL/min (ref >60)
Glucose, Bld: 115 mg/dL — ABNORMAL HIGH (ref 70–99)
Potassium: 3.5 mmol/L (ref 3.5–5.1)
Sodium: 136 mmol/L (ref 135–145)

## 2017-09-25 LAB — MAGNESIUM: Magnesium: 2 mg/dL (ref 1.7–2.4)

## 2017-09-25 LAB — PROTIME-INR
INR: 2.32
Prothrombin Time: 25.3 seconds — ABNORMAL HIGH (ref 11.4–15.2)

## 2017-09-25 MED ORDER — INSULIN DETEMIR 100 UNIT/ML ~~LOC~~ SOLN
40.0000 [IU] | Freq: Every day | SUBCUTANEOUS | Status: DC
  Administered 2017-09-26 – 2017-09-29 (×5): 40 [IU] via SUBCUTANEOUS
  Filled 2017-09-25 (×6): qty 0.4

## 2017-09-25 MED ORDER — TIZANIDINE HCL 4 MG PO TABS
4.0000 mg | ORAL_TABLET | Freq: Three times a day (TID) | ORAL | Status: DC | PRN
  Administered 2017-09-28: 4 mg via ORAL
  Filled 2017-09-25: qty 1

## 2017-09-25 MED ORDER — SODIUM CHLORIDE 0.9 % IV BOLUS
500.0000 mL | Freq: Once | INTRAVENOUS | Status: AC
  Administered 2017-09-25: 500 mL via INTRAVENOUS

## 2017-09-25 MED ORDER — SODIUM CHLORIDE 0.9 % IV SOLN: 250.0000 mL | INTRAVENOUS | Status: DC | PRN

## 2017-09-25 MED ORDER — EZETIMIBE 10 MG PO TABS
10.0000 mg | ORAL_TABLET | Freq: Every day | ORAL | Status: DC
  Administered 2017-09-26 – 2017-09-29 (×5): 10 mg via ORAL
  Filled 2017-09-25 (×6): qty 1

## 2017-09-25 MED ORDER — KETOROLAC TROMETHAMINE 15 MG/ML IJ SOLN
15.0000 mg | Freq: Once | INTRAMUSCULAR | Status: AC
  Administered 2017-09-25: 15 mg via INTRAVENOUS
  Filled 2017-09-25: qty 1

## 2017-09-25 MED ORDER — WARFARIN SODIUM 7.5 MG PO TABS
7.5000 mg | ORAL_TABLET | Freq: Once | ORAL | Status: AC
  Administered 2017-09-26: 7.5 mg via ORAL
  Filled 2017-09-25: qty 1

## 2017-09-25 MED ORDER — MORPHINE SULFATE 15 MG PO TABS: 15.0000 mg | ORAL_TABLET | ORAL | Status: DC | PRN

## 2017-09-25 MED ORDER — LIDOCAINE 5 % EX PTCH: 1.0000 | MEDICATED_PATCH | Freq: Every day | CUTANEOUS | Status: DC | PRN

## 2017-09-25 MED ORDER — ACETAMINOPHEN 325 MG PO TABS
650.0000 mg | ORAL_TABLET | Freq: Four times a day (QID) | ORAL | Status: DC | PRN
  Administered 2017-09-26 – 2017-09-29 (×3): 650 mg via ORAL
  Filled 2017-09-25 (×3): qty 2

## 2017-09-25 MED ORDER — ONDANSETRON HCL 4 MG PO TABS
4.0000 mg | ORAL_TABLET | Freq: Four times a day (QID) | ORAL | Status: DC | PRN
  Administered 2017-09-26: 4 mg via ORAL
  Filled 2017-09-25: qty 1

## 2017-09-25 MED ORDER — INSULIN ASPART 100 UNIT/ML ~~LOC~~ SOLN: 0.0000 [IU] | Freq: Every day | SUBCUTANEOUS | Status: DC

## 2017-09-25 MED ORDER — PRAMIPEXOLE DIHYDROCHLORIDE 0.25 MG PO TABS
0.1250 mg | ORAL_TABLET | Freq: Every day | ORAL | Status: DC
  Administered 2017-09-26 – 2017-09-30 (×5): 0.125 mg via ORAL
  Filled 2017-09-25 (×5): qty 1

## 2017-09-25 MED ORDER — GUAIFENESIN ER 600 MG PO TB12
600.0000 mg | ORAL_TABLET | Freq: Two times a day (BID) | ORAL | Status: DC | PRN
Start: 2017-09-25 — End: 2017-09-30
  Administered 2017-09-26: 600 mg via ORAL
  Filled 2017-09-25: qty 1

## 2017-09-25 MED ORDER — METOPROLOL TARTRATE 25 MG PO TABS
12.5000 mg | ORAL_TABLET | Freq: Once | ORAL | Status: AC
  Administered 2017-09-25: 12.5 mg via ORAL
  Filled 2017-09-25: qty 1

## 2017-09-25 MED ORDER — BUSPIRONE HCL 10 MG PO TABS
10.0000 mg | ORAL_TABLET | Freq: Every day | ORAL | Status: DC
  Administered 2017-09-26 – 2017-09-27 (×2): 10 mg via ORAL
  Filled 2017-09-25 (×2): qty 1

## 2017-09-25 MED ORDER — VITAMIN D 1000 UNITS PO TABS
4000.0000 [IU] | ORAL_TABLET | Freq: Every day | ORAL | Status: DC
  Administered 2017-09-26 – 2017-09-30 (×5): 4000 [IU] via ORAL
  Filled 2017-09-25 (×5): qty 4

## 2017-09-25 MED ORDER — DILTIAZEM LOAD VIA INFUSION
20.0000 mg | Freq: Once | INTRAVENOUS | Status: AC
  Administered 2017-09-25: 20 mg via INTRAVENOUS
  Filled 2017-09-25: qty 20

## 2017-09-25 MED ORDER — INSULIN ASPART 100 UNIT/ML ~~LOC~~ SOLN
0.0000 [IU] | Freq: Three times a day (TID) | SUBCUTANEOUS | Status: DC
  Administered 2017-09-26: 1 [IU] via SUBCUTANEOUS
  Administered 2017-09-26 – 2017-09-27 (×3): 2 [IU] via SUBCUTANEOUS
  Administered 2017-09-27: 3 [IU] via SUBCUTANEOUS
  Administered 2017-09-28: 1 [IU] via SUBCUTANEOUS
  Administered 2017-09-28 (×2): 2 [IU] via SUBCUTANEOUS
  Administered 2017-09-29: 1 [IU] via SUBCUTANEOUS
  Administered 2017-09-29 (×2): 2 [IU] via SUBCUTANEOUS
  Administered 2017-09-30: 1 [IU] via SUBCUTANEOUS

## 2017-09-25 MED ORDER — MAGNESIUM OXIDE 400 (241.3 MG) MG PO TABS
400.0000 mg | ORAL_TABLET | Freq: Three times a day (TID) | ORAL | Status: DC
  Administered 2017-09-26 – 2017-09-30 (×13): 400 mg via ORAL
  Filled 2017-09-25 (×13): qty 1

## 2017-09-25 MED ORDER — DOXYCYCLINE HYCLATE 100 MG PO TABS
100.0000 mg | ORAL_TABLET | Freq: Two times a day (BID) | ORAL | Status: DC
  Administered 2017-09-26 – 2017-09-30 (×10): 100 mg via ORAL
  Filled 2017-09-25 (×10): qty 1

## 2017-09-25 MED ORDER — SODIUM CHLORIDE 0.9% FLUSH
3.0000 mL | Freq: Two times a day (BID) | INTRAVENOUS | Status: DC
  Administered 2017-09-26 – 2017-09-30 (×8): 3 mL via INTRAVENOUS

## 2017-09-25 MED ORDER — CLINDAMYCIN HCL 150 MG PO CAPS
600.0000 mg | ORAL_CAPSULE | Freq: Once | ORAL | Status: AC
  Administered 2017-09-25: 600 mg via ORAL
  Filled 2017-09-25: qty 4

## 2017-09-25 MED ORDER — METOPROLOL TARTRATE 5 MG/5ML IV SOLN
5.0000 mg | Freq: Once | INTRAVENOUS | Status: AC
  Administered 2017-09-25: 5 mg via INTRAVENOUS
  Filled 2017-09-25: qty 5

## 2017-09-25 MED ORDER — SODIUM CHLORIDE 0.9% FLUSH: 3.0000 mL | INTRAVENOUS | Status: DC | PRN

## 2017-09-25 MED ORDER — METOPROLOL SUCCINATE ER 25 MG PO TB24
25.0000 mg | ORAL_TABLET | Freq: Every day | ORAL | Status: DC
  Administered 2017-09-26 – 2017-09-30 (×4): 25 mg via ORAL
  Filled 2017-09-25 (×6): qty 1

## 2017-09-25 MED ORDER — POTASSIUM CHLORIDE CRYS ER 20 MEQ PO TBCR
20.0000 meq | EXTENDED_RELEASE_TABLET | Freq: Every day | ORAL | Status: DC
  Administered 2017-09-26 – 2017-09-30 (×5): 20 meq via ORAL
  Filled 2017-09-25 (×5): qty 1

## 2017-09-25 MED ORDER — ONDANSETRON HCL 4 MG/2ML IJ SOLN: 4.0000 mg | Freq: Four times a day (QID) | INTRAMUSCULAR | Status: DC | PRN

## 2017-09-25 MED ORDER — MORPHINE SULFATE 15 MG PO TABS
15.0000 mg | ORAL_TABLET | Freq: Once | ORAL | Status: AC
  Administered 2017-09-25: 15 mg via ORAL
  Filled 2017-09-25: qty 1

## 2017-09-25 MED ORDER — ALPRAZOLAM 0.25 MG PO TABS
0.2500 mg | ORAL_TABLET | Freq: Three times a day (TID) | ORAL | Status: DC
  Administered 2017-09-26 – 2017-09-30 (×14): 0.25 mg via ORAL
  Filled 2017-09-25 (×14): qty 1

## 2017-09-25 MED ORDER — SODIUM CHLORIDE 0.9% FLUSH
3.0000 mL | Freq: Two times a day (BID) | INTRAVENOUS | Status: DC
  Administered 2017-09-26 – 2017-09-30 (×4): 3 mL via INTRAVENOUS

## 2017-09-25 MED ORDER — SENNOSIDES-DOCUSATE SODIUM 8.6-50 MG PO TABS: 1.0000 | ORAL_TABLET | Freq: Every evening | ORAL | Status: DC | PRN

## 2017-09-25 MED ORDER — OXYCODONE HCL 5 MG PO TABS
5.0000 mg | ORAL_TABLET | Freq: Three times a day (TID) | ORAL | Status: DC | PRN
Start: 2017-09-25 — End: 2017-09-30
  Administered 2017-09-26 – 2017-09-30 (×6): 10 mg via ORAL
  Filled 2017-09-25 (×6): qty 2

## 2017-09-25 MED ORDER — SULFAMETHOXAZOLE-TRIMETHOPRIM 800-160 MG PO TABS: 2.0000 | ORAL_TABLET | Freq: Once | ORAL | Status: DC

## 2017-09-25 MED ORDER — ACETAMINOPHEN 650 MG RE SUPP: 650.0000 mg | Freq: Four times a day (QID) | RECTAL | Status: DC | PRN

## 2017-09-25 MED ORDER — POTASSIUM CHLORIDE CRYS ER 20 MEQ PO TBCR
20.0000 meq | EXTENDED_RELEASE_TABLET | Freq: Once | ORAL | Status: AC
  Administered 2017-09-26: 20 meq via ORAL
  Filled 2017-09-25: qty 1

## 2017-09-25 MED ORDER — WARFARIN - PHARMACIST DOSING INPATIENT
Freq: Every day | Status: DC
  Administered 2017-09-26 – 2017-09-29 (×3)

## 2017-09-25 MED ORDER — GABAPENTIN 600 MG PO TABS
600.0000 mg | ORAL_TABLET | Freq: Three times a day (TID) | ORAL | Status: DC
  Administered 2017-09-26 – 2017-09-30 (×14): 600 mg via ORAL
  Filled 2017-09-25 (×14): qty 1

## 2017-09-25 MED ORDER — SERTRALINE HCL 100 MG PO TABS
100.0000 mg | ORAL_TABLET | Freq: Every day | ORAL | Status: DC
  Administered 2017-09-26 – 2017-09-30 (×5): 100 mg via ORAL
  Filled 2017-09-25 (×5): qty 1

## 2017-09-25 MED ORDER — DILTIAZEM HCL-DEXTROSE 100-5 MG/100ML-% IV SOLN (PREMIX)
5.0000 mg/h | INTRAVENOUS | Status: DC
  Administered 2017-09-25: 5 mg/h via INTRAVENOUS
  Administered 2017-09-26 – 2017-09-27 (×3): 7.5 mg/h via INTRAVENOUS
  Filled 2017-09-25 (×4): qty 100

## 2017-09-25 MED ORDER — VENLAFAXINE HCL ER 75 MG PO CP24
150.0000 mg | ORAL_CAPSULE | Freq: Every day | ORAL | Status: DC
  Administered 2017-09-26 – 2017-09-30 (×5): 150 mg via ORAL
  Filled 2017-09-25 (×5): qty 2

## 2017-09-25 NOTE — ED Triage Notes (Signed)
Patient arrived to ED via [facility] from [place]. EMS reports:  Patient experienced fall 2 weeks ago. Has infected open wound on back as a result. Pain 10/10. Patient had CT yesterday d/t slurred speech. Wears CPAP at night.  Hx A fib. On Coumadin. Febrile 101. BP 160/100, Pulse 100, Resp 16. CBG 175.

## 2017-09-25 NOTE — ED Notes (Signed)
RN attempted to give report RN unavailable

## 2017-09-25 NOTE — ED Provider Notes (Signed)
[place] EMERGENCY DEPARTMENT Provider Note   CSN: [id number] Arrival date & time: 09/25/17  1644  History   Chief Complaint Chief Complaint  Patient presents with  . Wound Check   HPI  Patient is a 67 year old female with history of obesity, chronic atrial fibrillation on Coumadin, fibromyalgia, and possible diastolic heart failure presenting to the ED for painful wound.  Per patient, she has had a painful spot on her bottom for about 4 weeks which abruptly worsened 4 days ago when it opened.  She states pain has been poorly controlled since that time.  She states she spends her time primarily in bed but does get out of bed to walk or sit in a wheelchair.  She states she did have a fever this evening. She denies any chest pain, dyspnea, vomiting, or diarrhea. No cough.  She is wearing 2 L nasal cannula the time of my evaluation which she says is new.  When asked why she is wearing the oxygen, she states she does not know.  I called her nursing facility [facility] and spoke with RN who reported temperature to 100.7 today as well as recent and her metoprolol dose from 50 mg to 25 mg daily.  They state that she had what appeared to be a pimple that popped over her sacrum about 4 days ago and has had mild drainage since that time.  Past Medical History:  Diagnosis Date  . CHF (congestive heart failure) ([place])   . Chronic /permanent atrial fibrillation ([place]) 2005   On Warfarin (follwed @ [facility]); Rate controlled - On BB  . Chronic diastolic heart failure, NYHA class 2 ([place])    Updated by A. fib, hypertensive heart disease and obesity; EDP was only 7 by cardiac catheterization in September 2017  . Diabetes mellitus without complication ([place])   . Diabetic lumbosacral plexopathy ([place])   . Fibromyalgia   . Generalized anxiety disorder   . Hypokalemia 09/03/2017  . Incontinence   . Major depressive disorder   . Restless leg syndrome   . Syncope  09/03/2017    Patient Active Problem List   Diagnosis Date Noted  . Atrial fibrillation with RVR ([place]) 09/25/2017  . Cellulitis of sacral region 09/25/2017  . Normocytic anemia 09/25/2017  . Insulin-requiring or dependent type II diabetes mellitus ([place]) 09/25/2017  . Chronic pain 09/25/2017  . History of seizures 09/03/2017  . Depression 02/09/2017  . Anxiety 02/09/2017  . Venous stasis of both lower extremities 07/25/2016  . Essential hypertension 07/25/2016  . Anticoagulant long-term use 07/15/2016  . Anatomical narrow angle borderline glaucoma of both eyes 04/02/2016  . Chronic diastolic heart failure ([place]) 02/08/2016  . Chronic atrial fibrillation ([place]): CHA2DS2-VASc Score 5; on Warfarin 02/08/2016  . Morbid obesity ([place]) 02/08/2016  . OSA (obstructive sleep apnea) 02/08/2016  . Pre-operative cardiovascular examination 02/08/2016  . Medication management 02/08/2016  . Fibromyalgia 08/07/2012  . Restless legs syndrome 08/07/2012    Past Surgical History:  Procedure Laterality Date  . ABDOMINAL HYSTERECTOMY    . CARDIAC CATHETERIZATION  11/09/2015   [facility]: Nonocclusive CAD. EF 55%. EDP 7 mmHg.  [facility] CHOLECYSTECTOMY    . HIP SURGERY Right    x2  . KNEE ARTHROSCOPY Right   . MASS EXCISION     sarcoma - shoulder  . ROTATOR CUFF REPAIR Bilateral   . TONSILLECTOMY    . TRANSTHORACIC ECHOCARDIOGRAM  11/2013; 02/2016   a) [facility]: with Definity:  EF  60-65%. Mod LA dilation. Mild AoV sclerosis. Normal RHP. Indeterminate LV filling pressures. b) [facility]: Normal cavity size. EF 55-60%.no RWMA, mild AoV sclerosis (R cusp), Mod MAC. Mild biAtrial dilation.     OB History   None      Home Medications    Prior to Admission medications   Medication Sig Start Date End Date Taking? Authorizing Provider  acetaminophen (TYLENOL) 500 MG tablet Take 500 mg by mouth every 6 (six) hours as needed for mild pain.    Yes [provider]  ALPRAZolam (XANAX) 0.25  MG tablet Take 1 tablet (0.25 mg total) by mouth 3 (three) times daily. 09/07/17  Yes Sheikh, Omair Latif, DO  busPIRone (BUSPAR) 10 MG tablet Take 10 mg by mouth daily.    Yes [provider]  Cholecalciferol (VITAMIN D) 2000 units tablet Take 4,000 Units by mouth daily.    Yes [provider]  Dulaglutide (TRULICITY) 1.5 PX/1.0GY SOPN Inject 1.5 mg into the skin every Tuesday.   Yes [provider]  ezetimibe (ZETIA) 10 MG tablet Take 10 mg by mouth at bedtime.    Yes [provider]  ferrous sulfate 325 (65 FE) MG tablet Take 325 mg by mouth See admin instructions. Take one tablet (325 mg) by mouth on Sunday, Monday, Wednesday and Friday mornings.   Yes [provider]  gabapentin (NEURONTIN) 600 MG tablet Take 600 mg by mouth 3 (three) times daily.   Yes [provider]  guaiFENesin (MUCINEX) 600 MG 12 hr tablet Take 600 mg by mouth 2 (two) times daily as needed for cough or to loosen phlegm.    Yes [provider]  Insulin Detemir (LEVEMIR FLEXTOUCH) 100 UNIT/ML Pen Inject 66 Units into the skin at bedtime.   Yes [provider]  insulin lispro (HUMALOG KWIKPEN) 100 UNIT/ML KiwkPen Inject 6-14 Units into the skin See admin instructions. Inject 6-14 units subcutaneously before each meal per sliding scale: CBG 201-250 6 units, 251-300 8 units, 301-350 10 units, 351-400 12 units, over 400 14 units and call MD   Yes [provider]  lidocaine (LIDODERM) 5 % Place 1 patch onto the skin daily as needed (right hip pain). Remove & Discard patch within 12 hours or as directed by MD    Yes [provider]  magnesium oxide (MAG-OX) 400 MG tablet Take 400 mg by mouth 3 (three) times daily.    Yes [provider]  Melatonin 5 MG CAPS Take 5 mg by mouth at bedtime.   Yes [provider]  metoprolol succinate (TOPROL-XL) 25 MG 24 hr tablet Take 1 tablet (25 mg total) by mouth daily. Take with or immediately  following a meal. 09/23/17  Yes Meng, [place], PA  miconazole (MICOTIN) 2 % powder Apply 1 application topically See admin instructions. Apply topically to abdominal folds twice daily   Yes [provider]  OxyCODONE HCl, Abuse Deter, (OXAYDO) 5 MG TABA Take 1 tablet by mouth 3 (three) times daily as needed (pain). Patient taking differently: Take 1 tablet by mouth 3 (three) times daily as needed (moderate pain).  09/07/17  Yes Sheikh, Omair Latif, DO  OXYGEN Inhale 2 L into the lungs as needed (to keep SATS greater than 90%).   Yes [provider]  pramipexole (MIRAPEX) 0.125 MG tablet Take 0.125 mg by mouth daily.   Yes [provider]  PRESCRIPTION MEDICATION Inhale into the lungs at bedtime. CPAP   Yes [provider]  sertraline (ZOLOFT)  100 MG tablet Take 100 mg by mouth daily.   Yes [provider]  tiZANidine (ZANAFLEX) 4 MG tablet Take 4 mg by mouth 3 (three) times daily as needed for muscle spasms.   Yes [provider]  Turmeric 500 MG CAPS Take 500 mg by mouth daily.    Yes [provider]  venlafaxine XR (EFFEXOR-XR) 150 MG 24 hr capsule Take 150 mg by mouth daily with breakfast.   Yes [provider]  warfarin (COUMADIN) 10 MG tablet Take 10 mg by mouth See admin instructions. Take one tablet (10 mg) by mouth on Tuesday nights (take 7.5 mg tablet on all other nights of the week)   Yes [provider]  warfarin (COUMADIN) 7.5 MG tablet Take 7.5 mg by mouth See admin instructions. Take one tablet (7.5 mg) by mouth at bedtime except on Tuesdays (take 10 mg tablet on Tuesdays)   Yes [provider]  furosemide (LASIX) 40 MG tablet Take 40 mg by mouth daily.    [provider]  furosemide (LASIX) 80 MG tablet Take 1 tablet (80 mg total) by mouth daily. Patient not taking: Reported on 09/25/2017 09/09/17   Almyra Deforest, PA  metolazone (ZAROXOLYN) 10 MG tablet Take 1 tablet (10 mg total) by mouth as needed  (for swelling). ON HOLD PER CARDIOLOGY Patient not taking: Reported on 09/25/2017 09/09/17   Almyra Deforest, PA  potassium chloride SA (K-DUR,KLOR-CON) 20 MEQ tablet Take 2 tablets (40 mEq total) by mouth daily. Patient taking differently: Take 20 mEq by mouth daily.  09/09/17   Almyra Deforest, PA    Family History Family History  Problem Relation Age of Onset  . Hypertension Mother   . Heart disease Mother   . Leukemia Son     Social History Social History   Tobacco Use  . Smoking status: Never Smoker  . Smokeless tobacco: Never Used  Substance Use Topics  . Alcohol use: No  . Drug use: No     Allergies   Bee venom; Fentanyl; Metformin and related; Other; Alteplase; and Cefepime   Review of Systems Review of Systems  Constitutional: Positive for fatigue and fever.  HENT: Negative for congestion and sore throat.   Eyes: Negative for visual disturbance.  Respiratory: Negative for shortness of breath.   Cardiovascular: Negative for chest pain.  Gastrointestinal: Negative for abdominal pain, diarrhea and vomiting.  Genitourinary: Negative for dysuria.  Musculoskeletal: Negative for neck pain.  All other systems reviewed and are negative.    Physical Exam Updated Vital Signs BP 116/73   Pulse (!) 105   Temp 99.6 F (37.6 C) (Oral)   Resp (!) 24   Ht 5' 10"  (1.778 m)   Wt 127 kg (280 lb)   SpO2 96%   BMI 40.18 kg/m   Physical Exam  Constitutional: She is oriented to person, place, and time. No distress.  HENT:  Head: Normocephalic and atraumatic.  Mouth/Throat: Oropharynx is clear and moist.  Eyes: Pupils are equal, round, and reactive to light. Conjunctivae are normal.  Neck: Neck supple. No tracheal deviation present.  Cardiovascular: Normal rate, regular rhythm, normal heart sounds and intact distal pulses.  No murmur heard. Pulmonary/Chest: Effort normal and breath sounds normal. No stridor. No respiratory distress. She has no wheezes. She has no rales.    Abdominal: Soft. She exhibits no distension and no mass. There is no tenderness. There is no guarding.  Obese  Musculoskeletal: She exhibits edema (trace, symmetric, pitting). She exhibits  no deformity.  Neurological: She is alert and oriented to person, place, and time.  Skin: Skin is warm and dry.  There is a large area of erythematous skin over the sacrum consistent with stage I pressure ulcer.  In the center of this, there is a 3 to 4 cm area of skin breakdown with fibrinous base suspicious for stage II ulcer.  No fluctuance, induration, or active drainage.  Psychiatric: She has a normal mood and affect. Her behavior is normal.  Nursing note and vitals reviewed.    ED Treatments / Results  Labs (all labs ordered are listed, but only abnormal results are displayed) Labs Reviewed  URINALYSIS, ROUTINE W REFLEX MICROSCOPIC - Abnormal; Notable for the following components:      Result Value   Leukocytes, UA TRACE (*)    Bacteria, UA RARE (*)    All other components within normal limits  CBC WITH DIFFERENTIAL/PLATELET - Abnormal; Notable for the following components:   WBC 10.7 (*)    RBC 3.57 (*)    Hemoglobin 8.8 (*)    HCT 30.8 (*)    MCH 24.6 (*)    MCHC 28.6 (*)    RDW 17.3 (*)    Neutro Abs 8.2 (*)    All other components within normal limits  BASIC METABOLIC PANEL - Abnormal; Notable for the following components:   Glucose, Bld 115 (*)    Calcium 8.8 (*)    All other components within normal limits  PROTIME-INR - Abnormal; Notable for the following components:   Prothrombin Time 25.3 (*)    All other components within normal limits  CULTURE, BLOOD (ROUTINE X 2)  CULTURE, BLOOD (ROUTINE X 2)  MAGNESIUM  BASIC METABOLIC PANEL  CBC WITH DIFFERENTIAL/PLATELET    EKG EKG Interpretation  Date/Time:  Friday September 25 2017 16:49:46 EDT Ventricular Rate:  123 PR Interval:    QRS Duration: 85 QT Interval:  341 QTC Calculation: 494 R Axis:   -38 Text Interpretation:   Atrial fibrillation Nonspecific T wave abnormality Confirmed by Lajean Saver [phone]) on 09/25/2017 5:28:44 PM   Radiology Dg Chest 2 View  Result Date: 09/25/2017 CLINICAL DATA:  Chest pain and fever. EXAM: CHEST - 2 VIEW COMPARISON:  September 03, 2017 FINDINGS: The heart size is borderline to mildly enlarged. The hila and mediastinum are normal. No pneumothorax. No nodules or masses. Haziness over the left chest is likely due to patient rotation. No focal infiltrate or overt edema. IMPRESSION: Limited study with no acute abnormality noted. Electronically Signed   By: Dorise Bullion III M.D   On: 09/25/2017 19:23   Ct Head Wo Contrast  Result Date: 09/24/2017 CLINICAL DATA:  Headache and dizziness with syncope EXAM: CT HEAD WITHOUT CONTRAST TECHNIQUE: Contiguous axial images were obtained from the base of the skull through the vertex without intravenous contrast. COMPARISON:  September 03, 2017 FINDINGS: Brain: There is mild diffuse atrophy, stable. There is no intracranial mass, hemorrhage, extra-axial fluid collection, or midline shift. There is minimal small vessel disease in the centra semiovale bilaterally. Elsewhere gray-white compartments appear normal. No evident acute infarct. Vascular: No evident hyperdense vessel. There is mild calcification carotid siphon regions. Skull: The bony calvarium appears intact. Sinuses/Orbits: There is mucosal thickening in several ethmoid air cells. Other paranasal sinuses are clear. Orbits appear symmetric bilaterally. Other: Mastoid air cells are clear. IMPRESSION: Mild atrophy with minimal periventricular small vessel disease. No acute infarct. No mass or hemorrhage. There is mild arterial vascular calcification. There is  mucosal thickening in several ethmoid air cells. Electronically Signed   By: Lowella Grip III M.D.   On: 09/24/2017 14:12    Procedures Procedures (including critical care time)  Medications Ordered in ED Medications  morphine (MSIR) tablet  15 mg (has no administration in time range)  diltiazem (CARDIZEM) 1 mg/mL load via infusion 20 mg (20 mg Intravenous Bolus from Bag 09/25/17 2156)    And  diltiazem (CARDIZEM) 100 mg in dextrose 5% 19m (1 mg/mL) infusion (7.5 mg/hr Intravenous Rate/Dose Change 09/25/17 2234)  metoprolol tartrate (LOPRESSOR) injection 5 mg (5 mg Intravenous Given 09/25/17 1832)  sodium chloride 0.9 % bolus 500 mL (0 mLs Intravenous Stopped 09/25/17 2059)  morphine (MSIR) tablet 15 mg (15 mg Oral Given 09/25/17 1831)  metoprolol tartrate (LOPRESSOR) tablet 12.5 mg (12.5 mg Oral Given 09/25/17 2056)  metoprolol tartrate (LOPRESSOR) injection 5 mg (5 mg Intravenous Given 09/25/17 2057)  ketorolac (TORADOL) 15 MG/ML injection 15 mg (15 mg Intravenous Given 09/25/17 2152)  clindamycin (CLEOCIN) capsule 600 mg (600 mg Oral Given 09/25/17 2149)     Initial Impression / Assessment and Plan / ED Course  I have reviewed the triage vital signs and the nursing notes.  Pertinent labs & imaging results that were available during my care of the patient were reviewed by me and considered in my medical decision making (see chart for details).  This is a 67year old female with history of obesity, chronic atrial fibrillation on warfarin, and fibromyalgia presenting to the ED for fever and painful sacral wound.  Regarding her wound, clinical picture consistent with cellulitis.  No signs of abscess or necrotizing infection.   Patient started on clindamycin and blood cultures were sent.  Screening infectious evaluation otherwise reassuring.    She was also persistently tachycardic while in the ED and EKG showed atrial fibrillation with RVR similar to prior.  She had recently had her metoprolol dose decreased and we therefore attempted 2 doses of IV metoprolol in addition to p.o. metoprolol with inadequate rate control.  We therefore started diltiazem drip and admitted her to the hospitalist for further management.  Final Clinical  Impressions(s) / ED Diagnoses   Final diagnoses:  Wound infection  Chronic atrial fibrillation with RVR (HCC)     MPrescilla Sours MD 09/25/17 2321    SLajean Saver MD 09/25/17 2322

## 2017-09-25 NOTE — Progress Notes (Signed)
ANTICOAGULATION CONSULT NOTE - Initial Consult  Pharmacy Consult for coumadin Indication: atrial fibrillation  Allergies  Allergen Reactions  . Bee Venom Hives    Takes benadryl  . Fentanyl Other (See Comments)    Hallucinations, syncope    . Metformin And Related Diarrhea  . Other Other (See Comments)    Raw foods with seeds give her "boils".  Avoids raw strawberries, blueberries. Tolerates cooked fruits, tomato sauce, bread/grains with seeds. [facility] 10/17/13 Berries with seeds Allergy to glutamine-C-quercet-selen-brom per [facility] 09/25/17  . Alteplase Rash  . Cefepime Rash    Patient Measurements: Height: 5\' 10"  (177.8 cm) Weight: 280 lb (127 kg) IBW/kg (Calculated) : 68.5   Vital Signs: Temp: 99.6 F (37.6 C) (07/26 1724) Temp Source: Oral (07/26 1724) BP: 116/73 (07/26 2230) Pulse Rate: 105 (07/26 2230)  Labs: Recent Labs    09/23/17 1237 09/25/17 1828  HGB 8.8* 8.8*  HCT 28.4* 30.8*  PLT 226 212  LABPROT  --  25.3*  INR  --  2.32  CREATININE 0.96 0.94    Estimated Creatinine Clearance: 85.4 mL/min (by C-G formula based on SCr of 0.94 mg/dL).   Medical History: Past Medical History:  Diagnosis Date  . CHF (congestive heart failure) ([place])   . Chronic /permanent atrial fibrillation ([place]) 2005   On Warfarin (follwed @ [facility]); Rate controlled - On BB  . Chronic diastolic heart failure, NYHA class 2 ([place])    Updated by A. fib, hypertensive heart disease and obesity; EDP was only 7 by cardiac catheterization in September 2017  . Diabetes mellitus without complication ([place])   . Diabetic lumbosacral plexopathy ([place])   . Fibromyalgia   . Generalized anxiety disorder   . Hypokalemia 09/03/2017  . Incontinence   . Major depressive disorder   . Restless leg syndrome   . Syncope 09/03/2017    Medications:  Medications Prior to Admission  Medication Sig Dispense Refill Last Dose  . acetaminophen (TYLENOL) 500 MG tablet Take 500 mg by  mouth every 6 (six) hours as needed for mild pain.    09/25/2017 at 511  . ALPRAZolam (XANAX) 0.25 MG tablet Take 1 tablet (0.25 mg total) by mouth 3 (three) times daily. 10 tablet 0 09/25/2017 at 1300  . busPIRone (BUSPAR) 10 MG tablet Take 10 mg by mouth daily.    09/25/2017 at 900  . Cholecalciferol (VITAMIN D) 2000 units tablet Take 4,000 Units by mouth daily.    09/25/2017 at 900  . Dulaglutide (TRULICITY) 1.5 VP/7.1GG SOPN Inject 1.5 mg into the skin every Tuesday.   09/22/2017 at 900  . ezetimibe (ZETIA) 10 MG tablet Take 10 mg by mouth at bedtime.    09/24/2017 at 2100  . ferrous sulfate 325 (65 FE) MG tablet Take 325 mg by mouth See admin instructions. Take one tablet (325 mg) by mouth on Sunday, Monday, Wednesday and Friday mornings.   09/25/2017 at 900  . gabapentin (NEURONTIN) 600 MG tablet Take 600 mg by mouth 3 (three) times daily.   09/25/2017 at 1300  . guaiFENesin (MUCINEX) 600 MG 12 hr tablet Take 600 mg by mouth 2 (two) times daily as needed for cough or to loosen phlegm.    09/25/2017 at 1230  . Insulin Detemir (LEVEMIR FLEXTOUCH) 100 UNIT/ML Pen Inject 66 Units into the skin at bedtime.   09/24/2017 at 2100  . insulin lispro (HUMALOG KWIKPEN) 100 UNIT/ML KiwkPen Inject 6-14 Units into the skin See admin instructions. Inject 6-14 units subcutaneously before  each meal per sliding scale: CBG 201-250 6 units, 251-300 8 units, 301-350 10 units, 351-400 12 units, over 400 14 units and call MD   09/24/2017 at 630  . lidocaine (LIDODERM) 5 % Place 1 patch onto the skin daily as needed (right hip pain). Remove & Discard patch within 12 hours or as directed by MD    unknown  . magnesium oxide (MAG-OX) 400 MG tablet Take 400 mg by mouth 3 (three) times daily.    09/25/2017 at 1300  . Melatonin 5 MG CAPS Take 5 mg by mouth at bedtime.   09/24/2017 at 2100  . metoprolol succinate (TOPROL-XL) 25 MG 24 hr tablet Take 1 tablet (25 mg total) by mouth daily. Take with or immediately following a meal. 30 tablet 5  09/25/2017 at 900  . miconazole (MICOTIN) 2 % powder Apply 1 application topically See admin instructions. Apply topically to abdominal folds twice daily   09/25/2017 at 900  . OxyCODONE HCl, Abuse Deter, (OXAYDO) 5 MG TABA Take 1 tablet by mouth 3 (three) times daily as needed (pain). (Patient taking differently: Take 1 tablet by mouth 3 (three) times daily as needed (moderate pain). ) 5 tablet 0 09/25/2017 at 1230  . OXYGEN Inhale 2 L into the lungs as needed (to keep SATS greater than 90%).   unknown  . pramipexole (MIRAPEX) 0.125 MG tablet Take 0.125 mg by mouth daily.   09/25/2017 at 900  . PRESCRIPTION MEDICATION Inhale into the lungs at bedtime. CPAP   09/24/2017 at 2100  . sertraline (ZOLOFT) 100 MG tablet Take 100 mg by mouth daily.   09/25/2017 at 900  . tiZANidine (ZANAFLEX) 4 MG tablet Take 4 mg by mouth 3 (three) times daily as needed for muscle spasms.   09/24/2017 at 2143  . Turmeric 500 MG CAPS Take 500 mg by mouth daily.    09/25/2017 at 900  . venlafaxine XR (EFFEXOR-XR) 150 MG 24 hr capsule Take 150 mg by mouth daily with breakfast.   09/25/2017 at 900  . warfarin (COUMADIN) 10 MG tablet Take 10 mg by mouth See admin instructions. Take one tablet (10 mg) by mouth on Tuesday nights (take 7.5 mg tablet on all other nights of the week)   09/22/2017 at 2100  . warfarin (COUMADIN) 7.5 MG tablet Take 7.5 mg by mouth See admin instructions. Take one tablet (7.5 mg) by mouth at bedtime except on Tuesdays (take 10 mg tablet on Tuesdays)   09/24/2017 at 2100  . furosemide (LASIX) 40 MG tablet Take 40 mg by mouth daily.   not yet given  . furosemide (LASIX) 80 MG tablet Take 1 tablet (80 mg total) by mouth daily. (Patient not taking: Reported on 09/25/2017) 30 tablet 2 Not Taking at Unknown time  . metolazone (ZAROXOLYN) 10 MG tablet Take 1 tablet (10 mg total) by mouth as needed (for swelling). ON HOLD PER CARDIOLOGY (Patient not taking: Reported on 09/25/2017) 90 tablet 3 Not Taking at Unknown time  .  potassium chloride SA (K-DUR,KLOR-CON) 20 MEQ tablet Take 2 tablets (40 mEq total) by mouth daily. (Patient taking differently: Take 20 mEq by mouth daily. ) 60 tablet 3 09/23/2017    Assessment: 67 yo lady to continue coumadin for afib.  Admission INR therapeutic Goal of Therapy:  INR 2-3 Monitor platelets by anticoagulation protocol: Yes   Plan:  Coumadin 7.5 mg po tonight Daily PT/INR  Samantha Olivera Poteet 09/25/2017,11:32 PM

## 2017-09-25 NOTE — H&P (Signed)
History and Physical    Donna [name] PQD:[id number] DOB: [birth year] DOA: 09/25/2017  PCP: Janine Limbo, PA-C   Patient coming from: SNF  Chief Complaint: Low back pain with purulent drainage from small sacral wound   HPI: Donna [name] is a 67 y.o. female with medical history significant for obesity (BMI 40), chronic pain, depression with anxiety, atrial fibrillation on warfarin, and insulin-dependent diabetes mellitus, now presenting to the emergency department for evaluation of lower back pain where there is a small draining wound.  Patient reports developing a small "pimple" on her sacrum that began to drain purulent material 4 days ago.  She has had low-grade fever and increasing pain at the site.  She denies chest pain, palpitations, headache, change in vision or hearing, or focal numbness or weakness.  She denies any other sores or rash on admission.  She was recently admitted with profuse watery diarrhea and volume depletion, was fluid resuscitated, and had her diuretics held.  ED Course: Upon arrival to the ED, patient is found to have a temp of 37.8 C, saturating well on 2 L/min supplemental oxygen, tachycardic with rates in the 120s to 130s, and with low-normal blood pressures.  EKG features atrial fibrillation with rate 123.  Chest x-ray is limited by patient rotation but negative for acute cardiopulmonary disease.  Chemistry panel is unremarkable.  CBC is notable for mild leukocytosis and a normocytic anemia with hemoglobin 8.8.  INR is 2.32 and urinalysis unremarkable.  Blood cultures were collected, clindamycin administered, and the patient was also treated with 500 cc normal saline, oral morphine x2, multiple doses of Lopressor without significant improvement in heart rate, and then started on diltiazem infusion.  She remains tachycardic with stable blood pressure and will be admitted for ongoing evaluation and management.  Review of Systems:  All other systems reviewed and apart from  HPI, are negative.  Past Medical History:  Diagnosis Date  . CHF (congestive heart failure) ([place])   . Chronic /permanent atrial fibrillation ([place]) 2005   On Warfarin (follwed @ [facility]); Rate controlled - On BB  . Chronic diastolic heart failure, NYHA class 2 ([place])    Updated by A. fib, hypertensive heart disease and obesity; EDP was only 7 by cardiac catheterization in September 2017  . Diabetes mellitus without complication ([place])   . Diabetic lumbosacral plexopathy ([place])   . Fibromyalgia   . Generalized anxiety disorder   . Hypokalemia 09/03/2017  . Incontinence   . Major depressive disorder   . Restless leg syndrome   . Syncope 09/03/2017    Past Surgical History:  Procedure Laterality Date  . ABDOMINAL HYSTERECTOMY    . CARDIAC CATHETERIZATION  11/09/2015   [facility]: Nonocclusive CAD. EF 55%. EDP 7 mmHg.  [facility] CHOLECYSTECTOMY    . HIP SURGERY Right    x2  . KNEE ARTHROSCOPY Right   . MASS EXCISION     sarcoma - shoulder  . ROTATOR CUFF REPAIR Bilateral   . TONSILLECTOMY    . TRANSTHORACIC ECHOCARDIOGRAM  11/2013; 02/2016   a) [facility]: with Definity:  EF 60-65%. Mod LA dilation. Mild AoV sclerosis. Normal RHP. Indeterminate LV filling pressures. b) [facility]: Normal cavity size. EF 55-60%.no RWMA, mild AoV sclerosis (R cusp), Mod MAC. Mild biAtrial dilation.     reports that she has never smoked. She has never used smokeless tobacco. She reports that she does not drink alcohol or use drugs.  Allergies  Allergen Reactions  .  Bee Venom Hives    Takes benadryl  . Fentanyl Other (See Comments)    Hallucinations, syncope    . Metformin And Related Diarrhea  . Other Other (See Comments)    Raw foods with seeds give her "boils".  Avoids raw strawberries, blueberries. Tolerates cooked fruits, tomato sauce, bread/grains with seeds. [facility] 10/17/13 Berries with seeds Allergy to glutamine-C-quercet-selen-brom per [facility] 09/25/17  . Alteplase Rash   . Cefepime Rash    Family History  Problem Relation Age of Onset  . Hypertension Mother   . Heart disease Mother   . Leukemia Son      Prior to Admission medications   Medication Sig Start Date End Date Taking? Authorizing Provider  acetaminophen (TYLENOL) 500 MG tablet Take 500 mg by mouth every 6 (six) hours as needed for mild pain.    Yes [provider]  ALPRAZolam (XANAX) 0.25 MG tablet Take 1 tablet (0.25 mg total) by mouth 3 (three) times daily. 09/07/17  Yes Sheikh, Omair Latif, DO  busPIRone (BUSPAR) 10 MG tablet Take 10 mg by mouth daily.    Yes [provider]  Cholecalciferol (VITAMIN D) 2000 units tablet Take 4,000 Units by mouth daily.    Yes [provider]  Dulaglutide (TRULICITY) 1.5 BB/0.4UG SOPN Inject 1.5 mg into the skin every Tuesday.   Yes [provider]  ezetimibe (ZETIA) 10 MG tablet Take 10 mg by mouth at bedtime.    Yes [provider]  ferrous sulfate 325 (65 FE) MG tablet Take 325 mg by mouth See admin instructions. Take one tablet (325 mg) by mouth on Sunday, Monday, Wednesday and Friday mornings.   Yes [provider]  gabapentin (NEURONTIN) 600 MG tablet Take 600 mg by mouth 3 (three) times daily.   Yes [provider]  guaiFENesin (MUCINEX) 600 MG 12 hr tablet Take 600 mg by mouth 2 (two) times daily as needed for cough or to loosen phlegm.    Yes [provider]  Insulin Detemir (LEVEMIR FLEXTOUCH) 100 UNIT/ML Pen Inject 66 Units into the skin at bedtime.   Yes [provider]  insulin lispro (HUMALOG KWIKPEN) 100 UNIT/ML KiwkPen Inject 6-14 Units into the skin See admin instructions. Inject 6-14 units subcutaneously before each meal per sliding scale: CBG 201-250 6 units, 251-300 8 units, 301-350 10 units, 351-400 12 units, over 400 14 units and call MD   Yes [provider]  lidocaine (LIDODERM) 5 % Place 1 patch onto the skin daily as needed (right hip pain). Remove  & Discard patch within 12 hours or as directed by MD    Yes [provider]  magnesium oxide (MAG-OX) 400 MG tablet Take 400 mg by mouth 3 (three) times daily.    Yes [provider]  Melatonin 5 MG CAPS Take 5 mg by mouth at bedtime.   Yes [provider]  metoprolol succinate (TOPROL-XL) 25 MG 24 hr tablet Take 1 tablet (25 mg total) by mouth daily. Take with or immediately following a meal. 09/23/17  Yes Meng, [place], PA  miconazole (MICOTIN) 2 % powder Apply 1 application topically See admin instructions. Apply topically to abdominal folds twice daily   Yes [provider]  OxyCODONE HCl, Abuse Deter, (OXAYDO) 5 MG TABA Take 1 tablet by mouth 3 (three) times daily as needed (pain). Patient taking differently: Take 1 tablet by mouth 3 (three) times daily as needed (moderate pain).  09/07/17  Yes Sheikh, Omair Latif, DO  OXYGEN Inhale  2 L into the lungs as needed (to keep SATS greater than 90%).   Yes [provider]  pramipexole (MIRAPEX) 0.125 MG tablet Take 0.125 mg by mouth daily.   Yes [provider]  PRESCRIPTION MEDICATION Inhale into the lungs at bedtime. CPAP   Yes [provider]  sertraline (ZOLOFT) 100 MG tablet Take 100 mg by mouth daily.   Yes [provider]  tiZANidine (ZANAFLEX) 4 MG tablet Take 4 mg by mouth 3 (three) times daily as needed for muscle spasms.   Yes [provider]  Turmeric 500 MG CAPS Take 500 mg by mouth daily.    Yes [provider]  venlafaxine XR (EFFEXOR-XR) 150 MG 24 hr capsule Take 150 mg by mouth daily with breakfast.   Yes [provider]  warfarin (COUMADIN) 10 MG tablet Take 10 mg by mouth See admin instructions. Take one tablet (10 mg) by mouth on Tuesday nights (take 7.5 mg tablet on all other nights of the week)   Yes [provider]  warfarin (COUMADIN) 7.5 MG tablet Take 7.5 mg by mouth See admin instructions. Take one tablet (7.5 mg) by mouth at  bedtime except on Tuesdays (take 10 mg tablet on Tuesdays)   Yes [provider]  furosemide (LASIX) 40 MG tablet Take 40 mg by mouth daily.    [provider]  furosemide (LASIX) 80 MG tablet Take 1 tablet (80 mg total) by mouth daily. Patient not taking: Reported on 09/25/2017 09/09/17   Almyra Deforest, PA  metolazone (ZAROXOLYN) 10 MG tablet Take 1 tablet (10 mg total) by mouth as needed (for swelling). ON HOLD PER CARDIOLOGY Patient not taking: Reported on 09/25/2017 09/09/17   Almyra Deforest, PA  potassium chloride SA (K-DUR,KLOR-CON) 20 MEQ tablet Take 2 tablets (40 mEq total) by mouth daily. Patient taking differently: Take 20 mEq by mouth daily.  09/09/17   Almyra Deforest, PA    Physical Exam: Vitals:   09/25/17 2200 09/25/17 2218 09/25/17 2219 09/25/17 2230  BP: (!) 96/35 108/70  116/73  Pulse: 100 86 93 (!) 105  Resp: 18 (!) 23 14 (!) 24  Temp:      TempSrc:      SpO2: 96% 96% 96% 96%  Weight:      Height:          Constitutional: NAD, calm, obese  Eyes: PERTLA, lids and conjunctivae normal ENMT: Mucous membranes are moist. Posterior pharynx clear of any exudate or lesions.   Neck: normal, supple, no masses, no thyromegaly Respiratory: clear to auscultation bilaterally, no wheezing, no crackles. Normal respiratory effort.   Cardiovascular: Rate ~120 and irregular. 2+ pretibial edema bilaterally. Abdomen: No distension, no tenderness, soft. Bowel sounds normal.  Musculoskeletal: no clubbing / cyanosis. No joint deformity upper and lower extremities.    Skin: Small sacral wound with scant drainage and surrounding erythema and heat. Warm, dry, well-perfused. Neurologic: No facial asymmetry. Sensation to light touch intact. Moving all extremities spontaneously.  Psychiatric: Alert and oriented to person, place, and situation. Pleasant and cooperative.     Labs on Admission: I have personally reviewed following labs and imaging studies  CBC: Recent Labs  Lab  09/23/17 1237 09/25/17 1828  WBC 10.6 10.7*  NEUTROABS  --  8.2*  HGB 8.8* 8.8*  HCT 28.4* 30.8*  MCV 79 86.3  PLT 226 644   Basic Metabolic Panel: Recent Labs  Lab 09/23/17 1237 09/25/17 1828  NA 139 136  K 3.8 3.5  CL 98 99  CO2 28 26  GLUCOSE 96 115*  BUN 14 11  CREATININE 0.96 0.94  CALCIUM 8.7 8.8*  MG  --  2.0   GFR: Estimated Creatinine Clearance: 85.4 mL/min (by C-G formula based on SCr of 0.94 mg/dL). Liver Function Tests: No results for input(s): AST, ALT, ALKPHOS, BILITOT, PROT, ALBUMIN in the last 168 hours. No results for input(s): LIPASE, AMYLASE in the last 168 hours. No results for input(s): AMMONIA in the last 168 hours. Coagulation Profile: Recent Labs  Lab 09/25/17 1828  INR 2.32   Cardiac Enzymes: No results for input(s): CKTOTAL, CKMB, CKMBINDEX, TROPONINI in the last 168 hours. BNP (last 3 results) No results for input(s): PROBNP in the last 8760 hours. HbA1C: No results for input(s): HGBA1C in the last 72 hours. CBG: No results for input(s): GLUCAP in the last 168 hours. Lipid Profile: No results for input(s): CHOL, HDL, LDLCALC, TRIG, CHOLHDL, LDLDIRECT in the last 72 hours. Thyroid Function Tests: No results for input(s): TSH, T4TOTAL, FREET4, T3FREE, THYROIDAB in the last 72 hours. Anemia Panel: No results for input(s): VITAMINB12, FOLATE, FERRITIN, TIBC, IRON, RETICCTPCT in the last 72 hours. Urine analysis:    Component Value Date/Time   COLORURINE YELLOW 09/25/2017 1945   APPEARANCEUR CLEAR 09/25/2017 1945   LABSPEC 1.015 09/25/2017 1945   PHURINE 5.0 09/25/2017 1945   GLUCOSEU NEGATIVE 09/25/2017 1945   HGBUR NEGATIVE 09/25/2017 1945   BILIRUBINUR NEGATIVE 09/25/2017 1945   KETONESUR NEGATIVE 09/25/2017 1945   PROTEINUR NEGATIVE 09/25/2017 1945   UROBILINOGEN 1.0 09/06/2010 1130   NITRITE NEGATIVE 09/25/2017 1945   LEUKOCYTESUR TRACE (A) 09/25/2017 1945   Sepsis Labs: _0 (procalcitonin:4,lacticidven:4) )No  results found for this or any previous visit (from the past 240 hour(s)).   Radiological Exams on Admission: Dg Chest 2 View  Result Date: 09/25/2017 CLINICAL DATA:  Chest pain and fever. EXAM: CHEST - 2 VIEW COMPARISON:  September 03, 2017 FINDINGS: The heart size is borderline to mildly enlarged. The hila and mediastinum are normal. No pneumothorax. No nodules or masses. Haziness over the left chest is likely due to patient rotation. No focal infiltrate or overt edema. IMPRESSION: Limited study with no acute abnormality noted. Electronically Signed   By: Dorise Bullion III M.D   On: 09/25/2017 19:23   Ct Head Wo Contrast  Result Date: 09/24/2017 CLINICAL DATA:  Headache and dizziness with syncope EXAM: CT HEAD WITHOUT CONTRAST TECHNIQUE: Contiguous axial images were obtained from the base of the skull through the vertex without intravenous contrast. COMPARISON:  September 03, 2017 FINDINGS: Brain: There is mild diffuse atrophy, stable. There is no intracranial mass, hemorrhage, extra-axial fluid collection, or midline shift. There is minimal small vessel disease in the centra semiovale bilaterally. Elsewhere gray-white compartments appear normal. No evident acute infarct. Vascular: No evident hyperdense vessel. There is mild calcification carotid siphon regions. Skull: The bony calvarium appears intact. Sinuses/Orbits: There is mucosal thickening in several ethmoid air cells. Other paranasal sinuses are clear. Orbits appear symmetric bilaterally. Other: Mastoid air cells are clear. IMPRESSION: Mild atrophy with minimal periventricular small vessel disease. No acute infarct. No mass or hemorrhage. There is mild arterial vascular calcification. There is mucosal thickening in several ethmoid air cells. Electronically Signed   By: Lowella Grip III M.D.   On: 09/24/2017 14:12    EKG: Independently reviewed. Atrial fibrillation with RVR (rate 123).   Assessment/Plan   1. Atrial fibrillation with RVR  -  Presents with purulent drainage from small sore  at sacrum, found to be in atrial fib with rates in 110's-130's  - Possibly precipitated by acute infection and/or recent reduction in metoprolol dosing (d/t low BP readings)  - Rates remained rapid despite multiple doses of Lopressor in ED and she was started on diltiazem infusion  - CHADS-VASc is at least 60 (age, gender, DM)  - Continue diltiazem infusion with titration, continue warfarin, continue metoprolol as tolerated   2. Cellulitis  - Small spontaneously draining abscess near sacrum with surrounding cellulitis noted   - She has low-grade temp and slight leukocytosis  - Blood cultures were collected in ED and she was given a dose of clindamycin  - Continue treatment with doxycycline, follow culture and clinical course   3. Insulin-dependent DM  - A1c was 7.9% earlier this month  - Managed at the SNF with Levemir 66 units qHS and Humalog sliding scale  - Check CBG's, continue Levemir and SSI    4. Normocytic anemia  - Hgb is 8.8 on admission  - Hgb dropped during recent admission with no signs of bleeding, attributed to IDA with drop likely dilutional as she presented markedly volume-depleted in setting of diarrhea  - She will continue iron-supplements    5. Depression with anxiety  - Stable, continue Effexor, Zoloft, Buspar, and Xanax    6. Chronic diastolic CHF  - This is an uncertain diagnosis per cardiology notes, possibly chronic venous stasis and OHS leading to chronic leg swelling and dyspnea  - No pulm edema or respiratory distress on admission  - Diuretics recently reduced in light of low BP readings and held this admission for same reason  - Follow daily wt and I/O's    DVT prophylaxis: warfarin  Code Status: Full  Family Communication: Discussed with patient  Consults called: None Admission status: Inpatient     Vianne Bulls, MD [facility] Pager [phone]  If 7PM-7AM, please contact  night-coverage [email or web address] Password [facility]  09/25/2017, 11:17 PM

## 2017-09-26 DIAGNOSIS — I4891 Unspecified atrial fibrillation: Secondary | ICD-10-CM

## 2017-09-26 LAB — BASIC METABOLIC PANEL
Anion gap: 11 (ref 5–15)
BUN: 12 mg/dL (ref 8–23)
CALCIUM: 8.4 mg/dL — AB (ref 8.9–10.3)
CHLORIDE: 100 mmol/L (ref 98–111)
CO2: 25 mmol/L (ref 22–32)
Creatinine, Ser: 0.92 mg/dL (ref 0.44–1.00)
GFR calc Af Amer: >60 mL/min (ref >60)
GLUCOSE: 147 mg/dL — AB (ref 70–99)
Potassium: 3.4 mmol/L — ABNORMAL LOW (ref 3.5–5.1)
Sodium: 136 mmol/L (ref 135–145)

## 2017-09-26 LAB — CBC WITH DIFFERENTIAL/PLATELET
Abs Immature Granulocytes: 0.1 10*3/uL (ref 0.0–0.1)
BASOS ABS: 0 10*3/uL (ref 0.0–0.1)
BASOS PCT: 1 %
EOS PCT: 3 %
Eosinophils Absolute: 0.2 10*3/uL (ref 0.0–0.7)
HCT: 28.1 % — ABNORMAL LOW (ref 36.0–46.0)
Hemoglobin: 7.9 g/dL — ABNORMAL LOW (ref 12.0–15.0)
Immature Granulocytes: 1 %
Lymphocytes Relative: 18 %
Lymphs Abs: 1.5 10*3/uL (ref 0.7–4.0)
MCH: 24.1 pg — AB (ref 26.0–34.0)
MCHC: 28.1 g/dL — AB (ref 30.0–36.0)
MCV: 85.7 fL (ref 78.0–100.0)
MONO ABS: 0.9 10*3/uL (ref 0.1–1.0)
Monocytes Relative: 10 %
Neutro Abs: 5.5 10*3/uL (ref 1.7–7.7)
Neutrophils Relative %: 67 %
PLATELETS: 206 10*3/uL (ref 150–400)
RBC: 3.28 MIL/uL — ABNORMAL LOW (ref 3.87–5.11)
RDW: 17.3 % — ABNORMAL HIGH (ref 11.5–15.5)
WBC: 8.2 10*3/uL (ref 4.0–10.5)

## 2017-09-26 LAB — GLUCOSE, CAPILLARY
GLUCOSE-CAPILLARY: 106 mg/dL — AB (ref 70–99)
GLUCOSE-CAPILLARY: 153 mg/dL — AB (ref 70–99)
GLUCOSE-CAPILLARY: 192 mg/dL — AB (ref 70–99)
Glucose-Capillary: 136 mg/dL — ABNORMAL HIGH (ref 70–99)

## 2017-09-26 LAB — PROTIME-INR
INR: 2.52
Prothrombin Time: 27 seconds — ABNORMAL HIGH (ref 11.4–15.2)

## 2017-09-26 LAB — HIV ANTIBODY (ROUTINE TESTING W REFLEX): HIV Screen 4th Generation wRfx: NONREACTIVE

## 2017-09-26 MED ORDER — WARFARIN SODIUM 7.5 MG PO TABS
7.5000 mg | ORAL_TABLET | Freq: Once | ORAL | Status: AC
  Administered 2017-09-26: 7.5 mg via ORAL
  Filled 2017-09-26: qty 1

## 2017-09-26 MED ORDER — MAGNESIUM SULFATE 4 GM/100ML IV SOLN
4.0000 g | Freq: Once | INTRAVENOUS | Status: AC
  Administered 2017-09-26: 4 g via INTRAVENOUS
  Filled 2017-09-26: qty 100

## 2017-09-26 NOTE — Progress Notes (Signed)
PROGRESS NOTE    Donna [name]  ZDG:[id number] DOB: [birth year] DOA: 09/25/2017 PCP: Janine Limbo, PA-C   Brief Narrative:  67 y.o. female with medical history significant for obesity (BMI 40), chronic pain, depression with anxiety, atrial fibrillation on warfarin, and insulin-dependent diabetes mellitus, now presenting to the emergency department for evaluation of lower back pain where there is a small draining wound.  Patient reports developing a small "pimple" on her sacrum that began to drain purulent material 4 days ago.  She has had low-grade fever and increasing pain at the site.  She denies chest pain, palpitations, headache, change in vision or hearing, or focal numbness or weakness.  She denies any other sores or rash on admission.  She was recently admitted with profuse watery diarrhea and volume depletion, was fluid resuscitated, and had her diuretics held.  ED Course: Upon arrival to the ED, patient is found to have a temp of 37.8 C, saturating well on 2 L/min supplemental oxygen, tachycardic with rates in the 120s to 130s, and with low-normal blood pressures.  EKG features atrial fibrillation with rate 123.  Chest x-ray is limited by patient rotation but negative for acute cardiopulmonary disease.  Chemistry panel is unremarkable.  CBC is notable for mild leukocytosis and a normocytic anemia with hemoglobin 8.8.  INR is 2.32 and urinalysis unremarkable.  Blood cultures were collected, clindamycin administered, and the patient was also treated with 500 cc normal saline, oral morphine x2, multiple doses of Lopressor without significant improvement in heart rate, and then started on diltiazem infusion.  She remains tachycardic with stable blood pressure and will be admitted for ongoing evaluation and management.    Assessment & Plan:   Principal Problem:   Atrial fibrillation with RVR (HCC) Active Problems:   OSA (obstructive sleep apnea)   Depression   Anxiety   Cellulitis of sacral  region   Normocytic anemia   Insulin-requiring or dependent type II diabetes mellitus (HCC)   Chronic pain  1. Atrial fibrillation with RVR -on Cardizem infusion rate anywhere from 86 through 105.  Replete potassium.  1 dose of magnesium.- Presents with purulent drainage from small sore at sacrum, found to be in atrial fib with rates in 110's-130's  - Possibly precipitated by acute infection and/or recent reduction in metoprolol dosing (d/t low BP readings)  - Rates remained rapid despite multiple doses of Lopressor in ED and she was started on diltiazem infusion  - CHADS-VASc is at least 15 (age, gender, DM)  - Continue diltiazem infusion with titration, continue warfarin, continue metoprolol as tolerated   2. Cellulitis  - Small spontaneously draining abscess near sacrum with surrounding cellulitis noted   - She has low-grade temp and slight leukocytosis  - Blood cultures were collected in ED and she was given a dose of clindamycin  - Continue treatment with doxycycline, follow culture and clinical course   3. Insulin-dependent DM  - A1c was 7.9% earlier this month  - Managed at the SNF with Levemir 66 units qHS and Humalog sliding scale  - Check CBG's, continue Levemir and SSI    4. Normocytic anemia  - Hgb is 8.8 on admission  - Hgb dropped during recent admission with no signs of bleeding, attributed to IDA with drop likely dilutional as she presented markedly volume-depleted in setting of diarrhea  - She will continue iron-supplements    5. Depression with anxiety  - Stable, continue Effexor, Zoloft, Buspar, and Xanax    6. Chronic diastolic CHF  -  This is an uncertain diagnosis per cardiology notes, possibly chronic venous stasis and OHS leading to chronic leg swelling and dyspnea  - No pulm edema or respiratory distress on admission  - Diuretics recently reduced in light of low BP readings and held this admission for same reason  - Follow daily wt and I/O's          DVT prophylaxis: Coumadin Code Status: Full code Family Communication: No family in the room Disposition Plan: TBD Consultants: None   Procedures: None Antimicrobials: Doxycycline Subjective: Resting in bed feels better this complain of pain in the lower back where the abscesses denies any nausea vomiting or diarrhea.   Objective: Vitals:   09/25/17 2230 09/26/17 0315 09/26/17 0800 09/26/17 1000  BP: 116/73 (!) 90/44 (!) 122/58 (!) 108/56  Pulse: (!) 105 85 83 96  Resp: (!) 24 14 18 16   Temp:  98.6 F (37 C) 98.2 F (36.8 C) 98.2 F (36.8 C)  TempSrc:  Oral Oral Oral  SpO2: 96% 90% 94%   Weight:  134.7 kg (296 lb 15.4 oz)    Height:        Intake/Output Summary (Last 24 hours) at 09/26/2017 1111 Last data filed at 09/26/2017 0644 Gross per 24 hour  Intake 582.52 ml  Output -  Net 582.52 ml   Filed Weights   09/25/17 1706 09/26/17 0315  Weight: 127 kg (280 lb) 134.7 kg (296 lb 15.4 oz)    Examination:  General exam: Appears calm and comfortable  Respiratory system: Clear to auscultation. Respiratory effort normal. Cardiovascular system: S1 & S2 heard, irregular rate and rhythm. No JVD, murmurs, rubs, gallops or clicks. No pedal edema. Gastrointestinal system: Abdomen is nondistended, soft and nontender. No organomegaly or masses felt. Normal bowel sounds heard. Central nervous system: Alert and oriented. No focal neurological deficits. Extremities: No edema Skin: No rashes, lesions or ulcers Psychiatry: Judgement and insight appear normal. Mood & affect appropriate.     Data Reviewed: I have personally reviewed following labs and imaging studies  CBC: Recent Labs  Lab 09/23/17 1237 09/25/17 1828 09/26/17 0354  WBC 10.6 10.7* 8.2  NEUTROABS  --  8.2* 5.5  HGB 8.8* 8.8* 7.9*  HCT 28.4* 30.8* 28.1*  MCV 79 86.3 85.7  PLT 226 212 962   Basic Metabolic Panel: Recent Labs  Lab 09/23/17 1237 09/25/17 1828 09/26/17 0354  NA 139 136 136   K 3.8 3.5 3.4*  CL 98 99 100  CO2 28 26 25   GLUCOSE 96 115* 147*  BUN 14 11 12   CREATININE 0.96 0.94 0.92  CALCIUM 8.7 8.8* 8.4*  MG  --  2.0  --    GFR: Estimated Creatinine Clearance: 90.2 mL/min (by C-G formula based on SCr of 0.92 mg/dL). Liver Function Tests: No results for input(s): AST, ALT, ALKPHOS, BILITOT, PROT, ALBUMIN in the last 168 hours. No results for input(s): LIPASE, AMYLASE in the last 168 hours. No results for input(s): AMMONIA in the last 168 hours. Coagulation Profile: Recent Labs  Lab 09/25/17 1828  INR 2.32   Cardiac Enzymes: No results for input(s): CKTOTAL, CKMB, CKMBINDEX, TROPONINI in the last 168 hours. BNP (last 3 results) No results for input(s): PROBNP in the last 8760 hours. HbA1C: No results for input(s): HGBA1C in the last 72 hours. CBG: Recent Labs  Lab 09/25/17 2344 09/26/17 0629  GLUCAP 121* 106*   Lipid Profile: No results for input(s): CHOL, HDL, LDLCALC, TRIG, CHOLHDL, LDLDIRECT in the last 72 hours. Thyroid  Function Tests: No results for input(s): TSH, T4TOTAL, FREET4, T3FREE, THYROIDAB in the last 72 hours. Anemia Panel: No results for input(s): VITAMINB12, FOLATE, FERRITIN, TIBC, IRON, RETICCTPCT in the last 72 hours. Sepsis Labs: No results for input(s): PROCALCITON, LATICACIDVEN in the last 168 hours.  No results found for this or any previous visit (from the past 240 hour(s)).       Radiology Studies: Dg Chest 2 View  Result Date: 09/25/2017 CLINICAL DATA:  Chest pain and fever. EXAM: CHEST - 2 VIEW COMPARISON:  September 03, 2017 FINDINGS: The heart size is borderline to mildly enlarged. The hila and mediastinum are normal. No pneumothorax. No nodules or masses. Haziness over the left chest is likely due to patient rotation. No focal infiltrate or overt edema. IMPRESSION: Limited study with no acute abnormality noted. Electronically Signed   By: Dorise Bullion III M.D   On: 09/25/2017 19:23   Ct Head Wo  Contrast  Result Date: 09/24/2017 CLINICAL DATA:  Headache and dizziness with syncope EXAM: CT HEAD WITHOUT CONTRAST TECHNIQUE: Contiguous axial images were obtained from the base of the skull through the vertex without intravenous contrast. COMPARISON:  September 03, 2017 FINDINGS: Brain: There is mild diffuse atrophy, stable. There is no intracranial mass, hemorrhage, extra-axial fluid collection, or midline shift. There is minimal small vessel disease in the centra semiovale bilaterally. Elsewhere gray-white compartments appear normal. No evident acute infarct. Vascular: No evident hyperdense vessel. There is mild calcification carotid siphon regions. Skull: The bony calvarium appears intact. Sinuses/Orbits: There is mucosal thickening in several ethmoid air cells. Other paranasal sinuses are clear. Orbits appear symmetric bilaterally. Other: Mastoid air cells are clear. IMPRESSION: Mild atrophy with minimal periventricular small vessel disease. No acute infarct. No mass or hemorrhage. There is mild arterial vascular calcification. There is mucosal thickening in several ethmoid air cells. Electronically Signed   By: Lowella Grip III M.D.   On: 09/24/2017 14:12        Scheduled Meds: . ALPRAZolam  0.25 mg Oral TID  . busPIRone  10 mg Oral Daily  . cholecalciferol  4,000 Units Oral Daily  . doxycycline  100 mg Oral Q12H  . ezetimibe  10 mg Oral QHS  . gabapentin  600 mg Oral TID  . insulin aspart  0-5 Units Subcutaneous QHS  . insulin aspart  0-9 Units Subcutaneous TID WC  . insulin detemir  40 Units Subcutaneous QHS  . magnesium oxide  400 mg Oral TID  . metoprolol succinate  25 mg Oral Daily  . potassium chloride SA  20 mEq Oral Daily  . pramipexole  0.125 mg Oral Daily  . sertraline  100 mg Oral Daily  . sodium chloride flush  3 mL Intravenous Q12H  . sodium chloride flush  3 mL Intravenous Q12H  . venlafaxine XR  150 mg Oral Q breakfast  . Warfarin - Pharmacist Dosing Inpatient   Does  not apply q1800   Continuous Infusions: . sodium chloride    . diltiazem (CARDIZEM) infusion 7.5 mg/hr (09/26/17 0644)     LOS: 1 day     Georgette Shell, MD [facility]  If 7PM-7AM, please contact night-coverage [email or web address] Password [email or web address] 09/26/2017, 11:11 AM

## 2017-09-26 NOTE — Progress Notes (Signed)
Pt took self off of CPAP stated she was frustrated with it and could not wear it anymore. RT placed pt back on 2L [place]. Will continue to monitor as needed.

## 2017-09-26 NOTE — Progress Notes (Signed)
ANTICOAGULATION CONSULT NOTE - Follow up [place] for coumadin Indication: atrial fibrillation  Allergies  Allergen Reactions  . Bee Venom Hives    Takes benadryl  . Fentanyl Other (See Comments)    Hallucinations, syncope    . Metformin And Related Diarrhea  . Other Other (See Comments)    Raw foods with seeds give her "boils".  Avoids raw strawberries, blueberries. Tolerates cooked fruits, tomato sauce, bread/grains with seeds. [facility] 10/17/13 Berries with seeds Allergy to glutamine-C-quercet-selen-brom per [facility] 09/25/17  . Alteplase Rash  . Cefepime Rash    Patient Measurements: Height: 5\' 10"  (177.8 cm) Weight: 296 lb 15.4 oz (134.7 kg) IBW/kg (Calculated) : 68.5   Vital Signs: Temp: 98.2 F (36.8 C) (07/27 1000) Temp Source: Oral (07/27 1000) BP: 108/56 (07/27 1000) Pulse Rate: 96 (07/27 1000)  Labs: Recent Labs    09/23/17 1237 09/25/17 1828 09/26/17 0354  HGB 8.8* 8.8* 7.9*  HCT 28.4* 30.8* 28.1*  PLT 226 212 206  LABPROT  --  25.3*  --   INR  --  2.32  --   CREATININE 0.96 0.94 0.92    Estimated Creatinine Clearance: 90.2 mL/min (by C-G formula based on SCr of 0.92 mg/dL).   Medical History: Past Medical History:  Diagnosis Date  . CHF (congestive heart failure) ([place])   . Chronic /permanent atrial fibrillation ([place]) 2005   On Warfarin (follwed @ [facility]); Rate controlled - On BB  . Chronic diastolic heart failure, NYHA class 2 ([place])    Updated by A. fib, hypertensive heart disease and obesity; EDP was only 7 by cardiac catheterization in September 2017  . Diabetes mellitus without complication ([place])   . Diabetic lumbosacral plexopathy ([place])   . Fibromyalgia   . Generalized anxiety disorder   . Hypokalemia 09/03/2017  . Incontinence   . Major depressive disorder   . Restless leg syndrome   . Syncope 09/03/2017    Medications:  Medications Prior to Admission  Medication Sig Dispense Refill Last Dose  .  acetaminophen (TYLENOL) 500 MG tablet Take 500 mg by mouth every 6 (six) hours as needed for mild pain.    09/25/2017 at 511  . ALPRAZolam (XANAX) 0.25 MG tablet Take 1 tablet (0.25 mg total) by mouth 3 (three) times daily. 10 tablet 0 09/25/2017 at 1300  . busPIRone (BUSPAR) 10 MG tablet Take 10 mg by mouth daily.    09/25/2017 at 900  . Cholecalciferol (VITAMIN D) 2000 units tablet Take 4,000 Units by mouth daily.    09/25/2017 at 900  . Dulaglutide (TRULICITY) 1.5 LK/5.6YB SOPN Inject 1.5 mg into the skin every Tuesday.   09/22/2017 at 900  . ezetimibe (ZETIA) 10 MG tablet Take 10 mg by mouth at bedtime.    09/24/2017 at 2100  . ferrous sulfate 325 (65 FE) MG tablet Take 325 mg by mouth See admin instructions. Take one tablet (325 mg) by mouth on Sunday, Monday, Wednesday and Friday mornings.   09/25/2017 at 900  . gabapentin (NEURONTIN) 600 MG tablet Take 600 mg by mouth 3 (three) times daily.   09/25/2017 at 1300  . guaiFENesin (MUCINEX) 600 MG 12 hr tablet Take 600 mg by mouth 2 (two) times daily as needed for cough or to loosen phlegm.    09/25/2017 at 1230  . Insulin Detemir (LEVEMIR FLEXTOUCH) 100 UNIT/ML Pen Inject 66 Units into the skin at bedtime.   09/24/2017 at 2100  . insulin lispro (HUMALOG KWIKPEN) 100 UNIT/ML  KiwkPen Inject 6-14 Units into the skin See admin instructions. Inject 6-14 units subcutaneously before each meal per sliding scale: CBG 201-250 6 units, 251-300 8 units, 301-350 10 units, 351-400 12 units, over 400 14 units and call MD   09/24/2017 at 630  . lidocaine (LIDODERM) 5 % Place 1 patch onto the skin daily as needed (right hip pain). Remove & Discard patch within 12 hours or as directed by MD    unknown  . magnesium oxide (MAG-OX) 400 MG tablet Take 400 mg by mouth 3 (three) times daily.    09/25/2017 at 1300  . Melatonin 5 MG CAPS Take 5 mg by mouth at bedtime.   09/24/2017 at 2100  . metoprolol succinate (TOPROL-XL) 25 MG 24 hr tablet Take 1 tablet (25 mg total) by mouth daily.  Take with or immediately following a meal. 30 tablet 5 09/25/2017 at 900  . miconazole (MICOTIN) 2 % powder Apply 1 application topically See admin instructions. Apply topically to abdominal folds twice daily   09/25/2017 at 900  . OxyCODONE HCl, Abuse Deter, (OXAYDO) 5 MG TABA Take 1 tablet by mouth 3 (three) times daily as needed (pain). (Patient taking differently: Take 1 tablet by mouth 3 (three) times daily as needed (moderate pain). ) 5 tablet 0 09/25/2017 at 1230  . OXYGEN Inhale 2 L into the lungs as needed (to keep SATS greater than 90%).   unknown  . pramipexole (MIRAPEX) 0.125 MG tablet Take 0.125 mg by mouth daily.   09/25/2017 at 900  . PRESCRIPTION MEDICATION Inhale into the lungs at bedtime. CPAP   09/24/2017 at 2100  . sertraline (ZOLOFT) 100 MG tablet Take 100 mg by mouth daily.   09/25/2017 at 900  . tiZANidine (ZANAFLEX) 4 MG tablet Take 4 mg by mouth 3 (three) times daily as needed for muscle spasms.   09/24/2017 at 2143  . Turmeric 500 MG CAPS Take 500 mg by mouth daily.    09/25/2017 at 900  . venlafaxine XR (EFFEXOR-XR) 150 MG 24 hr capsule Take 150 mg by mouth daily with breakfast.   09/25/2017 at 900  . warfarin (COUMADIN) 10 MG tablet Take 10 mg by mouth See admin instructions. Take one tablet (10 mg) by mouth on Tuesday nights (take 7.5 mg tablet on all other nights of the week)   09/22/2017 at 2100  . warfarin (COUMADIN) 7.5 MG tablet Take 7.5 mg by mouth See admin instructions. Take one tablet (7.5 mg) by mouth at bedtime except on Tuesdays (take 10 mg tablet on Tuesdays)   09/24/2017 at 2100  . furosemide (LASIX) 40 MG tablet Take 40 mg by mouth daily.   not yet given  . furosemide (LASIX) 80 MG tablet Take 1 tablet (80 mg total) by mouth daily. (Patient not taking: Reported on 09/25/2017) 30 tablet 2 Not Taking at Unknown time  . metolazone (ZAROXOLYN) 10 MG tablet Take 1 tablet (10 mg total) by mouth as needed (for swelling). ON HOLD PER CARDIOLOGY (Patient not taking: Reported on  09/25/2017) 90 tablet 3 Not Taking at Unknown time  . potassium chloride SA (K-DUR,KLOR-CON) 20 MEQ tablet Take 2 tablets (40 mEq total) by mouth daily. (Patient taking differently: Take 20 mEq by mouth daily. ) 60 tablet 3 09/23/2017    Assessment: 67 yo F presenting with lower back pain. PMH includes DM, obesity, chronic pain, depression, anxiety, a fib (on warfarin 10 mg daily except 7.5 on Tuesday PTA).  INR on admission therapeutic. Pharmacy consulted to  dose warfarin for atrial fibrillation.    Hgb slightly decreased at 7.9, no bleeding noted.     INR therapeutic at 2.52  Goal of Therapy:  INR 2-3 Monitor platelets by anticoagulation protocol: Yes   Plan:  Continue Warfarin 7.5 mg x 1 dose tonight Monitor daily INR, CBC, and s/s of bleeding   Gwenlyn Found, [place] D PGY1 Pharmacy Resident  Phone [phone] 09/26/2017   12:16 PM

## 2017-09-26 NOTE — Progress Notes (Signed)
Pt refusing CPAP tonight. She stated that ours was different from home machine. Informed pt to have someone bring her from home for her comfort.

## 2017-09-26 NOTE — Progress Notes (Signed)
Pt arrived to 4E via ED RN. Pt given CHG bath. Telemetry applied. VSS. Pt oriented to room and plan of care reviewed with pt. No complaints at this time. Will continue to monitor.

## 2017-09-27 ENCOUNTER — Inpatient Hospital Stay (HOSPITAL_COMMUNITY): Payer: Medicare Other

## 2017-09-27 LAB — CBC
HEMATOCRIT: 28.3 % — AB (ref 36.0–46.0)
Hemoglobin: 7.9 g/dL — ABNORMAL LOW (ref 12.0–15.0)
MCH: 23.9 pg — ABNORMAL LOW (ref 26.0–34.0)
MCHC: 27.9 g/dL — AB (ref 30.0–36.0)
MCV: 85.8 fL (ref 78.0–100.0)
PLATELETS: 184 10*3/uL (ref 150–400)
RBC: 3.3 MIL/uL — ABNORMAL LOW (ref 3.87–5.11)
RDW: 17.4 % — AB (ref 11.5–15.5)
WBC: 6.2 10*3/uL (ref 4.0–10.5)

## 2017-09-27 LAB — GLUCOSE, CAPILLARY
GLUCOSE-CAPILLARY: 161 mg/dL — AB (ref 70–99)
GLUCOSE-CAPILLARY: 146 mg/dL — AB (ref 70–99)
Glucose-Capillary: 156 mg/dL — ABNORMAL HIGH (ref 70–99)
Glucose-Capillary: 206 mg/dL — ABNORMAL HIGH (ref 70–99)

## 2017-09-27 LAB — BASIC METABOLIC PANEL
ANION GAP: 9 (ref 5–15)
BUN: 9 mg/dL (ref 8–23)
CALCIUM: 8.9 mg/dL (ref 8.9–10.3)
CO2: 30 mmol/L (ref 22–32)
Chloride: 99 mmol/L (ref 98–111)
Creatinine, Ser: 0.8 mg/dL (ref 0.44–1.00)
GFR calc Af Amer: >60 mL/min (ref >60)
GLUCOSE: 169 mg/dL — AB (ref 70–99)
POTASSIUM: 4.1 mmol/L (ref 3.5–5.1)
SODIUM: 138 mmol/L (ref 135–145)

## 2017-09-27 LAB — MAGNESIUM: MAGNESIUM: 2.1 mg/dL (ref 1.7–2.4)

## 2017-09-27 LAB — TSH: TSH: 0.524 u[IU]/mL (ref 0.350–4.500)

## 2017-09-27 LAB — PROTIME-INR
INR: 2.6
Prothrombin Time: 27.6 seconds — ABNORMAL HIGH (ref 11.4–15.2)

## 2017-09-27 MED ORDER — FUROSEMIDE 10 MG/ML IJ SOLN
40.0000 mg | Freq: Once | INTRAMUSCULAR | Status: AC
  Administered 2017-09-27: 40 mg via INTRAVENOUS
  Filled 2017-09-27: qty 4

## 2017-09-27 MED ORDER — WARFARIN SODIUM 7.5 MG PO TABS
7.5000 mg | ORAL_TABLET | Freq: Once | ORAL | Status: AC
  Administered 2017-09-27: 7.5 mg via ORAL
  Filled 2017-09-27: qty 1

## 2017-09-27 MED ORDER — DILTIAZEM HCL 60 MG PO TABS
30.0000 mg | ORAL_TABLET | Freq: Four times a day (QID) | ORAL | Status: DC
  Administered 2017-09-27 – 2017-09-30 (×13): 30 mg via ORAL
  Filled 2017-09-27 (×13): qty 1

## 2017-09-27 NOTE — Progress Notes (Signed)
Pt declined use of hospital provided CPAP- feels too enclosed.  Pt also declines to bring CPAP machine from home.  Encouraged to pt to call if she needs anything. Pt resting comfortably with no distress.

## 2017-09-27 NOTE — Progress Notes (Addendum)
ANTICOAGULATION CONSULT NOTE - Follow up Haivana Nakya for coumadin Indication: atrial fibrillation  Allergies  Allergen Reactions  . Bee Venom Hives    Takes benadryl  . Fentanyl Other (See Comments)    Hallucinations, syncope    . Metformin And Related Diarrhea  . Other Other (See Comments)    Raw foods with seeds give her "boils".  Avoids raw strawberries, blueberries. Tolerates cooked fruits, tomato sauce, bread/grains with seeds. [facility] 10/17/13 Berries with seeds Allergy to glutamine-C-quercet-selen-brom per [facility] 09/25/17  . Alteplase Rash  . Cefepime Rash    Patient Measurements: Height: 5\' 10"  (177.8 cm) Weight: 296 lb 4.8 oz (134.4 kg) IBW/kg (Calculated) : 68.5   Vital Signs: Temp: 97.9 F (36.6 C) (07/28 0806) Temp Source: Oral (07/28 0806) BP: 110/70 (07/28 0939) Pulse Rate: 90 (07/28 0939)  Labs: Recent Labs    09/25/17 1828 09/26/17 0354 09/26/17 1231 09/27/17 0429  HGB 8.8* 7.9*  --  7.9*  HCT 30.8* 28.1*  --  28.3*  PLT 212 206  --  184  LABPROT 25.3*  --  27.0* 27.6*  INR 2.32  --  2.52 2.60  CREATININE 0.94 0.92  --  0.80    Estimated Creatinine Clearance: 103.6 mL/min (by C-G formula based on SCr of 0.8 mg/dL).   Medical History: Past Medical History:  Diagnosis Date  . CHF (congestive heart failure) ([place])   . Chronic /permanent atrial fibrillation ([place]) 2005   On Warfarin (follwed @ [facility]); Rate controlled - On BB  . Chronic diastolic heart failure, NYHA class 2 ([place])    Updated by A. fib, hypertensive heart disease and obesity; EDP was only 7 by cardiac catheterization in September 2017  . Diabetes mellitus without complication ([place])   . Diabetic lumbosacral plexopathy ([place])   . Fibromyalgia   . Generalized anxiety disorder   . Hypokalemia 09/03/2017  . Incontinence   . Major depressive disorder   . Restless leg syndrome   . Syncope 09/03/2017    Medications:  Medications Prior to Admission   Medication Sig Dispense Refill Last Dose  . acetaminophen (TYLENOL) 500 MG tablet Take 500 mg by mouth every 6 (six) hours as needed for mild pain.    09/25/2017 at 511  . ALPRAZolam (XANAX) 0.25 MG tablet Take 1 tablet (0.25 mg total) by mouth 3 (three) times daily. 10 tablet 0 09/25/2017 at 1300  . busPIRone (BUSPAR) 10 MG tablet Take 10 mg by mouth daily.    09/25/2017 at 900  . Cholecalciferol (VITAMIN D) 2000 units tablet Take 4,000 Units by mouth daily.    09/25/2017 at 900  . Dulaglutide (TRULICITY) 1.5 WU/9.8JX SOPN Inject 1.5 mg into the skin every Tuesday.   09/22/2017 at 900  . ezetimibe (ZETIA) 10 MG tablet Take 10 mg by mouth at bedtime.    09/24/2017 at 2100  . ferrous sulfate 325 (65 FE) MG tablet Take 325 mg by mouth See admin instructions. Take one tablet (325 mg) by mouth on Sunday, Monday, Wednesday and Friday mornings.   09/25/2017 at 900  . gabapentin (NEURONTIN) 600 MG tablet Take 600 mg by mouth 3 (three) times daily.   09/25/2017 at 1300  . guaiFENesin (MUCINEX) 600 MG 12 hr tablet Take 600 mg by mouth 2 (two) times daily as needed for cough or to loosen phlegm.    09/25/2017 at 1230  . Insulin Detemir (LEVEMIR FLEXTOUCH) 100 UNIT/ML Pen Inject 66 Units into the skin at bedtime.  09/24/2017 at 2100  . insulin lispro (HUMALOG KWIKPEN) 100 UNIT/ML KiwkPen Inject 6-14 Units into the skin See admin instructions. Inject 6-14 units subcutaneously before each meal per sliding scale: CBG 201-250 6 units, 251-300 8 units, 301-350 10 units, 351-400 12 units, over 400 14 units and call MD   09/24/2017 at 630  . lidocaine (LIDODERM) 5 % Place 1 patch onto the skin daily as needed (right hip pain). Remove & Discard patch within 12 hours or as directed by MD    unknown  . magnesium oxide (MAG-OX) 400 MG tablet Take 400 mg by mouth 3 (three) times daily.    09/25/2017 at 1300  . Melatonin 5 MG CAPS Take 5 mg by mouth at bedtime.   09/24/2017 at 2100  . metoprolol succinate (TOPROL-XL) 25 MG 24 hr  tablet Take 1 tablet (25 mg total) by mouth daily. Take with or immediately following a meal. 30 tablet 5 09/25/2017 at 900  . miconazole (MICOTIN) 2 % powder Apply 1 application topically See admin instructions. Apply topically to abdominal folds twice daily   09/25/2017 at 900  . OxyCODONE HCl, Abuse Deter, (OXAYDO) 5 MG TABA Take 1 tablet by mouth 3 (three) times daily as needed (pain). (Patient taking differently: Take 1 tablet by mouth 3 (three) times daily as needed (moderate pain). ) 5 tablet 0 09/25/2017 at 1230  . OXYGEN Inhale 2 L into the lungs as needed (to keep SATS greater than 90%).   unknown  . pramipexole (MIRAPEX) 0.125 MG tablet Take 0.125 mg by mouth daily.   09/25/2017 at 900  . PRESCRIPTION MEDICATION Inhale into the lungs at bedtime. CPAP   09/24/2017 at 2100  . sertraline (ZOLOFT) 100 MG tablet Take 100 mg by mouth daily.   09/25/2017 at 900  . tiZANidine (ZANAFLEX) 4 MG tablet Take 4 mg by mouth 3 (three) times daily as needed for muscle spasms.   09/24/2017 at 2143  . Turmeric 500 MG CAPS Take 500 mg by mouth daily.    09/25/2017 at 900  . venlafaxine XR (EFFEXOR-XR) 150 MG 24 hr capsule Take 150 mg by mouth daily with breakfast.   09/25/2017 at 900  . warfarin (COUMADIN) 10 MG tablet Take 10 mg by mouth See admin instructions. Take one tablet (10 mg) by mouth on Tuesday nights (take 7.5 mg tablet on all other nights of the week)   09/22/2017 at 2100  . warfarin (COUMADIN) 7.5 MG tablet Take 7.5 mg by mouth See admin instructions. Take one tablet (7.5 mg) by mouth at bedtime except on Tuesdays (take 10 mg tablet on Tuesdays)   09/24/2017 at 2100  . furosemide (LASIX) 40 MG tablet Take 40 mg by mouth daily.   not yet given  . furosemide (LASIX) 80 MG tablet Take 1 tablet (80 mg total) by mouth daily. (Patient not taking: Reported on 09/25/2017) 30 tablet 2 Not Taking at Unknown time  . metolazone (ZAROXOLYN) 10 MG tablet Take 1 tablet (10 mg total) by mouth as needed (for swelling). ON  HOLD PER CARDIOLOGY (Patient not taking: Reported on 09/25/2017) 90 tablet 3 Not Taking at Unknown time  . potassium chloride SA (K-DUR,KLOR-CON) 20 MEQ tablet Take 2 tablets (40 mEq total) by mouth daily. (Patient taking differently: Take 20 mEq by mouth daily. ) 60 tablet 3 09/23/2017    Assessment: 67 yo F presenting with lower back pain. PMH includes DM, obesity, chronic pain, depression, anxiety, a fib (on warfarin 7.5 mg daily except 10  on Tuesday PTA).  INR on admission therapeutic. Pharmacy consulted to dose warfarin for atrial fibrillation.    Hgb low stable at 7.9, no bleeding noted.     INR therapeutic at 2.6 (goal 2-3)  Goal of Therapy:  INR 2-3 Monitor platelets by anticoagulation protocol: Yes   Plan:  Continue Warfarin 7.5 mg x 1 dose tonight Monitor daily INR, CBC, and s/s of bleeding   Gwenlyn Found, [place] D PGY1 Pharmacy Resident  Phone [phone] 09/27/2017   12:25 PM

## 2017-09-27 NOTE — Progress Notes (Addendum)
PROGRESS NOTE    Donna [name]  EQA:[id number] DOB: Jan 28, 1951 DOA: 09/25/2017 PCP: Janine Limbo, PA-C   Brief Narrative:66 y.o.femalewith medical history significant forobesity (BMI 40), chronic pain, depression with anxiety, atrial fibrillation on warfarin, and insulin-dependent diabetes mellitus, now presenting to the emergency department for evaluation of lower back pain where there is a small draining wound. Patientreports developing a small "pimple" on her sacrum that began to drain purulent material 4 days ago. She has had low-grade fever and increasing pain at the site. She denies chest pain, palpitations, headache, change in vision or hearing, or focal numbness or weakness. She denies any other sores or rash on admission. She was recently admitted with profuse watery diarrhea and volume depletion, was fluid resuscitated, and had her diuretics held.  ED Course:Upon arrival to the ED, patient is found tohave a temp of 37.8 C,saturating well on 2 L/min supplemental oxygen, tachycardic with rates in the 120s to 130s, and with low-normal blood pressures. EKG features atrial fibrillation with rate 123. Chest x-ray is limited by patient rotation but negative for acute cardiopulmonary disease. Chemistry panel is unremarkable. CBC is notable for mild leukocytosis and a normocytic anemia with hemoglobin 8.8. INR is 2.32 and urinalysis unremarkable. Blood cultures were collected, clindamycin administered, and the patient was also treated with 500 cc normal saline, oral morphine x2, multiple doses of Lopressor without significant improvement in heart rate, and then started on diltiazem infusion. She remains tachycardic with stable blood pressure and will be admitted for ongoing evaluation and management.     Assessment & Plan:   Principal Problem:   Atrial fibrillation with RVR (HCC) Active Problems:   OSA (obstructive sleep apnea)   Depression   Anxiety   Cellulitis of sacral  region   Normocytic anemia   Insulin-requiring or dependent type II diabetes mellitus (HCC)   Chronic pain  1] chronic atrial fibrillation with RVR reviewed cardiology notes as an outpatient.  Patient was only on metoprolol as an outpatient which is being continued.  Will DC Cardizem drip today and started on p.o. Cardizem.  Her chads vas score is 5.  Continue Coumadin.  Normal potassium and magnesium.  Last TSH is 0.5 from 09/27/2017.  2]abscess near the sacrum is open spontaneously and is draining.  Blood cultures pending.  Continue doxycycline and local care.  3] type 2 diabetes continue insulin and Humalog sliding scale.  Last hemoglobin A1c was 7.08 September 2017.   4] multiple falls she had a recent fall few weeks ago where she hit her right hip with no evidence of fracture.  Today she is complaining of pain in her left hip and she reports that she fell prior to coming to the hospital.  5] diastolic CHF-patient was complaining of shortness of breath and dyspnea on exertion this morning.  She was resting in bed.  Her saturation was in the high 80s.  She was on 2 L of oxygen at this time.  Diuresis were held since the time of admission due to the fact that patient appeared dry and was hemodynamically stable with no complaints.  Is of Lasix today.  A chest x-ray that was done today shows no evidence of effusion or edema or infiltrates.  6] arthritis with hip patient complained of left hip pain x-ray done shows just arthritis no evidence of fracture.   DVT prophylaxis: Coumadin Code Status: Full code Family Communication: No family at bedside Disposition Plan: TBD  Consultants: None  Procedures: None Antimicrobials: Doxycycline Subjective:  Complains of shortness of breath no chest pain no nausea vomiting.  Denies any diarrhea or constipation at this time.  Complaint of left hip pain which is new after the fall chest x-ray was ordered and a left hip x-ray was ordered.   Objective: Vitals:     09/27/17 0106 09/27/17 0248 09/27/17 0806 09/27/17 0939  BP: 99/61  109/62 110/70  Pulse: 86  (!) 142 90  Resp: 17  15   Temp: 98.2 F (36.8 C)  97.9 F (36.6 C)   TempSrc: Oral  Oral   SpO2: 90%  94%   Weight:  134.4 kg (296 lb 4.8 oz)    Height:        Intake/Output Summary (Last 24 hours) at 09/27/2017 1250 Last data filed at 09/27/2017 1201 Gross per 24 hour  Intake 1121.55 ml  Output 1900 ml  Net -778.45 ml   Filed Weights   09/25/17 1706 09/26/17 0315 09/27/17 0248  Weight: 127 kg (280 lb) 134.7 kg (296 lb 15.4 oz) 134.4 kg (296 lb 4.8 oz)    Examination:  General exam: Appears calm and comfortable  Respiratory system: Clear to auscultation. Respiratory effort normal. Cardiovascular system: S1 & S2 heard, RRR. No JVD, murmurs, rubs, gallops or clicks. No pedal edema. Gastrointestinal system: Abdomen is nondistended, soft and nontender. No organomegaly or masses felt. Normal bowel sounds heard. Central nervous system: Alert and oriented. No focal neurological deficits. Extremities: Symmetric 5 x 5 power. Skin: No rashes, lesions or ulcers Psychiatry: Judgement and insight appear normal. Mood & affect appropriate.     Data Reviewed: I have personally reviewed following labs and imaging studies  CBC: Recent Labs  Lab 09/23/17 1237 09/25/17 1828 09/26/17 0354 09/27/17 0429  WBC 10.6 10.7* 8.2 6.2  NEUTROABS  --  8.2* 5.5  --   HGB 8.8* 8.8* 7.9* 7.9*  HCT 28.4* 30.8* 28.1* 28.3*  MCV 79 86.3 85.7 85.8  PLT 226 212 206 329   Basic Metabolic Panel: Recent Labs  Lab 09/23/17 1237 09/25/17 1828 09/26/17 0354 09/27/17 0429  NA 139 136 136 138  K 3.8 3.5 3.4* 4.1  CL 98 99 100 99  CO2 28 26 25 30   GLUCOSE 96 115* 147* 169*  BUN 14 11 12 9   CREATININE 0.96 0.94 0.92 0.80  CALCIUM 8.7 8.8* 8.4* 8.9  MG  --  2.0  --  2.1   GFR: Estimated Creatinine Clearance: 103.6 mL/min (by C-G formula based on SCr of 0.8 mg/dL). Liver Function Tests: No results  for input(s): AST, ALT, ALKPHOS, BILITOT, PROT, ALBUMIN in the last 168 hours. No results for input(s): LIPASE, AMYLASE in the last 168 hours. No results for input(s): AMMONIA in the last 168 hours. Coagulation Profile: Recent Labs  Lab 09/25/17 1828 09/26/17 1231 09/27/17 0429  INR 2.32 2.52 2.60   Cardiac Enzymes: No results for input(s): CKTOTAL, CKMB, CKMBINDEX, TROPONINI in the last 168 hours. BNP (last 3 results) No results for input(s): PROBNP in the last 8760 hours. HbA1C: No results for input(s): HGBA1C in the last 72 hours. CBG: Recent Labs  Lab 09/26/17 1142 09/26/17 1646 09/26/17 1955 09/27/17 0607 09/27/17 1246  GLUCAP 136* 153* 192* 161* 156*   Lipid Profile: No results for input(s): CHOL, HDL, LDLCALC, TRIG, CHOLHDL, LDLDIRECT in the last 72 hours. Thyroid Function Tests: Recent Labs    09/27/17 0429  TSH 0.524   Anemia Panel: No results for input(s): VITAMINB12, FOLATE, FERRITIN, TIBC, IRON, RETICCTPCT in the last 72  hours. Sepsis Labs: No results for input(s): PROCALCITON, LATICACIDVEN in the last 168 hours.  Recent Results (from the past 240 hour(s))  Blood culture (routine x 2)     Status: None (Preliminary result)   Collection Time: 09/25/17  6:10 PM  Result Value Ref Range Status   Specimen Description BLOOD RIGHT ANTECUBITAL  Final   Special Requests   Final    BOTTLES DRAWN AEROBIC ONLY Blood Culture results may not be optimal due to an inadequate volume of blood received in culture bottles   Culture   Final    NO GROWTH < 24 HOURS Performed at [facility] [address] [place], [place] [address]    Report Status PENDING  Incomplete  Blood culture (routine x 2)     Status: None (Preliminary result)   Collection Time: 09/25/17  6:25 PM  Result Value Ref Range Status   Specimen Description BLOOD LEFT HAND  Final   Special Requests   Final    BOTTLES DRAWN AEROBIC AND ANAEROBIC Blood Culture results may not be optimal due to an  inadequate volume of blood received in culture bottles   Culture   Final    NO GROWTH < 24 HOURS Performed at [facility], [place] [address], [place], [place] [address]    Report Status PENDING  Incomplete         Radiology Studies: Dg Chest 1 View  Result Date: 09/27/2017 CLINICAL DATA:  Hypoxia. EXAM: CHEST  1 VIEW COMPARISON:  09/25/2017 FINDINGS: Cardiomegaly again identified. Mild chronic peribronchial thickening again noted. There is no evidence of focal airspace disease, pulmonary edema, suspicious pulmonary nodule/mass, pleural effusion, or pneumothorax. No acute bony abnormalities are identified. IMPRESSION: Cardiomegaly without evidence of acute cardiopulmonary disease. Electronically Signed   By: Margarette Canada M.D.   On: 09/27/2017 12:40   Dg Chest 2 View  Result Date: 09/25/2017 CLINICAL DATA:  Chest pain and fever. EXAM: CHEST - 2 VIEW COMPARISON:  September 03, 2017 FINDINGS: The heart size is borderline to mildly enlarged. The hila and mediastinum are normal. No pneumothorax. No nodules or masses. Haziness over the left chest is likely due to patient rotation. No focal infiltrate or overt edema. IMPRESSION: Limited study with no acute abnormality noted. Electronically Signed   By: Dorise Bullion III M.D   On: 09/25/2017 19:23   Dg Hip Unilat With Pelvis 2-3 Views Left  Result Date: 09/27/2017 CLINICAL DATA:  LEFT hip pain. EXAM: DG HIP (WITH OR WITHOUT PELVIS) 2-3V LEFT COMPARISON:  09/05/2017 radiographs FINDINGS: Mild degenerative changes in the LEFT hip noted. No acute fracture or dislocation identified. RIGHT hip arthroplasty changes again identified. No suspicious focal bony lesions are noted. IMPRESSION: No acute LEFT hip abnormality. Mild degenerative changes in the LEFT hip. Electronically Signed   By: Margarette Canada M.D.   On: 09/27/2017 12:41        Scheduled Meds: . ALPRAZolam  0.25 mg Oral TID  . busPIRone  10 mg Oral Daily  . cholecalciferol  4,000 Units Oral  Daily  . diltiazem  30 mg Oral Q6H  . doxycycline  100 mg Oral Q12H  . ezetimibe  10 mg Oral QHS  . furosemide  40 mg Intravenous Once  . gabapentin  600 mg Oral TID  . insulin aspart  0-5 Units Subcutaneous QHS  . insulin aspart  0-9 Units Subcutaneous TID WC  . insulin detemir  40 Units Subcutaneous QHS  . magnesium oxide  400  mg Oral TID  . metoprolol succinate  25 mg Oral Daily  . potassium chloride SA  20 mEq Oral Daily  . pramipexole  0.125 mg Oral Daily  . sertraline  100 mg Oral Daily  . sodium chloride flush  3 mL Intravenous Q12H  . sodium chloride flush  3 mL Intravenous Q12H  . venlafaxine XR  150 mg Oral Q breakfast  . warfarin  7.5 mg Oral ONCE-1800  . Warfarin - Pharmacist Dosing Inpatient   Does not apply q1800   Continuous Infusions: . sodium chloride       LOS: 2 days     Georgette Shell, MD [facility]  If 7PM-7AM, please contact night-coverage [email or web address] Password [facility] 09/27/2017, 12:50 PM

## 2017-09-28 ENCOUNTER — Inpatient Hospital Stay (HOSPITAL_COMMUNITY): Payer: Medicare Other

## 2017-09-28 ENCOUNTER — Encounter (HOSPITAL_COMMUNITY): Payer: Self-pay | Admitting: General Practice

## 2017-09-28 ENCOUNTER — Other Ambulatory Visit: Payer: Self-pay

## 2017-09-28 DIAGNOSIS — L89324 Pressure ulcer of left buttock, stage 4: Secondary | ICD-10-CM

## 2017-09-28 LAB — GLUCOSE, CAPILLARY
GLUCOSE-CAPILLARY: 150 mg/dL — AB (ref 70–99)
GLUCOSE-CAPILLARY: 165 mg/dL — AB (ref 70–99)
GLUCOSE-CAPILLARY: 178 mg/dL — AB (ref 70–99)
Glucose-Capillary: 157 mg/dL — ABNORMAL HIGH (ref 70–99)

## 2017-09-28 LAB — CBC
HEMATOCRIT: 27.7 % — AB (ref 36.0–46.0)
Hemoglobin: 7.8 g/dL — ABNORMAL LOW (ref 12.0–15.0)
MCH: 24.5 pg — AB (ref 26.0–34.0)
MCHC: 28.2 g/dL — AB (ref 30.0–36.0)
MCV: 87.1 fL (ref 78.0–100.0)
Platelets: 202 10*3/uL (ref 150–400)
RBC: 3.18 MIL/uL — ABNORMAL LOW (ref 3.87–5.11)
RDW: 17.3 % — AB (ref 11.5–15.5)
WBC: 6.2 10*3/uL (ref 4.0–10.5)

## 2017-09-28 LAB — PREPARE RBC (CROSSMATCH)

## 2017-09-28 LAB — PROTIME-INR
INR: 2.23
Prothrombin Time: 24.5 seconds — ABNORMAL HIGH (ref 11.4–15.2)

## 2017-09-28 MED ORDER — IOHEXOL 300 MG/ML  SOLN
100.0000 mL | Freq: Once | INTRAMUSCULAR | Status: AC | PRN
  Administered 2017-09-28: 100 mL via INTRAVENOUS

## 2017-09-28 MED ORDER — BUSPIRONE HCL 5 MG PO TABS
5.0000 mg | ORAL_TABLET | Freq: Two times a day (BID) | ORAL | Status: DC
  Administered 2017-09-28 – 2017-09-30 (×5): 5 mg via ORAL
  Filled 2017-09-28 (×5): qty 1

## 2017-09-28 MED ORDER — SODIUM CHLORIDE 0.9% IV SOLUTION: Freq: Once | INTRAVENOUS | Status: DC

## 2017-09-28 MED ORDER — WARFARIN SODIUM 7.5 MG PO TABS
7.5000 mg | ORAL_TABLET | Freq: Once | ORAL | Status: AC
  Administered 2017-09-28: 7.5 mg via ORAL
  Filled 2017-09-28: qty 1

## 2017-09-28 NOTE — Progress Notes (Signed)
PROGRESS NOTE    Donna [name]  WVP:[id number] DOB: Nov 26, 1950 DOA: 09/25/2017 PCP: Janine Limbo, PA-C  Brief Narrative66 y.o.femalewith medical history significant forobesity (BMI 40), chronic pain, depression with anxiety, atrial fibrillation on warfarin, and insulin-dependent diabetes mellitus, now presenting to the emergency department for evaluation of lower back pain where there is a small draining wound. Patientreports developing a small "pimple" on her sacrum that began to drain purulent material 4 days ago. She has had low-grade fever and increasing pain at the site. She denies chest pain, palpitations, headache, change in vision or hearing, or focal numbness or weakness. She denies any other sores or rash on admission. She was recently admitted with profuse watery diarrhea and volume depletion, was fluid resuscitated, and had her diuretics held.  ED Course:Upon arrival to the ED, patient is found tohave a temp of 37.8 C,saturating well on 2 L/min supplemental oxygen, tachycardic with rates in the 120s to 130s, and with low-normal blood pressures. EKG features atrial fibrillation with rate 123. Chest x-ray is limited by patient rotation but negative for acute cardiopulmonary disease. Chemistry panel is unremarkable. CBC is notable for mild leukocytosis and a normocytic anemia with hemoglobin 8.8. INR is 2.32 and urinalysis unremarkable. Blood cultures were collected, clindamycin administered, and the patient was also treated with 500 cc normal saline, oral morphine x2, multiple doses of Lopressor without significant improvement in heart rate, and then started on diltiazem infusion. She remains tachycardic with stable blood pressure and will be admitted for ongoing evaluation and management.     Assessment & Plan:   Principal Problem:   Atrial fibrillation with RVR (HCC) Active Problems:   OSA (obstructive sleep apnea)   Depression   Anxiety   Cellulitis of sacral  region   Normocytic anemia   Insulin-requiring or dependent type II diabetes mellitus (HCC)   Chronic pain  1] chronic atrial fibrillation with RVR reviewed cardiology notes as an outpatient.  Patient was only on metoprolol as an outpatient which is being continued.  Will DC Cardizem drip today and started on p.o. Cardizem.  Her chads vas score is 5.  Continue Coumadin.  Normal potassium and magnesium.  Last TSH is 0.5 from 09/27/2017.  2]abscess near the sacrum is open spontaneously and is draining.  Blood cultures pending.  Continue doxycycline and local care.wound care consult called in and spoke to wound nurse today who suspects deep abscess.  Will order CT of the pelvis.  3] type 2 diabetes continue insulin and Humalog sliding scale.  Last hemoglobin A1c was 7.08 September 2017.   4] multiple falls she had a recent fall few weeks ago where she hit her right hip with no evidence of fracture.  Today she is complaining of pain in her left hip and she reports that she fell prior to coming to the hospital.  X-rays show no evidence of fracture no acute process but significant for DJD.  5] diastolic CHF-patient was complaining of shortness of breath and dyspnea on exertion this morning.  She was resting in bed.  Her saturation was in the high 80s.  She was on 2 L of oxygen at this time.  Diuresis were held since the time of admission due to the fact that patient appeared dry and was hemodynamically stable with no complaints.  Is of Lasix today.  A chest x-ray that was done today shows no evidence of effusion or edema or infiltrates.  6] arthritis with hip patient complained of left hip pain x-ray done  shows just arthritis no evidence of fracture.        DVT prophylaxis: Coumadin Code Status: Doxycycline Family Communication: No family available Disposition Plan:  TBD Consultants:  surgery  Procedures none Antimicrobials doxycycline  Subjective: Resting in bed in no acute distress  denies any shortness of breath  nausea vomiting.  Complains of her having had loose stools for over 3 weeks.  Objective: Vitals:   09/28/17 0038 09/28/17 0423 09/28/17 0536 09/28/17 1000  BP: 99/61 100/66  (!) 93/50  Pulse: 98 85  80  Resp: 12 13    Temp: 98.6 F (37 C) 98.8 F (37.1 C)  97.9 F (36.6 C)  TempSrc: Oral Oral  Oral  SpO2: 95% 95%    Weight:  134.8 kg (297 lb 2.9 oz) 130.7 kg (288 lb 2.3 oz)   Height:        Intake/Output Summary (Last 24 hours) at 09/28/2017 1025 Last data filed at 09/28/2017 0548 Gross per 24 hour  Intake 483 ml  Output 1250 ml  Net -767 ml   Filed Weights   09/27/17 0248 09/28/17 0423 09/28/17 0536  Weight: 134.4 kg (296 lb 4.8 oz) 134.8 kg (297 lb 2.9 oz) 130.7 kg (288 lb 2.3 oz)    Examination:  General exam: Appears calm and comfortable  Respiratory system: Clear to auscultation. Respiratory effort normal. Cardiovascular system: S1 & S2 heard, RRR. No JVD, murmurs, rubs, gallops or clicks. No pedal edema. Gastrointestinal system: Abdomen is nondistended, soft and nontender. No organomegaly or masses felt. Normal bowel sounds heard. Central nervous system: Alert and oriented. No focal neurological deficits. Extremities: Symmetric 5 x 5 power. Skin: No rashes, lesions or ulcers Psychiatry: Judgement and insight appear normal. Mood & affect appropriate.     Data Reviewed: I have personally reviewed following labs and imaging studies  CBC: Recent Labs  Lab 09/23/17 1237 09/25/17 1828 09/26/17 0354 09/27/17 0429 09/28/17 0336  WBC 10.6 10.7* 8.2 6.2 6.2  NEUTROABS  --  8.2* 5.5  --   --   HGB 8.8* 8.8* 7.9* 7.9* 7.8*  HCT 28.4* 30.8* 28.1* 28.3* 27.7*  MCV 79 86.3 85.7 85.8 87.1  PLT 226 212 206 184 573   Basic Metabolic Panel: Recent Labs  Lab 09/23/17 1237 09/25/17 1828 09/26/17 0354 09/27/17 0429  NA 139 136 136 138  K 3.8 3.5 3.4* 4.1  CL 98 99 100 99  CO2 28 26 25 30   GLUCOSE 96 115* 147* 169*  BUN 14 11 12 9     CREATININE 0.96 0.94 0.92 0.80  CALCIUM 8.7 8.8* 8.4* 8.9  MG  --  2.0  --  2.1   GFR: Estimated Creatinine Clearance: 102 mL/min (by C-G formula based on SCr of 0.8 mg/dL). Liver Function Tests: No results for input(s): AST, ALT, ALKPHOS, BILITOT, PROT, ALBUMIN in the last 168 hours. No results for input(s): LIPASE, AMYLASE in the last 168 hours. No results for input(s): AMMONIA in the last 168 hours. Coagulation Profile: Recent Labs  Lab 09/25/17 1828 09/26/17 1231 09/27/17 0429 09/28/17 0336  INR 2.32 2.52 2.60 2.23   Cardiac Enzymes: No results for input(s): CKTOTAL, CKMB, CKMBINDEX, TROPONINI in the last 168 hours. BNP (last 3 results) No results for input(s): PROBNP in the last 8760 hours. HbA1C: No results for input(s): HGBA1C in the last 72 hours. CBG: Recent Labs  Lab 09/27/17 0607 09/27/17 1246 09/27/17 1627 09/27/17 2055 09/28/17 0543  GLUCAP 161* 156* 206* 146* 150*   Lipid Profile: No  results for input(s): CHOL, HDL, LDLCALC, TRIG, CHOLHDL, LDLDIRECT in the last 72 hours. Thyroid Function Tests: Recent Labs    09/27/17 0429  TSH 0.524   Anemia Panel: No results for input(s): VITAMINB12, FOLATE, FERRITIN, TIBC, IRON, RETICCTPCT in the last 72 hours. Sepsis Labs: No results for input(s): PROCALCITON, LATICACIDVEN in the last 168 hours.  Recent Results (from the past 240 hour(s))  Blood culture (routine x 2)     Status: None (Preliminary result)   Collection Time: 09/25/17  6:10 PM  Result Value Ref Range Status   Specimen Description BLOOD RIGHT ANTECUBITAL  Final   Special Requests   Final    BOTTLES DRAWN AEROBIC ONLY Blood Culture results may not be optimal due to an inadequate volume of blood received in culture bottles   Culture   Final    NO GROWTH 2 DAYS Performed at [facility] [address], [place], [place] [address]    Report Status PENDING  Incomplete  Blood culture (routine x 2)     Status: None (Preliminary result)    Collection Time: 09/25/17  6:25 PM  Result Value Ref Range Status   Specimen Description BLOOD LEFT HAND  Final   Special Requests   Final    BOTTLES DRAWN AEROBIC AND ANAEROBIC Blood Culture results may not be optimal due to an inadequate volume of blood received in culture bottles   Culture   Final    NO GROWTH 2 DAYS Performed at [facility], [place] [address], [place], [place] [address]    Report Status PENDING  Incomplete         Radiology Studies: Dg Chest 1 View  Result Date: 09/27/2017 CLINICAL DATA:  Hypoxia. EXAM: CHEST  1 VIEW COMPARISON:  09/25/2017 FINDINGS: Cardiomegaly again identified. Mild chronic peribronchial thickening again noted. There is no evidence of focal airspace disease, pulmonary edema, suspicious pulmonary nodule/mass, pleural effusion, or pneumothorax. No acute bony abnormalities are identified. IMPRESSION: Cardiomegaly without evidence of acute cardiopulmonary disease. Electronically Signed   By: Margarette Canada M.D.   On: 09/27/2017 12:40   Dg Hip Unilat With Pelvis 2-3 Views Left  Result Date: 09/27/2017 CLINICAL DATA:  LEFT hip pain. EXAM: DG HIP (WITH OR WITHOUT PELVIS) 2-3V LEFT COMPARISON:  09/05/2017 radiographs FINDINGS: Mild degenerative changes in the LEFT hip noted. No acute fracture or dislocation identified. RIGHT hip arthroplasty changes again identified. No suspicious focal bony lesions are noted. IMPRESSION: No acute LEFT hip abnormality. Mild degenerative changes in the LEFT hip. Electronically Signed   By: Margarette Canada M.D.   On: 09/27/2017 12:41        Scheduled Meds: . sodium chloride   Intravenous Once  . ALPRAZolam  0.25 mg Oral TID  . busPIRone  5 mg Oral BID  . cholecalciferol  4,000 Units Oral Daily  . diltiazem  30 mg Oral Q6H  . doxycycline  100 mg Oral Q12H  . ezetimibe  10 mg Oral QHS  . gabapentin  600 mg Oral TID  . insulin aspart  0-5 Units Subcutaneous QHS  . insulin aspart  0-9 Units Subcutaneous TID WC  .  insulin detemir  40 Units Subcutaneous QHS  . magnesium oxide  400 mg Oral TID  . metoprolol succinate  25 mg Oral Daily  . potassium chloride SA  20 mEq Oral Daily  . pramipexole  0.125 mg Oral Daily  . sertraline  100 mg Oral Daily  . sodium chloride flush  3  mL Intravenous Q12H  . sodium chloride flush  3 mL Intravenous Q12H  . venlafaxine XR  150 mg Oral Q breakfast  . warfarin  7.5 mg Oral ONCE-1800  . Warfarin - Pharmacist Dosing Inpatient   Does not apply q1800   Continuous Infusions: . sodium chloride       LOS: 3 days     Georgette Shell, MD [facility] If 7PM-7AM, please contact night-coverage [email or web address] Password [facility] 09/28/2017, 10:25 AM

## 2017-09-28 NOTE — Progress Notes (Signed)
ANTICOAGULATION CONSULT NOTE - Follow up [facility] for coumadin Indication: atrial fibrillation  Allergies  Allergen Reactions  . Bee Venom Hives    Takes benadryl  . Fentanyl Other (See Comments)    Hallucinations, syncope    . Metformin And Related Diarrhea  . Other Other (See Comments)    Raw foods with seeds give her "boils".  Avoids raw strawberries, blueberries. Tolerates cooked fruits, tomato sauce, bread/grains with seeds. [facility] 10/17/13 Berries with seeds Allergy to glutamine-C-quercet-selen-brom per [facility] 09/25/17  . Alteplase Rash  . Cefepime Rash   Patient Measurements: Height: 5\' 10"  (177.8 cm) Weight: 288 lb 2.3 oz (130.7 kg)(patient re-weighed) IBW/kg (Calculated) : 68.5  Vital Signs: Temp: 98.8 F (37.1 C) (07/29 0423) Temp Source: Oral (07/29 0423) BP: 100/66 (07/29 0423) Pulse Rate: 85 (07/29 0423)  Labs: Recent Labs    09/25/17 1828 09/26/17 0354 09/26/17 1231 09/27/17 0429 09/28/17 0336  HGB 8.8* 7.9*  --  7.9* 7.8*  HCT 30.8* 28.1*  --  28.3* 27.7*  PLT 212 206  --  184 202  LABPROT 25.3*  --  27.0* 27.6* 24.5*  INR 2.32  --  2.52 2.60 2.23  CREATININE 0.94 0.92  --  0.80  --    Estimated Creatinine Clearance: 102 mL/min (by C-G formula based on SCr of 0.8 mg/dL).  Assessment: 67 yo morbidly obese female presenting with lower back pain. PMH includes DM, obesity, chronic pain, depression, anxiety, a fib.   INR on admission therapeutic. Pharmacy consulted to dose warfarin for atrial fibrillation.  CBC is low, platelets stable and no noted bleeding complications.    HOME REGIMEN:  Warfarin 7.5 mg daily except 10 on Tuesday PTA.     INR therapeutic at 2.23 (goal 2-3)  Goal of Therapy:  INR 2-3 Monitor platelets by anticoagulation protocol: Yes   Plan:  Continue Warfarin 7.5 mg x 1 dose tonight (home dose) Monitor daily INR, CBC, and s/s of bleeding   Rober Minion, PharmD., MS Clinical Pharmacist Pager:  [phone] Thank  you for allowing pharmacy to be part of this patients care team. 09/28/2017   8:51 AM

## 2017-09-28 NOTE — Consult Note (Signed)
Reason for Consult: Abscess near the sacrum Referring Physician: Broadus John MD  Donna [name] is an 67 y.o. female.  HPI: Patient is a 67 year old female transferred via EMS from [facility] she presented with an infected open wound on her back.  She states the site has been there since she fell on it 09/03/17.  It opened and started draining approximately 4 days ago.  Her diabetes has been poorly controlled she spends majority of her time in bed or in a wheelchair secondary to pain left knee and her osteoarthritis.  She has had some fever up to 100.7 measured at the skilled nursing facility.  The fevers according to her husband were on Friday and Saturday 7/26 and 7/27.   They reported she had what started out to be a simple pimple popped open over her sacrum about 4 days prior and has had some mild drainage from the site since then.  She was ultimately seen and admitted by [facility] with low back pain and purulent drainage from a small sacral draining ulcer wound and cellulitis.  They continue treatment with oral doxycycline.  She was seen today by the wound care nursing staff.  She notes she has a wound that was present from a fall back on September 03, 2017.  Measured 2.5 x 2.5 with an unknown depth.  Yellow slough induration and discoloration.  They recommended general surgery consult and we are asked to see. She is on a heart healthy carb modified diet.  She is afebrile, and vital signs are stable.  WBC is 6.2, hemoglobin 7.8, hematocrit 27.7, platelets 202,000.  INR is 2.23.  CT of the pelvis with contrast today: Shows inflammatory changes consistent with the history.  Site is over the sacrum with no deep abscess noted.   This is the left buttocks. The white slough area is approximately 2 cm in diameter, with just some serous drainage.  The red area around the is about 4 cm in diameter.  Currently there is just some clear serous drainage on the dressing and visible.  The area is  slightly fluctuant.    Past Medical History:  Diagnosis Date  . CHF (congestive heart failure) ([place])   . Chronic /permanent atrial fibrillation ([place]) 2005   On Warfarin (follwed @ [facility]); Rate controlled - On BB  . Chronic diastolic heart failure, NYHA class 2 ([place])    Updated by A. fib, hypertensive heart disease and obesity; EDP was only 7 by cardiac catheterization in September 2017  . Diabetes mellitus without complication ([place])   . Diabetic lumbosacral plexopathy ([place])   . Fibromyalgia   . Generalized anxiety disorder   . Hypokalemia 09/03/2017  . Incontinence   . Major depressive disorder   . Restless leg syndrome   . Syncope 09/03/2017    Past Surgical History:  Procedure Laterality Date  . ABDOMINAL HYSTERECTOMY    . CARDIAC CATHETERIZATION  11/09/2015   [facility]: Nonocclusive CAD. EF 55%. EDP 7 mmHg.  [facility] CHOLECYSTECTOMY    . HIP SURGERY Right    x2  . KNEE ARTHROSCOPY Right   . MASS EXCISION     sarcoma - shoulder  . ROTATOR CUFF REPAIR Bilateral   . TONSILLECTOMY    . TRANSTHORACIC ECHOCARDIOGRAM  11/2013; 02/2016   a) [facility]: with Definity:  EF 60-65%. Mod LA dilation. Mild AoV sclerosis. Normal RHP. Indeterminate LV filling pressures. b) [facility]: Normal cavity size. EF 55-60%.no RWMA, mild  AoV sclerosis (R cusp), Mod MAC. Mild biAtrial dilation.    Family History  Problem Relation Age of Onset  . Hypertension Mother   . Heart disease Mother   . Leukemia Son     Social History:  reports that she has never smoked. She has never used smokeless tobacco. She reports that she does not drink alcohol or use drugs.  Allergies:  Allergies  Allergen Reactions  . Bee Venom Hives    Takes benadryl  . Fentanyl Other (See Comments)    Hallucinations, syncope    . Metformin And Related Diarrhea  . Other Other (See Comments)    Raw foods with seeds give her "boils".  Avoids raw strawberries, blueberries. Tolerates cooked  fruits, tomato sauce, bread/grains with seeds. [facility] 10/17/13 Berries with seeds Allergy to glutamine-C-quercet-selen-brom per [facility] 09/25/17  . Alteplase Rash  . Cefepime Rash    Medications:  Prior to Admission:  Medications Prior to Admission  Medication Sig Dispense Refill Last Dose  . acetaminophen (TYLENOL) 500 MG tablet Take 500 mg by mouth every 6 (six) hours as needed for mild pain.    09/25/2017 at 511  . ALPRAZolam (XANAX) 0.25 MG tablet Take 1 tablet (0.25 mg total) by mouth 3 (three) times daily. 10 tablet 0 09/25/2017 at 1300  . busPIRone (BUSPAR) 10 MG tablet Take 10 mg by mouth daily.    09/25/2017 at 900  . Cholecalciferol (VITAMIN D) 2000 units tablet Take 4,000 Units by mouth daily.    09/25/2017 at 900  . Dulaglutide (TRULICITY) 1.5 AO/1.3YQ SOPN Inject 1.5 mg into the skin every Tuesday.   09/22/2017 at 900  . ezetimibe (ZETIA) 10 MG tablet Take 10 mg by mouth at bedtime.    09/24/2017 at 2100  . ferrous sulfate 325 (65 FE) MG tablet Take 325 mg by mouth See admin instructions. Take one tablet (325 mg) by mouth on Sunday, Monday, Wednesday and Friday mornings.   09/25/2017 at 900  . gabapentin (NEURONTIN) 600 MG tablet Take 600 mg by mouth 3 (three) times daily.   09/25/2017 at 1300  . guaiFENesin (MUCINEX) 600 MG 12 hr tablet Take 600 mg by mouth 2 (two) times daily as needed for cough or to loosen phlegm.    09/25/2017 at 1230  . Insulin Detemir (LEVEMIR FLEXTOUCH) 100 UNIT/ML Pen Inject 66 Units into the skin at bedtime.   09/24/2017 at 2100  . insulin lispro (HUMALOG KWIKPEN) 100 UNIT/ML KiwkPen Inject 6-14 Units into the skin See admin instructions. Inject 6-14 units subcutaneously before each meal per sliding scale: CBG 201-250 6 units, 251-300 8 units, 301-350 10 units, 351-400 12 units, over 400 14 units and call MD   09/24/2017 at 630  . lidocaine (LIDODERM) 5 % Place 1 patch onto the skin daily as needed (right hip pain). Remove & Discard patch within 12 hours or as directed by MD     unknown  . magnesium oxide (MAG-OX) 400 MG tablet Take 400 mg by mouth 3 (three) times daily.    09/25/2017 at 1300  . Melatonin 5 MG CAPS Take 5 mg by mouth at bedtime.   09/24/2017 at 2100  . metoprolol succinate (TOPROL-XL) 25 MG 24 hr tablet Take 1 tablet (25 mg total) by mouth daily. Take with or immediately following a meal. 30 tablet 5 09/25/2017 at 900  . miconazole (MICOTIN) 2 % powder Apply 1 application topically See admin instructions. Apply topically to abdominal folds twice daily   09/25/2017 at 900  .  OxyCODONE HCl, Abuse Deter, (OXAYDO) 5 MG TABA Take 1 tablet by mouth 3 (three) times daily as needed (pain). (Patient taking differently: Take 1 tablet by mouth 3 (three) times daily as needed (moderate pain). ) 5 tablet 0 09/25/2017 at 1230  . OXYGEN Inhale 2 L into the lungs as needed (to keep SATS greater than 90%).   unknown  . pramipexole (MIRAPEX) 0.125 MG tablet Take 0.125 mg by mouth daily.   09/25/2017 at 900  . PRESCRIPTION MEDICATION Inhale into the lungs at bedtime. CPAP   09/24/2017 at 2100  . sertraline (ZOLOFT) 100 MG tablet Take 100 mg by mouth daily.   09/25/2017 at 900  . tiZANidine (ZANAFLEX) 4 MG tablet Take 4 mg by mouth 3 (three) times daily as needed for muscle spasms.   09/24/2017 at 2143  . Turmeric 500 MG CAPS Take 500 mg by mouth daily.    09/25/2017 at 900  . venlafaxine XR (EFFEXOR-XR) 150 MG 24 hr capsule Take 150 mg by mouth daily with breakfast.   09/25/2017 at 900  . warfarin (COUMADIN) 10 MG tablet Take 10 mg by mouth See admin instructions. Take one tablet (10 mg) by mouth on Tuesday nights (take 7.5 mg tablet on all other nights of the week)   09/22/2017 at 2100  . warfarin (COUMADIN) 7.5 MG tablet Take 7.5 mg by mouth See admin instructions. Take one tablet (7.5 mg) by mouth at bedtime except on Tuesdays (take 10 mg tablet on Tuesdays)   09/24/2017 at 2100  . furosemide (LASIX) 40 MG tablet Take 40 mg by mouth daily.   not yet given  . furosemide (LASIX) 80 MG  tablet Take 1 tablet (80 mg total) by mouth daily. (Patient not taking: Reported on 09/25/2017) 30 tablet 2 Not Taking at Unknown time  . metolazone (ZAROXOLYN) 10 MG tablet Take 1 tablet (10 mg total) by mouth as needed (for swelling). ON HOLD PER CARDIOLOGY (Patient not taking: Reported on 09/25/2017) 90 tablet 3 Not Taking at Unknown time  . potassium chloride SA (K-DUR,KLOR-CON) 20 MEQ tablet Take 2 tablets (40 mEq total) by mouth daily. (Patient taking differently: Take 20 mEq by mouth daily. ) 60 tablet 3 09/23/2017   Scheduled: . sodium chloride   Intravenous Once  . ALPRAZolam  0.25 mg Oral TID  . busPIRone  5 mg Oral BID  . cholecalciferol  4,000 Units Oral Daily  . diltiazem  30 mg Oral Q6H  . doxycycline  100 mg Oral Q12H  . ezetimibe  10 mg Oral QHS  . gabapentin  600 mg Oral TID  . insulin aspart  0-5 Units Subcutaneous QHS  . insulin aspart  0-9 Units Subcutaneous TID WC  . insulin detemir  40 Units Subcutaneous QHS  . magnesium oxide  400 mg Oral TID  . metoprolol succinate  25 mg Oral Daily  . potassium chloride SA  20 mEq Oral Daily  . pramipexole  0.125 mg Oral Daily  . sertraline  100 mg Oral Daily  . sodium chloride flush  3 mL Intravenous Q12H  . sodium chloride flush  3 mL Intravenous Q12H  . venlafaxine XR  150 mg Oral Q breakfast  . warfarin  7.5 mg Oral ONCE-1800  . Warfarin - Pharmacist Dosing Inpatient   Does not apply q1800   Continuous: . sodium chloride     GQQ:PYPPJK chloride, acetaminophen **OR** acetaminophen, guaiFENesin, lidocaine, morphine, ondansetron **OR** ondansetron (ZOFRAN) IV, oxyCODONE, senna-docusate, sodium chloride flush, tiZANidine Anti-infectives (From admission,  onward)   Start     Dose/Rate Route Frequency Ordered Stop   09/25/17 2330  doxycycline (VIBRA-TABS) tablet 100 mg     100 mg Oral Every 12 hours 09/25/17 2316     09/25/17 2145  clindamycin (CLEOCIN) capsule 600 mg     600 mg Oral  Once 09/25/17 2130 09/25/17 2149   09/25/17  2100  sulfamethoxazole-trimethoprim (BACTRIM DS,SEPTRA DS) 800-160 MG per tablet 2 tablet  Status:  Discontinued     2 tablet Oral  Once 09/25/17 2053 09/25/17 2053      Results for orders placed or performed during the hospital encounter of 09/25/17 (from the past 48 hour(s))  Glucose, capillary     Status: Abnormal   Collection Time: 09/26/17  4:46 PM  Result Value Ref Range   Glucose-Capillary 153 (H) 70 - 99 mg/dL   Comment 1 Notify RN    Comment 2 Document in Chart   Glucose, capillary     Status: Abnormal   Collection Time: 09/26/17  7:55 PM  Result Value Ref Range   Glucose-Capillary 192 (H) 70 - 99 mg/dL  TSH     Status: None   Collection Time: 09/27/17  4:29 AM  Result Value Ref Range   TSH 0.524 0.350 - 4.500 uIU/mL    Comment: Performed by a 3rd Generation assay with a functional sensitivity of <=0.01 uIU/mL. Performed at [facility], [place] [address], [place], [place] [address]   Basic metabolic panel     Status: Abnormal   Collection Time: 09/27/17  4:29 AM  Result Value Ref Range   Sodium 138 135 - 145 mmol/L   Potassium 4.1 3.5 - 5.1 mmol/L    Comment: DELTA CHECK NOTED   Chloride 99 98 - 111 mmol/L   CO2 30 22 - 32 mmol/L   Glucose, Bld 169 (H) 70 - 99 mg/dL   BUN 9 8 - 23 mg/dL   Creatinine, Ser 0.80 0.44 - 1.00 mg/dL   Calcium 8.9 8.9 - 10.3 mg/dL   GFR calc non Af Amer >60 >60 mL/min   GFR calc Af Amer >60 >60 mL/min    Comment: (NOTE) The eGFR has been calculated using the CKD EPI equation. This calculation has not been validated in all clinical situations. eGFR's persistently <60 mL/min signify possible Chronic Kidney Disease.    Anion gap 9 5 - 15    Comment: Performed at [facility] [address], [place], [place] [address]  Magnesium     Status: None   Collection Time: 09/27/17  4:29 AM  Result Value Ref Range   Magnesium 2.1 1.7 - 2.4 mg/dL    Comment: Performed at [facility] [address] [place], [place] [address]    Protime-INR     Status: Abnormal   Collection Time: 09/27/17  4:29 AM  Result Value Ref Range   Prothrombin Time 27.6 (H) 11.4 - 15.2 seconds   INR 2.60     Comment: Performed at [facility] [address], [place], [place] [address]  CBC     Status: Abnormal   Collection Time: 09/27/17  4:29 AM  Result Value Ref Range   WBC 6.2 4.0 - 10.5 K/uL   RBC 3.30 (L) 3.87 - 5.11 MIL/uL   Hemoglobin 7.9 (L) 12.0 - 15.0 g/dL   HCT 28.3 (L) 36.0 - 46.0 %   MCV 85.8 78.0 - 100.0 fL   MCH 23.9 (L) 26.0 -  34.0 pg   MCHC 27.9 (L) 30.0 - 36.0 g/dL   RDW 17.4 (H) 11.5 - 15.5 %   Platelets 184 150 - 400 K/uL    Comment: Performed at [facility], [place] [address], [place], [place] [address]  Glucose, capillary     Status: Abnormal   Collection Time: 09/27/17  6:07 AM  Result Value Ref Range   Glucose-Capillary 161 (H) 70 - 99 mg/dL  Glucose, capillary     Status: Abnormal   Collection Time: 09/27/17 12:46 PM  Result Value Ref Range   Glucose-Capillary 156 (H) 70 - 99 mg/dL   Comment 1 Notify RN    Comment 2 Document in Chart   Glucose, capillary     Status: Abnormal   Collection Time: 09/27/17  4:27 PM  Result Value Ref Range   Glucose-Capillary 206 (H) 70 - 99 mg/dL  Glucose, capillary     Status: Abnormal   Collection Time: 09/27/17  8:55 PM  Result Value Ref Range   Glucose-Capillary 146 (H) 70 - 99 mg/dL  Protime-INR     Status: Abnormal   Collection Time: 09/28/17  3:36 AM  Result Value Ref Range   Prothrombin Time 24.5 (H) 11.4 - 15.2 seconds   INR 2.23     Comment: Performed at [facility], [place] [address] [place], [place] [address]  CBC     Status: Abnormal   Collection Time: 09/28/17  3:36 AM  Result Value Ref Range   WBC 6.2 4.0 - 10.5 K/uL   RBC 3.18 (L) 3.87 - 5.11 MIL/uL   Hemoglobin 7.8 (L) 12.0 - 15.0 g/dL   HCT 27.7 (L) 36.0 - 46.0 %   MCV 87.1 78.0 - 100.0 fL   MCH 24.5 (L) 26.0 - 34.0 pg   MCHC 28.2 (L) 30.0 - 36.0 g/dL   RDW 17.3 (H) 11.5 -  15.5 %   Platelets 202 150 - 400 K/uL    Comment: Performed at [facility], [place] [address] [place], [place] [address]  Glucose, capillary     Status: Abnormal   Collection Time: 09/28/17  5:43 AM  Result Value Ref Range   Glucose-Capillary 150 (H) 70 - 99 mg/dL  Type and screen [place]     Status: None   Collection Time: 09/28/17  5:50 AM  Result Value Ref Range   ABO/RH(D) O POS    Antibody Screen POS    Sample Expiration 10/01/2017    Antibody Identification      ANTI K ANTI FYA ([place] a) Performed at [facility], [place] [address], [place], [place] [address]    Unit Number [id number]    Blood Component Type RED CELLS,LR    Unit division 00    Status of Unit REL FROM [facility]    Transfusion Status PENDING    Crossmatch Result PENDING   Prepare RBC     Status: None   Collection Time: 09/28/17  5:50 AM  Result Value Ref Range   Order Confirmation      ORDER PROCESSED BY BLOOD BANK Performed at [facility], [place] [address], [place], [place] [address]   Glucose, capillary     Status: Abnormal   Collection Time: 09/28/17 11:19 AM  Result Value Ref Range   Glucose-Capillary 165 (H) 70 - 99 mg/dL   Comment 1 Notify RN     Dg Chest 1 View  Result Date: 09/27/2017 CLINICAL DATA:  Hypoxia. EXAM: CHEST  1 VIEW COMPARISON:  09/25/2017 FINDINGS: Cardiomegaly again identified. Mild chronic peribronchial thickening again noted. There is no evidence of focal airspace disease, pulmonary edema, suspicious pulmonary nodule/mass, pleural effusion, or pneumothorax. No acute bony abnormalities are identified. IMPRESSION: Cardiomegaly without evidence of acute cardiopulmonary disease. Electronically Signed   By: Margarette Canada M.D.   On: 09/27/2017 12:40   Ct Pelvis W Contrast  Result Date: 09/28/2017 CLINICAL DATA:  Skin wound over the sacrum EXAM: CT PELVIS WITH CONTRAST TECHNIQUE: Multidetector CT imaging of the pelvis was performed using the standard  protocol following the bolus administration of intravenous contrast. CONTRAST:  15m OMNIPAQUE IOHEXOL 300 MG/ML  SOLN COMPARISON:  Multiple CTs dating back to 11/17/2009 FINDINGS: Urinary Tract: Bladder is partially distended. No obstructive changes are seen. Bowel: Scattered diverticular change of the colon is noted. No obstructive or inflammatory changes are seen. Appendix is not well visualized. Vascular/Lymphatic: No significant vascular or lymphatic abnormality is noted. Reproductive:  The uterus has been surgically removed. Other: No free fluid is noted. No hernia is seen. There is stable soft tissue density anterior to the sacrum unchanged dating back to 2011 Musculoskeletal: Right hip replacement is noted. Degenerative changes of lumbar spine are seen. Mild soft tissue edematous changes are noted in the area of clinical concern overlying the sacrum. No underlying abscess is seen. IMPRESSION: Soft tissue inflammatory changes consistent with the clinical history over the sacrum although no focal deep abscess is noted. Chronic changes stable from the prior exam. Electronically Signed   By: MInez CatalinaM.D.   On: 09/28/2017 13:23   Dg Hip Unilat With Pelvis 2-3 Views Left  Result Date: 09/27/2017 CLINICAL DATA:  LEFT hip pain. EXAM: DG HIP (WITH OR WITHOUT PELVIS) 2-3V LEFT COMPARISON:  09/05/2017 radiographs FINDINGS: Mild degenerative changes in the LEFT hip noted. No acute fracture or dislocation identified. RIGHT hip arthroplasty changes again identified. No suspicious focal bony lesions are noted. IMPRESSION: No acute LEFT hip abnormality. Mild degenerative changes in the LEFT hip. Electronically Signed   By: JMargarette CanadaM.D.   On: 09/27/2017 12:41    Review of Systems  Constitutional: Positive for fever. Negative for chills, diaphoresis, malaise/fatigue and weight loss.  HENT: Positive for hearing loss (She cannot wear her hearing aids with all the oxygen and other sensors on).   Eyes:  Negative.   Respiratory: Positive for cough, sputum production, shortness of breath and wheezing. Negative for hemoptysis.   Cardiovascular: Positive for palpitations and PND (His obstructive sleep apnea and uses CPAP machine). Negative for chest pain and leg swelling.  Gastrointestinal: Negative for abdominal pain, blood in stool, constipation, diarrhea, heartburn, melena, nausea and vomiting.  Genitourinary: Negative.   Musculoskeletal: Positive for back pain and joint pain.  Skin:       See the picture above.  Neurological: Positive for weakness.  Endo/Heme/Allergies: Bruises/bleeds easily.  Psychiatric/Behavioral: Positive for depression.   Blood pressure (!) 93/50, pulse 80, temperature 97.9 F (36.6 C), temperature source Oral, resp. rate 13, height 5' 10" (1.778 m), weight 130.7 kg (288 lb 2.3 oz), SpO2 95 %. Physical Exam  Constitutional: She is oriented to person, place, and time. She appears well-developed and well-nourished. No distress.  This is a morbidly obese woman who is essentially wheelchair bound and bedbound secondary to her osteoarthritis and weight.  Her BMI is 41.3, weight is listed as 288 pounds.  She is on oxygen.  HENT:  Head: Normocephalic and atraumatic.  Mouth/Throat: Oropharynx  is clear and moist.  Eyes: Right eye exhibits no discharge. Left eye exhibits no discharge. No scleral icterus.  Pupils are equal  Neck: Normal range of motion. No JVD present. No tracheal deviation present. No thyromegaly present.  Cardiovascular: Normal rate and normal heart sounds.  No murmur heard. She is in atrial fibrillation irregular rate  Respiratory: Effort normal. No respiratory distress. She has wheezes. She has no rales. She exhibits no tenderness.  GI: Soft. Bowel sounds are normal. She exhibits no distension and no mass. There is no tenderness. There is no rebound and no guarding.  Musculoskeletal: She exhibits edema and tenderness.  She had a right knee replaced.  She  has severe osteo-and pain with movement of the left knee.  She has some venous stasis in both lower extremities.  Lymphadenopathy:    She has no cervical adenopathy.  Neurological: She is alert and oriented to person, place, and time. No cranial nerve deficit.  Skin: Skin is warm and dry. No rash noted. She is not diaphoretic. There is erythema. No pallor.     Psychiatric: She has a normal mood and affect. Her behavior is normal. Judgment and thought content normal.    Assessment/Plan: History of syncope and fall with nonhealing left buttocks decubitus type ulcer Chronic atrial fibrillation with rapid ventricular rate -on Coumadin INR 2.23 Morbid obesity with BMI of 41.3 Wheelchair/bedbound secondary to osteoarthritis Obstructive sleep apnea with CPAP/chronic shortness of breath Type 2 diabetes insulin-dependent Diabetic lumbosacral plexopathy/fibromyalgia Diastolic congestive heart failure, NYHA class II Major depressive disorder/anxiety disorder Restless leg syndrome  Plan: I would continue conservative treatment in this area for now.  She has been on doxycycline for 3 days.  She has no fevers.  She is bedbound and unable to ambulate.  I am concerned that if we try an open this it will never heal.  She has an air mattress ordered.,  PT has gotten her up out of bed for the first time but she could barely stand.  Will review with Dr. Brantley Stage we will follow with you.    , 09/28/2017, 2:19 PM

## 2017-09-28 NOTE — Evaluation (Signed)
Clinical/Bedside Swallow Evaluation Patient Details  Name: Donna [name] MRN: [id number] Date of Birth: 03/16/1950  Today's Date: 09/28/2017 Time: SLP Start Time (ACUTE ONLY): 1500 SLP Stop Time (ACUTE ONLY): 1524 SLP Time Calculation (min) (ACUTE ONLY): 24 min  Past Medical History:  Past Medical History:  Diagnosis Date  . CHF (congestive heart failure) ([place])   . Chronic /permanent atrial fibrillation ([place]) 2005   On Warfarin (follwed @ [facility]); Rate controlled - On BB  . Chronic diastolic heart failure, NYHA class 2 ([place])    Updated by A. fib, hypertensive heart disease and obesity; EDP was only 7 by cardiac catheterization in September 2017  . Diabetes mellitus without complication ([place])   . Diabetic lumbosacral plexopathy ([place])   . Fibromyalgia   . Generalized anxiety disorder   . Hypokalemia 09/03/2017  . Incontinence   . Major depressive disorder   . Restless leg syndrome   . Syncope 09/03/2017   Past Surgical History:  Past Surgical History:  Procedure Laterality Date  . ABDOMINAL HYSTERECTOMY    . CARDIAC CATHETERIZATION  11/09/2015   [facility]: Nonocclusive CAD. EF 55%. EDP 7 mmHg.  [facility] CHOLECYSTECTOMY    . HIP SURGERY Right    x2  . KNEE ARTHROSCOPY Right   . MASS EXCISION     sarcoma - shoulder  . ROTATOR CUFF REPAIR Bilateral   . TONSILLECTOMY    . TRANSTHORACIC ECHOCARDIOGRAM  11/2013; 02/2016   a) [facility]: with Def[place]:  EF 60-65%. Mod LA dilation. Mild AoV sclerosis. Normal RHP. Indeterminate LV filling pressures. b) [facility]: Normal cavity size. EF 55-60%.no RWMA, mild AoV sclerosis (R cusp), Mod MAC. Mild biAtrial dilation.   HPI:  67 y.o. female with medical history significant for obesity (BMI 40), chronic pain, depression with anxiety, atrial fibrillation on warfarin, and insulin-dependent diabetes mellitus, now presenting to the emergency department for evaluation of lower back pain where there is a small draining  wound.  Patient reports developing a small "pimple" on her sacrum that began to drain purulent material 4 days ago.  She has had low-grade fever and increasing pain at the site.  She denies chest pain, palpitations, headache, change in vision or hearing, or focal numbness or weakness.  She denies any other sores or rash on admission.  She was recently admitted with profuse watery diarrhea and volume depletion, was fluid resuscitated, and had her diuretics held.  Past Medical History of RLS; depression/anxiety; fibromyalgia; DM; afib on Coumadin; and chronic diastolic heart failure    Assessment / Plan / Recommendation Clinical Impression  Pt endorses s/s consistent with an esophageal dysphagia - globus during every meal, occasional regurgitation.  She describes symptoms as worsening the last several weeks.  There are no indications of an oropharyngeal dysphagia - mastication was normal, swallow response was brisk, no s/s of aspiration.  Pt denies hx of GERD.  No further SLP f/u is warranted, but pt may benefit from an esophageal assessment.  D/W pt.  Our services will sign off.  SLP Visit Diagnosis: Dysphagia, unspecified (R13.10)    Aspiration Risk    low   Diet Recommendation   regular solids, thin liquids  Medication Administration: Whole meds with liquid    Other  Recommendations Recommended Consults: Consider esophageal assessment Oral Care Recommendations: Oral care BID   Follow up Recommendations None      Frequency and Duration            Prognosis  Swallow Study   General HPI: 67 y.o. female with medical history significant for obesity (BMI 40), chronic pain, depression with anxiety, atrial fibrillation on warfarin, and insulin-dependent diabetes mellitus, now presenting to the emergency department for evaluation of lower back pain where there is a small draining wound.  Patient reports developing a small "pimple" on her sacrum that began to drain purulent material 4 days  ago.  She has had low-grade fever and increasing pain at the site.  She denies chest pain, palpitations, headache, change in vision or hearing, or focal numbness or weakness.  She denies any other sores or rash on admission.  She was recently admitted with profuse watery diarrhea and volume depletion, was fluid resuscitated, and had her diuretics held.  Past Medical History of RLS; depression/anxiety; fibromyalgia; DM; afib on Coumadin; and chronic diastolic heart failure  Type of Study: Bedside Swallow Evaluation Previous Swallow Assessment: no Diet Prior to this Study: Regular;Thin liquids Temperature Spikes Noted: No Respiratory Status: Nasal cannula History of Recent Intubation: No Behavior/Cognition: Cooperative;Alert Oral Cavity Assessment: Within Functional Limits Oral Care Completed by SLP: No Oral Cavity - Dentition: Adequate natural dentition Vision: Functional for self-feeding Self-Feeding Abilities: Able to feed self Patient Positioning: Upright in bed Baseline Vocal Quality: Normal Volitional Cough: Strong Volitional Swallow: Able to elicit    Oral/Motor/Sensory Function Overall Oral Motor/Sensory Function: Within functional limits   Ice Chips Ice chips: Within functional limits   Thin Liquid Thin Liquid: Within functional limits    Nectar Thick Nectar Thick Liquid: Not tested   Honey Thick Honey Thick Liquid: Not tested   Puree Puree: Within functional limits   Solid     Solid: Within functional limits      Juan Quam Laurice 09/28/2017,3:35 PM

## 2017-09-28 NOTE — Progress Notes (Signed)
Spoke to provider, Dr. Rodena Piety who advised that we are not going to give patient blood today. Per provider verbal order I will not administer blood products.

## 2017-09-28 NOTE — Consult Note (Signed)
[place] Nurse wound consult note Patient evaluated in [place].  No visitors present Reason for Consult: Sacral abscess Wound type: The patient states she fell and struck this area on July 4th.  If I did not know this story, I would assume it is an unstagable pressure injury.  When asked if she is on her back a lot, she said she is either in sleeping or sitting. POA: Yes Measurement: 2.5 cm x 2.5 cm x unknown depth Wound bed: 100% yellow slough.  There is an area of induration and discoloration surrounding the yellow center.  I have spoke with Dr. Rodena Piety about getting surgery involved. I believe it would be in the patient's best interest to involve surgery and perhaps surgically debride the area as they deem appropriate.   Dr. Rodena Piety will pursue this with surgery. Drainage (amount, consistency, odor) scant yellow drainage on the existing foam dressing. Periwound: Hard, discolored, indurated Dressing procedure/placement/frequency: Until surgery evaluates the patient, continue with a foam dressing.  I will add a bariatric air mattress to the plan of care.  I have instructed the patient that she needs to stay off of her back.  She verbalized her understanding. Monitor the wound area(s) for worsening of condition such as: Signs/symptoms of infection,  Increase in size,  Development of or worsening of odor, Development of pain, or increased pain at the affected locations.  Notify the medical team if any of these develop.  Thank you for the consult.  Discussed plan of care with the patient and bedside nurse.  [facility] nurse will not follow at this time.  Please re-consult the [facility] team if needed.  Val Riles, RN, MSN, CWOCN, CNS-BC, pager [phone].

## 2017-09-28 NOTE — Evaluation (Signed)
Physical Therapy Evaluation Patient Details Name: Donna [name] MRN: [id number] DOB: April 26, 1950 Today's Date: 09/28/2017   History of Present Illness  67 y.o.femalewith medical history significant forobesity (BMI 40), chronic pain, depression with anxiety, atrial fibrillation on warfarin, and insulin-dependent diabetes mellitus, now presenting to the emergency department for evaluation of lower back pain where there is a small draining wound. Patientreports developing a small "pimple" on her sacrum that began to drain purulent material 4 days ago. She has had low-grade fever and increasing pain at the site. She denies chest pain, palpitations, headache, change in vision or hearing, or focal numbness or weakness. She denies any other sores or rash on admission. She was recently admitted with profuse watery diarrhea and volume depletion, was fluid resuscitated, and had her diuretics held.  Past Medical History of RLS; depression/anxiety; fibromyalgia; DM; afib on Coumadin; and chronic diastolic heart failure   Clinical Impression  Pt admitted with above diagnosis. Pt currently with functional limitations due to the deficits listed below (see PT Problem List). Pt was able to stand x 4 x with min guard assist for up to a minute with RW.  Pt moves well overall given circumstances.  Will need SNF to continue Rehab as pt was working on Rehab at [facility] prior to this hospital admit.  Will follow acutely.  Pt will benefit from skilled PT to increase their independence and safety with mobility to allow discharge to the venue listed below.      Follow Up Recommendations SNF;Supervision/Assistance - 24 hour(was at [facility] and wants to return there)    Equipment Recommendations  None recommended by PT    Recommendations for Other Services       Precautions / Restrictions Precautions Precautions: Fall Restrictions Weight Bearing Restrictions: No      Mobility  Bed Mobility Overal bed mobility:  Needs Assistance Bed Mobility: Rolling;Sidelying to Sit;Sit to Supine Rolling: Min guard(used bed rails) Sidelying to sit: Min guard   Sit to supine: Min guard   General bed mobility comments: incr time but pt able to come to EOB and get back into bed without assist.   Transfers Overall transfer level: Needs assistance Equipment used: Rolling walker (2 wheeled) Transfers: Sit to/from Stand Sit to Stand: Min assist;From elevated surface         General transfer comment: Raised bed and pt can stand from elevated surface without assist. Cues for hand placement and verbal cues and tactile cues for upright posture during stance. Pt able to perform sit to stand x 4 x with min guard assist. Stood 1 min, 30 seconds x 2 and 15 seconds.  Ambulation/Gait                Stairs            Wheelchair Mobility    Modified Rankin (Stroke Patients Only)       Balance Overall balance assessment: Needs assistance Sitting-balance support: No upper extremity supported;Feet supported Sitting balance-Leahy Scale: Fair     Standing balance support: Bilateral upper extremity supported;During functional activity Standing balance-Leahy Scale: Poor Standing balance comment: UE support on RW and assist in standing with flexed posture as well as external support                             Pertinent Vitals/Pain Pain Assessment: Faces Faces Pain Scale: Hurts little more Pain Location: buttocks Pain Descriptors / Indicators: Grimacing Pain Intervention(s): Limited activity  within patient's tolerance;Monitored during session;Premedicated before session;Repositioned    Home Living Family/patient expects to be discharged to:: Private residence Living Arrangements: Spouse/significant other Available Help at Discharge: Family;Available 24 hours/day Type of Home: Mobile home Home Access: Stairs to enter Entrance Stairs-Rails: Left Entrance Stairs-Number of Steps: 3 Home  Layout: One level Home Equipment: Bedside commode;Grab bars - toilet;Grab bars - tub/shower;Walker - 2 wheels;Hand held shower head;Wheelchair - Brewing technologist;Adaptive equipment Additional Comments: toilet aide     Prior Function Level of Independence: Needs assistance   Gait / Transfers Assistance Needed: limited household ambulation using RW vs use of wheelchair in kitchen prior to last admit , has been at [facility] doing therapy and was walking with RW there  ADL's / Homemaking Assistance Needed: husband does shopping, reports independent in ADLs with use of AE         Hand Dominance        Extremity/Trunk Assessment   Upper Extremity Assessment Upper Extremity Assessment: Defer to OT evaluation    Lower Extremity Assessment Lower Extremity Assessment: Generalized weakness    Cervical / Trunk Assessment Cervical / Trunk Assessment: Normal  Communication   Communication: No difficulties  Cognition Arousal/Alertness: Awake/alert Behavior During Therapy: Flat affect Overall Cognitive Status: Within Functional Limits for tasks assessed                                        General Comments General comments (skin integrity, edema, etc.): HR 82-92 bpm, BP 93/50 with pt slightly dizzy.  2.5 L O2 in place.     Exercises General Exercises - Lower Extremity Ankle Circles/Pumps: AROM;Both;Seated Long Arc Quad: AROM;Both;10 reps;Seated Hip Flexion/Marching: AROM;Both;10 reps;Seated   Assessment/Plan    PT Assessment Patient needs continued PT services  PT Problem List Decreased strength;Decreased range of motion;Decreased activity tolerance;Decreased mobility;Pain       PT Treatment Interventions DME instruction;Gait training;Functional mobility training;Stair training;Therapeutic activities;Therapeutic exercise;Balance training;Cognitive remediation;Patient/family education    PT Goals (Current goals can be found in the Care Plan section)  Acute  Rehab PT Goals Patient Stated Goal: regain independence PT Goal Formulation: With patient Time For Goal Achievement: 10/12/17 Potential to Achieve Goals: Good    Frequency Min 2X/week   Barriers to discharge Inaccessible home environment      Co-evaluation               AM-PAC PT "6 Clicks" Daily Activity  Outcome Measure Difficulty turning over in bed (including adjusting bedclothes, sheets and blankets)?: Unable Difficulty moving from lying on back to sitting on the side of the bed? : None Difficulty sitting down on and standing up from a chair with arms (e.g., wheelchair, bedside commode, etc,.)?: A Little Help needed moving to and from a bed to chair (including a wheelchair)?: A Lot Help needed walking in hospital room?: Total Help needed climbing 3-5 steps with a railing? : Total 6 Click Score: 12    End of Session Equipment Utilized During Treatment: Gait belt Activity Tolerance: Patient limited by fatigue Patient left: in bed;with call bell/phone within reach Nurse Communication: Mobility status PT Visit Diagnosis: Muscle weakness (generalized) (M62.81);Difficulty in walking, not elsewhere classified (R26.2)    Time: 3810-1751 PT Time Calculation (min) (ACUTE ONLY): 47 min   Charges:   PT Evaluation $PT Eval Moderate Complexity: 1 Mod PT Treatments $Therapeutic Exercise: 8-22 mins $Therapeutic Activity: 8-22 mins  [place] [phone] (pager)   Denice Paradise 09/28/2017, 11:40 AM

## 2017-09-29 LAB — BASIC METABOLIC PANEL
Anion gap: 8 (ref 5–15)
BUN: 10 mg/dL (ref 8–23)
CO2: 29 mmol/L (ref 22–32)
CREATININE: 0.81 mg/dL (ref 0.44–1.00)
Calcium: 8.9 mg/dL (ref 8.9–10.3)
Chloride: 102 mmol/L (ref 98–111)
GFR calc Af Amer: >60 mL/min (ref >60)
Glucose, Bld: 165 mg/dL — ABNORMAL HIGH (ref 70–99)
Potassium: 4.2 mmol/L (ref 3.5–5.1)
SODIUM: 139 mmol/L (ref 135–145)

## 2017-09-29 LAB — PROTIME-INR
INR: 2.47
PROTHROMBIN TIME: 26.6 s — AB (ref 11.4–15.2)

## 2017-09-29 LAB — CBC
HCT: 27.7 % — ABNORMAL LOW (ref 36.0–46.0)
Hemoglobin: 7.8 g/dL — ABNORMAL LOW (ref 12.0–15.0)
MCH: 24.4 pg — AB (ref 26.0–34.0)
MCHC: 28.2 g/dL — AB (ref 30.0–36.0)
MCV: 86.6 fL (ref 78.0–100.0)
PLATELETS: 208 10*3/uL (ref 150–400)
RBC: 3.2 MIL/uL — ABNORMAL LOW (ref 3.87–5.11)
RDW: 17.3 % — ABNORMAL HIGH (ref 11.5–15.5)
WBC: 6.7 10*3/uL (ref 4.0–10.5)

## 2017-09-29 LAB — GLUCOSE, CAPILLARY
GLUCOSE-CAPILLARY: 158 mg/dL — AB (ref 70–99)
Glucose-Capillary: 182 mg/dL — ABNORMAL HIGH (ref 70–99)
Glucose-Capillary: 148 mg/dL — ABNORMAL HIGH (ref 70–99)
Glucose-Capillary: 195 mg/dL — ABNORMAL HIGH (ref 70–99)

## 2017-09-29 LAB — PREPARE RBC (CROSSMATCH)

## 2017-09-29 LAB — MRSA PCR SCREENING: MRSA BY PCR: NEGATIVE

## 2017-09-29 MED ORDER — WARFARIN SODIUM 10 MG PO TABS
10.0000 mg | ORAL_TABLET | Freq: Once | ORAL | Status: AC
  Administered 2017-09-29: 10 mg via ORAL
  Filled 2017-09-29: qty 1

## 2017-09-29 MED ORDER — COLLAGENASE 250 UNIT/GM EX OINT
TOPICAL_OINTMENT | Freq: Every day | CUTANEOUS | Status: DC
  Administered 2017-09-29 – 2017-09-30 (×2): via TOPICAL
  Filled 2017-09-29: qty 30

## 2017-09-29 MED ORDER — DIPHENHYDRAMINE HCL 25 MG PO CAPS
25.0000 mg | ORAL_CAPSULE | Freq: Four times a day (QID) | ORAL | Status: DC | PRN
  Administered 2017-09-29: 25 mg via ORAL
  Filled 2017-09-29: qty 1

## 2017-09-29 MED ORDER — SODIUM CHLORIDE 0.9% IV SOLUTION
Freq: Once | INTRAVENOUS | Status: AC
  Administered 2017-09-29: 15:00:00 via INTRAVENOUS

## 2017-09-29 MED ORDER — ACETAMINOPHEN 325 MG PO TABS
650.0000 mg | ORAL_TABLET | Freq: Four times a day (QID) | ORAL | Status: DC | PRN
  Administered 2017-09-29: 650 mg via ORAL
  Filled 2017-09-29: qty 2

## 2017-09-29 NOTE — Progress Notes (Signed)
Physical Therapy Wound Evaluation and Treatment Patient Details  Name: Donna [name] MRN: [id number] Date of Birth: [birth year]  Today's Date: 09/29/2017 Time: 4332-9518 Time Calculation (min): 30 min  Subjective  Subjective: Pt pleasant and agreeable to hydrotherapy Patient and Family Stated Goals: "How long is this going to take to heal?" Date of Onset: (Unknown) Prior Treatments: foam dressings  Pain Score:  Pt reports moderate pain throughout treatment session  Wound Assessment  Pressure Injury 09/29/17 Unstageable - Full thickness tissue loss in which the base of the ulcer is covered by slough (yellow, tan, gray, green or brown) and/or eschar (tan, brown or black) in the wound bed. (Active)  Wound Image   09/29/2017  2:23 PM  Dressing Type ABD;Barrier Film (skin prep);Gauze (Comment);Moist to dry 09/29/2017  2:23 PM  Dressing Changed;Clean;Dry;Intact 09/29/2017  2:23 PM  Dressing Change Frequency Daily 09/29/2017  2:23 PM  State of Healing Non-healing 09/29/2017  2:23 PM  Site / Wound Assessment Yellow 09/29/2017  2:23 PM  % Wound base Red or Granulating 0% 09/29/2017  2:23 PM  % Wound base Yellow/Fibrinous Exudate 100% 09/29/2017  2:23 PM  % Wound base Black/Eschar 0% 09/29/2017  2:23 PM  % Wound base Other/Granulation Tissue (Comment) 0% 09/29/2017  2:23 PM  Peri-wound Assessment Intact;Pink 09/29/2017  2:23 PM  Wound Length (cm) 2.5 cm 09/29/2017  2:23 PM  Wound Width (cm) 1.2 cm 09/29/2017  2:23 PM  Wound Depth (cm) 0.2 cm 09/29/2017  2:23 PM  Wound Surface Area (cm^2) 3 cm^2 09/29/2017  2:23 PM  Wound Volume (cm^3) 0.6 cm^3 09/29/2017  2:23 PM  Tunneling (cm) 0 09/29/2017  2:23 PM  Undermining (cm) 0 09/29/2017  2:23 PM  Margins Unattached edges (unapproximated) 09/29/2017  2:23 PM  Drainage Amount Moderate 09/29/2017  2:23 PM  Drainage Description Serosanguineous 09/29/2017  2:23 PM  Treatment Debridement (Selective);Hydrotherapy (Pulse lavage);Packing (Saline gauze) 09/29/2017  2:23 PM    Santyl applied to wound bed prior to applying dressing.    Hydrotherapy Pulsed lavage therapy - wound location: Sacrum Pulsed Lavage with Suction (psi): 12 psi Pulsed Lavage with Suction - Normal Saline Used: 1000 mL Pulsed Lavage Tip: Tip with splash shield Selective Debridement Selective Debridement - Location: Sacrum Selective Debridement - Tools Used: Scissors;Forceps Selective Debridement - Tissue Removed: Yellow unviable tissue   Wound Assessment and Plan  Wound Therapy - Assess/Plan/Recommendations Wound Therapy - Clinical Statement: Pt presents to hydrotherapy with unstagable pressure injury. Noted a moderate amount of bleeding with bedside debridement and amount removed limited due to this. Pt will benefit from continued hydrotherapy for further selective removal of unviable tissue, and to promote wound bed healing.  Wound Therapy - Functional Problem List: Decreased tolerance for OOB mobility due to pain Factors Delaying/Impairing Wound Healing: Altered sensation;Immobility;Diabetes Mellitus Hydrotherapy Plan: Debridement;Dressing change;Patient/family education;Pulsatile lavage with suction Wound Therapy - Frequency: 6X / week Wound Therapy - Follow Up Recommendations: Skilled nursing facility Wound Plan: See above  Wound Therapy Goals- Improve the function of patient's integumentary system by progressing the wound(s) through the phases of wound healing (inflammation - proliferation - remodeling) by: Decrease Necrotic Tissue to: 0% Decrease Necrotic Tissue - Progress: Goal set today Increase Granulation Tissue to: 100% Increase Granulation Tissue - Progress: Goal set today Goals/treatment plan/discharge plan were made with and agreed upon by patient/family: Yes Time For Goal Achievement: 7 days Wound Therapy - Potential for Goals: Good  Goals will be updated until maximal potential achieved or discharge criteria met.  Discharge criteria:  when goals achieved, discharge  from hospital, MD decision/surgical intervention, no progress towards goals, refusal/missing three consecutive treatments without notification or medical reason.  GP     Thelma Comp 09/29/2017, 2:34 PM   Rolinda Roan, PT, DPT Acute Rehabilitation Services Pager: [phone]

## 2017-09-29 NOTE — Progress Notes (Signed)
ANTICOAGULATION CONSULT NOTE - Follow up [facility] for coumadin Indication: atrial fibrillation  Allergies  Allergen Reactions  . Bee Venom Hives    Takes benadryl  . Fentanyl Other (See Comments)    Hallucinations, syncope    . Metformin And Related Diarrhea  . Other Other (See Comments)    Raw foods with seeds give her "boils".  Avoids raw strawberries, blueberries. Tolerates cooked fruits, tomato sauce, bread/grains with seeds. [facility] 10/17/13 Berries with seeds Allergy to glutamine-C-quercet-selen-brom per [facility] 09/25/17  . Alteplase Rash  . Cefepime Rash   Patient Measurements: Height: 5\' 10"  (177.8 cm) Weight: 288 lb 2.3 oz (130.7 kg)(patient re-weighed) IBW/kg (Calculated) : 68.5  Vital Signs: Temp: 97.6 F (36.4 C) (07/30 0848) Temp Source: Oral (07/30 0848) BP: 110/56 (07/30 0433) Pulse Rate: 97 (07/30 0848)  Labs: Recent Labs    09/27/17 0429 09/28/17 0336 09/29/17 0403  HGB 7.9* 7.8* 7.8*  HCT 28.3* 27.7* 27.7*  PLT 184 202 208  LABPROT 27.6* 24.5* 26.6*  INR 2.60 2.23 2.47  CREATININE 0.80  --  0.81   Estimated Creatinine Clearance: 100.7 mL/min (by C-G formula based on SCr of 0.81 mg/dL).  Assessment: 67 yo morbidly obese female presenting with lower back pain. PMH includes DM, obesity, chronic pain, depression, anxiety, a fib.   INR on admission therapeutic. Pharmacy consulted to dose warfarin for atrial fibrillation.  CBC is low, platelets stable and no noted bleeding complications.    HOME REGIMEN:  Warfarin 7.5 mg daily except 10 on Tuesday PTA.     INR therapeutic at 2.47 (goal 2-3)  Goal of Therapy:  INR 2-3 Monitor platelets by anticoagulation protocol: Yes   Plan:  Warfarin 10 mg po x 1 tonight (per home dose) Monitor daily INR, CBC, and s/s of bleeding   Thank you Anette Guarneri, PharmD [phone] 09/29/2017   9:36 AM

## 2017-09-29 NOTE — Progress Notes (Signed)
PROGRESS NOTE    Donna [name]  FVC:[id number] DOB: 1950-06-16 DOA: 09/25/2017 PCP: Janine Limbo, PA-C  Brief Narrative:67 y.o.femalewith medical history significant forobesity (BMI 40), chronic pain, depression with anxiety, atrial fibrillation on warfarin, and insulin-dependent diabetes mellitus, now presenting to the emergency department for evaluation of lower back pain where there is a small draining wound. Patientreports developing a small "pimple" on her sacrum that began to drain purulent material 4 days ago. She has had low-grade fever and increasing pain at the site. She denies chest pain, palpitations, headache, change in vision or hearing, or focal numbness or weakness. She denies any other sores or rash on admission. She was recently admitted with profuse watery diarrhea and volume depletion, was fluid resuscitated, and had her diuretics held.  ED Course:Upon arrival to the ED, patient is found tohave a temp of 37.8 C,saturating well on 2 L/min supplemental oxygen, tachycardic with rates in the 120s to 130s, and with low-normal blood pressures. EKG features atrial fibrillation with rate 123. Chest x-ray is limited by patient rotation but negative for acute cardiopulmonary disease. Chemistry panel is unremarkable. CBC is notable for mild leukocytosis and a normocytic anemia with hemoglobin 8.8. INR is 2.32 and urinalysis unremarkable. Blood cultures were collected, clindamycin administered, and the patient was also treated with 500 cc normal saline, oral morphine x2, multiple doses of Lopressor without significant improvement in heart rate, and then started on diltiazem infusion. She remains tachycardic with stable blood pressure and will be admitted for ongoing evaluation and management.       Assessment & Plan:   Principal Problem:   Atrial fibrillation with RVR (HCC) Active Problems:   OSA (obstructive sleep apnea)   Depression   Anxiety   Cellulitis of  sacral region   Normocytic anemia   Insulin-requiring or dependent type II diabetes mellitus (HCC)   Chronic pain   Decubitus ulcer of ischium, left, stage IV (HCC) 1]chronic atrial fibrillation with RVR reviewed cardiology notes as an outpatient. Patient was only on metoprolol as an outpatient which is being continued. Will DC Cardizem drip today and started on p.o. Cardizem. Her chads vas score is 5. Continue Coumadin.Normal potassium and magnesium. Last TSH is 0.5 from 09/27/2017.  2]abscess near the sacrum is open spontaneously and is draining. Blood cultures pending. Continue doxycycline and local care.wound care consult called in and spoke to wound nurse today who suspects deep abscess.  Will order CT of the pelvis.  3]type 2 diabetes continue insulin and Humalog sliding scale. Last hemoglobin A1c was 7.08 September 2017.   4]multiple falls she had a recent fall few weeks ago where she hit her right hip with no evidence of fracture. Today she is complaining of pain in her left hip and she reports that she fell prior to coming to the hospital.  X-rays show no evidence of fracture no acute process but significant for DJD.  5]diastolic CHF-patient was complaining of shortness of breath and dyspnea on exertion this morning. She was resting in bed. Her saturation was in the high 80s. She was on 2 L of oxygen at this time. Diuresis were held since the time of admission due to the fact that patient appeared dry and was hemodynamically stable with no complaints. Is of Lasix today. A chest x-ray that was done today shows no evidence of effusion or edema or infiltrates.  6]arthritis with hip patient complained of left hip pain x-ray done shows just arthritis no evidence of fracture.  7] chronic disease  FOBT is still pending.  Patient has several antibodies for blood transfusion.  However our blood bank has gotten 2 units from [place].  Since she is on Coumadin and with slow  dropping of hemoglobin we will transfuse her 1 unit.      DVT prophylaxis: Coumadin Code Status full Doxycycline Family Communication: None Disposition Plan: If general surgery is not planning for debridement we will plan for discharge back to skilled nursing facility tomorrow. Consultants:   General surgery  Procedures: None Antimicrobials doxycycline  Subjective: Patient resting in bed in no acute distress denies chest pain shortness of breath wondering about the what the plan is regarding her sacral decubitus or abscess.  Objective: Vitals:   09/29/17 0410 09/29/17 0433 09/29/17 0848 09/29/17 1150  BP: (!) 89/41 (!) 110/56  (!) 110/54  Pulse: (!) 105 (!) 112 97 92  Resp: 19 14  20   Temp: 97.9 F (36.6 C)  97.6 F (36.4 C) 97.7 F (36.5 C)  TempSrc: Oral  Oral Oral  SpO2: 94% 93% 93% 96%  Weight:      Height:        Intake/Output Summary (Last 24 hours) at 09/29/2017 1222 Last data filed at 09/29/2017 0441 Gross per 24 hour  Intake 483 ml  Output 1350 ml  Net -867 ml   Filed Weights   09/27/17 0248 09/28/17 0423 09/28/17 0536  Weight: 134.4 kg (296 lb 4.8 oz) 134.8 kg (297 lb 2.9 oz) 130.7 kg (288 lb 2.3 oz)    Examination:  General exam: Appears calm and comfortable  Respiratory system: Clear to auscultation. Respiratory effort normal. Cardiovascular system: S1 & S2 heard, irregirreg. No JVD, murmurs, rubs, gallops or clicks. No pedal edema. Gastrointestinal system: Abdomen is nondistended, soft and nontender. No organomegaly or masses felt. Normal bowel sounds heard. Central nervous system: Alert and oriented. No focal neurological deficits. Extremities: Symmetric 5 x 5 power. Skin: No rashes, lesions or ulcers Psychiatry: Judgement and insight appear normal. Mood & affect appropriate.     Data Reviewed: I have personally reviewed following labs and imaging studies  CBC: Recent Labs  Lab 09/25/17 1828 09/26/17 0354 09/27/17 0429 09/28/17 0336  09/29/17 0403  WBC 10.7* 8.2 6.2 6.2 6.7  NEUTROABS 8.2* 5.5  --   --   --   HGB 8.8* 7.9* 7.9* 7.8* 7.8*  HCT 30.8* 28.1* 28.3* 27.7* 27.7*  MCV 86.3 85.7 85.8 87.1 86.6  PLT 212 206 184 202 175   Basic Metabolic Panel: Recent Labs  Lab 09/23/17 1237 09/25/17 1828 09/26/17 0354 09/27/17 0429 09/29/17 0403  NA 139 136 136 138 139  K 3.8 3.5 3.4* 4.1 4.2  CL 98 99 100 99 102  CO2 28 26 25 30 29   GLUCOSE 96 115* 147* 169* 165*  BUN 14 11 12 9 10   CREATININE 0.96 0.94 0.92 0.80 0.81  CALCIUM 8.7 8.8* 8.4* 8.9 8.9  MG  --  2.0  --  2.1  --    GFR: Estimated Creatinine Clearance: 100.7 mL/min (by C-G formula based on SCr of 0.81 mg/dL). Liver Function Tests: No results for input(s): AST, ALT, ALKPHOS, BILITOT, PROT, ALBUMIN in the last 168 hours. No results for input(s): LIPASE, AMYLASE in the last 168 hours. No results for input(s): AMMONIA in the last 168 hours. Coagulation Profile: Recent Labs  Lab 09/25/17 1828 09/26/17 1231 09/27/17 0429 09/28/17 0336 09/29/17 0403  INR 2.32 2.52 2.60 2.23 2.47   Cardiac Enzymes: No results for input(s): CKTOTAL, CKMB,  CKMBINDEX, TROPONINI in the last 168 hours. BNP (last 3 results) No results for input(s): PROBNP in the last 8760 hours. HbA1C: No results for input(s): HGBA1C in the last 72 hours. CBG: Recent Labs  Lab 09/28/17 1119 09/28/17 1607 09/28/17 2119 09/29/17 0625 09/29/17 1148  GLUCAP 165* 157* 178* 182* 148*   Lipid Profile: No results for input(s): CHOL, HDL, LDLCALC, TRIG, CHOLHDL, LDLDIRECT in the last 72 hours. Thyroid Function Tests: Recent Labs    09/27/17 0429  TSH 0.524   Anemia Panel: No results for input(s): VITAMINB12, FOLATE, FERRITIN, TIBC, IRON, RETICCTPCT in the last 72 hours. Sepsis Labs: No results for input(s): PROCALCITON, LATICACIDVEN in the last 168 hours.  Recent Results (from the past 240 hour(s))  Blood culture (routine x 2)     Status: None (Preliminary result)   Collection  Time: 09/25/17  6:10 PM  Result Value Ref Range Status   Specimen Description BLOOD RIGHT ANTECUBITAL  Final   Special Requests   Final    BOTTLES DRAWN AEROBIC ONLY Blood Culture results may not be optimal due to an inadequate volume of blood received in culture bottles   Culture   Final    NO GROWTH 3 DAYS Performed at [facility], [place] [address] [place], [place] [address]    Report Status PENDING  Incomplete  Blood culture (routine x 2)     Status: None (Preliminary result)   Collection Time: 09/25/17  6:25 PM  Result Value Ref Range Status   Specimen Description BLOOD LEFT HAND  Final   Special Requests   Final    BOTTLES DRAWN AEROBIC AND ANAEROBIC Blood Culture results may not be optimal due to an inadequate volume of blood received in culture bottles   Culture   Final    NO GROWTH 3 DAYS Performed at [facility], [place] [address] [place], [place] [address]    Report Status PENDING  Incomplete  MRSA PCR Screening     Status: None   Collection Time: 09/28/17 11:06 PM  Result Value Ref Range Status   MRSA by PCR NEGATIVE NEGATIVE Final    Comment:        The GeneXpert MRSA Assay (FDA approved for NASAL specimens only), is one component of a comprehensive MRSA colonization surveillance program. It is not intended to diagnose MRSA infection nor to guide or monitor treatment for MRSA infections. Performed at [facility], [place] [address] [place], [place] [address]          Radiology Studies: Ct Pelvis W Contrast  Result Date: 09/28/2017 CLINICAL DATA:  Skin wound over the sacrum EXAM: CT PELVIS WITH CONTRAST TECHNIQUE: Multidetector CT imaging of the pelvis was performed using the standard protocol following the bolus administration of intravenous contrast. CONTRAST:  174mL OMNIPAQUE IOHEXOL 300 MG/ML  SOLN COMPARISON:  Multiple CTs dating back to 11/17/2009 FINDINGS: Urinary Tract: Bladder is partially distended. No obstructive changes are seen.  Bowel: Scattered diverticular change of the colon is noted. No obstructive or inflammatory changes are seen. Appendix is not well visualized. Vascular/Lymphatic: No significant vascular or lymphatic abnormality is noted. Reproductive:  The uterus has been surgically removed. Other: No free fluid is noted. No hernia is seen. There is stable soft tissue density anterior to the sacrum unchanged dating back to 2011 Musculoskeletal: Right hip replacement is noted. Degenerative changes of lumbar spine are seen. Mild soft tissue edematous changes are noted in the area of clinical concern overlying the sacrum.  No underlying abscess is seen. IMPRESSION: Soft tissue inflammatory changes consistent with the clinical history over the sacrum although no focal deep abscess is noted. Chronic changes stable from the prior exam. Electronically Signed   By: Inez Catalina M.D.   On: 09/28/2017 13:23        Scheduled Meds: . sodium chloride   Intravenous Once  . ALPRAZolam  0.25 mg Oral TID  . busPIRone  5 mg Oral BID  . cholecalciferol  4,000 Units Oral Daily  . collagenase   Topical Daily  . diltiazem  30 mg Oral Q6H  . doxycycline  100 mg Oral Q12H  . ezetimibe  10 mg Oral QHS  . gabapentin  600 mg Oral TID  . insulin aspart  0-5 Units Subcutaneous QHS  . insulin aspart  0-9 Units Subcutaneous TID WC  . insulin detemir  40 Units Subcutaneous QHS  . magnesium oxide  400 mg Oral TID  . metoprolol succinate  25 mg Oral Daily  . potassium chloride SA  20 mEq Oral Daily  . pramipexole  0.125 mg Oral Daily  . sertraline  100 mg Oral Daily  . sodium chloride flush  3 mL Intravenous Q12H  . sodium chloride flush  3 mL Intravenous Q12H  . venlafaxine XR  150 mg Oral Q breakfast  . warfarin  10 mg Oral ONCE-1800  . Warfarin - Pharmacist Dosing Inpatient   Does not apply q1800   Continuous Infusions: . sodium chloride       LOS: 4 days    Georgette Shell, MD [facility] If 7PM-7AM, please  contact night-coverage [email or web address] Password [email or web address] 09/29/2017, 12:22 PM

## 2017-09-29 NOTE — Progress Notes (Addendum)
[facility] Progress Note     Subjective: CC: decubitus wound Patient reports a lot of pain in L buttock and difficulty with mobilizing due to pain.   Objective: Vital signs in last 24 hours: Temp:  [97.6 F (36.4 C)-98.5 F (36.9 C)] 97.6 F (36.4 C) (07/30 0848) Pulse Rate:  [80-112] 97 (07/30 0848) Resp:  [14-19] 14 (07/30 0433) BP: (89-125)/(41-76) 110/56 (07/30 0433) SpO2:  [92 %-94 %] 93 % (07/30 0848) Last BM Date: 09/25/17  Intake/Output from previous day: 07/29 0701 - 07/30 0700 In: 603 [P.O.:600; I.V.:3] Out: 1350 [Urine:1350] Intake/Output this shift: No intake/output data recorded.  PE: Gen:  Alert, NAD, pleasant Card:  Regular rate and rhythm Pulm:  Normal effort, clear to auscultation bilaterally Abd: Soft, non-tender, non-distended Skin: sacral wound with central slough ~2 cm, some serous drainage, surrounding erythema Psych: A&Ox3   Lab Results:  Recent Labs    09/28/17 0336 09/29/17 0403  WBC 6.2 6.7  HGB 7.8* 7.8*  HCT 27.7* 27.7*  PLT 202 208   BMET Recent Labs    09/27/17 0429 09/29/17 0403  NA 138 139  K 4.1 4.2  CL 99 102  CO2 30 29  GLUCOSE 169* 165*  BUN 9 10  CREATININE 0.80 0.81  CALCIUM 8.9 8.9   PT/INR Recent Labs    09/28/17 0336 09/29/17 0403  LABPROT 24.5* 26.6*  INR 2.23 2.47   CMP     Component Value Date/Time   NA 139 09/29/2017 0403   NA 139 09/23/2017 1237   K 4.2 09/29/2017 0403   CL 102 09/29/2017 0403   CO2 29 09/29/2017 0403   GLUCOSE 165 (H) 09/29/2017 0403   BUN 10 09/29/2017 0403   BUN 14 09/23/2017 1237   CREATININE 0.81 09/29/2017 0403   CREATININE 1.36 (H) 03/24/2016 1110   CALCIUM 8.9 09/29/2017 0403   PROT 5.9 (L) 09/07/2017 0332   ALBUMIN 2.5 (L) 09/07/2017 0332   AST 17 09/07/2017 0332   ALT 12 09/07/2017 0332   ALKPHOS 65 09/07/2017 0332   BILITOT 0.6 09/07/2017 0332   GFRNONAA >60 09/29/2017 0403   GFRAA >60 09/29/2017 0403   Lipase  No results found for:  LIPASE     Studies/Results: Dg Chest 1 View  Result Date: 09/27/2017 CLINICAL DATA:  Hypoxia. EXAM: CHEST  1 VIEW COMPARISON:  09/25/2017 FINDINGS: Cardiomegaly again identified. Mild chronic peribronchial thickening again noted. There is no evidence of focal airspace disease, pulmonary edema, suspicious pulmonary nodule/mass, pleural effusion, or pneumothorax. No acute bony abnormalities are identified. IMPRESSION: Cardiomegaly without evidence of acute cardiopulmonary disease. Electronically Signed   By: Margarette Canada M.D.   On: 09/27/2017 12:40   Ct Pelvis W Contrast  Result Date: 09/28/2017 CLINICAL DATA:  Skin wound over the sacrum EXAM: CT PELVIS WITH CONTRAST TECHNIQUE: Multidetector CT imaging of the pelvis was performed using the standard protocol following the bolus administration of intravenous contrast. CONTRAST:  156mL OMNIPAQUE IOHEXOL 300 MG/ML  SOLN COMPARISON:  Multiple CTs dating back to 11/17/2009 FINDINGS: Urinary Tract: Bladder is partially distended. No obstructive changes are seen. Bowel: Scattered diverticular change of the colon is noted. No obstructive or inflammatory changes are seen. Appendix is not well visualized. Vascular/Lymphatic: No significant vascular or lymphatic abnormality is noted. Reproductive:  The uterus has been surgically removed. Other: No free fluid is noted. No hernia is seen. There is stable soft tissue density anterior to the sacrum unchanged dating back to 2011 Musculoskeletal: Right hip replacement is  noted. Degenerative changes of lumbar spine are seen. Mild soft tissue edematous changes are noted in the area of clinical concern overlying the sacrum. No underlying abscess is seen. IMPRESSION: Soft tissue inflammatory changes consistent with the clinical history over the sacrum although no focal deep abscess is noted. Chronic changes stable from the prior exam. Electronically Signed   By: Inez Catalina M.D.   On: 09/28/2017 13:23   Dg Hip Unilat With  Pelvis 2-3 Views Left  Result Date: 09/27/2017 CLINICAL DATA:  LEFT hip pain. EXAM: DG HIP (WITH OR WITHOUT PELVIS) 2-3V LEFT COMPARISON:  09/05/2017 radiographs FINDINGS: Mild degenerative changes in the LEFT hip noted. No acute fracture or dislocation identified. RIGHT hip arthroplasty changes again identified. No suspicious focal bony lesions are noted. IMPRESSION: No acute LEFT hip abnormality. Mild degenerative changes in the LEFT hip. Electronically Signed   By: Margarette Canada M.D.   On: 09/27/2017 12:41    Anti-infectives: Anti-infectives (From admission, onward)   Start     Dose/Rate Route Frequency Ordered Stop   09/25/17 2330  doxycycline (VIBRA-TABS) tablet 100 mg     100 mg Oral Every 12 hours 09/25/17 2316     09/25/17 2145  clindamycin (CLEOCIN) capsule 600 mg     600 mg Oral  Once 09/25/17 2130 09/25/17 2149   09/25/17 2100  sulfamethoxazole-trimethoprim (BACTRIM DS,SEPTRA DS) 800-160 MG per tablet 2 tablet  Status:  Discontinued     2 tablet Oral  Once 09/25/17 2053 09/25/17 2053       Assessment/Plan Chronic atrial fibrillation with rapid ventricular rate -on Coumadin INR 2.23 Morbid obesity with BMI of 41.3 Wheelchair/bedbound secondary to osteoarthritis Obstructive sleep apnea with CPAP/chronic shortness of breath Type 2 diabetes insulin-dependent Diabetic lumbosacral plexopathy/fibromyalgia Diastolic congestive heart failure, NYHA class II Major depressive disorder/anxiety disorder Restless leg syndrome  History of syncope and fall with nonhealing left buttocks decubitus type ulcer, stage II - wound similar in appearance to photo yesterday - minimal drainage on foam dressing - patient not mobile - patient is unlikely to heal with surgical debridement but could try hydrotherapy/santyl - hopefully will improve with more conservative measures  FEN: HH/CM diet VTE: SCDs ID: bactrim/clindamycin 7/26; PO doxycycline 7/26>> Foley: wick   LOS: 4 days    Brigid Re , [facility] 09/29/2017, 9:39 AM Pager: [phone] Consults: [phone] Mon-Fri 7:00 am-4:30 pm Sat-Sun 7:00 am-11:30 am

## 2017-09-29 NOTE — Progress Notes (Signed)
Pt. Refused cpap for tonight. 

## 2017-09-29 NOTE — Progress Notes (Signed)
Patient informed me that she gets constipated with opioids. When she needs pain medicine and oxycodone is offered, she only wants 5mg  not 10mg  due to the constipation issue. I will inform the day shift RN of this. Lajoyce Corners, RN

## 2017-09-30 DIAGNOSIS — K5900 Constipation, unspecified: Secondary | ICD-10-CM | POA: Diagnosis not present

## 2017-09-30 DIAGNOSIS — E876 Hypokalemia: Secondary | ICD-10-CM | POA: Diagnosis not present

## 2017-09-30 DIAGNOSIS — I482 Chronic atrial fibrillation: Secondary | ICD-10-CM | POA: Diagnosis not present

## 2017-09-30 DIAGNOSIS — I878 Other specified disorders of veins: Secondary | ICD-10-CM | POA: Diagnosis not present

## 2017-09-30 DIAGNOSIS — Z79899 Other long term (current) drug therapy: Secondary | ICD-10-CM | POA: Diagnosis not present

## 2017-09-30 DIAGNOSIS — F419 Anxiety disorder, unspecified: Secondary | ICD-10-CM | POA: Diagnosis not present

## 2017-09-30 DIAGNOSIS — R41841 Cognitive communication deficit: Secondary | ICD-10-CM | POA: Diagnosis not present

## 2017-09-30 DIAGNOSIS — R2689 Other abnormalities of gait and mobility: Secondary | ICD-10-CM | POA: Diagnosis not present

## 2017-09-30 DIAGNOSIS — L988 Other specified disorders of the skin and subcutaneous tissue: Secondary | ICD-10-CM | POA: Diagnosis not present

## 2017-09-30 DIAGNOSIS — Z794 Long term (current) use of insulin: Secondary | ICD-10-CM | POA: Diagnosis not present

## 2017-09-30 DIAGNOSIS — E119 Type 2 diabetes mellitus without complications: Secondary | ICD-10-CM | POA: Diagnosis not present

## 2017-09-30 DIAGNOSIS — F329 Major depressive disorder, single episode, unspecified: Secondary | ICD-10-CM | POA: Diagnosis not present

## 2017-09-30 DIAGNOSIS — I10 Essential (primary) hypertension: Secondary | ICD-10-CM | POA: Diagnosis not present

## 2017-09-30 DIAGNOSIS — G4733 Obstructive sleep apnea (adult) (pediatric): Secondary | ICD-10-CM | POA: Diagnosis not present

## 2017-09-30 DIAGNOSIS — Z7901 Long term (current) use of anticoagulants: Secondary | ICD-10-CM | POA: Diagnosis not present

## 2017-09-30 DIAGNOSIS — Z96641 Presence of right artificial hip joint: Secondary | ICD-10-CM | POA: Diagnosis not present

## 2017-09-30 DIAGNOSIS — I5032 Chronic diastolic (congestive) heart failure: Secondary | ICD-10-CM | POA: Diagnosis not present

## 2017-09-30 DIAGNOSIS — R739 Hyperglycemia, unspecified: Secondary | ICD-10-CM | POA: Diagnosis not present

## 2017-09-30 DIAGNOSIS — L89322 Pressure ulcer of left buttock, stage 2: Secondary | ICD-10-CM | POA: Diagnosis not present

## 2017-09-30 DIAGNOSIS — L089 Local infection of the skin and subcutaneous tissue, unspecified: Secondary | ICD-10-CM | POA: Diagnosis not present

## 2017-09-30 DIAGNOSIS — Z7401 Bed confinement status: Secondary | ICD-10-CM | POA: Diagnosis not present

## 2017-09-30 DIAGNOSIS — D649 Anemia, unspecified: Secondary | ICD-10-CM | POA: Diagnosis not present

## 2017-09-30 DIAGNOSIS — L89159 Pressure ulcer of sacral region, unspecified stage: Secondary | ICD-10-CM | POA: Diagnosis not present

## 2017-09-30 DIAGNOSIS — M2550 Pain in unspecified joint: Secondary | ICD-10-CM | POA: Diagnosis not present

## 2017-09-30 DIAGNOSIS — E860 Dehydration: Secondary | ICD-10-CM | POA: Diagnosis not present

## 2017-09-30 DIAGNOSIS — M62838 Other muscle spasm: Secondary | ICD-10-CM | POA: Diagnosis not present

## 2017-09-30 DIAGNOSIS — G8929 Other chronic pain: Secondary | ICD-10-CM | POA: Diagnosis not present

## 2017-09-30 DIAGNOSIS — R42 Dizziness and giddiness: Secondary | ICD-10-CM | POA: Diagnosis not present

## 2017-09-30 DIAGNOSIS — M1712 Unilateral primary osteoarthritis, left knee: Secondary | ICD-10-CM | POA: Diagnosis not present

## 2017-09-30 DIAGNOSIS — R55 Syncope and collapse: Secondary | ICD-10-CM | POA: Diagnosis not present

## 2017-09-30 DIAGNOSIS — M25562 Pain in left knee: Secondary | ICD-10-CM | POA: Diagnosis not present

## 2017-09-30 DIAGNOSIS — L03319 Cellulitis of trunk, unspecified: Secondary | ICD-10-CM | POA: Diagnosis not present

## 2017-09-30 DIAGNOSIS — S31829A Unspecified open wound of left buttock, initial encounter: Secondary | ICD-10-CM | POA: Diagnosis not present

## 2017-09-30 DIAGNOSIS — R05 Cough: Secondary | ICD-10-CM | POA: Diagnosis not present

## 2017-09-30 DIAGNOSIS — I4891 Unspecified atrial fibrillation: Secondary | ICD-10-CM | POA: Diagnosis not present

## 2017-09-30 DIAGNOSIS — E1140 Type 2 diabetes mellitus with diabetic neuropathy, unspecified: Secondary | ICD-10-CM | POA: Diagnosis not present

## 2017-09-30 DIAGNOSIS — R110 Nausea: Secondary | ICD-10-CM | POA: Diagnosis not present

## 2017-09-30 DIAGNOSIS — R600 Localized edema: Secondary | ICD-10-CM | POA: Diagnosis not present

## 2017-09-30 DIAGNOSIS — E785 Hyperlipidemia, unspecified: Secondary | ICD-10-CM | POA: Diagnosis not present

## 2017-09-30 DIAGNOSIS — I110 Hypertensive heart disease with heart failure: Secondary | ICD-10-CM | POA: Diagnosis not present

## 2017-09-30 DIAGNOSIS — M6281 Muscle weakness (generalized): Secondary | ICD-10-CM | POA: Diagnosis not present

## 2017-09-30 DIAGNOSIS — R21 Rash and other nonspecific skin eruption: Secondary | ICD-10-CM | POA: Diagnosis not present

## 2017-09-30 DIAGNOSIS — R278 Other lack of coordination: Secondary | ICD-10-CM | POA: Diagnosis not present

## 2017-09-30 DIAGNOSIS — N390 Urinary tract infection, site not specified: Secondary | ICD-10-CM | POA: Diagnosis not present

## 2017-09-30 DIAGNOSIS — Z96651 Presence of right artificial knee joint: Secondary | ICD-10-CM | POA: Diagnosis not present

## 2017-09-30 DIAGNOSIS — R2681 Unsteadiness on feet: Secondary | ICD-10-CM | POA: Diagnosis not present

## 2017-09-30 DIAGNOSIS — E559 Vitamin D deficiency, unspecified: Secondary | ICD-10-CM | POA: Diagnosis not present

## 2017-09-30 LAB — CULTURE, BLOOD (ROUTINE X 2)
Culture: NO GROWTH
Culture: NO GROWTH

## 2017-09-30 LAB — GLUCOSE, CAPILLARY
Glucose-Capillary: 118 mg/dL — ABNORMAL HIGH (ref 70–99)
Glucose-Capillary: 146 mg/dL — ABNORMAL HIGH (ref 70–99)

## 2017-09-30 LAB — CBC
HEMATOCRIT: 29.9 % — AB (ref 36.0–46.0)
Hemoglobin: 8.5 g/dL — ABNORMAL LOW (ref 12.0–15.0)
MCH: 24.7 pg — AB (ref 26.0–34.0)
MCHC: 28.4 g/dL — ABNORMAL LOW (ref 30.0–36.0)
MCV: 86.9 fL (ref 78.0–100.0)
Platelets: 220 10*3/uL (ref 150–400)
RBC: 3.44 MIL/uL — AB (ref 3.87–5.11)
RDW: 17.3 % — AB (ref 11.5–15.5)
WBC: 5.9 10*3/uL (ref 4.0–10.5)

## 2017-09-30 LAB — BASIC METABOLIC PANEL
Anion gap: 10 (ref 5–15)
BUN: 8 mg/dL (ref 8–23)
CO2: 29 mmol/L (ref 22–32)
Calcium: 9.3 mg/dL (ref 8.9–10.3)
Chloride: 103 mmol/L (ref 98–111)
Creatinine, Ser: 0.69 mg/dL (ref 0.44–1.00)
GFR calc Af Amer: >60 mL/min (ref >60)
GLUCOSE: 118 mg/dL — AB (ref 70–99)
POTASSIUM: 4.7 mmol/L (ref 3.5–5.1)
Sodium: 142 mmol/L (ref 135–145)

## 2017-09-30 LAB — PROTIME-INR
INR: 2.68
Prothrombin Time: 28.3 seconds — ABNORMAL HIGH (ref 11.4–15.2)

## 2017-09-30 MED ORDER — ONDANSETRON HCL 4 MG PO TABS: 4.0000 mg | ORAL_TABLET | Freq: Four times a day (QID) | ORAL | 0 refills | Status: DC | PRN

## 2017-09-30 MED ORDER — COLLAGENASE 250 UNIT/GM EX OINT: TOPICAL_OINTMENT | Freq: Every day | CUTANEOUS | 0 refills | Status: DC

## 2017-09-30 MED ORDER — DOXYCYCLINE HYCLATE 100 MG PO TABS: 100.0000 mg | ORAL_TABLET | Freq: Two times a day (BID) | ORAL | 0 refills | Status: DC

## 2017-09-30 MED ORDER — WARFARIN SODIUM 7.5 MG PO TABS: 7.5000 mg | ORAL_TABLET | Freq: Once | ORAL | Status: DC

## 2017-09-30 MED ORDER — POTASSIUM CHLORIDE CRYS ER 20 MEQ PO TBCR: 20.0000 meq | EXTENDED_RELEASE_TABLET | Freq: Every day | ORAL | 0 refills | Status: DC

## 2017-09-30 MED ORDER — DILTIAZEM HCL 30 MG PO TABS: 30.0000 mg | ORAL_TABLET | Freq: Four times a day (QID) | ORAL | 1 refills | Status: DC

## 2017-09-30 MED ORDER — INSULIN DETEMIR 100 UNIT/ML ~~LOC~~ SOLN: 40.0000 [IU] | Freq: Every day | SUBCUTANEOUS | 11 refills | Status: DC

## 2017-09-30 MED ORDER — SENNOSIDES-DOCUSATE SODIUM 8.6-50 MG PO TABS: 1.0000 | ORAL_TABLET | Freq: Every evening | ORAL | Status: DC | PRN

## 2017-09-30 NOTE — Progress Notes (Signed)
IV and telemetry discontinued. CCMD notified. [facility] here to transport patient at this time to [facility].

## 2017-09-30 NOTE — Progress Notes (Signed)
Clinical Social Worker facilitated patient discharge including contacting patient family and facility to confirm patient discharge plans.  Clinical information faxed to facility and family agreeable with plan.  CSW arranged ambulance transport via [facility] to [facility] .  RN to call [phone] (pt will go in room 106) for  report prior to discharge.  Clinical Social Worker will sign off for now as social work intervention is no longer needed. Please consult [place] again if new need arises.  Rhea Pink, MSW, [place]

## 2017-09-30 NOTE — NC FL2 (Signed)
[place] LEVEL OF CARE SCREENING TOOL     IDENTIFICATION  Patient Name: Donna [name] Birthdate: 03-Aug-1950 Sex: female Admission Date (Current Location): 09/25/2017  [facility] and [place] Number:      Facility and Address:         Provider Number:    Attending Physician Name and Address:  Georgette Shell, MD  Relative Name and Phone Number:  Gearline Spilman, [phone]    Current Level of Care: Hospital Recommended Level of Care: [place] Prior Approval Number:    Date Approved/Denied:   PASRR Number: [id number]  Discharge Plan: SNF    Current Diagnoses: Patient Active Problem List   Diagnosis Date Noted  . Decubitus ulcer of ischium, left, stage IV ([place]) 09/28/2017  . Atrial fibrillation with RVR ([place]) 09/25/2017  . Cellulitis of sacral region 09/25/2017  . Normocytic anemia 09/25/2017  . Insulin-requiring or dependent type II diabetes mellitus ([place]) 09/25/2017  . Chronic pain 09/25/2017  . Chronic atrial fibrillation with RVR ([facility])   . History of seizures 09/03/2017  . Depression 02/09/2017  . Anxiety 02/09/2017  . Venous stasis of both lower extremities 07/25/2016  . Essential hypertension 07/25/2016  . Anticoagulant long-term use 07/15/2016  . Anatomical narrow angle borderline glaucoma of both eyes 04/02/2016  . Chronic diastolic heart failure ([place]) 02/08/2016  . Chronic atrial fibrillation ([place]): CHA2DS2-VASc Score 5; on Warfarin 02/08/2016  . Morbid obesity ([place]) 02/08/2016  . OSA (obstructive sleep apnea) 02/08/2016  . Pre-operative cardiovascular examination 02/08/2016  . Medication management 02/08/2016  . Fibromyalgia 08/07/2012  . Restless legs syndrome 08/07/2012    Orientation RESPIRATION BLADDER Height & Weight     Self, Time, Situation, Place  O2(2L) Incontinent, External catheter Weight: 288 lb 2.3 oz (130.7 kg)(patient re-weighed) Height:  5\' 10"  (177.8 cm)  BEHAVIORAL SYMPTOMS/MOOD NEUROLOGICAL BOWEL  NUTRITION STATUS      Continent Diet(heart healthy carb modified)  AMBULATORY STATUS COMMUNICATION OF NEEDS Skin   Extensive Assist Verbally Other (Comment)(pt has wound)                       Personal Care Assistance Level of Assistance    Bathing Assistance: Limited assistance Feeding assistance: Independent Dressing Assistance: Limited assistance     Functional Limitations Info  Sight, Hearing, Speech Sight Info: Adequate Hearing Info: Adequate Speech Info: Adequate    SPECIAL CARE FACTORS FREQUENCY  OT (By licensed OT), PT (By licensed PT)     PT Frequency: 5x wk OT Frequency: 5x wk            Contractures Contractures Info: Not present    Additional Factors Info    Code Status Info: Full Code Allergies Info: BEE VENOM, FENTANYL, METFORMIN AND RELATED, OTHER, ALTEPLASE, CEFEPIME            Current Medications (09/30/2017):  This is the current hospital active medication list Current Facility-Administered Medications  Medication Dose Route Frequency Provider Last Rate Last Dose  . 0.9 %  sodium chloride infusion (Manually program via Guardrails IV Fluids)   Intravenous Once Georgette Shell, MD      . 0.9 %  sodium chloride infusion  250 mL Intravenous PRN Opyd, Ilene Qua, MD      . acetaminophen (TYLENOL) tablet 650 mg  650 mg Oral Q6H PRN Georgette Shell, MD   650 mg at 09/29/17 1455  . ALPRAZolam (XANAX) tablet 0.25 mg  0.25 mg Oral TID Opyd, Ilene Qua, MD  0.25 mg at 09/30/17 0945  . busPIRone (BUSPAR) tablet 5 mg  5 mg Oral BID Georgette Shell, MD   5 mg at 09/30/17 0945  . cholecalciferol (VITAMIN D) tablet 4,000 Units  4,000 Units Oral Daily Opyd, Ilene Qua, MD   4,000 Units at 09/30/17 0945  . collagenase (SANTYL) ointment   Topical Daily Rayburn, Kelly A, PA-C      . diltiazem (CARDIZEM) tablet 30 mg  30 mg Oral Q6H Georgette Shell, MD   30 mg at 09/30/17 0524  . diphenhydrAMINE (BENADRYL) capsule 25 mg  25 mg Oral Q6H PRN  Georgette Shell, MD   25 mg at 09/29/17 1455  . doxycycline (VIBRA-TABS) tablet 100 mg  100 mg Oral Q12H Opyd, Ilene Qua, MD   100 mg at 09/30/17 0946  . ezetimibe (ZETIA) tablet 10 mg  10 mg Oral QHS Opyd, Ilene Qua, MD   10 mg at 09/29/17 2131  . gabapentin (NEURONTIN) tablet 600 mg  600 mg Oral TID Vianne Bulls, MD   600 mg at 09/30/17 0947  . guaiFENesin (MUCINEX) 12 hr tablet 600 mg  600 mg Oral BID PRN Opyd, Ilene Qua, MD   600 mg at 09/26/17 2050  . insulin aspart (novoLOG) injection 0-5 Units  0-5 Units Subcutaneous QHS Opyd, Ilene Qua, MD   Stopped at 09/29/17 2114  . insulin aspart (novoLOG) injection 0-9 Units  0-9 Units Subcutaneous TID WC Opyd, Ilene Qua, MD   2 Units at 09/29/17 1619  . insulin detemir (LEVEMIR) injection 40 Units  40 Units Subcutaneous QHS Vianne Bulls, MD   40 Units at 09/29/17 2131  . lidocaine (LIDODERM) 5 % 1 patch  1 patch Transdermal Daily PRN Opyd, Ilene Qua, MD      . magnesium oxide (MAG-OX) tablet 400 mg  400 mg Oral TID Vianne Bulls, MD   400 mg at 09/30/17 0947  . metoprolol succinate (TOPROL-XL) 24 hr tablet 25 mg  25 mg Oral Daily Opyd, Ilene Qua, MD   25 mg at 09/30/17 0946  . morphine (MSIR) tablet 15 mg  15 mg Oral Q4H PRN Opyd, Ilene Qua, MD      . ondansetron (ZOFRAN) tablet 4 mg  4 mg Oral Q6H PRN Opyd, Ilene Qua, MD   4 mg at 09/26/17 1429   Or  . ondansetron (ZOFRAN) injection 4 mg  4 mg Intravenous Q6H PRN Opyd, Ilene Qua, MD      . oxyCODONE (Oxy IR/ROXICODONE) immediate release tablet 5-10 mg  5-10 mg Oral TID PRN Vianne Bulls, MD   10 mg at 09/28/17 2322  . potassium chloride SA (K-DUR,KLOR-CON) CR tablet 20 mEq  20 mEq Oral Daily Opyd, Ilene Qua, MD   20 mEq at 09/30/17 0947  . pramipexole (MIRAPEX) tablet 0.125 mg  0.125 mg Oral Daily Opyd, Ilene Qua, MD   0.125 mg at 09/30/17 0948  . senna-docusate (Senokot-S) tablet 1 tablet  1 tablet Oral QHS PRN Opyd, Ilene Qua, MD      . sertraline (ZOLOFT) tablet 100 mg  100 mg Oral  Daily Opyd, Ilene Qua, MD   100 mg at 09/30/17 0948  . sodium chloride flush (NS) 0.9 % injection 3 mL  3 mL Intravenous Q12H Opyd, Ilene Qua, MD   3 mL at 09/30/17 0949  . sodium chloride flush (NS) 0.9 % injection 3 mL  3 mL Intravenous Q12H Opyd, Ilene Qua, MD   3 mL at 09/30/17 0949  .  sodium chloride flush (NS) 0.9 % injection 3 mL  3 mL Intravenous PRN Opyd, Ilene Qua, MD      . tiZANidine (ZANAFLEX) tablet 4 mg  4 mg Oral TID PRN Opyd, Ilene Qua, MD   4 mg at 09/28/17 0520  . venlafaxine XR (EFFEXOR-XR) 24 hr capsule 150 mg  150 mg Oral Q breakfast Opyd, Ilene Qua, MD   150 mg at 09/30/17 0807  . warfarin (COUMADIN) tablet 7.5 mg  7.5 mg Oral ONCE-1800 Corinda Gubler, RPH      . Warfarin - Pharmacist Dosing Inpatient   Does not apply q1800 Opyd, Ilene Qua, MD         Discharge Medications: Please see discharge summary for a list of discharge medications.  Relevant Imaging Results:  Relevant Lab Results:   Additional Information SS# [id number] cpap at night (medium, full face mask 5-20)   Wende Neighbors, LCSW

## 2017-09-30 NOTE — Progress Notes (Signed)
Physical Therapy Wound Treatment Patient Details  Name: Donna [name] MRN: [id number] Date of Birth: [birth year]  Today's Date: 09/30/2017 Time: 6073-7106 Time Calculation (min): 58 min  Subjective  Subjective: Pt pleasant and agreeable to hydrotherapy Patient and Family Stated Goals: Heal wound Date of Onset: (Unknown) Prior Treatments: foam dressings  Pain Score: Premedicated. Pt reporting moderate pain during treatment.   Wound Assessment  Pressure Injury 09/29/17 Unstageable - Full thickness tissue loss in which the base of the ulcer is covered by slough (yellow, tan, gray, green or brown) and/or eschar (tan, brown or black) in the wound bed. (Active)  Wound Image   09/30/2017  2:15 PM  Dressing Type ABD;Barrier Film (skin prep);Gauze (Comment);Moist to dry 09/30/2017  2:15 PM  Dressing Clean;Dry;Intact 09/30/2017  2:15 PM  Dressing Change Frequency Daily 09/30/2017  2:15 PM  State of Healing Non-healing 09/30/2017  2:15 PM  Site / Wound Assessment Yellow 09/30/2017  2:15 PM  % Wound base Red or Granulating 0% 09/30/2017  2:15 PM  % Wound base Yellow/Fibrinous Exudate 100% 09/30/2017  2:15 PM  % Wound base Black/Eschar 0% 09/30/2017  2:15 PM  % Wound base Other/Granulation Tissue (Comment) 0% 09/30/2017  2:15 PM  Peri-wound Assessment Intact;Pink 09/30/2017  2:15 PM  Wound Length (cm) 2.5 cm 09/29/2017  2:23 PM  Wound Width (cm) 1.2 cm 09/29/2017  2:23 PM  Wound Depth (cm) 0.2 cm 09/29/2017  2:23 PM  Wound Surface Area (cm^2) 3 cm^2 09/29/2017  2:23 PM  Wound Volume (cm^3) 0.6 cm^3 09/29/2017  2:23 PM  Tunneling (cm) 0 09/29/2017  2:23 PM  Undermining (cm) 0 09/29/2017  2:23 PM  Margins Unattached edges (unapproximated) 09/30/2017  2:15 PM  Drainage Amount Moderate 09/30/2017  2:15 PM  Drainage Description Serosanguineous 09/30/2017  2:15 PM  Treatment Debridement (Selective);Hydrotherapy (Pulse lavage);Packing (Saline gauze) 09/30/2017  2:15 PM  Santyl applied to wound bed prior to applying  dressing.   Hydrotherapy Pulsed lavage therapy - wound location: Sacrum Pulsed Lavage with Suction (psi): 12 psi Pulsed Lavage with Suction - Normal Saline Used: 1000 mL Pulsed Lavage Tip: Tip with splash shield Selective Debridement Selective Debridement - Location: Sacrum Selective Debridement - Tools Used: Scissors;Forceps Selective Debridement - Tissue Removed: Yellow unviable tissue   Wound Assessment and Plan  Wound Therapy - Assess/Plan/Recommendations Wound Therapy - Clinical Statement: Pt able to tolerate bedside debridement, and with removal of surface necrotic tissue, a 1.3 cm tunnel opened in the middle of the wound. Pt will benefit from continued hydrotherapy for further selective removal of unviable tissue, and to promote wound bed healing.  Wound Therapy - Functional Problem List: Decreased tolerance for OOB mobility due to pain Factors Delaying/Impairing Wound Healing: Altered sensation;Immobility;Diabetes Mellitus Hydrotherapy Plan: Debridement;Dressing change;Patient/family education;Pulsatile lavage with suction Wound Therapy - Frequency: 6X / week Wound Therapy - Follow Up Recommendations: Skilled nursing facility Wound Plan: See above  Wound Therapy Goals- Improve the function of patient's integumentary system by progressing the wound(s) through the phases of wound healing (inflammation - proliferation - remodeling) by: Decrease Necrotic Tissue to: 0% Decrease Necrotic Tissue - Progress: Progressing toward goal Increase Granulation Tissue to: 100% Increase Granulation Tissue - Progress: Progressing toward goal Goals/treatment plan/discharge plan were made with and agreed upon by patient/family: Yes Time For Goal Achievement: 7 days Wound Therapy - Potential for Goals: Good  Goals will be updated until maximal potential achieved or discharge criteria met.  Discharge criteria: when goals achieved, discharge from hospital, MD decision/surgical intervention, no  progress towards goals, refusal/missing three consecutive treatments without notification or medical reason.  GP     Thelma Comp 09/30/2017, 2:24 PM   Rolinda Roan, PT, DPT Acute Rehabilitation Services Pager: [phone]

## 2017-09-30 NOTE — Care Management Important Message (Signed)
Important Message  Patient Details  Name: Donna [name] MRN: [id number] Date of Birth: [birth year]   Medicare Important Message Given:  Yes    Adithya Difrancesco P [place] 09/30/2017, 3:30 PM

## 2017-09-30 NOTE — Care Management Note (Signed)
Case Management Note  Patient Details  Name: Donna [name] MRN: [id number] Date of Birth: 1950-10-28  Subjective/Objective:                A fib w RVR, wound care.    Action/Plan:  Will return to [facility] as facilitated by CSW.    Expected Discharge Date:                  Expected Discharge Plan:  Skilled Nursing Facility  In-House Referral:  Clinical Social Work  Discharge planning Services     Post Acute Care Choice:    Choice offered to:     DME Arranged:    DME Agency:     HH Arranged:    [place] Agency:     Status of Service:  Completed, signed off  If discussed at [employer], dates discussed:    Additional Comments:  Carles Collet, RN 09/30/2017, 9:39 AM

## 2017-09-30 NOTE — Progress Notes (Signed)
Report called to Lucie Leather, LPN at [facility]

## 2017-09-30 NOTE — Discharge Summary (Signed)
Physician Discharge Summary  Donna [name] NLZ:[id number] DOB: 1950-08-21 DOA: 09/25/2017  PCP: Janine Limbo, PA-C  Admit date: 09/25/2017 Discharge date: 09/30/2017  Admitted From: Nursing home  Disposition: Nursing home Recommendations for Outpatient Follow-up:  1. Follow up with PCP in 1-2 weeks 2. Please obtain BMP/CBC in one week 3. Follow-up with wound clinic or with in-house wound care physician.  Home Health none  Equipment/Devices none discharge Condition: Stable CODE STATUS: Full code Diet recommendation: Cardiac diet modified carb diet Brief/Interim Summary::66 y.o.femalewith medical history significant forobesity (BMI 40), chronic pain, depression with anxiety, atrial fibrillation on warfarin, and insulin-dependent diabetes mellitus, now presenting to the emergency department for evaluation of lower back pain where there is a small draining wound. Patientreports developing a small "pimple" on her sacrum that began to drain purulent material 4 days ago. She has had low-grade fever and increasing pain at the site. She denies chest pain, palpitations, headache, change in vision or hearing, or focal numbness or weakness. She denies any other sores or rash on admission. She was recently admitted with profuse watery diarrhea and volume depletion, was fluid resuscitated, and had her diuretics held.  ED Course:Upon arrival to the ED, patient is found tohave a temp of 37.8 C,saturating well on 2 L/min supplemental oxygen, tachycardic with rates in the 120s to 130s, and with low-normal blood pressures. EKG features atrial fibrillation with rate 123. Chest x-ray is limited by patient rotation but negative for acute cardiopulmonary disease. Chemistry panel is unremarkable. CBC is notable for mild leukocytosis and a normocytic anemia with hemoglobin 8.8. INR is 2.32 and urinalysis unremarkable. Blood cultures were collected, clindamycin administered, and the patient was also treated  with 500 cc normal saline, oral morphine x2, multiple doses of Lopressor without significant improvement in heart rate, and then started on diltiazem infusion. She remains tachycardic with stable blood pressure and will be admitted for ongoing evaluation and management.     Discharge Diagnoses:  Principal Problem:   Atrial fibrillation with RVR (HCC) Active Problems:   OSA (obstructive sleep apnea)   Depression   Anxiety   Cellulitis of sacral region   Normocytic anemia   Insulin-requiring or dependent type II diabetes mellitus (HCC)   Chronic pain   Decubitus ulcer of ischium, left, stage IV (HCC)  1] chronic atrial fibrillation with RVR-patient was initially treated with Cardizem drip this was later converted to p.o. Cardizem.  Her rate was controlled with Cardizem and metoprolol.  Continue Coumadin.  Last TSH was normal at 0.5 from 09/19/2017.  Electrolytes normal.   2] sacral decubitus patient seen by wound care team and surgery team-who recommends ongoing Santyl treatment may need debridement of the slough if there is no improvement with Santyl.  Patient to follow-up with wound care or consult with wound care physician in the nursing home.  Continue Santyl for now.  3] type 2 diabetes restart home medications.  4] CHF diastolic stable continue Lasix 40 mg daily Zaroxolyn has been stopped during this hospital stay.  5] anemia of chronic disease patient received 1 unit of prbc  6]arthritis with hip patient complained of left hip pain x-ray done shows just arthritis no evidence of fracture.     Discharge Instructions  Discharge Instructions    Call MD for:  difficulty breathing, headache or visual disturbances   Complete by:  As directed    Call MD for:  persistant dizziness or light-headedness   Complete by:  As directed    Call MD for:  persistant nausea and vomiting   Complete by:  As directed    Call MD for:  severe uncontrolled pain   Complete by:  As directed     Call MD for:  temperature >100.4   Complete by:  As directed    Diet - low sodium heart healthy   Complete by:  As directed    Increase activity slowly   Complete by:  As directed      Allergies as of 09/30/2017      Reactions   Bee Venom Hives   Takes benadryl   Fentanyl Other (See Comments)   Hallucinations, syncope     Metformin And Related Diarrhea   Other Other (See Comments)   Raw foods with seeds give her "boils".  Avoids raw strawberries, blueberries. Tolerates cooked fruits, tomato sauce, bread/grains with seeds. [facility] 10/17/13 Berries with seeds Allergy to glutamine-C-quercet-selen-brom per [facility] 09/25/17   Alteplase Rash   Cefepime Rash      Medication List    STOP taking these medications   metolazone 10 MG tablet Commonly known as:  ZAROXOLYN   PRESCRIPTION MEDICATION     TAKE these medications   acetaminophen 500 MG tablet Commonly known as:  TYLENOL Take 500 mg by mouth every 6 (six) hours as needed for mild pain.   ALPRAZolam 0.25 MG tablet Commonly known as:  XANAX Take 1 tablet (0.25 mg total) by mouth 3 (three) times daily.   busPIRone 10 MG tablet Commonly known as:  BUSPAR Take 10 mg by mouth daily.   collagenase ointment Commonly known as:  SANTYL Apply topically daily.   diltiazem 30 MG tablet Commonly known as:  CARDIZEM Take 1 tablet (30 mg total) by mouth every 6 (six) hours.   doxycycline 100 MG tablet Commonly known as:  VIBRA-TABS Take 1 tablet (100 mg total) by mouth every 12 (twelve) hours.   ezetimibe 10 MG tablet Commonly known as:  ZETIA Take 10 mg by mouth at bedtime.   ferrous sulfate 325 (65 FE) MG tablet Take 325 mg by mouth See admin instructions. Take one tablet (325 mg) by mouth on Sunday, Monday, Wednesday and Friday mornings.   furosemide 40 MG tablet Commonly known as:  LASIX Take 40 mg by mouth daily. What changed:  Another medication with the same name was removed. Continue taking this medication, and follow  the directions you see here.   gabapentin 600 MG tablet Commonly known as:  NEURONTIN Take 600 mg by mouth 3 (three) times daily.   guaiFENesin 600 MG 12 hr tablet Commonly known as:  MUCINEX Take 600 mg by mouth 2 (two) times daily as needed for cough or to loosen phlegm.   HUMALOG KWIKPEN 100 UNIT/ML KiwkPen Generic drug:  insulin lispro Inject 6-14 Units into the skin See admin instructions. Inject 6-14 units subcutaneously before each meal per sliding scale: CBG 201-250 6 units, 251-300 8 units, 301-350 10 units, 351-400 12 units, over 400 14 units and call MD   insulin detemir 100 UNIT/ML injection Commonly known as:  LEVEMIR Inject 0.4 mLs (40 Units total) into the skin at bedtime. What changed:  how much to take   lidocaine 5 % Commonly known as:  LIDODERM Place 1 patch onto the skin daily as needed (right hip pain). Remove & Discard patch within 12 hours or as directed by MD   magnesium oxide 400 MG tablet Commonly known as:  MAG-OX Take 400 mg by mouth 3 (three) times daily.   Melatonin  5 MG Caps Take 5 mg by mouth at bedtime.   metoprolol succinate 25 MG 24 hr tablet Commonly known as:  TOPROL-XL Take 1 tablet (25 mg total) by mouth daily. Take with or immediately following a meal.   miconazole 2 % powder Commonly known as:  MICOTIN Apply 1 application topically See admin instructions. Apply topically to abdominal folds twice daily   ondansetron 4 MG tablet Commonly known as:  ZOFRAN Take 1 tablet (4 mg total) by mouth every 6 (six) hours as needed for nausea.   OxyCODONE HCl (Abuse Deter) 5 MG Taba Commonly known as:  OXAYDO Take 1 tablet by mouth 3 (three) times daily as needed (pain). What changed:  reasons to take this   OXYGEN Inhale 2 L into the lungs as needed (to keep SATS greater than 90%).   potassium chloride SA 20 MEQ tablet Commonly known as:  K-DUR,KLOR-CON Take 1 tablet (20 mEq total) by mouth daily. Start taking on:  10/01/2017    pramipexole 0.125 MG tablet Commonly known as:  MIRAPEX Take 0.125 mg by mouth daily.   senna-docusate 8.6-50 MG tablet Commonly known as:  Senokot-S Take 1 tablet by mouth at bedtime as needed for mild constipation.   sertraline 100 MG tablet Commonly known as:  ZOLOFT Take 100 mg by mouth daily.   tiZANidine 4 MG tablet Commonly known as:  ZANAFLEX Take 4 mg by mouth 3 (three) times daily as needed for muscle spasms.   TRULICITY 1.5 ZT/2.4PY Sopn Generic drug:  Dulaglutide Inject 1.5 mg into the skin every Tuesday.   Turmeric 500 MG Caps Take 500 mg by mouth daily.   venlafaxine XR 150 MG 24 hr capsule Commonly known as:  EFFEXOR-XR Take 150 mg by mouth daily with breakfast.   Vitamin D 2000 units tablet Take 4,000 Units by mouth daily.   warfarin 10 MG tablet Commonly known as:  COUMADIN Take 10 mg by mouth See admin instructions. Take one tablet (10 mg) by mouth on Tuesday nights (take 7.5 mg tablet on all other nights of the week)   warfarin 7.5 MG tablet Commonly known as:  COUMADIN Take 7.5 mg by mouth See admin instructions. Take one tablet (7.5 mg) by mouth at bedtime except on Tuesdays (take 10 mg tablet on Tuesdays)       Allergies  Allergen Reactions  . Bee Venom Hives    Takes benadryl  . Fentanyl Other (See Comments)    Hallucinations, syncope    . Metformin And Related Diarrhea  . Other Other (See Comments)    Raw foods with seeds give her "boils".  Avoids raw strawberries, blueberries. Tolerates cooked fruits, tomato sauce, bread/grains with seeds. [facility] 10/17/13 Berries with seeds Allergy to glutamine-C-quercet-selen-brom per [facility] 09/25/17  . Alteplase Rash  . Cefepime Rash    Consultations: General surg  Procedures/Studies: Ct Abdomen Pelvis Wo Contrast  Result Date: 09/03/2017 CLINICAL DATA:  Diarrhea and recent syncopal episode EXAM: CT ABDOMEN AND PELVIS WITHOUT CONTRAST TECHNIQUE: Multidetector CT imaging of the abdomen and pelvis was  performed following the standard protocol without IV contrast. COMPARISON:  03/19/2015 FINDINGS: Lower chest: Lung bases are free of acute infiltrate or sizable effusion. Hepatobiliary: No focal liver abnormality is seen. Status post cholecystectomy. No biliary dilatation. Pancreas: Unremarkable. No pancreatic ductal dilatation or surrounding inflammatory changes. Spleen: Normal in size without focal abnormality. Adrenals/Urinary Tract: Adrenal glands are within normal limits. Renal cystic change is noted. No renal calculi or obstructive changes are seen. The  bladder is partially distended. Stomach/Bowel: Minimal diverticular change of the colon is noted. The appendix is not well visualized and may have been surgically removed. No inflammatory changes to suggest appendicitis are noted. Small bowel is within normal limits. Vascular/Lymphatic: Aortic atherosclerosis. No enlarged abdominal or pelvic lymph nodes. Reproductive: Status post hysterectomy. No adnexal masses. Other: No abdominal wall hernia or abnormality. No abdominopelvic ascites. Musculoskeletal: Right hip replacement is noted. Degenerative changes of the lumbar spine are seen. IMPRESSION: No acute abnormality identified. Electronically Signed   By: Inez Catalina M.D.   On: 09/03/2017 12:59   Dg Chest 1 View  Result Date: 09/27/2017 CLINICAL DATA:  Hypoxia. EXAM: CHEST  1 VIEW COMPARISON:  09/25/2017 FINDINGS: Cardiomegaly again identified. Mild chronic peribronchial thickening again noted. There is no evidence of focal airspace disease, pulmonary edema, suspicious pulmonary nodule/mass, pleural effusion, or pneumothorax. No acute bony abnormalities are identified. IMPRESSION: Cardiomegaly without evidence of acute cardiopulmonary disease. Electronically Signed   By: Margarette Canada M.D.   On: 09/27/2017 12:40   Dg Chest 2 View  Result Date: 09/25/2017 CLINICAL DATA:  Chest pain and fever. EXAM: CHEST - 2 VIEW COMPARISON:  September 03, 2017 FINDINGS: The  heart size is borderline to mildly enlarged. The hila and mediastinum are normal. No pneumothorax. No nodules or masses. Haziness over the left chest is likely due to patient rotation. No focal infiltrate or overt edema. IMPRESSION: Limited study with no acute abnormality noted. Electronically Signed   By: Dorise Bullion III M.D   On: 09/25/2017 19:23   Ct Head Wo Contrast  Result Date: 09/24/2017 CLINICAL DATA:  Headache and dizziness with syncope EXAM: CT HEAD WITHOUT CONTRAST TECHNIQUE: Contiguous axial images were obtained from the base of the skull through the vertex without intravenous contrast. COMPARISON:  September 03, 2017 FINDINGS: Brain: There is mild diffuse atrophy, stable. There is no intracranial mass, hemorrhage, extra-axial fluid collection, or midline shift. There is minimal small vessel disease in the centra semiovale bilaterally. Elsewhere gray-white compartments appear normal. No evident acute infarct. Vascular: No evident hyperdense vessel. There is mild calcification carotid siphon regions. Skull: The bony calvarium appears intact. Sinuses/Orbits: There is mucosal thickening in several ethmoid air cells. Other paranasal sinuses are clear. Orbits appear symmetric bilaterally. Other: Mastoid air cells are clear. IMPRESSION: Mild atrophy with minimal periventricular small vessel disease. No acute infarct. No mass or hemorrhage. There is mild arterial vascular calcification. There is mucosal thickening in several ethmoid air cells. Electronically Signed   By: Lowella Grip III M.D.   On: 09/24/2017 14:12   Ct Head Wo Contrast  Result Date: 09/03/2017 CLINICAL DATA:  Recent syncopal episode EXAM: CT HEAD WITHOUT CONTRAST TECHNIQUE: Contiguous axial images were obtained from the base of the skull through the vertex without intravenous contrast. COMPARISON:  01/17/2015 FINDINGS: Brain: No evidence of acute infarction, hemorrhage, hydrocephalus, extra-axial collection or mass lesion/mass  effect. Mild atrophic changes are noted. Vascular: No hyperdense vessel or unexpected calcification. Skull: Normal. Negative for fracture or focal lesion. Sinuses/Orbits: No acute finding. Other: None. IMPRESSION: Chronic atrophic changes without acute abnormality. Electronically Signed   By: Inez Catalina M.D.   On: 09/03/2017 12:56   Ct Pelvis W Contrast  Result Date: 09/28/2017 CLINICAL DATA:  Skin wound over the sacrum EXAM: CT PELVIS WITH CONTRAST TECHNIQUE: Multidetector CT imaging of the pelvis was performed using the standard protocol following the bolus administration of intravenous contrast. CONTRAST:  175mL OMNIPAQUE IOHEXOL 300 MG/ML  SOLN COMPARISON:  Multiple  CTs dating back to 11/17/2009 FINDINGS: Urinary Tract: Bladder is partially distended. No obstructive changes are seen. Bowel: Scattered diverticular change of the colon is noted. No obstructive or inflammatory changes are seen. Appendix is not well visualized. Vascular/Lymphatic: No significant vascular or lymphatic abnormality is noted. Reproductive:  The uterus has been surgically removed. Other: No free fluid is noted. No hernia is seen. There is stable soft tissue density anterior to the sacrum unchanged dating back to 2011 Musculoskeletal: Right hip replacement is noted. Degenerative changes of lumbar spine are seen. Mild soft tissue edematous changes are noted in the area of clinical concern overlying the sacrum. No underlying abscess is seen. IMPRESSION: Soft tissue inflammatory changes consistent with the clinical history over the sacrum although no focal deep abscess is noted. Chronic changes stable from the prior exam. Electronically Signed   By: Inez Catalina M.D.   On: 09/28/2017 13:23   Dg Pelvis Portable  Result Date: 09/03/2017 CLINICAL DATA:  67 y/o F; multiple syncopal episodes with fall. Right hip pain and knee pain. EXAM: PORTABLE PELVIS 1-2 VIEWS COMPARISON:  03/19/2015 CT abdomen and pelvis. FINDINGS: There is no  evidence of pelvic fracture or diastasis. No pelvic bone lesions are seen. Right total hip arthroplasty partially visualized. IMPRESSION: No acute fracture or dislocation identified. Electronically Signed   By: Kristine Garbe M.D.   On: 09/03/2017 04:37   Ct Hip Right Wo Contrast  Result Date: 09/06/2017 CLINICAL DATA:  Right hip pain since a fall 09/03/2017. Initial encounter. EXAM: CT OF THE RIGHT HIP WITHOUT CONTRAST TECHNIQUE: Multidetector CT imaging of the right hip was performed according to the standard protocol. Multiplanar CT image reconstructions were also generated. COMPARISON:  Plain films right hip 09/05/2017. CT abdomen and pelvis 03/19/2015. FINDINGS: Bones/Joint/Cartilage Right hip arthroplasty is in place. The device is located. No fracture is identified. Heterotopic ossification is seen about the head and neck of the hip and is unchanged since the prior CT. No focal bone lesion is identified. Degenerative change is seen about the right SI joint. Ligaments Suboptimally assessed by CT. Muscles and Tendons Appear intact. Chronic ossification of the iliopsoas tendon is unchanged since the prior CT. Soft tissues Imaged intrapelvic contents demonstrate no acute abnormality. Atherosclerosis is noted. IMPRESSION: No acute abnormality. Status post right hip replacement. Heterotopic ossification about the device is chronic. Electronically Signed   By: Inge Rise M.D.   On: 09/06/2017 17:30   Dg Chest Portable 1 View  Result Date: 09/03/2017 CLINICAL DATA:  Multiple syncopal episodes with orthostatic changes. Her fall. EXAM: PORTABLE CHEST 1 VIEW COMPARISON:  11/06/2015 FINDINGS: Heart size and pulmonary vascularity are normal for technique. Lungs are clear and expanded. No blunting of costophrenic angles. No pneumothorax. Mediastinal contours appear intact. Calcification of the aorta. IMPRESSION: No active disease. Electronically Signed   By: Lucienne Capers M.D.   On: 09/03/2017  04:40   Dg Knee Left Port  Result Date: 09/03/2017 CLINICAL DATA:  67 y/o F; multiple syncopal episodes and fall with knee pain. EXAM: PORTABLE LEFT KNEE - 1-2 VIEW COMPARISON:  None. FINDINGS: No evidence of fracture, dislocation, or joint effusion. Moderate narrowing of the medial femorotibial compartment with sclerosis of articular surfaces. Tricompartmental osteophytosis. Vascular calcifications. Bones are demineralized. IMPRESSION: 1.  No acute fracture or dislocation identified. 2. Moderate tricompartmental osteoarthrosis greatest in the medial femorotibial compartment. Electronically Signed   By: Kristine Garbe M.D.   On: 09/03/2017 04:43   Dg Hip Unilat With Pelvis 2-3 Views Left  Result Date: 09/27/2017 CLINICAL DATA:  LEFT hip pain. EXAM: DG HIP (WITH OR WITHOUT PELVIS) 2-3V LEFT COMPARISON:  09/05/2017 radiographs FINDINGS: Mild degenerative changes in the LEFT hip noted. No acute fracture or dislocation identified. RIGHT hip arthroplasty changes again identified. No suspicious focal bony lesions are noted. IMPRESSION: No acute LEFT hip abnormality. Mild degenerative changes in the LEFT hip. Electronically Signed   By: Margarette Canada M.D.   On: 09/27/2017 12:41   Dg Hip Unilat With Pelvis 2-3 Views Right  Result Date: 09/05/2017 CLINICAL DATA:  Right pain after fall. EXAM: DG HIP (WITH OR WITHOUT PELVIS) 2-3V RIGHT COMPARISON:  CT of the abdomen and pelvis and pelvis x-ray September 03, 2017. CT the abdomen and pelvis March 19, 2015. FINDINGS: Patient is status post right hip replacement. Femoral and acetabular components are in good position. Dystrophic calcifications in the soft tissues around the right hip are stable since 2017. No definitive acute fracture identified. Irregularity of the greater trochanter is stable. Mild lucency at the base of the greater trochanter on the third image extends outside the bone and is thought to be in the soft tissues. Visualized pelvic bones are normal.  IMPRESSION: 1. No convincing evidence of hip fracture. If concern persists, CT imaging would be more sensitive. Electronically Signed   By: Dorise Bullion III M.D   On: 09/05/2017 11:11    (Echo, Carotid, EGD, Colonoscopy, ERCP)    Subjective:   Discharge Exam: Vitals:   09/30/17 0524 09/30/17 0945  BP: 124/70 126/79  Pulse:  (!) 102  Resp:    Temp:    SpO2:     Vitals:   09/30/17 0006 09/30/17 0404 09/30/17 0524 09/30/17 0945  BP: 126/73 (!) 128/58 124/70 126/79  Pulse: 90 90  (!) 102  Resp: 15 16    Temp: 98 F (36.7 C) 97.8 F (36.6 C)    TempSrc: Oral Oral    SpO2: 96% 96%    Weight:      Height:        General: Pt is alert, awake, not in acute distress Cardiovascular: RRR, S1/S2 +, no rubs, no gallops Respiratory: CTA bilaterally, no wheezing, no rhonchi Abdominal: Soft, NT, ND, bowel sounds + Extremities: no edema, no cyanosis    The results of significant diagnostics from this hospitalization (including imaging, microbiology, ancillary and laboratory) are listed below for reference.     Microbiology: Recent Results (from the past 240 hour(s))  Blood culture (routine x 2)     Status: None (Preliminary result)   Collection Time: 09/25/17  6:10 PM  Result Value Ref Range Status   Specimen Description BLOOD RIGHT ANTECUBITAL  Final   Special Requests   Final    BOTTLES DRAWN AEROBIC ONLY Blood Culture results may not be optimal due to an inadequate volume of blood received in culture bottles   Culture   Final    NO GROWTH 4 DAYS Performed at [facility], [place] [address], [place], [place] [address]    Report Status PENDING  Incomplete  Blood culture (routine x 2)     Status: None (Preliminary result)   Collection Time: 09/25/17  6:25 PM  Result Value Ref Range Status   Specimen Description BLOOD LEFT HAND  Final   Special Requests   Final    BOTTLES DRAWN AEROBIC AND ANAEROBIC Blood Culture results may not be optimal due to an inadequate volume of  blood received in culture bottles   Culture  Final    NO GROWTH 4 DAYS Performed at [facility], [place] [address], [place], [place] [address]    Report Status PENDING  Incomplete  MRSA PCR Screening     Status: None   Collection Time: 09/28/17 11:06 PM  Result Value Ref Range Status   MRSA by PCR NEGATIVE NEGATIVE Final    Comment:        The GeneXpert MRSA Assay (FDA approved for NASAL specimens only), is one component of a comprehensive MRSA colonization surveillance program. It is not intended to diagnose MRSA infection nor to guide or monitor treatment for MRSA infections. Performed at [facility], [place] [address] [place], [place] [address]      Labs: BNP (last 3 results) Recent Labs    09/03/17 0930  BNP 876.8*   Basic Metabolic Panel: Recent Labs  Lab 09/25/17 1828 09/26/17 0354 09/27/17 0429 09/29/17 0403 09/30/17 0409  NA 136 136 138 139 142  K 3.5 3.4* 4.1 4.2 4.7  CL 99 100 99 102 103  CO2 26 25 30 29 29   GLUCOSE 115* 147* 169* 165* 118*  BUN 11 12 9 10 8   CREATININE 0.94 0.92 0.80 0.81 0.69  CALCIUM 8.8* 8.4* 8.9 8.9 9.3  MG 2.0  --  2.1  --   --    Liver Function Tests: No results for input(s): AST, ALT, ALKPHOS, BILITOT, PROT, ALBUMIN in the last 168 hours. No results for input(s): LIPASE, AMYLASE in the last 168 hours. No results for input(s): AMMONIA in the last 168 hours. CBC: Recent Labs  Lab 09/25/17 1828 09/26/17 0354 09/27/17 0429 09/28/17 0336 09/29/17 0403 09/30/17 0409  WBC 10.7* 8.2 6.2 6.2 6.7 5.9  NEUTROABS 8.2* 5.5  --   --   --   --   HGB 8.8* 7.9* 7.9* 7.8* 7.8* 8.5*  HCT 30.8* 28.1* 28.3* 27.7* 27.7* 29.9*  MCV 86.3 85.7 85.8 87.1 86.6 86.9  PLT 212 206 184 202 208 220   Cardiac Enzymes: No results for input(s): CKTOTAL, CKMB, CKMBINDEX, TROPONINI in the last 168 hours. BNP: Invalid input(s): POCBNP CBG: Recent Labs  Lab 09/29/17 0625 09/29/17 1148 09/29/17 1612 09/29/17 2108 09/30/17 0600   GLUCAP 182* 148* 158* 195* 118*   D-Dimer No results for input(s): DDIMER in the last 72 hours. Hgb A1c No results for input(s): HGBA1C in the last 72 hours. Lipid Profile No results for input(s): CHOL, HDL, LDLCALC, TRIG, CHOLHDL, LDLDIRECT in the last 72 hours. Thyroid function studies No results for input(s): TSH, T4TOTAL, T3FREE, THYROIDAB in the last 72 hours.  Invalid input(s): FREET3 Anemia work up No results for input(s): VITAMINB12, FOLATE, FERRITIN, TIBC, IRON, RETICCTPCT in the last 72 hours. Urinalysis    Component Value Date/Time   COLORURINE YELLOW 09/25/2017 1945   APPEARANCEUR CLEAR 09/25/2017 1945   LABSPEC 1.015 09/25/2017 1945   PHURINE 5.0 09/25/2017 1945   GLUCOSEU NEGATIVE 09/25/2017 1945   HGBUR NEGATIVE 09/25/2017 1945   BILIRUBINUR NEGATIVE 09/25/2017 1945   KETONESUR NEGATIVE 09/25/2017 1945   PROTEINUR NEGATIVE 09/25/2017 1945   UROBILINOGEN 1.0 09/06/2010 1130   NITRITE NEGATIVE 09/25/2017 1945   LEUKOCYTESUR TRACE (A) 09/25/2017 1945   Sepsis Labs Invalid input(s): PROCALCITONIN,  WBC,  LACTICIDVEN Microbiology Recent Results (from the past 240 hour(s))  Blood culture (routine x 2)     Status: None (Preliminary result)   Collection Time: 09/25/17  6:10 PM  Result Value Ref Range Status   Specimen Description BLOOD RIGHT  ANTECUBITAL  Final   Special Requests   Final    BOTTLES DRAWN AEROBIC ONLY Blood Culture results may not be optimal due to an inadequate volume of blood received in culture bottles   Culture   Final    NO GROWTH 4 DAYS Performed at [facility], [place] [address] [place], [place] [address]    Report Status PENDING  Incomplete  Blood culture (routine x 2)     Status: None (Preliminary result)   Collection Time: 09/25/17  6:25 PM  Result Value Ref Range Status   Specimen Description BLOOD LEFT HAND  Final   Special Requests   Final    BOTTLES DRAWN AEROBIC AND ANAEROBIC Blood Culture results may not be optimal due to  an inadequate volume of blood received in culture bottles   Culture   Final    NO GROWTH 4 DAYS Performed at [facility], [place] [address] [place], [place] [address]    Report Status PENDING  Incomplete  MRSA PCR Screening     Status: None   Collection Time: 09/28/17 11:06 PM  Result Value Ref Range Status   MRSA by PCR NEGATIVE NEGATIVE Final    Comment:        The GeneXpert MRSA Assay (FDA approved for NASAL specimens only), is one component of a comprehensive MRSA colonization surveillance program. It is not intended to diagnose MRSA infection nor to guide or monitor treatment for MRSA infections. Performed at [facility], [place] [address], [place], [place] [address]      Time coordinating discharge: 35 minutes  SIGNED:   Georgette Shell, MD  [facility] 09/30/2017, 10:53 AM Pager   If 7PM-7AM, please contact night-coverage [email or web address] Password [email or web address]

## 2017-09-30 NOTE — Clinical Social Work Note (Signed)
Clinical Social Work Assessment  Patient Details  Name: Donna [name] MRN: [id number] Date of Birth: January 29, 1951  Date of referral:  09/30/17               Reason for consult:  Discharge Planning, Facility Placement                Permission sought to share information with:  Family Supports Permission granted to share information::  Yes, Verbal Permission Granted  Name::     [name]  Agency::  snf  Relationship::  [phone]  Contact Information:  spouse  Housing/Transportation Living arrangements for the past 2 months:  [place] of Information:  Patient Patient Interpreter Needed:  None Criminal Activity/Legal Involvement Pertinent to Current Situation/Hospitalization:  No - Comment as needed Significant Relationships:  Other Family Members, Spouse, Adult Children Lives with:  Spouse Do you feel safe going back to the place where you live?  Yes Need for family participation in patient care:  Yes (Comment)  Care giving concerns:  No family at bedside. Patient stated she was at [facility] and would like to return back to finish with rehab. Patient stated she likes the facility and is hoping they can help her with her mobility.   Social Worker assessment / plan: Patient agreeable to discharge back to facility. Patient stated that her family is aware of her decision to return to rehab and they support it.  CSW spoke with admission coordinator at [facility] to verify that they will be able to take patient back. Facility stated that once patient is medically ready they will take her back to rehab.  Employment status:  Retired Forensic scientist:  [employer] PT Recommendations:  [place] / Referral to community resources:  [place]  Patient/Family's Response to care:  Patient appreciate CSW role in care  Patient/Family's Understanding of and Emotional Response to Diagnosis, Current Treatment, and  Prognosis:  Patient to return back to [facility] once medically cleared by MD  Emotional Assessment Appearance:  Appears stated age Attitude/Demeanor/Rapport:  Other Affect (typically observed):  Accepting Orientation:  Oriented to Self, Oriented to Situation, Oriented to Place, Oriented to  Time Alcohol / Substance use:  Not Applicable Psych involvement (Current and /or in the community):  No (Comment)  Discharge Needs  Concerns to be addressed:  Care Coordination Readmission within the last 30 days:  No Current discharge risk:  Dependent with Mobility Barriers to Discharge:  Continued Medical Work up   [employer], LCSW 09/30/2017, 10:19 AM

## 2017-09-30 NOTE — Progress Notes (Signed)
[facility] Progress Note     Subjective: CC: wound Patient tolerated hydrotherapy yesterday. Feels like buttock is less tender today. Tolerating diet, had a BM yesterday.   Objective: Vital signs in last 24 hours: Temp:  [97.3 F (36.3 C)-98 F (36.7 C)] 97.8 F (36.6 C) (07/31 0404) Pulse Rate:  [58-104] 90 (07/31 0404) Resp:  [14-23] 16 (07/31 0404) BP: (107-128)/(53-73) 124/70 (07/31 0524) SpO2:  [93 %-96 %] 96 % (07/31 0404) Last BM Date: 09/25/17  Intake/Output from previous day: 07/30 0701 - 07/31 0700 In: 1058 [P.O.:720; Blood:338] Out: 2750 [Urine:2750] Intake/Output this shift: No intake/output data recorded.  PE: Gen:  Alert, NAD, pleasant Card:  Regular rate and rhythm, pedal pulses 2+ BL Pulm: tachypneic, nasal cannula present Abd: Soft, non-tender, non-distended Sacral wound:  Sacral wound with necrotic slough, erythema seems slightly improved, moderate drainage Psych: A&Ox3   Lab Results:  Recent Labs    09/29/17 0403 09/30/17 0409  WBC 6.7 5.9  HGB 7.8* 8.5*  HCT 27.7* 29.9*  PLT 208 220   BMET Recent Labs    09/29/17 0403 09/30/17 0409  NA 139 142  K 4.2 4.7  CL 102 103  CO2 29 29  GLUCOSE 165* 118*  BUN 10 8  CREATININE 0.81 0.69  CALCIUM 8.9 9.3   PT/INR Recent Labs    09/29/17 0403 09/30/17 0409  LABPROT 26.6* 28.3*  INR 2.47 2.68   CMP     Component Value Date/Time   NA 142 09/30/2017 0409   NA 139 09/23/2017 1237   K 4.7 09/30/2017 0409   CL 103 09/30/2017 0409   CO2 29 09/30/2017 0409   GLUCOSE 118 (H) 09/30/2017 0409   BUN 8 09/30/2017 0409   BUN 14 09/23/2017 1237   CREATININE 0.69 09/30/2017 0409   CREATININE 1.36 (H) 03/24/2016 1110   CALCIUM 9.3 09/30/2017 0409   PROT 5.9 (L) 09/07/2017 0332   ALBUMIN 2.5 (L) 09/07/2017 0332   AST 17 09/07/2017 0332   ALT 12 09/07/2017 0332   ALKPHOS 65 09/07/2017 0332   BILITOT 0.6 09/07/2017 0332   GFRNONAA >60 09/30/2017 0409   GFRAA >60 09/30/2017  0409   Lipase  No results found for: LIPASE     Studies/Results: Ct Pelvis W Contrast  Result Date: 09/28/2017 CLINICAL DATA:  Skin wound over the sacrum EXAM: CT PELVIS WITH CONTRAST TECHNIQUE: Multidetector CT imaging of the pelvis was performed using the standard protocol following the bolus administration of intravenous contrast. CONTRAST:  110mL OMNIPAQUE IOHEXOL 300 MG/ML  SOLN COMPARISON:  Multiple CTs dating back to 11/17/2009 FINDINGS: Urinary Tract: Bladder is partially distended. No obstructive changes are seen. Bowel: Scattered diverticular change of the colon is noted. No obstructive or inflammatory changes are seen. Appendix is not well visualized. Vascular/Lymphatic: No significant vascular or lymphatic abnormality is noted. Reproductive:  The uterus has been surgically removed. Other: No free fluid is noted. No hernia is seen. There is stable soft tissue density anterior to the sacrum unchanged dating back to 2011 Musculoskeletal: Right hip replacement is noted. Degenerative changes of lumbar spine are seen. Mild soft tissue edematous changes are noted in the area of clinical concern overlying the sacrum. No underlying abscess is seen. IMPRESSION: Soft tissue inflammatory changes consistent with the clinical history over the sacrum although no focal deep abscess is noted. Chronic changes stable from the prior exam. Electronically Signed   By: Inez Catalina M.D.   On: 09/28/2017 13:23    Anti-infectives: Anti-infectives (  From admission, onward)   Start     Dose/Rate Route Frequency Ordered Stop   09/25/17 2330  doxycycline (VIBRA-TABS) tablet 100 mg     100 mg Oral Every 12 hours 09/25/17 2316     09/25/17 2145  clindamycin (CLEOCIN) capsule 600 mg     600 mg Oral  Once 09/25/17 2130 09/25/17 2149   09/25/17 2100  sulfamethoxazole-trimethoprim (BACTRIM DS,SEPTRA DS) 800-160 MG per tablet 2 tablet  Status:  Discontinued     2 tablet Oral  Once 09/25/17 2053 09/25/17 2053        Assessment/Plan Chronic atrial fibrillation with rapid ventricular rate-on Coumadin INR 2.68 Morbid obesity with BMI of 41.3 Wheelchair/bedbound secondary to osteoarthritis Obstructive sleep apnea with CPAP/chronic shortness of breath Type 2 diabetes insulin-dependent Diabetic lumbosacral plexopathy/fibromyalgia Diastolic congestive heart failure,NYHA class II Major depressive disorder/anxiety disorder Restless leg syndrome  History of syncope and fall with nonhealing left buttocks decubitus type ulcer, unstageable - CT 7/29: Soft tissue inflammatory changes consistent with the clinical history over the sacrum although no focal deep abscess is noted. - patient not very mobile - patient is unlikely to heal with surgical debridement - continue hydrotherapy/santyl - hopefully will improve with more conservative measures  FEN: HH/CM diet VTE: SCDs, coumadin  ID: bactrim/clindamycin 7/26; PO doxycycline 7/26>> Foley: wick     LOS: 5 days    Brigid Re , [facility] 09/30/2017, 7:42 AM Pager: [phone] Consults: [phone] Mon-Fri 7:00 am-4:30 pm Sat-Sun 7:00 am-11:30 am

## 2017-09-30 NOTE — Progress Notes (Signed)
ANTICOAGULATION CONSULT NOTE - Follow up [facility] for coumadin Indication: atrial fibrillation  Allergies  Allergen Reactions  . Bee Venom Hives    Takes benadryl  . Fentanyl Other (See Comments)    Hallucinations, syncope    . Metformin And Related Diarrhea  . Other Other (See Comments)    Raw foods with seeds give her "boils".  Avoids raw strawberries, blueberries. Tolerates cooked fruits, tomato sauce, bread/grains with seeds. [facility] 10/17/13 Berries with seeds Allergy to glutamine-C-quercet-selen-brom per [facility] 09/25/17  . Alteplase Rash  . Cefepime Rash   Patient Measurements: Height: 5\' 10"  (177.8 cm) Weight: 288 lb 2.3 oz (130.7 kg)(patient re-weighed) IBW/kg (Calculated) : 68.5  Vital Signs: Temp: 97.8 F (36.6 C) (07/31 0404) Temp Source: Oral (07/31 0404) BP: 124/70 (07/31 0524) Pulse Rate: 90 (07/31 0404)  Labs: Recent Labs    09/28/17 0336 09/29/17 0403 09/30/17 0409  HGB 7.8* 7.8* 8.5*  HCT 27.7* 27.7* 29.9*  PLT 202 208 220  LABPROT 24.5* 26.6* 28.3*  INR 2.23 2.47 2.68  CREATININE  --  0.81 0.69   Estimated Creatinine Clearance: 102 mL/min (by C-G formula based on SCr of 0.69 mg/dL).  Assessment: 67 yo morbidly obese female presenting with lower back pain. PMH includes DM, obesity, chronic pain, depression, anxiety, a fib.   INR on admission therapeutic. Pharmacy consulted to dose warfarin for atrial fibrillation.  Platelets stable and no noted bleeding complications.    HOME REGIMEN:  Warfarin 7.5 mg daily except 10 on Tuesday PTA.     INR therapeutic at 2.68 (goal 2-3)  Goal of Therapy:  INR 2-3 Monitor platelets by anticoagulation protocol: Yes   Plan:  Warfarin 7.5 mg po x 1 tonight (per home dose) Monitor daily INR, CBC, and s/s of bleeding   Thank you Alanda Slim, PharmD  09/30/2017   8:32 AM

## 2017-10-01 ENCOUNTER — Telehealth: Payer: Self-pay | Admitting: Cardiology

## 2017-10-01 ENCOUNTER — Other Ambulatory Visit: Payer: Self-pay | Admitting: Cardiology

## 2017-10-01 ENCOUNTER — Ambulatory Visit: Payer: Medicare Other | Admitting: Physician Assistant

## 2017-10-01 LAB — TYPE AND SCREEN
ABO/RH(D): O POS
Antibody Screen: POSITIVE
DONOR AG TYPE: NEGATIVE
DONOR AG TYPE: NEGATIVE
Unit division: 0
Unit division: 0
Unit division: 0
Unit division: 0
Unit division: 0

## 2017-10-01 LAB — BPAM RBC
BLOOD PRODUCT EXPIRATION DATE: 201908282359
BLOOD PRODUCT EXPIRATION DATE: 201908302359
Blood Product Expiration Date: 201908302359
Blood Product Expiration Date: 201909032359
Blood Product Expiration Date: 201909032359
ISSUE DATE / TIME: 201907301454
UNIT TYPE AND RH: 5100
Unit Type and Rh: 5100
Unit Type and Rh: 5100
Unit Type and Rh: 5100
Unit Type and Rh: 5100

## 2017-10-01 NOTE — Telephone Encounter (Signed)
Attempted to call pt. Unable to leave message. Phone continue to ring.

## 2017-10-01 NOTE — Telephone Encounter (Signed)
Rx request sent to pharmacy.  

## 2017-10-01 NOTE — Telephone Encounter (Signed)
New problem   Pt stated she is returning call to doctor.

## 2017-10-02 NOTE — Telephone Encounter (Signed)
Attempted to contact patient, no answer Phone rang x 8 times with no voicemail.

## 2017-10-04 DIAGNOSIS — I10 Essential (primary) hypertension: Secondary | ICD-10-CM | POA: Diagnosis not present

## 2017-10-04 DIAGNOSIS — M25562 Pain in left knee: Secondary | ICD-10-CM | POA: Diagnosis not present

## 2017-10-04 DIAGNOSIS — E1140 Type 2 diabetes mellitus with diabetic neuropathy, unspecified: Secondary | ICD-10-CM | POA: Diagnosis not present

## 2017-10-04 DIAGNOSIS — G4733 Obstructive sleep apnea (adult) (pediatric): Secondary | ICD-10-CM | POA: Diagnosis not present

## 2017-10-04 DIAGNOSIS — I482 Chronic atrial fibrillation: Secondary | ICD-10-CM | POA: Diagnosis not present

## 2017-10-04 DIAGNOSIS — L89159 Pressure ulcer of sacral region, unspecified stage: Secondary | ICD-10-CM | POA: Diagnosis not present

## 2017-10-04 DIAGNOSIS — R2689 Other abnormalities of gait and mobility: Secondary | ICD-10-CM | POA: Diagnosis not present

## 2017-10-05 ENCOUNTER — Other Ambulatory Visit (HOSPITAL_COMMUNITY): Payer: Self-pay | Admitting: Internal Medicine

## 2017-10-05 DIAGNOSIS — M25562 Pain in left knee: Secondary | ICD-10-CM

## 2017-10-06 DIAGNOSIS — L988 Other specified disorders of the skin and subcutaneous tissue: Secondary | ICD-10-CM | POA: Diagnosis not present

## 2017-10-09 ENCOUNTER — Ambulatory Visit (HOSPITAL_COMMUNITY)
Admission: RE | Admit: 2017-10-09 | Discharge: 2017-10-09 | Disposition: A | Discharge status: Home / Self Care | Payer: Medicare Other | Source: Ambulatory Visit | Attending: Internal Medicine | Admitting: Internal Medicine

## 2017-10-12 ENCOUNTER — Ambulatory Visit (HOSPITAL_COMMUNITY)
Admission: RE | Admit: 2017-10-12 | Discharge: 2017-10-12 | Disposition: A | Discharge status: Home / Self Care | Payer: Medicare Other | Source: Ambulatory Visit | Attending: Internal Medicine | Admitting: Internal Medicine

## 2017-10-12 DIAGNOSIS — M25562 Pain in left knee: Secondary | ICD-10-CM | POA: Insufficient documentation

## 2017-10-12 DIAGNOSIS — M1712 Unilateral primary osteoarthritis, left knee: Secondary | ICD-10-CM | POA: Diagnosis not present

## 2017-10-12 MED ORDER — BUPIVACAINE HCL (PF) 0.25 % IJ SOLN
10.0000 mL | Freq: Once | INTRAMUSCULAR | Status: AC
  Administered 2017-10-12: 10 mL via INTRA_ARTICULAR

## 2017-10-12 MED ORDER — LIDOCAINE HCL (PF) 1 % IJ SOLN
5.0000 mL | Freq: Once | INTRAMUSCULAR | Status: AC
  Administered 2017-10-12: 5 mL via INTRADERMAL

## 2017-10-12 MED ORDER — METHYLPREDNISOLONE ACETATE 80 MG/ML IJ SUSP
INTRAMUSCULAR | Status: AC
  Filled 2017-10-12: qty 1

## 2017-10-12 MED ORDER — IOPAMIDOL (ISOVUE-M 200) INJECTION 41%
INTRAMUSCULAR | Status: AC
  Filled 2017-10-12: qty 10

## 2017-10-12 MED ORDER — BUPIVACAINE HCL (PF) 0.25 % IJ SOLN
INTRAMUSCULAR | Status: AC
  Filled 2017-10-12: qty 30

## 2017-10-12 MED ORDER — METHYLPREDNISOLONE SODIUM SUCC 125 MG IJ SOLR
INTRAMUSCULAR | Status: AC
  Filled 2017-10-12: qty 2

## 2017-10-12 MED ORDER — LIDOCAINE HCL (PF) 1 % IJ SOLN
INTRAMUSCULAR | Status: AC
  Filled 2017-10-12: qty 5

## 2017-10-12 MED ORDER — IOPAMIDOL (ISOVUE-M 200) INJECTION 41%
10.0000 mL | Freq: Once | INTRAMUSCULAR | Status: AC
  Administered 2017-10-12: 10 mL via INTRA_ARTICULAR

## 2017-10-12 MED ORDER — METHYLPREDNISOLONE ACETATE 40 MG/ML IJ SUSP
INTRAMUSCULAR | Status: AC
  Filled 2017-10-12: qty 1

## 2017-10-12 MED ORDER — METHYLPREDNISOLONE ACETATE 40 MG/ML INJ SUSP (RADIOLOG
120.0000 mg | Freq: Once | INTRAMUSCULAR | Status: AC
  Administered 2017-10-12: 120 mg via INTRA_ARTICULAR

## 2017-10-13 DIAGNOSIS — L988 Other specified disorders of the skin and subcutaneous tissue: Secondary | ICD-10-CM | POA: Diagnosis not present

## 2017-10-20 ENCOUNTER — Ambulatory Visit (INDEPENDENT_AMBULATORY_CARE_PROVIDER_SITE_OTHER): Payer: Medicare Other | Admitting: Cardiology

## 2017-10-20 ENCOUNTER — Encounter: Payer: Self-pay | Admitting: Cardiology

## 2017-10-20 VITALS — BP 116/68 | HR 96 | Ht 70.0 in

## 2017-10-20 DIAGNOSIS — R600 Localized edema: Secondary | ICD-10-CM

## 2017-10-20 DIAGNOSIS — D649 Anemia, unspecified: Secondary | ICD-10-CM | POA: Diagnosis not present

## 2017-10-20 DIAGNOSIS — G4733 Obstructive sleep apnea (adult) (pediatric): Secondary | ICD-10-CM

## 2017-10-20 DIAGNOSIS — I10 Essential (primary) hypertension: Secondary | ICD-10-CM

## 2017-10-20 DIAGNOSIS — I482 Chronic atrial fibrillation, unspecified: Secondary | ICD-10-CM

## 2017-10-20 DIAGNOSIS — I878 Other specified disorders of veins: Secondary | ICD-10-CM

## 2017-10-20 DIAGNOSIS — L988 Other specified disorders of the skin and subcutaneous tissue: Secondary | ICD-10-CM | POA: Diagnosis not present

## 2017-10-20 MED ORDER — METOLAZONE 2.5 MG PO TABS: ORAL_TABLET | ORAL | 1 refills | Status: DC

## 2017-10-20 NOTE — Patient Instructions (Addendum)
Medication Instructions:  Start Metolazone 2.5mg  for next two days, then switch to taking every Sunday and Wednesday.  If you need a refill on your cardiac medications before your next appointment, please call your pharmacy.  Labwork: BMP, CBC HERE IN OUR OFFICE AT [facility]  Testing/Procedures: None Ordered.  Special Instructions: None Ordered.   Follow-Up: Your physician wants you to follow-up in: December with Dr.Harding.    Thank you for choosing [facility] at [facility]!!

## 2017-10-20 NOTE — Progress Notes (Addendum)
10/20/2017 Donna [name]   February 09, 1951  [id number]  Primary Physician O'Buch, Alvis Lemmings, PA-C Primary Cardiologist: Dr Ellyn Hack  HPI:   Morbidly obese Caucasian female with a history of chronic LE edema, negtaive cath and echo 2017, CAF on Warfarin, DM, fibromyalgia, and hypotension. She saw Octaviano Batty 09/23/17 in follow up from an admission for dehydration and syncope. The pt told me her weight was up "30 lbs" but on review of her weight going back 12 months its more like 12 lbs. Her main complain is dry cough and LE edema. Isaac Laud had resumed her Lasix at a lower dose.    Current Outpatient Medications  Medication Sig Dispense Refill  . acetaminophen (TYLENOL) 500 MG tablet Take 500 mg by mouth every 6 (six) hours as needed for mild pain.     Marland Kitchen ALPRAZolam (XANAX) 0.25 MG tablet Take 1 tablet (0.25 mg total) by mouth 3 (three) times daily. 10 tablet 0  . busPIRone (BUSPAR) 10 MG tablet Take 10 mg by mouth daily.     . Cholecalciferol (VITAMIN D) 2000 units tablet Take 4,000 Units by mouth daily.     . collagenase (SANTYL) ointment Apply topically daily. 15 g 0  . diltiazem (CARDIZEM) 30 MG tablet Take 1 tablet (30 mg total) by mouth every 6 (six) hours. 90 tablet 1  . doxycycline (VIBRA-TABS) 100 MG tablet Take 1 tablet (100 mg total) by mouth every 12 (twelve) hours. 10 tablet 0  . Dulaglutide (TRULICITY) 1.5 MO/2.9UT SOPN Inject 1.5 mg into the skin every Tuesday.    . ezetimibe (ZETIA) 10 MG tablet Take 10 mg by mouth at bedtime.     . ferrous sulfate 325 (65 FE) MG tablet Take 325 mg by mouth See admin instructions. Take one tablet (325 mg) by mouth on Sunday, Monday, Wednesday and Friday mornings.    . furosemide (LASIX) 40 MG tablet Take 40 mg by mouth daily.    . furosemide (LASIX) 80 MG tablet TAKE 1 TABLET (80 MG TOTAL) BY MOUTH 2 (TWO) TIMES DAILY. 180 tablet 1  . gabapentin (NEURONTIN) 600 MG tablet Take 600 mg by mouth 3 (three) times daily.    Marland Kitchen guaiFENesin (MUCINEX) 600 MG 12 hr tablet  Take 600 mg by mouth 2 (two) times daily as needed for cough or to loosen phlegm.     . insulin detemir (LEVEMIR) 100 UNIT/ML injection Inject 0.4 mLs (40 Units total) into the skin at bedtime. 10 mL 11  . insulin lispro (HUMALOG KWIKPEN) 100 UNIT/ML KiwkPen Inject 6-14 Units into the skin See admin instructions. Inject 6-14 units subcutaneously before each meal per sliding scale: CBG 201-250 6 units, 251-300 8 units, 301-350 10 units, 351-400 12 units, over 400 14 units and call MD    . lidocaine (LIDODERM) 5 % Place 1 patch onto the skin daily as needed (right hip pain). Remove & Discard patch within 12 hours or as directed by MD     . magnesium oxide (MAG-OX) 400 MG tablet Take 400 mg by mouth 3 (three) times daily.     . Melatonin 5 MG CAPS Take 5 mg by mouth at bedtime.    . metoprolol succinate (TOPROL-XL) 25 MG 24 hr tablet Take 1 tablet (25 mg total) by mouth daily. Take with or immediately following a meal. 30 tablet 5  . miconazole (MICOTIN) 2 % powder Apply 1 application topically See admin instructions. Apply topically to abdominal folds twice daily    . ondansetron (ZOFRAN) 4  MG tablet Take 1 tablet (4 mg total) by mouth every 6 (six) hours as needed for nausea. 20 tablet 0  . OxyCODONE HCl, Abuse Deter, (OXAYDO) 5 MG TABA Take 1 tablet by mouth 3 (three) times daily as needed (pain). (Patient taking differently: Take 1 tablet by mouth 3 (three) times daily as needed (moderate pain). ) 5 tablet 0  . OXYGEN Inhale 2 L into the lungs as needed (to keep SATS greater than 90%).    . potassium chloride SA (K-DUR,KLOR-CON) 20 MEQ tablet Take 1 tablet (20 mEq total) by mouth daily. 30 tablet 0  . pramipexole (MIRAPEX) 0.125 MG tablet Take 0.125 mg by mouth daily.    Marland Kitchen senna-docusate (SENOKOT-S) 8.6-50 MG tablet Take 1 tablet by mouth at bedtime as needed for mild constipation.    . sertraline (ZOLOFT) 100 MG tablet Take 100 mg by mouth daily.    Marland Kitchen tiZANidine (ZANAFLEX) 4 MG tablet Take 4 mg by  mouth 3 (three) times daily as needed for muscle spasms.    . Turmeric 500 MG CAPS Take 500 mg by mouth daily.     Marland Kitchen venlafaxine XR (EFFEXOR-XR) 150 MG 24 hr capsule Take 150 mg by mouth daily with breakfast.    . warfarin (COUMADIN) 10 MG tablet Take 10 mg by mouth See admin instructions. Take one tablet (10 mg) by mouth on Tuesday nights (take 7.5 mg tablet on all other nights of the week)    . warfarin (COUMADIN) 7.5 MG tablet Take 7.5 mg by mouth See admin instructions. Take one tablet (7.5 mg) by mouth at bedtime except on Tuesdays (take 10 mg tablet on Tuesdays)    . metolazone (ZAROXOLYN) 2.5 MG tablet Take every Sunday and Wednesday. 90 tablet 1   No current facility-administered medications for this visit.     Allergies  Allergen Reactions  . Bee Venom Hives    Takes benadryl  . Other Other (See Comments)    Raw foods with seeds give her "boils".  Avoids raw strawberries, blueberries. Tolerates cooked fruits, tomato sauce, bread/grains with seeds. [facility] 10/17/13 Berries with seeds Allergy to glutamine-C-quercet-selen-brom per [facility] 09/25/17  . Alteplase Rash  . Cefepime Rash  . Fentanyl Other (See Comments)    Hallucinations, syncope    . Metformin And Related Diarrhea    Past Medical History:  Diagnosis Date  . CHF (congestive heart failure) ([place])   . Chronic /permanent atrial fibrillation ([place]) 2005   On Warfarin (follwed @ [facility]); Rate controlled - On BB  . Chronic diastolic heart failure, NYHA class 2 ([place])    Updated by A. fib, hypertensive heart disease and obesity; EDP was only 7 by cardiac catheterization in September 2017  . Diabetic lumbosacral plexopathy ([place])   . Fibromyalgia   . Generalized anxiety disorder   . Hypokalemia 09/03/2017  . Incontinence   . Major depressive disorder   . OSA on CPAP   . Restless leg syndrome   . Syncope 09/03/2017  . Type II diabetes mellitus ([place])     Social History   Socioeconomic History  .  Marital status: Married    Spouse name: Not on file  . Number of children: Not on file  . Years of education: Not on file  . Highest education level: Not on file  Occupational History  . Not on file  Social Needs  . Financial resource strain: Not on file  . Food insecurity:    Worry: Not on file  Inability: Not on file  . Transportation needs:    Medical: Not on file    Non-medical: Not on file  Tobacco Use  . Smoking status: Never Smoker  . Smokeless tobacco: Never Used  Substance and Sexual Activity  . Alcohol use: No  . Drug use: No  . Sexual activity: Yes  Lifestyle  . Physical activity:    Days per week: Not on file    Minutes per session: Not on file  . Stress: Not on file  Relationships  . Social connections:    Talks on phone: Not on file    Gets together: Not on file    Attends religious service: Not on file    Active member of club or organization: Not on file    Attends meetings of clubs or organizations: Not on file    Relationship status: Not on file  . Intimate partner violence:    Fear of current or ex partner: Not on file    Emotionally abused: Not on file    Physically abused: Not on file    Forced sexual activity: Not on file  Other Topics Concern  . Not on file  Social History Narrative  . Not on file     Family History  Problem Relation Age of Onset  . Hypertension Mother   . Heart disease Mother   . Leukemia Son      Review of Systems: General: negative for chills, fever, night sweats or weight changes.  Cardiovascular: negative for chest pain, dyspnea on exertion, edema, orthopnea, palpitations, paroxysmal nocturnal dyspnea or shortness of breath Dermatological: negative for rash Respiratory: negative for cough or wheezing Urologic: negative for hematuria Abdominal: negative for nausea, vomiting, diarrhea, bright red blood per rectum, melena, or hematemesis Neurologic: negative for visual changes, syncope, or dizziness All other  systems reviewed and are otherwise negative except as noted above.    Blood pressure 116/68, pulse 96, height 5\' 10"  (1.778 m).  General appearance: alert, cooperative, no distress and morbidly obese Lungs: clear to auscultation bilaterally Heart: irregularly irregular rhythm Extremities: 1-2+ bilateral LE edema Skin: pale, dry Neurologic: Grossly normal   ASSESSMENT AND PLAN:   Venous stasis of both lower extremities Chronic edema- 1st time I have seen her, her symptoms seem a little out of proportion to her physical findings  Morbid obesity ([place]) BMI 41, wheel chair bound, in SNF for deconditioning  OSA (obstructive sleep apnea) Reports compliance with C-pap  Essential hypertension Controlled  Chronic atrial fibrillation with RVR (HCC) Controlled- on Coumadin   PLAN  Add Zaroxolyn 2.5 mg Q Day x 2 to Lasix 80 mg daily. Then Zaroxolyn 2.5 mg twice a week- Sunday and Wednesday.  She admitted she had been "hydrating' at home per discharge instructions from July. I asked her not to do this. If she continues to have problems we may need to consider a Rt heart cath to determine volume status.   I did note that she was significantly anemic. I'll recheck a CBC and BMP  Kerin Ransom PA-C 10/20/2017 2:58 PM

## 2017-10-20 NOTE — Assessment & Plan Note (Signed)
BMI 41, wheel chair bound, in SNF for deconditioning

## 2017-10-20 NOTE — Assessment & Plan Note (Signed)
Chronic edema- 1st time I have seen her, her symptoms seem a little out of proportion to her physical findings

## 2017-10-20 NOTE — Assessment & Plan Note (Signed)
Reports compliance with C-pap 

## 2017-10-20 NOTE — Assessment & Plan Note (Signed)
Controlled- on Coumadin

## 2017-10-20 NOTE — Assessment & Plan Note (Signed)
Controlled.  

## 2017-10-21 LAB — BASIC METABOLIC PANEL
BUN/Creatinine Ratio: 19 (ref 12–28)
BUN: 15 mg/dL (ref 8–27)
CALCIUM: 9.4 mg/dL (ref 8.7–10.3)
CO2: 26 mmol/L (ref 20–29)
CREATININE: 0.77 mg/dL (ref 0.57–1.00)
Chloride: 102 mmol/L (ref 96–106)
GFR, EST AFRICAN AMERICAN: 93 mL/min/{1.73_m2} (ref >59)
GFR, EST NON AFRICAN AMERICAN: 81 mL/min/{1.73_m2} (ref >59)
Glucose: 158 mg/dL — ABNORMAL HIGH (ref 65–99)
Potassium: 4 mmol/L (ref 3.5–5.2)
Sodium: 145 mmol/L — ABNORMAL HIGH (ref 134–144)

## 2017-10-21 LAB — CBC
HEMATOCRIT: 32.6 % — AB (ref 34.0–46.6)
HEMOGLOBIN: 9.7 g/dL — AB (ref 11.1–15.9)
MCH: 23.8 pg — ABNORMAL LOW (ref 26.6–33.0)
MCHC: 29.8 g/dL — ABNORMAL LOW (ref 31.5–35.7)
MCV: 80 fL (ref 79–97)
Platelets: 221 10*3/uL (ref 150–450)
RBC: 4.08 x10E6/uL (ref 3.77–5.28)
RDW: 19.3 % — AB (ref 12.3–15.4)
WBC: 7.1 10*3/uL (ref 3.4–10.8)

## 2017-10-22 ENCOUNTER — Telehealth: Payer: Self-pay

## 2017-10-22 NOTE — Telephone Encounter (Signed)
DPR on file. Spoke with pt's husband. Pt's husband aware of pt's labs results with verbal understanding.  Notes recorded by Almyra Deforest, PA on 10/21/2017 at 4:36 PM EDT Potassium normal, kidney function stable. Anemia slowly improving..  pt husband will inform the pt

## 2017-10-22 NOTE — Telephone Encounter (Signed)
-----   Message from [place], [place] sent at 10/21/2017  4:36 PM EDT ----- Potassium normal, kidney function stable. Anemia slowly improving.

## 2017-10-22 NOTE — Telephone Encounter (Signed)
-----   Message from [place], [place] sent at 10/21/2017  4:36 PM EDT ----- Potassium normal, kidney function stable. Anemia slowly improving.

## 2017-10-23 ENCOUNTER — Encounter (HOSPITAL_COMMUNITY): Payer: Self-pay

## 2017-10-23 ENCOUNTER — Emergency Department (HOSPITAL_COMMUNITY)
Admission: EM | Admit: 2017-10-23 | Discharge: 2017-10-24 | Disposition: A | Discharge status: Home / Self Care | Payer: Medicare Other | Attending: Emergency Medicine | Admitting: Emergency Medicine

## 2017-10-23 DIAGNOSIS — D649 Anemia, unspecified: Secondary | ICD-10-CM | POA: Diagnosis not present

## 2017-10-23 DIAGNOSIS — N390 Urinary tract infection, site not specified: Secondary | ICD-10-CM | POA: Insufficient documentation

## 2017-10-23 DIAGNOSIS — R2689 Other abnormalities of gait and mobility: Secondary | ICD-10-CM | POA: Diagnosis not present

## 2017-10-23 DIAGNOSIS — I5032 Chronic diastolic (congestive) heart failure: Secondary | ICD-10-CM | POA: Insufficient documentation

## 2017-10-23 DIAGNOSIS — Z79899 Other long term (current) drug therapy: Secondary | ICD-10-CM | POA: Insufficient documentation

## 2017-10-23 DIAGNOSIS — Z7901 Long term (current) use of anticoagulants: Secondary | ICD-10-CM | POA: Insufficient documentation

## 2017-10-23 DIAGNOSIS — E119 Type 2 diabetes mellitus without complications: Secondary | ICD-10-CM | POA: Diagnosis not present

## 2017-10-23 DIAGNOSIS — Z96651 Presence of right artificial knee joint: Secondary | ICD-10-CM | POA: Insufficient documentation

## 2017-10-23 DIAGNOSIS — M25562 Pain in left knee: Secondary | ICD-10-CM | POA: Diagnosis not present

## 2017-10-23 DIAGNOSIS — E876 Hypokalemia: Secondary | ICD-10-CM | POA: Insufficient documentation

## 2017-10-23 DIAGNOSIS — Z96641 Presence of right artificial hip joint: Secondary | ICD-10-CM | POA: Insufficient documentation

## 2017-10-23 DIAGNOSIS — Z794 Long term (current) use of insulin: Secondary | ICD-10-CM | POA: Insufficient documentation

## 2017-10-23 DIAGNOSIS — I110 Hypertensive heart disease with heart failure: Secondary | ICD-10-CM | POA: Insufficient documentation

## 2017-10-23 NOTE — ED Notes (Signed)
Pt o2 between 86-89%, notified Susannah(RN)

## 2017-10-23 NOTE — ED Triage Notes (Signed)
Pt to ED for left knee pain and inability to ambulate. Pt was discharged from [facility] earlier today with discharge instructions for outpatient rehab. Pt states she is unable to walk and she is incontinent.

## 2017-10-24 ENCOUNTER — Other Ambulatory Visit: Payer: Self-pay

## 2017-10-24 LAB — BASIC METABOLIC PANEL
ANION GAP: 13 (ref 5–15)
BUN: 14 mg/dL (ref 8–23)
CO2: 31 mmol/L (ref 22–32)
Calcium: 9.5 mg/dL (ref 8.9–10.3)
Chloride: 98 mmol/L (ref 98–111)
Creatinine, Ser: 0.83 mg/dL (ref 0.44–1.00)
GFR calc Af Amer: >60 mL/min (ref >60)
GLUCOSE: 174 mg/dL — AB (ref 70–99)
POTASSIUM: 2.9 mmol/L — AB (ref 3.5–5.1)
Sodium: 142 mmol/L (ref 135–145)

## 2017-10-24 LAB — CBC WITH DIFFERENTIAL/PLATELET
ABS IMMATURE GRANULOCYTES: 0.1 10*3/uL (ref 0.0–0.1)
BASOS ABS: 0 10*3/uL (ref 0.0–0.1)
Basophils Relative: 1 %
Eosinophils Absolute: 0.1 10*3/uL (ref 0.0–0.7)
Eosinophils Relative: 2 %
HCT: 34.4 % — ABNORMAL LOW (ref 36.0–46.0)
HEMOGLOBIN: 9.6 g/dL — AB (ref 12.0–15.0)
Immature Granulocytes: 1 %
LYMPHS PCT: 18 %
Lymphs Abs: 1.3 10*3/uL (ref 0.7–4.0)
MCH: 23.4 pg — AB (ref 26.0–34.0)
MCHC: 27.9 g/dL — AB (ref 30.0–36.0)
MCV: 83.9 fL (ref 78.0–100.0)
MONO ABS: 0.6 10*3/uL (ref 0.1–1.0)
MONOS PCT: 8 %
NEUTROS ABS: 5.3 10*3/uL (ref 1.7–7.7)
Neutrophils Relative %: 70 %
Platelets: 192 10*3/uL (ref 150–400)
RBC: 4.1 MIL/uL (ref 3.87–5.11)
RDW: 19.8 % — ABNORMAL HIGH (ref 11.5–15.5)
WBC: 7.5 10*3/uL (ref 4.0–10.5)

## 2017-10-24 LAB — URINALYSIS, ROUTINE W REFLEX MICROSCOPIC
Bilirubin Urine: NEGATIVE
Glucose, UA: NEGATIVE mg/dL
HGB URINE DIPSTICK: NEGATIVE
Ketones, ur: NEGATIVE mg/dL
Nitrite: POSITIVE — AB
PROTEIN: NEGATIVE mg/dL
SPECIFIC GRAVITY, URINE: 1.011 (ref 1.005–1.030)
pH: 8 (ref 5.0–8.0)

## 2017-10-24 LAB — PROTIME-INR
INR: 1.28
Prothrombin Time: 15.9 seconds — ABNORMAL HIGH (ref 11.4–15.2)

## 2017-10-24 MED ORDER — TRAMADOL HCL 50 MG PO TABS
50.0000 mg | ORAL_TABLET | Freq: Once | ORAL | Status: AC
  Administered 2017-10-24: 50 mg via ORAL
  Filled 2017-10-24: qty 1

## 2017-10-24 MED ORDER — POTASSIUM CHLORIDE CRYS ER 20 MEQ PO TBCR
40.0000 meq | EXTENDED_RELEASE_TABLET | ORAL | Status: AC
Start: 2017-10-24 — End: 2017-10-24
  Administered 2017-10-24 (×3): 40 meq via ORAL
  Filled 2017-10-24 (×2): qty 2

## 2017-10-24 MED ORDER — OXYCODONE-ACETAMINOPHEN 5-325 MG PO TABS
1.0000 | ORAL_TABLET | Freq: Once | ORAL | Status: AC
  Administered 2017-10-24: 1 via ORAL
  Filled 2017-10-24: qty 1

## 2017-10-24 MED ORDER — OXYCODONE HCL 5 MG PO TABS
5.0000 mg | ORAL_TABLET | Freq: Once | ORAL | Status: AC
  Administered 2017-10-24: 5 mg via ORAL
  Filled 2017-10-24: qty 1

## 2017-10-24 MED ORDER — NITROFURANTOIN MONOHYD MACRO 100 MG PO CAPS: 100.0000 mg | ORAL_CAPSULE | Freq: Two times a day (BID) | ORAL | 0 refills | Status: DC

## 2017-10-24 NOTE — ED Notes (Signed)
Regular Diet was ordered for Lunch. 

## 2017-10-24 NOTE — ED Notes (Signed)
Patient has a Pure Wick on.

## 2017-10-24 NOTE — ED Notes (Signed)
Pt noted to be tearful. Tissues given and pt encouraged to vent feelings/concerns. Pt given portable phone as requested.

## 2017-10-24 NOTE — ED Notes (Signed)
Pt's spouse coming to pick up pt at 1400. Portable commode in room for pt to take home - given by CM. Pt aware UTI and will be given rx. Voices understanding to follow up w/PCP.

## 2017-10-24 NOTE — Progress Notes (Signed)
CSW spoke with pt at bedside. CSW aware that pt was recently discharged from [facility]. Pt report that pt was kicked out of the facility as pt's insurance stopped paying. CSW expressed to pt that usually once insurance stops paying then pt's are discharged with other services to continue care with. Pt expressed the inability to walk at this time. Pt reported to CSW that pt went to the facility for rehab on right leg and is now having problems with the left leg. Pt expressed having a MD appointment on this upcoming Tuesday to see about another knee replacement for the left leg.   CSW explained to pt that if Medicare has already paid for placement then they may not pay again as pt hasnt been in the wellness days needed. CSW explained that this may result in pt having to pay out of pocket for SNF stay. Pt expressed understanding of this as pt expressed that facility attempted to explain this to pt as well.   CSW informed pt that CSW would speak with RNCM to see what other options were available to pt at this time. CSW will continue to follow for SNF placement needs.   Donna [name], MSW, [facility] Clinical Social Worker [phone]

## 2017-10-24 NOTE — ED Provider Notes (Signed)
[facility] EMERGENCY DEPARTMENT Provider Note   CSN: [id number] Arrival date & time: 10/23/17  1801     History   Chief Complaint Chief Complaint  Patient presents with  . Knee Pain  . Gait Problem    HPI Donna [name] is a 67 y.o. female.  The history is provided by the patient.  She has history of diabetes, persistent atrial fibrillation, anticoagulation with warfarin, chronic diastolic heart failure and comes to the ED because of left leg pain.  She had been admitted to the hospital on July 26, discharged July 31 because of a draining wound treated with antibiotics.  She was sent to [facility] for rehabilitation, and was discharged today.  She was unable to bear weight and was unable to get into her home because of required going up some steps.  She is status post right knee and right hip replacement in states that she had fallen while she was in the nursing home and had reinjured her left knee.  She did have an intra-articular injection in the knee, but continues to have pain.  She denies fever or chills.  She is supposed to see her orthopedic surgeon at [facility] next month about possibly getting left knee replacement.  Past Medical History:  Diagnosis Date  . CHF (congestive heart failure) ([place])   . Chronic /permanent atrial fibrillation ([place]) 2005   On Warfarin (follwed @ [facility]); Rate controlled - On BB  . Chronic diastolic heart failure, NYHA class 2 ([place])    Updated by A. fib, hypertensive heart disease and obesity; EDP was only 7 by cardiac catheterization in September 2017  . Diabetic lumbosacral plexopathy ([place])   . Fibromyalgia   . Generalized anxiety disorder   . Hypokalemia 09/03/2017  . Incontinence   . Major depressive disorder   . OSA on CPAP   . Restless leg syndrome   . Syncope 09/03/2017  . Type II diabetes mellitus [facility])     Patient Active Problem List   Diagnosis Date Noted  . Decubitus ulcer  of ischium, left, stage IV ([place]) 09/28/2017  . Atrial fibrillation with RVR ([place]) 09/25/2017  . Cellulitis of sacral region 09/25/2017  . Normocytic anemia 09/25/2017  . Insulin-requiring or dependent type II diabetes mellitus ([place]) 09/25/2017  . Chronic pain 09/25/2017  . Chronic atrial fibrillation with RVR (HCC)   . History of seizures 09/03/2017  . Depression 02/09/2017  . Anxiety 02/09/2017  . Venous stasis of both lower extremities 07/25/2016  . Essential hypertension 07/25/2016  . Anticoagulant long-term use 07/15/2016  . Anatomical narrow angle borderline glaucoma of both eyes 04/02/2016  . Chronic diastolic heart failure ([place]) 02/08/2016  . Chronic atrial fibrillation ([place]): CHA2DS2-VASc Score 5; on Warfarin 02/08/2016  . Morbid obesity ([place]) 02/08/2016  . OSA (obstructive sleep apnea) 02/08/2016  . Pre-operative cardiovascular examination 02/08/2016  . Medication management 02/08/2016  . Fibromyalgia 08/07/2012  . Restless legs syndrome 08/07/2012    Past Surgical History:  Procedure Laterality Date  . CARDIAC CATHETERIZATION  11/09/2015   [facility]: Nonocclusive CAD. EF 55%. EDP 7 mmHg.  Marland Kitchen CHOLECYSTECTOMY OPEN    . JOINT REPLACEMENT    . KNEE ARTHROSCOPY Right   . MASS EXCISION Left    sarcoma - shoulder  . SHOULDER OPEN ROTATOR CUFF REPAIR Bilateral   . TONSILLECTOMY    . TOTAL HIP ARTHROPLASTY Right 2015  . TOTAL HIP REVISION Right 2015  . TOTAL KNEE ARTHROPLASTY Right   .  TRANSTHORACIC ECHOCARDIOGRAM  11/2013; 02/2016   a) [facility]: with Def[place]:  EF 60-65%. Mod LA dilation. Mild AoV sclerosis. Normal RHP. Indeterminate LV filling pressures. b) [facility]: Normal cavity size. EF 55-60%.no RWMA, mild AoV sclerosis (R cusp), Mod MAC. Mild biAtrial dilation.  Marland Kitchen VAGINAL HYSTERECTOMY       OB History   None      Home Medications    Prior to Admission medications   Medication Sig Start Date End Date Taking? Authorizing Provider  acetaminophen  (TYLENOL) 500 MG tablet Take 500 mg by mouth every 6 (six) hours as needed for mild pain.     [provider]  ALPRAZolam Duanne Moron) 0.25 MG tablet Take 1 tablet (0.25 mg total) by mouth 3 (three) times daily. 09/07/17   Sheikh, Georgina Quint Latif, DO  busPIRone (BUSPAR) 10 MG tablet Take 10 mg by mouth daily.     [provider]  Cholecalciferol (VITAMIN D) 2000 units tablet Take 4,000 Units by mouth daily.     [provider]  collagenase (SANTYL) ointment Apply topically daily. 09/30/17   Georgette Shell, MD  diltiazem (CARDIZEM) 30 MG tablet Take 1 tablet (30 mg total) by mouth every 6 (six) hours. 09/30/17   Georgette Shell, MD  doxycycline (VIBRA-TABS) 100 MG tablet Take 1 tablet (100 mg total) by mouth every 12 (twelve) hours. 09/30/17   Georgette Shell, MD  Dulaglutide (TRULICITY) 1.5 GQ/6.7YP SOPN Inject 1.5 mg into the skin every Tuesday.    [provider]  ezetimibe (ZETIA) 10 MG tablet Take 10 mg by mouth at bedtime.     [provider]  ferrous sulfate 325 (65 FE) MG tablet Take 325 mg by mouth See admin instructions. Take one tablet (325 mg) by mouth on Sunday, Monday, Wednesday and Friday mornings.    [provider]  furosemide (LASIX) 40 MG tablet Take 40 mg by mouth daily.    [provider]  furosemide (LASIX) 80 MG tablet TAKE 1 TABLET (80 MG TOTAL) BY MOUTH 2 (TWO) TIMES DAILY. 10/01/17   Leonie Man, MD  gabapentin (NEURONTIN) 600 MG tablet Take 600 mg by mouth 3 (three) times daily.    [provider]  guaiFENesin (MUCINEX) 600 MG 12 hr tablet Take 600 mg by mouth 2 (two) times daily as needed for cough or to loosen phlegm.     [provider]  insulin detemir (LEVEMIR) 100 UNIT/ML injection Inject 0.4 mLs (40 Units total) into the skin at bedtime. 09/30/17   Georgette Shell, MD  insulin lispro (HUMALOG KWIKPEN) 100 UNIT/ML KiwkPen Inject 6-14 Units into the skin See admin instructions.  Inject 6-14 units subcutaneously before each meal per sliding scale: CBG 201-250 6 units, 251-300 8 units, 301-350 10 units, 351-400 12 units, over 400 14 units and call MD    [provider]  lidocaine (LIDODERM) 5 % Place 1 patch onto the skin daily as needed (right hip pain). Remove & Discard patch within 12 hours or as directed by MD     [provider]  magnesium oxide (MAG-OX) 400 MG tablet Take 400 mg by mouth 3 (three) times daily.     [provider]  Melatonin 5 MG CAPS Take 5 mg by mouth at bedtime.    [provider]  metolazone (ZAROXOLYN) 2.5 MG tablet Take every Sunday and Wednesday. 10/20/17   Erlene Quan, PA-C  metoprolol succinate (TOPROL-XL) 25 MG 24 hr tablet Take 1 tablet (  25 mg total) by mouth daily. Take with or immediately following a meal. 09/23/17   Almyra Deforest, PA  miconazole (MICOTIN) 2 % powder Apply 1 application topically See admin instructions. Apply topically to abdominal folds twice daily    [provider]  ondansetron (ZOFRAN) 4 MG tablet Take 1 tablet (4 mg total) by mouth every 6 (six) hours as needed for nausea. 09/30/17   Georgette Shell, MD  OxyCODONE HCl, Abuse Deter, (OXAYDO) 5 MG TABA Take 1 tablet by mouth 3 (three) times daily as needed (pain). Patient taking differently: Take 1 tablet by mouth 3 (three) times daily as needed (moderate pain).  09/07/17   Raiford Noble Latif, DO  OXYGEN Inhale 2 L into the lungs as needed (to keep SATS greater than 90%).    [provider]  potassium chloride SA (K-DUR,KLOR-CON) 20 MEQ tablet Take 1 tablet (20 mEq total) by mouth daily. 10/01/17   Georgette Shell, MD  pramipexole (MIRAPEX) 0.125 MG tablet Take 0.125 mg by mouth daily.    [provider]  senna-docusate (SENOKOT-S) 8.6-50 MG tablet Take 1 tablet by mouth at bedtime as needed for mild constipation. 09/30/17   Georgette Shell, MD  sertraline (ZOLOFT) 100 MG tablet Take 100 mg by mouth daily.     [provider]  tiZANidine (ZANAFLEX) 4 MG tablet Take 4 mg by mouth 3 (three) times daily as needed for muscle spasms.    [provider]  Turmeric 500 MG CAPS Take 500 mg by mouth daily.     [provider]  venlafaxine XR (EFFEXOR-XR) 150 MG 24 hr capsule Take 150 mg by mouth daily with breakfast.    [provider]  warfarin (COUMADIN) 10 MG tablet Take 10 mg by mouth See admin instructions. Take one tablet (10 mg) by mouth on Tuesday nights (take 7.5 mg tablet on all other nights of the week)    [provider]  warfarin (COUMADIN) 7.5 MG tablet Take 7.5 mg by mouth See admin instructions. Take one tablet (7.5 mg) by mouth at bedtime except on Tuesdays (take 10 mg tablet on Tuesdays)    [provider]    Family History Family History  Problem Relation Age of Onset  . Hypertension Mother   . Heart disease Mother   . Leukemia Son     Social History Social History   Tobacco Use  . Smoking status: Never Smoker  . Smokeless tobacco: Never Used  Substance Use Topics  . Alcohol use: No  . Drug use: No     Allergies   Bee venom; Other; Alteplase; Cefepime; Fentanyl; and Metformin and related   Review of Systems Review of Systems  All other systems reviewed and are negative.    Physical Exam Updated Vital Signs BP (!) 136/59 (BP Location: Right Arm)   Pulse 94   Temp 98.6 F (37 C)   Resp 18   Ht 5' 10"  (1.778 m)   Wt 129.3 kg   SpO2 (!) 89%   BMI 40.89 kg/m   Physical Exam  Nursing note and vitals reviewed.  Morbidly obese 67 year old female, resting comfortably and in no acute distress. Vital signs are normal. Oxygen saturation is 89%, which is hypoxic. Head is normocephalic and atraumatic. PERRLA, EOMI. Oropharynx is clear. Neck is nontender and supple without adenopathy or JVD. Back is nontender and there is no CVA tenderness. Lungs are clear without rales, wheezes, or rhonchi. Chest is  nontender. Heart  has an irregular rhythm without murmur. Abdomen is soft, flat, nontender without masses or hepatosplenomegaly and peristalsis is normoactive. Extremities have 1+ edema.  No deformity seen.  Pain on passive range of motion of the left knee. Skin is warm and dry without rash. Neurologic: Mental status is normal, cranial nerves are intact, there are no motor or sensory deficits.  ED Treatments / Results  Labs (all labs ordered are listed, but only abnormal results are displayed) Labs Reviewed  BASIC METABOLIC PANEL - Abnormal; Notable for the following components:      Result Value   Potassium 2.9 (*)    Glucose, Bld 174 (*)    All other components within normal limits  CBC WITH DIFFERENTIAL/PLATELET - Abnormal; Notable for the following components:   Hemoglobin 9.6 (*)    HCT 34.4 (*)    MCH 23.4 (*)    MCHC 27.9 (*)    RDW 19.8 (*)    All other components within normal limits  URINALYSIS, ROUTINE W REFLEX MICROSCOPIC - Abnormal; Notable for the following components:   Nitrite POSITIVE (*)    Leukocytes, UA TRACE (*)    Bacteria, UA MANY (*)    All other components within normal limits  PROTIME-INR - Abnormal; Notable for the following components:   Prothrombin Time 15.9 (*)    All other components within normal limits    Procedures Procedures  Medications Ordered in ED Medications  potassium chloride SA (K-DUR,KLOR-CON) CR tablet 40 mEq (has no administration in time range)  traMADol (ULTRAM) tablet 50 mg (50 mg Oral Given 10/24/17 0154)  oxyCODONE (Oxy IR/ROXICODONE) immediate release tablet 5 mg (5 mg Oral Given 10/24/17 0440)     Initial Impression / Assessment and Plan / ED Course  I have reviewed the triage vital signs and the nursing notes.  Pertinent labs & imaging results that were available during my care of the patient were reviewed by me and considered in my medical decision making (see chart for details).  Left knee pain probably secondary to  chronic arthritis.  Old records are reviewed confirming recent hospitalization for wound infection.  Also, on August 12, she had fluoroscopy guided arthrocentesis with injection of methylprednisolone and Marcaine.  At this point, no medical issues that need immediate addressing.  Will hold in the ED and ask social service to see patient to arrange appropriate placement or services.  Screening labs do show significant hypokalemia, and she is given oral potassium.  Also noted is normocytic anemia which is unchanged from baseline.  Social service consult is pending.  Final Clinical Impressions(s) / ED Diagnoses   Final diagnoses:  Pain in joint of left knee  Hypokalemia  Normocytic anemia    ED Discharge Orders    None       Delora Fuel, MD 00/86/76 [phone]

## 2017-10-24 NOTE — ED Notes (Signed)
Pt given Ginger Ale while waiting on lunch tray. Pt spoke w/her spouse and requested for SW to call him. SW aware.

## 2017-10-24 NOTE — Clinical Social Work Note (Signed)
Clinical Social Work Assessment  Patient Details  Name: Donna [name] MRN: [id number] Date of Birth: [birth year]  Date of referral:  10/24/17               Reason for consult:  Facility Placement, Discharge Planning                Permission sought to share information with:  Other Permission granted to share information::  No  Name::     none given  Agency::   none given  Relationship::  none given   Contact Information:  none given   Housing/Transportation Living arrangements for the past 2 months:  [place], [place] of Information:  Patient Patient Interpreter Needed:  None Criminal Activity/Legal Involvement Pertinent to Current Situation/Hospitalization:  No - Comment as needed Significant Relationships:  Adult Children, Spouse Lives with:  Spouse Do you feel safe going back to the place where you live?  Yes Need for family participation in patient care:  Yes (Comment)  Care giving concerns:  CSW consulted for possible SNF placement.    Social Worker assessment / plan:  CSW spoke with pt at bedside. CSW was advised that pt usually lives at home with spouse, however pt has been at [facility]. Pt reports that pt is unable to walk but was still discharged from facility as insurance "kicked pt out". Pt appeared to be very tearful while speaking to CSW. CSW attempted to explained to pt the barriers to placing pt again as pt may have to pay out of pocket since insurance is not willing to pay at this time.   Pt expressed that pt has a son that could help however pt does not like asking son for help as pt expressed that son has a store to run.   Employment status:  Other (Comment) Insurance information:  Medicare PT Recommendations:  Not assessed at this time Information / Referral to community resources:  [place], Other (Comment Required)(spoke with pt about Home health options. )  Patient/Family's Response to care:  Pt appeared to be  understanding of barriers to placement at this time. Pt expressed that pt's spouse wouldn't be fund of someone coming into the home to help pt but understands that this may be the only option for pt until SNF is.   Patient/Family's Understanding of and Emotional Response to Diagnosis, Current Treatment, and Prognosis:  At this time no further questions or concerns have been presented to CSW.   Emotional Assessment Appearance:  Appears stated age Attitude/Demeanor/Rapport:  Engaged Affect (typically observed):  Appropriate, Tearful/Crying, Sad Orientation:  Oriented to Self, Oriented to Situation, Oriented to Place, Oriented to  Time Alcohol / Substance use:  Not Applicable Psych involvement (Current and /or in the community):  No (Comment)  Discharge Needs  Concerns to be addressed:  Discharge Planning Concerns, Basic Needs Readmission within the last 30 days:  Yes Current discharge risk:  Dependent with Mobility Barriers to Discharge:  Continued Medical Work up   [employer], [place] 10/24/2017, 7:54 AM

## 2017-10-24 NOTE — ED Notes (Addendum)
Spouse arrived to pick up pt. Pt leaving in hospital gown and blue scrub pants d/t clothing soiled. Pt wearing here eyeglasses and purse. States her cell phone is at home. Pt voiced understanding of taking and completing antibiotic and to follow up w/PCP. Spouse carried pt's portable toilet. Pt able to get into spouse's SUV independently per Opal Sidles, EMT.

## 2017-10-24 NOTE — Progress Notes (Signed)
CSW and RNCM received permission via pt to speak with pt's spouse at this time. CSW has reached out to number provided in chart-however no answer at this time. CSW to try back later.   Donna [name], MSW, [place] [facility] Clinical Social Worker [phone]

## 2017-10-24 NOTE — ED Notes (Signed)
CM and SW in w/pt.

## 2017-10-24 NOTE — Care Management (Signed)
ED CM and ED CSW met with patient to discuss transitional care options. Patient was discharge yesterday from [facility] because ran out of insurance days. Option of self pay was given patient  Patient was offered [facility] services but declined she stated, her husband does not want anyone in the home. CM and CSW explained  that patient is not meeting  criteria for hospitalization, patient verbalized understand teach back done. Discussed TC options of outpatient PT and OT or [place] services. Patient decided on discharge home with [facility] services. Offered choice. No DME needs identified as per patient she has w/c rolling walker.  of preferred [facility] agencies [facility] selected,  Referral call in spoke with [facility] liaison referral was accepted. Husband will come to ED to transport patient home. PTAR offered but was declined. No further ED CM needs identified

## 2017-10-24 NOTE — ED Notes (Signed)
Pt arrived to [facility] via stretcher. Pt noted to be alert, oriented. O2 infusing at 2L/min. Purewick intact. SW in w/pt.

## 2017-10-27 DIAGNOSIS — M1711 Unilateral primary osteoarthritis, right knee: Secondary | ICD-10-CM | POA: Diagnosis not present

## 2017-10-27 DIAGNOSIS — Z794 Long term (current) use of insulin: Secondary | ICD-10-CM | POA: Diagnosis not present

## 2017-10-27 DIAGNOSIS — Z96651 Presence of right artificial knee joint: Secondary | ICD-10-CM | POA: Diagnosis not present

## 2017-10-27 DIAGNOSIS — E119 Type 2 diabetes mellitus without complications: Secondary | ICD-10-CM | POA: Diagnosis not present

## 2017-10-27 DIAGNOSIS — M25561 Pain in right knee: Secondary | ICD-10-CM | POA: Diagnosis not present

## 2017-10-27 DIAGNOSIS — Z96652 Presence of left artificial knee joint: Secondary | ICD-10-CM | POA: Diagnosis not present

## 2017-10-27 DIAGNOSIS — M25562 Pain in left knee: Secondary | ICD-10-CM | POA: Diagnosis not present

## 2017-10-27 DIAGNOSIS — M1712 Unilateral primary osteoarthritis, left knee: Secondary | ICD-10-CM | POA: Diagnosis not present

## 2017-10-27 DIAGNOSIS — M175 Other unilateral secondary osteoarthritis of knee: Secondary | ICD-10-CM | POA: Diagnosis not present

## 2017-11-05 DIAGNOSIS — M545 Low back pain: Secondary | ICD-10-CM | POA: Diagnosis not present

## 2017-11-05 DIAGNOSIS — E876 Hypokalemia: Secondary | ICD-10-CM | POA: Diagnosis not present

## 2017-11-05 DIAGNOSIS — E1140 Type 2 diabetes mellitus with diabetic neuropathy, unspecified: Secondary | ICD-10-CM | POA: Diagnosis not present

## 2017-11-05 DIAGNOSIS — I482 Chronic atrial fibrillation: Secondary | ICD-10-CM | POA: Diagnosis not present

## 2017-11-05 DIAGNOSIS — Z7901 Long term (current) use of anticoagulants: Secondary | ICD-10-CM | POA: Diagnosis not present

## 2017-11-10 ENCOUNTER — Other Ambulatory Visit: Payer: Self-pay | Admitting: Cardiology

## 2017-11-10 MED ORDER — METOPROLOL SUCCINATE ER 25 MG PO TB24: 25.0000 mg | ORAL_TABLET | Freq: Every day | ORAL | 2 refills | Status: DC

## 2017-11-10 NOTE — Telephone Encounter (Signed)
°*  STAT* If patient is at the pharmacy, call can be transferred to refill team.   1. Which medications need to be refilled? (please list name of each medication and dose if known) Metoprolol3  2. Which pharmacy/location (including street and city if local pharmacy) is medication to be sent to? [facility]  3. Do they need a 30 day or 90 day supply? 90 and refills

## 2017-11-30 DIAGNOSIS — F411 Generalized anxiety disorder: Secondary | ICD-10-CM | POA: Diagnosis not present

## 2017-11-30 DIAGNOSIS — F330 Major depressive disorder, recurrent, mild: Secondary | ICD-10-CM | POA: Diagnosis not present

## 2017-12-09 DIAGNOSIS — E041 Nontoxic single thyroid nodule: Secondary | ICD-10-CM | POA: Diagnosis not present

## 2017-12-09 DIAGNOSIS — E1140 Type 2 diabetes mellitus with diabetic neuropathy, unspecified: Secondary | ICD-10-CM | POA: Diagnosis not present

## 2017-12-09 DIAGNOSIS — Z79899 Other long term (current) drug therapy: Secondary | ICD-10-CM | POA: Diagnosis not present

## 2017-12-09 DIAGNOSIS — E782 Mixed hyperlipidemia: Secondary | ICD-10-CM | POA: Diagnosis not present

## 2017-12-09 DIAGNOSIS — E559 Vitamin D deficiency, unspecified: Secondary | ICD-10-CM | POA: Diagnosis not present

## 2018-01-07 DIAGNOSIS — R300 Dysuria: Secondary | ICD-10-CM | POA: Diagnosis not present

## 2018-01-07 DIAGNOSIS — M545 Low back pain: Secondary | ICD-10-CM | POA: Diagnosis not present

## 2018-01-07 DIAGNOSIS — E1140 Type 2 diabetes mellitus with diabetic neuropathy, unspecified: Secondary | ICD-10-CM | POA: Diagnosis not present

## 2018-01-07 DIAGNOSIS — Z7901 Long term (current) use of anticoagulants: Secondary | ICD-10-CM | POA: Diagnosis not present

## 2018-01-19 ENCOUNTER — Telehealth: Payer: Self-pay | Admitting: Cardiology

## 2018-01-19 DIAGNOSIS — Z79899 Other long term (current) drug therapy: Secondary | ICD-10-CM | POA: Diagnosis not present

## 2018-01-19 DIAGNOSIS — Z5181 Encounter for therapeutic drug level monitoring: Secondary | ICD-10-CM | POA: Diagnosis not present

## 2018-01-19 DIAGNOSIS — I959 Hypotension, unspecified: Secondary | ICD-10-CM | POA: Diagnosis not present

## 2018-01-19 DIAGNOSIS — J9811 Atelectasis: Secondary | ICD-10-CM | POA: Diagnosis not present

## 2018-01-19 DIAGNOSIS — M1711 Unilateral primary osteoarthritis, right knee: Secondary | ICD-10-CM | POA: Diagnosis not present

## 2018-01-19 DIAGNOSIS — R000 Tachycardia, unspecified: Secondary | ICD-10-CM | POA: Diagnosis not present

## 2018-01-19 DIAGNOSIS — I4891 Unspecified atrial fibrillation: Secondary | ICD-10-CM | POA: Diagnosis not present

## 2018-01-19 DIAGNOSIS — I517 Cardiomegaly: Secondary | ICD-10-CM | POA: Diagnosis not present

## 2018-01-19 DIAGNOSIS — R350 Frequency of micturition: Secondary | ICD-10-CM | POA: Diagnosis not present

## 2018-01-19 DIAGNOSIS — R300 Dysuria: Secondary | ICD-10-CM | POA: Diagnosis not present

## 2018-01-19 DIAGNOSIS — I480 Paroxysmal atrial fibrillation: Secondary | ICD-10-CM | POA: Diagnosis not present

## 2018-01-19 DIAGNOSIS — I498 Other specified cardiac arrhythmias: Secondary | ICD-10-CM | POA: Diagnosis not present

## 2018-01-19 DIAGNOSIS — I499 Cardiac arrhythmia, unspecified: Secondary | ICD-10-CM | POA: Diagnosis not present

## 2018-01-19 DIAGNOSIS — Z96651 Presence of right artificial knee joint: Secondary | ICD-10-CM | POA: Diagnosis not present

## 2018-01-19 DIAGNOSIS — I4581 Long QT syndrome: Secondary | ICD-10-CM | POA: Diagnosis not present

## 2018-01-19 DIAGNOSIS — R931 Abnormal findings on diagnostic imaging of heart and coronary circulation: Secondary | ICD-10-CM | POA: Diagnosis not present

## 2018-01-19 DIAGNOSIS — R0902 Hypoxemia: Secondary | ICD-10-CM | POA: Diagnosis not present

## 2018-01-19 DIAGNOSIS — E876 Hypokalemia: Secondary | ICD-10-CM | POA: Diagnosis not present

## 2018-01-19 NOTE — Telephone Encounter (Signed)
New message      [facility] Pre-operative Risk Assessment    Request for surgical clearance:  1. What type of surgery is being performed? Total knee replacement  2. When is this surgery scheduled? 02/15/2018  3. What type of clearance is required (medical clearance vs. Pharmacy clearance to hold med vs. Both)? both  4. Are there any medications that need to be held prior to surgery and how long?warfarin, 5 days   5. Practice name and name of physician performing surgery? [facility], Dr. Jacqulynn Cadet   6. What is your office phone number [phone]   7.   What is your office fax number [phone]  8.   Anesthesia type (None, local, MAC, general) ? MAC and regional    Maryjane Hurter 01/19/2018, 2:54 PM  _________________________________________________________________   (provider comments below)

## 2018-01-19 NOTE — Telephone Encounter (Signed)
Patient has followup with Dr. Ellyn Hack on 02/02/2018 for followup. Please change followup to preop clearance as well.   She will need to contact her PCP's coumadin clinic to manage coumadin prior to surgery.

## 2018-01-26 DIAGNOSIS — E876 Hypokalemia: Secondary | ICD-10-CM | POA: Diagnosis not present

## 2018-01-26 DIAGNOSIS — Z6837 Body mass index (BMI) 37.0-37.9, adult: Secondary | ICD-10-CM | POA: Diagnosis not present

## 2018-01-26 DIAGNOSIS — Z79899 Other long term (current) drug therapy: Secondary | ICD-10-CM | POA: Diagnosis not present

## 2018-01-26 DIAGNOSIS — N390 Urinary tract infection, site not specified: Secondary | ICD-10-CM | POA: Diagnosis not present

## 2018-02-02 ENCOUNTER — Encounter: Payer: Self-pay | Admitting: Cardiology

## 2018-02-02 ENCOUNTER — Ambulatory Visit (INDEPENDENT_AMBULATORY_CARE_PROVIDER_SITE_OTHER): Payer: Medicare Other | Admitting: Cardiology

## 2018-02-02 ENCOUNTER — Encounter (INDEPENDENT_AMBULATORY_CARE_PROVIDER_SITE_OTHER): Payer: Self-pay

## 2018-02-02 VITALS — BP 116/78 | HR 108 | Ht 71.0 in | Wt 263.8 lb

## 2018-02-02 DIAGNOSIS — Z87898 Personal history of other specified conditions: Secondary | ICD-10-CM

## 2018-02-02 DIAGNOSIS — I482 Chronic atrial fibrillation, unspecified: Secondary | ICD-10-CM | POA: Diagnosis not present

## 2018-02-02 DIAGNOSIS — I878 Other specified disorders of veins: Secondary | ICD-10-CM | POA: Diagnosis not present

## 2018-02-02 DIAGNOSIS — I4891 Unspecified atrial fibrillation: Secondary | ICD-10-CM

## 2018-02-02 DIAGNOSIS — I5032 Chronic diastolic (congestive) heart failure: Secondary | ICD-10-CM

## 2018-02-02 DIAGNOSIS — E876 Hypokalemia: Secondary | ICD-10-CM | POA: Diagnosis not present

## 2018-02-02 DIAGNOSIS — Z01810 Encounter for preprocedural cardiovascular examination: Secondary | ICD-10-CM | POA: Insufficient documentation

## 2018-02-02 MED ORDER — SPIRONOLACTONE 25 MG PO TABS: 12.5000 mg | ORAL_TABLET | Freq: Every day | ORAL | 3 refills | Status: DC

## 2018-02-02 MED ORDER — AMIODARONE HCL 200 MG PO TABS: ORAL_TABLET | ORAL | 0 refills | Status: DC

## 2018-02-02 MED ORDER — POTASSIUM CHLORIDE CRYS ER 20 MEQ PO TBCR: 100.0000 meq | EXTENDED_RELEASE_TABLET | Freq: Every day | ORAL | 0 refills | Status: DC

## 2018-02-02 MED ORDER — MAGNESIUM OXIDE 400 MG PO TABS: 400.0000 mg | ORAL_TABLET | Freq: Three times a day (TID) | ORAL | 0 refills | Status: AC

## 2018-02-02 NOTE — Assessment & Plan Note (Signed)
On supplementation.  Again depleted by diuretic.  Hopefully a potassium sparing diuretic will help.  Take additional supplementation tablet on Zaroxolyn days.

## 2018-02-02 NOTE — Assessment & Plan Note (Addendum)
She tolerated her right knee surgery very well and I have no doubt that she will tolerate the left knee surgery well.  There is concern of her EKG that was A. fib RVR.  Her baseline rates are in the 90s since we backed backed off on a beta-blocker.  This should not prevent her from having surgery.  She is totally asymptomatic.  There was some ST segment changes but without any anginal symptoms.  Was probably more related to electrolyte abnormalities.  Because now they are totally gone.  I would not do any ischemic evaluation because she is had a full ischemic evaluation.  She just had an echocardiogram that was normal.  Her major risk factors are related to her diabetes and electrolyte abnormalities.  Would be fine to stop warfarin 5-7 days preop (this will be managed by her PCP who manages her warfarin).  Using amiodarone perioperatively for rate/rhythm control.  If this works, may continue long-term.  Recommendation: Would feel comfortable proceeding with her surgery from a cardiac standpoint.  Should be stabilized on warfarin.  Would not do any further cardiac evaluation at this time.

## 2018-02-02 NOTE — Patient Instructions (Signed)
Medication Instructions:  TAKE THIS MEDICATION  IN PREPARATION FOR SURGERY FOR RATE CONTROL. AMIODARONE 400 MG  TWICE A DAY FOR 2 DAYS AND THEN  AMIODARONE 200 MG TWICE A DAY FOR 2 DAYS AND THEN AMIODARONE 200 MG DAILY  UNTIL SURGERY  TAKE 2 DAYS AFTER SURGERY THEN STOP MEDICATION.  -- START SPIRONOLACTONE 12.5 MG ( 1/2 TABLET OF 25 MG ) DAILY  EXCEPT THE DAYS YOU TAKE METOLAZONE ( ZARAXOLYN) THEN TAKE AN EXTRA POTASSIUM TABLET  AND EXTRA MAGNESIUM OXIDE TABLET THOSE DAYS.  If you need a refill on your cardiac medications before your next appointment, please call your pharmacy.   Lab work:  CMP MAGNESIUM If you have labs (blood work) drawn today and your tests are completely normal, you will receive your results only by: Marland Kitchen MyChart Message (if you have MyChart) OR . A paper copy in the mail If you have any lab test that is abnormal or we need to change your treatment, we will call you to review the results.  Testing/Procedures: NOT NEEDED  Follow-Up: At [facility], you and your health needs are our priority.  As part of our continuing mission to provide you with exceptional heart care, we have created designated Provider Care Teams.  These Care Teams include your primary Cardiologist (physician) and Advanced Practice Providers (APPs -  Physician Assistants and Nurse Practitioners) who all work together to provide you with the care you need, when you need it. You will need a follow up appointment in 6 months  MAY/JUNE 2020.  Please call our office 2 months in advance to schedule this appointment.  You may see Glenetta Hew, MD or one of the following Advanced Practice Providers on your designated Care Team:   Rosaria Ferries, PA-C . Jory Sims, DNP, ANP  Any Other Special Instructions Will Be Listed Below (If Applicable).  YOU ARE CLEARED FOR SURGERY - TOTAL KNEE SURGREY

## 2018-02-02 NOTE — Assessment & Plan Note (Addendum)
Remains on pretty high dose supplementation with 5 x 20 mg and K-Dur tablets daily.  We will add spironolactone 12.5 mg daily on her non-Zaroxolyn days.  On the Zaroxolyn days she will take extra potassium.  Eat more bananas and tomatoes  Check chemistry panel today.  This will be made in the main issues around her surgery.

## 2018-02-02 NOTE — Assessment & Plan Note (Signed)
Continues to be in A. fib.  Her beta-blocker dose has been reduced down to 25 mg and as such her heart rate has seemed to increase on recent visits.  At this point, especially for her upcoming surgery I think we need to just get the heart rate under control.  For fear of making her blood pressure worse, I do not want to increase the metoprolol back so we will use a temporary course of amiodarone (would be reluctant to use other antiarrhythmics or digoxin because of her issues with hypomagnesemia and hypokalemia).  It is okay to stop warfarin for her knee surgery.  For her knee surgery starting now we will do a short amiodarone load and continue until 1 or 2 days postop: AMIODARONE 400 MG  TWICE A DAY FOR 2 DAYS AND THEN  AMIODARONE 200 MG TWICE A DAY FOR 2 DAYS AND THEN AMIODARONE 200 MG DAILY  UNTIL SURGERY  TAKE 2 DAYS AFTER SURGERY THEN STOP MEDICATION.

## 2018-02-02 NOTE — Assessment & Plan Note (Signed)
Still borderline diagnosis probably more related to OHS and venous stasis.  Edema well controlled. She remains on stable dose of Lasix with 2 days a week Zaroxolyn and that gives her edema well controlled.  Unfortunately she has issues with hypokalemia and hypomagnesemia.  Plan: We will add spironolactone 12.5 mg on the non-Zaroxolyn days.  On Zaroxolyn days she will take additional supplemental dose of potassium and magnesium. She is on low-dose Toprol with borderline blood pressure so no further antihypertensive agents.

## 2018-02-02 NOTE — Assessment & Plan Note (Signed)
Chronic swelling well-controlled now.  Continue support stockings.

## 2018-02-02 NOTE — Progress Notes (Signed)
PCP: Janine Limbo, PA-C  Clinic Note: Chief Complaint  Patient presents with  . Pre-op Exam    Concern for abnormal EKG/A. fib  . Atrial Fibrillation    Borderline rate control,  . Edema    Well-controlled    HPI: Donna [name] is a 67 y.o. female with a PMH notable for longstanding chronic-persistent A. fib, chronic lower extremity edema (related to venous stasis and OSA/OHS  -> essentially negative cardiac evaluation).who presents today for 22-monthfollow-up and preoperative cardiovascular clearance for knee surgery.  NDanyiais on her fourth cardiologist --> initially given a diagnosis of congestive heart failure despite having had a essentially normal echocardiographic and cardiac catheterization evaluations.  She has OSA and likely OHS and likely chronic venous stasis with chronic lower extremity edema.  When I first met her, she was on a very confusing diuretic regimen which we have simplified for her and now her edema is well controlled. -->  The only issue is that she is plagued with hypokalemia and hypomagnesemia requiring significant supplementation.  She is wheelchair-bound with bilateral knee arthritis, but has had her right knee TKA and is now pending left TKA. (Right knee TKA ([facility] December 2018)  Donna [name] was last seen on August 06, 2017--> again had a smile on her face.  Excited and having had 1 knee surgery done and hopes to having the second knee done..  Edema much better controlled with no significant dyspnea or cardiac symptoms. -->  Was actually seen by LKerin Ransomin post hospital follow-up on August 20.  This is follow-up from an episode of "syncope " --> thought to be related to dehydration.  Diuretic reduced.  (Now only using Zaroxolyn 2 days a week and reduced dose of Lasix daily.  Recent Hospitalizations:   ([facility] ER visit 01/19/2018 -noted tachycardia - > was actually there for pre-op evaluation & EKG noted A fib RVR rate of 150 bpm with diffuse ST segment  changes --> NOTABLY WAS TOTALLY ASYMPTOMATIC..  Was found to have some electrolyte abnormalities (hypokalemia with potassium 2.9 as well as hypomagnesemia-1.7)-- > she was diagnosed with possible UTI (initially sent home with cefuroxime that was subsequently changed to a different antibiotic but she has not continued).  UTI was thought to be the culprit for her abnormalities.   Studies Personally Reviewed - (if available, images/films reviewed: From Epic Chart or Care Everywhere)  2D Echo September 03, 2017: Mild LVH with normal EF 55-60%.  Unable to fully assess wall motion, but appear to be normal.  Unable to determine diastolic function because of A. fib.  Mild to moderate MAC with mild mitral stenosis (mean gradient 9 mmHg).  Moderate LA dilation.  CVP estimated 3 mmHg.  No obvious evidence of pulmonary hypertension.  Interval History: NTamsenpresents today again noting that she is completely stable from a cardiac standpoint.  She is just in tears because she is concerned that her knee surgery will be further delayed.  She was able to get a date scheduled in December even 13th of the 16th, but because of her recent ER visit is concerned that this will cause delay.  Prior to that she was told that she had to wait till April.  She is very much wanting to get his knee done because the other leg feels so much better. She was totally asymptomatic when she went to the emergency room.  Her heart rate was only 150 beats a minute which is just about 12 beats  a minute faster than it is today.  She did not have any chest pain or pressure no dyspnea. She has had negative ischemic evaluation and has not had any ischemic symptoms of chest tightness pressure with rest or exertion. She is actually doing much better with reduced dose of Lasix and the edema is extremely well controlled with support stockings and her intermittent Zaroxolyn plus Lasix. She is also lost about 20+ pounds since July.  May get a conscious effort  with diet despite having an exercise.  She denies any PND orthopnea despite having edema that is well controlled.  No sensation of irregular heartbeats palpitations.  She does not even notice that her heart rate is going fast.  No syncope or near syncope.  No TIA or amaurosis fugax. She remains on warfarin without any bleeding issues.  1 of the issues with her is hypo-kalemia and hypomagnesemia.  She has been taking significant amount of supplementation.  No claudication.  ROS: A comprehensive was performed. Review of Systems  Constitutional: Positive for weight loss.  HENT: Negative for congestion and nosebleeds.   Respiratory: Positive for shortness of breath (Pretty much at baseline improved).   Cardiovascular: Positive for leg swelling (Very much improved.  Just noticed that her skin is dry.Marland Kitchen).  Gastrointestinal: Negative for blood in stool and melena.  Genitourinary: Negative for dysuria and hematuria.       Has a hard time controlling her bladder -especially because she is on diuretic.   Musculoskeletal: Positive for joint pain (Hoping to get t left knee hat done soon).  Neurological: Positive for dizziness. Negative for focal weakness.  Psychiatric/Behavioral: Negative for depression. The patient is nervous/anxious (Well-controlled).        Very tearful here today because of fear that her surgery will be delayed  All other systems reviewed and are negative.   I have reviewed and (if needed) personally updated the patient's problem list, medications, allergies, past medical and surgical history, social and family history.   Past Medical History:  Diagnosis Date  . CHF (congestive heart failure) ([place])   . Chronic /permanent atrial fibrillation ([place]) 2005   On Warfarin (follwed @ [facility]); Rate controlled - On BB  . Chronic diastolic heart failure, NYHA class 2 ([place])    Updated by A. fib, hypertensive heart disease and obesity; EDP was only 7 by cardiac  catheterization in September 2017  . Diabetic lumbosacral plexopathy ([place])   . Fibromyalgia   . Generalized anxiety disorder   . Hypokalemia 09/03/2017  . Incontinence   . Major depressive disorder   . OSA on CPAP   . Restless leg syndrome   . Syncope 09/03/2017  . Type II diabetes mellitus ([place])     Past Surgical History:  Procedure Laterality Date  . CARDIAC CATHETERIZATION  11/09/2015   [facility]: Nonocclusive CAD. EF 55%. EDP 7 mmHg.  Marland Kitchen CHOLECYSTECTOMY OPEN    . JOINT REPLACEMENT    . KNEE ARTHROSCOPY Right   . MASS EXCISION Left    sarcoma - shoulder  . SHOULDER OPEN ROTATOR CUFF REPAIR Bilateral   . TONSILLECTOMY    . TOTAL HIP ARTHROPLASTY Right 2015  . TOTAL HIP REVISION Right 2015  . TOTAL KNEE ARTHROPLASTY Right   . TRANSTHORACIC ECHOCARDIOGRAM  11/2013; 02/2016   a) [facility]: with Definity:  EF 60-65%. Mod LA dilation. Mild AoV sclerosis. Normal RHP. Indeterminate LV filling pressures. b) [facility]: Normal cavity size. EF 55-60%.no RWMA, mild AoV sclerosis (  R cusp), Mod MAC. Mild biAtrial dilation.  . TRANSTHORACIC ECHOCARDIOGRAM  08/2017    Mild LVH with normal EF 55-60%.  Unable to fully assess wall motion, but appear to be normal.  Unable to determine diastolic function because of A. fib.  Mild to moderate MAC with mild mitral stenosis (mean gradient 9 mmHg).  Moderate LA dilation.  CVP estimated 3 mmHg.  No obvious evidence of pulmonary hypertension.  Marland Kitchen VAGINAL HYSTERECTOMY      Current Meds  Medication Sig  . acetaminophen (TYLENOL) 500 MG tablet Take 500 mg by mouth every 6 (six) hours as needed for mild pain.   Marland Kitchen ALPRAZolam (XANAX) 0.25 MG tablet Take 1 tablet (0.25 mg total) by mouth 3 (three) times daily.  . busPIRone (BUSPAR) 10 MG tablet Take 10 mg by mouth 2 (two) times daily.   . Cholecalciferol (VITAMIN D) 2000 units tablet Take 4,000 Units by mouth daily.   . Dulaglutide (TRULICITY) 1.5 KX/3.8HW SOPN Inject 1.5 mg into the skin every  Tuesday.  . ezetimibe (ZETIA) 10 MG tablet Take 10 mg by mouth at bedtime.   . ferrous sulfate 325 (65 FE) MG tablet Take 325 mg by mouth See admin instructions. Take one tablet (325 mg) by mouth on Sunday, Monday, Wednesday and Friday mornings.  . furosemide (LASIX) 40 MG tablet Take 40 mg by mouth daily.  Marland Kitchen gabapentin (NEURONTIN) 600 MG tablet Take 600 mg by mouth 3 (three) times daily.  Marland Kitchen guaiFENesin (MUCINEX) 600 MG 12 hr tablet Take 600 mg by mouth 2 (two) times daily as needed for cough or to loosen phlegm.   . insulin detemir (LEVEMIR) 100 UNIT/ML injection Inject 0.4 mLs (40 Units total) into the skin at bedtime.  . insulin lispro (HUMALOG KWIKPEN) 100 UNIT/ML KiwkPen Inject 6-14 Units into the skin See admin instructions. Inject 6-14 units subcutaneously before each meal per sliding scale: CBG 201-250 6 units, 251-300 8 units, 301-350 10 units, 351-400 12 units, over 400 14 units and call MD  . lidocaine (LIDODERM) 5 % Place 1 patch onto the skin daily as needed (right hip pain). Remove & Discard patch within 12 hours or as directed by MD   . magnesium oxide (MAG-OX) 400 MG tablet Take 1 tablet (400 mg total) by mouth 3 (three) times daily. ALSO TAKE A EXTRA 400 MG TABLET ON THE DAYS YOU TAKE ZAROXOLYN TABLET  . Melatonin 5 MG CAPS Take 5 mg by mouth at bedtime.  . metolazone (ZAROXOLYN) 10 MG tablet Take 10 mg by mouth daily.  . metoprolol succinate (TOPROL-XL) 25 MG 24 hr tablet Take 1 tablet (25 mg total) by mouth daily. Take with or immediately following a meal.  . miconazole (MICOTIN) 2 % powder Apply 1 application topically See admin instructions. Apply topically to abdominal folds twice daily  . ondansetron (ZOFRAN) 4 MG tablet Take 1 tablet (4 mg total) by mouth every 6 (six) hours as needed for nausea.  . OxyCODONE HCl, Abuse Deter, (OXAYDO) 5 MG TABA Take 1 tablet by mouth 3 (three) times daily as needed (pain). (Patient taking differently: Take 1 tablet by mouth 3 (three) times  daily as needed (moderate pain). )  . potassium chloride SA (K-DUR,KLOR-CON) 20 MEQ tablet Take 5 tablets (100 mEq total) by mouth daily. TAKE AN EXTRA 20 MEQ ON THE DAYS YOU TAKE ZAROXLYN  . pramipexole (MIRAPEX) 0.125 MG tablet Take 0.125 mg by mouth daily.  Marland Kitchen senna-docusate (SENOKOT-S) 8.6-50 MG tablet Take 1 tablet by mouth  at bedtime as needed for mild constipation.  . sertraline (ZOLOFT) 100 MG tablet Take 100 mg by mouth daily.  . Sulfamethoxazole-Trimethoprim (BACTRIM PO) Take 80 mg by mouth daily.  Marland Kitchen tiZANidine (ZANAFLEX) 4 MG tablet Take 4 mg by mouth 3 (three) times daily as needed for muscle spasms.  . Turmeric 500 MG CAPS Take 500 mg by mouth daily.   . VENLAFAXINE HCL ER PO Take 100 mg by mouth daily with breakfast.   . vitamin C (ASCORBIC ACID) 500 MG tablet Take 500 mg by mouth daily.  Marland Kitchen warfarin (COUMADIN) 7.5 MG tablet Take 7.5 mg by mouth as directed. Takes 10 mg every Tuesday  . [DISCONTINUED] magnesium oxide (MAG-OX) 400 MG tablet Take 400 mg by mouth 3 (three) times daily.   . [DISCONTINUED] potassium chloride SA (K-DUR,KLOR-CON) 20 MEQ tablet Take 1 tablet (20 mEq total) by mouth daily. (Patient taking differently: Take 100 mEq by mouth daily. )    Allergies  Allergen Reactions  . Bee Venom Hives    Takes benadryl  . Other Other (See Comments)    Raw foods with seeds give her "boils".  Avoids raw strawberries, blueberries. Tolerates cooked fruits, tomato sauce, bread/grains with seeds. [facility] 10/17/13 Berries with seeds Allergy to glutamine-C-quercet-selen-brom per [facility] 09/25/17  . Alteplase Rash  . Cefepime Rash  . Fentanyl Other (See Comments)    Hallucinations, syncope    . Metformin And Related Diarrhea    Social History   Tobacco Use  . Smoking status: Never Smoker  . Smokeless tobacco: Never Used  Substance Use Topics  . Alcohol use: No  . Drug use: No   Social History   Social History Narrative  . Not on file    family history includes Heart  disease in her mother; Hypertension in her mother; Leukemia in her son.  Wt Readings from Last 3 Encounters:  02/02/18 263 lb 12.8 oz (119.7 kg)  10/23/17 285 lb (129.3 kg)  09/28/17 288 lb 2.3 oz (130.7 kg)    PHYSICAL EXAM BP 116/78   Pulse (!) 108   Ht _0  (1.803 m)   Wt 263 lb 12.8 oz (119.7 kg)   BMI 36.79 kg/m  Physical Exam  Constitutional: She is oriented to person, place, and time. No distress.  Morbidly obese woman -but with notable weight loss.  Sitting in wheelchair.  Relatively well-groomed.Marland Kitchen  HENT:  Head: Normocephalic and atraumatic.  Neck: Normal range of motion. Neck supple. No hepatojugular reflux (Unable to assess due to body habitus.) and no JVD present. Carotid bruit is not present.  Cardiovascular: Normal rate, S1 normal and S2 normal. An irregularly irregular rhythm present. PMI is not displaced (Unable to palpate). Exam reveals distant heart sounds and decreased pulses (Palpable pedal). Exam reveals no gallop, no S4 and no friction rub.  Pulmonary/Chest: Effort normal. No respiratory distress. She has no wheezes. She exhibits no tenderness (Some costochondral tenderness).  Somewhat distant breath sounds with mild interstitial sounds but no wheezes rales or rhonchi.  Abdominal: Soft. Bowel sounds are normal. She exhibits no distension. There is no tenderness. There is no rebound.  Obese.  Unable to assess HSM  Musculoskeletal: Normal range of motion. She exhibits edema (Trace-1+ bilateral with mild venous stasis changes.  Support stockings in place).  Neurological: She is alert and oriented to person, place, and time.  Psychiatric: She has a normal mood and affect. Her behavior is normal. Judgment and thought content normal.  Vitals reviewed.   Adult ECG  Report  Rate: 103;  Rhythm: atrial fibrillation and Baseline artifact, but no obvious ST-T wave changes.;   Narrative Interpretation: Still relatively normal EKG. -the ST segment depressions noted on her  preop EKG are now no longer present.   Other studies Reviewed: Additional studies/ records that were reviewed today include:  Recent Labs:    Jul 30, 2017: TC 182, TG 106, LDL 113 HDL 48.  A1c 7.3.  BUN/CR 17/1.06.  TSH 0.91.  January 19, 2018 notable chemistries sodium 137, potassium 2.7, chloride 96, BUN/creatinine 27/1.0.  Glucose 255.  Magnesium 1.7.;  CBC-W 11.5, H/H 12.8/39.6, platelet 287  UA/culture from November 2019 showed E. coli resistant to cefuroxime, cefazolin, Unasyn and Augmentin as well as nitrofurantoin, Bactrim and tetracycline.  Susceptible to cefepime, Zosyn, Cipro and ertapenem/imipenem/meropenem as well as tobramycin..  ASSESSMENT / PLAN: Problem List Items Addressed This Visit    RESOLVED: Atrial fibrillation with RVR (HCC) - Primary   Relevant Medications   metolazone (ZAROXOLYN) 10 MG tablet   spironolactone (ALDACTONE) 25 MG tablet   amiodarone (PACERONE) 200 MG tablet   Other Relevant Orders   EKG 12-Lead   Magnesium   Comprehensive metabolic panel   Chronic atrial fibrillation ([place]): CHA2DS2-VASc Score 5; on Warfarin (Chronic)    Continues to be in A. fib.  Her beta-blocker dose has been reduced down to 25 mg and as such her heart rate has seemed to increase on recent visits.  At this point, especially for her upcoming surgery I think we need to just get the heart rate under control.  For fear of making her blood pressure worse, I do not want to increase the metoprolol back so we will use a temporary course of amiodarone (would be reluctant to use other antiarrhythmics or digoxin because of her issues with hypomagnesemia and hypokalemia).  It is okay to stop warfarin for her knee surgery.  For her knee surgery starting now we will do a short amiodarone load and continue until 1 or 2 days postop: AMIODARONE 400 MG  TWICE A DAY FOR 2 DAYS AND THEN  AMIODARONE 200 MG TWICE A DAY FOR 2 DAYS AND THEN AMIODARONE 200 MG DAILY  UNTIL SURGERY  TAKE 2 DAYS  AFTER SURGERY THEN STOP MEDICATION.      Relevant Medications   metolazone (ZAROXOLYN) 10 MG tablet   spironolactone (ALDACTONE) 25 MG tablet   amiodarone (PACERONE) 200 MG tablet   Other Relevant Orders   EKG 12-Lead   Chronic atrial fibrillation with RVR (Chronic)   Relevant Medications   metolazone (ZAROXOLYN) 10 MG tablet   spironolactone (ALDACTONE) 25 MG tablet   amiodarone (PACERONE) 200 MG tablet   Other Relevant Orders   EKG 12-Lead   Chronic diastolic heart failure (HCC) (Chronic)    Still borderline diagnosis probably more related to OHS and venous stasis.  Edema well controlled. She remains on stable dose of Lasix with 2 days a week Zaroxolyn and that gives her edema well controlled.  Unfortunately she has issues with hypokalemia and hypomagnesemia.  Plan: We will add spironolactone 12.5 mg on the non-Zaroxolyn days.  On Zaroxolyn days she will take additional supplemental dose of potassium and magnesium. She is on low-dose Toprol with borderline blood pressure so no further antihypertensive agents.      Relevant Medications   metolazone (ZAROXOLYN) 10 MG tablet   spironolactone (ALDACTONE) 25 MG tablet   amiodarone (PACERONE) 200 MG tablet   History of seizures   Hypokalemia (Chronic)  Remains on pretty high dose supplementation with 5 x 20 mg and K-Dur tablets daily.  We will add spironolactone 12.5 mg daily on her non-Zaroxolyn days.  On the Zaroxolyn days she will take extra potassium.  Eat more bananas and tomatoes  Check chemistry panel today.  This will be made in the main issues around her surgery.      Hypomagnesemia (Chronic)    On supplementation.  Again depleted by diuretic.  Hopefully a potassium sparing diuretic will help.  Take additional supplementation tablet on Zaroxolyn days.      Relevant Orders   Magnesium   Comprehensive metabolic panel   Pre-operative cardiovascular examination    She tolerated her right knee surgery very well and I  have no doubt that she will tolerate the left knee surgery well.  There is concern of her EKG that was A. fib RVR.  Her baseline rates are in the 90s since we backed backed off on a beta-blocker.  This should not prevent her from having surgery.  She is totally asymptomatic.  There was some ST segment changes but without any anginal symptoms.  Was probably more related to electrolyte abnormalities.  Because now they are totally gone.  I would not do any ischemic evaluation because she is had a full ischemic evaluation.  She just had an echocardiogram that was normal.  Her major risk factors are related to her diabetes and electrolyte abnormalities.  Would be fine to stop warfarin 5-7 days preop (this will be managed by her PCP who manages her warfarin).  Using amiodarone perioperatively for rate/rhythm control.  If this works, may continue long-term.  Recommendation: Would feel comfortable proceeding with her surgery from a cardiac standpoint.  Should be stabilized on warfarin.  Would not do any further cardiac evaluation at this time.      Preop cardiovascular exam   Relevant Orders   EKG 12-Lead   Magnesium   Comprehensive metabolic panel   Venous stasis of both lower extremities (Chronic)    Chronic swelling well-controlled now.  Continue support stockings.      Relevant Medications   metolazone (ZAROXOLYN) 10 MG tablet   spironolactone (ALDACTONE) 25 MG tablet   amiodarone (PACERONE) 200 MG tablet     Complicated decision-making planning.  Significant discussion with question answering.  I spent 45 to 50 minutes with the patient and charting. > 50% of this time was spent in direct patient counseling discussing plans for pre-/perioperative management.   Current medicines are reviewed at length with the patient today.  (+/- concerns) n/a The following changes have been made:  n/a  Patient Instructions  Medication Instructions:  TAKE THIS MEDICATION  IN PREPARATION FOR SURGERY FOR  RATE CONTROL. AMIODARONE 400 MG  TWICE A DAY FOR 2 DAYS AND THEN  AMIODARONE 200 MG TWICE A DAY FOR 2 DAYS AND THEN AMIODARONE 200 MG DAILY  UNTIL SURGERY  TAKE 2 DAYS AFTER SURGERY THEN STOP MEDICATION.  -- START SPIRONOLACTONE 12.5 MG ( 1/2 TABLET OF 25 MG ) DAILY  EXCEPT THE DAYS YOU TAKE METOLAZONE ( ZARAXOLYN) THEN TAKE AN EXTRA POTASSIUM TABLET  AND EXTRA MAGNESIUM OXIDE TABLET THOSE DAYS.  If you need a refill on your cardiac medications before your next appointment, please call your pharmacy.   Lab work:  CMP MAGNESIUM If you have labs (blood work) drawn today and your tests are completely normal, you will receive your results only by: Marland Kitchen MyChart Message (if you have MyChart) OR . A  paper copy in the mail If you have any lab test that is abnormal or we need to change your treatment, we will call you to review the results.  Testing/Procedures: NOT NEEDED  Follow-Up: At [facility], you and your health needs are our priority.  As part of our continuing mission to provide you with exceptional heart care, we have created designated Provider Care Teams.  These Care Teams include your primary Cardiologist (physician) and Advanced Practice Providers (APPs -  Physician Assistants and Nurse Practitioners) who all work together to provide you with the care you need, when you need it. You will need a follow up appointment in 6 months  MAY/JUNE 2020.  Please call our office 2 months in advance to schedule this appointment.  You may see Glenetta Hew, MD or one of the following Advanced Practice Providers on your designated Care Team:   Rosaria Ferries, PA-C . Jory Sims, DNP, ANP  Any Other Special Instructions Will Be Listed Below (If Applicable).  YOU ARE CLEARED FOR SURGERY - TOTAL KNEE SURGREY      Studies Ordered:   Orders Placed This Encounter  Procedures  . Magnesium  . Comprehensive metabolic panel  . EKG 12-Lead      Glenetta Hew, M.D., M.S. Interventional  Cardiologist   Pager # [phone] Phone # [phone] [address]. [place], [place] [address]   Thank you for choosing [facility] at [facility]!!

## 2018-02-03 ENCOUNTER — Telehealth: Payer: Self-pay | Admitting: Cardiology

## 2018-02-03 LAB — COMPREHENSIVE METABOLIC PANEL
A/G RATIO: 1.6 (ref 1.2–2.2)
ALT: 16 IU/L (ref 0–32)
AST: 25 IU/L (ref 0–40)
Albumin: 4.3 g/dL (ref 3.6–4.8)
Alkaline Phosphatase: 117 IU/L (ref 39–117)
BUN/Creatinine Ratio: 16 (ref 12–28)
BUN: 17 mg/dL (ref 8–27)
Bilirubin Total: 0.4 mg/dL (ref 0.0–1.2)
CALCIUM: 9.7 mg/dL (ref 8.7–10.3)
CO2: 28 mmol/L (ref 20–29)
Chloride: 90 mmol/L — ABNORMAL LOW (ref 96–106)
Creatinine, Ser: 1.05 mg/dL — ABNORMAL HIGH (ref 0.57–1.00)
GFR calc Af Amer: 64 mL/min/{1.73_m2} (ref >59)
GFR calc non Af Amer: 55 mL/min/{1.73_m2} — ABNORMAL LOW (ref >59)
Globulin, Total: 2.7 g/dL (ref 1.5–4.5)
Glucose: 257 mg/dL — ABNORMAL HIGH (ref 65–99)
Potassium: 3.3 mmol/L — ABNORMAL LOW (ref 3.5–5.2)
Sodium: 138 mmol/L (ref 134–144)
Total Protein: 7 g/dL (ref 6.0–8.5)

## 2018-02-03 LAB — MAGNESIUM: Magnesium: 1.7 mg/dL (ref 1.6–2.3)

## 2018-02-03 NOTE — Telephone Encounter (Signed)
Patient seen yesterday. She is calling in regards to instructions concerning amiodarone for surgery, which was been cancelled. Please advise

## 2018-02-03 NOTE — Telephone Encounter (Signed)
New Message   Patient is calling because she was in the office on 12/3 to be seen for a surgical clearance. The surgery was cancelled but she wants to know does she still need to take the medication that was given to her to take for the surgery. Please call to discuss.

## 2018-02-04 NOTE — Telephone Encounter (Signed)
Why was it cancelled?   Hopefully not for Afib.  If cancelled - then no real reason to take Amiodarone.   However - I would still recommend this for when she is due for surgery.  Glenetta Hew, MD

## 2018-02-04 NOTE — Telephone Encounter (Signed)
Why do not we plan on having her come in to see me about a week or 2 before her surgery.  We can discuss amiodarone at that time if she is having rapid A. fib.  If not we will not do it.  I think using it perioperatively may be a not a bad idea.  The potassium and magnesium is because her recent levels were still a little low.  This is based on her lab results. As far as the spironolactone dosing with additional potassium and magnesium being given on the Zaroxolyn days, we should continue.  Hopefully we can titrate up her spironolactone and that will help her potassium levels.  Glenetta Hew, MD

## 2018-02-04 NOTE — Telephone Encounter (Signed)
Spoke to patient . She was tearful . Patient states the surgeon has postpone surgery until 06/07/2018.  Per patient , they wanted her to see an endocrinologist about controlling diabetes, potassium and magnesium levels.  Patient  Aware of  Dr Ellyn Hack comments. She has several 1- The question is when would she start taking amiodarone in preparation for surgery  2- what instruction about  Taking extra potassium and magnesium on days when she takes spironolactone ( does that still stand since she is not taking amiodarone 3- does she need another appointment prior to surgery in April  2020.  Patient aware will defer to Dr harding and cal he back

## 2018-02-09 NOTE — Telephone Encounter (Signed)
Patient called back.  She would like to speak to nurse.

## 2018-02-09 NOTE — Telephone Encounter (Signed)
Spoke with the pt and her surgery is scheduled for 06/07/18 and per Dr. Allison Quarry note.. I scheduled the pt to see him 05/12/18.Marland Kitchen Pt is seeing her PMD tomorrow, Dr. Alvis Lemmings O'Buck and she is planning to have her labs redrawn then.

## 2018-02-10 DIAGNOSIS — M545 Low back pain: Secondary | ICD-10-CM | POA: Diagnosis not present

## 2018-02-10 DIAGNOSIS — R300 Dysuria: Secondary | ICD-10-CM | POA: Diagnosis not present

## 2018-02-10 DIAGNOSIS — E1140 Type 2 diabetes mellitus with diabetic neuropathy, unspecified: Secondary | ICD-10-CM | POA: Diagnosis not present

## 2018-02-10 DIAGNOSIS — Z7901 Long term (current) use of anticoagulants: Secondary | ICD-10-CM | POA: Diagnosis not present

## 2018-02-10 DIAGNOSIS — I4891 Unspecified atrial fibrillation: Secondary | ICD-10-CM | POA: Diagnosis not present

## 2018-02-11 ENCOUNTER — Telehealth: Payer: Self-pay | Admitting: *Deleted

## 2018-02-11 DIAGNOSIS — Z79899 Other long term (current) drug therapy: Secondary | ICD-10-CM

## 2018-02-11 DIAGNOSIS — E876 Hypokalemia: Secondary | ICD-10-CM

## 2018-02-11 NOTE — Telephone Encounter (Signed)
-----   Message from Leonie Man, MD sent at 02/04/2018  3:10 PM EST ----- Magnesium level is on the low side of normal - recommend taking additional supplement tab x 4 days. Potassium is a little low --> recommend taking additional supplement tab x 4 days.  Kidney function and liver function looks good.  Glenetta Hew, MD  Please forward to PCP: Janine Limbo

## 2018-02-11 NOTE — Telephone Encounter (Signed)
SPOKE TO PATIENT - RESULTS GIVEN AWARE TO INCREASE MEDICATION FOR 4 DAYS AND IN 1 WEEK RECHECK LAB . PATIENT STATES SHE WILL TRY TO FIND A OFFICE CLOSE HER TO HAVE LA WORK DONE.  MAILED LABSLIP  ROUTED TO PRIMARY

## 2018-02-22 DIAGNOSIS — E876 Hypokalemia: Secondary | ICD-10-CM | POA: Diagnosis not present

## 2018-02-22 DIAGNOSIS — Z79899 Other long term (current) drug therapy: Secondary | ICD-10-CM | POA: Diagnosis not present

## 2018-02-22 LAB — BASIC METABOLIC PANEL
BUN/Creatinine Ratio: 23 (ref 12–28)
BUN: 16 mg/dL (ref 8–27)
CO2: 36 mmol/L — ABNORMAL HIGH (ref 20–29)
Calcium: 10 mg/dL (ref 8.7–10.3)
Chloride: 91 mmol/L — ABNORMAL LOW (ref 96–106)
Creatinine, Ser: 0.69 mg/dL (ref 0.57–1.00)
GFR calc Af Amer: 104 mL/min/{1.73_m2} (ref >59)
GFR calc non Af Amer: 90 mL/min/{1.73_m2} (ref >59)
Glucose: 297 mg/dL — ABNORMAL HIGH (ref 65–99)
Potassium: 3 mmol/L — ABNORMAL LOW (ref 3.5–5.2)
Sodium: 139 mmol/L (ref 134–144)

## 2018-02-22 LAB — MAGNESIUM: Magnesium: 1.4 mg/dL — ABNORMAL LOW (ref 1.6–2.3)

## 2018-02-23 ENCOUNTER — Telehealth: Payer: Self-pay | Admitting: *Deleted

## 2018-02-23 DIAGNOSIS — E876 Hypokalemia: Secondary | ICD-10-CM

## 2018-02-23 NOTE — Telephone Encounter (Signed)
Lab results from 12/23-potassium 3.0.   Reviewed with pharmD-advised to increase potassium to 6 tablets daily (120 meq) x 5 days and repeat labs in 1 week.    Patient aware and verbalized understanding.  Order placed for repeat lab work.

## 2018-03-02 DIAGNOSIS — E876 Hypokalemia: Secondary | ICD-10-CM | POA: Diagnosis not present

## 2018-03-02 LAB — BASIC METABOLIC PANEL
BUN/Creatinine Ratio: 20 (ref 12–28)
BUN: 19 mg/dL (ref 8–27)
CO2: 29 mmol/L (ref 20–29)
Calcium: 9.9 mg/dL (ref 8.7–10.3)
Chloride: 93 mmol/L — ABNORMAL LOW (ref 96–106)
Creatinine, Ser: 0.95 mg/dL (ref 0.57–1.00)
GFR calc non Af Amer: 62 mL/min/{1.73_m2} (ref >59)
GFR, EST AFRICAN AMERICAN: 72 mL/min/{1.73_m2} (ref >59)
Glucose: 395 mg/dL — ABNORMAL HIGH (ref 65–99)
Potassium: 3.3 mmol/L — ABNORMAL LOW (ref 3.5–5.2)
Sodium: 138 mmol/L (ref 134–144)

## 2018-03-04 ENCOUNTER — Telehealth: Payer: Self-pay | Admitting: Cardiology

## 2018-03-04 DIAGNOSIS — E876 Hypokalemia: Secondary | ICD-10-CM

## 2018-03-04 NOTE — Telephone Encounter (Signed)
New message     Pt wants to discuss blood test results

## 2018-03-04 NOTE — Telephone Encounter (Signed)
Routed to DOD to review.   K+ had been low on 12/23 labs (3.0) and patient was advised then to increase potassium supplement to 6 tablets daily (120 meq) x 5 days and recheck in 1 week.   Please see labs dated 12/31 and advise

## 2018-03-04 NOTE — Telephone Encounter (Signed)
I would increase aldactone to 25 mg daily. Continue current potassium dose. Repeat BMET in 2 weeks.  Ima Hafner [place] MD, [facility]

## 2018-03-04 NOTE — Telephone Encounter (Signed)
Spoke with patient of Dr. Ellyn Hack who reports she HAS NOT been taking spironolactone 12.5mg  daily as directed. She thought when her amiodarone was put on hold d/t her surgery being cancelled, she thought this also meant her spironolactone as well. Explained that amiodarone and spironolactone are 2 different medications that do different things for her heart.   I read her MD commends dated 12/5 "Why do not we plan on having her come in to see me about a week or 2 before her surgery.  We can discuss amiodarone at that time if she is having rapid A. fib.  If not we will not do it.  I think using it perioperatively may be a not a bad idea.The potassium and magnesium is because her recent levels were still a little low.  This is based on her lab results. As far as the spironolactone dosing with additional potassium and magnesium being given on the Zaroxolyn days, we should continue.  Hopefully we can titrate up her spironolactone and that will help her potassium levels."   Explained that she should START spironolactone at dose previously prescribed (12.5mg ) and repeat BMET in 2 weeks.

## 2018-03-05 NOTE — Telephone Encounter (Signed)
Patient called in requesting to know what her specific K+ values are. Provided those from 12/31 and previous lab test for comparison.

## 2018-03-24 ENCOUNTER — Telehealth: Payer: Self-pay | Admitting: Cardiology

## 2018-03-24 DIAGNOSIS — E876 Hypokalemia: Secondary | ICD-10-CM | POA: Diagnosis not present

## 2018-03-24 LAB — BASIC METABOLIC PANEL
BUN/Creatinine Ratio: 22 (ref 12–28)
BUN: 18 mg/dL (ref 8–27)
CO2: 32 mmol/L — ABNORMAL HIGH (ref 20–29)
Calcium: 9.8 mg/dL (ref 8.7–10.3)
Chloride: 92 mmol/L — ABNORMAL LOW (ref 96–106)
Creatinine, Ser: 0.82 mg/dL (ref 0.57–1.00)
GFR calc Af Amer: 86 mL/min/{1.73_m2} (ref >59)
GFR calc non Af Amer: 74 mL/min/{1.73_m2} (ref >59)
Glucose: 261 mg/dL — ABNORMAL HIGH (ref 65–99)
POTASSIUM: 2.9 mmol/L — AB (ref 3.5–5.2)
SODIUM: 136 mmol/L (ref 134–144)

## 2018-03-24 NOTE — Telephone Encounter (Addendum)
CRITICAL VALUE STICKER  CRITICAL VALUE: Potassium 2.9  RECEIVER (on-site recipient of call): Corry Ihnen  DATE & TIME NOTIFIED: 03/24/2018; 1540  MESSENGER (representative from lab): Armond Hang, [place]  MD NOTIFIED: Buford Dresser, MD  TIME OF NOTIFICATION: [phone]  RESPONSE: Advise pt to hold metolazone and if pt has already taken 132mEq dose of potassium, then pt should take additional dose of potassium tonight. Also advised that pt report to [facility] office tomorrow for BMET

## 2018-03-24 NOTE — Telephone Encounter (Signed)
Spoke with pt and advised pt of DOD-[name], MD recommendation to hold metolazone. Pt states she already took the metolazone in the AM today. Advised pt to continue to hold the metolazone for now because, per Dr. [name] Gave, the medication will lower her potassium level. Pt states she has already taken her prescribed potassium dose today. Advised pt to take additional 100 mEq tonight and return to office tomorrow for lab work. Pt agreeable with this and verbalized understanding

## 2018-03-25 DIAGNOSIS — E876 Hypokalemia: Secondary | ICD-10-CM | POA: Diagnosis not present

## 2018-03-26 ENCOUNTER — Telehealth: Payer: Self-pay | Admitting: Cardiology

## 2018-03-26 LAB — BASIC METABOLIC PANEL
BUN/Creatinine Ratio: 21 (ref 12–28)
BUN: 22 mg/dL (ref 8–27)
CO2: 28 mmol/L (ref 20–29)
Calcium: 9.9 mg/dL (ref 8.7–10.3)
Chloride: 93 mmol/L — ABNORMAL LOW (ref 96–106)
Creatinine, Ser: 1.03 mg/dL — ABNORMAL HIGH (ref 0.57–1.00)
GFR calc Af Amer: 65 mL/min/{1.73_m2} (ref >59)
GFR calc non Af Amer: 56 mL/min/{1.73_m2} — ABNORMAL LOW (ref >59)
Glucose: 218 mg/dL — ABNORMAL HIGH (ref 65–99)
Potassium: 3.9 mmol/L (ref 3.5–5.2)
Sodium: 144 mmol/L (ref 134–144)

## 2018-03-26 NOTE — Telephone Encounter (Signed)
Called patient back, she states she meant to ask this morning when you called her with the results of her lab work. But she would like to know if Dr.[name] would like her stay off her metolazone medication or restart?  Thanks!

## 2018-03-26 NOTE — Telephone Encounter (Signed)
F/U Message        Patient is needing more information about the current Rx, she states she received two set of instructions and would like to know if she should put them together. Pls call and advise.

## 2018-03-29 NOTE — Telephone Encounter (Signed)
I think using Metolazone as needed for worsening swelling in addition to other diuretic is warranted. Recommend taking additional potassium with each dose of metolazone. Also - wanted spironolactone restarted - will help with potassium. Glenetta Hew, MD

## 2018-03-29 NOTE — Telephone Encounter (Signed)
Please advise Dr.Harding on this patient's metolazone. Thank you!   Will route to MD and nurse.

## 2018-03-29 NOTE — Telephone Encounter (Signed)
Called patient, advised of MD message. Patient verbalized understanding.  

## 2018-03-29 NOTE — Telephone Encounter (Signed)
Hello--I would have her discuss this with Dr. Ellyn Hack. The metolazone is likely making her potassium low, but it may be needed to keep the fluid off. Thanks!

## 2018-04-01 DIAGNOSIS — I482 Chronic atrial fibrillation, unspecified: Secondary | ICD-10-CM | POA: Diagnosis not present

## 2018-04-01 DIAGNOSIS — E041 Nontoxic single thyroid nodule: Secondary | ICD-10-CM | POA: Diagnosis not present

## 2018-04-01 DIAGNOSIS — Z7901 Long term (current) use of anticoagulants: Secondary | ICD-10-CM | POA: Diagnosis not present

## 2018-04-01 DIAGNOSIS — E1169 Type 2 diabetes mellitus with other specified complication: Secondary | ICD-10-CM | POA: Diagnosis not present

## 2018-04-01 DIAGNOSIS — N390 Urinary tract infection, site not specified: Secondary | ICD-10-CM | POA: Diagnosis not present

## 2018-04-01 DIAGNOSIS — Z79899 Other long term (current) drug therapy: Secondary | ICD-10-CM | POA: Diagnosis not present

## 2018-04-01 DIAGNOSIS — E1140 Type 2 diabetes mellitus with diabetic neuropathy, unspecified: Secondary | ICD-10-CM | POA: Diagnosis not present

## 2018-04-01 DIAGNOSIS — M545 Low back pain: Secondary | ICD-10-CM | POA: Diagnosis not present

## 2018-04-01 DIAGNOSIS — E559 Vitamin D deficiency, unspecified: Secondary | ICD-10-CM | POA: Diagnosis not present

## 2018-04-05 ENCOUNTER — Telehealth: Payer: Self-pay | Admitting: Cardiology

## 2018-04-05 NOTE — Telephone Encounter (Signed)
She should be fine taking spironolactone.  Glenetta Hew, MD

## 2018-04-05 NOTE — Telephone Encounter (Signed)
Called patient, she had lab results completed by PCP and they are not in Epic, but wanted Dr.Harding to be aware.  Potassium- 4.6 A1C- 8.5 Magnesium- 1.6 LDL- 1.04  Patient will fax over results once she receives them. But would like Dr.Harding to advise if she should continue her Spironolactone medication or stop it?  Thank you!

## 2018-04-05 NOTE — Telephone Encounter (Signed)
New Message        Patient is calling today to share results from a program that she is participating in and would like to share the results. Pls call and advise.

## 2018-04-06 NOTE — Telephone Encounter (Signed)
Called patient, advised of MD message. Patient verbalized understanding.  

## 2018-04-22 DIAGNOSIS — Z7901 Long term (current) use of anticoagulants: Secondary | ICD-10-CM | POA: Diagnosis not present

## 2018-05-10 DIAGNOSIS — Z7901 Long term (current) use of anticoagulants: Secondary | ICD-10-CM | POA: Diagnosis not present

## 2018-05-12 ENCOUNTER — Ambulatory Visit (INDEPENDENT_AMBULATORY_CARE_PROVIDER_SITE_OTHER): Payer: Medicare Other | Admitting: Cardiology

## 2018-05-12 ENCOUNTER — Encounter: Payer: Self-pay | Admitting: Cardiology

## 2018-05-12 ENCOUNTER — Other Ambulatory Visit: Payer: Self-pay

## 2018-05-12 VITALS — BP 98/72 | HR 113 | Ht 71.0 in

## 2018-05-12 DIAGNOSIS — Z01810 Encounter for preprocedural cardiovascular examination: Secondary | ICD-10-CM

## 2018-05-12 DIAGNOSIS — E876 Hypokalemia: Secondary | ICD-10-CM

## 2018-05-12 DIAGNOSIS — I482 Chronic atrial fibrillation, unspecified: Secondary | ICD-10-CM

## 2018-05-12 DIAGNOSIS — I5032 Chronic diastolic (congestive) heart failure: Secondary | ICD-10-CM | POA: Diagnosis not present

## 2018-05-12 DIAGNOSIS — I878 Other specified disorders of veins: Secondary | ICD-10-CM | POA: Diagnosis not present

## 2018-05-12 MED ORDER — AMIODARONE HCL 200 MG PO TABS: ORAL_TABLET | ORAL | 0 refills | Status: DC

## 2018-05-12 MED ORDER — SPIRONOLACTONE 25 MG PO TABS: 25.0000 mg | ORAL_TABLET | Freq: Every day | ORAL | 3 refills | Status: DC

## 2018-05-12 MED ORDER — AMIODARONE HCL 200 MG PO TABS: 200.0000 mg | ORAL_TABLET | Freq: Every day | ORAL | 3 refills | Status: DC

## 2018-05-12 NOTE — Patient Instructions (Addendum)
Medication Instructions:  START AMIODARONE 400 MG ( 2 TABLETS) TWICE A DAY FOR 7 DAYS THEN  TAKE 200 MG ( 1 TABLET) TWICE A DAY FOR 7 DAYS THEN TAKE 200 MG  ( 1 TABLET ) ONE TABLET DAILY   WEAN  OFF TAKING METOPROLOL- THE 2ND DAY OF AMIODARONE USE  CHANGE TO TAKING METOPROLOL EVERY OTHER DAY  AND ON THE 6TH DAY -STOP TAKING IT ALL TOGETHER.  AFTER YOU STOP TAKING THE METOPROLOL  INCREASE SPIRONOLACTONE TO 25 MG  ( 1 TABLET ) DAILY.   If you need a refill on your cardiac medications before your next appointment, please call your pharmacy.   Lab work: Not needed If you have labs (blood work) drawn today and your tests are completely normal, you will receive your results only by: Marland Kitchen MyChart Message (if you have MyChart) OR . A paper copy in the mail If you have any lab test that is abnormal or we need to change your treatment, we will call you to review the results.  Testing/Procedures: Not needed  Follow-Up: At [facility], you and your health needs are our priority.  As part of our continuing mission to provide you with exceptional heart care, we have created designated Provider Care Teams.  These Care Teams include your primary Cardiologist (physician) and Advanced Practice Providers (APPs -  Physician Assistants and Nurse Practitioners) who all work together to provide you with the care you need, when you need it. . Your physician recommends that you schedule a follow-up appointment in June 2020 [place]. .   Any Other Special Instructions Will Be Listed Below (If Applicable).

## 2018-05-12 NOTE — Progress Notes (Signed)
PCP: Janine Limbo, PA-C  Ortho: Dr. Eben Burow ([facility])  Clinic Note: Chief Complaint  Patient presents with  . Follow-up    Still has not yet had surgery  . Atrial Fibrillation  . Edema    Complicated by hypokalemia and low blood pressure    HPI: Donna [name] is a 68 y.o. female with a PMH below who presents today for 3 month f/u for Permanent Afib along with LE Edema (venous insufficiency) .  Donna [name] is now on her 33th Cardiologist --> has carried a Dx of "[place]" despite an essentially normal Cardiac Evaluation - relatively normal Echo & Cath.    She has chronic LE Edema - mostly related to venous stasis combined with a component of OSA/OHS (no PND or orthopnea going along with edema.  She does had Chronic / Persistent Atrial Fibrillation (rate controlled & on warfarin  She has been plaqued with Hypokalemia & Hypomagnesemia requiring significant supplementation.   She is wheelchair-bound with bilateral knee arthritis, but has had her right knee TKA and is now pending left TKA. (Right knee TKA [facility]) December 2018) -- unfortunately, he L knee TKA has been delayed (most recently related to surreptitious finding of Afib RVR   Donna [name] was last seen in Dec 2019 - in hopes for pre-op evaluation prior to knee Sgx that got cancelled b/c she was in Afib RVR at her pre-op evaluation (was asymptomatic).  Had lost ~20lb since July.    -- was told to start Spironolactone (for diuresis & potassium boost) on non Zaroxolyn days; told to take additional K-dur on Zaroxolyn days.  -- also plan was to use Amiodarone (plans for rate/rhythm control peri-operatively) -- decided to stop since surgery delayed.   Recent Hospitalizations: none --> Had some issues with her surgery being delayed.  Did not know what to do as far as amiodarone.  Also did not continue with spironolactone.  Has had hypokalemia followed most recently with a of 4.6.  Studies Personally Reviewed - (if  available, images/films reviewed: From Epic Chart or Care Everywhere)  None  Interval History: Donna [name] returns today pretty much stable from a cardiac standpoint.  Her edema is little worse than it has been.  She is now having to get her diabetes morning control before she has her surgery so nothing can happen until least April where she is reevaluated.  She is getting very frustrated because she is not able to do anything.  She cannot stand up on her own because the right knee is not yet strong enough and the left knee is too weak to hold her.  It takes several people to have her stand up.  She therefore did not weigh today. Today she and her husband are both wearing masks because they have upper respiratory tract flexion symptoms.  No fevers or chills, just coughing and wheezing. She really has not noticed any change in her A. fib.  Rate is usually relatively elevated. Her swelling is pretty stable taking furosemide standing with Zaroxolyn may be once a week.  She unfortunately has not really been taking her Spironolactone very routinely because is hard for her to cut it in half.  The plan was for her to take it on the non-Zaroxolyn days.  She is otherwise stable from car standpoint with no chest pain or pressure with rest or exertion.  She does not do much and therefore does really notice any exertional dyspnea.  No PND orthopnea with  stable edema. No sensation of rapid irregular heartbeats palpitations unless he is struggling to do something that she notes her heart rate going fast.  No syncope/near syncope or TIA shows amaurosis fugax. No bleeding issues on anticoagulation.   ROS: A comprehensive was performed. Review of Systems  Constitutional: Positive for malaise/fatigue (Does not really have much energy because she is not able to do much). Negative for chills and fever.  HENT: Positive for congestion. Negative for nosebleeds.   Respiratory: Positive for cough and wheezing. Negative for  sputum production.        Dealing with a current URI  Cardiovascular: Positive for leg swelling (Stable).  Gastrointestinal: Negative for abdominal pain, blood in stool, heartburn and melena.  Genitourinary: Negative for hematuria.  Musculoskeletal: Positive for joint pain (Significant left knee pain).  Neurological: Positive for dizziness and focal weakness (Legs). Negative for headaches.  Psychiatric/Behavioral: Positive for depression (Starting to get quite tired and fatigued with the idea of not having any done to her knee.  She is fit to be tied about not being on a walk.).  All other systems reviewed and are negative.   I have reviewed and (if needed) personally updated the patient's problem list, medications, allergies, past medical and surgical history, social and family history.   Past Medical History:  Diagnosis Date  . CHF (congestive heart failure) ([place])   . Chronic /permanent atrial fibrillation ([place]) 2005   On Warfarin (follwed @ [facility]); Rate controlled - On BB  . Chronic diastolic heart failure, NYHA class 2 ([place])    Updated by A. fib, hypertensive heart disease and obesity; EDP was only 7 by cardiac catheterization in September 2017  . Diabetic lumbosacral plexopathy ([place])   . Fibromyalgia   . Generalized anxiety disorder   . Hypokalemia 09/03/2017  . Incontinence   . Major depressive disorder   . OSA on CPAP   . Restless leg syndrome   . Syncope 09/03/2017  . Type II diabetes mellitus ([place])     Past Surgical History:  Procedure Laterality Date  . CARDIAC CATHETERIZATION  11/09/2015   [facility]: Nonocclusive CAD. EF 55%. EDP 7 mmHg.  Marland Kitchen CHOLECYSTECTOMY OPEN    . JOINT REPLACEMENT    . KNEE ARTHROSCOPY Right   . MASS EXCISION Left    sarcoma - shoulder  . SHOULDER OPEN ROTATOR CUFF REPAIR Bilateral   . TONSILLECTOMY    . TOTAL HIP ARTHROPLASTY Right 2015  . TOTAL HIP REVISION Right 2015  . TOTAL KNEE ARTHROPLASTY Right   .  TRANSTHORACIC ECHOCARDIOGRAM  11/2013; 02/2016   a) [facility]: with Definity:  EF 60-65%. Mod LA dilation. Mild AoV sclerosis. Normal RHP. Indeterminate LV filling pressures. b) [facility]: Normal cavity size. EF 55-60%.no RWMA, mild AoV sclerosis (R cusp), Mod MAC. Mild biAtrial dilation.  . TRANSTHORACIC ECHOCARDIOGRAM  08/2017    Mild LVH with normal EF 55-60%.  Unable to fully assess wall motion, but appear to be normal.  Unable to determine diastolic function because of A. fib.  Mild to moderate MAC with mild mitral stenosis (mean gradient 9 mmHg).  Moderate LA dilation.  CVP estimated 3 mmHg.  No obvious evidence of pulmonary hypertension.  Marland Kitchen VAGINAL HYSTERECTOMY      Current Meds  Medication Sig  . acetaminophen (TYLENOL) 500 MG tablet Take 500 mg by mouth every 6 (six) hours as needed for mild pain.   Marland Kitchen ALPRAZolam (XANAX) 0.25 MG tablet Take 1 tablet (0.25 mg  total) by mouth 3 (three) times daily.  . busPIRone (BUSPAR) 10 MG tablet Take 10 mg by mouth 2 (two) times daily.   . Cholecalciferol (VITAMIN D) 2000 units tablet Take 4,000 Units by mouth daily.   . ciprofloxacin (CIPRO) 250 MG tablet   . Dulaglutide (TRULICITY) 1.5 YT/0.3TW SOPN Inject 1.5 mg into the skin every Tuesday.  . ezetimibe (ZETIA) 10 MG tablet Take 10 mg by mouth at bedtime.   . ferrous sulfate 325 (65 FE) MG tablet Take 325 mg by mouth See admin instructions. Take one tablet (325 mg) by mouth on Sunday, Monday, Wednesday and Friday mornings.  . furosemide (LASIX) 40 MG tablet Take 40 mg by mouth daily.  Marland Kitchen gabapentin (NEURONTIN) 600 MG tablet Take 600 mg by mouth 3 (three) times daily.  Marland Kitchen guaiFENesin (MUCINEX) 600 MG 12 hr tablet Take 600 mg by mouth 2 (two) times daily as needed for cough or to loosen phlegm.   . insulin detemir (LEVEMIR) 100 UNIT/ML injection Inject 0.4 mLs (40 Units total) into the skin at bedtime.  . insulin lispro (HUMALOG KWIKPEN) 100 UNIT/ML KiwkPen Inject 6-14 Units into the skin See admin  instructions. Inject 6-14 units subcutaneously before each meal per sliding scale: CBG 201-250 6 units, 251-300 8 units, 301-350 10 units, 351-400 12 units, over 400 14 units and call MD  . lidocaine (LIDODERM) 5 % Place 1 patch onto the skin daily as needed (right hip pain). Remove & Discard patch within 12 hours or as directed by MD   . magnesium oxide (MAG-OX) 400 MG tablet Take 1 tablet (400 mg total) by mouth 3 (three) times daily. ALSO TAKE A EXTRA 400 MG TABLET ON THE DAYS YOU TAKE ZAROXOLYN TABLET  . Melatonin 5 MG CAPS Take 5 mg by mouth at bedtime.  . metolazone (ZAROXOLYN) 10 MG tablet Take 10 mg by mouth as needed.   . miconazole (MICOTIN) 2 % powder Apply 1 application topically See admin instructions. Apply topically to abdominal folds twice daily  . omeprazole (PRILOSEC) 20 MG capsule Take 20 mg by mouth daily.  . ondansetron (ZOFRAN) 4 MG tablet Take 1 tablet (4 mg total) by mouth every 6 (six) hours as needed for nausea.  . OxyCODONE HCl, Abuse Deter, (OXAYDO) 5 MG TABA Take 1 tablet by mouth 3 (three) times daily as needed (pain). (Patient taking differently: Take 1 tablet by mouth 3 (three) times daily as needed (moderate pain). )  . potassium chloride SA (K-DUR,KLOR-CON) 20 MEQ tablet Take 5 tablets (100 mEq total) by mouth daily. TAKE AN EXTRA 20 MEQ ON THE DAYS YOU TAKE ZAROXLYN  . pramipexole (MIRAPEX) 0.125 MG tablet Take 0.125 mg by mouth daily.  . sertraline (ZOLOFT) 100 MG tablet Take 100 mg by mouth daily.  . Sulfamethoxazole-Trimethoprim (BACTRIM PO) Take 80 mg by mouth daily.  Marland Kitchen tiZANidine (ZANAFLEX) 4 MG tablet Take 4 mg by mouth 3 (three) times daily as needed for muscle spasms.  . Turmeric 500 MG CAPS Take 500 mg by mouth daily.   . VENLAFAXINE HCL ER PO Take 100 mg by mouth daily with breakfast.   . vitamin C (ASCORBIC ACID) 500 MG tablet Take 500 mg by mouth daily.  Marland Kitchen warfarin (COUMADIN) 7.5 MG tablet Take 7.5 mg by mouth as directed. Takes 10 mg every Tuesday  .  [DISCONTINUED] metoprolol succinate (TOPROL-XL) 25 MG 24 hr tablet Take 1 tablet (25 mg total) by mouth daily. Take with or immediately following a meal.  . [  DISCONTINUED] spironolactone (ALDACTONE) 25 MG tablet Take 0.5 tablets (12.5 mg total) by mouth daily.    Allergies  Allergen Reactions  . Bee Venom Hives    Takes benadryl  . Other Other (See Comments)    Raw foods with seeds give her "boils".  Avoids raw strawberries, blueberries. Tolerates cooked fruits, tomato sauce, bread/grains with seeds. [facility] 10/17/13 Berries with seeds Allergy to glutamine-C-quercet-selen-brom per [facility] 09/25/17  . Alteplase Rash  . Cefepime Rash  . Fentanyl Other (See Comments)    Hallucinations, syncope    . Metformin And Related Diarrhea    Social History   Tobacco Use  . Smoking status: Never Smoker  . Smokeless tobacco: Never Used  Substance Use Topics  . Alcohol use: No  . Drug use: No   Social History   Social History Narrative  . Not on file    family history includes Heart disease in her mother; Hypertension in her mother; Leukemia in her son.  Wt Readings from Last 3 Encounters:  02/02/18 263 lb 12.8 oz (119.7 kg)  10/23/17 285 lb (129.3 kg)  09/28/17 288 lb 2.3 oz (130.7 kg)    PHYSICAL EXAM BP 98/72   Pulse (!) 113   Ht 5' 11"  (1.803 m)   SpO2 96%   BMI 36.79 kg/m  Physical Exam  Constitutional: She is oriented to person, place, and time. No distress (Just frustrated).  Well-groomed.  Chronically ill-appearing morbidly obese woman still in her wheelchair.  HENT:  Head: Normocephalic and atraumatic.  Wearing mask  Neck: Normal range of motion. Neck supple. No JVD present.  Cardiovascular: S1 normal and S2 normal. An irregularly irregular rhythm present. Tachycardia present. PMI is not displaced (Unable to palpate). Exam reveals distant heart sounds and decreased pulses (Go to palpate pedal pulses). Exam reveals no gallop and no friction rub.  No murmur heard.  Pulmonary/Chest: Effort normal and breath sounds normal. No respiratory distress. She has no wheezes. She has no rales.  Abdominal: Soft. Bowel sounds are normal. She exhibits no distension. There is no abdominal tenderness.  Musculoskeletal: Normal range of motion.        General: Edema (1-2+ stable bilateral) present.  Neurological: She is alert and oriented to person, place, and time.  Skin: She is not diaphoretic.  Psychiatric: She has a normal mood and affect. Her behavior is normal. Judgment and thought content normal.  Vitals reviewed.   Adult ECG Report  Rate: 113 ;  Rhythm: sinus arrhythmia and RVR.  Exclude inferior MI, age undetermined.  Also cannot exclude anterior MI, age undetermined.;   Narrative Interpretation: Relatively stable EKG   Other studies Reviewed: Additional studies/ records that were reviewed today include:  Recent Labs:   Lab Results  Component Value Date   K 3.9 03/25/2018   From PCP on April 01, 2018: Potassium 4.6.  Creatinine 0.92.  Hemoglobin 13.4 --TC 179, HDL 50, LDL 104, TG 127.   ASSESSMENT / PLAN: Problem List Items Addressed This Visit    Chronic atrial fibrillation ([place]): CHA2DS2-VASc Score 5; on Warfarin - Primary (Chronic)    At this point, with her blood pressure being relatively low, I will try to see if we can come off of metoprolol and then would choose to go ahead and use amiodarone now for rate control that would minimize hypotension concerns.  Unfortunately with a low-dose of Toprol, were not able to adequately control her rate..  See patient instructions for details on how to wean off of Toprol  and start amiodarone.  We will need to adjust warfarin dosing based on amiodarone.  Need to avoid supratherapeutic INR.      Relevant Medications   amiodarone (PACERONE) 200 MG tablet   spironolactone (ALDACTONE) 25 MG tablet   Other Relevant Orders   EKG 12-Lead (Completed)   Chronic atrial fibrillation with RVR (Chronic)    Relevant Medications   amiodarone (PACERONE) 200 MG tablet   spironolactone (ALDACTONE) 25 MG tablet   Other Relevant Orders   EKG 12-Lead (Completed)   Chronic diastolic heart failure (HCC) (Chronic)    Again borderline diagnosis of diastolic heart failure as most of her symptoms are related to right-sided issues versus even venous stasis.  Continue on current dose of Lasix with PRN Zaroxolyn, but will add full dose spironolactone as opposed to 1/2 tablet.      Relevant Medications   amiodarone (PACERONE) 200 MG tablet   spironolactone (ALDACTONE) 25 MG tablet   Other Relevant Orders   EKG 12-Lead (Completed)   Hypokalemia (Chronic)    We will continue to need potassium levels checked routinely.  She is on significant supplementation, hopefully as we increase her spironolactone dose this will minimize somewhat hypokalemia.  Taking additional dosing when she takes Zaroxolyn.  Combination of both hypokalemia and hypomagnesemia in the past make digoxin a very poor option for rate control.      Preop cardiovascular exam    No real reason to not have her surgery from a cardiac standpoint.  Even atrial fibrillation in the 1 teens is not overly symptomatic for her.  Hopefully amiodarone will help slow her down some, but mostly we would just need to hold warfarin for surgery. Would avoid excess fluid shifts.  No need for ischemic evaluation or additional echocardiographic evaluation.  Still okay to proceed with surgery without further cardiac evaluation.  She CONTINUES to be an INTERMEDIATE PATIENT for LOW RISK SURGERY.      Venous stasis of both lower extremities (Chronic)    Chronic lower extremity edema.  Continue to wear support stockings. Continue current dose of Lasix with as needed Zaroxolyn, but will add spironolactone to help alleviate the hypokalemia issue. -->  We will have her titrate up the spironolactone once metoprolol is fully weaned to avoid hypotension.      Relevant  Medications   amiodarone (PACERONE) 200 MG tablet   spironolactone (ALDACTONE) 25 MG tablet     I spent a total of 37mnutes with the patient and chart review. >  50% of the time was spent in direct patient consultation.   Current medicines are reviewed at length with the patient today.  (+/- concerns) was not sure what to do with spironolactone etc. The following changes have been made:  See below  Patient Instructions  Medication Instructions:  START AMIODARONE 400 MG ( 2 TABLETS) TWICE A DAY FOR 7 DAYS THEN  TAKE 200 MG ( 1 TABLET) TWICE A DAY FOR 7 DAYS THEN TAKE 200 MG  ( 1 TABLET ) ONE TABLET DAILY   WEAN  OFF TAKING METOPROLOL- THE 2ND DAY OF AMIODARONE USE  CHANGE TO TAKING METOPROLOL EVERY OTHER DAY  AND ON THE 6TH DAY -STOP TAKING IT ALL TOGETHER.  AFTER YOU STOP TAKING THE METOPROLOL  INCREASE SPIRONOLACTONE TO 25 MG  ( 1 TABLET ) DAILY.   If you need a refill on your cardiac medications before your next appointment, please call your pharmacy.   Lab work: Not needed If you have labs (blood  work) drawn today and your tests are completely normal, you will receive your results only by: Marland Kitchen MyChart Message (if you have MyChart) OR . A paper copy in the mail If you have any lab test that is abnormal or we need to change your treatment, we will call you to review the results.  Testing/Procedures: Not needed  Follow-Up: At [facility], you and your health needs are our priority.  As part of our continuing mission to provide you with exceptional heart care, we have created designated Provider Care Teams.  These Care Teams include your primary Cardiologist (physician) and Advanced Practice Providers (APPs -  Physician Assistants and Nurse Practitioners) who all work together to provide you with the care you need, when you need it. . Your physician recommends that you schedule a follow-up appointment in June 2020 [place]. .   Any Other Special Instructions Will Be  Listed Below (If Applicable).    Studies Ordered:   Orders Placed This Encounter  Procedures  . EKG 12-Lead      Glenetta Hew, M.D., M.S. Interventional Cardiologist   Pager # [phone] Phone # [phone] [address] [place], [place] [address]   Thank you for choosing Heartcare at [facility]!!

## 2018-05-14 ENCOUNTER — Other Ambulatory Visit: Payer: Self-pay | Admitting: Cardiology

## 2018-05-16 ENCOUNTER — Encounter: Payer: Self-pay | Admitting: Cardiology

## 2018-05-16 NOTE — Assessment & Plan Note (Addendum)
At this point, with her blood pressure being relatively low, I will try to see if we can come off of metoprolol and then would choose to go ahead and use amiodarone now for rate control that would minimize hypotension concerns.  Unfortunately with a low-dose of Toprol, were not able to adequately control her rate..  See patient instructions for details on how to wean off of Toprol and start amiodarone.  We will need to adjust warfarin dosing based on amiodarone.  Need to avoid supratherapeutic INR.

## 2018-05-16 NOTE — Assessment & Plan Note (Signed)
Chronic lower extremity edema.  Continue to wear support stockings. Continue current dose of Lasix with as needed Zaroxolyn, but will add spironolactone to help alleviate the hypokalemia issue. -->  We will have her titrate up the spironolactone once metoprolol is fully weaned to avoid hypotension.

## 2018-05-16 NOTE — Assessment & Plan Note (Signed)
No real reason to not have her surgery from a cardiac standpoint.  Even atrial fibrillation in the 1 teens is not overly symptomatic for her.  Hopefully amiodarone will help slow her down some, but mostly we would just need to hold warfarin for surgery. Would avoid excess fluid shifts.  No need for ischemic evaluation or additional echocardiographic evaluation.  Still okay to proceed with surgery without further cardiac evaluation.  She CONTINUES to be an INTERMEDIATE PATIENT for LOW RISK SURGERY.

## 2018-05-16 NOTE — Assessment & Plan Note (Signed)
Again borderline diagnosis of diastolic heart failure as most of her symptoms are related to right-sided issues versus even venous stasis.  Continue on current dose of Lasix with PRN Zaroxolyn, but will add full dose spironolactone as opposed to 1/2 tablet.

## 2018-05-16 NOTE — Assessment & Plan Note (Addendum)
We will continue to need potassium levels checked routinely.  She is on significant supplementation, hopefully as we increase her spironolactone dose this will minimize somewhat hypokalemia.  Taking additional dosing when she takes Zaroxolyn.  Combination of both hypokalemia and hypomagnesemia in the past make digoxin a very poor option for rate control.

## 2018-05-20 ENCOUNTER — Other Ambulatory Visit: Payer: Self-pay | Admitting: Cardiology

## 2018-05-31 DIAGNOSIS — F330 Major depressive disorder, recurrent, mild: Secondary | ICD-10-CM | POA: Diagnosis not present

## 2018-05-31 DIAGNOSIS — F411 Generalized anxiety disorder: Secondary | ICD-10-CM | POA: Diagnosis not present

## 2018-06-01 DIAGNOSIS — M1711 Unilateral primary osteoarthritis, right knee: Secondary | ICD-10-CM | POA: Diagnosis not present

## 2018-06-07 DIAGNOSIS — Z7901 Long term (current) use of anticoagulants: Secondary | ICD-10-CM | POA: Diagnosis not present

## 2018-06-09 DIAGNOSIS — R1310 Dysphagia, unspecified: Secondary | ICD-10-CM | POA: Diagnosis not present

## 2018-06-18 ENCOUNTER — Telehealth: Payer: Self-pay | Admitting: Cardiology

## 2018-06-18 NOTE — Telephone Encounter (Signed)
Maybe see if holding spironolactone x 4-5 days helps.   If not - then hold Amio x 5 days.   If no change in Sx - then restart meds  Glenetta Hew, MD

## 2018-06-18 NOTE — Telephone Encounter (Signed)
Pt says she is having sharp pains before urinating. Once she starts going, the pain goes away. Her PCP gave her two rounds of Pyridium, and has taken both already. She is having the pain again, and her PCP told her to get in touch with her Cardiologist

## 2018-06-18 NOTE — Telephone Encounter (Signed)
Called patient, she states that she seen her PCP due to having sharp pains when she starts to urinate. She was put on Pyridium for 4 days, and did not have problems for 1 week, then the pains came back, she gave another dose of Pyridium and advised patient if the pains came back to call our office - as patient has started new medication on 03/11- Spironolactone, and Amiodarone. PCP thinks it could be coming from this. Patient is on Cipro for chronic UTI, but states she has to do something the pain when she begins to urinate is awful. She states once the flow starts going the pain goes away, it only hurts when she is about to urinate. I advised I would route a message to PharmD and Lily for recommendations.   Thanks !

## 2018-06-18 NOTE — Telephone Encounter (Signed)
Called patient, advised of message from [place].  Patient verbalized understanding.  Will call back if Sx get better or worse.  Thanks!

## 2018-06-25 DIAGNOSIS — E1140 Type 2 diabetes mellitus with diabetic neuropathy, unspecified: Secondary | ICD-10-CM | POA: Diagnosis not present

## 2018-06-25 DIAGNOSIS — M545 Low back pain: Secondary | ICD-10-CM | POA: Diagnosis not present

## 2018-06-25 DIAGNOSIS — G629 Polyneuropathy, unspecified: Secondary | ICD-10-CM | POA: Diagnosis not present

## 2018-06-25 DIAGNOSIS — E785 Hyperlipidemia, unspecified: Secondary | ICD-10-CM | POA: Diagnosis not present

## 2018-06-25 DIAGNOSIS — I482 Chronic atrial fibrillation, unspecified: Secondary | ICD-10-CM | POA: Diagnosis not present

## 2018-06-25 DIAGNOSIS — Z79899 Other long term (current) drug therapy: Secondary | ICD-10-CM | POA: Diagnosis not present

## 2018-06-30 DIAGNOSIS — Z7901 Long term (current) use of anticoagulants: Secondary | ICD-10-CM | POA: Diagnosis not present

## 2018-06-30 DIAGNOSIS — N390 Urinary tract infection, site not specified: Secondary | ICD-10-CM | POA: Diagnosis not present

## 2018-06-30 DIAGNOSIS — Z6839 Body mass index (BMI) 39.0-39.9, adult: Secondary | ICD-10-CM | POA: Diagnosis not present

## 2018-06-30 DIAGNOSIS — L0390 Cellulitis, unspecified: Secondary | ICD-10-CM | POA: Diagnosis not present

## 2018-07-03 ENCOUNTER — Other Ambulatory Visit: Payer: Self-pay | Admitting: Cardiology

## 2018-07-03 NOTE — Telephone Encounter (Signed)
Amiodarone 200 mg refilled. 

## 2018-07-06 DIAGNOSIS — E785 Hyperlipidemia, unspecified: Secondary | ICD-10-CM | POA: Diagnosis not present

## 2018-07-06 DIAGNOSIS — Z7901 Long term (current) use of anticoagulants: Secondary | ICD-10-CM | POA: Diagnosis not present

## 2018-07-06 DIAGNOSIS — Z0000 Encounter for general adult medical examination without abnormal findings: Secondary | ICD-10-CM | POA: Diagnosis not present

## 2018-07-06 DIAGNOSIS — Z9181 History of falling: Secondary | ICD-10-CM | POA: Diagnosis not present

## 2018-07-06 DIAGNOSIS — Z1231 Encounter for screening mammogram for malignant neoplasm of breast: Secondary | ICD-10-CM | POA: Diagnosis not present

## 2018-07-06 DIAGNOSIS — N959 Unspecified menopausal and perimenopausal disorder: Secondary | ICD-10-CM | POA: Diagnosis not present

## 2018-07-06 DIAGNOSIS — Z6839 Body mass index (BMI) 39.0-39.9, adult: Secondary | ICD-10-CM | POA: Diagnosis not present

## 2018-07-30 DIAGNOSIS — E1140 Type 2 diabetes mellitus with diabetic neuropathy, unspecified: Secondary | ICD-10-CM | POA: Diagnosis not present

## 2018-07-30 DIAGNOSIS — I482 Chronic atrial fibrillation, unspecified: Secondary | ICD-10-CM | POA: Diagnosis not present

## 2018-07-30 DIAGNOSIS — M545 Low back pain: Secondary | ICD-10-CM | POA: Diagnosis not present

## 2018-07-30 DIAGNOSIS — G8929 Other chronic pain: Secondary | ICD-10-CM | POA: Diagnosis not present

## 2018-08-02 DIAGNOSIS — Z7901 Long term (current) use of anticoagulants: Secondary | ICD-10-CM | POA: Diagnosis not present

## 2018-08-10 ENCOUNTER — Telehealth: Payer: Self-pay

## 2018-08-10 NOTE — Telephone Encounter (Signed)
Virtual Visit Pre-Appointment Phone Call [place] PHONE  "(Name), I am calling you today to discuss your upcoming appointment. We are currently trying to limit exposure to the virus that causes COVID-19 by seeing patients at home rather than in the office."  1. "What is the BEST phone number to call the day of the visit?" - include this in appointment notes  2. "Do you have or have access to (through a family member/friend) a smartphone with video capability that we can use for your visit?" a. If yes - list this number in appt notes as "cell" (if different from BEST phone #) and list the appointment type as a VIDEO visit in appointment notes b. If no - list the appointment type as a PHONE visit in appointment notes  3. Confirm consent - "In the setting of the current Covid19 crisis, you are scheduled for a (phone or video) visit with your provider on (date) at (time).  Just as we do with many in-office visits, in order for you to participate in this visit, we must obtain consent.  If you'd like, I can send this to your mychart (if signed up) or email for you to review.  Otherwise, I can obtain your verbal consent now.  All virtual visits are billed to your insurance company just like a normal visit would be.  By agreeing to a virtual visit, we'd like you to understand that the technology does not allow for your provider to perform an examination, and thus may limit your provider's ability to fully assess your condition. If your provider identifies any concerns that need to be evaluated in person, we will make arrangements to do so.  Finally, though the technology is pretty good, we cannot assure that it will always work on either your or our end, and in the setting of a video visit, we may have to convert it to a phone-only visit.  In either situation, we cannot ensure that we have a secure connection.  Are you willing to proceed?" STAFF: Did the patient verbally acknowledge consent to telehealth  visit? Document YES/NO here:   4. Advise patient to be prepared - "Two hours prior to your appointment, go ahead and check your blood pressure, pulse, oxygen saturation, and your weight (if you have the equipment to check those) and write them all down. When your visit starts, your provider will ask you for this information. If you have an Apple Watch or Kardia device, please plan to have heart rate information ready on the day of your appointment. Please have a pen and paper handy nearby the day of the visit as well."  5. Give patient instructions for MyChart download to smartphone OR Doximity/Doxy.me as below if video visit (depending on what platform provider is using)  6. Inform patient they will receive a phone call 15 minutes prior to their appointment time (may be from unknown caller ID) so they should be prepared to answer    TELEPHONE CALL NOTE  Donna [name] Kneeland has been deemed a candidate for a follow-up tele-health visit to limit community exposure during the Covid-19 pandemic. I spoke with the patient via phone to ensure availability of phone/video source, confirm preferred email & phone number, and discuss instructions and expectations.  I reminded Donna [name] Laskin to be prepared with any vital sign and/or heart rhythm information that could potentially be obtained via home monitoring, at the time of her visit. I reminded Donna [name] Hlad to expect a phone  call prior to her visit.  [place] 08/10/2018 3:48 PM   INSTRUCTIONS FOR DOWNLOADING THE MYCHART APP TO SMARTPHONE  - The patient must first make sure to have activated MyChart and know their login information - If Apple, go to [employer] and type in MyChart in the search bar and download the app. If Android, ask patient to go to [employer] and type in [place] in the search bar and download the app. The app is free but as with any other app downloads, their phone may require them to verify saved payment information or  Apple/Android password.  - The patient will need to then log into the app with their MyChart username and password, and select [facility] as their healthcare provider to link the account. When it is time for your visit, go to the MyChart app, find appointments, and click Begin Video Visit. Be sure to Select Allow for your device to access the Microphone and Camera for your visit. You will then be connected, and your provider will be with you shortly.  **If they have any issues connecting, or need assistance please contact [facility] [phone])  **If using a computer, in order to ensure the best quality for their visit they will need to use either of the following Internet Browsers: [employer], or Google Chrome  IF USING DOXIMITY or DOXY.ME - The patient will receive a link just prior to their visit by text.     FULL LENGTH CONSENT FOR TELE-HEALTH VISIT   I hereby voluntarily request, consent and authorize [facility] and its employed or contracted physicians, physician assistants, nurse practitioners or other licensed health care professionals (the Practitioner), to provide me with telemedicine health care services (the "Services") as deemed necessary by the treating Practitioner. I acknowledge and consent to receive the Services by the Practitioner via telemedicine. I understand that the telemedicine visit will involve communicating with the Practitioner through live audiovisual communication technology and the disclosure of certain medical information by electronic transmission. I acknowledge that I have been given the opportunity to request an in-person assessment or other available alternative prior to the telemedicine visit and am voluntarily participating in the telemedicine visit.  I understand that I have the right to withhold or withdraw my consent to the use of telemedicine in the course of my care at any time, without affecting my right to future care or  treatment, and that the Practitioner or I may terminate the telemedicine visit at any time. I understand that I have the right to inspect all information obtained and/or recorded in the course of the telemedicine visit and may receive copies of available information for a reasonable fee.  I understand that some of the potential risks of receiving the Services via telemedicine include:  Marland Kitchen Delay or interruption in medical evaluation due to technological equipment failure or disruption; . Information transmitted may not be sufficient (e.g. poor resolution of images) to allow for appropriate medical decision making by the Practitioner; and/or  . In rare instances, security protocols could fail, causing a breach of personal health information.  Furthermore, I acknowledge that it is my responsibility to provide information about my medical history, conditions and care that is complete and accurate to the best of my ability. I acknowledge that Practitioner's advice, recommendations, and/or decision may be based on factors not within their control, such as incomplete or inaccurate data provided by me or distortions of diagnostic images or specimens that may result  from electronic transmissions. I understand that the practice of medicine is not an exact science and that Practitioner makes no warranties or guarantees regarding treatment outcomes. I acknowledge that I will receive a copy of this consent concurrently upon execution via email to the email address I last provided but may also request a printed copy by calling the office of [facility].    I understand that my insurance will be billed for this visit.   I have read or had this consent read to me. . I understand the contents of this consent, which adequately explains the benefits and risks of the Services being provided via telemedicine.  . I have been provided ample opportunity to ask questions regarding this consent and the Services and have had my  questions answered to my satisfaction. . I give my informed consent for the services to be provided through the use of telemedicine in my medical care  By participating in this telemedicine visit I agree to the above.

## 2018-08-11 ENCOUNTER — Telehealth: Payer: Self-pay | Admitting: Cardiology

## 2018-08-11 NOTE — Telephone Encounter (Signed)
No mychart, no smartphone, consent, pre reg complete 08/11/18 AF

## 2018-08-12 ENCOUNTER — Telehealth: Payer: Self-pay | Admitting: *Deleted

## 2018-08-12 ENCOUNTER — Encounter: Payer: Self-pay | Admitting: Cardiology

## 2018-08-12 ENCOUNTER — Telehealth (INDEPENDENT_AMBULATORY_CARE_PROVIDER_SITE_OTHER): Payer: Medicare Other | Admitting: Cardiology

## 2018-08-12 VITALS — BP 121/51 | HR 88 | Ht 70.0 in | Wt 273.0 lb

## 2018-08-12 DIAGNOSIS — Z7901 Long term (current) use of anticoagulants: Secondary | ICD-10-CM

## 2018-08-12 DIAGNOSIS — Z01810 Encounter for preprocedural cardiovascular examination: Secondary | ICD-10-CM

## 2018-08-12 DIAGNOSIS — I878 Other specified disorders of veins: Secondary | ICD-10-CM

## 2018-08-12 DIAGNOSIS — I482 Chronic atrial fibrillation, unspecified: Secondary | ICD-10-CM

## 2018-08-12 DIAGNOSIS — I10 Essential (primary) hypertension: Secondary | ICD-10-CM

## 2018-08-12 DIAGNOSIS — E876 Hypokalemia: Secondary | ICD-10-CM

## 2018-08-12 NOTE — Telephone Encounter (Signed)
OPEN ERROR

## 2018-08-12 NOTE — Patient Instructions (Addendum)
Medication Instructions:  No changes  If you need a refill on your cardiac medications before your next appointment, please call your pharmacy.   Lab work: None     Testing/Procedures: None   Follow-Up: At [facility], you and your health needs are our priority.  As part of our continuing mission to provide you with exceptional heart care, we have created designated Provider Care Teams.  These Care Teams include your primary Cardiologist (physician) and Advanced Practice Providers (APPs -  Physician Assistants and Nurse Practitioners) who all work together to provide you with the care you need, when you need it. . You will need a follow up appointment in  6 months [place].  Please call our office 2 months in advance to schedule this appointment.  You may see Glenetta Hew, MD or one of the following Advanced Practice Providers on your designated Care Team:   . Rosaria Ferries, PA-C . Jory Sims, DNP, ANP  Any Other Special Instructions Will Be Listed Below (If Applicable).

## 2018-08-12 NOTE — Telephone Encounter (Signed)
CALLED PATIENT , UNABLE TO LEAVE A MESSAGE- PHONE HANGS UP. AVS SUMMARY Cameron BE MAILED TO PATIENT WITH INSTRUCTIONS

## 2018-08-12 NOTE — Telephone Encounter (Signed)
PATIENT RETURN CALL- INSTRUCTION GIVEN ,AND AVS SUMMARY WILL BE MAILED

## 2018-08-12 NOTE — Progress Notes (Signed)
Virtual Visit via Telephone Note   This visit type was conducted due to national recommendations for restrictions regarding the COVID-19 Pandemic (e.g. social distancing) in an effort to limit this patient's exposure and mitigate transmission in our community.  Due to her co-morbid illnesses, this patient is at least at moderate risk for complications without adequate follow up.  This format is felt to be most appropriate for this patient at this time.  The patient did not have access to video technology/had technical difficulties with video requiring transitioning to audio format only (telephone).  All issues noted in this document were discussed and addressed.  No physical exam could be performed with this format.  Please refer to the patient's chart for her  consent to telehealth for [facility].   Patient has given verbal permission to conduct this visit via virtual appointment and to bill insurance 08/12/2018 9:51 PM     Evaluation Performed:  Follow-up visit  Date:  08/12/2018   ID:  Donna [name], DOB Jul 16, 1950, MRN [id number]  Patient Location: Home Provider Location: Home  PCP:  Janine Limbo, PA-C  Cardiologist:  Glenetta Hew, MD  Electrophysiologist:  None   Chief Complaint:  4-5 month f/u - Afib, Edema  History of Present Illness:    Donna [name] is a 68 y.o. female with PMH notable for Afib, chronic venous stasis edema/lymphedema who presents via audio/video conferencing for a telehealth visit today. Donna [name] is now on her 53th Cardiologist --> has carried a Dx of "F[place]" despite an essentially normal Cardiac Evaluation - relatively normal Echo & Cath.  She has chronic LE Edema - mostly related to venous stasis combined with a component of OSA/OHS (no PND or orthopnea going along with edema.  She does had Chronic / Persistent Atrial Fibrillation (rate controlled & on warfarin She has been plaqued with Hypokalemia & Hypomagnesemia requiring significant  supplementation.  She is wheelchair-bound with bilateral knee arthritis, but has had her right knee TKA and is now pending left TKA. (Right knee TKA [facility]) December 2018) -- unfortunately, he L knee TKA has been delayed (most recently related to surreptitious finding of Afib RVR   Donna [name] was last seen in March 2020.  She was quite distraught over the fact that her surgery had been yet again delayed.  She was otherwise stable from a cardiac standpoint.  Plan was for surgery in April but that did not happen because of now concerns with new TIAs, diabetes control, obesity and dysphagia. --Her heart rate was relatively well controlled.  Was taking Zaroxolyn once a week and standing dose of furosemide.  Was not taking spironolactone routinely (we wanted her to start taking it more regularly to see if it would help as a diuretic and her hypokalemia.) --> We decided that her blood pressure still too low for Toprol.  I decided simply use amiodarone for rate control instead.  Interval History:  Has not had issues with her heart lately - rates are better controlled with Amiodarone.  Stopped Spironolactone - UTI Sx improved. Also given pyridium.   L knee still hurts - can't get around on it. Notes tremors.  Doing her exercises - has a brace for her L knee. --> Pre-Knee Sgx: needs to have Endoscopy (difficulty eating - food gets stuck) - per Anesthesiology; also needs Endocrinology for DM management (seeing in Aug), also going for nutrition counseling   Cardiovascular ROS: no chest pain or dyspnea on exertion positive for -  orthopnea and - chronic, with chronic edema negative for - irregular heartbeat, palpitations, paroxysmal nocturnal dyspnea, rapid heart rate, shortness of breath or syncope/near syncope; TIA/amaurosis fugax, bleeding issues to the extent that she is active   Still sleeps with bed ~45-70 deg; Swelling better  ON 4/24 - scraped leg getting out of car - blister/knot with swelling --  blister bursts when she went in for Endoscopy - delayed.  Also has knot on side of leg - warm/ red & painful   The patient does not have symptoms concerning for COVID-19 infection (fever, chills, cough, or new shortness of breath).  Has had persistent cough & did have Fever with UTI. The patient is practicing social distancing.  No issues with getting necessities.  ROS:  Please see the history of present illness.    Review of Systems  Genitourinary: Positive for dysuria (better with Pyridium & chronic Cipro - off of Bactrim). Negative for frequency and hematuria.  Psychiatric/Behavioral: Positive for memory loss.    Past Medical History:  Diagnosis Date   CHF (congestive heart failure) (HCC)    Chronic /permanent atrial fibrillation ([place]) 2005   On Warfarin (follwed @ [facility]); Rate controlled - On BB   Chronic diastolic heart failure, NYHA class 2 (HCC)    Updated by A. fib, hypertensive heart disease and obesity; EDP was only 7 by cardiac catheterization in September 2017   Diabetic lumbosacral plexopathy (HCC)    Fibromyalgia    Generalized anxiety disorder    Hypokalemia 09/03/2017   Incontinence    Major depressive disorder    OSA on CPAP    Restless leg syndrome    Syncope 09/03/2017   Type II diabetes mellitus [name])    Past Surgical History:  Procedure Laterality Date   CARDIAC CATHETERIZATION  11/09/2015   [facility]: Nonocclusive CAD. EF 55%. EDP 7 mmHg.   CHOLECYSTECTOMY OPEN     JOINT REPLACEMENT     KNEE ARTHROSCOPY Right    MASS EXCISION Left    sarcoma - shoulder   SHOULDER OPEN ROTATOR CUFF REPAIR Bilateral    TONSILLECTOMY     TOTAL HIP ARTHROPLASTY Right 2015   TOTAL HIP REVISION Right 2015   TOTAL KNEE ARTHROPLASTY Right    TRANSTHORACIC ECHOCARDIOGRAM  11/2013; 02/2016   a) [facility]: with Definity:  EF 60-65%. Mod LA dilation. Mild AoV sclerosis. Normal RHP. Indeterminate LV filling  pressures. b) [facility]: Normal cavity size. EF 55-60%.no RWMA, mild AoV sclerosis (R cusp), Mod MAC. Mild biAtrial dilation.   TRANSTHORACIC ECHOCARDIOGRAM  08/2017    Mild LVH with normal EF 55-60%.  Unable to fully assess wall motion, but appear to be normal.  Unable to determine diastolic function because of A. fib.  Mild to moderate MAC with mild mitral stenosis (mean gradient 9 mmHg).  Moderate LA dilation.  CVP estimated 3 mmHg.  No obvious evidence of pulmonary hypertension.   VAGINAL HYSTERECTOMY       Current Meds  Medication Sig   acetaminophen (TYLENOL) 500 MG tablet Take 500 mg by mouth every 6 (six) hours as needed for mild pain.    ALPRAZolam (XANAX) 0.25 MG tablet Take 1 tablet (0.25 mg total) by mouth 3 (three) times daily.   amiodarone (PACERONE) 200 MG tablet Take 1 tablet (200 mg total) by mouth daily.   busPIRone (BUSPAR) 10 MG tablet Take 10 mg by mouth 2 (two) times daily.    Cholecalciferol (VITAMIN D) 2000 units tablet Take  4,000 Units by mouth daily.    ciprofloxacin (CIPRO) 250 MG tablet    Dulaglutide (TRULICITY) 1.5 MH/6.8GS SOPN Inject 1.5 mg into the skin every Tuesday.   ezetimibe (ZETIA) 10 MG tablet Take 10 mg by mouth at bedtime.    ferrous sulfate 325 (65 FE) MG tablet Take 325 mg by mouth See admin instructions. Take one tablet (325 mg) by mouth on Sunday, Monday, Wednesday and Friday mornings.   furosemide (LASIX) 40 MG tablet Take 40 mg by mouth daily.   gabapentin (NEURONTIN) 600 MG tablet Take 600 mg by mouth 3 (three) times daily.   guaiFENesin (MUCINEX) 600 MG 12 hr tablet Take 600 mg by mouth 2 (two) times daily as needed for cough or to loosen phlegm.    insulin detemir (LEVEMIR) 100 UNIT/ML injection Inject 0.4 mLs (40 Units total) into the skin at bedtime.   insulin lispro (HUMALOG KWIKPEN) 100 UNIT/ML KiwkPen Inject 6-14 Units into the skin See admin instructions. Inject 6-14 units subcutaneously before each meal per sliding scale: CBG  201-250 6 units, 251-300 8 units, 301-350 10 units, 351-400 12 units, over 400 14 units and call MD   lidocaine (LIDODERM) 5 % Place 1 patch onto the skin daily as needed (right hip pain). Remove & Discard patch within 12 hours or as directed by MD    magnesium oxide (MAG-OX) 400 MG tablet Take 1 tablet (400 mg total) by mouth 3 (three) times daily. ALSO TAKE A EXTRA 400 MG TABLET ON THE DAYS YOU TAKE ZAROXOLYN TABLET   Melatonin 5 MG CAPS Take 5 mg by mouth at bedtime.   metolazone (ZAROXOLYN) 10 MG tablet Take 10 mg by mouth as needed.    miconazole (MICOTIN) 2 % powder Apply 1 application topically See admin instructions. Apply topically to abdominal folds twice daily   omeprazole (PRILOSEC) 20 MG capsule Take 20 mg by mouth daily.   ondansetron (ZOFRAN) 4 MG tablet Take 1 tablet (4 mg total) by mouth every 6 (six) hours as needed for nausea.   OxyCODONE HCl, Abuse Deter, (OXAYDO) 5 MG TABA Take 1 tablet by mouth 3 (three) times daily as needed (pain). (Patient taking differently: Take 1 tablet by mouth 3 (three) times daily as needed (moderate pain). )   potassium chloride SA (K-DUR,KLOR-CON) 20 MEQ tablet Take 5 tablets (100 mEq total) by mouth daily. TAKE AN EXTRA 20 MEQ ON THE DAYS YOU TAKE ZAROXLYN   pramipexole (MIRAPEX) 0.125 MG tablet Take 0.125 mg by mouth daily.   sertraline (ZOLOFT) 100 MG tablet Take 100 mg by mouth daily.   Sulfamethoxazole-Trimethoprim (BACTRIM PO) Take 80 mg by mouth daily.   tiZANidine (ZANAFLEX) 4 MG tablet Take 4 mg by mouth 3 (three) times daily as needed for muscle spasms.   Turmeric 500 MG CAPS Take 500 mg by mouth daily.    VENLAFAXINE HCL ER PO Take 100 mg by mouth daily with breakfast.    vitamin C (ASCORBIC ACID) 500 MG tablet Take 500 mg by mouth daily.   warfarin (COUMADIN) 7.5 MG tablet Take 7.5 mg by mouth daily. Takes 7.5 mg PO daily, but 10 mg on Tues   -- On Warfarin - checks INR @ home.   Lasix 40 mg daily -  Metolazone only on  Sundays.   Allergies:   Bee venom, Other, Alteplase, Cefepime, Fentanyl, and Metformin and related   Social History   Tobacco Use   Smoking status: Never Smoker   Smokeless tobacco: Never Used  Substance Use Topics   Alcohol use: No   Drug use: No     Family Hx: The patient's family history includes Heart disease in her mother; Hypertension in her mother; Leukemia in her son.   Prior CV studies:   The following studies were reviewed today:  none:  Labs/Other Tests and Data Reviewed:    EKG:  No ECG reviewed.  Recent Labs: 09/03/2017: B Natriuretic Peptide 137.8 09/27/2017: TSH 0.524 10/24/2017: Hemoglobin 9.6; Platelets 192 02/02/2018: ALT 16 02/22/2018: Magnesium 1.4 03/25/2018: BUN 22; Creatinine, Ser 1.03; Potassium 3.9; Sodium 144   Recent Lipid Panel Lab Results  Component Value Date/Time   CHOL 103 09/04/2017 02:36 AM   TRIG 73 09/04/2017 02:36 AM   HDL 34 (L) 09/04/2017 02:36 AM   CHOLHDL 3.0 09/04/2017 02:36 AM   LDLCALC 54 09/04/2017 02:36 AM    Wt Readings from Last 3 Encounters:  08/12/18 273 lb (123.8 kg)  02/02/18 263 lb 12.8 oz (119.7 kg)  10/23/17 285 lb (129.3 kg)     Objective:    Vital Signs:  BP (!) 121/51    Pulse 88    Ht _0  (1.778 m)    Wt 273 lb (123.8 kg)    BMI 39.17 kg/m   VITAL SIGNS:  reviewed GEN:  no acute distress RESPIRATORY:  non-labored NEURO:  A&O x 3 PSYCH:  normal affect; seems to be in good spirits  ASSESSMENT & PLAN:    Problem List Items Addressed This Visit    Anticoagulant long-term use   Chronic atrial fibrillation ([place]): CHA2DS2-VASc Score 5; on Warfarin - Primary (Chronic)   Relevant Medications   warfarin (COUMADIN) 7.5 MG tablet   Essential hypertension (Chronic)   Relevant Medications   warfarin (COUMADIN) 7.5 MG tablet   Hypokalemia (Chronic)   Preop cardiovascular exam   Venous stasis of both lower extremities (Chronic)   Relevant Medications   warfarin (COUMADIN) 7.5 MG tablet     Sherleen  is doing well.  Her A. fib rate seems to be well controlled.  Her venous statis edema is controlled with current dose of Lasix and metolazone.  She is feeling better off of the spironolactone with decreased dysuria -- increased K-dur for Hypokalemia. . She is tolerating amiodarone well.  We talked about the importance of having her annual eye eye exam.  I do not think they will use amiodarone long-term, however it does seem to be very effective with rate control.  At least until she has her surgery would continue amiodarone.  Her blood pressure looks great without any treatment.  Using the amiodarone for rate control has obviated the need for an antihypertensive rate control agent.  INR is been stable with warfarin. From a cardiac standpoint she is doing well and will be ready at any time for surgery, but apparently has multiple other procedures that need to be done as part of the evaluation.  No changes to be made.  We will follow-up with her in December to January timeframe to potentially reevaluate preoperatively.  COVID-19 Education: The signs and symptoms of COVID-19 were discussed with the patient and how to seek care for testing (follow up with PCP or arrange E-visit).   The importance of social distancing was discussed today.  Time:   Today, I have spent 22 minutes with the patient with telehealth technology discussing the above problems.     Medication Adjustments/Labs and Tests Ordered: Current medicines are reviewed at length with the patient today.  Concerns  regarding medicines are outlined above.  Medication Instructions: no changes  Tests Ordered: No orders of the defined types were placed in this encounter.   Medication Changes: No orders of the defined types were placed in this encounter.   Disposition:  Follow up in 6 month(s)    Signed, Glenetta Hew, MD  08/12/2018 9:51 PM    [facility]

## 2018-08-25 DIAGNOSIS — M1711 Unilateral primary osteoarthritis, right knee: Secondary | ICD-10-CM | POA: Diagnosis not present

## 2018-08-26 DIAGNOSIS — E119 Type 2 diabetes mellitus without complications: Secondary | ICD-10-CM | POA: Diagnosis not present

## 2018-08-26 DIAGNOSIS — Z96641 Presence of right artificial hip joint: Secondary | ICD-10-CM | POA: Diagnosis not present

## 2018-08-26 DIAGNOSIS — M1711 Unilateral primary osteoarthritis, right knee: Secondary | ICD-10-CM | POA: Diagnosis not present

## 2018-08-26 DIAGNOSIS — M8588 Other specified disorders of bone density and structure, other site: Secondary | ICD-10-CM | POA: Diagnosis not present

## 2018-08-26 DIAGNOSIS — M48062 Spinal stenosis, lumbar region with neurogenic claudication: Secondary | ICD-10-CM | POA: Diagnosis not present

## 2018-08-26 DIAGNOSIS — Z794 Long term (current) use of insulin: Secondary | ICD-10-CM | POA: Diagnosis not present

## 2018-08-26 DIAGNOSIS — M160 Bilateral primary osteoarthritis of hip: Secondary | ICD-10-CM | POA: Diagnosis not present

## 2018-08-26 DIAGNOSIS — Z6841 Body Mass Index (BMI) 40.0 and over, adult: Secondary | ICD-10-CM | POA: Diagnosis not present

## 2018-08-26 DIAGNOSIS — M47816 Spondylosis without myelopathy or radiculopathy, lumbar region: Secondary | ICD-10-CM | POA: Diagnosis not present

## 2018-08-26 DIAGNOSIS — M5440 Lumbago with sciatica, unspecified side: Secondary | ICD-10-CM | POA: Diagnosis not present

## 2018-08-26 DIAGNOSIS — M1712 Unilateral primary osteoarthritis, left knee: Secondary | ICD-10-CM | POA: Diagnosis not present

## 2018-08-31 DIAGNOSIS — Z7901 Long term (current) use of anticoagulants: Secondary | ICD-10-CM | POA: Diagnosis not present

## 2018-09-08 ENCOUNTER — Telehealth: Payer: Self-pay | Admitting: Cardiology

## 2018-09-08 NOTE — Telephone Encounter (Signed)
Pt c/o medication issue:  1. Name of Medication: amiodarone (PACERONE) 200 MG tablet  2. How are you currently taking this medication (dosage and times per day)? 200 mg 1x daily  3. Are you having a reaction (difficulty breathing--STAT)? no  4. What is your medication issue? Patient states she is having shakes and tremors.   She wants to know if it is safe for her to just stop taking all together, or if she should just slowly reduce the dosage of her medication.  She states she started taking this medication in preparation for a surgery, but the surgery has been postponed.  Please advise

## 2018-09-08 NOTE — Telephone Encounter (Signed)
Patient would like to come off of her amiodarone since she is not going to have her knee surgery now. Patient states she is having nausea and having shakes from this medication. She would like to know how to stop the medication, and if so can she increase the Furosemide so she doesn't have issues.  Advised I would route message to MD to advise.  Thank you!

## 2018-09-08 NOTE — Telephone Encounter (Signed)
OK --  That is fine - lets see how her HR does off of it.  Glenetta Hew, MD

## 2018-09-08 NOTE — Telephone Encounter (Signed)
Should patient increase furosemide?  Just wanted to clarify. Thank you!

## 2018-09-08 NOTE — Telephone Encounter (Signed)
Yes, okay to increase  As needed as far as the furosemide goes.

## 2018-09-09 NOTE — Telephone Encounter (Signed)
Called and notified patient. Patient verbalized understanding,

## 2018-09-10 DIAGNOSIS — G8929 Other chronic pain: Secondary | ICD-10-CM | POA: Diagnosis not present

## 2018-09-10 DIAGNOSIS — M545 Low back pain: Secondary | ICD-10-CM | POA: Diagnosis not present

## 2018-09-10 DIAGNOSIS — E1140 Type 2 diabetes mellitus with diabetic neuropathy, unspecified: Secondary | ICD-10-CM | POA: Diagnosis not present

## 2018-09-10 DIAGNOSIS — E1165 Type 2 diabetes mellitus with hyperglycemia: Secondary | ICD-10-CM | POA: Diagnosis not present

## 2018-09-22 DIAGNOSIS — M5440 Lumbago with sciatica, unspecified side: Secondary | ICD-10-CM | POA: Diagnosis not present

## 2018-09-22 DIAGNOSIS — M160 Bilateral primary osteoarthritis of hip: Secondary | ICD-10-CM | POA: Diagnosis not present

## 2018-09-22 DIAGNOSIS — M2578 Osteophyte, vertebrae: Secondary | ICD-10-CM | POA: Diagnosis not present

## 2018-09-22 DIAGNOSIS — M48062 Spinal stenosis, lumbar region with neurogenic claudication: Secondary | ICD-10-CM | POA: Diagnosis not present

## 2018-09-22 DIAGNOSIS — M4316 Spondylolisthesis, lumbar region: Secondary | ICD-10-CM | POA: Diagnosis not present

## 2018-09-22 DIAGNOSIS — M4186 Other forms of scoliosis, lumbar region: Secondary | ICD-10-CM | POA: Diagnosis not present

## 2018-09-22 DIAGNOSIS — M47816 Spondylosis without myelopathy or radiculopathy, lumbar region: Secondary | ICD-10-CM | POA: Diagnosis not present

## 2018-09-22 DIAGNOSIS — M1712 Unilateral primary osteoarthritis, left knee: Secondary | ICD-10-CM | POA: Diagnosis not present

## 2018-09-22 DIAGNOSIS — M5126 Other intervertebral disc displacement, lumbar region: Secondary | ICD-10-CM | POA: Diagnosis not present

## 2018-09-27 DIAGNOSIS — Z7901 Long term (current) use of anticoagulants: Secondary | ICD-10-CM | POA: Diagnosis not present

## 2018-10-05 DIAGNOSIS — Z794 Long term (current) use of insulin: Secondary | ICD-10-CM | POA: Diagnosis not present

## 2018-10-05 DIAGNOSIS — E1165 Type 2 diabetes mellitus with hyperglycemia: Secondary | ICD-10-CM | POA: Diagnosis not present

## 2018-10-14 DIAGNOSIS — I482 Chronic atrial fibrillation, unspecified: Secondary | ICD-10-CM | POA: Diagnosis not present

## 2018-10-14 DIAGNOSIS — M545 Low back pain: Secondary | ICD-10-CM | POA: Diagnosis not present

## 2018-10-14 DIAGNOSIS — E1140 Type 2 diabetes mellitus with diabetic neuropathy, unspecified: Secondary | ICD-10-CM | POA: Diagnosis not present

## 2018-10-14 DIAGNOSIS — Z7901 Long term (current) use of anticoagulants: Secondary | ICD-10-CM | POA: Diagnosis not present

## 2018-10-25 DIAGNOSIS — Z7901 Long term (current) use of anticoagulants: Secondary | ICD-10-CM | POA: Diagnosis not present

## 2018-10-27 DIAGNOSIS — R29898 Other symptoms and signs involving the musculoskeletal system: Secondary | ICD-10-CM | POA: Diagnosis not present

## 2018-11-12 ENCOUNTER — Inpatient Hospital Stay (HOSPITAL_COMMUNITY)
Admission: EM | Admit: 2018-11-12 | Discharge: 2018-11-19 | DRG: 641 | Disposition: A | Discharge status: Skilled Nursing Facility | Payer: Medicare Other | Attending: Internal Medicine | Admitting: Internal Medicine

## 2018-11-12 ENCOUNTER — Encounter (HOSPITAL_COMMUNITY): Payer: Self-pay | Admitting: *Deleted

## 2018-11-12 ENCOUNTER — Emergency Department (HOSPITAL_COMMUNITY): Payer: Medicare Other

## 2018-11-12 ENCOUNTER — Other Ambulatory Visit: Payer: Self-pay

## 2018-11-12 DIAGNOSIS — Z79891 Long term (current) use of opiate analgesic: Secondary | ICD-10-CM

## 2018-11-12 DIAGNOSIS — I951 Orthostatic hypotension: Secondary | ICD-10-CM

## 2018-11-12 DIAGNOSIS — I4821 Permanent atrial fibrillation: Secondary | ICD-10-CM | POA: Diagnosis not present

## 2018-11-12 DIAGNOSIS — I110 Hypertensive heart disease with heart failure: Secondary | ICD-10-CM | POA: Diagnosis present

## 2018-11-12 DIAGNOSIS — Z96651 Presence of right artificial knee joint: Secondary | ICD-10-CM | POA: Diagnosis present

## 2018-11-12 DIAGNOSIS — E1142 Type 2 diabetes mellitus with diabetic polyneuropathy: Secondary | ICD-10-CM | POA: Diagnosis present

## 2018-11-12 DIAGNOSIS — E875 Hyperkalemia: Secondary | ICD-10-CM | POA: Diagnosis present

## 2018-11-12 DIAGNOSIS — R4781 Slurred speech: Secondary | ICD-10-CM | POA: Diagnosis not present

## 2018-11-12 DIAGNOSIS — E876 Hypokalemia: Secondary | ICD-10-CM | POA: Diagnosis not present

## 2018-11-12 DIAGNOSIS — Z23 Encounter for immunization: Secondary | ICD-10-CM

## 2018-11-12 DIAGNOSIS — Z20828 Contact with and (suspected) exposure to other viral communicable diseases: Secondary | ICD-10-CM | POA: Diagnosis not present

## 2018-11-12 DIAGNOSIS — G8929 Other chronic pain: Secondary | ICD-10-CM | POA: Diagnosis present

## 2018-11-12 DIAGNOSIS — D72829 Elevated white blood cell count, unspecified: Secondary | ICD-10-CM

## 2018-11-12 DIAGNOSIS — Z8249 Family history of ischemic heart disease and other diseases of the circulatory system: Secondary | ICD-10-CM

## 2018-11-12 DIAGNOSIS — Z794 Long term (current) use of insulin: Secondary | ICD-10-CM

## 2018-11-12 DIAGNOSIS — M797 Fibromyalgia: Secondary | ICD-10-CM | POA: Diagnosis present

## 2018-11-12 DIAGNOSIS — G2581 Restless legs syndrome: Secondary | ICD-10-CM | POA: Diagnosis present

## 2018-11-12 DIAGNOSIS — Z792 Long term (current) use of antibiotics: Secondary | ICD-10-CM

## 2018-11-12 DIAGNOSIS — Z7901 Long term (current) use of anticoagulants: Secondary | ICD-10-CM

## 2018-11-12 DIAGNOSIS — F411 Generalized anxiety disorder: Secondary | ICD-10-CM | POA: Diagnosis present

## 2018-11-12 DIAGNOSIS — I4891 Unspecified atrial fibrillation: Secondary | ICD-10-CM | POA: Diagnosis not present

## 2018-11-12 DIAGNOSIS — Z90710 Acquired absence of both cervix and uterus: Secondary | ICD-10-CM

## 2018-11-12 DIAGNOSIS — R0902 Hypoxemia: Secondary | ICD-10-CM | POA: Diagnosis not present

## 2018-11-12 DIAGNOSIS — I482 Chronic atrial fibrillation, unspecified: Secondary | ICD-10-CM | POA: Diagnosis present

## 2018-11-12 DIAGNOSIS — E1165 Type 2 diabetes mellitus with hyperglycemia: Secondary | ICD-10-CM | POA: Diagnosis not present

## 2018-11-12 DIAGNOSIS — F329 Major depressive disorder, single episode, unspecified: Secondary | ICD-10-CM | POA: Diagnosis present

## 2018-11-12 DIAGNOSIS — E119 Type 2 diabetes mellitus without complications: Secondary | ICD-10-CM

## 2018-11-12 DIAGNOSIS — G4733 Obstructive sleep apnea (adult) (pediatric): Secondary | ICD-10-CM | POA: Diagnosis present

## 2018-11-12 DIAGNOSIS — Z6836 Body mass index (BMI) 36.0-36.9, adult: Secondary | ICD-10-CM

## 2018-11-12 DIAGNOSIS — Z79899 Other long term (current) drug therapy: Secondary | ICD-10-CM

## 2018-11-12 DIAGNOSIS — M25559 Pain in unspecified hip: Secondary | ICD-10-CM

## 2018-11-12 DIAGNOSIS — T502X5A Adverse effect of carbonic-anhydrase inhibitors, benzothiadiazides and other diuretics, initial encounter: Secondary | ICD-10-CM | POA: Diagnosis present

## 2018-11-12 DIAGNOSIS — I5032 Chronic diastolic (congestive) heart failure: Secondary | ICD-10-CM | POA: Diagnosis not present

## 2018-11-12 DIAGNOSIS — R1314 Dysphagia, pharyngoesophageal phase: Secondary | ICD-10-CM | POA: Diagnosis present

## 2018-11-12 DIAGNOSIS — F418 Other specified anxiety disorders: Secondary | ICD-10-CM | POA: Diagnosis present

## 2018-11-12 DIAGNOSIS — I447 Left bundle-branch block, unspecified: Secondary | ICD-10-CM | POA: Diagnosis not present

## 2018-11-12 DIAGNOSIS — M1990 Unspecified osteoarthritis, unspecified site: Secondary | ICD-10-CM | POA: Diagnosis present

## 2018-11-12 DIAGNOSIS — R296 Repeated falls: Secondary | ICD-10-CM | POA: Diagnosis present

## 2018-11-12 DIAGNOSIS — I872 Venous insufficiency (chronic) (peripheral): Secondary | ICD-10-CM | POA: Diagnosis present

## 2018-11-12 DIAGNOSIS — L89150 Pressure ulcer of sacral region, unstageable: Secondary | ICD-10-CM | POA: Diagnosis present

## 2018-11-12 DIAGNOSIS — Z993 Dependence on wheelchair: Secondary | ICD-10-CM

## 2018-11-12 DIAGNOSIS — E785 Hyperlipidemia, unspecified: Secondary | ICD-10-CM | POA: Diagnosis present

## 2018-11-12 DIAGNOSIS — R531 Weakness: Secondary | ICD-10-CM | POA: Diagnosis not present

## 2018-11-12 DIAGNOSIS — Z96641 Presence of right artificial hip joint: Secondary | ICD-10-CM | POA: Diagnosis present

## 2018-11-12 DIAGNOSIS — D649 Anemia, unspecified: Secondary | ICD-10-CM | POA: Diagnosis present

## 2018-11-12 DIAGNOSIS — M545 Low back pain: Secondary | ICD-10-CM | POA: Diagnosis present

## 2018-11-12 LAB — PROTIME-INR
INR: 2.4 — ABNORMAL HIGH (ref 0.8–1.2)
Prothrombin Time: 25.8 seconds — ABNORMAL HIGH (ref 11.4–15.2)

## 2018-11-12 LAB — BASIC METABOLIC PANEL
Anion gap: 19 — ABNORMAL HIGH (ref 5–15)
BUN: 17 mg/dL (ref 8–23)
CO2: 30 mmol/L (ref 22–32)
Calcium: 9.7 mg/dL (ref 8.9–10.3)
Chloride: 87 mmol/L — ABNORMAL LOW (ref 98–111)
Creatinine, Ser: 1.14 mg/dL — ABNORMAL HIGH (ref 0.44–1.00)
GFR calc Af Amer: 58 mL/min — ABNORMAL LOW (ref >60)
GFR calc non Af Amer: 50 mL/min — ABNORMAL LOW (ref >60)
Glucose, Bld: 345 mg/dL — ABNORMAL HIGH (ref 70–99)
Potassium: <2 mmol/L — CL (ref 3.5–5.1)
Sodium: 136 mmol/L (ref 135–145)

## 2018-11-12 LAB — CBC
HCT: 47.6 % — ABNORMAL HIGH (ref 36.0–46.0)
Hemoglobin: 14.7 g/dL (ref 12.0–15.0)
MCH: 25.1 pg — ABNORMAL LOW (ref 26.0–34.0)
MCHC: 30.9 g/dL (ref 30.0–36.0)
MCV: 81.2 fL (ref 80.0–100.0)
Platelets: 259 10*3/uL (ref 150–400)
RBC: 5.86 MIL/uL — ABNORMAL HIGH (ref 3.87–5.11)
RDW: 17.7 % — ABNORMAL HIGH (ref 11.5–15.5)
WBC: 13.4 10*3/uL — ABNORMAL HIGH (ref 4.0–10.5)
nRBC: 0 % (ref 0.0–0.2)

## 2018-11-12 LAB — TROPONIN I (HIGH SENSITIVITY)
Troponin I (High Sensitivity): 19 ng/L — ABNORMAL HIGH (ref <18)
Troponin I (High Sensitivity): 19 ng/L — ABNORMAL HIGH (ref <18)

## 2018-11-12 MED ORDER — DILTIAZEM HCL-DEXTROSE 100-5 MG/100ML-% IV SOLN (PREMIX)
5.0000 mg/h | INTRAVENOUS | Status: DC
  Administered 2018-11-13: 5 mg/h via INTRAVENOUS
  Filled 2018-11-12: qty 100

## 2018-11-12 MED ORDER — POTASSIUM CHLORIDE CRYS ER 20 MEQ PO TBCR
60.0000 meq | EXTENDED_RELEASE_TABLET | Freq: Once | ORAL | Status: AC
  Administered 2018-11-12: 60 meq via ORAL
  Filled 2018-11-12: qty 3

## 2018-11-12 MED ORDER — POTASSIUM CHLORIDE 10 MEQ/100ML IV SOLN
10.0000 meq | INTRAVENOUS | Status: DC
  Administered 2018-11-12 – 2018-11-13 (×3): 10 meq via INTRAVENOUS
  Filled 2018-11-12 (×3): qty 100

## 2018-11-12 MED ORDER — DILTIAZEM LOAD VIA INFUSION
10.0000 mg | Freq: Once | INTRAVENOUS | Status: AC
  Administered 2018-11-13: 10 mg via INTRAVENOUS
  Filled 2018-11-12: qty 10

## 2018-11-12 MED ORDER — SODIUM CHLORIDE 0.9% FLUSH: 3.0000 mL | Freq: Once | INTRAVENOUS | Status: DC

## 2018-11-12 MED ORDER — MAGNESIUM SULFATE 2 GM/50ML IV SOLN
2.0000 g | Freq: Once | INTRAVENOUS | Status: AC
  Administered 2018-11-12: 2 g via INTRAVENOUS
  Filled 2018-11-12: qty 50

## 2018-11-12 NOTE — ED Provider Notes (Signed)
[place] EMERGENCY DEPARTMENT Provider Note   CSN: [id number] Arrival date & time: 11/12/18  1718     History   Chief Complaint Chief Complaint  Patient presents with  . Weakness    HPI Donna [name] is a 68 y.o. female with a hx of chronic atrial fibrillation on warfarin & amiodarone, anemia, fibromyalgia, anxiety, OSA on CPAP, T2DM, hypokalemia, & HTN who presents to the ED w/ multiple complaints primary being generalized weakness for the past several weeks, acutely worsened over the past few days. Patient states she feels extremely fatigued & generally weak, sxs are constant, much worse with any type of movement, especially when she transfer from her wheelchair- she reports this has gotten to the point she feels she cannot move on her own. She also mentions that she has had intermittent dyspnea w/ exertion and intermittent chest pain with movement & palpation. No other alleviating/aggravating factors. DOE & intermittent chest pain have been occurring for weeks. She has chronic hypokalemia- prescribed 100 mEq daily potassium extra 20 mEQ when she takes her zaroxlyn- she has not had her prescribed potassium today but has otherwise been taking this as prescribed. Denies fever, chills, nausea, vomiting, diarrhea, melena, or syncope.      HPI  Past Medical History:  Diagnosis Date  . CHF (congestive heart failure) ([place])   . Chronic /permanent atrial fibrillation ([place]) 2005   On Warfarin (follwed @ [facility]); Rate controlled - On BB  . Chronic diastolic heart failure, NYHA class 2 ([place])    Updated by A. fib, hypertensive heart disease and obesity; EDP was only 7 by cardiac catheterization in September 2017  . Diabetic lumbosacral plexopathy ([place])   . Fibromyalgia   . Generalized anxiety disorder   . Hypokalemia 09/03/2017  . Incontinence   . Major depressive disorder   . OSA on CPAP   . Restless leg syndrome   . Syncope 09/03/2017  . Type  II diabetes mellitus [facility])     Patient Active Problem List   Diagnosis Date Noted  . Hypomagnesemia 02/02/2018  . Preop cardiovascular exam 02/02/2018  . Decubitus ulcer of ischium, left, stage IV ([place]) 09/28/2017  . Cellulitis of sacral region 09/25/2017  . Normocytic anemia 09/25/2017  . Insulin-requiring or dependent type II diabetes mellitus ([place]) 09/25/2017  . Chronic pain 09/25/2017  . Chronic atrial fibrillation with RVR   . History of seizures 09/03/2017  . Hypokalemia 09/03/2017  . Depression 02/09/2017  . Anxiety 02/09/2017  . Venous stasis of both lower extremities 07/25/2016  . Essential hypertension 07/25/2016  . Anticoagulant long-term use 07/15/2016  . Anatomical narrow angle borderline glaucoma of both eyes 04/02/2016  . Chronic diastolic heart failure ([place]) 02/08/2016  . Chronic atrial fibrillation ([place]): CHA2DS2-VASc Score 5; on Warfarin 02/08/2016  . Morbid obesity ([place]) 02/08/2016  . OSA (obstructive sleep apnea) 02/08/2016  . Pre-operative cardiovascular examination 02/08/2016  . Medication management 02/08/2016  . Fibromyalgia 08/07/2012  . Restless legs syndrome 08/07/2012    Past Surgical History:  Procedure Laterality Date  . CARDIAC CATHETERIZATION  11/09/2015   [facility]: Nonocclusive CAD. EF 55%. EDP 7 mmHg.  Marland Kitchen CHOLECYSTECTOMY OPEN    . JOINT REPLACEMENT    . KNEE ARTHROSCOPY Right   . MASS EXCISION Left    sarcoma - shoulder  . SHOULDER OPEN ROTATOR CUFF REPAIR Bilateral   . TONSILLECTOMY    . TOTAL HIP ARTHROPLASTY Right 2015  . TOTAL HIP REVISION Right 2015  .  TOTAL KNEE ARTHROPLASTY Right   . TRANSTHORACIC ECHOCARDIOGRAM  11/2013; 02/2016   a) [facility]: with Definity:  EF 60-65%. Mod LA dilation. Mild AoV sclerosis. Normal RHP. Indeterminate LV filling pressures. b) [facility]: Normal cavity size. EF 55-60%.no RWMA, mild AoV sclerosis (R cusp), Mod MAC. Mild biAtrial dilation.  . TRANSTHORACIC ECHOCARDIOGRAM  08/2017    Mild  LVH with normal EF 55-60%.  Unable to fully assess wall motion, but appear to be normal.  Unable to determine diastolic function because of A. fib.  Mild to moderate MAC with mild mitral stenosis (mean gradient 9 mmHg).  Moderate LA dilation.  CVP estimated 3 mmHg.  No obvious evidence of pulmonary hypertension.  Marland Kitchen VAGINAL HYSTERECTOMY       OB History   No obstetric history on file.      Home Medications    Prior to Admission medications   Medication Sig Start Date End Date Taking? Authorizing Provider  acetaminophen (TYLENOL) 500 MG tablet Take 500 mg by mouth every 6 (six) hours as needed for mild pain.     [provider]  ALPRAZolam Duanne Moron) 0.25 MG tablet Take 1 tablet (0.25 mg total) by mouth 3 (three) times daily. 09/07/17   Raiford Noble Latif, DO  amiodarone (PACERONE) 200 MG tablet Take 1 tablet (200 mg total) by mouth daily. 07/03/18   Leonie Man, MD  busPIRone (BUSPAR) 10 MG tablet Take 10 mg by mouth 2 (two) times daily.     [provider]  Cholecalciferol (VITAMIN D) 2000 units tablet Take 4,000 Units by mouth daily.     [provider]  ciprofloxacin (CIPRO) 250 MG tablet  04/07/18   [provider]  Dulaglutide (TRULICITY) 1.5 HU/3.1SH SOPN Inject 1.5 mg into the skin every Tuesday.    [provider]  ezetimibe (ZETIA) 10 MG tablet Take 10 mg by mouth at bedtime.     [provider]  ferrous sulfate 325 (65 FE) MG tablet Take 325 mg by mouth See admin instructions. Take one tablet (325 mg) by mouth on Sunday, Monday, Wednesday and Friday mornings.    [provider]  furosemide (LASIX) 40 MG tablet Take 40 mg by mouth daily.    [provider]  gabapentin (NEURONTIN) 600 MG tablet Take 600 mg by mouth 3 (three) times daily.    [provider]  guaiFENesin (MUCINEX) 600 MG 12 hr tablet Take 600 mg by mouth 2 (two) times daily as needed for cough or to loosen phlegm.     [provider]   insulin detemir (LEVEMIR) 100 UNIT/ML injection Inject 0.4 mLs (40 Units total) into the skin at bedtime. 09/30/17   Georgette Shell, MD  insulin lispro (HUMALOG KWIKPEN) 100 UNIT/ML KiwkPen Inject 6-14 Units into the skin See admin instructions. Inject 6-14 units subcutaneously before each meal per sliding scale: CBG 201-250 6 units, 251-300 8 units, 301-350 10 units, 351-400 12 units, over 400 14 units and call MD    [provider]  lidocaine (LIDODERM) 5 % Place 1 patch onto the skin daily as needed (right hip pain). Remove & Discard patch within 12 hours or as directed by MD     [provider]  magnesium oxide (MAG-OX) 400 MG tablet Take 1 tablet (400 mg total) by mouth 3 (three) times daily. ALSO TAKE A EXTRA 400 MG TABLET ON THE DAYS YOU TAKE ZAROXOLYN TABLET 02/02/18   Leonie Man, MD  Melatonin 5 MG CAPS  Take 5 mg by mouth at bedtime.    [provider]  metolazone (ZAROXOLYN) 10 MG tablet Take 10 mg by mouth as needed.     [provider]  miconazole (MICOTIN) 2 % powder Apply 1 application topically See admin instructions. Apply topically to abdominal folds twice daily    [provider]  omeprazole (PRILOSEC) 20 MG capsule Take 20 mg by mouth daily.    [provider]  ondansetron (ZOFRAN) 4 MG tablet Take 1 tablet (4 mg total) by mouth every 6 (six) hours as needed for nausea. 09/30/17   Georgette Shell, MD  OxyCODONE HCl, Abuse Deter, (OXAYDO) 5 MG TABA Take 1 tablet by mouth 3 (three) times daily as needed (pain). Patient taking differently: Take 1 tablet by mouth 3 (three) times daily as needed (moderate pain).  09/07/17   Raiford Noble Latif, DO  potassium chloride SA (K-DUR,KLOR-CON) 20 MEQ tablet Take 5 tablets (100 mEq total) by mouth daily. TAKE AN EXTRA 20 MEQ ON THE DAYS YOU TAKE ZAROXLYN 02/02/18   Leonie Man, MD  pramipexole (MIRAPEX) 0.125 MG tablet Take 0.125 mg by mouth daily.    [provider]   sertraline (ZOLOFT) 100 MG tablet Take 100 mg by mouth daily.    [provider]  spironolactone (ALDACTONE) 25 MG tablet Take 1 tablet (25 mg total) by mouth daily. 05/12/18 08/10/18  Leonie Man, MD  Sulfamethoxazole-Trimethoprim (BACTRIM PO) Take 80 mg by mouth daily.    [provider]  tiZANidine (ZANAFLEX) 4 MG tablet Take 4 mg by mouth 3 (three) times daily as needed for muscle spasms.    [provider]  Turmeric 500 MG CAPS Take 500 mg by mouth daily.     [provider]  VENLAFAXINE HCL ER PO Take 100 mg by mouth daily with breakfast.     [provider]  vitamin C (ASCORBIC ACID) 500 MG tablet Take 500 mg by mouth daily.    [provider]  warfarin (COUMADIN) 7.5 MG tablet Take 7.5 mg by mouth daily. Takes 7.5 mg PO daily, but 10 mg on Tues    [provider]    Family History Family History  Problem Relation Age of Onset  . Hypertension Mother   . Heart disease Mother   . Leukemia Son     Social History Social History   Tobacco Use  . Smoking status: Never Smoker  . Smokeless tobacco: Never Used  Substance Use Topics  . Alcohol use: No  . Drug use: No     Allergies   Bee venom, Other, Alteplase, Cefepime, Fentanyl, and Metformin and related   Review of Systems Review of Systems  Constitutional: Positive for fatigue. Negative for chills and fever.  Respiratory: Positive for shortness of breath.   Cardiovascular: Positive for chest pain.  Gastrointestinal: Negative for abdominal pain, blood in stool, diarrhea, nausea and vomiting.  Neurological: Positive for weakness (generalized). Negative for dizziness and syncope.  All other systems reviewed and are negative.    Physical Exam Updated Vital Signs BP (!) 145/80 (BP Location: Right Arm)   Pulse (!) 121   Temp 98.3 F (36.8 C) (Oral)   Resp 20   Ht _0  (1.803 m)   Wt 117.9 kg   SpO2 96%   BMI 36.26 kg/m   Physical Exam Vitals  signs and nursing note reviewed.  Constitutional:      General: She is not in acute distress.  Appearance: She is well-developed. She is obese. She is not toxic-appearing.  HENT:     Head: Normocephalic and atraumatic.  Eyes:     General:        Right eye: No discharge.        Left eye: No discharge.     Conjunctiva/sclera: Conjunctivae normal.  Neck:     Musculoskeletal: Neck supple.  Cardiovascular:     Rate and Rhythm: Tachycardia present. Rhythm irregular.     Comments: 2+ radial & PT pulses bilaterally.  Pulmonary:     Effort: Pulmonary effort is normal. No respiratory distress.     Breath sounds: Normal breath sounds. No wheezing, rhonchi or rales.  Chest:     Chest wall: Tenderness (anterior chest wall) present.  Abdominal:     General: There is no distension.     Palpations: Abdomen is soft.     Tenderness: There is no abdominal tenderness. There is no guarding or rebound.  Skin:    General: Skin is warm and dry.     Findings: No rash.     Comments: Chronic appearing skin changes to bilateral lower legs.   Neurological:     Mental Status: She is alert.     Comments: Clear speech. 5/5 symmetric grip strength. 5/5 strength with ankle plantar/dorsiflexion bilaterally.   Psychiatric:        Behavior: Behavior normal.    ED Treatments / Results  Labs (all labs ordered are listed, but only abnormal results are displayed) Labs Reviewed  BASIC METABOLIC PANEL - Abnormal; Notable for the following components:      Result Value   Potassium <2.0 (*)    Chloride 87 (*)    Glucose, Bld 345 (*)    Creatinine, Ser 1.14 (*)    GFR calc non Af Amer 50 (*)    GFR calc Af Amer 58 (*)    Anion gap 19 (*)    All other components within normal limits  CBC - Abnormal; Notable for the following components:   WBC 13.4 (*)    RBC 5.86 (*)    HCT 47.6 (*)    MCH 25.1 (*)    RDW 17.7 (*)    All other components within normal limits  PROTIME-INR - Abnormal; Notable for the  following components:   Prothrombin Time 25.8 (*)    INR 2.4 (*)    All other components within normal limits  TROPONIN I (HIGH SENSITIVITY) - Abnormal; Notable for the following components:   Troponin I (High Sensitivity) 19 (*)    All other components within normal limits  TROPONIN I (HIGH SENSITIVITY) - Abnormal; Notable for the following components:   Troponin I (High Sensitivity) 19 (*)    All other components within normal limits  SARS CORONAVIRUS 2 (HOSPITAL ORDER, [place] [facility])  URINALYSIS, ROUTINE W REFLEX MICROSCOPIC  MAGNESIUM    EKG EKG Interpretation  Date/Time:  Friday November 12 2018 17:39:31 EDT Ventricular Rate:  126 PR Interval:    QRS Duration: 96 QT Interval:  392 QTC Calculation: 567 R Axis:   -12 Text Interpretation:  Critical Test Result: Long QTc Atrial fibrillation with rapid ventricular response Inferior infarct , age undetermined Anteroseptal infarct , age undetermined Marked ST abnormality, possible lateral subendocardial injury Abnormal ECG long QT new Confirmed by Merrily Pew [phone]) on 11/12/2018 11:29:55 PM   Radiology Dg Chest 2 View  Result Date: 11/12/2018 CLINICAL DATA:  Weakness for 6 weeks. EXAM: CHEST -  2 VIEW COMPARISON:  09/27/2017 and prior radiographs FINDINGS: UPPER limits normal heart size and mild hilar prominence unchanged from remote studies. There is no evidence of focal airspace disease, pulmonary edema, suspicious pulmonary nodule/mass, pleural effusion, or pneumothorax. No acute bony abnormalities are identified. IMPRESSION: No active cardiopulmonary disease. Electronically Signed   By: Margarette Canada M.D.   On: 11/12/2018 18:49    Procedures .Critical Care Performed by: Amaryllis Dyke, PA-C Authorized by: Amaryllis Dyke, PA-C    CRITICAL CARE Performed by: Kennith Maes   Total critical care time: 45 minutes  Critical care time was exclusive of separately billable  procedures and treating other patients.  Critical care was necessary to treat or prevent imminent or life-threatening deterioration.  Critical care was time spent personally by me on the following activities: development of treatment plan with patient and/or surrogate as well as nursing, discussions with consultants, evaluation of patient's response to treatment, examination of patient, obtaining history from patient or surrogate, ordering and performing treatments and interventions, ordering and review of laboratory studies, ordering and review of radiographic studies, pulse oximetry and re-evaluation of patient's condition.  (including critical care time)   Medications Ordered in ED Medications  sodium chloride flush (NS) 0.9 % injection 3 mL (has no administration in time range)     Initial Impression / Assessment and Plan / ED Course  I have reviewed the triage vital signs and the nursing notes.  Pertinent labs & imaging results that were available during my care of the patient were reviewed by me and considered in my medical decision making (see chart for details).    Patient presents to the ED for generalized weakness & intermittent DOE & chest pain. Patient nontoxic appearing, appears to be in afib w/ RVR- hx of permanent afib on warfarin. Not hypotensive or febrile. Anterior chest wall tenderness reproduces her chest pain. Lungs clear, no signs of respiratory distress.   Work-up per triage:  CBC: Leukocytosis @ 12.4. No significant anemia. Platelets WNL.  BMP: Critically hypokalemic @ < 2.0, hyperglycemia w/ elevated anion gap, bicarb WNL. Creatinine minimally increased from prior BUN WNL.  Trop: Mild elevation @ 19, repeat without increase, 19 as well EKG: Afib w/ RVR w/ critically QTc prolongation. - long QT is new CXR:  No active cardiopulmonary disease.  Leukocytosis: unclear source, negative CXR, add on UA.  Critical hypokalemia: 60 mEq oral, start IV infusion. 2g of  magnesium ordered as well.  Afib w/ RVR: Anticoagulated on warfarin- PT/INR therapeutic, initial plan was to start cardizem bolus & infusion, however improved to 105-110 without intervention therefore will hold off.  Chest pain/DOE: EKG w/o STEMI, trop without change or significant elevation- doubt ACS. Therapeutic on warfarin- doubt PE. CXR w/o infiltrate/effusion/edema. No widened mediastinum or tearing pain to suggest dissection. Continue to monitor.   Plan for admission for critical hypokalemia.  00:50: Nursing staff informed me patient HR back in 120s consistently- cardizem started  01:40: HR improved: 95-105.   01:43: CONSULT: Discussed w/ hospitalist Dr. Marlowe Sax- accepts admission.   Findings and plan of care discussed with supervising physician Dr. Stark Jock who has evaluated the patient & is in agreement.    Final Clinical Impressions(s) / ED Diagnoses   Final diagnoses:  Hypokalemia  Atrial fibrillation with RVR [facility])    ED Discharge Orders    None       Amaryllis Dyke, PA-C 11/13/18 0146    Veryl Speak, MD 11/13/18 2315

## 2018-11-12 NOTE — ED Notes (Signed)
Hx af

## 2018-11-12 NOTE — ED Triage Notes (Signed)
The pt arrived by [facility] from home pt c/o generalized weakness for 6 weeks  More weakness for 3-4 days

## 2018-11-13 DIAGNOSIS — I5032 Chronic diastolic (congestive) heart failure: Secondary | ICD-10-CM | POA: Diagnosis not present

## 2018-11-13 DIAGNOSIS — Z743 Need for continuous supervision: Secondary | ICD-10-CM | POA: Diagnosis not present

## 2018-11-13 DIAGNOSIS — L89150 Pressure ulcer of sacral region, unstageable: Secondary | ICD-10-CM | POA: Diagnosis present

## 2018-11-13 DIAGNOSIS — E785 Hyperlipidemia, unspecified: Secondary | ICD-10-CM | POA: Diagnosis present

## 2018-11-13 DIAGNOSIS — I5033 Acute on chronic diastolic (congestive) heart failure: Secondary | ICD-10-CM | POA: Diagnosis present

## 2018-11-13 DIAGNOSIS — M1990 Unspecified osteoarthritis, unspecified site: Secondary | ICD-10-CM | POA: Diagnosis present

## 2018-11-13 DIAGNOSIS — U071 COVID-19: Secondary | ICD-10-CM | POA: Diagnosis present

## 2018-11-13 DIAGNOSIS — M545 Low back pain: Secondary | ICD-10-CM | POA: Diagnosis present

## 2018-11-13 DIAGNOSIS — E1151 Type 2 diabetes mellitus with diabetic peripheral angiopathy without gangrene: Secondary | ICD-10-CM | POA: Diagnosis present

## 2018-11-13 DIAGNOSIS — I4821 Permanent atrial fibrillation: Secondary | ICD-10-CM | POA: Diagnosis present

## 2018-11-13 DIAGNOSIS — D72829 Elevated white blood cell count, unspecified: Secondary | ICD-10-CM

## 2018-11-13 DIAGNOSIS — G8929 Other chronic pain: Secondary | ICD-10-CM | POA: Diagnosis present

## 2018-11-13 DIAGNOSIS — I110 Hypertensive heart disease with heart failure: Secondary | ICD-10-CM | POA: Diagnosis present

## 2018-11-13 DIAGNOSIS — R0902 Hypoxemia: Secondary | ICD-10-CM | POA: Diagnosis not present

## 2018-11-13 DIAGNOSIS — G541 Lumbosacral plexus disorders: Secondary | ICD-10-CM | POA: Diagnosis present

## 2018-11-13 DIAGNOSIS — E1129 Type 2 diabetes mellitus with other diabetic kidney complication: Secondary | ICD-10-CM | POA: Diagnosis not present

## 2018-11-13 DIAGNOSIS — F411 Generalized anxiety disorder: Secondary | ICD-10-CM | POA: Diagnosis present

## 2018-11-13 DIAGNOSIS — I499 Cardiac arrhythmia, unspecified: Secondary | ICD-10-CM | POA: Diagnosis not present

## 2018-11-13 DIAGNOSIS — F418 Other specified anxiety disorders: Secondary | ICD-10-CM | POA: Diagnosis present

## 2018-11-13 DIAGNOSIS — I951 Orthostatic hypotension: Secondary | ICD-10-CM | POA: Diagnosis not present

## 2018-11-13 DIAGNOSIS — R279 Unspecified lack of coordination: Secondary | ICD-10-CM | POA: Diagnosis not present

## 2018-11-13 DIAGNOSIS — Z23 Encounter for immunization: Secondary | ICD-10-CM | POA: Diagnosis not present

## 2018-11-13 DIAGNOSIS — Z20828 Contact with and (suspected) exposure to other viral communicable diseases: Secondary | ICD-10-CM | POA: Diagnosis present

## 2018-11-13 DIAGNOSIS — G2581 Restless legs syndrome: Secondary | ICD-10-CM | POA: Diagnosis present

## 2018-11-13 DIAGNOSIS — E1142 Type 2 diabetes mellitus with diabetic polyneuropathy: Secondary | ICD-10-CM | POA: Diagnosis present

## 2018-11-13 DIAGNOSIS — M25852 Other specified joint disorders, left hip: Secondary | ICD-10-CM | POA: Diagnosis not present

## 2018-11-13 DIAGNOSIS — M797 Fibromyalgia: Secondary | ICD-10-CM | POA: Diagnosis present

## 2018-11-13 DIAGNOSIS — I482 Chronic atrial fibrillation, unspecified: Secondary | ICD-10-CM | POA: Diagnosis not present

## 2018-11-13 DIAGNOSIS — D649 Anemia, unspecified: Secondary | ICD-10-CM | POA: Diagnosis present

## 2018-11-13 DIAGNOSIS — I872 Venous insufficiency (chronic) (peripheral): Secondary | ICD-10-CM | POA: Diagnosis present

## 2018-11-13 DIAGNOSIS — E875 Hyperkalemia: Secondary | ICD-10-CM | POA: Diagnosis present

## 2018-11-13 DIAGNOSIS — I4891 Unspecified atrial fibrillation: Secondary | ICD-10-CM | POA: Diagnosis not present

## 2018-11-13 DIAGNOSIS — E876 Hypokalemia: Secondary | ICD-10-CM | POA: Diagnosis not present

## 2018-11-13 DIAGNOSIS — E1140 Type 2 diabetes mellitus with diabetic neuropathy, unspecified: Secondary | ICD-10-CM | POA: Diagnosis present

## 2018-11-13 DIAGNOSIS — R296 Repeated falls: Secondary | ICD-10-CM | POA: Diagnosis present

## 2018-11-13 DIAGNOSIS — G4733 Obstructive sleep apnea (adult) (pediatric): Secondary | ICD-10-CM | POA: Diagnosis present

## 2018-11-13 LAB — URINALYSIS, ROUTINE W REFLEX MICROSCOPIC
Bilirubin Urine: NEGATIVE
Glucose, UA: NEGATIVE mg/dL
Hgb urine dipstick: NEGATIVE
Ketones, ur: NEGATIVE mg/dL
Leukocytes,Ua: NEGATIVE
Nitrite: NEGATIVE
Protein, ur: NEGATIVE mg/dL
Specific Gravity, Urine: 1.013 (ref 1.005–1.030)
pH: 6 (ref 5.0–8.0)

## 2018-11-13 LAB — CBC
HCT: 40.3 % (ref 36.0–46.0)
Hemoglobin: 12.5 g/dL (ref 12.0–15.0)
MCH: 25.1 pg — ABNORMAL LOW (ref 26.0–34.0)
MCHC: 31 g/dL (ref 30.0–36.0)
MCV: 80.9 fL (ref 80.0–100.0)
Platelets: 195 10*3/uL (ref 150–400)
RBC: 4.98 MIL/uL (ref 3.87–5.11)
RDW: 17.2 % — ABNORMAL HIGH (ref 11.5–15.5)
WBC: 11.6 10*3/uL — ABNORMAL HIGH (ref 4.0–10.5)
nRBC: 0 % (ref 0.0–0.2)

## 2018-11-13 LAB — BASIC METABOLIC PANEL
Anion gap: 14 (ref 5–15)
BUN: 20 mg/dL (ref 8–23)
CO2: 27 mmol/L (ref 22–32)
Calcium: 8.6 mg/dL — ABNORMAL LOW (ref 8.9–10.3)
Chloride: 93 mmol/L — ABNORMAL LOW (ref 98–111)
Creatinine, Ser: 0.99 mg/dL (ref 0.44–1.00)
GFR calc Af Amer: >60 mL/min (ref >60)
GFR calc non Af Amer: 59 mL/min — ABNORMAL LOW (ref >60)
Glucose, Bld: 314 mg/dL — ABNORMAL HIGH (ref 70–99)
Potassium: 2.2 mmol/L — CL (ref 3.5–5.1)
Sodium: 134 mmol/L — ABNORMAL LOW (ref 135–145)

## 2018-11-13 LAB — HIV ANTIBODY (ROUTINE TESTING W REFLEX): HIV Screen 4th Generation wRfx: NONREACTIVE

## 2018-11-13 LAB — HEMOGLOBIN A1C
Hgb A1c MFr Bld: 7.5 % — ABNORMAL HIGH (ref 4.8–5.6)
Mean Plasma Glucose: 168.55 mg/dL

## 2018-11-13 LAB — GLUCOSE, CAPILLARY
Glucose-Capillary: 347 mg/dL — ABNORMAL HIGH (ref 70–99)
Glucose-Capillary: 339 mg/dL — ABNORMAL HIGH (ref 70–99)
Glucose-Capillary: 338 mg/dL — ABNORMAL HIGH (ref 70–99)

## 2018-11-13 LAB — MAGNESIUM: Magnesium: 2.1 mg/dL (ref 1.7–2.4)

## 2018-11-13 LAB — SARS CORONAVIRUS 2 BY RT PCR (HOSPITAL ORDER, PERFORMED IN ~~LOC~~ HOSPITAL LAB): SARS Coronavirus 2: NEGATIVE

## 2018-11-13 LAB — POTASSIUM: Potassium: 2.5 mmol/L — CL (ref 3.5–5.1)

## 2018-11-13 MED ORDER — INSULIN DETEMIR 100 UNIT/ML ~~LOC~~ SOLN
60.0000 [IU] | Freq: Every day | SUBCUTANEOUS | Status: DC
  Administered 2018-11-13 – 2018-11-17 (×5): 60 [IU] via SUBCUTANEOUS
  Administered 2018-11-18: 22:00:00 30 [IU] via SUBCUTANEOUS
  Filled 2018-11-13 (×7): qty 0.6

## 2018-11-13 MED ORDER — INSULIN ASPART 100 UNIT/ML ~~LOC~~ SOLN
0.0000 [IU] | Freq: Three times a day (TID) | SUBCUTANEOUS | Status: DC
  Administered 2018-11-13 (×2): 7 [IU] via SUBCUTANEOUS
  Administered 2018-11-13: 5 [IU] via SUBCUTANEOUS
  Administered 2018-11-14: 20:00:00 3 [IU] via SUBCUTANEOUS
  Administered 2018-11-14 (×2): 2 [IU] via SUBCUTANEOUS
  Administered 2018-11-15: 1 [IU] via SUBCUTANEOUS
  Administered 2018-11-15 (×2): 2 [IU] via SUBCUTANEOUS
  Administered 2018-11-16: 17:00:00 5 [IU] via SUBCUTANEOUS
  Administered 2018-11-16: 13:00:00 1 [IU] via SUBCUTANEOUS
  Administered 2018-11-17: 2 [IU] via SUBCUTANEOUS
  Administered 2018-11-17: 3 [IU] via SUBCUTANEOUS
  Administered 2018-11-17: 07:00:00 2 [IU] via SUBCUTANEOUS
  Administered 2018-11-18: 1 [IU] via SUBCUTANEOUS
  Administered 2018-11-18: 2 [IU] via SUBCUTANEOUS
  Administered 2018-11-18: 17:00:00 1 [IU] via SUBCUTANEOUS
  Administered 2018-11-19: 3 [IU] via SUBCUTANEOUS
  Administered 2018-11-19: 07:00:00 1 [IU] via SUBCUTANEOUS

## 2018-11-13 MED ORDER — WARFARIN - PHARMACIST DOSING INPATIENT
Freq: Every day | Status: DC
  Administered 2018-11-14 – 2018-11-17 (×4)

## 2018-11-13 MED ORDER — PRAMIPEXOLE DIHYDROCHLORIDE 0.125 MG PO TABS
0.1250 mg | ORAL_TABLET | Freq: Every day | ORAL | Status: DC
  Administered 2018-11-13 – 2018-11-18 (×5): 0.125 mg via ORAL
  Filled 2018-11-13 (×7): qty 1

## 2018-11-13 MED ORDER — FUROSEMIDE 40 MG PO TABS
40.0000 mg | ORAL_TABLET | Freq: Every day | ORAL | Status: DC
  Administered 2018-11-13: 40 mg via ORAL
  Filled 2018-11-13: qty 1

## 2018-11-13 MED ORDER — SPIRONOLACTONE 25 MG PO TABS
25.0000 mg | ORAL_TABLET | Freq: Every day | ORAL | Status: DC
  Administered 2018-11-13 – 2018-11-16 (×4): 25 mg via ORAL
  Filled 2018-11-13 (×4): qty 1

## 2018-11-13 MED ORDER — WARFARIN SODIUM 7.5 MG PO TABS
7.5000 mg | ORAL_TABLET | ORAL | Status: DC
  Administered 2018-11-14 – 2018-11-17 (×2): 7.5 mg via ORAL
  Filled 2018-11-13 (×2): qty 1

## 2018-11-13 MED ORDER — FERROUS SULFATE 325 (65 FE) MG PO TABS
325.0000 mg | ORAL_TABLET | ORAL | Status: DC
  Administered 2018-11-14 – 2018-11-17 (×3): 325 mg via ORAL
  Filled 2018-11-13 (×3): qty 1

## 2018-11-13 MED ORDER — POTASSIUM CHLORIDE CRYS ER 20 MEQ PO TBCR
40.0000 meq | EXTENDED_RELEASE_TABLET | ORAL | Status: AC
  Administered 2018-11-13 (×3): 40 meq via ORAL
  Filled 2018-11-13 (×3): qty 2

## 2018-11-13 MED ORDER — ALPRAZOLAM 0.25 MG PO TABS
0.2500 mg | ORAL_TABLET | Freq: Three times a day (TID) | ORAL | Status: DC
  Administered 2018-11-13 – 2018-11-19 (×15): 0.25 mg via ORAL
  Filled 2018-11-13 (×17): qty 1

## 2018-11-13 MED ORDER — MELATONIN 3 MG PO TABS
3.0000 mg | ORAL_TABLET | Freq: Every day | ORAL | Status: DC
  Administered 2018-11-13 – 2018-11-18 (×6): 3 mg via ORAL
  Filled 2018-11-13 (×7): qty 1

## 2018-11-13 MED ORDER — DOCUSATE SODIUM 100 MG PO CAPS
100.0000 mg | ORAL_CAPSULE | Freq: Every day | ORAL | Status: DC | PRN
  Administered 2018-11-14: 10:00:00 100 mg via ORAL
  Filled 2018-11-13: qty 1

## 2018-11-13 MED ORDER — POTASSIUM CHLORIDE CRYS ER 20 MEQ PO TBCR
40.0000 meq | EXTENDED_RELEASE_TABLET | ORAL | Status: AC
  Administered 2018-11-13 (×2): 40 meq via ORAL
  Filled 2018-11-13 (×2): qty 2

## 2018-11-13 MED ORDER — GABAPENTIN 300 MG PO CAPS
600.0000 mg | ORAL_CAPSULE | Freq: Three times a day (TID) | ORAL | Status: DC
  Administered 2018-11-13 – 2018-11-17 (×13): 600 mg via ORAL
  Filled 2018-11-13 (×13): qty 2

## 2018-11-13 MED ORDER — POTASSIUM CHLORIDE 10 MEQ/100ML IV SOLN
10.0000 meq | Freq: Once | INTRAVENOUS | Status: AC
  Administered 2018-11-13: 10 meq via INTRAVENOUS

## 2018-11-13 MED ORDER — ACETAMINOPHEN 325 MG PO TABS
650.0000 mg | ORAL_TABLET | Freq: Once | ORAL | Status: AC
  Administered 2018-11-13: 650 mg via ORAL

## 2018-11-13 MED ORDER — MAGNESIUM OXIDE 400 (241.3 MG) MG PO TABS
400.0000 mg | ORAL_TABLET | Freq: Three times a day (TID) | ORAL | Status: DC
  Administered 2018-11-13 – 2018-11-19 (×18): 400 mg via ORAL
  Filled 2018-11-13 (×19): qty 1

## 2018-11-13 MED ORDER — VITAMIN C 500 MG PO TABS
500.0000 mg | ORAL_TABLET | Freq: Every day | ORAL | Status: DC
  Administered 2018-11-13 – 2018-11-19 (×7): 500 mg via ORAL
  Filled 2018-11-13 (×7): qty 1

## 2018-11-13 MED ORDER — POTASSIUM CHLORIDE 10 MEQ/100ML IV SOLN
10.0000 meq | INTRAVENOUS | Status: AC
  Administered 2018-11-13 (×2): 10 meq via INTRAVENOUS
  Filled 2018-11-13 (×2): qty 100

## 2018-11-13 MED ORDER — BUSPIRONE HCL 10 MG PO TABS
10.0000 mg | ORAL_TABLET | Freq: Two times a day (BID) | ORAL | Status: DC
  Administered 2018-11-13 – 2018-11-19 (×13): 10 mg via ORAL
  Filled 2018-11-13 (×13): qty 1

## 2018-11-13 MED ORDER — OXYCODONE HCL 5 MG PO TABS
5.0000 mg | ORAL_TABLET | Freq: Three times a day (TID) | ORAL | Status: DC | PRN
  Administered 2018-11-13 – 2018-11-18 (×5): 5 mg via ORAL
  Filled 2018-11-13 (×5): qty 1

## 2018-11-13 MED ORDER — METOPROLOL SUCCINATE ER 50 MG PO TB24
50.0000 mg | ORAL_TABLET | Freq: Every day | ORAL | Status: DC
  Administered 2018-11-13: 50 mg via ORAL
  Filled 2018-11-13: qty 1

## 2018-11-13 MED ORDER — POTASSIUM CHLORIDE 10 MEQ/100ML IV SOLN
10.0000 meq | INTRAVENOUS | Status: AC
  Administered 2018-11-13 (×3): 10 meq via INTRAVENOUS
  Filled 2018-11-13 (×4): qty 100

## 2018-11-13 MED ORDER — WARFARIN SODIUM 5 MG PO TABS
5.0000 mg | ORAL_TABLET | ORAL | Status: DC
  Administered 2018-11-13 – 2018-11-16 (×3): 5 mg via ORAL
  Filled 2018-11-13 (×3): qty 1

## 2018-11-13 MED ORDER — EZETIMIBE 10 MG PO TABS
10.0000 mg | ORAL_TABLET | Freq: Every day | ORAL | Status: DC
  Administered 2018-11-13 – 2018-11-18 (×6): 10 mg via ORAL
  Filled 2018-11-13 (×6): qty 1

## 2018-11-13 MED ORDER — ACETAMINOPHEN 500 MG PO TABS
500.0000 mg | ORAL_TABLET | Freq: Four times a day (QID) | ORAL | Status: DC | PRN
  Administered 2018-11-15 – 2018-11-19 (×8): 500 mg via ORAL
  Filled 2018-11-13 (×9): qty 1

## 2018-11-13 MED ORDER — TIZANIDINE HCL 4 MG PO TABS
4.0000 mg | ORAL_TABLET | Freq: Three times a day (TID) | ORAL | Status: DC
  Administered 2018-11-13 – 2018-11-18 (×14): 4 mg via ORAL
  Filled 2018-11-13 (×16): qty 1

## 2018-11-13 MED ORDER — POTASSIUM CHLORIDE CRYS ER 20 MEQ PO TBCR
40.0000 meq | EXTENDED_RELEASE_TABLET | Freq: Once | ORAL | Status: AC
  Administered 2018-11-13: 40 meq via ORAL
  Filled 2018-11-13: qty 2

## 2018-11-13 NOTE — H&P (Signed)
History and Physical    Donna [name] PXT:[id number] DOB: Mar 10, 1950 DOA: 11/12/2018  PCP: Janine Limbo, PA-C Patient coming from: Home  Chief Complaint: Generalized weakness  HPI: Donna [name] is a 68 y.o. female with medical history significant of permanent atrial fibrillation on Coumadin and amiodarone, chronic diastolic congestive heart failure, type 2 diabetes, diabetic lumbosacral plexopathy, OSA on CPAP, RLS presenting to the hospital with a chief complaint of generalized weakness.  Patient states she had a fall several months ago and since then has been feeling weak all over.  At home her husband has to help her with chores.  Reports having lower substernal chest pain about a week ago but could not give any further details.  States she has not had chest pain since then.  Denies shortness of breath or heart palpitations.  States she was taking previously taking metoprolol and amiodarone but these were stopped back in March by her cardiologist because he had shocked her heart back into rhythm.  She is currently taking Lasix 40 mg daily and metolazone 10 mg once a week.  States spironolactone also stopped by her cardiologist several months ago.  Reports taking a 6 tablets of potassium 20 mEq daily.  Denies vomiting or diarrhea.  No other complaints.  ED Course: Found to be in A. fib with RVR with rate up to 130s.  No fever or hypotension.  White count 13.4.  Potassium <2.0.  EKG with new QT prolongation (QTc 567)..  Blood glucose 345.  Bicarb 30, anion gap 19.  BUN 17, creatinine 1.1.  High-sensitivity troponin flat 19>19.  INR 2.4.  UA pending.  Magnesium level pending.  SARS-CoV-2 test pending.  Chest x-ray showing no active cardiopulmonary disease. Received Cardizem bolus and started on infusion.  IV magnesium 2 g.  Oral potassium 60 mEq and IV potassium 10 mEq x 4.  Review of Systems:  All systems reviewed and apart from history of presenting illness, are negative.  Past Medical History:   Diagnosis Date  . CHF (congestive heart failure) ([place])   . Chronic /permanent atrial fibrillation ([place]) 2005   On Warfarin (follwed @ [facility]); Rate controlled - On BB  . Chronic diastolic heart failure, NYHA class 2 ([place])    Updated by A. fib, hypertensive heart disease and obesity; EDP was only 7 by cardiac catheterization in September 2017  . Diabetic lumbosacral plexopathy ([place])   . Fibromyalgia   . Generalized anxiety disorder   . Hypokalemia 09/03/2017  . Incontinence   . Major depressive disorder   . OSA on CPAP   . Restless leg syndrome   . Syncope 09/03/2017  . Type II diabetes mellitus ([place])     Past Surgical History:  Procedure Laterality Date  . CARDIAC CATHETERIZATION  11/09/2015   [facility]: Nonocclusive CAD. EF 55%. EDP 7 mmHg.  Marland Kitchen CHOLECYSTECTOMY OPEN    . JOINT REPLACEMENT    . KNEE ARTHROSCOPY Right   . MASS EXCISION Left    sarcoma - shoulder  . SHOULDER OPEN ROTATOR CUFF REPAIR Bilateral   . TONSILLECTOMY    . TOTAL HIP ARTHROPLASTY Right 2015  . TOTAL HIP REVISION Right 2015  . TOTAL KNEE ARTHROPLASTY Right   . TRANSTHORACIC ECHOCARDIOGRAM  11/2013; 02/2016   a) [facility]: with Definity:  EF 60-65%. Mod LA dilation. Mild AoV sclerosis. Normal RHP. Indeterminate LV filling pressures. b) [facility]: Normal cavity size. EF 55-60%.no RWMA, mild AoV sclerosis (R cusp), Mod MAC. Mild  biAtrial dilation.  . TRANSTHORACIC ECHOCARDIOGRAM  08/2017    Mild LVH with normal EF 55-60%.  Unable to fully assess wall motion, but appear to be normal.  Unable to determine diastolic function because of A. fib.  Mild to moderate MAC with mild mitral stenosis (mean gradient 9 mmHg).  Moderate LA dilation.  CVP estimated 3 mmHg.  No obvious evidence of pulmonary hypertension.  Marland Kitchen VAGINAL HYSTERECTOMY       reports that she has never smoked. She has never used smokeless tobacco. She reports that she does not drink alcohol or use drugs.  Allergies   Allergen Reactions  . Bee Venom Hives    Takes benadryl  . Other Other (See Comments)    Raw foods with seeds give her "boils".  Avoids raw strawberries, blueberries. Tolerates cooked fruits, tomato sauce, bread/grains with seeds. [facility] 10/17/13 Berries with seeds Allergy to glutamine-C-quercet-selen-brom per [facility] 09/25/17  . Alteplase Rash  . Cefepime Rash  . Fentanyl Other (See Comments)    Hallucinations, syncope    . Metformin And Related Diarrhea    Family History  Problem Relation Age of Onset  . Hypertension Mother   . Heart disease Mother   . Leukemia Son     Prior to Admission medications   Medication Sig Start Date End Date Taking? Authorizing Provider  acetaminophen (TYLENOL) 500 MG tablet Take 500 mg by mouth every 6 (six) hours as needed for mild pain.    Yes [provider]  ALPRAZolam (XANAX) 0.25 MG tablet Take 1 tablet (0.25 mg total) by mouth 3 (three) times daily. 09/07/17  Yes Sheikh, Omair Latif, DO  busPIRone (BUSPAR) 10 MG tablet Take 10 mg by mouth 2 (two) times daily.    Yes [provider]  ciprofloxacin (CIPRO) 250 MG tablet Take 250 mg by mouth every morning.   Yes [provider]  docusate sodium (COLACE) 100 MG capsule Take 100 mg by mouth daily as needed for mild constipation.   Yes [provider]  Dulaglutide (TRULICITY) 1.5 TW/6.5KC SOPN Inject 1.5 mg into the skin every Saturday.    Yes [provider]  ezetimibe (ZETIA) 10 MG tablet Take 10 mg by mouth at bedtime.    Yes [provider]  ferrous sulfate 325 (65 FE) MG tablet Take 325 mg by mouth See admin instructions. Take one tablet (325 mg) by mouth on Sunday, Monday, Wednesday and Friday mornings.   Yes [provider]  furosemide (LASIX) 40 MG tablet Take 40 mg by mouth daily.   Yes [provider]  gabapentin (NEURONTIN) 600 MG tablet Take 600 mg by mouth 3 (three) times daily.   Yes [provider]  guaiFENesin  (MUCINEX) 600 MG 12 hr tablet Take 600 mg by mouth 2 (two) times daily as needed for cough or to loosen phlegm.    Yes [provider]  insulin detemir (LEVEMIR) 100 UNIT/ML injection Inject 0.4 mLs (40 Units total) into the skin at bedtime. Patient taking differently: Inject 60 Units into the skin at bedtime.  09/30/17  Yes Georgette Shell, MD  insulin lispro (HUMALOG KWIKPEN) 100 UNIT/ML KiwkPen Inject 6-14 Units into the skin See admin instructions. Inject 6-14 units subcutaneously before each meal per sliding scale: CBG 201-250 6 units, 251-300 8 units, 301-350 10 units, 351-400 12 units, over 400 14 units and call MD   Yes [provider]  lidocaine (LIDODERM) 5 % Place 1 patch onto the skin daily as needed (right  hip pain). Remove & Discard patch within 12 hours or as directed by MD    Yes [provider]  magnesium oxide (MAG-OX) 400 MG tablet Take 1 tablet (400 mg total) by mouth 3 (three) times daily. ALSO TAKE A EXTRA 400 MG TABLET ON THE DAYS YOU TAKE ZAROXOLYN TABLET 02/02/18  Yes Leonie Man, MD  Melatonin 5 MG CAPS Take 5 mg by mouth at bedtime.   Yes [provider]  metolazone (ZAROXOLYN) 10 MG tablet Take 10 mg by mouth once a week.    Yes [provider]  omeprazole (PRILOSEC) 20 MG capsule Take 20 mg by mouth daily.   Yes [provider]  ondansetron (ZOFRAN) 4 MG tablet Take 1 tablet (4 mg total) by mouth every 6 (six) hours as needed for nausea. 09/30/17  Yes Georgette Shell, MD  OxyCODONE HCl, Abuse Deter, (OXAYDO) 5 MG TABA Take 1 tablet by mouth 3 (three) times daily as needed (pain). Patient taking differently: Take 1 tablet by mouth 3 (three) times daily as needed (moderate pain).  09/07/17  Yes Sheikh, Omair Latif, DO  potassium chloride SA (K-DUR,KLOR-CON) 20 MEQ tablet Take 5 tablets (100 mEq total) by mouth daily. TAKE AN EXTRA 20 MEQ ON THE DAYS YOU TAKE ZAROXLYN Patient taking differently: Take 40 mEq by  mouth 3 (three) times daily.  02/02/18  Yes Leonie Man, MD  pramipexole (MIRAPEX) 0.125 MG tablet Take 0.125 mg by mouth at bedtime.    Yes [provider]  tiZANidine (ZANAFLEX) 4 MG tablet Take 4 mg by mouth 3 (three) times daily.    Yes [provider]  VENLAFAXINE HCL ER PO Take 100 mg by mouth daily with breakfast.    Yes [provider]  vitamin C (ASCORBIC ACID) 500 MG tablet Take 500 mg by mouth daily.   Yes [provider]  warfarin (COUMADIN) 7.5 MG tablet Take 5-7.5 mg by mouth daily. Take 7.5 mg on Sunday and Wednesday and 5 mg all other days   Yes [provider]  amiodarone (PACERONE) 200 MG tablet Take 1 tablet (200 mg total) by mouth daily. Patient not taking: Reported on 11/13/2018 07/03/18   Leonie Man, MD  spironolactone (ALDACTONE) 25 MG tablet Take 1 tablet (25 mg total) by mouth daily. 05/12/18 08/10/18  Leonie Man, MD    Physical Exam: Vitals:   11/13/18 0500 11/13/18 0515 11/13/18 0530 11/13/18 0614  BP: 114/69 123/72 121/64 128/65  Pulse: (!) 112 (!) 105 92 93  Resp: _0 Temp:    98.1 F (36.7 C)  TempSrc:    Oral  SpO2: 93% 93% 94% 98%  Weight:    118.1 kg  Height:        Physical Exam  Constitutional: She is oriented to person, place, and time. She appears well-developed and well-nourished. No distress.  HENT:  Head: Normocephalic.  Mouth/Throat: Oropharynx is clear and moist.  Eyes: Right eye exhibits no discharge. Left eye exhibits no discharge.  Neck: Neck supple.  Cardiovascular: Normal rate and intact distal pulses.  Irregularly irregular rhythm  Pulmonary/Chest: Effort normal and breath sounds normal. No respiratory distress. She has no wheezes. She has no rales.  Abdominal: Soft. Bowel sounds are normal. She exhibits no distension. There is no abdominal tenderness. There is no guarding.  Musculoskeletal:        General: No tenderness.  Neurological: She is alert and oriented to  person, place, and time.  Skin: Skin is warm and dry. She is not diaphoretic.     Labs on Admission: I have personally reviewed following labs and imaging studies  CBC: Recent Labs  Lab 11/12/18 1739 11/13/18 0459  WBC 13.4* 11.6*  HGB 14.7 12.5  HCT 47.6* 40.3  MCV 81.2 80.9  PLT 259 270   Basic Metabolic Panel: Recent Labs  Lab 11/12/18 1739 11/13/18 0459  NA 136 134*  K <2.0* 2.2*  CL 87* 93*  CO2 30 27  GLUCOSE 345* 314*  BUN 17 20  CREATININE 1.14* 0.99  CALCIUM 9.7 8.6*  MG  --  2.1   GFR: Estimated Creatinine Clearance: 78.1 mL/min (by C-G formula based on SCr of 0.99 mg/dL). Liver Function Tests: No results for input(s): AST, ALT, ALKPHOS, BILITOT, PROT, ALBUMIN in the last 168 hours. No results for input(s): LIPASE, AMYLASE in the last 168 hours. No results for input(s): AMMONIA in the last 168 hours. Coagulation Profile: Recent Labs  Lab 11/12/18 1739  INR 2.4*   Cardiac Enzymes: No results for input(s): CKTOTAL, CKMB, CKMBINDEX, TROPONINI in the last 168 hours. BNP (last 3 results) No results for input(s): PROBNP in the last 8760 hours. HbA1C: Recent Labs    11/13/18 0459  HGBA1C 7.5*   CBG: No results for input(s): GLUCAP in the last 168 hours. Lipid Profile: No results for input(s): CHOL, HDL, LDLCALC, TRIG, CHOLHDL, LDLDIRECT in the last 72 hours. Thyroid Function Tests: No results for input(s): TSH, T4TOTAL, FREET4, T3FREE, THYROIDAB in the last 72 hours. Anemia Panel: No results for input(s): VITAMINB12, FOLATE, FERRITIN, TIBC, IRON, RETICCTPCT in the last 72 hours. Urine analysis:    Component Value Date/Time   COLORURINE YELLOW 10/24/2017 0353   APPEARANCEUR CLEAR 10/24/2017 0353   LABSPEC 1.011 10/24/2017 0353   PHURINE 8.0 10/24/2017 0353   GLUCOSEU NEGATIVE 10/24/2017 0353   HGBUR NEGATIVE 10/24/2017 0353   BILIRUBINUR NEGATIVE 10/24/2017 0353   KETONESUR NEGATIVE 10/24/2017 0353   PROTEINUR NEGATIVE 10/24/2017 0353    UROBILINOGEN 1.0 09/06/2010 1130   NITRITE POSITIVE (A) 10/24/2017 0353   LEUKOCYTESUR TRACE (A) 10/24/2017 0353    Radiological Exams on Admission: Dg Chest 2 View  Result Date: 11/12/2018 CLINICAL DATA:  Weakness for 6 weeks. EXAM: CHEST - 2 VIEW COMPARISON:  09/27/2017 and prior radiographs FINDINGS: UPPER limits normal heart size and mild hilar prominence unchanged from remote studies. There is no evidence of focal airspace disease, pulmonary edema, suspicious pulmonary nodule/mass, pleural effusion, or pneumothorax. No acute bony abnormalities are identified. IMPRESSION: No active cardiopulmonary disease. Electronically Signed   By: Margarette Canada M.D.   On: 11/12/2018 18:49    EKG: Independently reviewed.  A. fib with RVR, heart rate 126.  QTc 567.  QT prolongation is new.  Assessment/Plan Principal Problem:   Hypokalemia Active Problems:   Depression   Type 2 diabetes mellitus (HCC)   Chronic atrial fibrillation with RVR   Leukocytosis   Severe hypokalemia, new QT prolongation Potassium <2.0.  EKG with new QT prolongation (QTc 567).Currently taking Lasix 40 mg daily and metolazone 10 mg once a week.  She takes a total of 120 mEq of potassium supplementation at home daily.  Per cardiology note from March 2020, patient is supposed to be on spironolactone.  Telephone note from April 2020 mentions holding spironolactone temporarily for 4 to 5 days.  It seems the patient has not taken this medication since then.  This is also likely contributing to her severe hypokalemia. -Cardiac monitoring -Continue  potassium repletion -Received IV magnesium 2 g.  Repeat magnesium level normal. -Repeat EKG in a.m. -Avoid QT prolonging drugs if possible -Keep potassium above 4 and magnesium above 2  A. fib with RVR Rate up to 130s in the ED, currently improved.  Likely precipitated by severe hypokalemia and patient currently not taking any rate control agent.  Per cardiology documentation from March  2020, low-dose metoprolol was stopped because it was not able to adequately control her rate but patient was advised to continue amiodarone.  Telephone note from April 2020 mentions holding her rate control agent.  It seems since then she has not been on amiodarone. -INR therapeutic at 2.4.  Continue Coumadin per pharmacy for anticoagulation -Continue Cardizem infusion for rate control at this time.  Consider transitioning to p.o. rate control agent if rate remains stable for a few hours. -Do not resume amiodarone at this time given QT prolongation  Mild leukocytosis Afebrile.  White count 13.4.  Chest x-ray not suggestive of pneumonia.  Not endorsing any UTI symptoms. -UA pending -Continue to monitor white count  Insulin-dependent diabetes mellitus Blood glucose 345.  No signs of DKA. -Check A1c.  Sliding scale insulin and CBG checks.  Continue home Levemir 60 units at bedtime.  Depression and anxiety -Continue home BuSpar, Xanax -Hold home Zoloft and Effexor at this time given new QT prolongation  OSA -Continue CPAP  DVT prophylaxis: Coumadin Code Status: Patient wishes to be full code. Family Communication: No family available at this time. Disposition Plan: Anticipate discharge after clinical improvement. Consults called: None Admission status: It is my clinical opinion that referral for OBSERVATION is reasonable and necessary in this patient based on the above information provided. The aforementioned taken together are felt to place the patient at high risk for further clinical deterioration. However it is anticipated that the patient may be medically stable for discharge from the hospital within 24 to 48 hours.  The medical decision making on this patient was of high complexity and the patient is at high risk for clinical deterioration, therefore this is a level 3 visit.  Shela Leff MD [facility] Pager [phone]  If 7PM-7AM, please contact night-coverage  [email or web address] Password [facility]  11/13/2018, 7:19 AM

## 2018-11-13 NOTE — Progress Notes (Signed)
Pt transferred from ED, appeared alert ad oriented x 4, Atrial fib on the monitor,  HR under controlled, BP stable, Room air, no acute distress on arrival. CHG bath given, CCMD called with 2nd person verified, call bell with in reach, all the equipments and room instructions given. Skin assessment done.   Kennyth Lose, RN

## 2018-11-13 NOTE — Progress Notes (Signed)
Patient is a 68 year-old female with history of A. fib on Coumadin and amiodarone, chronic diastolic congestive heart failure, type diabetes mellitus, diabetic lumbosacral plexopathy, OSA on CPAP, restless leg syndrome who presented with generalized weakness.  She reported of falling frequently at home and most of the time sits on the chair.  She follows with her cardiologist Dr. Ellyn Hack but has not taken metoprolol she was previously taking.  She also takes Lasix, metalazone at home and recently stopped taking spironolactone.  On presentation she was found to be severely hypokalemic .EKG showed QT prolongation of 567.  She was in A. fib with RVR and the rate of 130s.  Patient was started on Cardizem drip and supplemental potassium. Patient seen and examined the bedside this morning.  Currently hemodynamically stable.  Heart rate better controlled in the range of 90s.  She denies any chest pain or shortness of breath.  I will stop Cardizem drip and start on metoprolol.  I will request a physical therapy evaluation. Patient seen by Dr. Marlowe Sax this morning.

## 2018-11-13 NOTE — Progress Notes (Signed)
ANTICOAGULATION CONSULT NOTE - Initial Consult  Pharmacy Consult for Coumadin Indication: atrial fibrillation  Allergies  Allergen Reactions  . Bee Venom Hives    Takes benadryl  . Other Other (See Comments)    Raw foods with seeds give her "boils".  Avoids raw strawberries, blueberries. Tolerates cooked fruits, tomato sauce, bread/grains with seeds. [facility] 10/17/13 Berries with seeds Allergy to glutamine-C-quercet-selen-brom per [facility] 09/25/17  . Alteplase Rash  . Cefepime Rash  . Fentanyl Other (See Comments)    Hallucinations, syncope    . Metformin And Related Diarrhea    Patient Measurements: Height: 5\' 11"  (180.3 cm) Weight: 260 lb (117.9 kg) IBW/kg (Calculated) : 70.8  Vital Signs: Temp: 98.3 F (36.8 C) (09/11 2143) Temp Source: Oral (09/11 2143) BP: 115/64 (09/12 0200) Pulse Rate: 103 (09/12 0200)  Labs: Recent Labs    11/12/18 1739 11/12/18 2055  HGB 14.7  --   HCT 47.6*  --   PLT 259  --   LABPROT 25.8*  --   INR 2.4*  --   CREATININE 1.14*  --   TROPONINIHS 19* 19*    Estimated Creatinine Clearance: 67.7 mL/min (A) (by C-G formula based on SCr of 1.14 mg/dL (H)).   Medical History: Past Medical History:  Diagnosis Date  . CHF (congestive heart failure) ([place])   . Chronic /permanent atrial fibrillation ([place]) 2005   On Warfarin (follwed @ [facility]); Rate controlled - On BB  . Chronic diastolic heart failure, NYHA class 2 ([place])    Updated by A. fib, hypertensive heart disease and obesity; EDP was only 7 by cardiac catheterization in September 2017  . Diabetic lumbosacral plexopathy ([place])   . Fibromyalgia   . Generalized anxiety disorder   . Hypokalemia 09/03/2017  . Incontinence   . Major depressive disorder   . OSA on CPAP   . Restless leg syndrome   . Syncope 09/03/2017  . Type II diabetes mellitus (HCC)     Medications:  No current facility-administered medications on file prior to encounter.    Current Outpatient  Medications on File Prior to Encounter  Medication Sig Dispense Refill  . acetaminophen (TYLENOL) 500 MG tablet Take 500 mg by mouth every 6 (six) hours as needed for mild pain.     Marland Kitchen ALPRAZolam (XANAX) 0.25 MG tablet Take 1 tablet (0.25 mg total) by mouth 3 (three) times daily. 10 tablet 0  . busPIRone (BUSPAR) 10 MG tablet Take 10 mg by mouth 2 (two) times daily.     . ciprofloxacin (CIPRO) 250 MG tablet Take 250 mg by mouth every morning.    . docusate sodium (COLACE) 100 MG capsule Take 100 mg by mouth daily as needed for mild constipation.    . Dulaglutide (TRULICITY) 1.5 0000000 SOPN Inject 1.5 mg into the skin every Saturday.     . ezetimibe (ZETIA) 10 MG tablet Take 10 mg by mouth at bedtime.     . ferrous sulfate 325 (65 FE) MG tablet Take 325 mg by mouth See admin instructions. Take one tablet (325 mg) by mouth on Sunday, Monday, Wednesday and Friday mornings.    . furosemide (LASIX) 40 MG tablet Take 40 mg by mouth daily.    Marland Kitchen gabapentin (NEURONTIN) 600 MG tablet Take 600 mg by mouth 3 (three) times daily.    Marland Kitchen guaiFENesin (MUCINEX) 600 MG 12 hr tablet Take 600 mg by mouth 2 (two) times daily as needed for cough or to loosen phlegm.     Marland Kitchen  insulin detemir (LEVEMIR) 100 UNIT/ML injection Inject 0.4 mLs (40 Units total) into the skin at bedtime. (Patient taking differently: Inject 60 Units into the skin at bedtime. ) 10 mL 11  . insulin lispro (HUMALOG KWIKPEN) 100 UNIT/ML KiwkPen Inject 6-14 Units into the skin See admin instructions. Inject 6-14 units subcutaneously before each meal per sliding scale: CBG 201-250 6 units, 251-300 8 units, 301-350 10 units, 351-400 12 units, over 400 14 units and call MD    . lidocaine (LIDODERM) 5 % Place 1 patch onto the skin daily as needed (right hip pain). Remove & Discard patch within 12 hours or as directed by MD     . magnesium oxide (MAG-OX) 400 MG tablet Take 1 tablet (400 mg total) by mouth 3 (three) times daily. ALSO TAKE A EXTRA 400 MG TABLET  ON THE DAYS YOU TAKE ZAROXOLYN TABLET 300 tablet 0  . Melatonin 5 MG CAPS Take 5 mg by mouth at bedtime.    . metolazone (ZAROXOLYN) 10 MG tablet Take 10 mg by mouth once a week.     Marland Kitchen omeprazole (PRILOSEC) 20 MG capsule Take 20 mg by mouth daily.    . ondansetron (ZOFRAN) 4 MG tablet Take 1 tablet (4 mg total) by mouth every 6 (six) hours as needed for nausea. 20 tablet 0  . OxyCODONE HCl, Abuse Deter, (OXAYDO) 5 MG TABA Take 1 tablet by mouth 3 (three) times daily as needed (pain). (Patient taking differently: Take 1 tablet by mouth 3 (three) times daily as needed (moderate pain). ) 5 tablet 0  . potassium chloride SA (K-DUR,KLOR-CON) 20 MEQ tablet Take 5 tablets (100 mEq total) by mouth daily. TAKE AN EXTRA 20 MEQ ON THE DAYS YOU TAKE ZAROXLYN (Patient taking differently: Take 40 mEq by mouth 3 (three) times daily. ) 330 tablet 0  . pramipexole (MIRAPEX) 0.125 MG tablet Take 0.125 mg by mouth at bedtime.     . sertraline (ZOLOFT) 100 MG tablet Take 100 mg by mouth daily.    Marland Kitchen tiZANidine (ZANAFLEX) 4 MG tablet Take 4 mg by mouth 3 (three) times daily.     . VENLAFAXINE HCL ER PO Take 100 mg by mouth daily with breakfast.     . vitamin C (ASCORBIC ACID) 500 MG tablet Take 500 mg by mouth daily.    Marland Kitchen warfarin (COUMADIN) 7.5 MG tablet Take 5-7.5 mg by mouth daily. Take 7.5 mg on Sunday and Wednesday and 5 mg all other days    . [DISCONTINUED] sertraline (ZOLOFT) 100 MG tablet Take 100 mg by mouth daily.    Marland Kitchen amiodarone (PACERONE) 200 MG tablet Take 1 tablet (200 mg total) by mouth daily. (Patient not taking: Reported on 11/13/2018) 90 tablet 3  . spironolactone (ALDACTONE) 25 MG tablet Take 1 tablet (25 mg total) by mouth daily. (Patient not taking: Reported on 11/13/2018) 90 tablet 3  . [DISCONTINUED] Cholecalciferol (VITAMIN D) 2000 units tablet Take 4,000 Units by mouth daily.     . [DISCONTINUED] ciprofloxacin (CIPRO) 250 MG tablet     . [DISCONTINUED] miconazole (MICOTIN) 2 % powder Apply 1  application topically See admin instructions. Apply topically to abdominal folds twice daily    . [DISCONTINUED] Sulfamethoxazole-Trimethoprim (BACTRIM PO) Take 80 mg by mouth daily.    . [DISCONTINUED] Turmeric 500 MG CAPS Take 500 mg by mouth daily.        Assessment: 68 y.o. female admitted with weakness and hypokalemia, h/o Afib, to continue Coumadin   Goal of Therapy:  INR 2-3 Monitor platelets by anticoagulation protocol: Yes   Plan:  Continue home Coumadin regimen Daily INR  Caryl Pina 11/13/2018,2:29 AM

## 2018-11-13 NOTE — Progress Notes (Signed)
Asked patient if she uses a CPAP at home.  Patient stated that she has one, but she does not use it.  I asked if she would be willing to use one while here and patient shook her head no.  Patient sat is 97% at this time, no distress noted.  Will continue to monitor.

## 2018-11-13 NOTE — Evaluation (Signed)
Physical Therapy Evaluation Patient Details Name: Donna [name] MRN: [id number] DOB: 04-02-1950 Today's Date: 11/13/2018   History of Present Illness  Pt is a 68 y.o. female admitted 11/12/18 with generalized weakness. Found to be in afib with RVR, severe hypokalemia. PMH includes CHF, permanent afib, fibromyalgia, anxiety, depression, DM2, RLS.    Clinical Impression  Pt presents with an overall decrease in functional mobility secondary to above. PTA, pt was ambulating short distance with rollator, but has become increasingly weak with multiple falls, requiring assist from husband for all transfers and ADLs. Today, pt unable to stand, requiring maxA to lateral scoot to recliner. Pt anxious with mobility and fearful of falling. C/o dizziness upon sitting (see vitals below). Pt would benefit from continued acute PT services to maximize functional mobility and independence prior to d/c with SNF-level therapies; pt in agreement.  SpO2 >90% on RA  Supine BP 101/53 Seated BP 98/54 Post-transfer BP 131/55      Follow Up Recommendations SNF;Supervision/Assistance - 24 hour    Equipment Recommendations  (TBD)    Recommendations for Other Services       Precautions / Restrictions Precautions Precautions: Fall      Mobility  Bed Mobility Overal bed mobility: Needs Assistance Bed Mobility: Supine to Sit     Supine to sit: Max assist;HOB elevated     General bed mobility comments: MaxA to assist hips to EOB, grimacing in pain, cues to use bed rail and assist; anxious due to pain  Transfers Overall transfer level: Needs assistance   Transfers: Lateral/Scoot Transfers          Lateral/Scoot Transfers: Max assist General transfer comment: Attempted standing to RW, pt able to offload buttocks but unable to extend into upright posture despite maxA. Performed lateral scoot to drop arm recliner, pt requiring maxA and max verbal/cues encouragement for technique; pt initially scooting  well with min guard, then "giving up" halfway through transfer requiring maxA to scoot hips backwards to prevent slide to floor  Ambulation/Gait             General Gait Details: Unable to stand  Stairs            Wheelchair Mobility    Modified Rankin (Stroke Patients Only)       Balance Overall balance assessment: Needs assistance   Sitting balance-Leahy Scale: Fair       Standing balance-Leahy Scale: Zero                               Pertinent Vitals/Pain Pain Assessment: Faces Faces Pain Scale: Hurts even more Pain Location: Bilateral hips with mobility Pain Descriptors / Indicators: Grimacing;Guarding Pain Intervention(s): Monitored during session;Repositioned    Home Living Family/patient expects to be discharged to:: Skilled nursing facility                 Additional Comments: Lives with husband    Prior Function Level of Independence: Needs assistance   Gait / Transfers Assistance Needed: Limited household ambulation with RW vs use of w/c. Pt with increased difficulty past few weeks, unable to stand, husband assists with pivots from BSC<>chair  ADL's / Homemaking Assistance Needed: Has been using purewick at home. Husband assists with ADLs and household tasks        Hand Dominance        Extremity/Trunk Assessment   Upper Extremity Assessment Upper Extremity Assessment: Generalized weakness    Lower Extremity  Assessment Lower Extremity Assessment: Generalized weakness       Communication   Communication: Expressive difficulties(intermittent stuttering/difficulty finding words)  Cognition Arousal/Alertness: Awake/alert Behavior During Therapy: Anxious Overall Cognitive Status: No family/caregiver present to determine baseline cognitive functioning Area of Impairment: Attention;Problem solving                   Current Attention Level: Selective         Problem Solving: Requires verbal cues General  Comments: Pt anxious with mobility, requiring max encouragement and cues to stay on task; distracted by pain. Pt admites, "I do that, I just give up..." Seems to stutter on words, pt reports difficulty getting words out that has been going on for months      General Comments      Exercises     Assessment/Plan    PT Assessment Patient needs continued PT services  PT Problem List Decreased strength;Decreased range of motion;Decreased activity tolerance;Decreased balance;Decreased mobility;Decreased knowledge of use of DME;Pain;Cardiopulmonary status limiting activity;Obesity       PT Treatment Interventions DME instruction;Gait training;Functional mobility training;Therapeutic activities;Therapeutic exercise;Balance training;Patient/family education;Wheelchair mobility training    PT Goals (Current goals can be found in the Care Plan section)  Acute Rehab PT Goals Patient Stated Goal: Be able to stand and walk again PT Goal Formulation: With patient Time For Goal Achievement: 11/27/18 Potential to Achieve Goals: Fair    Frequency Min 2X/week   Barriers to discharge        Co-evaluation               AM-PAC PT "6 Clicks" Mobility  Outcome Measure Help needed turning from your back to your side while in a flat bed without using bedrails?: A Lot Help needed moving from lying on your back to sitting on the side of a flat bed without using bedrails?: A Lot Help needed moving to and from a bed to a chair (including a wheelchair)?: A Lot Help needed standing up from a chair using your arms (e.g., wheelchair or bedside chair)?: Total Help needed to walk in hospital room?: Total Help needed climbing 3-5 steps with a railing? : Total 6 Click Score: 9    End of Session Equipment Utilized During Treatment: Gait belt Activity Tolerance: Patient limited by fatigue Patient left: in chair;with call bell/phone within reach Nurse Communication: Mobility status;Need for lift  equipment PT Visit Diagnosis: Other abnormalities of gait and mobility (R26.89);Pain Pain - part of body: Hip;Leg    Time: 1418-1500 PT Time Calculation (min) (ACUTE ONLY): 42 min   Charges:   PT Evaluation $PT Eval Moderate Complexity: 1 Mod PT Treatments $Therapeutic Activity: 23-37 mins       Mabeline Caras, PT, DPT Acute Rehabilitation Services  Pager [phone] Office [place] 11/13/2018, 4:02 PM

## 2018-11-13 NOTE — ED Notes (Signed)
ED TO INPATIENT HANDOFF REPORT  ED Nurse Name and Phone #: will Ines Rebel [phone]  S Name/Age/Gender Donna [name] 68 y.o. female Room/Bed: TRACC/TRACC  Code Status   Code Status: Full Code  Home/SNF/Other Home Patient oriented to: self, place, time and situation Is this baseline? Yes   Triage Complete: Triage complete  Chief Complaint hyperglycemia  Triage Note The pt arrived by [facility] from home pt c/o generalized weakness for 6 weeks  More weakness for 3-4 days   Allergies Allergies  Allergen Reactions  . Bee Venom Hives    Takes benadryl  . Other Other (See Comments)    Raw foods with seeds give her "boils".  Avoids raw strawberries, blueberries. Tolerates cooked fruits, tomato sauce, bread/grains with seeds. [facility] 10/17/13 Berries with seeds Allergy to glutamine-C-quercet-selen-brom per [facility] 09/25/17  . Alteplase Rash  . Cefepime Rash  . Fentanyl Other (See Comments)    Hallucinations, syncope    . Metformin And Related Diarrhea    Level of Care/Admitting Diagnosis ED Disposition    ED Disposition Condition [facility] Area: [place] [100100]  Level of Care: Progressive [102]  I expect the patient will be discharged within 24 hours: No (not a candidate for 5C-Observation unit)  Covid Evaluation: Person Under Investigation (PUI)  Diagnosis: Hypokalemia [585929]  Admitting Physician: Shela Leff [[id number]]  Attending Physician: Shela Leff [[id number]]  PT Class (Do Not Modify): Observation [104]  PT Acc Code (Do Not Modify): Observation [10022]       B Medical/Surgery History Past Medical History:  Diagnosis Date  . CHF (congestive heart failure) ([place])   . Chronic /permanent atrial fibrillation ([place]) 2005   On Warfarin (follwed @ [facility]); Rate controlled - On BB  . Chronic diastolic heart failure, NYHA class 2 ([place])    Updated by A. fib, hypertensive heart disease and obesity; EDP  was only 7 by cardiac catheterization in September 2017  . Diabetic lumbosacral plexopathy ([place])   . Fibromyalgia   . Generalized anxiety disorder   . Hypokalemia 09/03/2017  . Incontinence   . Major depressive disorder   . OSA on CPAP   . Restless leg syndrome   . Syncope 09/03/2017  . Type II diabetes mellitus ([place])    Past Surgical History:  Procedure Laterality Date  . CARDIAC CATHETERIZATION  11/09/2015   [facility]: Nonocclusive CAD. EF 55%. EDP 7 mmHg.  Marland Kitchen CHOLECYSTECTOMY OPEN    . JOINT REPLACEMENT    . KNEE ARTHROSCOPY Right   . MASS EXCISION Left    sarcoma - shoulder  . SHOULDER OPEN ROTATOR CUFF REPAIR Bilateral   . TONSILLECTOMY    . TOTAL HIP ARTHROPLASTY Right 2015  . TOTAL HIP REVISION Right 2015  . TOTAL KNEE ARTHROPLASTY Right   . TRANSTHORACIC ECHOCARDIOGRAM  11/2013; 02/2016   a) [facility]: with Definity:  EF 60-65%. Mod LA dilation. Mild AoV sclerosis. Normal RHP. Indeterminate LV filling pressures. b) [facility]: Normal cavity size. EF 55-60%.no RWMA, mild AoV sclerosis (R cusp), Mod MAC. Mild biAtrial dilation.  . TRANSTHORACIC ECHOCARDIOGRAM  08/2017    Mild LVH with normal EF 55-60%.  Unable to fully assess wall motion, but appear to be normal.  Unable to determine diastolic function because of A. fib.  Mild to moderate MAC with mild mitral stenosis (mean gradient 9 mmHg).  Moderate LA dilation.  CVP estimated 3 mmHg.  No obvious evidence of pulmonary hypertension.  Marland Kitchen VAGINAL  HYSTERECTOMY       A IV Location/Drains/Wounds Patient Lines/Drains/Airways Status   Active Line/Drains/Airways    Name:   Placement date:   Placement time:   Site:   Days:   Peripheral IV 11/12/18 Left Wrist   11/12/18    1731    Wrist   1   Peripheral IV 11/12/18 Right Forearm   11/12/18    2349    Forearm   1   External Urinary Catheter   09/29/17    1500    -   410   Pressure Injury 09/29/17 Unstageable - Full thickness tissue loss in which the base of the ulcer is  covered by slough (yellow, tan, gray, green or brown) and/or eschar (tan, brown or black) in the wound bed.   09/29/17    1426     410          Intake/Output Last 24 hours  Intake/Output Summary (Last 24 hours) at 11/13/2018 0528 Last data filed at 11/13/2018 0524 Gross per 24 hour  Intake 374.81 ml  Output -  Net 374.81 ml    Labs/Imaging Results for orders placed or performed during the hospital encounter of 11/12/18 (from the past 48 hour(s))  Basic metabolic panel     Status: Abnormal   Collection Time: 11/12/18  5:39 PM  Result Value Ref Range   Sodium 136 135 - 145 mmol/L   Potassium <2.0 (LL) 3.5 - 5.1 mmol/L    Comment: REPEATED TO VERIFY CRITICAL RESULT CALLED TO, READ BACK BY AND VERIFIED WITH: C PRUITT,RN  1905 11/12/2018 WBOND    Chloride 87 (L) 98 - 111 mmol/L   CO2 30 22 - 32 mmol/L   Glucose, Bld 345 (H) 70 - 99 mg/dL   BUN 17 8 - 23 mg/dL   Creatinine, Ser 1.14 (H) 0.44 - 1.00 mg/dL   Calcium 9.7 8.9 - 10.3 mg/dL   GFR calc non Af Amer 50 (L) >60 mL/min   GFR calc Af Amer 58 (L) >60 mL/min   Anion gap 19 (H) 5 - 15    Comment: Performed at [facility], [address] [place], [place] [address]  CBC     Status: Abnormal   Collection Time: 11/12/18  5:39 PM  Result Value Ref Range   WBC 13.4 (H) 4.0 - 10.5 K/uL   RBC 5.86 (H) 3.87 - 5.11 MIL/uL   Hemoglobin 14.7 12.0 - 15.0 g/dL   HCT 47.6 (H) 36.0 - 46.0 %   MCV 81.2 80.0 - 100.0 fL   MCH 25.1 (L) 26.0 - 34.0 pg   MCHC 30.9 30.0 - 36.0 g/dL   RDW 17.7 (H) 11.5 - 15.5 %   Platelets 259 150 - 400 K/uL   nRBC 0.0 0.0 - 0.2 %    Comment: Performed at [facility] [address], [place], [place] [address]  Troponin I (High Sensitivity)     Status: Abnormal   Collection Time: 11/12/18  5:39 PM  Result Value Ref Range   Troponin I (High Sensitivity) 19 (H) <18 ng/L    Comment: (NOTE) Elevated high sensitivity troponin I (hsTnI) values and significant  changes across serial measurements  may suggest ACS but many other  chronic and acute conditions are known to elevate hsTnI results.  Refer to the Links section for chest pain algorithms and additional  guidance. Performed at [facility], [place] [address], [place], [place] [address]   Protime-INR  Status: Abnormal   Collection Time: 11/12/18  5:39 PM  Result Value Ref Range   Prothrombin Time 25.8 (H) 11.4 - 15.2 seconds   INR 2.4 (H) 0.8 - 1.2    Comment: (NOTE) INR goal varies based on device and disease states. Performed at [facility], [place] [address] [place], [place] [address]   Troponin I (High Sensitivity)     Status: Abnormal   Collection Time: 11/12/18  8:55 PM  Result Value Ref Range   Troponin I (High Sensitivity) 19 (H) <18 ng/L    Comment: (NOTE) Elevated high sensitivity troponin I (hsTnI) values and significant  changes across serial measurements may suggest ACS but many other  chronic and acute conditions are known to elevate hsTnI results.  Refer to the "Links" section for chest pain algorithms and additional  guidance. Performed at [facility], [place] [address] [place], [place] [address]   SARS Coronavirus 2 [facility] order, Performed in [facility] hospital lab) Nasopharyngeal Nasopharyngeal Swab     Status: None   Collection Time: 11/12/18 11:42 PM   Specimen: Nasopharyngeal Swab  Result Value Ref Range   SARS Coronavirus 2 NEGATIVE NEGATIVE    Comment: (NOTE) If result is NEGATIVE SARS-CoV-2 target nucleic acids are NOT DETECTED. The SARS-CoV-2 RNA is generally detectable in upper and lower  respiratory specimens during the acute phase of infection. The lowest  concentration of SARS-CoV-2 viral copies this assay can detect is 250  copies / mL. A negative result does not preclude SARS-CoV-2 infection  and should not be used as the sole basis for treatment or other  patient management decisions.  A negative result may occur with  improper specimen collection /  handling, submission of specimen other  than nasopharyngeal swab, presence of viral mutation(s) within the  areas targeted by this assay, and inadequate number of viral copies  (<250 copies / mL). A negative result must be combined with clinical  observations, patient history, and epidemiological information. If result is POSITIVE SARS-CoV-2 target nucleic acids are DETECTED. The SARS-CoV-2 RNA is generally detectable in upper and lower  respiratory specimens dur ing the acute phase of infection.  Positive  results are indicative of active infection with SARS-CoV-2.  Clinical  correlation with patient history and other diagnostic information is  necessary to determine patient infection status.  Positive results do  not rule out bacterial infection or co-infection with other viruses. If result is PRESUMPTIVE POSTIVE SARS-CoV-2 nucleic acids MAY BE PRESENT.   A presumptive positive result was obtained on the submitted specimen  and confirmed on repeat testing.  While 2019 novel coronavirus  (SARS-CoV-2) nucleic acids may be present in the submitted sample  additional confirmatory testing may be necessary for epidemiological  and / or clinical management purposes  to differentiate between  SARS-CoV-2 and other Sarbecovirus currently known to infect humans.  If clinically indicated additional testing with an alternate test  methodology [phone]) is advised. The SARS-CoV-2 RNA is generally  detectable in upper and lower respiratory sp ecimens during the acute  phase of infection. The expected result is Negative. Fact Sheet for Patients:  StrictlyIdeas.no Fact Sheet for Healthcare Providers: BankingDealers.co.za This test is not yet approved or cleared by the [place] FDA and has been authorized for detection and/or diagnosis of SARS-CoV-2 by FDA under an Emergency Use Authorization (EUA).  This EUA will remain in effect (meaning this  test can be used) for the duration of the COVID-19 declaration under Section  564(b)(1) of the Act, 21 U.S.C. section 360bbb-3(b)(1), unless the authorization is terminated or revoked sooner. Performed at [facility], [place] [address] [place], [place] [address]    Dg Chest 2 View  Result Date: 11/12/2018 CLINICAL DATA:  Weakness for 6 weeks. EXAM: CHEST - 2 VIEW COMPARISON:  09/27/2017 and prior radiographs FINDINGS: UPPER limits normal heart size and mild hilar prominence unchanged from remote studies. There is no evidence of focal airspace disease, pulmonary edema, suspicious pulmonary nodule/mass, pleural effusion, or pneumothorax. No acute bony abnormalities are identified. IMPRESSION: No active cardiopulmonary disease. Electronically Signed   By: Margarette Canada M.D.   On: 11/12/2018 18:49    Pending Labs Unresulted Labs (From admission, onward)    Start     Ordered   11/14/18 0500  Protime-INR  Daily,   R     11/13/18 0235   11/13/18 0500  Magnesium  Tomorrow morning,   R     11/13/18 0220   11/13/18 0500  CBC  Tomorrow morning,   R     11/13/18 0220   11/13/18 1540  Basic metabolic panel  Tomorrow morning,   R     11/13/18 0220   11/13/18 0220  Hemoglobin A1c  Once,   STAT    Comments: To assess prior glycemic control    11/13/18 0220   11/13/18 0220  Potassium  ONCE - STAT,   STAT     11/13/18 0220   11/13/18 0217  HIV antibody (Routine Testing)  Once,   STAT     11/13/18 0220   11/12/18 2342  Magnesium  Add-on,   AD     11/12/18 2341   11/12/18 2341  Urinalysis, Routine w reflex microscopic  ONCE - STAT,   STAT     11/12/18 2341          Vitals/Pain Today's Vitals   11/13/18 0430 11/13/18 0445 11/13/18 0500 11/13/18 0515  BP: 118/73 112/74 114/69 123/72  Pulse: (!) 103 95 (!) 112 (!) 105  Resp: _0 Temp:      TempSrc:      SpO2: 95% 94% 93% 93%  Weight:      Height:      PainSc:        Isolation Precautions No active  isolations  Medications Medications  sodium chloride flush (NS) 0.9 % injection 3 mL (3 mLs Intravenous Not Given 11/12/18 2346)  diltiazem (CARDIZEM) 1 mg/mL load via infusion 10 mg (10 mg Intravenous Bolus from Bag 11/13/18 0056)    And  diltiazem (CARDIZEM) 100 mg in dextrose 5% 119m (1 mg/mL) infusion (5 mg/hr Intravenous New Bag/Given 11/13/18 0056)  potassium chloride 10 mEq in 100 mL IVPB ( Intravenous Stopped 11/13/18 0449)  potassium chloride SA (K-DUR) CR tablet 40 mEq (has no administration in time range)  acetaminophen (TYLENOL) tablet 500 mg (has no administration in time range)  oxyCODONE (Oxy IR/ROXICODONE) immediate release tablet 5 mg (has no administration in time range)  ezetimibe (ZETIA) tablet 10 mg (has no administration in time range)  ALPRAZolam (XANAX) tablet 0.25 mg (has no administration in time range)  busPIRone (BUSPAR) tablet 10 mg (10 mg Oral Not Given 11/13/18 0316)  insulin detemir (LEVEMIR) injection 60 Units (has no administration in time range)  docusate sodium (COLACE) capsule 100 mg (has no administration in time range)  magnesium oxide (MAG-OX) tablet 400 mg (has no administration in time range)  ferrous sulfate tablet 325 mg (has no  administration in time range)  Melatonin CAPS 5 mg (has no administration in time range)  gabapentin (NEURONTIN) capsule 600 mg (has no administration in time range)  pramipexole (MIRAPEX) tablet 0.125 mg (has no administration in time range)  tiZANidine (ZANAFLEX) tablet 4 mg (has no administration in time range)  vitamin C (ASCORBIC ACID) tablet 500 mg (has no administration in time range)  insulin aspart (novoLOG) injection 0-9 Units (has no administration in time range)  Warfarin - Pharmacist Dosing Inpatient (has no administration in time range)  warfarin (COUMADIN) tablet 7.5 mg (has no administration in time range)    And  warfarin (COUMADIN) tablet 5 mg (has no administration in time range)  potassium chloride SA  (K-DUR) CR tablet 60 mEq (60 mEq Oral Given 11/12/18 2348)  magnesium sulfate IVPB 2 g 50 mL ( Intravenous Stopped 11/13/18 0007)  acetaminophen (TYLENOL) tablet 650 mg (650 mg Oral Given 11/13/18 0106)    Mobility non-ambulatory High fall risk   Focused Assessments    R Recommendations: See Admitting Provider Note  Report given to:   Additional Notes:

## 2018-11-13 NOTE — ED Notes (Signed)
Report given to floor nurse Tie

## 2018-11-14 LAB — POTASSIUM
Potassium: 3 mmol/L — ABNORMAL LOW (ref 3.5–5.1)
Potassium: 3.7 mmol/L (ref 3.5–5.1)

## 2018-11-14 LAB — GLUCOSE, CAPILLARY
Glucose-Capillary: 187 mg/dL — ABNORMAL HIGH (ref 70–99)
Glucose-Capillary: 191 mg/dL — ABNORMAL HIGH (ref 70–99)
Glucose-Capillary: 228 mg/dL — ABNORMAL HIGH (ref 70–99)
Glucose-Capillary: 304 mg/dL — ABNORMAL HIGH (ref 70–99)

## 2018-11-14 LAB — CBC
HCT: 35.4 % — ABNORMAL LOW (ref 36.0–46.0)
Hemoglobin: 11 g/dL — ABNORMAL LOW (ref 12.0–15.0)
MCH: 25.2 pg — ABNORMAL LOW (ref 26.0–34.0)
MCHC: 31.1 g/dL (ref 30.0–36.0)
MCV: 81.2 fL (ref 80.0–100.0)
Platelets: 162 10*3/uL (ref 150–400)
RBC: 4.36 MIL/uL (ref 3.87–5.11)
RDW: 17.2 % — ABNORMAL HIGH (ref 11.5–15.5)
WBC: 7.6 10*3/uL (ref 4.0–10.5)
nRBC: 0 % (ref 0.0–0.2)

## 2018-11-14 LAB — PROTIME-INR
INR: 2.2 — ABNORMAL HIGH (ref 0.8–1.2)
Prothrombin Time: 23.7 seconds — ABNORMAL HIGH (ref 11.4–15.2)

## 2018-11-14 LAB — BASIC METABOLIC PANEL
Anion gap: 11 (ref 5–15)
BUN: 17 mg/dL (ref 8–23)
CO2: 27 mmol/L (ref 22–32)
Calcium: 8.4 mg/dL — ABNORMAL LOW (ref 8.9–10.3)
Chloride: 97 mmol/L — ABNORMAL LOW (ref 98–111)
Creatinine, Ser: 0.91 mg/dL (ref 0.44–1.00)
GFR calc Af Amer: >60 mL/min (ref >60)
GFR calc non Af Amer: >60 mL/min (ref >60)
Glucose, Bld: 353 mg/dL — ABNORMAL HIGH (ref 70–99)
Potassium: 2.9 mmol/L — ABNORMAL LOW (ref 3.5–5.1)
Sodium: 135 mmol/L (ref 135–145)

## 2018-11-14 LAB — NA AND K (SODIUM & POTASSIUM), RAND UR
Potassium Urine: 37 mmol/L
Sodium, Ur: <10 mmol/L

## 2018-11-14 LAB — CREATININE, URINE, RANDOM: Creatinine, Urine: 80.83 mg/dL

## 2018-11-14 MED ORDER — POTASSIUM CHLORIDE CRYS ER 20 MEQ PO TBCR
40.0000 meq | EXTENDED_RELEASE_TABLET | Freq: Once | ORAL | Status: AC
  Administered 2018-11-14: 40 meq via ORAL
  Filled 2018-11-14: qty 2

## 2018-11-14 MED ORDER — SODIUM CHLORIDE 0.9 % IV BOLUS
250.0000 mL | Freq: Once | INTRAVENOUS | Status: AC
  Administered 2018-11-14: 250 mL via INTRAVENOUS

## 2018-11-14 MED ORDER — FUROSEMIDE 40 MG PO TABS
40.0000 mg | ORAL_TABLET | Freq: Every day | ORAL | Status: DC
  Administered 2018-11-14 – 2018-11-15 (×2): 40 mg via ORAL
  Filled 2018-11-14: qty 1
  Filled 2018-11-14: qty 2

## 2018-11-14 MED ORDER — POTASSIUM CHLORIDE CRYS ER 20 MEQ PO TBCR
40.0000 meq | EXTENDED_RELEASE_TABLET | ORAL | Status: AC
  Administered 2018-11-14 (×3): 40 meq via ORAL
  Filled 2018-11-14 (×3): qty 2

## 2018-11-14 MED ORDER — LACTATED RINGERS IV BOLUS
500.0000 mL | Freq: Once | INTRAVENOUS | Status: AC
  Administered 2018-11-14: 500 mL via INTRAVENOUS

## 2018-11-14 MED ORDER — METOPROLOL SUCCINATE ER 25 MG PO TB24
25.0000 mg | ORAL_TABLET | Freq: Every day | ORAL | Status: DC
  Administered 2018-11-14 – 2018-11-16 (×3): 25 mg via ORAL
  Filled 2018-11-14 (×3): qty 1

## 2018-11-14 NOTE — TOC Initial Note (Signed)
Transition of Care [facility]) - Initial/Assessment Note    Patient Details  Name: Donna [name] MRN: [id number] Date of Birth: [birth year]  Transition of Care [facility]) CM/SW Contact:    Wende Neighbors, LCSW Phone Number: 11/14/2018, 12:10 PM  Clinical Narrative:   CSW met patient at bedside to discuss discharge plan. Patient stated she is agreeable to go to rehab and has been before in the past. Patient stated she prefers [facility]. Patient stated she lives with her spouse at [facility] and he has been helping her with all of her ADLs. CSW to follow up with patient once bed offers are available.              Expected Discharge Plan: Skilled Nursing Facility Barriers to Discharge: Continued Medical Work up   Patient Goals and CMS Choice Patient states their goals for this hospitalization and ongoing recovery are:: to go home CMS Medicare.gov Compare Post Acute Care list provided to:: Patient Choice offered to / list presented to : Patient  Expected Discharge Plan and Services Expected Discharge Plan: [place] In-house Referral: Clinical Social Work     Living arrangements for the past 2 months: [place]                                      Prior Living Arrangements/Services Living arrangements for the past 2 months: Single Family Home Lives with:: Self, Spouse Patient language and need for interpreter reviewed:: Yes Do you feel safe going back to the place where you live?: Yes      Need for Family Participation in Patient Care: Yes (Comment) Care giver support system in place?: Yes (comment)   Criminal Activity/Legal Involvement Pertinent to Current Situation/Hospitalization: No - Comment as needed  Activities of Daily Living      Permission Sought/Granted Permission sought to share information with : Family Supports Permission granted to share information with : Yes, Verbal Permission Granted  Share Information with NAME: Donna [name]     Permission  granted to share info w Relationship: spouse  Permission granted to share info w Contact Information: [phone]  Emotional Assessment Appearance:: Appears stated age Attitude/Demeanor/Rapport: Engaged Affect (typically observed): Accepting Orientation: : Oriented to Situation, Oriented to  Time, Oriented to Self, Oriented to Place Alcohol / Substance Use: Not Applicable Psych Involvement: No (comment)  Admission diagnosis:  Hypokalemia [E87.6] Atrial fibrillation with RVR ([place]) [I48.91] Patient Active Problem List   Diagnosis Date Noted  . Leukocytosis 11/13/2018  . Hypomagnesemia 02/02/2018  . Preop cardiovascular exam 02/02/2018  . Decubitus ulcer of ischium, left, stage IV ([place]) 09/28/2017  . Cellulitis of sacral region 09/25/2017  . Normocytic anemia 09/25/2017  . Type 2 diabetes mellitus ([place]) 09/25/2017  . Chronic pain 09/25/2017  . Chronic atrial fibrillation with RVR   . History of seizures 09/03/2017  . Hypokalemia 09/03/2017  . Depression 02/09/2017  . Anxiety 02/09/2017  . Venous stasis of both lower extremities 07/25/2016  . Essential hypertension 07/25/2016  . Anticoagulant long-term use 07/15/2016  . Anatomical narrow angle borderline glaucoma of both eyes 04/02/2016  . Chronic diastolic heart failure ([place]) 02/08/2016  . Chronic atrial fibrillation ([place]): CHA2DS2-VASc Score 5; on Warfarin 02/08/2016  . Morbid obesity ([place]) 02/08/2016  . OSA (obstructive sleep apnea) 02/08/2016  . Pre-operative cardiovascular examination 02/08/2016  . Medication management 02/08/2016  . Fibromyalgia 08/07/2012  . Restless legs syndrome 08/07/2012  PCP:  Janine Limbo, PA-C Pharmacy:   [facility] [place], [place] [address] [place] [address] Phone: [phone] Fax: [phone]     Social Determinants of Health (SDOH) Interventions    Readmission Risk Interventions No  flowsheet data found.

## 2018-11-14 NOTE — Progress Notes (Signed)
ANTICOAGULATION CONSULT NOTE - Initial Consult  Pharmacy Consult for Coumadin Indication: atrial fibrillation  Allergies  Allergen Reactions  . Bee Venom Hives    Takes benadryl  . Other Other (See Comments)    Raw foods with seeds give her "boils".  Avoids raw strawberries, blueberries. Tolerates cooked fruits, tomato sauce, bread/grains with seeds. [facility] 10/17/13 Berries with seeds Allergy to glutamine-C-quercet-selen-brom per [facility] 09/25/17  . Alteplase Rash  . Cefepime Rash  . Fentanyl Other (See Comments)    Hallucinations, syncope    . Metformin And Related Diarrhea    Patient Measurements: Height: 5\' 11"  (180.3 cm) Weight: 260 lb 5.8 oz (118.1 kg) IBW/kg (Calculated) : 70.8  Vital Signs: Temp: 97.8 F (36.6 C) (09/13 1237) Temp Source: Oral (09/13 1237) BP: 95/67 (09/13 1237) Pulse Rate: 79 (09/13 1237)  Labs: Recent Labs    11/12/18 1739 11/12/18 2055 11/13/18 0459 11/14/18 0024 11/14/18 0221  HGB 14.7  --  12.5  --  11.0*  HCT 47.6*  --  40.3  --  35.4*  PLT 259  --  195  --  162  LABPROT 25.8*  --   --  23.7*  --   INR 2.4*  --   --  2.2*  --   CREATININE 1.14*  --  0.99 0.91  --   TROPONINIHS 19* 19*  --   --   --     Estimated Creatinine Clearance: 84.9 mL/min (by C-G formula based on SCr of 0.91 mg/dL).   Medical History: Past Medical History:  Diagnosis Date  . CHF (congestive heart failure) ([place])   . Chronic /permanent atrial fibrillation ([place]) 2005   On Warfarin (follwed @ [facility]); Rate controlled - On BB  . Chronic diastolic heart failure, NYHA class 2 ([place])    Updated by A. fib, hypertensive heart disease and obesity; EDP was only 7 by cardiac catheterization in September 2017  . Diabetic lumbosacral plexopathy ([place])   . Fibromyalgia   . Generalized anxiety disorder   . Hypokalemia 09/03/2017  . Incontinence   . Major depressive disorder   . OSA on CPAP   . Restless leg syndrome   . Syncope 09/03/2017  . Type  II diabetes mellitus (HCC)     Medications:  No current facility-administered medications on file prior to encounter.    Current Outpatient Medications on File Prior to Encounter  Medication Sig Dispense Refill  . acetaminophen (TYLENOL) 500 MG tablet Take 500 mg by mouth every 6 (six) hours as needed for mild pain.     Marland Kitchen ALPRAZolam (XANAX) 0.25 MG tablet Take 1 tablet (0.25 mg total) by mouth 3 (three) times daily. 10 tablet 0  . busPIRone (BUSPAR) 10 MG tablet Take 10 mg by mouth 2 (two) times daily.     . ciprofloxacin (CIPRO) 250 MG tablet Take 250 mg by mouth every morning.    . docusate sodium (COLACE) 100 MG capsule Take 100 mg by mouth daily as needed for mild constipation.    . Dulaglutide (TRULICITY) 1.5 0000000 SOPN Inject 1.5 mg into the skin every Saturday.     . ezetimibe (ZETIA) 10 MG tablet Take 10 mg by mouth at bedtime.     . ferrous sulfate 325 (65 FE) MG tablet Take 325 mg by mouth See admin instructions. Take one tablet (325 mg) by mouth on Sunday, Monday, Wednesday and Friday mornings.    . furosemide (LASIX) 40 MG tablet Take 40 mg by mouth daily.    Marland Kitchen  gabapentin (NEURONTIN) 600 MG tablet Take 600 mg by mouth 3 (three) times daily.    Marland Kitchen guaiFENesin (MUCINEX) 600 MG 12 hr tablet Take 600 mg by mouth 2 (two) times daily as needed for cough or to loosen phlegm.     . insulin detemir (LEVEMIR) 100 UNIT/ML injection Inject 0.4 mLs (40 Units total) into the skin at bedtime. (Patient taking differently: Inject 60 Units into the skin at bedtime. ) 10 mL 11  . insulin lispro (HUMALOG KWIKPEN) 100 UNIT/ML KiwkPen Inject 6-14 Units into the skin See admin instructions. Inject 6-14 units subcutaneously before each meal per sliding scale: CBG 201-250 6 units, 251-300 8 units, 301-350 10 units, 351-400 12 units, over 400 14 units and call MD    . lidocaine (LIDODERM) 5 % Place 1 patch onto the skin daily as needed (right hip pain). Remove & Discard patch within 12 hours or as directed  by MD     . magnesium oxide (MAG-OX) 400 MG tablet Take 1 tablet (400 mg total) by mouth 3 (three) times daily. ALSO TAKE A EXTRA 400 MG TABLET ON THE DAYS YOU TAKE ZAROXOLYN TABLET 300 tablet 0  . Melatonin 5 MG CAPS Take 5 mg by mouth at bedtime.    . metolazone (ZAROXOLYN) 10 MG tablet Take 10 mg by mouth once a week.     Marland Kitchen omeprazole (PRILOSEC) 20 MG capsule Take 20 mg by mouth daily.    . ondansetron (ZOFRAN) 4 MG tablet Take 1 tablet (4 mg total) by mouth every 6 (six) hours as needed for nausea. 20 tablet 0  . OxyCODONE HCl, Abuse Deter, (OXAYDO) 5 MG TABA Take 1 tablet by mouth 3 (three) times daily as needed (pain). (Patient taking differently: Take 1 tablet by mouth 3 (three) times daily as needed (moderate pain). ) 5 tablet 0  . potassium chloride SA (K-DUR,KLOR-CON) 20 MEQ tablet Take 5 tablets (100 mEq total) by mouth daily. TAKE AN EXTRA 20 MEQ ON THE DAYS YOU TAKE ZAROXLYN (Patient taking differently: Take 40 mEq by mouth 3 (three) times daily. ) 330 tablet 0  . pramipexole (MIRAPEX) 0.125 MG tablet Take 0.125 mg by mouth at bedtime.     . sertraline (ZOLOFT) 100 MG tablet Take 100 mg by mouth daily.    Marland Kitchen tiZANidine (ZANAFLEX) 4 MG tablet Take 4 mg by mouth 3 (three) times daily.     . VENLAFAXINE HCL ER PO Take 100 mg by mouth daily with breakfast.     . vitamin C (ASCORBIC ACID) 500 MG tablet Take 500 mg by mouth daily.    Marland Kitchen warfarin (COUMADIN) 7.5 MG tablet Take 5-7.5 mg by mouth daily. Take 7.5 mg on Sunday and Wednesday and 5 mg all other days    . amiodarone (PACERONE) 200 MG tablet Take 1 tablet (200 mg total) by mouth daily. (Patient not taking: Reported on 11/13/2018) 90 tablet 3  . spironolactone (ALDACTONE) 25 MG tablet Take 1 tablet (25 mg total) by mouth daily. (Patient not taking: Reported on 11/13/2018) 90 tablet 3     Assessment: 68 y.o. female admitted with weakness and hypokalemia, h/o Afib, to continue Coumadin  INR today is 2.2 (down from 2.4). Pt received 5 mg x  1 yesterday. PTA warfarin regimen of 5 mg daily, except 7.5 mg on Sun & Wed Hg/Hct decreased from 12.5 to 11. Plts wnls. No reports of bleeding   Goal of Therapy:  INR 2-3 Monitor platelets by anticoagulation protocol: Yes  Plan:  Give 7.5 mg x 1 today Daily INR Watch for signs/symptoms of bleeding  Sherren Kerns, PharmD PGY1 [place] Resident [phone] 11/14/2018,2:31 PM

## 2018-11-14 NOTE — Progress Notes (Signed)
PROGRESS NOTE    Donna [name]  [id number] DOB: Mar 20, 1950 DOA: 11/12/2018 PCP: Janine Limbo, PA-C   Brief Narrative:  Patient is a 68 year-old female with history of A. fib on Coumadin and amiodarone, chronic diastolic congestive heart failure, type diabetes mellitus, diabetic lumbosacral plexopathy, OSA on CPAP, restless leg syndrome who presented with generalized weakness.  She reported of falling frequently at home and most of the time sits on the chair.  She follows with her cardiologist Dr. Ellyn Hack but has not taken metoprolol she was previously taking.  She also takes Lasix, metalazone at home and recently stopped taking spironolactone.  On presentation she was found to be severely hypokalemic .EKG showed QT prolongation of 567.  She was in A. fib with RVR and the rate of 130s.  Patient was started on Cardizem drip and supplemental potassium. Currently heart rate is on controlled.  Start on Toprol.  Physical therapy evaluated her for frequent falls and recommended skilled nursing facility.  Social worker consulted.  Assessment & Plan:   Principal Problem:   Hypokalemia Active Problems:   Depression   Type 2 diabetes mellitus (HCC)   Chronic atrial fibrillation with RVR   Leukocytosis   Chronic A. fib with RVR: Follows with Dr. Ellyn Hack as an outpatient.  Not on rate controlling medicines due to blood pressure issues.  Initially started on Cardizem drip.  Started on low dose  Toprol.  Heart rate is currently well controlled.  Blood pressure soft but stable.  On warfarin for anticoagulation.  She was previously on amiodarone but not taking currently as it has been discontinued by the provider.  Severe hypokalemia: Has chronic hypokalemia.  Takes 120 meq of potassium every day.  On Lasix 40 mg daily, on metolazone 10 mg once a week at home for  diastolic heart failure. Continue potassium supplementation.  Magnesium level normal.  She was on a Spironolactone previously but currently  not taking.  Spironolactone added here.  Chronic diastolic CHF: Currently euvolemic. On Lasix 40 mg daily, on metolazone 10 mg once a week at home.  Continue current heart failure regimen.  Prolonged QTC: Improved to 489 today.  Avoid QT prolonging medications.  Anxiety/depression:Continue buspirone and xanax.Also on venlafaxine  Hyperlipidemia: Continue Zetia.  History of restless leg syndrome/tremors: On pramipexole.  Diabetes type 2: On insulin at home.  Continue current insulin regimen.  Hemoglobin A1c of 7.5.  Frequent falls: Patient says she falls very frequently at home.  She is a scared of fall so sits on the chair most of the time and does not ambulate.  She was seen by physical therapy and recommended skilled nursing facility.  Patient agrees with this plan.  Social worker consulted.         DVT prophylaxis: Warfarin Code Status: Full code Family Communication: Patient communicating with family.  Offered to call the family member but she says she does not prefer it. Disposition Plan: Skilled nursing facility as soon as the bed is available Stable for downgrading to telemetry   Consultants: None  Procedures: None  Antimicrobials:  Anti-infectives (From admission, onward)   None      Subjective:  Patient seen and examined the bedside this morning.  Currently heart rate well controlled.  Blood pressure stable.  She feels better and denies any complaints.  Slept well overnight.  She agrees with the idea of skilled nursing facility discharge.  I explained that our social worker will approach her and give her list of options and  we also need insurance authorization.  Objective: Vitals:   11/14/18 0400 11/14/18 0430 11/14/18 0700 11/14/18 1010  BP: 94/60 (!) 102/47 (!) 147/122 (!) 109/57  Pulse: 72 75 82 87  Resp: (!) 26 20 (!) 32   Temp:  98.1 F (36.7 C) 98 F (36.7 C)   TempSrc:  Oral Oral   SpO2: 97% 96% 95%   Weight:      Height:        Intake/Output  Summary (Last 24 hours) at 11/14/2018 1131 Last data filed at 11/14/2018 1053 Gross per 24 hour  Intake 1696.53 ml  Output 1650 ml  Net 46.53 ml   Filed Weights   11/12/18 1727 11/13/18 0614  Weight: 117.9 kg 118.1 kg    Examination:  General exam: Appears calm and comfortable ,Not in distress,obese HEENT:PERRL,Oral mucosa moist, Ear/Nose normal on gross exam Respiratory system: Bilateral equal air entry, normal vesicular breath sounds, no wheezes or crackles  Cardiovascular system:afib , No JVD, murmurs, rubs, gallops or clicks. No pedal edema. Gastrointestinal system: Abdomen is nondistended, soft and nontender. No organomegaly or masses felt. Normal bowel sounds heard. Central nervous system: Alert and oriented. No focal neurological deficits. Extremities: No edema, no clubbing ,no cyanosis, distal peripheral pulses palpable. Skin: No rashes, lesions or ulcers,no icterus ,no pallor     Data Reviewed: I have personally reviewed following labs and imaging studies  CBC: Recent Labs  Lab 11/12/18 1739 11/13/18 0459 11/14/18 0221  WBC 13.4* 11.6* 7.6  HGB 14.7 12.5 11.0*  HCT 47.6* 40.3 35.4*  MCV 81.2 80.9 81.2  PLT 259 195 0000000   Basic Metabolic Panel: Recent Labs  Lab 11/12/18 1739 11/13/18 0459 11/13/18 1327 11/13/18 2329 11/14/18 0024  NA 136 134*  --   --  135  K <2.0* 2.2* 2.5* 3.0* 2.9*  CL 87* 93*  --   --  97*  CO2 30 27  --   --  27  GLUCOSE 345* 314*  --   --  353*  BUN 17 20  --   --  17  CREATININE 1.14* 0.99  --   --  0.91  CALCIUM 9.7 8.6*  --   --  8.4*  MG  --  2.1  --   --   --    GFR: Estimated Creatinine Clearance: 84.9 mL/min (by C-G formula based on SCr of 0.91 mg/dL). Liver Function Tests: No results for input(s): AST, ALT, ALKPHOS, BILITOT, PROT, ALBUMIN in the last 168 hours. No results for input(s): LIPASE, AMYLASE in the last 168 hours. No results for input(s): AMMONIA in the last 168 hours. Coagulation Profile: Recent Labs  Lab  11/12/18 1739 11/14/18 0024  INR 2.4* 2.2*   Cardiac Enzymes: No results for input(s): CKTOTAL, CKMB, CKMBINDEX, TROPONINI in the last 168 hours. BNP (last 3 results) No results for input(s): PROBNP in the last 8760 hours. HbA1C: Recent Labs    11/13/18 0459  HGBA1C 7.5*   CBG: Recent Labs  Lab 11/13/18 1139 11/13/18 1555 11/13/18 2119 11/14/18 0622  GLUCAP 347* 339* 338* 187*   Lipid Profile: No results for input(s): CHOL, HDL, LDLCALC, TRIG, CHOLHDL, LDLDIRECT in the last 72 hours. Thyroid Function Tests: No results for input(s): TSH, T4TOTAL, FREET4, T3FREE, THYROIDAB in the last 72 hours. Anemia Panel: No results for input(s): VITAMINB12, FOLATE, FERRITIN, TIBC, IRON, RETICCTPCT in the last 72 hours. Sepsis Labs: No results for input(s): PROCALCITON, LATICACIDVEN in the last 168 hours.  Recent Results (from the past  240 hour(s))  SARS Coronavirus 2 [facility] order, Performed in [facility] hospital lab) Nasopharyngeal Nasopharyngeal Swab     Status: None   Collection Time: 11/12/18 11:42 PM   Specimen: Nasopharyngeal Swab  Result Value Ref Range Status   SARS Coronavirus 2 NEGATIVE NEGATIVE Final    Comment: (NOTE) If result is NEGATIVE SARS-CoV-2 target nucleic acids are NOT DETECTED. The SARS-CoV-2 RNA is generally detectable in upper and lower  respiratory specimens during the acute phase of infection. The lowest  concentration of SARS-CoV-2 viral copies this assay can detect is 250  copies / mL. A negative result does not preclude SARS-CoV-2 infection  and should not be used as the sole basis for treatment or other  patient management decisions.  A negative result may occur with  improper specimen collection / handling, submission of specimen other  than nasopharyngeal swab, presence of viral mutation(s) within the  areas targeted by this assay, and inadequate number of viral copies  (<250 copies / mL). A negative result must be combined with clinical    observations, patient history, and epidemiological information. If result is POSITIVE SARS-CoV-2 target nucleic acids are DETECTED. The SARS-CoV-2 RNA is generally detectable in upper and lower  respiratory specimens dur ing the acute phase of infection.  Positive  results are indicative of active infection with SARS-CoV-2.  Clinical  correlation with patient history and other diagnostic information is  necessary to determine patient infection status.  Positive results do  not rule out bacterial infection or co-infection with other viruses. If result is PRESUMPTIVE POSTIVE SARS-CoV-2 nucleic acids MAY BE PRESENT.   A presumptive positive result was obtained on the submitted specimen  and confirmed on repeat testing.  While 2019 novel coronavirus  (SARS-CoV-2) nucleic acids may be present in the submitted sample  additional confirmatory testing may be necessary for epidemiological  and / or clinical management purposes  to differentiate between  SARS-CoV-2 and other Sarbecovirus currently known to infect humans.  If clinically indicated additional testing with an alternate test  methodology [phone]) is advised. The SARS-CoV-2 RNA is generally  detectable in upper and lower respiratory sp ecimens during the acute  phase of infection. The expected result is Negative. Fact Sheet for Patients:  StrictlyIdeas.no Fact Sheet for Healthcare Providers: BankingDealers.co.za This test is not yet approved or cleared by the [place] FDA and has been authorized for detection and/or diagnosis of SARS-CoV-2 by FDA under an Emergency Use Authorization (EUA).  This EUA will remain in effect (meaning this test can be used) for the duration of the COVID-19 declaration under Section 564(b)(1) of the Act, 21 U.S.C. section 360bbb-3(b)(1), unless the authorization is terminated or revoked sooner. Performed at [facility], [place] [address],  [place], [place] [address]          Radiology Studies: Dg Chest 2 View  Result Date: 11/12/2018 CLINICAL DATA:  Weakness for 6 weeks. EXAM: CHEST - 2 VIEW COMPARISON:  09/27/2017 and prior radiographs FINDINGS: UPPER limits normal heart size and mild hilar prominence unchanged from remote studies. There is no evidence of focal airspace disease, pulmonary edema, suspicious pulmonary nodule/mass, pleural effusion, or pneumothorax. No acute bony abnormalities are identified. IMPRESSION: No active cardiopulmonary disease. Electronically Signed   By: Margarette Canada M.D.   On: 11/12/2018 18:49        Scheduled Meds:  ALPRAZolam  0.25 mg Oral TID   busPIRone  10 mg Oral BID   ezetimibe  10 mg Oral QHS  ferrous sulfate  325 mg Oral Once per day on Sun Mon Wed Fri   gabapentin  600 mg Oral TID   insulin aspart  0-9 Units Subcutaneous TID WC   insulin detemir  60 Units Subcutaneous QHS   magnesium oxide  400 mg Oral TID   Melatonin  3 mg Oral QHS   metoprolol succinate  25 mg Oral Daily   potassium chloride  40 mEq Oral Q3H   pramipexole  0.125 mg Oral QHS   sodium chloride flush  3 mL Intravenous Once   spironolactone  25 mg Oral Daily   tiZANidine  4 mg Oral TID   vitamin C  500 mg Oral Daily   warfarin  7.5 mg Oral Once per day on Sun Wed   And   warfarin  5 mg Oral Once per day on Mon Tue Thu Fri Sat   Warfarin - Pharmacist Dosing Inpatient   Does not apply q1800   Continuous Infusions:   LOS: 1 day    Time spent: 35 mins.More than 50% of that time was spent in counseling and/or coordination of care.      Shelly Coss, MD [facility] Pager [phone]  If 7PM-7AM, please contact night-coverage [email or web address] Password [facility] 11/14/2018, 11:31 AM

## 2018-11-14 NOTE — NC FL2 (Addendum)
[place] LEVEL OF CARE SCREENING TOOL     IDENTIFICATION  Patient Name: Donna [name] Birthdate: 09-22-1950 Sex: female Admission Date (Current Location): 11/12/2018  [facility] and [place] Number:  Herbalist and Address:  The . [facility], [place] [address], [place], [place] [address]      Provider Number: [id number]  Attending Physician Name and Address:  Shelly Coss, MD  Relative Name and Phone Number:  Broadus John [phone]    Current Level of Care: Hospital Recommended Level of Care: [place] Prior Approval Number:    Date Approved/Denied:   PASRR Number: pending  Discharge Plan: SNF    Current Diagnoses: Patient Active Problem List   Diagnosis Date Noted  . Leukocytosis 11/13/2018  . Hypomagnesemia 02/02/2018  . Preop cardiovascular exam 02/02/2018  . Decubitus ulcer of ischium, left, stage IV ([place]) 09/28/2017  . Cellulitis of sacral region 09/25/2017  . Normocytic anemia 09/25/2017  . Type 2 diabetes mellitus ([place]) 09/25/2017  . Chronic pain 09/25/2017  . Chronic atrial fibrillation with RVR   . History of seizures 09/03/2017  . Hypokalemia 09/03/2017  . Depression 02/09/2017  . Anxiety 02/09/2017  . Venous stasis of both lower extremities 07/25/2016  . Essential hypertension 07/25/2016  . Anticoagulant long-term use 07/15/2016  . Anatomical narrow angle borderline glaucoma of both eyes 04/02/2016  . Chronic diastolic heart failure ([place]) 02/08/2016  . Chronic atrial fibrillation ([place]): CHA2DS2-VASc Score 5; on Warfarin 02/08/2016  . Morbid obesity ([place]) 02/08/2016  . OSA (obstructive sleep apnea) 02/08/2016  . Pre-operative cardiovascular examination 02/08/2016  . Medication management 02/08/2016  . Fibromyalgia 08/07/2012  . Restless legs syndrome 08/07/2012    Orientation RESPIRATION BLADDER Height & Weight     Time, Self, Situation, Place  Normal Continent Weight: 260 lb 5.8 oz (118.1  kg) Height:  5\' 11"  (180.3 cm)  BEHAVIORAL SYMPTOMS/MOOD NEUROLOGICAL BOWEL NUTRITION STATUS      Continent Diet(carb modified)  AMBULATORY STATUS COMMUNICATION OF NEEDS Skin   Extensive Assist Verbally Normal                       Personal Care Assistance Level of Assistance  Bathing, Feeding, Dressing Bathing Assistance: Limited assistance Feeding assistance: Independent Dressing Assistance: Limited assistance     Functional Limitations Info  Sight, Hearing, Speech Sight Info: Adequate Hearing Info: Adequate Speech Info: Adequate    SPECIAL CARE FACTORS FREQUENCY  PT (By licensed PT), OT (By licensed OT)     PT Frequency: 5x wk OT Frequency: 5x wk            Contractures Contractures Info: Not present    Additional Factors Info  Code Status, Allergies Code Status Info: full code Allergies Info: Bee Venom Other Alteplase Cefepime Fentanyl Metformin And Related           Current Medications (11/14/2018):  This is the current hospital active medication list Current Facility-Administered Medications  Medication Dose Route Frequency Provider Last Rate Last Dose  . acetaminophen (TYLENOL) tablet 500 mg  500 mg Oral Q6H PRN Shela Leff, MD      . ALPRAZolam Duanne Moron) tablet 0.25 mg  0.25 mg Oral TID Shela Leff, MD   0.25 mg at 11/14/18 1012  . busPIRone (BUSPAR) tablet 10 mg  10 mg Oral BID Shela Leff, MD   10 mg at 11/14/18 1011  . docusate sodium (COLACE) capsule 100 mg  100 mg Oral Daily PRN Shela Leff, MD  100 mg at 11/14/18 1024  . ezetimibe (ZETIA) tablet 10 mg  10 mg Oral QHS Shela Leff, MD   10 mg at 11/13/18 2130  . ferrous sulfate tablet 325 mg  325 mg Oral Once per day on Sun Mon Wed Fri Shela Leff, MD      . furosemide (LASIX) tablet 40 mg  40 mg Oral Daily Adhikari, Amrit, MD      . gabapentin (NEURONTIN) capsule 600 mg  600 mg Oral TID Shela Leff, MD   600 mg at 11/14/18 1010  . insulin aspart  (novoLOG) injection 0-9 Units  0-9 Units Subcutaneous TID WC Shela Leff, MD   2 Units at 11/14/18 [phone]  . insulin detemir (LEVEMIR) injection 60 Units  60 Units Subcutaneous QHS Shela Leff, MD   60 Units at 11/13/18 2127  . magnesium oxide (MAG-OX) tablet 400 mg  400 mg Oral TID Shela Leff, MD   400 mg at 11/14/18 1011  . Melatonin TABS 3 mg  3 mg Oral QHS Shela Leff, MD   3 mg at 11/13/18 2129  . metoprolol succinate (TOPROL-XL) 24 hr tablet 25 mg  25 mg Oral Daily Shelly Coss, MD   25 mg at 11/14/18 1012  . oxyCODONE (Oxy IR/ROXICODONE) immediate release tablet 5 mg  5 mg Oral TID PRN Shela Leff, MD   5 mg at 11/14/18 1024  . potassium chloride SA (K-DUR) CR tablet 40 mEq  40 mEq Oral Q3H Adhikari, Amrit, MD   40 mEq at 11/14/18 1011  . pramipexole (MIRAPEX) tablet 0.125 mg  0.125 mg Oral QHS Shela Leff, MD   0.125 mg at 11/13/18 2129  . sodium chloride flush (NS) 0.9 % injection 3 mL  3 mL Intravenous Once Shela Leff, MD      . spironolactone (ALDACTONE) tablet 25 mg  25 mg Oral Daily Shelly Coss, MD   25 mg at 11/14/18 1012  . tiZANidine (ZANAFLEX) tablet 4 mg  4 mg Oral TID Shela Leff, MD   4 mg at 11/14/18 1012  . vitamin C (ASCORBIC ACID) tablet 500 mg  500 mg Oral Daily Shela Leff, MD   500 mg at 11/14/18 1012  . warfarin (COUMADIN) tablet 7.5 mg  7.5 mg Oral Once per day on Sun Wed Rathore, Vasundhra, MD       And  . warfarin (COUMADIN) tablet 5 mg  5 mg Oral Once per day on Mon Tue Thu Fri Sat Shela Leff, MD   5 mg at 11/13/18 1642  . Warfarin - Pharmacist Dosing Inpatient   Does not apply [id number] Shela Leff, MD         Discharge Medications: Please see discharge summary for a list of discharge medications.  Relevant Imaging Results:  Relevant Lab Results:   Additional Information SSN [id number]  Thurmond Butts, [place]

## 2018-11-14 NOTE — Consult Note (Signed)
[facility]    NEPHROLOGY CONSULTATION NOTE  PATIENT ID:  Donna [name], DOB:  12-04-1950  HPI: The patient is a 68 y.o. year old female patient with a past medical history significant for atrial fibrillation on Coumadin and amiodarone, chronic diastolic heart failure, type 2 diabetes, diabetic lumbosacral plexopathy, obstructive sleep apnea on CPAP, restless leg syndrome who presented with generalized weakness.  She apparently fell at home.  She was noted to be severely hypokalemic.  She reports episodes of intermittent hyperkalemia and profound weakness.  She was found to be in A. fib with RVR with a heart rate in the 130s.  She was started on a Cardizem drip and supplemental potassium.  She has continued to have potassium supplementation throughout her hospitalization.  She reports a long history of being on furosemide and metolazone.  She had recently stopped taking spironolactone.  Renal consultation has been called for hyperkalemia.  She denies any alcohol history.   Past Medical History:  Diagnosis Date  . CHF (congestive heart failure) ([place])   . Chronic /permanent atrial fibrillation ([place]) 2005   On Warfarin (follwed @ [facility]); Rate controlled - On BB  . Chronic diastolic heart failure, NYHA class 2 ([place])    Updated by A. fib, hypertensive heart disease and obesity; EDP was only 7 by cardiac catheterization in September 2017  . Diabetic lumbosacral plexopathy ([place])   . Fibromyalgia   . Generalized anxiety disorder   . Hypokalemia 09/03/2017  . Incontinence   . Major depressive disorder   . OSA on CPAP   . Restless leg syndrome   . Syncope 09/03/2017  . Type II diabetes mellitus ([place])     Past Surgical History:  Procedure Laterality Date  . CARDIAC CATHETERIZATION  11/09/2015   [facility]: Nonocclusive CAD. EF 55%. EDP 7 mmHg.  Marland Kitchen CHOLECYSTECTOMY OPEN    . JOINT REPLACEMENT    . KNEE ARTHROSCOPY Right   . MASS EXCISION Left     sarcoma - shoulder  . SHOULDER OPEN ROTATOR CUFF REPAIR Bilateral   . TONSILLECTOMY    . TOTAL HIP ARTHROPLASTY Right 2015  . TOTAL HIP REVISION Right 2015  . TOTAL KNEE ARTHROPLASTY Right   . TRANSTHORACIC ECHOCARDIOGRAM  11/2013; 02/2016   a) [facility]: with Definity:  EF 60-65%. Mod LA dilation. Mild AoV sclerosis. Normal RHP. Indeterminate LV filling pressures. b) [facility]: Normal cavity size. EF 55-60%.no RWMA, mild AoV sclerosis (R cusp), Mod MAC. Mild biAtrial dilation.  . TRANSTHORACIC ECHOCARDIOGRAM  08/2017    Mild LVH with normal EF 55-60%.  Unable to fully assess wall motion, but appear to be normal.  Unable to determine diastolic function because of A. fib.  Mild to moderate MAC with mild mitral stenosis (mean gradient 9 mmHg).  Moderate LA dilation.  CVP estimated 3 mmHg.  No obvious evidence of pulmonary hypertension.  Marland Kitchen VAGINAL HYSTERECTOMY      Family History  Problem Relation Age of Onset  . Hypertension Mother   . Heart disease Mother   . Leukemia Son     Social History   Tobacco Use  . Smoking status: Never Smoker  . Smokeless tobacco: Never Used  Substance Use Topics  . Alcohol use: No  . Drug use: No    REVIEW OF SYSTEMS: General:  no fatigue, positive weakness Head:  no headaches Eyes:  no blurred vision ENT:  no sore throat Neck:  no masses CV:  no chest pain, no  orthopnea Lungs:  no shortness of breath, no cough GI:  no nausea or vomiting, no diarrhea GU:  no dysuria or hematuria Skin:  no rashes or lesions Neuro:  no focal numbness or weakness Psych:  no depression or anxiety    PHYSICAL EXAM:  Vitals:   11/14/18 1010 11/14/18 1237  BP: (!) 109/57 95/67  Pulse: 87 79  Resp:  13  Temp:  97.8 F (36.6 C)  SpO2:  98%   I/O last 3 completed shifts: In: 2211.5 [P.O.:890; I.V.:67; IV Piggyback:1254.5] Out: 0076 [Urine:1650]   General:  AAOx3 NAD HEENT: MMM  AT anicteric sclera Neck:  No JVD, no adenopathy CV:  Heart RRR   Lungs:  L/S CTA bilaterally Abd:  abd SNT/ND with normal BS GU:  Bladder non-palpable Extremities: Trace bilateral lower extremity edema Skin:  No skin rash Psych:  normal mood and affect Neuro:  no focal deficits   CURRENT MEDICATIONS:  . ALPRAZolam  0.25 mg Oral TID  . busPIRone  10 mg Oral BID  . ezetimibe  10 mg Oral QHS  . ferrous sulfate  325 mg Oral Once per day on Sun Mon Wed Fri  . furosemide  40 mg Oral Daily  . gabapentin  600 mg Oral TID  . insulin aspart  0-9 Units Subcutaneous TID WC  . insulin detemir  60 Units Subcutaneous QHS  . magnesium oxide  400 mg Oral TID  . Melatonin  3 mg Oral QHS  . metoprolol succinate  25 mg Oral Daily  . pramipexole  0.125 mg Oral QHS  . sodium chloride flush  3 mL Intravenous Once  . spironolactone  25 mg Oral Daily  . tiZANidine  4 mg Oral TID  . vitamin C  500 mg Oral Daily  . warfarin  7.5 mg Oral Once per day on Sun Wed   And  . warfarin  5 mg Oral Once per day on Mon Tue Thu Fri Sat  . Warfarin - Pharmacist Dosing Inpatient   Does not apply q1800     HOME MEDICATIONS:  Prior to Admission medications   Medication Sig Start Date End Date Taking? Authorizing Provider  acetaminophen (TYLENOL) 500 MG tablet Take 500 mg by mouth every 6 (six) hours as needed for mild pain.    Yes [provider]  ALPRAZolam (XANAX) 0.25 MG tablet Take 1 tablet (0.25 mg total) by mouth 3 (three) times daily. 09/07/17  Yes Sheikh, Omair Latif, DO  busPIRone (BUSPAR) 10 MG tablet Take 10 mg by mouth 2 (two) times daily.    Yes [provider]  ciprofloxacin (CIPRO) 250 MG tablet Take 250 mg by mouth every morning.   Yes [provider]  docusate sodium (COLACE) 100 MG capsule Take 100 mg by mouth daily as needed for mild constipation.   Yes [provider]  Dulaglutide (TRULICITY) 1.5 AU/6.3FH SOPN Inject 1.5 mg into the skin every Saturday.    Yes [provider]  ezetimibe (ZETIA) 10 MG tablet Take 10  mg by mouth at bedtime.    Yes [provider]  ferrous sulfate 325 (65 FE) MG tablet Take 325 mg by mouth See admin instructions. Take one tablet (325 mg) by mouth on Sunday, Monday, Wednesday and Friday mornings.   Yes [provider]  furosemide (LASIX) 40 MG tablet Take 40 mg by mouth daily.   Yes [provider]  gabapentin (NEURONTIN) 600 MG tablet Take 600 mg by mouth 3 (three) times daily.  Yes [provider]  guaiFENesin (MUCINEX) 600 MG 12 hr tablet Take 600 mg by mouth 2 (two) times daily as needed for cough or to loosen phlegm.    Yes [provider]  insulin detemir (LEVEMIR) 100 UNIT/ML injection Inject 0.4 mLs (40 Units total) into the skin at bedtime. Patient taking differently: Inject 60 Units into the skin at bedtime.  09/30/17  Yes Georgette Shell, MD  insulin lispro (HUMALOG KWIKPEN) 100 UNIT/ML KiwkPen Inject 6-14 Units into the skin See admin instructions. Inject 6-14 units subcutaneously before each meal per sliding scale: CBG 201-250 6 units, 251-300 8 units, 301-350 10 units, 351-400 12 units, over 400 14 units and call MD   Yes [provider]  lidocaine (LIDODERM) 5 % Place 1 patch onto the skin daily as needed (right hip pain). Remove & Discard patch within 12 hours or as directed by MD    Yes [provider]  magnesium oxide (MAG-OX) 400 MG tablet Take 1 tablet (400 mg total) by mouth 3 (three) times daily. ALSO TAKE A EXTRA 400 MG TABLET ON THE DAYS YOU TAKE ZAROXOLYN TABLET 02/02/18  Yes Leonie Man, MD  Melatonin 5 MG CAPS Take 5 mg by mouth at bedtime.   Yes [provider]  metolazone (ZAROXOLYN) 10 MG tablet Take 10 mg by mouth once a week.    Yes [provider]  omeprazole (PRILOSEC) 20 MG capsule Take 20 mg by mouth daily.   Yes [provider]  ondansetron (ZOFRAN) 4 MG tablet Take 1 tablet (4 mg total) by mouth every 6 (six) hours as needed for nausea. 09/30/17  Yes  Georgette Shell, MD  OxyCODONE HCl, Abuse Deter, (OXAYDO) 5 MG TABA Take 1 tablet by mouth 3 (three) times daily as needed (pain). Patient taking differently: Take 1 tablet by mouth 3 (three) times daily as needed (moderate pain).  09/07/17  Yes Sheikh, Omair Latif, DO  potassium chloride SA (K-DUR,KLOR-CON) 20 MEQ tablet Take 5 tablets (100 mEq total) by mouth daily. TAKE AN EXTRA 20 MEQ ON THE DAYS YOU TAKE ZAROXLYN Patient taking differently: Take 40 mEq by mouth 3 (three) times daily.  02/02/18  Yes Leonie Man, MD  pramipexole (MIRAPEX) 0.125 MG tablet Take 0.125 mg by mouth at bedtime.    Yes [provider]  sertraline (ZOLOFT) 100 MG tablet Take 100 mg by mouth daily.   Yes [provider]  tiZANidine (ZANAFLEX) 4 MG tablet Take 4 mg by mouth 3 (three) times daily.    Yes [provider]  VENLAFAXINE HCL ER PO Take 100 mg by mouth daily with breakfast.    Yes [provider]  vitamin C (ASCORBIC ACID) 500 MG tablet Take 500 mg by mouth daily.   Yes [provider]  warfarin (COUMADIN) 7.5 MG tablet Take 5-7.5 mg by mouth daily. Take 7.5 mg on Sunday and Wednesday and 5 mg all other days   Yes [provider]  amiodarone (PACERONE) 200 MG tablet Take 1 tablet (200 mg total) by mouth daily. Patient not taking: Reported on 11/13/2018 07/03/18   Leonie Man, MD  spironolactone (ALDACTONE) 25 MG tablet Take 1 tablet (25 mg total) by mouth daily. Patient not taking: Reported on 11/13/2018 05/12/18 08/10/18  Leonie Man, MD       LABS:  CBC Latest Ref Rng & Units 11/14/2018 11/13/2018 11/12/2018  WBC 4.0 - 10.5 K/uL 7.6 11.6(H) 13.4(H)  Hemoglobin 12.0 - 15.0  g/dL 11.0(L) 12.5 14.7  Hematocrit 36.0 - 46.0 % 35.4(L) 40.3 47.6(H)  Platelets 150 - 400 K/uL 162 195 259    CMP Latest Ref Rng & Units 11/14/2018 11/14/2018 11/13/2018  Glucose 70 - 99 mg/dL - 353(H) -  BUN 8 - 23 mg/dL - 17 -  Creatinine 0.44 - 1.00 mg/dL - 0.91 -   Sodium 135 - 145 mmol/L - 135 -  Potassium 3.5 - 5.1 mmol/L 3.7 2.9(L) 3.0(L)  Chloride 98 - 111 mmol/L - 97(L) -  CO2 22 - 32 mmol/L - 27 -  Calcium 8.9 - 10.3 mg/dL - 8.4(L) -  Total Protein 6.0 - 8.5 g/dL - - -  Total Bilirubin 0.0 - 1.2 mg/dL - - -  Alkaline Phos 39 - 117 IU/L - - -  AST 0 - 40 IU/L - - -  ALT 0 - 32 IU/L - - -    Lab Results  Component Value Date   CALCIUM 8.4 (L) 11/14/2018   PHOS 3.7 09/07/2017       Component Value Date/Time   COLORURINE YELLOW 11/13/2018 [place] 11/13/2018 1244   LABSPEC 1.013 11/13/2018 1244   PHURINE 6.0 11/13/2018 [place] 11/13/2018 [place] 11/13/2018 [place] 11/13/2018 [place] 11/13/2018 1244   PROTEINUR NEGATIVE 11/13/2018 1244   UROBILINOGEN 1.0 09/06/2010 1130   NITRITE NEGATIVE 11/13/2018 1244   LEUKOCYTESUR NEGATIVE 11/13/2018 1244   No results found for: PHART, PCO2ART, PO2ART, HCO3, TCO2, ACIDBASEDEF, O2SAT     Component Value Date/Time   IRON 24 (L) 09/06/2017 0502   TIBC 214 (L) 09/06/2017 0502   FERRITIN 316 (H) 09/06/2017 0502   IRONPCTSAT 11 09/06/2017 0502       ASSESSMENT/PLAN:     1.  Severe hypokalemia.  This is been recurrent.  She takes 120 mEq of potassium daily.  She is on Lasix and metolazone at home for diastolic heart failure.  Her magnesium level has been normal.  Given her markedly low potassium, she would have at least a 300 mEq potassium deficit.  Will check a TSH.  No signs or symptoms of an insulinoma.  Will check urine electrolytes to determine if this is renal losses of potassium versus malabsorption.  Denies any GI losses.  Magnesium level is fine.  Ultimately suspect this is related to longstanding use of loop diuretics.  Would use potassium sparing diuretics.  Given normal CF potassium levels prior to the use of significant diuretics, no evidence of a genetic syndrome.  2.  Chronic diastolic congestive  heart failure.  As above, will continue furosemide but add spironolactone.  3.  Atrial fibrillation with RVR.  Heart rate improved.  4.  Type 2 diabetes.  Continue current management.   [place], DO, [place]

## 2018-11-14 NOTE — Progress Notes (Signed)
Patient does not use CPAP at home.  Has been refusing CPAP.  No distress noted.

## 2018-11-14 NOTE — Progress Notes (Signed)
Pharmacist verified 6 dose of KCL 10 mEq were completed this evening, later than order expected due to Pt complained having IV irritation and pain. She was able to tolerate at 20-30 ml / hr instead of 100 ml/hr per [facility] recommendation. Assessed IV site , no extravasation, no phlebitis noted. Repeated K level was 3.0 at 23:29.   BP 70/42 - 88/65 mmHg after schedule bedtime medications given. HR 70s, Pt's quite sleepy, arouseable and awake, oriented x 4. SPO2 93 at room air. Denied chest pain.    Notified NP X. Blount, on- call provider to night, order received for LR 500 ml bolus and KCL 40 mEq PO.   After LR 500 ml bolus, BP 77/53 mmHg, no improvement. On-call provider, NP X. Blount made aware.  We will continue to monitor.  Kennyth Lose, RN

## 2018-11-15 DIAGNOSIS — I5032 Chronic diastolic (congestive) heart failure: Secondary | ICD-10-CM

## 2018-11-15 DIAGNOSIS — I951 Orthostatic hypotension: Secondary | ICD-10-CM

## 2018-11-15 DIAGNOSIS — I482 Chronic atrial fibrillation, unspecified: Secondary | ICD-10-CM

## 2018-11-15 DIAGNOSIS — E876 Hypokalemia: Principal | ICD-10-CM

## 2018-11-15 LAB — BASIC METABOLIC PANEL
Anion gap: 10 (ref 5–15)
BUN: 16 mg/dL (ref 8–23)
CO2: 26 mmol/L (ref 22–32)
Calcium: 8.7 mg/dL — ABNORMAL LOW (ref 8.9–10.3)
Chloride: 103 mmol/L (ref 98–111)
Creatinine, Ser: 0.74 mg/dL (ref 0.44–1.00)
GFR calc Af Amer: >60 mL/min (ref >60)
GFR calc non Af Amer: >60 mL/min (ref >60)
Glucose, Bld: 171 mg/dL — ABNORMAL HIGH (ref 70–99)
Potassium: 3.5 mmol/L (ref 3.5–5.1)
Sodium: 139 mmol/L (ref 135–145)

## 2018-11-15 LAB — PROTIME-INR
INR: 2.2 — ABNORMAL HIGH (ref 0.8–1.2)
Prothrombin Time: 24.3 seconds — ABNORMAL HIGH (ref 11.4–15.2)

## 2018-11-15 LAB — TSH: TSH: 0.434 u[IU]/mL (ref 0.350–4.500)

## 2018-11-15 LAB — GLUCOSE, CAPILLARY
Glucose-Capillary: 270 mg/dL — ABNORMAL HIGH (ref 70–99)
Glucose-Capillary: 140 mg/dL — ABNORMAL HIGH (ref 70–99)
Glucose-Capillary: 155 mg/dL — ABNORMAL HIGH (ref 70–99)
Glucose-Capillary: 175 mg/dL — ABNORMAL HIGH (ref 70–99)
Glucose-Capillary: 244 mg/dL — ABNORMAL HIGH (ref 70–99)

## 2018-11-15 MED ORDER — FUROSEMIDE 20 MG PO TABS
20.0000 mg | ORAL_TABLET | Freq: Every day | ORAL | Status: DC
  Administered 2018-11-16 – 2018-11-19 (×4): 20 mg via ORAL
  Filled 2018-11-15 (×4): qty 1

## 2018-11-15 MED ORDER — POTASSIUM CHLORIDE CRYS ER 20 MEQ PO TBCR
40.0000 meq | EXTENDED_RELEASE_TABLET | Freq: Two times a day (BID) | ORAL | Status: DC
  Administered 2018-11-15 – 2018-11-16 (×3): 40 meq via ORAL
  Filled 2018-11-15 (×3): qty 2

## 2018-11-15 NOTE — Progress Notes (Signed)
ANTICOAGULATION CONSULT NOTE - Follow Up Consult  Pharmacy Consult for Warfarin Indication: atrial fibrillation  Allergies  Allergen Reactions  . Bee Venom Hives    Takes benadryl  . Other Other (See Comments)    Raw foods with seeds give her "boils".  Avoids raw strawberries, blueberries. Tolerates cooked fruits, tomato sauce, bread/grains with seeds. [facility] 10/17/13 Berries with seeds Allergy to glutamine-C-quercet-selen-brom per [facility] 09/25/17  . Alteplase Rash  . Cefepime Rash  . Fentanyl Other (See Comments)    Hallucinations, syncope    . Metformin And Related Diarrhea    Patient Measurements: Height: 5\' 11"  (180.3 cm) Weight: 260 lb 5.8 oz (118.1 kg) IBW/kg (Calculated) : 70.8  Vital Signs: Temp: 97.9 F (36.6 C) (09/14 0837) Temp Source: Oral (09/14 0837) BP: 98/64 (09/14 0837) Pulse Rate: 79 (09/14 0837)  Labs: Recent Labs    11/12/18 1739 11/12/18 2055 11/13/18 0459 11/14/18 0024 11/14/18 0221 11/15/18 0438  HGB 14.7  --  12.5  --  11.0*  --   HCT 47.6*  --  40.3  --  35.4*  --   PLT 259  --  195  --  162  --   LABPROT 25.8*  --   --  23.7*  --  24.3*  INR 2.4*  --   --  2.2*  --  2.2*  CREATININE 1.14*  --  0.99 0.91  --  0.74  TROPONINIHS 19* 19*  --   --   --   --     Estimated Creatinine Clearance: 96.6 mL/min (by C-G formula based on SCr of 0.74 mg/dL).   Assessment:   Anticoag: Warfarin s PTA for afib. INR 2.2 -Coumadin 5 mg daily except 7.5 mg SunWed PTA. Pt's INR on admit was 2.4  Goal of Therapy:  INR 2-3 Monitor platelets by anticoagulation protocol: Yes   Plan:  Coumadin 5mg  today Daily INR  Malakye Nolden S. Alford [place], PharmD, BCPS Clinical Staff Pharmacist Eilene Ghazi [place] 11/15/2018,11:46 AM

## 2018-11-15 NOTE — Progress Notes (Signed)
Patient refused CPAP for the night  

## 2018-11-15 NOTE — Consult Note (Signed)
Cardiology Consultation:   Patient ID: Donna [name] MRN: [id number]; DOB: [birth year]  Admit date: 11/12/2018 Date of Consult: 11/15/2018  Primary Care Provider: Janine Limbo, PA-C Primary Cardiologist: Glenetta Hew, MD  Primary Electrophysiologist:  None   Patient Profile:   Donna [name] is a 68 y.o. female with a hx of permanent atrial fibrillation on warfarin and amiodarone, OSA, on CPAP, DM, and HTN who is being seen today for the evaluation of her permanent atrial fibrillation.  History of Present Illness:   Donna [name] states that over the last year she had a progressive decline in her strength. She notes after a fall last August and she subsequently had to be brought to the emergency department for further evaluation. At that time she was noted to be profoundly hypokalemia and with potassium supplementation significantly improved. Since that time she is called EMS approximately nine times for generalized weakness and inability to get in and out of her car. Her husband has made a make shift lifting device to help her day and in our car and help her move around. She is otherwise confined to a wheelchair. She states that on Friday she subsequently fell and her husband called EMS.  In addition to the history provided above she has noticed intermittent exertional dyspnea and chest pain over the past 2 to 3 weeks. She has a difficult time elaborating on the chest pain. She follows with Dr. Ellyn Hack. She last spoke with him via a telehealth visit in July of this year. At that time her metoprolol was discontinued due to hypotension; however, she was instructed to continue her amiodarone and take her spironolactone more consistently. She subsequently called back and requested to stop her spironolactone due to dysuria. This was therefore stopped. She then called in again requesting discontinue her amiodarone as she states that she felt fine and was having tremors and nausea with the medicine. She has been  off the Amiodarone since July.   Of note she does struggle with chronic hypokalemia and is on 120 mEq QD. This is felt to be secondary to her diuretic use for venous insufficiency. She is currently on furosemide 40 mg QD and Metolazone 10 mg once weekly. She does note that she has had poor PO intake recently due to issues with initiating her swallow and global sensation. She states that this is been progressive over the last couple months. It was initially only to solid foods however has now become noticeable with soft food including grits. She states that if she turned her head to the left coughs she feels better and is able to swallow better.  Past Medical History:  Diagnosis Date  . CHF (congestive heart failure) ([place])   . Chronic /permanent atrial fibrillation ([place]) 2005   On Warfarin (follwed @ [facility]); Rate controlled - On BB  . Chronic diastolic heart failure, NYHA class 2 ([place])    Updated by A. fib, hypertensive heart disease and obesity; EDP was only 7 by cardiac catheterization in September 2017  . Diabetic lumbosacral plexopathy ([place])   . Fibromyalgia   . Generalized anxiety disorder   . Hypokalemia 09/03/2017  . Incontinence   . Major depressive disorder   . OSA on CPAP   . Restless leg syndrome   . Syncope 09/03/2017  . Type II diabetes mellitus ([place])     Past Surgical History:  Procedure Laterality Date  . CARDIAC CATHETERIZATION  11/09/2015   [facility]: Nonocclusive CAD. EF 55%. EDP 7  mmHg.  Marland Kitchen CHOLECYSTECTOMY OPEN    . JOINT REPLACEMENT    . KNEE ARTHROSCOPY Right   . MASS EXCISION Left    sarcoma - shoulder  . SHOULDER OPEN ROTATOR CUFF REPAIR Bilateral   . TONSILLECTOMY    . TOTAL HIP ARTHROPLASTY Right 2015  . TOTAL HIP REVISION Right 2015  . TOTAL KNEE ARTHROPLASTY Right   . TRANSTHORACIC ECHOCARDIOGRAM  11/2013; 02/2016   a) [facility]: with Definity:  EF 60-65%. Mod LA dilation. Mild AoV sclerosis. Normal RHP.  Indeterminate LV filling pressures. b) [facility]: Normal cavity size. EF 55-60%.no RWMA, mild AoV sclerosis (R cusp), Mod MAC. Mild biAtrial dilation.  . TRANSTHORACIC ECHOCARDIOGRAM  08/2017    Mild LVH with normal EF 55-60%.  Unable to fully assess wall motion, but appear to be normal.  Unable to determine diastolic function because of A. fib.  Mild to moderate MAC with mild mitral stenosis (mean gradient 9 mmHg).  Moderate LA dilation.  CVP estimated 3 mmHg.  No obvious evidence of pulmonary hypertension.  Marland Kitchen VAGINAL HYSTERECTOMY      Home Medications:  Prior to Admission medications   Medication Sig Start Date End Date Taking? Authorizing Provider  acetaminophen (TYLENOL) 500 MG tablet Take 500 mg by mouth every 6 (six) hours as needed for mild pain.    Yes [provider]  ALPRAZolam (XANAX) 0.25 MG tablet Take 1 tablet (0.25 mg total) by mouth 3 (three) times daily. 09/07/17  Yes Sheikh, Omair Latif, DO  busPIRone (BUSPAR) 10 MG tablet Take 10 mg by mouth 2 (two) times daily.    Yes [provider]  ciprofloxacin (CIPRO) 250 MG tablet Take 250 mg by mouth every morning.   Yes [provider]  docusate sodium (COLACE) 100 MG capsule Take 100 mg by mouth daily as needed for mild constipation.   Yes [provider]  Dulaglutide (TRULICITY) 1.5 AJ/6.8TL SOPN Inject 1.5 mg into the skin every Saturday.    Yes [provider]  ezetimibe (ZETIA) 10 MG tablet Take 10 mg by mouth at bedtime.    Yes [provider]  ferrous sulfate 325 (65 FE) MG tablet Take 325 mg by mouth See admin instructions. Take one tablet (325 mg) by mouth on Sunday, Monday, Wednesday and Friday mornings.   Yes [provider]  furosemide (LASIX) 40 MG tablet Take 40 mg by mouth daily.   Yes [provider]  gabapentin (NEURONTIN) 600 MG tablet Take 600 mg by mouth 3 (three) times daily.   Yes [provider]  guaiFENesin (MUCINEX) 600 MG 12 hr tablet Take  600 mg by mouth 2 (two) times daily as needed for cough or to loosen phlegm.    Yes [provider]  insulin detemir (LEVEMIR) 100 UNIT/ML injection Inject 0.4 mLs (40 Units total) into the skin at bedtime. Patient taking differently: Inject 60 Units into the skin at bedtime.  09/30/17  Yes Georgette Shell, MD  insulin lispro (HUMALOG KWIKPEN) 100 UNIT/ML KiwkPen Inject 6-14 Units into the skin See admin instructions. Inject 6-14 units subcutaneously before each meal per sliding scale: CBG 201-250 6 units, 251-300 8 units, 301-350 10 units, 351-400 12 units, over 400 14 units and call MD   Yes [provider]  lidocaine (LIDODERM) 5 % Place 1 patch onto the skin daily as needed (right hip pain). Remove & Discard patch within 12 hours or as directed by MD    Yes [provider]  magnesium oxide (MAG-OX) 400 MG tablet Take 1 tablet (400 mg total) by mouth 3 (three) times daily. ALSO TAKE A EXTRA 400 MG TABLET ON THE DAYS YOU TAKE ZAROXOLYN TABLET 02/02/18  Yes Leonie Man, MD  Melatonin 5 MG CAPS Take 5 mg by mouth at bedtime.   Yes [provider]  metolazone (ZAROXOLYN) 10 MG tablet Take 10 mg by mouth once a week.    Yes [provider]  omeprazole (PRILOSEC) 20 MG capsule Take 20 mg by mouth daily.   Yes [provider]  ondansetron (ZOFRAN) 4 MG tablet Take 1 tablet (4 mg total) by mouth every 6 (six) hours as needed for nausea. 09/30/17  Yes Georgette Shell, MD  OxyCODONE HCl, Abuse Deter, (OXAYDO) 5 MG TABA Take 1 tablet by mouth 3 (three) times daily as needed (pain). Patient taking differently: Take 1 tablet by mouth 3 (three) times daily as needed (moderate pain).  09/07/17  Yes Sheikh, Omair Latif, DO  potassium chloride SA (K-DUR,KLOR-CON) 20 MEQ tablet Take 5 tablets (100 mEq total) by mouth daily. TAKE AN EXTRA 20 MEQ ON THE DAYS YOU TAKE ZAROXLYN Patient taking differently: Take 40 mEq by mouth 3 (three) times daily.  02/02/18   Yes Leonie Man, MD  pramipexole (MIRAPEX) 0.125 MG tablet Take 0.125 mg by mouth at bedtime.    Yes [provider]  sertraline (ZOLOFT) 100 MG tablet Take 100 mg by mouth daily.   Yes [provider]  tiZANidine (ZANAFLEX) 4 MG tablet Take 4 mg by mouth 3 (three) times daily.    Yes [provider]  VENLAFAXINE HCL ER PO Take 100 mg by mouth daily with breakfast.    Yes [provider]  vitamin C (ASCORBIC ACID) 500 MG tablet Take 500 mg by mouth daily.   Yes [provider]  warfarin (COUMADIN) 7.5 MG tablet Take 5-7.5 mg by mouth daily. Take 7.5 mg on Sunday and Wednesday and 5 mg all other days   Yes [provider]  amiodarone (PACERONE) 200 MG tablet Take 1 tablet (200 mg total) by mouth daily. Patient not taking: Reported on 11/13/2018 07/03/18   Leonie Man, MD  spironolactone (ALDACTONE) 25 MG tablet Take 1 tablet (25 mg total) by mouth daily. Patient not taking: Reported on 11/13/2018 05/12/18 08/10/18  Leonie Man, MD    Inpatient Medications: Scheduled Meds: . ALPRAZolam  0.25 mg Oral TID  . busPIRone  10 mg Oral BID  . ezetimibe  10 mg Oral QHS  . ferrous sulfate  325 mg Oral Once per day on Sun Mon Wed Fri  . furosemide  40 mg Oral Daily  . gabapentin  600 mg Oral TID  . insulin aspart  0-9 Units Subcutaneous TID WC  . insulin detemir  60 Units Subcutaneous QHS  . magnesium oxide  400 mg Oral TID  . Melatonin  3 mg Oral QHS  . metoprolol succinate  25 mg Oral Daily  . potassium chloride  40 mEq Oral BID  . pramipexole  0.125 mg Oral QHS  . sodium chloride flush  3 mL Intravenous Once  . spironolactone  25 mg Oral Daily  . tiZANidine  4 mg Oral TID  . vitamin C  500 mg Oral Daily  . warfarin  7.5 mg Oral Once per day on Sun Wed   And  . warfarin  5 mg Oral Once per day on Mon Tue Thu Fri Sat  . Warfarin -  Pharmacist Dosing Inpatient   Does not apply q1800   Continuous Infusions:  PRN Meds:  acetaminophen, docusate sodium, oxyCODONE  Allergies:    Allergies  Allergen Reactions  . Bee Venom Hives    Takes benadryl  . Other Other (See Comments)    Raw foods with seeds give her "boils".  Avoids raw strawberries, blueberries. Tolerates cooked fruits, tomato sauce, bread/grains with seeds. [facility] 10/17/13 Berries with seeds Allergy to glutamine-C-quercet-selen-brom per [facility] 09/25/17  . Alteplase Rash  . Cefepime Rash  . Fentanyl Other (See Comments)    Hallucinations, syncope    . Metformin And Related Diarrhea    Social History:   Social History   Socioeconomic History  . Marital status: Married    Spouse name: Not on file  . Number of children: Not on file  . Years of education: Not on file  . Highest education level: Not on file  Occupational History  . Not on file  Social Needs  . Financial resource strain: Not on file  . Food insecurity    Worry: Not on file    Inability: Not on file  . Transportation needs    Medical: Not on file    Non-medical: Not on file  Tobacco Use  . Smoking status: Never Smoker  . Smokeless tobacco: Never Used  Substance and Sexual Activity  . Alcohol use: No  . Drug use: No  . Sexual activity: Yes  Lifestyle  . Physical activity    Days per week: Not on file    Minutes per session: Not on file  . Stress: Not on file  Relationships  . Social Herbalist on phone: Not on file    Gets together: Not on file    Attends religious service: Not on file    Active member of club or organization: Not on file    Attends meetings of clubs or organizations: Not on file    Relationship status: Not on file  . Intimate partner violence    Fear of current or ex partner: Not on file    Emotionally abused: Not on file    Physically abused: Not on file    Forced sexual activity: Not on file  Other Topics Concern  . Not on file  Social History Narrative  . Not on file    Family History:   Family History  Problem Relation Age  of Onset  . Hypertension Mother   . Heart disease Mother   . Leukemia Son     ROS:  Please see the history of present illness.  All other ROS reviewed and negative.     Physical Exam/Data:   Vitals:   11/14/18 1237 11/14/18 2038 11/15/18 0648 11/15/18 0837  BP: 95/67 (!) 85/53 (!) 109/58 98/64  Pulse: 79 84 74 79  Resp: _0 (!) 22  Temp: 97.8 F (36.6 C) 98 F (36.7 C) 97.7 F (36.5 C) 97.9 F (36.6 C)  TempSrc: Oral Oral Oral Oral  SpO2: 98% 100% 100% 98%  Weight:      Height:        Intake/Output Summary (Last 24 hours) at 11/15/2018 1257 Last data filed at 11/15/2018 1211 Gross per 24 hour  Intake 440 ml  Output 1950 ml  Net -1510 ml   Last 3 Weights 11/13/2018 11/12/2018 08/12/2018  Weight (lbs) 260 lb 5.8 oz 260 lb 273 lb  Weight (kg) 118.1 kg 117.935 kg 123.832 kg     Body  mass index is 36.31 kg/m.   General:  Obese female HEENT: Normal Lymph: No adenopathy Neck: No JVD Endocrine:  No thryomegaly Vascular: No carotid bruits; FA pulses 2+ bilaterally without bruits  Cardiac:  Normal S1, S2; irregular rhythm Lungs:  Clear to auscultation bilaterally, no wheezing, rhonchi or rales  Abd: Soft, nontender, no hepatomegaly  Ext: No edema Musculoskeletal:  No deformities, BUE and BLE strength normal and equal Skin: Warm and dry  Neuro:  CNs 2-12 intact, no focal abnormalities noted Psych:  Normal affect   EKG:  The EKG was personally reviewed and demonstrates: Persistent A-fib with RVR. Now rate controlled.   Telemetry:  Telemetry was personally reviewed and demonstrates:  Rate controlled A-fib with 3 beats of NSVT  Relevant CV Studies:  TTE 09/03/2017 - Left ventricle: The cavity size was normal. Wall thickness was   increased in a pattern of mild LVH. Systolic function was normal.   The estimated ejection fraction was in the range of 55% to 60%.   Images were inadequate for LV wall motion assessment.   Indeterminate diastolic function. - Aortic  valve: Mildly to moderately calcified annulus. Trileaflet;   mildly calcified leaflets. There was very mild stenosis. Mean   gradient (S): 9 mm Hg. Peak gradient (S): 16 mm Hg. VTI ratio of   LVOT to aortic valve: 0.48. Valve area (VTI): 1.83 cm^2. Valve   area (Vmax): 1.76 cm^2. Valve area (Vmean): 2 cm^2. - Mitral valve: Moderately calcified annulus. There was trivial   regurgitation. - Left atrium: The atrium was moderately dilated. - Right atrium: Central venous pressure (est): 3 mm Hg. - Atrial septum: No defect or patent foramen ovale was identified. - Tricuspid valve: There was trivial regurgitation. - Pulmonary arteries: Systolic pressure could not be accurately   estimated. - Pericardium, extracardiac: There was no pericardial effusion.  Laboratory Data:  High Sensitivity Troponin:   Recent Labs  Lab 11/12/18 1739 11/12/18 2055  TROPONINIHS 19* 19*     Chemistry Recent Labs  Lab 11/13/18 0459  11/14/18 0024 11/14/18 1441 11/15/18 0438  NA 134*  --  135  --  139  K 2.2*   < > 2.9* 3.7 3.5  CL 93*  --  97*  --  103  CO2 27  --  27  --  26  GLUCOSE 314*  --  353*  --  171*  BUN 20  --  17  --  16  CREATININE 0.99  --  0.91  --  0.74  CALCIUM 8.6*  --  8.4*  --  8.7*  GFRNONAA 59*  --  >60  --  >60  GFRAA >60  --  >60  --  >60  ANIONGAP 14  --  11  --  10   < > = values in this interval not displayed.    No results for input(s): PROT, ALBUMIN, AST, ALT, ALKPHOS, BILITOT in the last 168 hours. Hematology Recent Labs  Lab 11/12/18 1739 11/13/18 0459 11/14/18 0221  WBC 13.4* 11.6* 7.6  RBC 5.86* 4.98 4.36  HGB 14.7 12.5 11.0*  HCT 47.6* 40.3 35.4*  MCV 81.2 80.9 81.2  MCH 25.1* 25.1* 25.2*  MCHC 30.9 31.0 31.1  RDW 17.7* 17.2* 17.2*  PLT 259 195 162   BNPNo results for input(s): BNP, PROBNP in the last 168 hours.  DDimer No results for input(s): DDIMER in the last 168 hours.   Radiology/Studies:  Dg Chest 2 View  Result Date: 11/12/2018 CLINICAL  DATA:  Weakness for 6 weeks. EXAM: CHEST - 2 VIEW COMPARISON:  09/27/2017 and prior radiographs FINDINGS: UPPER limits normal heart size and mild hilar prominence unchanged from remote studies. There is no evidence of focal airspace disease, pulmonary edema, suspicious pulmonary nodule/mass, pleural effusion, or pneumothorax. No acute bony abnormalities are identified. IMPRESSION: No active cardiopulmonary disease. Electronically Signed   By: Margarette Canada M.D.   On: 11/12/2018 18:49    Assessment and Plan:   Donna [name] is a 68 y.o. female with a hx of permanent atrial fibrillation on warfarin and amiodarone, OSA, on CPAP, DM, and HTN who was admitted on 9/11 with generalized weakness in the setting of profound hypokalemia and is being seen today for the evaluation of her permanent atrial fibrillation. She was previously rate controlled on Amiodarone and Metoprolol; however, these were discontinued due to apparent side effects and hypotension. Since getting to the hospital she was initially managed with a Diltiazem drip and has now been transitioned to Metoprolol succinate 25 mg QD. She is currently rate controlled.   Permanent Atrial Fibrillation  - Currently rate controlled on metoprolol succinate 25 mg QD. Would continue for now. If she becomes hypotensive with the medication or has apparent side effects we could consider switching back to amiodarone or even low dose digoxin.  - Continue Warfarin for anticoagulation   Generalized weakness Hypokalemia  - Elevated transtubular potassium gradient (estimated urine osm using UA and serum osm using BMP) indicating renal wasting  - Likely secondary to diuretic use  - Would continue spironolactone if furosemide and metolazone are continued  - SNF placement pending   Oropharyngeal and esophageal dysphagia  - History consistent with both. Seems to be progressive.  - Does have symptoms of GERD which could be the cause but would recommend speech eval with  MBS and DG esophagram - Will defer to primary team.   QTc prolongation 2/2 hypokalemia. Resolved.   Will discuss the case further with Dr. Radford Pax.  For questions or updates, please contact [place] Please consult [email or web address] for contact info under   Signed, Ina Homes, MD  11/15/2018 12:57 PM

## 2018-11-15 NOTE — Progress Notes (Signed)
PROGRESS NOTE    Donna [name]  [id number] DOB: August 24, 1950 DOA: 11/12/2018 PCP: Janine Limbo, PA-C   Brief Narrative: 68 year old with past medical history significant for A. fib on Coumadin and amiodarone, chronic diastolic heart failure diabetes, diabetic lumbosacral plexopathy, OSA on CPAP, restless leg syndrome who presents with generalized weakness.  She report frequent fall at home and most of the time she sits on the chair.  She has been taking Lasix and metolazone, recently stopped taking the spironolactone as advised by her cardiology.  She presented with severe hypokalemia and EKG with prolonged QT.  She was in A. fib RVR.  She was a started on Cardizem drip and supplemental potassium.  Heart rate is controlled she is off of Cardizem.  She was a started on low-dose Toprol.  Will require skilled nursing facility. I have informed cardiology of her admission, they are actively managing her medications as an outpatient.     Assessment & Plan:   Principal Problem:   Hypokalemia Active Problems:   Depression   Type 2 diabetes mellitus (HCC)   Chronic atrial fibrillation with RVR   Leukocytosis  1-severe hypokalemia: She takes 120 potassium every day. Likely related to diuretics.\ Continue with potassium supplementation BID today. She is now on spironolactone.  Repeat lab in am   2-A.Fib  RVR, chronic: She was not on rate control medications due to blood pressure issues.  She was treated initially with IV Cardizem drip, subsequently stopped but.  She was a started on low-dose Toprol. She was previously taking amiodarone but it was discontinued by her provider. She is status post cardioversion I have consulted cardiology for further recommendations  3-LE edema;  Per Dr Ellyn Hack Note, patient doesn't have CHF.   Continue with oral Lasix, metolazone once a week.  4-Prolonged QT; improved after potassium supplementation. 5-Anxiety depression: Continue with buspirone and  Xanax, venlafaxine.  6-Hyperlipidemia: Continue with Zetia  7-History of restless leg syndrome: Continue with pramipexole  8-Diabetes type 2: Continue with insulin.  9-Frequent fall: She will need PT OT will probably require placement.  Unstageable sacrum pressure injury: Likely present on admission. Local care  Pressure Injury 09/29/17 Unstageable - Full thickness tissue loss in which the base of the ulcer is covered by slough (yellow, tan, gray, green or brown) and/or eschar (tan, brown or black) in the wound bed. (Active)  09/29/17 1426  Location: Sacrum  Location Orientation: Mid  Staging: Unstageable - Full thickness tissue loss in which the base of the ulcer is covered by slough (yellow, tan, gray, green or brown) and/or eschar (tan, brown or black) in the wound bed.  Wound Description (Comments):   Present on Admission:     Estimated body mass index is 36.31 kg/m as calculated from the following:   Height as of this encounter: 5\' 11"  (1.803 m).   Weight as of this encounter: 118.1 kg.   DVT prophylaxis: Coumadin Code Status: Full code Family Communication: Care discussed with patient Disposition Plan: Monitor blood pressure, cardiology consultation, PT skilled placement Consultants:   Cardiology  Procedures:   None  Antimicrobials:  None  Subjective: She is alert and oriented x3.  She denies dyspnea.  She is complaining of bilateral hip pain.  Objective: Vitals:   11/14/18 1237 11/14/18 2038 11/15/18 0648 11/15/18 0837  BP: 95/67 (!) 85/53 (!) 109/58 98/64  Pulse: 79 84 74 79  Resp: 13 19 19  (!) 22  Temp: 97.8 F (36.6 C) 98 F (36.7 C) 97.7 F (36.5  C) 97.9 F (36.6 C)  TempSrc: Oral Oral Oral Oral  SpO2: 98% 100% 100% 98%  Weight:      Height:        Intake/Output Summary (Last 24 hours) at 11/15/2018 1144 Last data filed at 11/14/2018 2059 Gross per 24 hour  Intake 200 ml  Output 1750 ml  Net -1550 ml   Filed Weights   11/12/18 1727  11/13/18 0614  Weight: 117.9 kg 118.1 kg    Examination:  General exam: Appears calm and comfortable  Respiratory system: Clear to auscultation. Respiratory effort normal. Cardiovascular system: S1 & S2 heard, RRR. No JVD, murmurs, rubs, gallops or clicks. No pedal edema. Gastrointestinal system: Abdomen is nondistended, soft and nontender. No organomegaly or masses felt. Normal bowel sounds heard. Central nervous system: Alert and oriented. No focal neurological deficits. Extremities: Symmetric 5 x 5 power. Skin: No rashes, lesions or ulcers Psychiatry: Judgement and insight appear normal. Mood & affect appropriate.     Data Reviewed: I have personally reviewed following labs and imaging studies  CBC: Recent Labs  Lab 11/12/18 1739 11/13/18 0459 11/14/18 0221  WBC 13.4* 11.6* 7.6  HGB 14.7 12.5 11.0*  HCT 47.6* 40.3 35.4*  MCV 81.2 80.9 81.2  PLT 259 195 0000000   Basic Metabolic Panel: Recent Labs  Lab 11/12/18 1739 11/13/18 0459 11/13/18 1327 11/13/18 2329 11/14/18 0024 11/14/18 1441 11/15/18 0438  NA 136 134*  --   --  135  --  139  K <2.0* 2.2* 2.5* 3.0* 2.9* 3.7 3.5  CL 87* 93*  --   --  97*  --  103  CO2 30 27  --   --  27  --  26  GLUCOSE 345* 314*  --   --  353*  --  171*  BUN 17 20  --   --  17  --  16  CREATININE 1.14* 0.99  --   --  0.91  --  0.74  CALCIUM 9.7 8.6*  --   --  8.4*  --  8.7*  MG  --  2.1  --   --   --   --   --    GFR: Estimated Creatinine Clearance: 96.6 mL/min (by C-G formula based on SCr of 0.74 mg/dL). Liver Function Tests: No results for input(s): AST, ALT, ALKPHOS, BILITOT, PROT, ALBUMIN in the last 168 hours. No results for input(s): LIPASE, AMYLASE in the last 168 hours. No results for input(s): AMMONIA in the last 168 hours. Coagulation Profile: Recent Labs  Lab 11/12/18 1739 11/14/18 0024 11/15/18 0438  INR 2.4* 2.2* 2.2*   Cardiac Enzymes: No results for input(s): CKTOTAL, CKMB, CKMBINDEX, TROPONINI in the last 168  hours. BNP (last 3 results) No results for input(s): PROBNP in the last 8760 hours. HbA1C: Recent Labs    11/13/18 0459  HGBA1C 7.5*   CBG: Recent Labs  Lab 11/14/18 1240 11/14/18 1712 11/14/18 2106 11/15/18 0607 11/15/18 1119  GLUCAP 191* 228* 304* 140* 155*   Lipid Profile: No results for input(s): CHOL, HDL, LDLCALC, TRIG, CHOLHDL, LDLDIRECT in the last 72 hours. Thyroid Function Tests: Recent Labs    11/15/18 0438  TSH 0.434   Anemia Panel: No results for input(s): VITAMINB12, FOLATE, FERRITIN, TIBC, IRON, RETICCTPCT in the last 72 hours. Sepsis Labs: No results for input(s): PROCALCITON, LATICACIDVEN in the last 168 hours.  Recent Results (from the past 240 hour(s))  SARS Coronavirus 2 [facility] order, Performed in [facility] hospital lab) Nasopharyngeal  Nasopharyngeal Swab     Status: None   Collection Time: 11/12/18 11:42 PM   Specimen: Nasopharyngeal Swab  Result Value Ref Range Status   SARS Coronavirus 2 NEGATIVE NEGATIVE Final    Comment: (NOTE) If result is NEGATIVE SARS-CoV-2 target nucleic acids are NOT DETECTED. The SARS-CoV-2 RNA is generally detectable in upper and lower  respiratory specimens during the acute phase of infection. The lowest  concentration of SARS-CoV-2 viral copies this assay can detect is 250  copies / mL. A negative result does not preclude SARS-CoV-2 infection  and should not be used as the sole basis for treatment or other  patient management decisions.  A negative result may occur with  improper specimen collection / handling, submission of specimen other  than nasopharyngeal swab, presence of viral mutation(s) within the  areas targeted by this assay, and inadequate number of viral copies  (<250 copies / mL). A negative result must be combined with clinical  observations, patient history, and epidemiological information. If result is POSITIVE SARS-CoV-2 target nucleic acids are DETECTED. The SARS-CoV-2 RNA is generally  detectable in upper and lower  respiratory specimens dur ing the acute phase of infection.  Positive  results are indicative of active infection with SARS-CoV-2.  Clinical  correlation with patient history and other diagnostic information is  necessary to determine patient infection status.  Positive results do  not rule out bacterial infection or co-infection with other viruses. If result is PRESUMPTIVE POSTIVE SARS-CoV-2 nucleic acids MAY BE PRESENT.   A presumptive positive result was obtained on the submitted specimen  and confirmed on repeat testing.  While 2019 novel coronavirus  (SARS-CoV-2) nucleic acids may be present in the submitted sample  additional confirmatory testing may be necessary for epidemiological  and / or clinical management purposes  to differentiate between  SARS-CoV-2 and other Sarbecovirus currently known to infect humans.  If clinically indicated additional testing with an alternate test  methodology [phone]) is advised. The SARS-CoV-2 RNA is generally  detectable in upper and lower respiratory sp ecimens during the acute  phase of infection. The expected result is Negative. Fact Sheet for Patients:  StrictlyIdeas.no Fact Sheet for Healthcare Providers: BankingDealers.co.za This test is not yet approved or cleared by the [place] FDA and has been authorized for detection and/or diagnosis of SARS-CoV-2 by FDA under an Emergency Use Authorization (EUA).  This EUA will remain in effect (meaning this test can be used) for the duration of the COVID-19 declaration under Section 564(b)(1) of the Act, 21 U.S.C. section 360bbb-3(b)(1), unless the authorization is terminated or revoked sooner. Performed at [facility], [place] [address], [place], [place] [address]          Radiology Studies: No results found.      Scheduled Meds: . ALPRAZolam  0.25 mg Oral TID  . busPIRone  10 mg Oral BID  .  ezetimibe  10 mg Oral QHS  . ferrous sulfate  325 mg Oral Once per day on Sun Mon Wed Fri  . furosemide  40 mg Oral Daily  . gabapentin  600 mg Oral TID  . insulin aspart  0-9 Units Subcutaneous TID WC  . insulin detemir  60 Units Subcutaneous QHS  . magnesium oxide  400 mg Oral TID  . Melatonin  3 mg Oral QHS  . metoprolol succinate  25 mg Oral Daily  . potassium chloride  40 mEq Oral BID  . pramipexole  0.125 mg Oral QHS  . sodium  chloride flush  3 mL Intravenous Once  . spironolactone  25 mg Oral Daily  . tiZANidine  4 mg Oral TID  . vitamin C  500 mg Oral Daily  . warfarin  7.5 mg Oral Once per day on Sun Wed   And  . warfarin  5 mg Oral Once per day on Mon Tue Thu Fri Sat  . Warfarin - Pharmacist Dosing Inpatient   Does not apply q1800   Continuous Infusions:   LOS: 2 days    Time spent:35 minutes.     Elmarie Shiley, MD [facility] Pager [phone]  If 7PM-7AM, please contact night-coverage [email or web address] Password [facility] 11/15/2018, 11:44 AM

## 2018-11-15 NOTE — TOC Progression Note (Signed)
Transition of Care [facility]) - Progression Note    Patient Details  Name: Donna [name] MRN: [id number] Date of Birth: [birth year]  Transition of Care [facility]) CM/SW [place], [place] Phone Number: 11/15/2018, 3:15 PM  Clinical Narrative:     CSW visit with the patient at bedside to discuss placement options. Patient accepted bed offer with [facility] .  Patient will need COVID test 24-28 hrs prior to discharge to SNF.   Patient PSARR remains pending.  Thurmond Butts, MSW, [facility] Clinical Social Worker [phone]   Expected Discharge Plan: Skilled Nursing Facility Barriers to Discharge: Continued Medical Work up  Expected Discharge Plan and Services Expected Discharge Plan: [place] In-house Referral: Clinical Social Work     Living arrangements for the past 2 months: Single Family Home                                       Social Determinants of Health (SDOH) Interventions    Readmission Risk Interventions No flowsheet data found.

## 2018-11-15 NOTE — Discharge Instructions (Signed)

## 2018-11-15 NOTE — Progress Notes (Signed)
[facility]    NEPHROLOGY PROGRESS NOTE  SUBJECTIVE: No acute complaints today.  Denies chest pain, shortness of breath, nausea, vomiting, diarrhea or dysuria.  All other review of systems are negative.     OBJECTIVE:  Vitals:   11/15/18 0648 11/15/18 0837  BP: (!) 109/58 98/64  Pulse: 74 79  Resp: 19 (!) 22  Temp: 97.7 F (36.5 C) 97.9 F (36.6 C)  SpO2: 100% 98%    Intake/Output Summary (Last 24 hours) at 11/15/2018 1600 Last data filed at 11/15/2018 1300 Gross per 24 hour  Intake 680 ml  Output 1950 ml  Net -1270 ml      General:  AAOx3 NAD HEENT: MMM [place] AT anicteric sclera Neck:  No JVD, no adenopathy CV:  Heart RRR  Lungs:  L/S CTA bilaterally Abd:  abd SNT/ND with normal BS GU:  Bladder non-palpable Extremities:  No LE edema. Skin:  No skin rash  MEDICATIONS:  . ALPRAZolam  0.25 mg Oral TID  . busPIRone  10 mg Oral BID  . ezetimibe  10 mg Oral QHS  . ferrous sulfate  325 mg Oral Once per day on Sun Mon Wed Fri  . [START ON 11/16/2018] furosemide  20 mg Oral Daily  . gabapentin  600 mg Oral TID  . insulin aspart  0-9 Units Subcutaneous TID WC  . insulin detemir  60 Units Subcutaneous QHS  . magnesium oxide  400 mg Oral TID  . Melatonin  3 mg Oral QHS  . metoprolol succinate  25 mg Oral Daily  . potassium chloride  40 mEq Oral BID  . pramipexole  0.125 mg Oral QHS  . sodium chloride flush  3 mL Intravenous Once  . spironolactone  25 mg Oral Daily  . tiZANidine  4 mg Oral TID  . vitamin C  500 mg Oral Daily  . warfarin  7.5 mg Oral Once per day on Sun Wed   And  . warfarin  5 mg Oral Once per day on Mon Tue Thu Fri Sat  . Warfarin - Pharmacist Dosing Inpatient   Does not apply q1800       LABS:   CBC Latest Ref Rng & Units 11/14/2018 11/13/2018 11/12/2018  WBC 4.0 - 10.5 K/uL 7.6 11.6(H) 13.4(H)  Hemoglobin 12.0 - 15.0 g/dL 11.0(L) 12.5 14.7  Hematocrit 36.0 - 46.0 % 35.4(L) 40.3 47.6(H)  Platelets 150 - 400 K/uL 162 195 259     CMP Latest Ref Rng & Units 11/15/2018 11/14/2018 11/14/2018  Glucose 70 - 99 mg/dL 171(H) - 353(H)  BUN 8 - 23 mg/dL 16 - 17  Creatinine 0.44 - 1.00 mg/dL 0.74 - 0.91  Sodium 135 - 145 mmol/L 139 - 135  Potassium 3.5 - 5.1 mmol/L 3.5 3.7 2.9(L)  Chloride 98 - 111 mmol/L 103 - 97(L)  CO2 22 - 32 mmol/L 26 - 27  Calcium 8.9 - 10.3 mg/dL 8.7(L) - 8.4(L)  Total Protein 6.0 - 8.5 g/dL - - -  Total Bilirubin 0.0 - 1.2 mg/dL - - -  Alkaline Phos 39 - 117 IU/L - - -  AST 0 - 40 IU/L - - -  ALT 0 - 32 IU/L - - -    Lab Results  Component Value Date   CALCIUM 8.7 (L) 11/15/2018   PHOS 3.7 09/07/2017       Component Value Date/Time   COLORURINE YELLOW 11/13/2018 [place] 11/13/2018 1244   LABSPEC 1.013 11/13/2018 1244  PHURINE 6.0 11/13/2018 [place] 11/13/2018 [place] 11/13/2018 [place] 11/13/2018 [place] 11/13/2018 1244   PROTEINUR NEGATIVE 11/13/2018 1244   UROBILINOGEN 1.0 09/06/2010 1130   NITRITE NEGATIVE 11/13/2018 1244   LEUKOCYTESUR NEGATIVE 11/13/2018 1244   No results found for: PHART, PCO2ART, PO2ART, HCO3, TCO2, ACIDBASEDEF, O2SAT     Component Value Date/Time   IRON 24 (L) 09/06/2017 0502   TIBC 214 (L) 09/06/2017 0502   FERRITIN 316 (H) 09/06/2017 0502   IRONPCTSAT 11 09/06/2017 0502       ASSESSMENT/PLAN:     1.  Severe hypokalemia.  This is been recurrent.  She takes 120 mEq of potassium daily.  She is on Lasix and metolazone at home for diastolic heart failure.  Her magnesium level has been normal.  Given her markedly low potassium, she would have at least a 300 mEq potassium deficit.  Will check a TSH.  No signs or symptoms of an insulinoma.  Will check urine electrolytes to determine if this is renal losses of potassium versus malabsorption.  Denies any GI losses.  Magnesium level is fine.  Ultimately suspect this is related to longstanding use of loop diuretics.  Would use  potassium sparing diuretics.  Given normal potassium levels prior to the use of significant diuretics, no evidence of a genetic syndrome.  2.  Chronic diastolic congestive heart failure.  As above, will continue furosemide but add spironolactone.  3.  Atrial fibrillation with RVR.  Heart rate improved.  4.  Type 2 diabetes.  Continue current management.  Okay for disposition from renal standpoint.  Serum potassium level stable on current regimen.    [place], DO, [place]

## 2018-11-15 NOTE — Progress Notes (Signed)
Inpatient Diabetes Program Recommendations  AACE/ADA: New Consensus Statement on Inpatient Glycemic Control (2015)  Target Ranges:  Prepandial:   less than 140 mg/dL      Peak postprandial:   less than 180 mg/dL (1-2 hours)      Critically ill patients:  140 - 180 mg/dL   Lab Results  Component Value Date   GLUCAP 140 (H) 11/15/2018   HGBA1C 7.5 (H) 11/13/2018    Review of Glycemic Control Results for [name], Donna [name] (MRN [id number]) as of 11/15/2018 11:16  Ref. Range 11/14/2018 06:22 11/14/2018 12:40 11/14/2018 17:12 11/14/2018 21:06 11/15/2018 06:07  Glucose-Capillary Latest Ref Range: 70 - 99 mg/dL 187 (H) 191 (H) 228 (H) 304 (H) 140 (H)   Diabetes history: DM 2 Outpatient Diabetes medications: Trulicity 1.5 mg QSaturday, Levemir 60 units, Humalog 6-14 units tid  Current orders for Inpatient glycemic control:  Levemir 60 units qhs Novolog 0-9 units tid  A1c 7.5% on 9/12  Inpatient Diabetes Program Recommendations:    Glucose trends increase after each meal resulting in 300 glucose by the end of the day.   Consider Novolog 3 units tid meal coverage if patient consumes at least 50% of meals.  Thanks,  Tama Headings RN, MSN, BC-ADM Inpatient Diabetes Coordinator Team Pager [phone] (8a-5p)

## 2018-11-16 ENCOUNTER — Inpatient Hospital Stay (HOSPITAL_COMMUNITY): Payer: Medicare Other

## 2018-11-16 DIAGNOSIS — I4891 Unspecified atrial fibrillation: Secondary | ICD-10-CM

## 2018-11-16 LAB — BASIC METABOLIC PANEL
Anion gap: 11 (ref 5–15)
BUN: 14 mg/dL (ref 8–23)
CO2: 28 mmol/L (ref 22–32)
Calcium: 8.7 mg/dL — ABNORMAL LOW (ref 8.9–10.3)
Chloride: 102 mmol/L (ref 98–111)
Creatinine, Ser: 0.71 mg/dL (ref 0.44–1.00)
GFR calc Af Amer: >60 mL/min (ref >60)
GFR calc non Af Amer: >60 mL/min (ref >60)
Glucose, Bld: 102 mg/dL — ABNORMAL HIGH (ref 70–99)
Potassium: 3 mmol/L — ABNORMAL LOW (ref 3.5–5.1)
Sodium: 141 mmol/L (ref 135–145)

## 2018-11-16 LAB — PROTIME-INR
INR: 2.1 — ABNORMAL HIGH (ref 0.8–1.2)
Prothrombin Time: 23.6 seconds — ABNORMAL HIGH (ref 11.4–15.2)

## 2018-11-16 LAB — CBC
HCT: 37.8 % (ref 36.0–46.0)
Hemoglobin: 11.8 g/dL — ABNORMAL LOW (ref 12.0–15.0)
MCH: 26.2 pg (ref 26.0–34.0)
MCHC: 31.2 g/dL (ref 30.0–36.0)
MCV: 83.8 fL (ref 80.0–100.0)
Platelets: 177 10*3/uL (ref 150–400)
RBC: 4.51 MIL/uL (ref 3.87–5.11)
RDW: 18.1 % — ABNORMAL HIGH (ref 11.5–15.5)
WBC: 8.9 10*3/uL (ref 4.0–10.5)
nRBC: 0 % (ref 0.0–0.2)

## 2018-11-16 LAB — GLUCOSE, CAPILLARY
Glucose-Capillary: 107 mg/dL — ABNORMAL HIGH (ref 70–99)
Glucose-Capillary: 150 mg/dL — ABNORMAL HIGH (ref 70–99)
Glucose-Capillary: 262 mg/dL — ABNORMAL HIGH (ref 70–99)
Glucose-Capillary: 211 mg/dL — ABNORMAL HIGH (ref 70–99)

## 2018-11-16 MED ORDER — POTASSIUM CHLORIDE CRYS ER 20 MEQ PO TBCR
40.0000 meq | EXTENDED_RELEASE_TABLET | Freq: Three times a day (TID) | ORAL | Status: DC
  Administered 2018-11-16 – 2018-11-17 (×5): 40 meq via ORAL
  Filled 2018-11-16 (×5): qty 2

## 2018-11-16 MED ORDER — METOPROLOL SUCCINATE ER 25 MG PO TB24
37.5000 mg | ORAL_TABLET | Freq: Every day | ORAL | Status: DC
  Administered 2018-11-17 – 2018-11-19 (×3): 37.5 mg via ORAL
  Filled 2018-11-16 (×3): qty 2

## 2018-11-16 MED ORDER — METOPROLOL SUCCINATE ER 25 MG PO TB24
12.5000 mg | ORAL_TABLET | Freq: Once | ORAL | Status: AC
  Administered 2018-11-16: 12.5 mg via ORAL
  Filled 2018-11-16: qty 1

## 2018-11-16 NOTE — Progress Notes (Addendum)
Progress Note  Patient Name: Donna [name] Date of Encounter: 11/17/2018 Primary Cardiologist: Glenetta Hew, MD   Subjective   HR now controlled in the 70's on higher dose of Toprol  Denies any chest pain or SOB.  Complains of severe back pain.  Inpatient Medications    Scheduled Meds: . ALPRAZolam  0.25 mg Oral TID  . busPIRone  10 mg Oral BID  . ezetimibe  10 mg Oral QHS  . ferrous sulfate  325 mg Oral Once per day on Sun Mon Wed Fri  . furosemide  20 mg Oral Daily  . gabapentin  600 mg Oral TID  . insulin aspart  0-9 Units Subcutaneous TID WC  . insulin detemir  60 Units Subcutaneous QHS  . magnesium oxide  400 mg Oral TID  . Melatonin  3 mg Oral QHS  . [START ON 11/17/2018] metoprolol succinate  37.5 mg Oral Daily  . potassium chloride  40 mEq Oral TID  . pramipexole  0.125 mg Oral QHS  . sodium chloride flush  3 mL Intravenous Once  . spironolactone  25 mg Oral Daily  . tiZANidine  4 mg Oral TID  . vitamin C  500 mg Oral Daily  . warfarin  7.5 mg Oral Once per day on Sun Wed   And  . warfarin  5 mg Oral Once per day on Mon Tue Thu Fri Sat  . Warfarin - Pharmacist Dosing Inpatient   Does not apply q1800   Continuous Infusions:  PRN Meds: acetaminophen, docusate sodium, oxyCODONE   Vital Signs    Vitals:   11/16/18 1302 11/16/18 2004 11/16/18 2100 11/16/18 2200  BP:  114/71 (!) 92/55 (!) 90/56  Pulse: 86 77    Resp:  16 (!) 25 20  Temp:  99.5 F (37.5 C)    TempSrc:  Oral    SpO2:  95%    Weight:      Height:        Intake/Output Summary (Last 24 hours) at 11/16/2018 2314 Last data filed at 11/16/2018 1046 Gross per 24 hour  Intake 200 ml  Output 750 ml  Net -550 ml   Filed Weights   11/12/18 1727 11/13/18 0614  Weight: 117.9 kg 118.1 kg    Physical Exam   General: Ill appearing, NAD Neck: Negative for carotid bruits. No JVD Lungs:Clear to ausculation bilaterally. No wheezes Cardiovascular: Irregularly irregular with S1 S2. No murmur  Abdomen: Soft, non-tender, non-distended. No obvious abdominal masses. Extremities:No edema. No clubbing or cyanosis. DP pulses 1+ bilaterally Neuro: Alert and oriented. No focal deficits. No facial asymmetry. MAE spontaneously. Psych: Responds to questions appropriately with normal affect.    Labs    Chemistry Recent Labs  Lab 11/14/18 0024 11/14/18 1441 11/15/18 0438 11/16/18 0752  NA 135  --  139 141  K 2.9* 3.7 3.5 3.0*  CL 97*  --  103 102  CO2 27  --  26 28  GLUCOSE 353*  --  171* 102*  BUN 17  --  16 14  CREATININE 0.91  --  0.74 0.71  CALCIUM 8.4*  --  8.7* 8.7*  GFRNONAA >60  --  >60 >60  GFRAA >60  --  >60 >60  ANIONGAP 11  --  10 11     Hematology Recent Labs  Lab 11/13/18 0459 11/14/18 0221 11/16/18 0752  WBC 11.6* 7.6 8.9  RBC 4.98 4.36 4.51  HGB 12.5 11.0* 11.8*  HCT 40.3 35.4*  37.8  MCV 80.9 81.2 83.8  MCH 25.1* 25.2* 26.2  MCHC 31.0 31.1 31.2  RDW 17.2* 17.2* 18.1*  PLT 195 162 177    Cardiac EnzymesNo results for input(s): TROPONINI in the last 168 hours. No results for input(s): TROPIPOC in the last 168 hours.   BNPNo results for input(s): BNP, PROBNP in the last 168 hours.   DDimer No results for input(s): DDIMER in the last 168 hours.   Radiology    Dg Pelvis 1-2 Views  Result Date: 11/16/2018 CLINICAL DATA:  Hip pain EXAM: PELVIS - 1-2 VIEW COMPARISON:  09/03/2017 FINDINGS: Examination is significantly limited secondary to poor penetration from patient body habitus. Right total hip arthroplasty, grossly intact. Degenerative changes of the left hip and bilateral SI joints. No evidence of fracture or diastasis. Sacrum and right iliac bone are largely obscured by overlying bowel gas. IMPRESSION: Limited exam.  No obvious osseous abnormality. Electronically Signed   By: Davina Poke M.D.   On: 11/16/2018 14:23    Telemetry    11/16/2018 AF with HR in the 70's  - Personally Reviewed  ECG    No new EKG to review- Personally Reviewed   Cardiac Studies   TTE 09/03/2017 - Left ventricle: The cavity size was normal. Wall thickness was increased in a pattern of mild LVH. Systolic function was normal. The estimated ejection fraction was in the range of 55% to 60%. Images were inadequate for LV wall motion assessment. Indeterminate diastolic function. - Aortic valve: Mildly to moderately calcified annulus. Trileaflet; mildly calcified leaflets. There was very mild stenosis. Mean gradient (S): 9 mm Hg. Peak gradient (S): 16 mm Hg. VTI ratio of LVOT to aortic valve: 0.48. Valve area (VTI): 1.83 cm^2. Valve area (Vmax): 1.76 cm^2. Valve area (Vmean): 2 cm^2. - Mitral valve: Moderately calcified annulus. There was trivial regurgitation. - Left atrium: The atrium was moderately dilated. - Right atrium: Central venous pressure (est): 3 mm Hg. - Atrial septum: No defect or patent foramen ovale was identified. - Tricuspid valve: There was trivial regurgitation. - Pulmonary arteries: Systolic pressure could not be accurately estimated. - Pericardium, extracardiac: There was no pericardial effusion.  Patient Profile     68 y.o. female with a hx of permanent atrial fibrillation on warfarin and amiodarone, OSA, on CPAP, DM, and HTN who is being seen today for the evaluation of her permanent atrial fibrillation.  Assessment & Plan    1. Permanent atrial fibrillation: -Has known permanent AF on AC with warfarin and rhythm control who was admitted for generalized weakness in the setting of profound hypokalemia. She was previously rate controlled on metoprolol and amiodarone however BB discontinued secondary to hypotension and amio and spiro d/c due to tremors and dysuria.  -HR controlled on Toprol XL 37.5mg  daily -Continue warfarin, no signs of acute bleeding  -INR today 2>>dosing per pharmacy   2. Hypokalemia: -Likely in the setting of over diuresis>>improved today at 4.3 -Nephrology consulted>>states that she  was admitted with at least a 363meq deficit  -Would continue to hold metolazone>>continue low dose Lasix at 20mg  QD -remains euvolemic on exam -CXR with no evidence of CHF  -Management per primary team   3. Hypotension: -improving - Bp soft overnight but 106/84mmhg this am -Continue Toprol XL 37.5mg  daily and cut spiro back to 12.5mg  daily  [facility] will sign off.   Medication Recommendations:  Spironolactone 12.5mg  daily, Toprol SL 37.5mg  daily, Lasix 20mg  daily and warfarin per pharmacy Other recommendations (labs, testing, etc):  BMET in 1 week Follow up as an outpatient:  Followup with Dr. Ellyn Hack in 7-10 days  Signed, Fransico Him, MD [place] Pager: [phone]

## 2018-11-16 NOTE — Progress Notes (Signed)
Physical Therapy Treatment Patient Details Name: Donna [name] MRN: [id number] DOB: [birth year] Today's Date: 11/16/2018    History of Present Illness Pt is a 68 y.o. female admitted 11/12/18 with generalized weakness. Found to be in afib with RVR, severe hypokalemia. PMH includes CHF, permanent afib, fibromyalgia, anxiety, depression, DM2, RLS.    PT Comments    Pt working with OT on entry, eager to get to chair with therapy assist. Pt is currently limited in safe mobility by bilateral hip and knee pain as well as decreased strength and endurance. Pt requires modA for coming to EoB and maxAx2 for lateral scoot transfer to drop arm recliner. D/c plans remain appropriate at this time. PT will continue to follow acutely.     Follow Up Recommendations  SNF;Supervision/Assistance - 24 hour     Equipment Recommendations  Other (comment)(TBD at next venue)       Precautions / Restrictions Precautions Precautions: Fall Restrictions Weight Bearing Restrictions: Yes RLE Weight Bearing: Weight bearing as tolerated LLE Weight Bearing: Weight bearing as tolerated    Mobility  Bed Mobility Overal bed mobility: Needs Assistance Bed Mobility: Supine to Sit     Supine to sit: HOB elevated;Mod assist     General bed mobility comments: ModA to assist hips to EOB, grimacing in pain, cues to use bed rail and assist; anxious due to pain;max vc for sequencing and for pt to engage   Transfers Overall transfer level: Needs assistance   Transfers: Lateral/Scoot Transfers          Lateral/Scoot Transfers: Max assist;+2 physical assistance General transfer comment: lateral scoot from EOB to drop arm recliner;attempted standing 2x to reposition in recliner, maxA+2 to clear buttocks off seat        Balance Overall balance assessment: Needs assistance Sitting-balance support: No upper extremity supported;Feet supported Sitting balance-Leahy Scale: Fair       Standing balance-Leahy Scale:  Zero                              Cognition Arousal/Alertness: Awake/alert Behavior During Therapy: Anxious Overall Cognitive Status: No family/caregiver present to determine baseline cognitive functioning Area of Impairment: Attention;Problem solving                   Current Attention Level: Selective         Problem Solving: Requires verbal cues General Comments: Pt reports she has memory difficulty at baseline and reports difficulty with word finding is baseline as well         General Comments General comments (skin integrity, edema, etc.): pt reports sacral wound, currently with clean dry foam dressing. Pt educated on need to move back to bed when sacrum gets sore      Pertinent Vitals/Pain Pain Assessment: Faces Faces Pain Scale: Hurts whole lot Pain Location: sacrum Pain Descriptors / Indicators: Grimacing;Guarding;Crying Pain Intervention(s): Monitored during session;Limited activity within patient's tolerance;Repositioned    Home Living Family/patient expects to be discharged to:: Skilled nursing facility               Additional Comments: Lives with husband    Prior Function Level of Independence: Needs assistance  Gait / Transfers Assistance Needed: Limited household ambulation with RW vs use of w/c. Pt with increased difficulty past few weeks, unable to stand, husband assists with pivots from BSC<>chair ADL's / Homemaking Assistance Needed: Has been using purewick at home. Husband assists with ADLs and household tasks  PT Goals (current goals can now be found in the care plan section) Acute Rehab PT Goals Patient Stated Goal: Be able to stand and walk again PT Goal Formulation: With patient Time For Goal Achievement: 11/27/18 Potential to Achieve Goals: Fair Progress towards PT goals: Progressing toward goals    Frequency    Min 2X/week      PT Plan Current plan remains appropriate    Co-evaluation PT/OT/SLP  Co-Evaluation/Treatment: Yes Reason for Co-Treatment: Complexity of the patient's impairments (multi-system involvement) PT goals addressed during session: Mobility/safety with mobility;Balance OT goals addressed during session: ADL's and self-care      AM-PAC PT "6 Clicks" Mobility   Outcome Measure  Help needed turning from your back to your side while in a flat bed without using bedrails?: A Lot Help needed moving from lying on your back to sitting on the side of a flat bed without using bedrails?: A Lot Help needed moving to and from a bed to a chair (including a wheelchair)?: Total Help needed standing up from a chair using your arms (e.g., wheelchair or bedside chair)?: Total Help needed to walk in hospital room?: Total Help needed climbing 3-5 steps with a railing? : Total 6 Click Score: 8    End of Session Equipment Utilized During Treatment: Gait belt Activity Tolerance: Patient limited by fatigue Patient left: in chair;with call bell/phone within reach Nurse Communication: Mobility status;Need for lift equipment PT Visit Diagnosis: Other abnormalities of gait and mobility (R26.89);Pain Pain - part of body: Hand;Leg     Time: [id number] PT Time Calculation (min) (ACUTE ONLY): 14 min  Charges:  $Therapeutic Activity: 8-22 mins                     Panagiotis Oelkers B. Migdalia Dk PT, DPT Acute Rehabilitation Services Pager [phone] Office [phone]    [place] 11/16/2018, 2:12 PM

## 2018-11-16 NOTE — Progress Notes (Signed)
ANTICOAGULATION CONSULT NOTE - Follow Up Consult  Pharmacy Consult for Warfarin Indication: atrial fibrillation  Allergies  Allergen Reactions  . Bee Venom Hives    Takes benadryl  . Other Other (See Comments)    Raw foods with seeds give her "boils".  Avoids raw strawberries, blueberries. Tolerates cooked fruits, tomato sauce, bread/grains with seeds. [facility] 10/17/13 Berries with seeds Allergy to glutamine-C-quercet-selen-brom per [facility] 09/25/17  . Alteplase Rash  . Cefepime Rash  . Fentanyl Other (See Comments)    Hallucinations, syncope    . Metformin And Related Diarrhea    Patient Measurements: Height: 5\' 11"  (180.3 cm) Weight: 260 lb 5.8 oz (118.1 kg) IBW/kg (Calculated) : 70.8  Vital Signs: Temp: 98.3 F (36.8 C) (09/15 0335) Temp Source: Oral (09/15 0335) BP: 140/62 (09/15 0903) Pulse Rate: 105 (09/15 0903)  Labs: Recent Labs    11/14/18 0024 11/14/18 0221 11/15/18 0438 11/16/18 0356 11/16/18 0752  HGB  --  11.0*  --   --  11.8*  HCT  --  35.4*  --   --  37.8  PLT  --  162  --   --  177  LABPROT 23.7*  --  24.3* 23.6*  --   INR 2.2*  --  2.2* 2.1*  --   CREATININE 0.91  --  0.74  --  0.71    Estimated Creatinine Clearance: 96.6 mL/min (by C-G formula based on SCr of 0.71 mg/dL).   Assessment:  Anticoag: Warfarin PTA for afib. INR 2.1. Hgb 11.8. Plts WNL -Coumadin 5 mg daily except 7.5 mg SunWed PTA. Pt's INR on admit was 2.4  Goal of Therapy:  INR 2-3 Monitor platelets by anticoagulation protocol: Yes   Plan:  Coumadin 5 mg daily except 7.5 mg SunWed  Daily INR  Cj Edgell S. Alford Highland, PharmD, BCPS Clinical Staff Pharmacist Eilene Ghazi Stillinger 11/16/2018,12:41 PM

## 2018-11-16 NOTE — Progress Notes (Signed)
[facility]    NEPHROLOGY PROGRESS NOTE  SUBJECTIVE: 2.6L urine output yesterday.  K 3.0 this AM.  Working with PT     OBJECTIVE:  Vitals:   11/16/18 0700 11/16/18 0903  BP: 119/73 140/62  Pulse: 89 (!) 105  Resp: 19   Temp:    SpO2: 96%     Intake/Output Summary (Last 24 hours) at 11/16/2018 1150 Last data filed at 11/16/2018 1046 Gross per 24 hour  Intake 1050 ml  Output 2750 ml  Net -1700 ml      General:  AAOx3 NAD, lying in bed working with PT Neck:  No JVD CV:  Heart RRR  Lungs:  Clear bilaterally  Abd:  abd SNT/ND with normal BS Extremities:  Trace LE edema  MEDICATIONS:  . ALPRAZolam  0.25 mg Oral TID  . busPIRone  10 mg Oral BID  . ezetimibe  10 mg Oral QHS  . ferrous sulfate  325 mg Oral Once per day on Sun Mon Wed Fri  . furosemide  20 mg Oral Daily  . gabapentin  600 mg Oral TID  . insulin aspart  0-9 Units Subcutaneous TID WC  . insulin detemir  60 Units Subcutaneous QHS  . magnesium oxide  400 mg Oral TID  . Melatonin  3 mg Oral QHS  . metoprolol succinate  12.5 mg Oral Once  . [START ON 11/17/2018] metoprolol succinate  37.5 mg Oral Daily  . potassium chloride  40 mEq Oral TID  . pramipexole  0.125 mg Oral QHS  . sodium chloride flush  3 mL Intravenous Once  . spironolactone  25 mg Oral Daily  . tiZANidine  4 mg Oral TID  . vitamin C  500 mg Oral Daily  . warfarin  7.5 mg Oral Once per day on Sun Wed   And  . warfarin  5 mg Oral Once per day on Mon Tue Thu Fri Sat  . Warfarin - Pharmacist Dosing Inpatient   Does not apply q1800       LABS:   CBC Latest Ref Rng & Units 11/16/2018 11/14/2018 11/13/2018  WBC 4.0 - 10.5 K/uL 8.9 7.6 11.6(H)  Hemoglobin 12.0 - 15.0 g/dL 11.8(L) 11.0(L) 12.5  Hematocrit 36.0 - 46.0 % 37.8 35.4(L) 40.3  Platelets 150 - 400 K/uL 177 162 195    CMP Latest Ref Rng & Units 11/16/2018 11/15/2018 11/14/2018  Glucose 70 - 99 mg/dL 102(H) 171(H) -  BUN 8 - 23 mg/dL 14 16 -  Creatinine 0.44 - 1.00 mg/dL  0.71 0.74 -  Sodium 135 - 145 mmol/L 141 139 -  Potassium 3.5 - 5.1 mmol/L 3.0(L) 3.5 3.7  Chloride 98 - 111 mmol/L 102 103 -  CO2 22 - 32 mmol/L 28 26 -  Calcium 8.9 - 10.3 mg/dL 8.7(L) 8.7(L) -  Total Protein 6.0 - 8.5 g/dL - - -  Total Bilirubin 0.0 - 1.2 mg/dL - - -  Alkaline Phos 39 - 117 IU/L - - -  AST 0 - 40 IU/L - - -  ALT 0 - 32 IU/L - - -    Lab Results  Component Value Date   CALCIUM 8.7 (L) 11/16/2018   PHOS 3.7 09/07/2017       Component Value Date/Time   COLORURINE YELLOW 11/13/2018 [place] 11/13/2018 1244   LABSPEC 1.013 11/13/2018 1244   PHURINE 6.0 11/13/2018 [place] 11/13/2018 1244   [place] 11/13/2018 1244   BILIRUBINUR NEGATIVE  11/13/2018 [place] 11/13/2018 [place] 11/13/2018 1244   UROBILINOGEN 1.0 09/06/2010 1130   NITRITE NEGATIVE 11/13/2018 1244   LEUKOCYTESUR NEGATIVE 11/13/2018 1244   No results found for: PHART, PCO2ART, PO2ART, HCO3, TCO2, ACIDBASEDEF, O2SAT     Component Value Date/Time   IRON 24 (L) 09/06/2017 0502   TIBC 214 (L) 09/06/2017 0502   FERRITIN 316 (H) 09/06/2017 0502   IRONPCTSAT 11 09/06/2017 0502       ASSESSMENT/PLAN:     1.  Severe hypokalemia.  This is been recurrent.  She takes 120 mEq of potassium daily.  She is on Lasix and metolazone at home for diastolic heart failure.  Her magnesium level has been normal.  Given her markedly low potassium, she would have at least a 300 mEq potassium deficit.  TSH low- normal  No signs or symptoms of an insulinoma.   Denies any GI losses.   Ultimately suspect this is related to longstanding use of loop diuretics.  Would use potassium sparing diuretics.  Given normal potassium levels prior to the use of significant diuretics, no evidence of a genetic syndrome.  Increase supps to 40 TID today given K of 3.0.  Monitor closely as aldactone added back- suspect she will not need TID dosing for long.  2.  Chronic  diastolic congestive heart failure.  As above, will continue furosemide but add spironolactone.  3.  Atrial fibrillation with RVR.  Heart rate improved.  4.  Type 2 diabetes.  Continue current management.

## 2018-11-16 NOTE — Progress Notes (Signed)
Occupational Therapy Evaluation Patient Details Name: Donna [name] MRN: [id number] DOB: 01/27/1951 Today's Date: 11/16/2018    History of Present Illness Pt is a 68 y.o. female admitted 11/12/18 with generalized weakness. Found to be in afib with RVR, severe hypokalemia. PMH includes CHF, permanent afib, fibromyalgia, anxiety, depression, DM2, RLS.   Clinical Impression   PTA, pt was living at home with her husband, and required assistance with ADL/IADL and functional mobility with use of RW/w/c. Pt currently requires modA for bed mobility, maxA+2 for lateral scoot transfer from EOB to drop arm recliner. Pt requires minA for UB ADL and maxA for LB ADL. Due to decline in current level of function, pt would benefit from acute OT to address established goals to facilitate safe D/C to venue listed below. At this time, recommend SNF follow-up. Will continue to follow acutely.     Follow Up Recommendations  SNF    Equipment Recommendations  3 in 1 bedside commode(with drop arm)    Recommendations for Other Services       Precautions / Restrictions Precautions Precautions: Fall Restrictions Weight Bearing Restrictions: Yes RLE Weight Bearing: Weight bearing as tolerated LLE Weight Bearing: Weight bearing as tolerated      Mobility Bed Mobility Overal bed mobility: Needs Assistance Bed Mobility: Supine to Sit     Supine to sit: HOB elevated;Mod assist     General bed mobility comments: ModA to assist hips to EOB, grimacing in pain, cues to use bed rail and assist; anxious due to pain;max vc for sequencing and for pt to engage   Transfers Overall transfer level: Needs assistance   Transfers: Lateral/Scoot Transfers          Lateral/Scoot Transfers: Max assist;+2 physical assistance General transfer comment: lateral scoot from EOB to drop arm recliner;attempted standing 2x to reposition in recliner, maxA+2 to clear buttocks off seat    Balance Overall balance assessment:  Needs assistance Sitting-balance support: No upper extremity supported;Feet supported Sitting balance-Leahy Scale: Fair       Standing balance-Leahy Scale: Zero                             ADL either performed or assessed with clinical judgement   ADL Overall ADL's : Needs assistance/impaired Eating/Feeding: Set up;Sitting   Grooming: Set up;Sitting   Upper Body Bathing: Min guard;Sitting   Lower Body Bathing: Maximal assistance;Sitting/lateral leans   Upper Body Dressing : Minimal assistance;Sitting   Lower Body Dressing: Maximal assistance;Sitting/lateral leans Lower Body Dressing Details (indicate cue type and reason): pt able to don shoes after setup, pt has elastic shoe laces and can slip shoes on, increased time and effort to don shoes Toilet Transfer: Maximal assistance;+2 for physical assistance(lateral scoot) Toilet Transfer Details (indicate cue type and reason): simulated from EOB to drop arm recliner Toileting- Clothing Manipulation and Hygiene: Maximal assistance;+2 for physical assistance;Sitting/lateral lean       Functional mobility during ADLs: Maximal assistance;+2 for physical assistance General ADL Comments: lateral scoot from EOB to drop arm recliner     Vision         Perception     Praxis      Pertinent Vitals/Pain Pain Assessment: Faces Faces Pain Scale: Hurts whole lot Pain Location: sacrum Pain Descriptors / Indicators: Grimacing;Guarding;Crying Pain Intervention(s): Monitored during session;Limited activity within patient's tolerance;Repositioned     Hand Dominance Right   Extremity/Trunk Assessment Upper Extremity Assessment Upper Extremity Assessment: Generalized weakness  Lower Extremity Assessment Lower Extremity Assessment: Generalized weakness       Communication Communication Communication: Expressive difficulties(intermittent stuttering/difficulty finding words)   Cognition Arousal/Alertness:  Awake/alert Behavior During Therapy: Anxious Overall Cognitive Status: No family/caregiver present to determine baseline cognitive functioning Area of Impairment: Attention;Problem solving                   Current Attention Level: Selective         Problem Solving: Requires verbal cues General Comments: Pt reports she has memory difficulty at baseline and reports difficulty with word finding is baseline as well   General Comments  pt reports of sacral wound, covered in foam dressing    Exercises     Shoulder Instructions      Home Living Family/patient expects to be discharged to:: Skilled nursing facility                                 Additional Comments: Lives with husband      Prior Functioning/Environment Level of Independence: Needs assistance  Gait / Transfers Assistance Needed: Limited household ambulation with RW vs use of w/c. Pt with increased difficulty past few weeks, unable to stand, husband assists with pivots from BSC<>chair ADL's / Homemaking Assistance Needed: Has been using purewick at home. Husband assists with ADLs and household tasks            OT Problem List: Decreased activity tolerance;Impaired balance (sitting and/or standing);Decreased safety awareness;Decreased range of motion;Decreased strength;Decreased cognition;Decreased knowledge of precautions;Pain;Obesity      OT Treatment/Interventions: Self-care/ADL training;Therapeutic exercise;Energy conservation;DME and/or AE instruction;Therapeutic activities;Patient/family education;Balance training    OT Goals(Current goals can be found in the care plan section) Acute Rehab OT Goals Patient Stated Goal: Be able to stand and walk again OT Goal Formulation: With patient Time For Goal Achievement: 11/30/18 Potential to Achieve Goals: Good ADL Goals Pt Will Perform Grooming: with modified independence Pt Will Perform Upper Body Dressing: with modified independence Pt  Will Perform Lower Body Dressing: sit to/from stand;with mod assist Pt Will Transfer to Toilet: with mod assist;stand pivot transfer Additional ADL Goal #1: Pt will progress to EOB independently in preparation for ADL.  OT Frequency: Min 2X/week   Barriers to D/C: Decreased caregiver support  pt reports husband is unable to assist her       Co-evaluation PT/OT/SLP Co-Evaluation/Treatment: Yes Reason for Co-Treatment: Complexity of the patient's impairments (multi-system involvement);For patient/therapist safety;To address functional/ADL transfers   OT goals addressed during session: ADL's and self-care      AM-PAC OT "6 Clicks" Daily Activity     Outcome Measure Help from another person eating meals?: None Help from another person taking care of personal grooming?: A Little Help from another person toileting, which includes using toliet, bedpan, or urinal?: A Lot Help from another person bathing (including washing, rinsing, drying)?: A Lot Help from another person to put on and taking off regular upper body clothing?: A Little Help from another person to put on and taking off regular lower body clothing?: A Lot 6 Click Score: 16   End of Session Equipment Utilized During Treatment: Gait belt Nurse Communication: Mobility status  Activity Tolerance: Patient tolerated treatment well Patient left: in chair;with call bell/phone within reach;with chair alarm set  OT Visit Diagnosis: Unsteadiness on feet (R26.81);Other abnormalities of gait and mobility (R26.89);Muscle weakness (generalized) (M62.81);Repeated falls (R29.6);History of falling (Z91.81);Pain Pain - part of body: (sacrum)  Time: 1100-1147 OT Time Calculation (min): 47 min Charges:  OT General Charges $OT Visit: 1 Visit OT Evaluation $OT Eval Moderate Complexity: 1 Mod OT Treatments $Self Care/Home Management : 8-22 mins  Dorinda Hill OTR/L Acute Rehabilitation Services Office:  [phone]   Wyn For[place] 11/16/2018, 1:07 PM

## 2018-11-16 NOTE — Progress Notes (Addendum)
PROGRESS NOTE    Donna [name]  [id number] DOB: 15-Nov-1950 DOA: 11/12/2018 PCP: Janine Limbo, PA-C   Brief Narrative: 68 year old with past medical history significant for A. fib on Coumadin and amiodarone, chronic diastolic heart failure diabetes, diabetic lumbosacral plexopathy, OSA on CPAP, restless leg syndrome who presents with generalized weakness.  She report frequent fall at home and most of the time she sits on the chair.  She has been taking Lasix and metolazone, recently stopped taking the spironolactone as advised by her cardiology.  She presented with severe hypokalemia and EKG with prolonged QT.  She was in A. fib RVR.  She was a started on Cardizem drip and supplemental potassium.  Heart rate is controlled she is off of Cardizem.  She was a started on low-dose Toprol.  Will require skilled nursing facility. I have informed cardiology of her admission, they are actively managing her medications as an outpatient. We will continue to replete her potassium.  Her blood pressure has improved with lower dose of diuretics.  Plan to increase metoprolol today to help with A. fib rate control.      Assessment & Plan:   Principal Problem:   Hypokalemia Active Problems:   Chronic diastolic CHF (congestive heart failure) (HCC)   Depression   Atrial fibrillation with RVR (HCC)   Type 2 diabetes mellitus (HCC)   Chronic atrial fibrillation with RVR   Leukocytosis   Orthostatic hypotension    1-Severe hypokalemia: She takes 120 potassium every day. Likely related to diuretics.\ Continue with potassium supplementation BID today. She is now on spironolactone.  K decreased to 3.0.  KCl supplement changed to 3 times daily.   2-A.Fib  RVR, chronic: She was not on rate control medications due to blood pressure issues.  She was treated initially with IV Cardizem drip, subsequently stopped but.  She was a started on low-dose Toprol. She was previously taking amiodarone but it was  discontinued by her provider. Appreciate cardiology help. Plan to increase Toprol to 37 twice daily.  Blood pressure has increased.  3-Chronic diastolic heart failure; venous insufficiency Cardiology adjusted medications.  Metolazone stopped due to hypotension.  Lasix dose reduced to 20 mg daily.  TED hose ordered  4-Prolonged QT; improved after potassium supplementation. 5-Anxiety depression: Continue with buspirone and Xanax.  6-Hyperlipidemia: Continue with Zetia  7-History of restless leg syndrome: Continue with pramipexole  8-Diabetes type 2: Continue with insulin.  9-Frequent fall: She will need PT OT will probably require placement.  10-pelvic pain: We will get x-ray.  Unstageable sacrum pressure injury: Likely present on admission. Local care  Pressure Injury 09/29/17 Unstageable - Full thickness tissue loss in which the base of the ulcer is covered by slough (yellow, tan, gray, green or brown) and/or eschar (tan, brown or black) in the wound bed. (Active)  09/29/17 1426  Location: Sacrum  Location Orientation: Mid  Staging: Unstageable - Full thickness tissue loss in which the base of the ulcer is covered by slough (yellow, tan, gray, green or brown) and/or eschar (tan, brown or black) in the wound bed.  Wound Description (Comments):   Present on Admission:     Estimated body mass index is 36.31 kg/m as calculated from the following:   Height as of this encounter: 5\' 11"  (1.803 m).   Weight as of this encounter: 118.1 kg.   DVT prophylaxis: Coumadin Code Status: Full code Family Communication: Care discussed with patient Disposition Plan: Monitor blood pressure, cardiology consultation, PT skilled placement Consultants:  Cardiology  Procedures:   None  Antimicrobials:  None  Subjective: She is complaining of hip pain, she fell last August.  She is breathing okay.  Objective: Vitals:   11/16/18 0300 11/16/18 0335 11/16/18 0700 11/16/18 0903  BP: (!)  106/51 108/62 119/73 140/62  Pulse: 78 (!) 55 89 (!) 105  Resp: (!) 38 (!) 21 19   Temp:  98.3 F (36.8 C)    TempSrc:  Oral    SpO2: 97% 96% 96%   Weight:      Height:        Intake/Output Summary (Last 24 hours) at 11/16/2018 1146 Last data filed at 11/16/2018 1046 Gross per 24 hour  Intake 1050 ml  Output 2750 ml  Net -1700 ml   Filed Weights   11/12/18 1727 11/13/18 0614  Weight: 117.9 kg 118.1 kg    Examination:  General exam: No acute distress Respiratory system: Clear to auscultation Cardiovascular system: S1, S2 irregular rhythm and rate Gastrointestinal system: Bowel sounds present, soft nontender nondistended Central nervous system: Nonfocal Extremities: Symmetric power   Data Reviewed: I have personally reviewed following labs and imaging studies  CBC: Recent Labs  Lab 11/12/18 1739 11/13/18 0459 11/14/18 0221 11/16/18 0752  WBC 13.4* 11.6* 7.6 8.9  HGB 14.7 12.5 11.0* 11.8*  HCT 47.6* 40.3 35.4* 37.8  MCV 81.2 80.9 81.2 83.8  PLT 259 195 162 [id number]   Basic Metabolic Panel: Recent Labs  Lab 11/12/18 1739 11/13/18 0459  11/13/18 2329 11/14/18 0024 11/14/18 1441 11/15/18 0438 11/16/18 0752  NA 136 134*  --   --  135  --  139 141  K <2.0* 2.2*   < > 3.0* 2.9* 3.7 3.5 3.0*  CL 87* 93*  --   --  97*  --  103 102  CO2 30 27  --   --  27  --  26 28  GLUCOSE 345* 314*  --   --  353*  --  171* 102*  BUN 17 20  --   --  17  --  16 14  CREATININE 1.14* 0.99  --   --  0.91  --  0.74 0.71  CALCIUM 9.7 8.6*  --   --  8.4*  --  8.7* 8.7*  MG  --  2.1  --   --   --   --   --   --    < > = values in this interval not displayed.   GFR: Estimated Creatinine Clearance: 96.6 mL/min (by C-G formula based on SCr of 0.71 mg/dL). Liver Function Tests: No results for input(s): AST, ALT, ALKPHOS, BILITOT, PROT, ALBUMIN in the last 168 hours. No results for input(s): LIPASE, AMYLASE in the last 168 hours. No results for input(s): AMMONIA in the last 168 hours.  Coagulation Profile: Recent Labs  Lab 11/12/18 1739 11/14/18 0024 11/15/18 0438 11/16/18 0356  INR 2.4* 2.2* 2.2* 2.1*   Cardiac Enzymes: No results for input(s): CKTOTAL, CKMB, CKMBINDEX, TROPONINI in the last 168 hours. BNP (last 3 results) No results for input(s): PROBNP in the last 8760 hours. HbA1C: No results for input(s): HGBA1C in the last 72 hours. CBG: Recent Labs  Lab 11/15/18 0607 11/15/18 1119 11/15/18 1612 11/15/18 2053 11/16/18 0626  GLUCAP 140* 155* 175* 244* 107*   Lipid Profile: No results for input(s): CHOL, HDL, LDLCALC, TRIG, CHOLHDL, LDLDIRECT in the last 72 hours. Thyroid Function Tests: Recent Labs    11/15/18 0438  TSH 0.434  Anemia Panel: No results for input(s): VITAMINB12, FOLATE, FERRITIN, TIBC, IRON, RETICCTPCT in the last 72 hours. Sepsis Labs: No results for input(s): PROCALCITON, LATICACIDVEN in the last 168 hours.  Recent Results (from the past 240 hour(s))  SARS Coronavirus 2 [facility] order, Performed in [facility] hospital lab) Nasopharyngeal Nasopharyngeal Swab     Status: None   Collection Time: 11/12/18 11:42 PM   Specimen: Nasopharyngeal Swab  Result Value Ref Range Status   SARS Coronavirus 2 NEGATIVE NEGATIVE Final    Comment: (NOTE) If result is NEGATIVE SARS-CoV-2 target nucleic acids are NOT DETECTED. The SARS-CoV-2 RNA is generally detectable in upper and lower  respiratory specimens during the acute phase of infection. The lowest  concentration of SARS-CoV-2 viral copies this assay can detect is 250  copies / mL. A negative result does not preclude SARS-CoV-2 infection  and should not be used as the sole basis for treatment or other  patient management decisions.  A negative result may occur with  improper specimen collection / handling, submission of specimen other  than nasopharyngeal swab, presence of viral mutation(s) within the  areas targeted by this assay, and inadequate number of viral copies  (<250  copies / mL). A negative result must be combined with clinical  observations, patient history, and epidemiological information. If result is POSITIVE SARS-CoV-2 target nucleic acids are DETECTED. The SARS-CoV-2 RNA is generally detectable in upper and lower  respiratory specimens dur ing the acute phase of infection.  Positive  results are indicative of active infection with SARS-CoV-2.  Clinical  correlation with patient history and other diagnostic information is  necessary to determine patient infection status.  Positive results do  not rule out bacterial infection or co-infection with other viruses. If result is PRESUMPTIVE POSTIVE SARS-CoV-2 nucleic acids MAY BE PRESENT.   A presumptive positive result was obtained on the submitted specimen  and confirmed on repeat testing.  While 2019 novel coronavirus  (SARS-CoV-2) nucleic acids may be present in the submitted sample  additional confirmatory testing may be necessary for epidemiological  and / or clinical management purposes  to differentiate between  SARS-CoV-2 and other Sarbecovirus currently known to infect humans.  If clinically indicated additional testing with an alternate test  methodology [id number]) is advised. The SARS-CoV-2 RNA is generally  detectable in upper and lower respiratory sp ecimens during the acute  phase of infection. The expected result is Negative. Fact Sheet for Patients:  StrictlyIdeas.no Fact Sheet for Healthcare Providers: BankingDealers.co.za This test is not yet approved or cleared by the [place] FDA and has been authorized for detection and/or diagnosis of SARS-CoV-2 by FDA under an Emergency Use Authorization (EUA).  This EUA will remain in effect (meaning this test can be used) for the duration of the COVID-19 declaration under Section 564(b)(1) of the Act, 21 U.S.C. section 360bbb-3(b)(1), unless the authorization is terminated or revoked  sooner. Performed at [facility], [place] [address] [place], [place] [address]          Radiology Studies: No results found.      Scheduled Meds: . ALPRAZolam  0.25 mg Oral TID  . busPIRone  10 mg Oral BID  . ezetimibe  10 mg Oral QHS  . ferrous sulfate  325 mg Oral Once per day on Sun Mon Wed Fri  . furosemide  20 mg Oral Daily  . gabapentin  600 mg Oral TID  . insulin aspart  0-9 Units Subcutaneous TID WC  . insulin detemir  60 Units Subcutaneous QHS  . magnesium oxide  400 mg Oral TID  . Melatonin  3 mg Oral QHS  . metoprolol succinate  12.5 mg Oral Once  . [START ON 11/17/2018] metoprolol succinate  37.5 mg Oral Daily  . potassium chloride  40 mEq Oral TID  . pramipexole  0.125 mg Oral QHS  . sodium chloride flush  3 mL Intravenous Once  . spironolactone  25 mg Oral Daily  . tiZANidine  4 mg Oral TID  . vitamin C  500 mg Oral Daily  . warfarin  7.5 mg Oral Once per day on Sun Wed   And  . warfarin  5 mg Oral Once per day on Mon Tue Thu Fri Sat  . Warfarin - Pharmacist Dosing Inpatient   Does not apply q1800   Continuous Infusions:   LOS: 3 days    Time spent:35 minutes.     Elmarie Shiley, MD [facility] Pager [phone]  If 7PM-7AM, please contact night-coverage [email or web address] Password [facility] 11/16/2018, 11:46 AM

## 2018-11-16 NOTE — Progress Notes (Signed)
Progress Note  Patient Name: Donna [name] Date of Encounter: 11/16/2018  Primary Cardiologist: Glenetta Hew, MD   Subjective   Feeling better today. HR remains elevated but BP more stable. Denies pain or SOB   Inpatient Medications    Scheduled Meds: . ALPRAZolam  0.25 mg Oral TID  . busPIRone  10 mg Oral BID  . ezetimibe  10 mg Oral QHS  . ferrous sulfate  325 mg Oral Once per day on Sun Mon Wed Fri  . furosemide  20 mg Oral Daily  . gabapentin  600 mg Oral TID  . insulin aspart  0-9 Units Subcutaneous TID WC  . insulin detemir  60 Units Subcutaneous QHS  . magnesium oxide  400 mg Oral TID  . Melatonin  3 mg Oral QHS  . metoprolol succinate  25 mg Oral Daily  . potassium chloride  40 mEq Oral BID  . pramipexole  0.125 mg Oral QHS  . sodium chloride flush  3 mL Intravenous Once  . spironolactone  25 mg Oral Daily  . tiZANidine  4 mg Oral TID  . vitamin C  500 mg Oral Daily  . warfarin  7.5 mg Oral Once per day on Sun Wed   And  . warfarin  5 mg Oral Once per day on Mon Tue Thu Fri Sat  . Warfarin - Pharmacist Dosing Inpatient   Does not apply q1800   Continuous Infusions:  PRN Meds: acetaminophen, docusate sodium, oxyCODONE   Vital Signs    Vitals:   11/16/18 0200 11/16/18 0300 11/16/18 0335 11/16/18 0903  BP: (!) 101/53 (!) 106/51 108/62 140/62  Pulse: 70 78 (!) 55 (!) 105  Resp: 18 (!) 38 (!) 21   Temp:   98.3 F (36.8 C)   TempSrc:   Oral   SpO2: 94% 97% 96%   Weight:      Height:        Intake/Output Summary (Last 24 hours) at 11/16/2018 1042 Last data filed at 11/16/2018 0400 Gross per 24 hour  Intake 850 ml  Output 2650 ml  Net -1800 ml   Filed Weights   11/12/18 1727 11/13/18 0614  Weight: 117.9 kg 118.1 kg    Physical Exam   General: Ill appearing, NAD Neck: Negative for carotid bruits. No JVD Lungs:Clear to ausculation bilaterally. No wheezes Cardiovascular: Irregularly irregular with S1 S2. No murmur Abdomen: Soft, non-tender,  non-distended. No obvious abdominal masses. Extremities:No edema. No clubbing or cyanosis. DP pulses 1+ bilaterally Neuro: Alert and oriented. No focal deficits. No facial asymmetry. MAE spontaneously. Psych: Responds to questions appropriately with normal affect.    Labs    Chemistry Recent Labs  Lab 11/14/18 0024 11/14/18 1441 11/15/18 0438 11/16/18 0752  NA 135  --  139 141  K 2.9* 3.7 3.5 3.0*  CL 97*  --  103 102  CO2 27  --  26 28  GLUCOSE 353*  --  171* 102*  BUN 17  --  16 14  CREATININE 0.91  --  0.74 0.71  CALCIUM 8.4*  --  8.7* 8.7*  GFRNONAA >60  --  >60 >60  GFRAA >60  --  >60 >60  ANIONGAP 11  --  10 11     Hematology Recent Labs  Lab 11/13/18 0459 11/14/18 0221 11/16/18 0752  WBC 11.6* 7.6 8.9  RBC 4.98 4.36 4.51  HGB 12.5 11.0* 11.8*  HCT 40.3 35.4* 37.8  MCV 80.9 81.2 83.8  MCH  25.1* 25.2* 26.2  MCHC 31.0 31.1 31.2  RDW 17.2* 17.2* 18.1*  PLT 195 162 177    Cardiac EnzymesNo results for input(s): TROPONINI in the last 168 hours. No results for input(s): TROPIPOC in the last 168 hours.   BNPNo results for input(s): BNP, PROBNP in the last 168 hours.   DDimer No results for input(s): DDIMER in the last 168 hours.   Radiology    No results found.  Telemetry    11/16/2018 AF with HR in the 90-100's  - Personally Reviewed  ECG    No new tracing as of 11/16/2018. Last EKG with AF HR 126 with prolonged QTC- Personally Reviewed  Cardiac Studies   TTE 09/03/2017 - Left ventricle: The cavity size was normal. Wall thickness was increased in a pattern of mild LVH. Systolic function was normal. The estimated ejection fraction was in the range of 55% to 60%. Images were inadequate for LV wall motion assessment. Indeterminate diastolic function. - Aortic valve: Mildly to moderately calcified annulus. Trileaflet; mildly calcified leaflets. There was very mild stenosis. Mean gradient (S): 9 mm Hg. Peak gradient (S): 16 mm Hg. VTI  ratio of LVOT to aortic valve: 0.48. Valve area (VTI): 1.83 cm^2. Valve area (Vmax): 1.76 cm^2. Valve area (Vmean): 2 cm^2. - Mitral valve: Moderately calcified annulus. There was trivial regurgitation. - Left atrium: The atrium was moderately dilated. - Right atrium: Central venous pressure (est): 3 mm Hg. - Atrial septum: No defect or patent foramen ovale was identified. - Tricuspid valve: There was trivial regurgitation. - Pulmonary arteries: Systolic pressure could not be accurately estimated. - Pericardium, extracardiac: There was no pericardial effusion.  Patient Profile     68 y.o. female with a hx of permanent atrial fibrillation on warfarin and amiodarone, OSA, on CPAP, DM, and HTN who is being seen today for the evaluation of her permanent atrial fibrillation.  Assessment & Plan    1. Permanent atrial fibrillation: -Has known permanent AF on AC with warfarin and rhythm control with amiodarone who was admitted for generalized weakness in the setting of profound hypokalemia. She was previously rate controlled on metoprolol and amiodarone however BB discontinued secondary to hypotension and amio and spiro d/c due to tremors and dysuria.  -Continue warfarin, no signs of acute bleeding  -INR, 2.1 today>>dosing per pharmacy  -Will attempt to increase Toprol today to 37.5 given stabilizing BP and elevated HR   2. Hypokalemia: -Likely in the setting of over diuresis>>improved today at 3.0 -Nephrology consulted>>states that she was admitted with at least a 379meq deficit  -Would continue to hold metolazone>>continue low dose Lasix at 20mg  QD -Euvolemic on exam today  -CXR with no evidence of CHF  -Management per primary team   3. Hypotension: -Resolving, 140/62>119/73>108/62>106/51 -Continue Toprol XL 25, spiro 25 -Attempt to increase Toprol to 37.5 in the setting of elevated rates   Signed, Kathyrn Drown NP-C [facility] Pager: [phone] 11/16/2018, 10:42 AM      For questions or updates, please contact   Please consult [email or web address] for contact info under Cardiology/STEMI.

## 2018-11-16 NOTE — Progress Notes (Signed)
I concerned that Pt had several episodes of low BP especially at night. I hold her medications that has side effect of hypotension tonight such as Zanaflex, Xanax and Mirapex. Closely observed her BP tonight was more stable 96/70- 108/62 mmHg. Please MD and pharmacist evaluate her medications that could be the cause of  hypotension due to Pt had several falls at home in the past 6 months.  Kennyth Lose, RN

## 2018-11-16 NOTE — Care Management Important Message (Signed)
Important Message  Patient Details  Name: Donna [name] MRN: [id number] Date of Birth: Nov 20, 1950   Medicare Important Message Given:  Yes     [name] 11/16/2018, 11:15 AM

## 2018-11-17 ENCOUNTER — Encounter (HOSPITAL_COMMUNITY): Payer: Self-pay | Admitting: General Practice

## 2018-11-17 LAB — BASIC METABOLIC PANEL
Anion gap: 8 (ref 5–15)
BUN: 17 mg/dL (ref 8–23)
CO2: 24 mmol/L (ref 22–32)
Calcium: 8.7 mg/dL — ABNORMAL LOW (ref 8.9–10.3)
Chloride: 105 mmol/L (ref 98–111)
Creatinine, Ser: 0.7 mg/dL (ref 0.44–1.00)
GFR calc Af Amer: >60 mL/min (ref >60)
GFR calc non Af Amer: >60 mL/min (ref >60)
Glucose, Bld: 202 mg/dL — ABNORMAL HIGH (ref 70–99)
Potassium: 4.3 mmol/L (ref 3.5–5.1)
Sodium: 137 mmol/L (ref 135–145)

## 2018-11-17 LAB — CBC
HCT: 33.8 % — ABNORMAL LOW (ref 36.0–46.0)
Hemoglobin: 10.1 g/dL — ABNORMAL LOW (ref 12.0–15.0)
MCH: 25.3 pg — ABNORMAL LOW (ref 26.0–34.0)
MCHC: 29.9 g/dL — ABNORMAL LOW (ref 30.0–36.0)
MCV: 84.5 fL (ref 80.0–100.0)
Platelets: 152 10*3/uL (ref 150–400)
RBC: 4 MIL/uL (ref 3.87–5.11)
RDW: 18.1 % — ABNORMAL HIGH (ref 11.5–15.5)
WBC: 6.4 10*3/uL (ref 4.0–10.5)
nRBC: 0 % (ref 0.0–0.2)

## 2018-11-17 LAB — URINALYSIS, ROUTINE W REFLEX MICROSCOPIC
Bilirubin Urine: NEGATIVE
Glucose, UA: NEGATIVE mg/dL
Hgb urine dipstick: NEGATIVE
Ketones, ur: NEGATIVE mg/dL
Nitrite: NEGATIVE
Protein, ur: NEGATIVE mg/dL
Specific Gravity, Urine: 1.012 (ref 1.005–1.030)
pH: 5 (ref 5.0–8.0)

## 2018-11-17 LAB — GLUCOSE, CAPILLARY
Glucose-Capillary: 160 mg/dL — ABNORMAL HIGH (ref 70–99)
Glucose-Capillary: 195 mg/dL — ABNORMAL HIGH (ref 70–99)
Glucose-Capillary: 209 mg/dL — ABNORMAL HIGH (ref 70–99)

## 2018-11-17 LAB — PROTIME-INR
INR: 2 — ABNORMAL HIGH (ref 0.8–1.2)
Prothrombin Time: 22.2 seconds — ABNORMAL HIGH (ref 11.4–15.2)

## 2018-11-17 MED ORDER — SPIRONOLACTONE 12.5 MG HALF TABLET
12.5000 mg | ORAL_TABLET | Freq: Every day | ORAL | Status: DC
  Administered 2018-11-17 – 2018-11-19 (×3): 12.5 mg via ORAL
  Filled 2018-11-17 (×3): qty 1

## 2018-11-17 MED ORDER — GABAPENTIN 300 MG PO CAPS
300.0000 mg | ORAL_CAPSULE | Freq: Three times a day (TID) | ORAL | Status: DC
  Administered 2018-11-17 – 2018-11-19 (×6): 300 mg via ORAL
  Filled 2018-11-17 (×6): qty 1

## 2018-11-17 MED ORDER — INFLUENZA VAC A&B SA ADJ QUAD 0.5 ML IM PRSY
0.5000 mL | PREFILLED_SYRINGE | INTRAMUSCULAR | Status: AC
  Administered 2018-11-18: 0.5 mL via INTRAMUSCULAR
  Filled 2018-11-17: qty 0.5

## 2018-11-17 NOTE — Progress Notes (Signed)
ANTICOAGULATION CONSULT NOTE - Follow Up Consult  Pharmacy Consult for Warfarin Indication: atrial fibrillation  Allergies  Allergen Reactions  . Bee Venom Hives    Takes benadryl  . Other Other (See Comments)    Raw foods with seeds give her "boils".  Avoids raw strawberries, blueberries. Tolerates cooked fruits, tomato sauce, bread/grains with seeds. [facility] 10/17/13 Berries with seeds Allergy to glutamine-C-quercet-selen-brom per [facility] 09/25/17  . Alteplase Rash  . Cefepime Rash  . Fentanyl Other (See Comments)    Hallucinations, syncope    . Metformin And Related Diarrhea    Patient Measurements: Height: 5\' 11"  (180.3 cm) Weight: 260 lb 5.8 oz (118.1 kg) IBW/kg (Calculated) : 70.8  Vital Signs: Temp: 97.8 F (36.6 C) (09/16 0253) Temp Source: Oral (09/16 0253) BP: 103/65 (09/16 0253)  Labs: Recent Labs    11/15/18 0438 11/16/18 0356 11/16/18 0752 11/17/18 0240  HGB  --   --  11.8* 10.1*  HCT  --   --  37.8 33.8*  PLT  --   --  177 152  LABPROT 24.3* 23.6*  --  22.2*  INR 2.2* 2.1*  --  2.0*  CREATININE 0.74  --  0.71 0.70    Estimated Creatinine Clearance: 96.6 mL/min (by C-G formula based on SCr of 0.7 mg/dL).   Assessment:   Anticoag: Warfarin PTA for afib. INR 2. Hgb 11.8>>10.1. Plts WNL -Coumadin 5 mg daily except 7.5 mg SunWed PTA. Pt's INR on admit was 2.4  Goal of Therapy:  INR 2-3 Monitor platelets by anticoagulation protocol: Yes   Plan:  Coumadin 5 mg daily except 7.5 mg SunWed  Daily INR  Nigel Ericsson S. Alford Highland, PharmD, [place] Clinical Staff Pharmacist Eilene Ghazi Stillinger 11/17/2018,10:39 AM

## 2018-11-17 NOTE — Progress Notes (Signed)
[facility]    NEPHROLOGY PROGRESS NOTE  SUBJECTIVE: K better at 4.3 this AM.  Sleeping and arousable, reports some burning on urination.  Seems a little confused today.  OBJECTIVE:  Vitals:   11/17/18 0253 11/17/18 1049  BP: 103/65 (!) 151/95  Pulse:  99  Resp: (!) 21 20  Temp: 97.8 F (36.6 C) 99.4 F (37.4 C)  SpO2: 95% 95%    Intake/Output Summary (Last 24 hours) at 11/17/2018 1416 Last data filed at 11/17/2018 0840 Gross per 24 hour  Intake 440 ml  Output 400 ml  Net 40 ml      General:  Sleeping, easily arousable Neck:  No JVD CV:  Heart RRR  Lungs:  Clear bilaterally  Abd:  abd SNT/ND with normal BS Extremities:  Trace LE edema  MEDICATIONS:  . ALPRAZolam  0.25 mg Oral TID  . busPIRone  10 mg Oral BID  . ezetimibe  10 mg Oral QHS  . ferrous sulfate  325 mg Oral Once per day on Sun Mon Wed Fri  . furosemide  20 mg Oral Daily  . gabapentin  600 mg Oral TID  . [START ON 11/18/2018] influenza vaccine adjuvanted  0.5 mL Intramuscular Tomorrow-1000  . insulin aspart  0-9 Units Subcutaneous TID WC  . insulin detemir  60 Units Subcutaneous QHS  . magnesium oxide  400 mg Oral TID  . Melatonin  3 mg Oral QHS  . metoprolol succinate  37.5 mg Oral Daily  . potassium chloride  40 mEq Oral TID  . pramipexole  0.125 mg Oral QHS  . sodium chloride flush  3 mL Intravenous Once  . spironolactone  12.5 mg Oral Daily  . tiZANidine  4 mg Oral TID  . vitamin C  500 mg Oral Daily  . warfarin  7.5 mg Oral Once per day on Sun Wed   And  . warfarin  5 mg Oral Once per day on Mon Tue Thu Fri Sat  . Warfarin - Pharmacist Dosing Inpatient   Does not apply q1800       LABS:   CBC Latest Ref Rng & Units 11/17/2018 11/16/2018 11/14/2018  WBC 4.0 - 10.5 K/uL 6.4 8.9 7.6  Hemoglobin 12.0 - 15.0 g/dL 10.1(L) 11.8(L) 11.0(L)  Hematocrit 36.0 - 46.0 % 33.8(L) 37.8 35.4(L)  Platelets 150 - 400 K/uL 152 177 162    CMP Latest Ref Rng & Units 11/17/2018 11/16/2018  11/15/2018  Glucose 70 - 99 mg/dL 202(H) 102(H) 171(H)  BUN 8 - 23 mg/dL 17 14 16   Creatinine 0.44 - 1.00 mg/dL 0.70 0.71 0.74  Sodium 135 - 145 mmol/L 137 141 139  Potassium 3.5 - 5.1 mmol/L 4.3 3.0(L) 3.5  Chloride 98 - 111 mmol/L 105 102 103  CO2 22 - 32 mmol/L 24 28 26   Calcium 8.9 - 10.3 mg/dL 8.7(L) 8.7(L) 8.7(L)  Total Protein 6.0 - 8.5 g/dL - - -  Total Bilirubin 0.0 - 1.2 mg/dL - - -  Alkaline Phos 39 - 117 IU/L - - -  AST 0 - 40 IU/L - - -  ALT 0 - 32 IU/L - - -    Lab Results  Component Value Date   CALCIUM 8.7 (L) 11/17/2018   PHOS 3.7 09/07/2017       Component Value Date/Time   COLORURINE YELLOW 11/13/2018 [place] 11/13/2018 1244   LABSPEC 1.013 11/13/2018 [place] 6.0 11/13/2018 [place] 11/13/2018 1244  HGBUR NEGATIVE 11/13/2018 [place] 11/13/2018 [place] 11/13/2018 1244   PROTEINUR NEGATIVE 11/13/2018 1244   UROBILINOGEN 1.0 09/06/2010 1130   NITRITE NEGATIVE 11/13/2018 1244   LEUKOCYTESUR NEGATIVE 11/13/2018 1244   No results found for: PHART, PCO2ART, PO2ART, HCO3, TCO2, ACIDBASEDEF, O2SAT     Component Value Date/Time   IRON 24 (L) 09/06/2017 0502   TIBC 214 (L) 09/06/2017 0502   FERRITIN 316 (H) 09/06/2017 0502   IRONPCTSAT 11 09/06/2017 0502       ASSESSMENT/PLAN:     1.  Severe hypokalemia.  This is been recurrent.  She takes 120 mEq of potassium daily.  She is on Lasix and metolazone at home for diastolic heart failure.  Her magnesium level has been normal.  Given her markedly low potassium, she would have at least a 300 mEq potassium deficit.  TSH low- normal  No signs or symptoms of an insulinoma.   Denies any GI losses.   Ultimately suspect this is related to longstanding use of loop diuretics.  Would use potassium sparing diuretics.  Given normal potassium levels prior to the use of significant diuretics, no evidence of a genetic syndrome.  Increased supps to 40 TID  9/15 given K of 3.0. K 3.0--4.3, was decreased today to 12.5 daily.    2.  Chronic diastolic congestive heart failure.  As above, will continue furosemide with addition of spironolactone.  3.  Atrial fibrillation with RVR.  Heart rate improved.  4.  Type 2 diabetes.  Continue current management.  5.  Slight confusion- decrease gabapentin to 300 TID and re-send UA + culture esp with reports of dysuria

## 2018-11-17 NOTE — Progress Notes (Signed)
PROGRESS NOTE    Donna [name]  WUJ:[id number] DOB: 1950-07-01 DOA: 11/12/2018 PCP: Janine Limbo, PA-C   Brief Narrative: 68 year old with past medical history significant for A. fib on Coumadin and amiodarone, chronic diastolic heart failure diabetes, diabetic lumbosacral plexopathy, OSA on CPAP, restless leg syndrome who presented with generalized weakness.  She report frequent fall at home and most of the time she sits on the chair.  She has been taking Lasix and metolazone, recently stopped taking the spironolactone as advised by her cardiology.  She presented with severe hypokalemia and EKG with prolonged QT.  She was in A. fib RVR.  She was a started on Cardizem drip and supplemental potassium.  Heart rate is controlled she is off of Cardizem.  She was a started on low-dose Toprol.   -In addition has been struggling with left leg weakness, generalized weakness for 6 months to a year, has seen her neurosurgeon and an orthopedic surgeon for this, MRI of thoracic spine noted severe DJD, but not felt to explain her neurological symptoms, neurology follow-up recommended for this for nerve conduction studies . -PT OT evaluation completed, SNF recommended    Assessment & Plan:   1-Severe hypokalemia: She takes 120 potassium every day. -From long-term loop diuretic therapy -Potassium improved on larger replacement doses, Aldactone -4.3 today, will need lower dose of KCL replacement, defer to nephrology who was titrating this -B met daily, anticipate this was contributing to her worsening weakness, gait and functional status  2-A.Fib  RVR, chronic: She was not on rate control medications due to blood pressure issues.  She was treated initially with IV Cardizem drip, subsequently stopped, started on low-dose Toprol. She was previously taking amiodarone but it was discontinued by her provider. -Heart rate improved, currently on Toprol XL 37.5 mg daily  3-Chronic diastolic heart failure; venous  insufficiency Cardiology adjusted medications.  Metolazone stopped due to hypotension. -Lasix restarted 20 mg daily and Aldactone 12.5 mg daily -Per cardiology, appears close to euvolemic  4-Prolonged QT; improved after potassium supplementation.  5-Anxiety depression: Continue with buspirone and Xanax.  6-Hyperlipidemia: Continue with Zetia  7-History of restless leg syndrome: Continue with pramipexole  8-Diabetes type 2: Continue with insulin.  9-Frequent fall: She will need PT OT will probably require placement.  10-chronic low back pain, chronic left leg and generalized weakness -Has been followed by orthopedic surgery at [facility] for this and recently saw a neurosurgeon, had MRI done which noted severe DJD at L4-L5 and L5-S1 but could not explain her chronic left leg weakness, he referred her to a neurologist in [place], she is due to follow-up soon, needs nerve conduction studies. -In addition also has peripheral neuropathy from longstanding diabetes which is contributing to her gait problems as well -Plan for short-term rehab now  -Home regimen of oxycodone and gabapentin continued  Pressure Injury 09/29/17 Unstageable - Full thickness tissue loss in which the base of the ulcer is covered by slough (yellow, tan, gray, green or brown) and/or eschar (tan, brown or black) in the wound bed. (Active)  09/29/17 1426  Location: Sacrum  Location Orientation: Mid  Staging: Unstageable - Full thickness tissue loss in which the base of the ulcer is covered by slough (yellow, tan, gray, green or brown) and/or eschar (tan, brown or black) in the wound bed.  Wound Description (Comments):   Present on Admission:     Estimated body mass index is 36.31 kg/m as calculated from the following:   Height as of this encounter:  $'5\' 11"'e$  (1.803 m).   Weight as of this encounter: 118.1 kg.   DVT prophylaxis: Coumadin Code Status: Full code Family Communication: Care discussed with patient and  spouse Disposition Plan: SNF tomorrow if stable  Consultants:   Cardiology  Renal  Procedures:   None  Antimicrobials:  None  Subjective: She is complaining of hip pain, she fell last August.  She is breathing okay.  Objective: Vitals:   11/16/18 2100 11/16/18 2200 11/17/18 0253 11/17/18 1049  BP: (!) 92/55 (!) 90/56 103/65 (!) 151/95  Pulse:    99  Resp: (!) 25 20 (!) 21 20  Temp:   97.8 F (36.6 C) 99.4 F (37.4 C)  TempSrc:   Oral Oral  SpO2:   95% 95%  Weight:      Height:        Intake/Output Summary (Last 24 hours) at 11/17/2018 1312 Last data filed at 11/17/2018 0840 Gross per 24 hour  Intake 440 ml  Output 400 ml  Net 40 ml   Filed Weights   11/12/18 1727 11/13/18 0614  Weight: 117.9 kg 118.1 kg    Examination:  General exam: No acute distress Respiratory system: Clear to auscultation Cardiovascular system: S1, S2 irregular rhythm and rate Gastrointestinal system: Bowel sounds present, soft nontender nondistended Central nervous system: Nonfocal Extremities: Symmetric power   Data Reviewed: I have personally reviewed following labs and imaging studies  CBC: Recent Labs  Lab 11/12/18 1739 11/13/18 0459 11/14/18 0221 11/16/18 0752 11/17/18 0240  WBC 13.4* 11.6* 7.6 8.9 6.4  HGB 14.7 12.5 11.0* 11.8* 10.1*  HCT 47.6* 40.3 35.4* 37.8 33.8*  MCV 81.2 80.9 81.2 83.8 84.5  PLT 259 195 162 177 740   Basic Metabolic Panel: Recent Labs  Lab 11/13/18 0459  11/14/18 0024 11/14/18 1441 11/15/18 0438 11/16/18 0752 11/17/18 0240  NA 134*  --  135  --  139 141 137  K 2.2*   < > 2.9* 3.7 3.5 3.0* 4.3  CL 93*  --  97*  --  103 102 105  CO2 27  --  27  --  '26 28 24  '$ GLUCOSE 314*  --  353*  --  171* 102* 202*  BUN 20  --  17  --  '16 14 17  '$ CREATININE 0.99  --  0.91  --  0.74 0.71 0.70  CALCIUM 8.6*  --  8.4*  --  8.7* 8.7* 8.7*  MG 2.1  --   --   --   --   --   --    < > = values in this interval not displayed.   GFR: Estimated Creatinine  Clearance: 96.6 mL/min (by C-G formula based on SCr of 0.7 mg/dL). Liver Function Tests: No results for input(s): AST, ALT, ALKPHOS, BILITOT, PROT, ALBUMIN in the last 168 hours. No results for input(s): LIPASE, AMYLASE in the last 168 hours. No results for input(s): AMMONIA in the last 168 hours. Coagulation Profile: Recent Labs  Lab 11/12/18 1739 11/14/18 0024 11/15/18 0438 11/16/18 0356 11/17/18 0240  INR 2.4* 2.2* 2.2* 2.1* 2.0*   Cardiac Enzymes: No results for input(s): CKTOTAL, CKMB, CKMBINDEX, TROPONINI in the last 168 hours. BNP (last 3 results) No results for input(s): PROBNP in the last 8760 hours. HbA1C: No results for input(s): HGBA1C in the last 72 hours. CBG: Recent Labs  Lab 11/16/18 1146 11/16/18 1644 11/16/18 2135 11/17/18 0622 11/17/18 1049  GLUCAP 150* 262* 211* 160* 195*   Lipid Profile: No results  for input(s): CHOL, HDL, LDLCALC, TRIG, CHOLHDL, LDLDIRECT in the last 72 hours. Thyroid Function Tests: Recent Labs    11/15/18 0438  TSH 0.434   Anemia Panel: No results for input(s): VITAMINB12, FOLATE, FERRITIN, TIBC, IRON, RETICCTPCT in the last 72 hours. Sepsis Labs: No results for input(s): PROCALCITON, LATICACIDVEN in the last 168 hours.  Recent Results (from the past 240 hour(s))  SARS Coronavirus 2 [facility] order, Performed in [facility] hospital lab) Nasopharyngeal Nasopharyngeal Swab     Status: None   Collection Time: 11/12/18 11:42 PM   Specimen: Nasopharyngeal Swab  Result Value Ref Range Status   SARS Coronavirus 2 NEGATIVE NEGATIVE Final    Comment: (NOTE) If result is NEGATIVE SARS-CoV-2 target nucleic acids are NOT DETECTED. The SARS-CoV-2 RNA is generally detectable in upper and lower  respiratory specimens during the acute phase of infection. The lowest  concentration of SARS-CoV-2 viral copies this assay can detect is 250  copies / mL. A negative result does not preclude SARS-CoV-2 infection  and should not be used as the  sole basis for treatment or other  patient management decisions.  A negative result may occur with  improper specimen collection / handling, submission of specimen other  than nasopharyngeal swab, presence of viral mutation(s) within the  areas targeted by this assay, and inadequate number of viral copies  (<250 copies / mL). A negative result must be combined with clinical  observations, patient history, and epidemiological information. If result is POSITIVE SARS-CoV-2 target nucleic acids are DETECTED. The SARS-CoV-2 RNA is generally detectable in upper and lower  respiratory specimens dur ing the acute phase of infection.  Positive  results are indicative of active infection with SARS-CoV-2.  Clinical  correlation with patient history and other diagnostic information is  necessary to determine patient infection status.  Positive results do  not rule out bacterial infection or co-infection with other viruses. If result is PRESUMPTIVE POSTIVE SARS-CoV-2 nucleic acids MAY BE PRESENT.   A presumptive positive result was obtained on the submitted specimen  and confirmed on repeat testing.  While 2019 novel coronavirus  (SARS-CoV-2) nucleic acids may be present in the submitted sample  additional confirmatory testing may be necessary for epidemiological  and / or clinical management purposes  to differentiate between  SARS-CoV-2 and other Sarbecovirus currently known to infect humans.  If clinically indicated additional testing with an alternate test  methodology [phone]) is advised. The SARS-CoV-2 RNA is generally  detectable in upper and lower respiratory sp ecimens during the acute  phase of infection. The expected result is Negative. Fact Sheet for Patients:  StrictlyIdeas.no Fact Sheet for Healthcare Providers: BankingDealers.co.za This test is not yet approved or cleared by the [place] FDA and has been authorized for detection  and/or diagnosis of SARS-CoV-2 by FDA under an Emergency Use Authorization (EUA).  This EUA will remain in effect (meaning this test can be used) for the duration of the COVID-19 declaration under Section 564(b)(1) of the Act, 21 U.S.C. section 360bbb-3(b)(1), unless the authorization is terminated or revoked sooner. Performed at [facility], [place] [address], [place], [place] [address]          Radiology Studies: Dg Pelvis 1-2 Views  Result Date: 11/16/2018 CLINICAL DATA:  Hip pain EXAM: PELVIS - 1-2 VIEW COMPARISON:  09/03/2017 FINDINGS: Examination is significantly limited secondary to poor penetration from patient body habitus. Right total hip arthroplasty, grossly intact. Degenerative changes of the left hip and bilateral SI joints. No  evidence of fracture or diastasis. Sacrum and right iliac bone are largely obscured by overlying bowel gas. IMPRESSION: Limited exam.  No obvious osseous abnormality. Electronically Signed   By: Davina Poke M.D.   On: 11/16/2018 14:23        Scheduled Meds: . ALPRAZolam  0.25 mg Oral TID  . busPIRone  10 mg Oral BID  . ezetimibe  10 mg Oral QHS  . ferrous sulfate  325 mg Oral Once per day on Sun Mon Wed Fri  . furosemide  20 mg Oral Daily  . gabapentin  600 mg Oral TID  . insulin aspart  0-9 Units Subcutaneous TID WC  . insulin detemir  60 Units Subcutaneous QHS  . magnesium oxide  400 mg Oral TID  . Melatonin  3 mg Oral QHS  . metoprolol succinate  37.5 mg Oral Daily  . potassium chloride  40 mEq Oral TID  . pramipexole  0.125 mg Oral QHS  . sodium chloride flush  3 mL Intravenous Once  . spironolactone  12.5 mg Oral Daily  . tiZANidine  4 mg Oral TID  . vitamin C  500 mg Oral Daily  . warfarin  7.5 mg Oral Once per day on Sun Wed   And  . warfarin  5 mg Oral Once per day on Mon Tue Thu Fri Sat  . Warfarin - Pharmacist Dosing Inpatient   Does not apply q1800   Continuous Infusions:   LOS: 4 days    Time spent:35  minutes.     Domenic Polite, MD [facility]  11/17/2018, 1:12 PM

## 2018-11-17 NOTE — Progress Notes (Signed)
Inpatient Diabetes Program Recommendations  AACE/ADA: New Consensus Statement on Inpatient Glycemic Control (2015)  Target Ranges:  Prepandial:   less than 140 mg/dL      Peak postprandial:   less than 180 mg/dL (1-2 hours)      Critically ill patients:  140 - 180 mg/dL   Lab Results  Component Value Date   GLUCAP 195 (H) 11/17/2018   HGBA1C 7.5 (H) 11/13/2018    Review of Glycemic Control Results for Donna [name], Donna [name] (MRN [id number]) as of 11/17/2018 12:28  Ref. Range 11/16/2018 11:46 11/16/2018 16:44 11/16/2018 21:35 11/17/2018 06:22 11/17/2018 10:49  Glucose-Capillary Latest Ref Range: 70 - 99 mg/dL 150 (H) 262 (H) 211 (H) 160 (H) 195 (H)  Outpatient Diabetes medications: Trulicity 1.5 mg QSaturday, Levemir 60 units, Humalog 6-14 units tid  Current orders for Inpatient glycemic control:  Levemir 60 units qhs Novolog 0-9 units tid Inpatient Diabetes Program Recommendations:    Consider adding Novolog meal coverage 4 units tid with meals.   Thanks, Adah Perl, RN, BC-ADM Inpatient Diabetes Coordinator Pager [phone] (8a-5p)

## 2018-11-17 NOTE — TOC Progression Note (Addendum)
Transition of Care [facility]) - Progression Note    Patient Details  Name: Donna [name] MRN: [id number] Date of Birth: October 11, 1950  Transition of Care [facility]) CM/SW [place], [place] Phone Number: 11/17/2018, 1:59 PM  Clinical Narrative:     PASRR level II remains pending- patient is unable to discharge to SNF without PASRR number - patient must have COVID 24-48 hrs prior to discharge.  Thurmond Butts, MSW, [facility] Clinical Social Worker [phone]   Expected Discharge Plan: Skilled Nursing Facility Barriers to Discharge: Continued Medical Work up  Expected Discharge Plan and Services Expected Discharge Plan: [place] In-house Referral: Clinical Social Work     Living arrangements for the past 2 months: Single Family Home                                       Social Determinants of Health (SDOH) Interventions    Readmission Risk Interventions No flowsheet data found.

## 2018-11-18 LAB — GLUCOSE, CAPILLARY
Glucose-Capillary: 219 mg/dL — ABNORMAL HIGH (ref 70–99)
Glucose-Capillary: 139 mg/dL — ABNORMAL HIGH (ref 70–99)
Glucose-Capillary: 139 mg/dL — ABNORMAL HIGH (ref 70–99)
Glucose-Capillary: 178 mg/dL — ABNORMAL HIGH (ref 70–99)
Glucose-Capillary: 146 mg/dL — ABNORMAL HIGH (ref 70–99)
Glucose-Capillary: 146 mg/dL — ABNORMAL HIGH (ref 70–99)

## 2018-11-18 LAB — CBC
HCT: 32.9 % — ABNORMAL LOW (ref 36.0–46.0)
Hemoglobin: 9.6 g/dL — ABNORMAL LOW (ref 12.0–15.0)
MCH: 24.7 pg — ABNORMAL LOW (ref 26.0–34.0)
MCHC: 29.2 g/dL — ABNORMAL LOW (ref 30.0–36.0)
MCV: 84.8 fL (ref 80.0–100.0)
Platelets: 158 10*3/uL (ref 150–400)
RBC: 3.88 MIL/uL (ref 3.87–5.11)
RDW: 18.2 % — ABNORMAL HIGH (ref 11.5–15.5)
WBC: 6.3 10*3/uL (ref 4.0–10.5)
nRBC: 0 % (ref 0.0–0.2)

## 2018-11-18 LAB — BASIC METABOLIC PANEL
Anion gap: 8 (ref 5–15)
BUN: 15 mg/dL (ref 8–23)
CO2: 24 mmol/L (ref 22–32)
Calcium: 8.7 mg/dL — ABNORMAL LOW (ref 8.9–10.3)
Chloride: 105 mmol/L (ref 98–111)
Creatinine, Ser: 0.76 mg/dL (ref 0.44–1.00)
GFR calc Af Amer: >60 mL/min (ref >60)
GFR calc non Af Amer: >60 mL/min (ref >60)
Glucose, Bld: 185 mg/dL — ABNORMAL HIGH (ref 70–99)
Potassium: 5.1 mmol/L (ref 3.5–5.1)
Sodium: 137 mmol/L (ref 135–145)

## 2018-11-18 LAB — PROTIME-INR
INR: 1.7 — ABNORMAL HIGH (ref 0.8–1.2)
Prothrombin Time: 19.5 seconds — ABNORMAL HIGH (ref 11.4–15.2)

## 2018-11-18 MED ORDER — POTASSIUM CHLORIDE CRYS ER 20 MEQ PO TBCR: 40.0000 meq | EXTENDED_RELEASE_TABLET | Freq: Every day | ORAL | Status: DC

## 2018-11-18 MED ORDER — ONDANSETRON HCL 4 MG/2ML IJ SOLN
4.0000 mg | Freq: Four times a day (QID) | INTRAMUSCULAR | Status: DC | PRN
  Administered 2018-11-19: 4 mg via INTRAVENOUS
  Filled 2018-11-18: qty 2

## 2018-11-18 MED ORDER — WARFARIN SODIUM 10 MG PO TABS: 10.0000 mg | ORAL_TABLET | Freq: Once | ORAL | Status: DC

## 2018-11-18 MED ORDER — TIZANIDINE HCL 4 MG PO TABS: 4.0000 mg | ORAL_TABLET | Freq: Three times a day (TID) | ORAL | Status: DC | PRN

## 2018-11-18 MED ORDER — WARFARIN SODIUM 10 MG PO TABS
10.0000 mg | ORAL_TABLET | ORAL | Status: AC
  Administered 2018-11-18: 10 mg via ORAL
  Filled 2018-11-18: qty 1

## 2018-11-18 NOTE — Progress Notes (Signed)
ANTICOAGULATION CONSULT NOTE - Follow Up Consult  Pharmacy Consult for Warfarin Indication: atrial fibrillation  Allergies  Allergen Reactions  . Bee Venom Hives    Takes benadryl  . Other Other (See Comments)    Raw foods with seeds give her "boils".  Avoids raw strawberries, blueberries. Tolerates cooked fruits, tomato sauce, bread/grains with seeds. [facility] 10/17/13 Berries with seeds Allergy to glutamine-C-quercet-selen-brom per [facility] 09/25/17  . Alteplase Rash  . Cefepime Rash  . Fentanyl Other (See Comments)    Hallucinations, syncope    . Metformin And Related Diarrhea    Patient Measurements: Height: 5\' 11"  (180.3 cm) Weight: 260 lb 5.8 oz (118.1 kg) IBW/kg (Calculated) : 70.8  Vital Signs: Temp: 97.7 F (36.5 C) (09/17 0752) Temp Source: Oral (09/17 0752) BP: 115/64 (09/17 0752) Pulse Rate: 72 (09/17 0752)  Labs: Recent Labs    11/16/18 0356  11/16/18 0752 11/17/18 0240 11/18/18 0321  HGB  --    < > 11.8* 10.1* 9.6*  HCT  --   --  37.8 33.8* 32.9*  PLT  --   --  177 152 158  LABPROT 23.6*  --   --  22.2* 19.5*  INR 2.1*  --   --  2.0* 1.7*  CREATININE  --   --  0.71 0.70 0.76   < > = values in this interval not displayed.    Estimated Creatinine Clearance: 96.6 mL/min (by C-G formula based on SCr of 0.76 mg/dL).   Assessment:  Anticoag: Warfarin PTA for afib. INR 1.7. Hgb 11.8>>10.1>>9.6. Plts WNL. Doses have been charted -Coumadin 5 mg daily except 7.5 mg SunWed PTA. Pt's INR on admit was 2.4  Goal of Therapy:  INR 2-3 Monitor platelets by anticoagulation protocol: Yes   Plan:  Give Coumadin 10mg  po x 1 now. Daily INR   Modene Andy S. Alford Highland, PharmD, BCPS Clinical Staff Pharmacist Eilene Ghazi Stillinger 11/18/2018,11:06 AM

## 2018-11-18 NOTE — Progress Notes (Signed)
Physical Therapy Treatment Patient Details Name: Donna [name] MRN: [id number] DOB: 03-Dec-1950 Today's Date: 11/18/2018    History of Present Illness Pt is a 68 y.o. female admitted 11/12/18 with generalized weakness. Found to be in afib with RVR, severe hypokalemia. PMH includes CHF, permanent afib, fibromyalgia, anxiety, depression, DM2, RLS.    PT Comments    PTA paged by RN to assist with getting pt back to bed after RNs unsuccessful.  PTA provided assistance to move pt into standing and patient assisted back to bed.  Pt required increased assistance this pm due to fatigue.  Plan remains for snf placement.      Follow Up Recommendations  SNF;Supervision/Assistance - 24 hour     Equipment Recommendations  Other (comment)(TBD at next venue)    Recommendations for Other Services       Precautions / Restrictions Precautions Precautions: Fall Restrictions Weight Bearing Restrictions: Yes RLE Weight Bearing: Weight bearing as tolerated LLE Weight Bearing: Weight bearing as tolerated    Mobility  Bed Mobility Overal bed mobility: Needs Assistance Bed Mobility: Sit to Supine     Supine to sit: HOB elevated;Mod assist Sit to supine: Mod assist;+2 for physical assistance   General bed mobility comments: Increased assistance to return to bed to lower trunk and lift B LEs back into bed against gravity.  Transfers Overall transfer level: Needs assistance Equipment used: Ambulation equipment used(sara stedy) Transfers: Sit to/from Stand Sit to Stand: Max assist;+2 physical assistance(from lower seated recliner.)         General transfer comment: Pt required increased assistance to rise from surface of chair.  Cues to scoot to edge of chair and lean forward. PTA and tech used bed pad to elevate hips.  Once in flexed position in standing.  Stedy plates applied behind patient's bottom. Pt with increased HR and anxious.  Pt performed additonal standing trials to clean patient of BM  before returning back to bed.  Ambulation/Gait Ambulation/Gait assistance: (NT unable to achieve upright standing at this time.)               Stairs             Wheelchair Mobility    Modified Rankin (Stroke Patients Only)       Balance Overall balance assessment: Needs assistance Sitting-balance support: No upper extremity supported;Feet supported Sitting balance-Leahy Scale: Fair       Standing balance-Leahy Scale: Poor                              Cognition Arousal/Alertness: Awake/alert Behavior During Therapy: Anxious Overall Cognitive Status: No family/caregiver present to determine baseline cognitive functioning Area of Impairment: Attention;Problem solving                   Current Attention Level: Selective   Following Commands: Follows one step commands consistently Safety/Judgement: Decreased awareness of safety   Problem Solving: Requires verbal cues General Comments: Pt reports she has memory difficulty at baseline and reports difficulty with word finding is baseline as well      Exercises     General Comments        Pertinent Vitals/Pain Pain Assessment: Faces Faces Pain Scale: Hurts little more Pain Location: generalized Pain Descriptors / Indicators: Grimacing;Guarding;Crying Pain Intervention(s): Monitored during session    Home Living  Prior Function            PT Goals (current goals can now be found in the care plan section) Acute Rehab PT Goals Patient Stated Goal: Be able to stand and walk again PT Goal Formulation: With patient Potential to Achieve Goals: Fair Progress towards PT goals: Progressing toward goals    Frequency    Min 2X/week      PT Plan Current plan remains appropriate    Co-evaluation              AM-PAC PT "6 Clicks" Mobility   Outcome Measure  Help needed turning from your back to your side while in a flat bed without using  bedrails?: A Lot Help needed moving from lying on your back to sitting on the side of a flat bed without using bedrails?: A Lot Help needed moving to and from a bed to a chair (including a wheelchair)?: Total Help needed standing up from a chair using your arms (e.g., wheelchair or bedside chair)?: Total Help needed to walk in hospital room?: Total Help needed climbing 3-5 steps with a railing? : Total 6 Click Score: 8    End of Session Equipment Utilized During Treatment: Gait belt Activity Tolerance: Patient limited by fatigue Patient left: in chair;with call bell/phone within reach Nurse Communication: Mobility status;Need for lift equipment PT Visit Diagnosis: Other abnormalities of gait and mobility (R26.89);Pain Pain - part of body: Hand;Leg     Time: [id number] PT Time Calculation (min) (ACUTE ONLY): 25 min  Charges: $Therapeutic Activity: 23-37 mins                     Governor Rooks, PTA Acute Rehabilitation Services Pager [phone] Office [phone]     Edwina Grossberg Eli Hose 11/18/2018, 3:59 PM

## 2018-11-18 NOTE — TOC Progression Note (Signed)
Transition of Care [facility]) - Progression Note    Patient Details  Name: Donna [name] MRN: [id number] Date of Birth: 09-04-1950  Transition of Care [facility]) CM/SW [place], [place] Phone Number: 11/18/2018, 12:50 PM  Clinical Narrative:     Received PSARR today- patient will need Covid test results before discharge to SNF.  Thurmond Butts, MSW, [facility] Clinical Social Worker [phone]    Expected Discharge Plan: Skilled Nursing Facility Barriers to Discharge: Continued Medical Work up  Expected Discharge Plan and Services Expected Discharge Plan: [place] In-house Referral: Clinical Social Work     Living arrangements for the past 2 months: Single Family Home                                       Social Determinants of Health (SDOH) Interventions    Readmission Risk Interventions No flowsheet data found.

## 2018-11-18 NOTE — Progress Notes (Addendum)
Per d/w Dr. Radford Pax 9/28 is OK for f/u - have scheduled with Jory Sims, NP on that day - BMET should be ordered at time of visit. Did not order in advance in case additional labs are felt necessary at that visit, and to avoid need for separate covid pre-screening. Dayna Dunn PA-C

## 2018-11-18 NOTE — Progress Notes (Addendum)
Physical Therapy Treatment Patient Details Name: Donna SCHREY MRN: [id number] DOB: Jun 17, 1950 Today's Date: 11/18/2018    History of Present Illness Pt is a 68 y.o. female admitted 11/12/18 with generalized weakness. Found to be in afib with RVR, severe hypokalemia. PMH includes CHF, permanent afib, fibromyalgia, anxiety, depression, DM2, RLS.    PT Comments    Pt performed supine LE exercises and transfer training with the [place] stedy.  Pt is slow and guarded and required max cues for encouragement.  Her HR elevated to 130 during transfer training.  She required cues for pursed lip breathing during session this pm.  Pt left sitting in recliner eating lunch post session.  Based on progress she continues to require SNF placement to improve strength and function before returning home.     Follow Up Recommendations  SNF;Supervision/Assistance - 24 hour     Equipment Recommendations  Other (comment)(TBD at next venue)    Recommendations for Other Services       Precautions / Restrictions Precautions Precautions: Fall Restrictions Weight Bearing Restrictions: Yes RLE Weight Bearing: Weight bearing as tolerated LLE Weight Bearing: Weight bearing as tolerated    Mobility  Bed Mobility Overal bed mobility: Needs Assistance Bed Mobility: Supine to Sit     Supine to sit: HOB elevated;Mod assist     General bed mobility comments: Pt required assistance to elevate B LEs to edge of bed and to elevate trunk into sitting.  Pt is slow and guarded and increased WOB noted with transition.  Highly anxious once in sitting.  Transfers Overall transfer level: Needs assistance Equipment used: Ambulation equipment used(sara stedy) Transfers: Sit to/from Stand Sit to Stand: Mod assist;From elevated surface         General transfer comment: Pt required moderate assistance to move into standing.  She was able to clear hips enough to place sara stedy plates but presents with flexed hips and  trunk.  Performed additional stand from plates and assistance provided to eccentrically load down into recliner.  Ambulation/Gait Ambulation/Gait assistance: (NT unable to achieve upright standing at this time.)               Stairs             Wheelchair Mobility    Modified Rankin (Stroke Patients Only)       Balance Overall balance assessment: Needs assistance   Sitting balance-Leahy Scale: Fair       Standing balance-Leahy Scale: Poor                              Cognition Arousal/Alertness: Awake/alert Behavior During Therapy: Anxious Overall Cognitive Status: No family/caregiver present to determine baseline cognitive functioning Area of Impairment: Attention;Problem solving                   Current Attention Level: Selective   Following Commands: Follows one step commands consistently Safety/Judgement: Decreased awareness of safety   Problem Solving: Requires verbal cues General Comments: Pt reports she has memory difficulty at baseline and reports difficulty with word finding is baseline as well      Exercises General Exercises - Lower Extremity Ankle Circles/Pumps: AROM;Both;20 reps;Supine Quad Sets: AROM;Both;10 reps;Supine Heel Slides: AROM;Both;10 reps;Supine Hip ABduction/ADduction: AROM;Both;10 reps;Supine Straight Leg Raises: AROM;Both;10 reps;Supine    General Comments        Pertinent Vitals/Pain Pain Assessment: Faces Faces Pain Scale: Hurts little more Pain Location: generalized Pain Descriptors / Indicators:  Grimacing;Guarding;Crying Pain Intervention(s): Monitored during session;Repositioned    Home Living                      Prior Function            PT Goals (current goals can now be found in the care plan section) Acute Rehab PT Goals Patient Stated Goal: Be able to stand and walk again PT Goal Formulation: With patient Potential to Achieve Goals: Fair Progress towards PT goals:  Progressing toward goals    Frequency    Min 2X/week      PT Plan Current plan remains appropriate    Co-evaluation              AM-PAC PT "6 Clicks" Mobility   Outcome Measure  Help needed turning from your back to your side while in a flat bed without using bedrails?: A Lot Help needed moving from lying on your back to sitting on the side of a flat bed without using bedrails?: A Lot Help needed moving to and from a bed to a chair (including a wheelchair)?: Total Help needed standing up from a chair using your arms (e.g., wheelchair or bedside chair)?: Total Help needed to walk in hospital room?: Total Help needed climbing 3-5 steps with a railing? : Total 6 Click Score: 8    End of Session Equipment Utilized During Treatment: Gait belt Activity Tolerance: Patient limited by fatigue Patient left: in chair;with call bell/phone within reach Nurse Communication: Mobility status;Need for lift equipment PT Visit Diagnosis: Other abnormalities of gait and mobility (R26.89);Pain Pain - part of body: Hand;Leg     Time: [id number] PT Time Calculation (min) (ACUTE ONLY): 27 min  Charges:  $Therapeutic Exercise: 8-22 mins $Therapeutic Activity: 8-22 mins                     Governor Rooks, PTA Acute Rehabilitation Services Pager [phone] Office [phone]     Tarron Krolak Eli Hose 11/18/2018, 3:00 PM

## 2018-11-18 NOTE — Progress Notes (Addendum)
PROGRESS NOTE    Donna [name]  [id number] DOB: 1950/05/30 DOA: 11/12/2018 PCP: Janine Limbo, PA-C   Brief Narrative: 68 year old with past medical history significant for A. fib on Coumadin and amiodarone, chronic diastolic heart failure diabetes, diabetic lumbosacral plexopathy, OSA on CPAP, restless leg syndrome who presented with generalized weakness.  She reported frequent falls at home, become more nonambulatory over 46mths -1year, she has been taking Lasix and metolazone, recently stopped taking the spironolactone as advised by her cardiology.  She presented with severe hypokalemia and EKG with prolonged QT.  She was in A. fib RVR.  She was a started on Cardizem drip and supplemental potassium.  Heart rate is controlled she is off of Cardizem.  She was a started on low-dose Toprol.   -In addition has been struggling with left leg weakness, generalized weakness for 6 months to a year, has seen her neurosurgeon and an orthopedic surgeon for this, MRI of thoracic spine noted severe DJD, but not felt to explain her neurological symptoms, neurology follow-up recommended for this for nerve conduction studies . -PT OT evaluation completed, SNF recommended    Assessment & Plan:   1-Severe hypokalemia: despite taking 144meq KCL daily. -From long-term loop diuretic therapy -Potassium improved on larger replacement doses, Aldactone -K is 5.1 now, on replacement, ARB and Aldactone, will hold additional potassium today -Discharge dose of diuretics, ARB, Aldactone, potassium per cardiology and renal -Will need close follow-up and monitoring of potassium -Discharge planning, PT OT -Plan for short-term rehab  2-A.Fib  RVR, chronic: She was not on rate control medications due to blood pressure issues.  She was treated initially with IV Cardizem drip, subsequently stopped, started on low-dose Toprol. She was previously taking amiodarone but it was discontinued by her provider. -Heart rate improved,  currently on Toprol XL 37.5 mg daily  3-Chronic diastolic heart failure; venous insufficiency Cardiology adjusted medications.  Metolazone stopped due to hypotension. -Lasix restarted 20 mg daily and Aldactone 12.5 mg daily -Per cardiology,  -appears close to euvolemic  4-Prolonged QT; improved after potassium supplementation.  5-Anxiety depression: Continue with buspirone and Xanax.  6-Hyperlipidemia: Continue with Zetia  7-History of restless leg syndrome: Continue with pramipexole  8-Diabetes type 2: Continue with insulin.  9-Frequent fall: She will need PT OT will probably require placement.  10-chronic low back pain, chronic left leg and generalized weakness -Patient reports that this is been ongoing for 3 to 6 months, spouse says 6 months to 1 year  -followed by orthopedic surgery at [facility] for this and recently saw a neurosurgeon, had MRI done which noted severe DJD at L4-L5 and L5-S1 but could not explain her chronic left leg weakness, he referred her to a neurologist in [place], she is due to follow-up soon, needs nerve conduction studies. -In addition also has peripheral neuropathy from longstanding diabetes which is contributing to her gait problems as well -Plan for short-term rehab now  -Home regimen of oxycodone and gabapentin continued  Pressure Injury 09/29/17 Unstageable - Full thickness tissue loss in which the base of the ulcer is covered by slough (yellow, tan, gray, green or brown) and/or eschar (tan, brown or black) in the wound bed. (Active)  09/29/17 1426  Location: Sacrum  Location Orientation: Mid  Staging: Unstageable - Full thickness tissue loss in which the base of the ulcer is covered by slough (yellow, tan, gray, green or brown) and/or eschar (tan, brown or black) in the wound bed.  Wound Description (Comments):   Present on Admission:  Estimated body mass index is 36.31 kg/m as calculated from the following:   Height as of this encounter: 5'  11" (1.803 m).   Weight as of this encounter: 118.1 kg.   DVT prophylaxis: Coumadin Code Status: Full code Family Communication: Care discussed with patient and spouse 9/16 Disposition Plan: SNF when bed available  Consultants:   Cardiology  Renal  Procedures:   None  Antimicrobials:  None  Subjective: -Complains of chronic low back pain, breathing is improved  Objective: Vitals:   11/17/18 1625 11/17/18 2023 11/18/18 0319 11/18/18 0752  BP: 135/78 120/74 127/80 115/64  Pulse: 91 81 83 72  Resp: 20 (!) 27 19 16   Temp: 99.4 F (37.4 C) 98.4 F (36.9 C) 98.4 F (36.9 C) 97.7 F (36.5 C)  TempSrc: Oral Oral Oral Oral  SpO2: 94% 96% 96% 97%  Weight:      Height:        Intake/Output Summary (Last 24 hours) at 11/18/2018 1146 Last data filed at 11/18/2018 0615 Gross per 24 hour  Intake 320 ml  Output -  Net 320 ml   Filed Weights   11/12/18 1727 11/13/18 0614  Weight: 117.9 kg 118.1 kg    Examination:  Gen: Awake, Alert, Oriented X 3, [place] obese sitting up in bed AAO x2, cognitive and memory deficits noted HEENT: PERRLA, Neck supple, no JVD Lungs: Decreased breath sounds at both bases CVS: RRR,No Gallops,Rubs or new Murmurs Abd: soft, Non tender, non distended, BS present Extremities: No  edema Skin: no new rashes 2+ bilateral lower extremity strength   Data Reviewed: I have personally reviewed following labs and imaging studies  CBC: Recent Labs  Lab 11/13/18 0459 11/14/18 0221 11/16/18 0752 11/17/18 0240 11/18/18 0321  WBC 11.6* 7.6 8.9 6.4 6.3  HGB 12.5 11.0* 11.8* 10.1* 9.6*  HCT 40.3 35.4* 37.8 33.8* 32.9*  MCV 80.9 81.2 83.8 84.5 84.8  PLT 195 162 177 152 0000000   Basic Metabolic Panel: Recent Labs  Lab 11/13/18 0459  11/14/18 0024 11/14/18 1441 11/15/18 0438 11/16/18 0752 11/17/18 0240 11/18/18 0321  NA 134*  --  135  --  139 141 137 137  K 2.2*   < > 2.9* 3.7 3.5 3.0* 4.3 5.1  CL 93*  --  97*  --  103 102 105 105  CO2 27   --  27  --  26 28 24 24   GLUCOSE 314*  --  353*  --  171* 102* 202* 185*  BUN 20  --  17  --  16 14 17 15   CREATININE 0.99  --  0.91  --  0.74 0.71 0.70 0.76  CALCIUM 8.6*  --  8.4*  --  8.7* 8.7* 8.7* 8.7*  MG 2.1  --   --   --   --   --   --   --    < > = values in this interval not displayed.   GFR: Estimated Creatinine Clearance: 96.6 mL/min (by C-G formula based on SCr of 0.76 mg/dL). Liver Function Tests: No results for input(s): AST, ALT, ALKPHOS, BILITOT, PROT, ALBUMIN in the last 168 hours. No results for input(s): LIPASE, AMYLASE in the last 168 hours. No results for input(s): AMMONIA in the last 168 hours. Coagulation Profile: Recent Labs  Lab 11/14/18 0024 11/15/18 0438 11/16/18 0356 11/17/18 0240 11/18/18 0321  INR 2.2* 2.2* 2.1* 2.0* 1.7*   Cardiac Enzymes: No results for input(s): CKTOTAL, CKMB, CKMBINDEX, TROPONINI in the last 168  hours. BNP (last 3 results) No results for input(s): PROBNP in the last 8760 hours. HbA1C: No results for input(s): HGBA1C in the last 72 hours. CBG: Recent Labs  Lab 11/17/18 0622 11/17/18 1049 11/17/18 1623 11/17/18 2141 11/18/18 0640  GLUCAP 160* 195* 209* 219* 139*   Lipid Profile: No results for input(s): CHOL, HDL, LDLCALC, TRIG, CHOLHDL, LDLDIRECT in the last 72 hours. Thyroid Function Tests: No results for input(s): TSH, T4TOTAL, FREET4, T3FREE, THYROIDAB in the last 72 hours. Anemia Panel: No results for input(s): VITAMINB12, FOLATE, FERRITIN, TIBC, IRON, RETICCTPCT in the last 72 hours. Sepsis Labs: No results for input(s): PROCALCITON, LATICACIDVEN in the last 168 hours.  Recent Results (from the past 240 hour(s))  SARS Coronavirus 2 [facility] order, Performed in [facility] hospital lab) Nasopharyngeal Nasopharyngeal Swab     Status: None   Collection Time: 11/12/18 11:42 PM   Specimen: Nasopharyngeal Swab  Result Value Ref Range Status   SARS Coronavirus 2 NEGATIVE NEGATIVE Final    Comment: (NOTE) If  result is NEGATIVE SARS-CoV-2 target nucleic acids are NOT DETECTED. The SARS-CoV-2 RNA is generally detectable in upper and lower  respiratory specimens during the acute phase of infection. The lowest  concentration of SARS-CoV-2 viral copies this assay can detect is 250  copies / mL. A negative result does not preclude SARS-CoV-2 infection  and should not be used as the sole basis for treatment or other  patient management decisions.  A negative result may occur with  improper specimen collection / handling, submission of specimen other  than nasopharyngeal swab, presence of viral mutation(s) within the  areas targeted by this assay, and inadequate number of viral copies  (<250 copies / mL). A negative result must be combined with clinical  observations, patient history, and epidemiological information. If result is POSITIVE SARS-CoV-2 target nucleic acids are DETECTED. The SARS-CoV-2 RNA is generally detectable in upper and lower  respiratory specimens dur ing the acute phase of infection.  Positive  results are indicative of active infection with SARS-CoV-2.  Clinical  correlation with patient history and other diagnostic information is  necessary to determine patient infection status.  Positive results do  not rule out bacterial infection or co-infection with other viruses. If result is PRESUMPTIVE POSTIVE SARS-CoV-2 nucleic acids MAY BE PRESENT.   A presumptive positive result was obtained on the submitted specimen  and confirmed on repeat testing.  While 2019 novel coronavirus  (SARS-CoV-2) nucleic acids may be present in the submitted sample  additional confirmatory testing may be necessary for epidemiological  and / or clinical management purposes  to differentiate between  SARS-CoV-2 and other Sarbecovirus currently known to infect humans.  If clinically indicated additional testing with an alternate test  methodology [phone]) is advised. The SARS-CoV-2 RNA is generally   detectable in upper and lower respiratory sp ecimens during the acute  phase of infection. The expected result is Negative. Fact Sheet for Patients:  StrictlyIdeas.no Fact Sheet for Healthcare Providers: BankingDealers.co.za This test is not yet approved or cleared by the [place] FDA and has been authorized for detection and/or diagnosis of SARS-CoV-2 by FDA under an Emergency Use Authorization (EUA).  This EUA will remain in effect (meaning this test can be used) for the duration of the COVID-19 declaration under Section 564(b)(1) of the Act, 21 U.S.C. section 360bbb-3(b)(1), unless the authorization is terminated or revoked sooner. Performed at [facility], [address] [place], [place] [address]  Radiology Studies: Dg Pelvis 1-2 Views  Result Date: 11/16/2018 CLINICAL DATA:  Hip pain EXAM: PELVIS - 1-2 VIEW COMPARISON:  09/03/2017 FINDINGS: Examination is significantly limited secondary to poor penetration from patient body habitus. Right total hip arthroplasty, grossly intact. Degenerative changes of the left hip and bilateral SI joints. No evidence of fracture or diastasis. Sacrum and right iliac bone are largely obscured by overlying bowel gas. IMPRESSION: Limited exam.  No obvious osseous abnormality. Electronically Signed   By: Davina Poke M.D.   On: 11/16/2018 14:23        Scheduled Meds: . ALPRAZolam  0.25 mg Oral TID  . busPIRone  10 mg Oral BID  . ezetimibe  10 mg Oral QHS  . ferrous sulfate  325 mg Oral Once per day on Sun Mon Wed Fri  . furosemide  20 mg Oral Daily  . gabapentin  300 mg Oral TID  . influenza vaccine adjuvanted  0.5 mL Intramuscular Tomorrow-1000  . insulin aspart  0-9 Units Subcutaneous TID WC  . insulin detemir  60 Units Subcutaneous QHS  . magnesium oxide  400 mg Oral TID  . Melatonin  3 mg Oral QHS  . metoprolol succinate  37.5 mg Oral Daily  . pramipexole  0.125  mg Oral QHS  . sodium chloride flush  3 mL Intravenous Once  . spironolactone  12.5 mg Oral Daily  . vitamin C  500 mg Oral Daily  . warfarin  10 mg Oral NOW  . Warfarin - Pharmacist Dosing Inpatient   Does not apply q1800   Continuous Infusions:   LOS: 5 days    Time spent:25 minutes.     Domenic Polite, MD [facility]  11/18/2018, 11:46 AM

## 2018-11-18 NOTE — Progress Notes (Signed)
[facility]    NEPHROLOGY PROGRESS NOTE  SUBJECTIVE: UA collected yesterday with few bacteria, may WBCs.  I ordered add-on culture today.    OBJECTIVE:  Vitals:   11/18/18 0319 11/18/18 0752  BP: 127/80 115/64  Pulse: 83 72  Resp: 19 16  Temp: 98.4 F (36.9 C) 97.7 F (36.5 C)  SpO2: 96% 97%    Intake/Output Summary (Last 24 hours) at 11/18/2018 1339 Last data filed at 11/18/2018 1306 Gross per 24 hour  Intake 320 ml  Output 810 ml  Net -490 ml      General:  Sleeping, easily arousable Neck:  No JVD CV:  Heart RRR  Lungs:  Clear bilaterally  Abd:  abd SNT/ND with normal BS Extremities:  Trace LE edema  MEDICATIONS:  . ALPRAZolam  0.25 mg Oral TID  . busPIRone  10 mg Oral BID  . ezetimibe  10 mg Oral QHS  . ferrous sulfate  325 mg Oral Once per day on Sun Mon Wed Fri  . furosemide  20 mg Oral Daily  . gabapentin  300 mg Oral TID  . influenza vaccine adjuvanted  0.5 mL Intramuscular Tomorrow-1000  . insulin aspart  0-9 Units Subcutaneous TID WC  . insulin detemir  60 Units Subcutaneous QHS  . magnesium oxide  400 mg Oral TID  . Melatonin  3 mg Oral QHS  . metoprolol succinate  37.5 mg Oral Daily  . pramipexole  0.125 mg Oral QHS  . sodium chloride flush  3 mL Intravenous Once  . spironolactone  12.5 mg Oral Daily  . vitamin C  500 mg Oral Daily  . warfarin  10 mg Oral NOW  . Warfarin - Pharmacist Dosing Inpatient   Does not apply q1800       LABS:   CBC Latest Ref Rng & Units 11/18/2018 11/17/2018 11/16/2018  WBC 4.0 - 10.5 K/uL 6.3 6.4 8.9  Hemoglobin 12.0 - 15.0 g/dL 9.6(L) 10.1(L) 11.8(L)  Hematocrit 36.0 - 46.0 % 32.9(L) 33.8(L) 37.8  Platelets 150 - 400 K/uL 158 152 177    CMP Latest Ref Rng & Units 11/18/2018 11/17/2018 11/16/2018  Glucose 70 - 99 mg/dL 185(H) 202(H) 102(H)  BUN 8 - 23 mg/dL 15 17 14   Creatinine 0.44 - 1.00 mg/dL 0.76 0.70 0.71  Sodium 135 - 145 mmol/L 137 137 141  Potassium 3.5 - 5.1 mmol/L 5.1 4.3 3.0(L)  Chloride  98 - 111 mmol/L 105 105 102  CO2 22 - 32 mmol/L 24 24 28   Calcium 8.9 - 10.3 mg/dL 8.7(L) 8.7(L) 8.7(L)  Total Protein 6.0 - 8.5 g/dL - - -  Total Bilirubin 0.0 - 1.2 mg/dL - - -  Alkaline Phos 39 - 117 IU/L - - -  AST 0 - 40 IU/L - - -  ALT 0 - 32 IU/L - - -    Lab Results  Component Value Date   CALCIUM 8.7 (L) 11/18/2018   PHOS 3.7 09/07/2017       Component Value Date/Time   COLORURINE YELLOW 11/17/2018 1822   APPEARANCEUR HAZY (A) 11/17/2018 1822   LABSPEC 1.012 11/17/2018 1822   PHURINE 5.0 11/17/2018 1822   GLUCOSEU NEGATIVE 11/17/2018 1822   HGBUR NEGATIVE 11/17/2018 1822   BILIRUBINUR NEGATIVE 11/17/2018 1822   KETONESUR NEGATIVE 11/17/2018 1822   PROTEINUR NEGATIVE 11/17/2018 1822   UROBILINOGEN 1.0 09/06/2010 1130   NITRITE NEGATIVE 11/17/2018 1822   LEUKOCYTESUR MODERATE (A) 11/17/2018 1822   No results found for: PHART, PCO2ART, PO2ART,  HCO3, TCO2, ACIDBASEDEF, O2SAT     Component Value Date/Time   IRON 24 (L) 09/06/2017 0502   TIBC 214 (L) 09/06/2017 0502   FERRITIN 316 (H) 09/06/2017 0502   IRONPCTSAT 11 09/06/2017 0502       ASSESSMENT/PLAN:     1.  Severe hypokalemia.  This is been recurrent.  She takes 120 mEq of potassium daily.  She is on Lasix and metolazone at home for diastolic heart failure.  Her magnesium level has been normal.  Getting better with aggressive repletion.  Initially 40 Meq TID.  K 5.1 this AM so will hold and restart 40 mEq tomorrow morning.  Suspect this will be her stable home dose but would still check daily while she's here.    2.  Chronic diastolic congestive heart failure.  As above, will continue furosemide with addition of spironolactone.  3.  Atrial fibrillation with RVR.  Heart rate improved.  4.  Type 2 diabetes.  Continue current management.  5.  Slight confusion- decrease gabapentin to 300 TID, UA with 21-50 WBCs, add on culture.    We will sign off.  Please let us know if we can be of further assistance.

## 2018-11-19 DIAGNOSIS — R29898 Other symptoms and signs involving the musculoskeletal system: Secondary | ICD-10-CM | POA: Diagnosis not present

## 2018-11-19 DIAGNOSIS — L98411 Non-pressure chronic ulcer of buttock limited to breakdown of skin: Secondary | ICD-10-CM | POA: Diagnosis not present

## 2018-11-19 DIAGNOSIS — E119 Type 2 diabetes mellitus without complications: Secondary | ICD-10-CM | POA: Diagnosis not present

## 2018-11-19 DIAGNOSIS — E1165 Type 2 diabetes mellitus with hyperglycemia: Secondary | ICD-10-CM | POA: Diagnosis not present

## 2018-11-19 DIAGNOSIS — R079 Chest pain, unspecified: Secondary | ICD-10-CM | POA: Diagnosis not present

## 2018-11-19 DIAGNOSIS — G4733 Obstructive sleep apnea (adult) (pediatric): Secondary | ICD-10-CM | POA: Diagnosis present

## 2018-11-19 DIAGNOSIS — N3001 Acute cystitis with hematuria: Secondary | ICD-10-CM | POA: Diagnosis not present

## 2018-11-19 DIAGNOSIS — N390 Urinary tract infection, site not specified: Secondary | ICD-10-CM | POA: Diagnosis not present

## 2018-11-19 DIAGNOSIS — Z7901 Long term (current) use of anticoagulants: Secondary | ICD-10-CM | POA: Diagnosis not present

## 2018-11-19 DIAGNOSIS — R0602 Shortness of breath: Secondary | ICD-10-CM | POA: Diagnosis not present

## 2018-11-19 DIAGNOSIS — R5381 Other malaise: Secondary | ICD-10-CM | POA: Diagnosis not present

## 2018-11-19 DIAGNOSIS — Z7409 Other reduced mobility: Secondary | ICD-10-CM | POA: Diagnosis not present

## 2018-11-19 DIAGNOSIS — Z96651 Presence of right artificial knee joint: Secondary | ICD-10-CM | POA: Diagnosis not present

## 2018-11-19 DIAGNOSIS — F419 Anxiety disorder, unspecified: Secondary | ICD-10-CM | POA: Diagnosis not present

## 2018-11-19 DIAGNOSIS — G541 Lumbosacral plexus disorders: Secondary | ICD-10-CM | POA: Diagnosis present

## 2018-11-19 DIAGNOSIS — E785 Hyperlipidemia, unspecified: Secondary | ICD-10-CM | POA: Diagnosis not present

## 2018-11-19 DIAGNOSIS — M545 Low back pain: Secondary | ICD-10-CM | POA: Diagnosis present

## 2018-11-19 DIAGNOSIS — I499 Cardiac arrhythmia, unspecified: Secondary | ICD-10-CM | POA: Diagnosis not present

## 2018-11-19 DIAGNOSIS — R0902 Hypoxemia: Secondary | ICD-10-CM | POA: Diagnosis not present

## 2018-11-19 DIAGNOSIS — M6281 Muscle weakness (generalized): Secondary | ICD-10-CM | POA: Diagnosis not present

## 2018-11-19 DIAGNOSIS — K219 Gastro-esophageal reflux disease without esophagitis: Secondary | ICD-10-CM | POA: Diagnosis not present

## 2018-11-19 DIAGNOSIS — I482 Chronic atrial fibrillation, unspecified: Secondary | ICD-10-CM | POA: Diagnosis not present

## 2018-11-19 DIAGNOSIS — X838XXA Intentional self-harm by other specified means, initial encounter: Secondary | ICD-10-CM | POA: Diagnosis not present

## 2018-11-19 DIAGNOSIS — G894 Chronic pain syndrome: Secondary | ICD-10-CM | POA: Diagnosis not present

## 2018-11-19 DIAGNOSIS — F5105 Insomnia due to other mental disorder: Secondary | ICD-10-CM | POA: Diagnosis not present

## 2018-11-19 DIAGNOSIS — Z760 Encounter for issue of repeat prescription: Secondary | ICD-10-CM | POA: Diagnosis not present

## 2018-11-19 DIAGNOSIS — Z743 Need for continuous supervision: Secondary | ICD-10-CM | POA: Diagnosis not present

## 2018-11-19 DIAGNOSIS — I5032 Chronic diastolic (congestive) heart failure: Secondary | ICD-10-CM | POA: Diagnosis not present

## 2018-11-19 DIAGNOSIS — F29 Unspecified psychosis not due to a substance or known physiological condition: Secondary | ICD-10-CM | POA: Diagnosis not present

## 2018-11-19 DIAGNOSIS — A499 Bacterial infection, unspecified: Secondary | ICD-10-CM | POA: Diagnosis not present

## 2018-11-19 DIAGNOSIS — U071 COVID-19: Secondary | ICD-10-CM | POA: Diagnosis not present

## 2018-11-19 DIAGNOSIS — L89309 Pressure ulcer of unspecified buttock, unspecified stage: Secondary | ICD-10-CM | POA: Diagnosis not present

## 2018-11-19 DIAGNOSIS — Z96641 Presence of right artificial hip joint: Secondary | ICD-10-CM | POA: Diagnosis not present

## 2018-11-19 DIAGNOSIS — Z79899 Other long term (current) drug therapy: Secondary | ICD-10-CM | POA: Diagnosis not present

## 2018-11-19 DIAGNOSIS — I5033 Acute on chronic diastolic (congestive) heart failure: Secondary | ICD-10-CM | POA: Diagnosis not present

## 2018-11-19 DIAGNOSIS — Z23 Encounter for immunization: Secondary | ICD-10-CM | POA: Diagnosis not present

## 2018-11-19 DIAGNOSIS — E1140 Type 2 diabetes mellitus with diabetic neuropathy, unspecified: Secondary | ICD-10-CM | POA: Diagnosis not present

## 2018-11-19 DIAGNOSIS — I110 Hypertensive heart disease with heart failure: Secondary | ICD-10-CM | POA: Diagnosis not present

## 2018-11-19 DIAGNOSIS — E876 Hypokalemia: Secondary | ICD-10-CM | POA: Diagnosis not present

## 2018-11-19 DIAGNOSIS — R279 Unspecified lack of coordination: Secondary | ICD-10-CM | POA: Diagnosis not present

## 2018-11-19 DIAGNOSIS — F331 Major depressive disorder, recurrent, moderate: Secondary | ICD-10-CM | POA: Diagnosis not present

## 2018-11-19 DIAGNOSIS — R509 Fever, unspecified: Secondary | ICD-10-CM | POA: Diagnosis present

## 2018-11-19 DIAGNOSIS — R300 Dysuria: Secondary | ICD-10-CM | POA: Diagnosis not present

## 2018-11-19 DIAGNOSIS — E1151 Type 2 diabetes mellitus with diabetic peripheral angiopathy without gangrene: Secondary | ICD-10-CM | POA: Diagnosis present

## 2018-11-19 DIAGNOSIS — R8290 Unspecified abnormal findings in urine: Secondary | ICD-10-CM | POA: Diagnosis not present

## 2018-11-19 LAB — BASIC METABOLIC PANEL
Anion gap: 10 (ref 5–15)
BUN: 14 mg/dL (ref 8–23)
CO2: 22 mmol/L (ref 22–32)
Calcium: 8.9 mg/dL (ref 8.9–10.3)
Chloride: 106 mmol/L (ref 98–111)
Creatinine, Ser: 0.74 mg/dL (ref 0.44–1.00)
GFR calc Af Amer: >60 mL/min (ref >60)
GFR calc non Af Amer: >60 mL/min (ref >60)
Glucose, Bld: 176 mg/dL — ABNORMAL HIGH (ref 70–99)
Potassium: 4.3 mmol/L (ref 3.5–5.1)
Sodium: 138 mmol/L (ref 135–145)

## 2018-11-19 LAB — NOVEL CORONAVIRUS, NAA (HOSP ORDER, SEND-OUT TO REF LAB; TAT 18-24 HRS): SARS-CoV-2, NAA: NOT DETECTED

## 2018-11-19 LAB — PROTIME-INR
INR: 1.8 — ABNORMAL HIGH (ref 0.8–1.2)
Prothrombin Time: 20.5 seconds — ABNORMAL HIGH (ref 11.4–15.2)

## 2018-11-19 LAB — GLUCOSE, CAPILLARY
Glucose-Capillary: 135 mg/dL — ABNORMAL HIGH (ref 70–99)
Glucose-Capillary: 226 mg/dL — ABNORMAL HIGH (ref 70–99)

## 2018-11-19 MED ORDER — OXYCODONE HCL 5 MG PO TABA: 1.0000 | ORAL_TABLET | Freq: Three times a day (TID) | ORAL | 0 refills | Status: DC | PRN

## 2018-11-19 MED ORDER — POTASSIUM CHLORIDE CRYS ER 20 MEQ PO TBCR: 40.0000 meq | EXTENDED_RELEASE_TABLET | Freq: Every day | ORAL | 0 refills | Status: DC

## 2018-11-19 MED ORDER — METOPROLOL SUCCINATE ER 25 MG PO TB24: 37.5000 mg | ORAL_TABLET | Freq: Every day | ORAL | Status: DC

## 2018-11-19 MED ORDER — GABAPENTIN 300 MG PO CAPS: 300.0000 mg | ORAL_CAPSULE | Freq: Three times a day (TID) | ORAL | Status: DC

## 2018-11-19 MED ORDER — WARFARIN SODIUM 10 MG PO TABS: 10.0000 mg | ORAL_TABLET | Freq: Once | ORAL | Status: DC

## 2018-11-19 MED ORDER — FUROSEMIDE 20 MG PO TABS: 20.0000 mg | ORAL_TABLET | Freq: Every day | ORAL | Status: DC

## 2018-11-19 MED ORDER — SPIRONOLACTONE 25 MG PO TABS: 12.5000 mg | ORAL_TABLET | Freq: Every day | ORAL | Status: DC

## 2018-11-19 MED ORDER — ALPRAZOLAM 0.25 MG PO TABS: 0.2500 mg | ORAL_TABLET | Freq: Three times a day (TID) | ORAL | 0 refills | Status: DC

## 2018-11-19 NOTE — Discharge Summary (Signed)
Physician Discharge Summary  Donna [name] TGG:[id number] DOB: April 27, 1950 DOA: 11/12/2018  PCP: Janine Limbo, PA-C  Admit date: 11/12/2018 Discharge date: 11/19/2018  Time spent: 45 minutes  Recommendations for Outpatient Follow-up:  1. Due to follow-up with neurology in [facility] for chronic left leg weakness, needs nerve conduction studies 2. Cardiology in 2 weeks, with labs 3. PCP in 1 week, please check be met at follow-up   Discharge Diagnoses:  Principal Problem:   Hypokalemia Morbid obesity Chronic leg weakness:   Chronic diastolic CHF (congestive heart failure) (HCC)   Depression   Atrial fibrillation with RVR (HCC)   Type 2 diabetes mellitus (HCC)   Chronic atrial fibrillation with RVR   Leukocytosis   Orthostatic hypotension   Discharge Condition: Stable  Diet recommendation: Low-sodium heart healthy diabetic  Filed Weights   11/12/18 1727 11/13/18 0614  Weight: 117.9 kg 118.1 kg    History of present illness:  68 year old with past medical history significant for A. fib on Coumadin and amiodarone, chronic diastolic heart failure diabetes, diabetic lumbosacral plexopathy, OSA on CPAP, restless leg syndrome who presented with generalized weakness.  She reported frequent falls at home, become more nonambulatory over 65mhs -1year, she has been taking Lasix and metolazone, recently stopped taking the spironolactone as advised by her cardiology.  She presented with severe hypokalemia and EKG with prolonged QT.  [facility]    1-Severe hypokalemia: despite taking 1271m KCL daily. -From long-term loop diuretic therapy -Potassium improved on larger replacement doses, Aldactone -Improved now, with lower doses of Lasix, discontinuing metolazone by cardiology and renal, Aldactone resumption. -Stable on Lasix 20 mg daily, Aldactone 12.5 mg, KCl 40 mEq daily -Needs volume status reassessed at cardiology follow-up and labs rechecked in 1  week -Urology follow-up set up  2-A.Fib  RVR, chronic: She was not on rate control medications due to blood pressure issues.  She was treated initially with IV Cardizem drip, subsequently stopped, started on low-dose Toprol. She was previously taking amiodarone but it was discontinued by her provider. -Heart rate improved, currently on Toprol XL 37.5 mg daily -Coumadin continued  3-Chronic diastolic heart failure; venous insufficiency Cardiology adjusted medications.  Metolazone stopped due to hypotension. -Lasix restarted 20 mg daily and Aldactone 12.5 mg daily -Per cardiology,  -appears close to euvolemic  4-Prolonged QT; improved after potassium supplementation.  5-Anxiety depression: Continue with buspirone and Xanax.  6-Hyperlipidemia: Continue with Zetia  7-History of restless leg syndrome: Continue with pramipexole  8-Diabetes type 2: Continue with insulin.  9-Frequent fall: She will need PT OT will probably require placement.  10-chronic low back pain, chronic left leg and generalized weakness -Patient reports that this is been ongoing for 3 to 6 months, spouse says 6 months to 1 year  -[facility] and recently saw a neurosurgeon, had MRI done which noted severe DJD at L4-L5 and L5-S1 but could not explain her chronic left leg weakness, [place], needs nerve conduction studies. -In addition also has peripheral neuropathy from longstanding diabetes which is contributing to her gait problems as well -Plan for short-term rehab now  -Home regimen of oxycodone and gabapentin resumed, be used smaller dose of gabapentin due to drowsiness  11.  Short term memory deficits, cognitive decline -Prominent memory deficits noted, struggles with providing meaningful history on a consistent basis -Early dementia is a possibility that the other  component is that she is on numerous  anxiety and pain medications for her chronic pain arthritis and severe anxiety -Gabapentin dose decreased as discussed above, caution with polypharmacy  Pressure Injury 09/29/17 Unstageable - Full thickness tissue loss in which the base of the ulcer is covered by slough (yellow, tan, gray, green or brown) and/or eschar (tan, brown or black) in the wound bed. (Active)  09/29/17 1426  Location: Sacrum  Location Orientation: Mid  Staging: Unstageable - Full thickness tissue loss in which the base of the ulcer is covered by slough (yellow, tan, gray, green or brown) and/or eschar (tan, brown or black) in the wound bed.  Wound Description (Comments):   Present on Admission:     Estimated body mass index is 36.31 kg/m as calculated from the following:   Height as of this encounter: '5\' 11"'$  (1.803 m).   Weight as of this encounter: 118.1 kg.      Discharge Exam: Vitals:   11/19/18 0333 11/19/18 1047  BP: (!) 142/78 (!) 153/85  Pulse: 77 98  Resp: 18 19  Temp: 98.3 F (36.8 C)   SpO2: 94%     General: AAOx2, morbidly obese Cardiovascular: S1S2/RRR Respiratory: CTAB  Discharge Instructions   Discharge Instructions    Diet - low sodium heart healthy   Complete by: As directed    Increase activity slowly   Complete by: As directed      Allergies as of 11/19/2018      Reactions   Bee Venom Hives   Takes benadryl   Other Other (See Comments)   Raw foods with seeds give her "boils".  Avoids raw strawberries, blueberries. Tolerates cooked fruits, tomato sauce, bread/grains with seeds. [facility] 10/17/13 Berries with seeds Allergy to glutamine-C-quercet-selen-brom per [facility] 09/25/17   Alteplase Rash   Cefepime Rash   Fentanyl Other (See Comments)   Hallucinations, syncope     Metformin And Related Diarrhea      Medication List    STOP taking these medications   amiodarone 200 MG tablet Commonly known as: PACERONE   ciprofloxacin 250 MG tablet Commonly known as: CIPRO    gabapentin 600 MG tablet Commonly known as: NEURONTIN Replaced by: gabapentin 300 MG capsule   metolazone 10 MG tablet Commonly known as: ZAROXOLYN     TAKE these medications   acetaminophen 500 MG tablet Commonly known as: TYLENOL Take 500 mg by mouth every 6 (six) hours as needed for mild pain.   ALPRAZolam 0.25 MG tablet Commonly known as: XANAX Take 1 tablet (0.25 mg total) by mouth 3 (three) times daily.   busPIRone 10 MG tablet Commonly known as: BUSPAR Take 10 mg by mouth 2 (two) times daily.   docusate sodium 100 MG capsule Commonly known as: COLACE Take 100 mg by mouth daily as needed for mild constipation.   ezetimibe 10 MG tablet Commonly known as: ZETIA Take 10 mg by mouth at bedtime.   ferrous sulfate 325 (65 FE) MG tablet Take 325 mg by mouth See admin instructions. Take one tablet (325 mg) by mouth on Sunday, Monday, Wednesday and Friday mornings.   furosemide 20 MG tablet Commonly known as: LASIX Take 1 tablet (20 mg total) by mouth daily. What changed:   medication strength  how much to take   gabapentin 300 MG capsule Commonly known as: NEURONTIN Take 1 capsule (300 mg total) by mouth 3 (three) times daily. Replaces: gabapentin 600 MG tablet   guaiFENesin 600 MG 12 hr tablet Commonly known  as: MUCINEX Take 600 mg by mouth 2 (two) times daily as needed for cough or to loosen phlegm.   HumaLOG KwikPen 100 UNIT/ML KiwkPen Generic drug: insulin lispro Inject 6-14 Units into the skin See admin instructions. Inject 6-14 units subcutaneously before each meal per sliding scale: CBG 201-250 6 units, 251-300 8 units, 301-350 10 units, 351-400 12 units, over 400 14 units and call MD   insulin detemir 100 UNIT/ML injection Commonly known as: LEVEMIR Inject 0.4 mLs (40 Units total) into the skin at bedtime. What changed: how much to take   lidocaine 5 % Commonly known as: LIDODERM Place 1 patch onto the skin daily as needed (right hip pain). Remove  & Discard patch within 12 hours or as directed by MD   magnesium oxide 400 MG tablet Commonly known as: MAG-OX Take 1 tablet (400 mg total) by mouth 3 (three) times daily. ALSO TAKE A EXTRA 400 MG TABLET ON THE DAYS YOU TAKE ZAROXOLYN TABLET   Melatonin 5 MG Caps Take 5 mg by mouth at bedtime.   metoprolol succinate 25 MG 24 hr tablet Commonly known as: TOPROL-XL Take 1.5 tablets (37.5 mg total) by mouth daily. Start taking on: November 20, 2018   omeprazole 20 MG capsule Commonly known as: PRILOSEC Take 20 mg by mouth daily.   ondansetron 4 MG tablet Commonly known as: ZOFRAN Take 1 tablet (4 mg total) by mouth every 6 (six) hours as needed for nausea.   OxyCODONE HCl (Abuse Deter) 5 MG [place] Commonly known as: OXAYDO Take 1 tablet by mouth 3 (three) times daily as needed (pain). What changed: reasons to take this   potassium chloride SA 20 MEQ tablet Commonly known as: K-DUR Take 2 tablets (40 mEq total) by mouth daily. TAKE AN EXTRA 20 MEQ ON THE DAYS YOU TAKE ZAROXLYN What changed: how much to take   pramipexole 0.125 MG tablet Commonly known as: MIRAPEX Take 0.125 mg by mouth at bedtime.   sertraline 100 MG tablet Commonly known as: ZOLOFT Take 100 mg by mouth daily.   spironolactone 25 MG tablet Commonly known as: ALDACTONE Take 0.5 tablets (12.5 mg total) by mouth daily. Start taking on: November 20, 2018 What changed: how much to take   tiZANidine 4 MG tablet Commonly known as: ZANAFLEX Take 4 mg by mouth 3 (three) times daily.   Trulicity 1.5 TZ/0.0FV Sopn Generic drug: Dulaglutide Inject 1.5 mg into the skin every Saturday.   VENLAFAXINE HCL ER PO Take 100 mg by mouth daily with breakfast.   vitamin C 500 MG tablet Commonly known as: ASCORBIC ACID Take 500 mg by mouth daily.   warfarin 7.5 MG tablet Commonly known as: COUMADIN Take 5-7.5 mg by mouth daily. Take 7.5 mg on Sunday and Wednesday and 5 mg all other days      Allergies   Allergen Reactions  . Bee Venom Hives    Takes benadryl  . Other Other (See Comments)    Raw foods with seeds give her "boils".  Avoids raw strawberries, blueberries. Tolerates cooked fruits, tomato sauce, bread/grains with seeds. [facility] 10/17/13 Berries with seeds Allergy to glutamine-C-quercet-selen-brom per [facility] 09/25/17  . Alteplase Rash  . Cefepime Rash  . Fentanyl Other (See Comments)    Hallucinations, syncope    . Metformin And Related Diarrhea   Follow-up Information    Lendon Colonel, NP Follow up.   Specialties: Nurse Practitioner, Radiology, Cardiology Why: [facility] - see appointment information below for 11/29/18 with Curt Bears,  who is one of the nurse practitioners who works with our cardiology team. Contact information: [address] [place] [address] [phone]        [place], Alvis Lemmings, PA-C. Schedule an appointment as soon as possible for a visit in 1 week(s).   Specialty: Internal Medicine Contact information: [address] [place] [address] [phone]        Leonie Man, MD .   Specialty: Cardiology Contact information: [address] [place] [address] [phone]        Neurlogy Follow up in 2 week(s).   Why: In [place] as previously scheduled           The results of significant diagnostics from this hospitalization (including imaging, microbiology, ancillary and laboratory) are listed below for reference.    Significant Diagnostic Studies: Dg Chest 2 View  Result Date: 11/12/2018 CLINICAL DATA:  Weakness for 6 weeks. EXAM: CHEST - 2 VIEW COMPARISON:  09/27/2017 and prior radiographs FINDINGS: UPPER limits normal heart size and mild hilar prominence unchanged from remote studies. There is no evidence of focal airspace disease, pulmonary edema, suspicious pulmonary nodule/mass, pleural effusion, or pneumothorax. No acute bony abnormalities are identified. IMPRESSION: No active  cardiopulmonary disease. Electronically Signed   By: Margarette Canada M.D.   On: 11/12/2018 18:49   Dg Pelvis 1-2 Views  Result Date: 11/16/2018 CLINICAL DATA:  Hip pain EXAM: PELVIS - 1-2 VIEW COMPARISON:  09/03/2017 FINDINGS: Examination is significantly limited secondary to poor penetration from patient body habitus. Right total hip arthroplasty, grossly intact. Degenerative changes of the left hip and bilateral SI joints. No evidence of fracture or diastasis. Sacrum and right iliac bone are largely obscured by overlying bowel gas. IMPRESSION: Limited exam.  No obvious osseous abnormality. Electronically Signed   By: Davina Poke M.D.   On: 11/16/2018 14:23    Microbiology: Recent Results (from the past 240 hour(s))  SARS Coronavirus 2 [facility] order, Performed in [facility] hospital lab) Nasopharyngeal Nasopharyngeal Swab     Status: None   Collection Time: 11/12/18 11:42 PM   Specimen: Nasopharyngeal Swab  Result Value Ref Range Status   SARS Coronavirus 2 NEGATIVE NEGATIVE Final    Comment: (NOTE) If result is NEGATIVE SARS-CoV-2 target nucleic acids are NOT DETECTED. The SARS-CoV-2 RNA is generally detectable in upper and lower  respiratory specimens during the acute phase of infection. The lowest  concentration of SARS-CoV-2 viral copies this assay can detect is 250  copies / mL. A negative result does not preclude SARS-CoV-2 infection  and should not be used as the sole basis for treatment or other  patient management decisions.  A negative result may occur with  improper specimen collection / handling, submission of specimen other  than nasopharyngeal swab, presence of viral mutation(s) within the  areas targeted by this assay, and inadequate number of viral copies  (<250 copies / mL). A negative result must be combined with clinical  observations, patient history, and epidemiological information. If result is POSITIVE SARS-CoV-2 target nucleic acids are DETECTED. The  SARS-CoV-2 RNA is generally detectable in upper and lower  respiratory specimens dur ing the acute phase of infection.  Positive  results are indicative of active infection with SARS-CoV-2.  Clinical  correlation with patient history and other diagnostic information is  necessary to determine patient infection status.  Positive results do  not rule out bacterial infection or co-infection with other viruses. If result is PRESUMPTIVE POSTIVE SARS-CoV-2  nucleic acids MAY BE PRESENT.   A presumptive positive result was obtained on the submitted specimen  and confirmed on repeat testing.  While 2019 novel coronavirus  (SARS-CoV-2) nucleic acids may be present in the submitted sample  additional confirmatory testing may be necessary for epidemiological  and / or clinical management purposes  to differentiate between  SARS-CoV-2 and other Sarbecovirus currently known to infect humans.  If clinically indicated additional testing with an alternate test  methodology [phone]) is advised. The SARS-CoV-2 RNA is generally  detectable in upper and lower respiratory sp ecimens during the acute  phase of infection. The expected result is Negative. Fact Sheet for Patients:  StrictlyIdeas.no Fact Sheet for Healthcare Providers: BankingDealers.co.za This test is not yet approved or cleared by the [place] FDA and has been authorized for detection and/or diagnosis of SARS-CoV-2 by FDA under an Emergency Use Authorization (EUA).  This EUA will remain in effect (meaning this test can be used) for the duration of the COVID-19 declaration under Section 564(b)(1) of the Act, 21 U.S.C. section 360bbb-3(b)(1), unless the authorization is terminated or revoked sooner. Performed at [facility], [place] [address], [place], [place] [address]   Novel Coronavirus, NAA (hospital order; send-out to ref lab)     Status: None   Collection Time: 11/18/18  3:36 PM    Specimen: Nasopharyngeal Swab; Respiratory  Result Value Ref Range Status   SARS-CoV-2, NAA NOT DETECTED NOT DETECTED Final    Comment: (NOTE) This nucleic acid amplification test was developed and its performance characteristics determined by [employer]. Nucleic acid amplification tests include PCR and TMA. This test has not been FDA cleared or approved. This test has been authorized by FDA under an Emergency Use Authorization (EUA). This test is only authorized for the duration of time the declaration that circumstances exist justifying the authorization of the emergency use of in vitro diagnostic tests for detection of SARS-CoV-2 virus and/or diagnosis of COVID-19 infection under section 564(b)(1) of the Act, 21 U.S.C. 025KYH-0(W) (1), unless the authorization is terminated or revoked sooner. When diagnostic testing is negative, the possibility of a false negative result should be considered in the context of a patient's recent exposures and the presence of clinical signs and symptoms consistent with COVID-19. An individual without symptoms of COVID- 19 and who is not shedding SARS-CoV-2 vi rus would expect to have a negative (not detected) result in this assay. Performed At: [facility] [address] [place], [place] [id number] Rush Farmer MD [id number]    [place]  Final    Comment: Performed at [facility], [place] [address] [place], [place] [address]     Labs: Basic Metabolic Panel: Recent Labs  Lab 11/13/18 0459  11/15/18 0438 11/16/18 0752 11/17/18 0240 11/18/18 0321 11/19/18 0218  NA 134*   < > 139 141 137 137 138  K 2.2*   < > 3.5 3.0* 4.3 5.1 4.3  CL 93*   < > 103 102 105 105 106  CO2 27   < > '26 28 24 24 22  '$ GLUCOSE 314*   < > 171* 102* 202* 185* 176*  BUN 20   < > '16 14 17 15 14  '$ CREATININE 0.99   < > 0.74 0.71 0.70 0.76 0.74  CALCIUM 8.6*   < > 8.7* 8.7* 8.7* 8.7* 8.9  MG 2.1  --   --   --   --    --   --    < > =  values in this interval not displayed.   Liver Function Tests: No results for input(s): AST, ALT, ALKPHOS, BILITOT, PROT, ALBUMIN in the last 168 hours. No results for input(s): LIPASE, AMYLASE in the last 168 hours. No results for input(s): AMMONIA in the last 168 hours. CBC: Recent Labs  Lab 11/13/18 0459 11/14/18 0221 11/16/18 0752 11/17/18 0240 11/18/18 0321  WBC 11.6* 7.6 8.9 6.4 6.3  HGB 12.5 11.0* 11.8* 10.1* 9.6*  HCT 40.3 35.4* 37.8 33.8* 32.9*  MCV 80.9 81.2 83.8 84.5 84.8  PLT 195 162 177 152 158   Cardiac Enzymes: No results for input(s): CKTOTAL, CKMB, CKMBINDEX, TROPONINI in the last 168 hours. BNP: BNP (last 3 results) No results for input(s): BNP in the last 8760 hours.  ProBNP (last 3 results) No results for input(s): PROBNP in the last 8760 hours.  CBG: Recent Labs  Lab 11/18/18 1427 11/18/18 1620 11/18/18 2133 11/19/18 0632 11/19/18 1127  GLUCAP 178* 146* 146* 135* 226*       Signed:  Domenic Polite MD.  [facility] 11/19/2018, 11:40 AM

## 2018-11-19 NOTE — TOC Transition Note (Signed)
Transition of Care [facility]) - CM/SW Discharge Note   Patient Details  Name: Donna [name] MRN: [id number] Date of Birth: 1950-05-16  Transition of [facility]) CM/SW Contact:  Vinie Sill, [place] Phone Number: 11/19/2018, 1:19 PM   Clinical Narrative:     Patient will DC to: [place]  DC Date: 11/19/2018 Family Notified: Broadus John, spouse  Transport By: Corey Harold @ 2pm  RN, patient, and facility notified of DC. Discharge Summary sent to facility. RN given number for [phone], Room 120. Ambulance transport requested for patient.   Clinical Social Worker signing off. Thurmond Butts, MSW, [facility] Clinical Social Worker [phone]   Final next level of care: Skilled Nursing Facility Barriers to Discharge: Barriers Resolved   Patient Goals and CMS Choice Patient states their goals for this hospitalization and ongoing recovery are:: go home and walk and help myself and get around [facility] Medicare.gov Compare Post Acute Care list provided to:: Patient(spouse) Choice offered to / list presented to : Patient  Discharge Placement              Patient chooses bed at: [facility]) Patient to be transferred to facility by: [place] Name of family member notified: Broadus John, spouse Patient and family notified of of transfer: 11/19/18  Discharge Plan and Services In-house Referral: Clinical Social Work                                   Social Determinants of Health ([place]) Interventions     Readmission Risk Interventions No flowsheet data found.

## 2018-11-19 NOTE — Care Management Important Message (Signed)
Important Message  Patient Details  Name: Donna [name] MRN: [id number] Date of Birth: January 16, 1951   Medicare Important Message Given:  Yes     [name] 11/19/2018, 1:18 PM

## 2018-11-19 NOTE — Progress Notes (Signed)
Pt evening blood sugar was 146. Paged provider to see if Levemir 60 units was appropriate for administration. Given verbal order by provider to administer half (30 units) Levemir to patient. 30 units given in abdomen. Will continue to monitor. Lajoyce Corners, RN

## 2018-11-19 NOTE — Progress Notes (Signed)
ANTICOAGULATION CONSULT NOTE - Follow Up Consult  Pharmacy Consult for Warfarin Indication: atrial fibrillation  Allergies  Allergen Reactions  . Bee Venom Hives    Takes benadryl  . Other Other (See Comments)    Raw foods with seeds give her "boils".  Avoids raw strawberries, blueberries. Tolerates cooked fruits, tomato sauce, bread/grains with seeds. [facility] 10/17/13 Berries with seeds Allergy to glutamine-C-quercet-selen-brom per [facility] 09/25/17  . Alteplase Rash  . Cefepime Rash  . Fentanyl Other (See Comments)    Hallucinations, syncope    . Metformin And Related Diarrhea    Patient Measurements: Height: 5\' 11"  (180.3 cm) Weight: 260 lb 5.8 oz (118.1 kg) IBW/kg (Calculated) : 70.8  Vital Signs: Temp: 98.3 F (36.8 C) (09/18 0333) Temp Source: Oral (09/18 0333) BP: 142/78 (09/18 0333) Pulse Rate: 77 (09/18 0333)  Labs: Recent Labs    11/17/18 0240 11/18/18 0321 11/19/18 0218  HGB 10.1* 9.6*  --   HCT 33.8* 32.9*  --   PLT 152 158  --   LABPROT 22.2* 19.5* 20.5*  INR 2.0* 1.7* 1.8*  CREATININE 0.70 0.76 0.74    Estimated Creatinine Clearance: 96.6 mL/min (by C-G formula based on SCr of 0.74 mg/dL).   Assessment:  Anticoag: Warfarin PTA for afib. INR 1.8. Hgb 11.8>>10.1>>9.6. Plts WNL. Doses have been charted -Coumadin 5 mg daily except 7.5 mg SunWed PTA. Pt's INR on admit was 2.4  Goal of Therapy:  INR 2-3 Monitor platelets by anticoagulation protocol: Yes   Plan:  Give Coumadin 10mg  po x 1 again tonight Daily INR   Makenzye Troutman S. Alford Highland, PharmD, BCPS Clinical Staff Pharmacist Eilene Ghazi Stillinger 11/19/2018,10:26 AM

## 2018-11-19 NOTE — Progress Notes (Signed)
Pt discharged to [facility], per order. Report called to receiving nurse. All questions were answered. Pt left with all of her belongings.

## 2018-11-20 LAB — URINE CULTURE: Culture: >=100000 — AB

## 2018-11-23 DIAGNOSIS — E876 Hypokalemia: Secondary | ICD-10-CM | POA: Diagnosis not present

## 2018-11-23 DIAGNOSIS — I5033 Acute on chronic diastolic (congestive) heart failure: Secondary | ICD-10-CM | POA: Diagnosis not present

## 2018-11-23 DIAGNOSIS — I482 Chronic atrial fibrillation, unspecified: Secondary | ICD-10-CM | POA: Diagnosis not present

## 2018-11-23 DIAGNOSIS — R5381 Other malaise: Secondary | ICD-10-CM | POA: Diagnosis not present

## 2018-11-24 DIAGNOSIS — M6281 Muscle weakness (generalized): Secondary | ICD-10-CM | POA: Diagnosis not present

## 2018-11-24 DIAGNOSIS — E1140 Type 2 diabetes mellitus with diabetic neuropathy, unspecified: Secondary | ICD-10-CM | POA: Diagnosis not present

## 2018-11-24 DIAGNOSIS — Z7409 Other reduced mobility: Secondary | ICD-10-CM | POA: Diagnosis not present

## 2018-11-24 DIAGNOSIS — L98411 Non-pressure chronic ulcer of buttock limited to breakdown of skin: Secondary | ICD-10-CM | POA: Diagnosis not present

## 2018-11-26 DIAGNOSIS — G894 Chronic pain syndrome: Secondary | ICD-10-CM | POA: Diagnosis not present

## 2018-11-26 DIAGNOSIS — E876 Hypokalemia: Secondary | ICD-10-CM | POA: Diagnosis not present

## 2018-11-26 DIAGNOSIS — R300 Dysuria: Secondary | ICD-10-CM | POA: Diagnosis not present

## 2018-11-26 DIAGNOSIS — I482 Chronic atrial fibrillation, unspecified: Secondary | ICD-10-CM | POA: Diagnosis not present

## 2018-11-29 ENCOUNTER — Ambulatory Visit: Payer: Medicare Other | Admitting: Adult Health

## 2018-12-03 DIAGNOSIS — E876 Hypokalemia: Secondary | ICD-10-CM | POA: Diagnosis not present

## 2018-12-03 DIAGNOSIS — G894 Chronic pain syndrome: Secondary | ICD-10-CM | POA: Diagnosis not present

## 2018-12-03 DIAGNOSIS — N390 Urinary tract infection, site not specified: Secondary | ICD-10-CM | POA: Diagnosis not present

## 2018-12-03 DIAGNOSIS — I482 Chronic atrial fibrillation, unspecified: Secondary | ICD-10-CM | POA: Diagnosis not present

## 2018-12-06 DIAGNOSIS — Z7901 Long term (current) use of anticoagulants: Secondary | ICD-10-CM | POA: Diagnosis not present

## 2018-12-13 DIAGNOSIS — I482 Chronic atrial fibrillation, unspecified: Secondary | ICD-10-CM | POA: Diagnosis not present

## 2018-12-13 DIAGNOSIS — E876 Hypokalemia: Secondary | ICD-10-CM | POA: Diagnosis not present

## 2018-12-13 DIAGNOSIS — N390 Urinary tract infection, site not specified: Secondary | ICD-10-CM | POA: Diagnosis not present

## 2018-12-13 DIAGNOSIS — G894 Chronic pain syndrome: Secondary | ICD-10-CM | POA: Diagnosis not present

## 2018-12-21 DIAGNOSIS — E876 Hypokalemia: Secondary | ICD-10-CM | POA: Diagnosis not present

## 2018-12-21 DIAGNOSIS — G894 Chronic pain syndrome: Secondary | ICD-10-CM | POA: Diagnosis not present

## 2018-12-21 DIAGNOSIS — R5381 Other malaise: Secondary | ICD-10-CM | POA: Diagnosis not present

## 2018-12-21 DIAGNOSIS — E785 Hyperlipidemia, unspecified: Secondary | ICD-10-CM | POA: Diagnosis not present

## 2018-12-21 DIAGNOSIS — F331 Major depressive disorder, recurrent, moderate: Secondary | ICD-10-CM | POA: Diagnosis not present

## 2018-12-21 DIAGNOSIS — F5105 Insomnia due to other mental disorder: Secondary | ICD-10-CM | POA: Diagnosis not present

## 2018-12-23 DIAGNOSIS — G894 Chronic pain syndrome: Secondary | ICD-10-CM | POA: Diagnosis not present

## 2018-12-23 DIAGNOSIS — I482 Chronic atrial fibrillation, unspecified: Secondary | ICD-10-CM | POA: Diagnosis not present

## 2018-12-23 DIAGNOSIS — E876 Hypokalemia: Secondary | ICD-10-CM | POA: Diagnosis not present

## 2018-12-23 DIAGNOSIS — R5381 Other malaise: Secondary | ICD-10-CM | POA: Diagnosis not present

## 2018-12-29 DIAGNOSIS — E1140 Type 2 diabetes mellitus with diabetic neuropathy, unspecified: Secondary | ICD-10-CM | POA: Diagnosis not present

## 2018-12-29 DIAGNOSIS — L98411 Non-pressure chronic ulcer of buttock limited to breakdown of skin: Secondary | ICD-10-CM | POA: Diagnosis not present

## 2018-12-29 DIAGNOSIS — M6281 Muscle weakness (generalized): Secondary | ICD-10-CM | POA: Diagnosis not present

## 2018-12-29 DIAGNOSIS — Z7409 Other reduced mobility: Secondary | ICD-10-CM | POA: Diagnosis not present

## 2019-01-07 DIAGNOSIS — U071 COVID-19: Secondary | ICD-10-CM | POA: Diagnosis not present

## 2019-01-08 DIAGNOSIS — M6281 Muscle weakness (generalized): Secondary | ICD-10-CM | POA: Diagnosis not present

## 2019-01-08 DIAGNOSIS — Z7409 Other reduced mobility: Secondary | ICD-10-CM | POA: Diagnosis not present

## 2019-01-08 DIAGNOSIS — E1140 Type 2 diabetes mellitus with diabetic neuropathy, unspecified: Secondary | ICD-10-CM | POA: Diagnosis not present

## 2019-01-08 DIAGNOSIS — L98411 Non-pressure chronic ulcer of buttock limited to breakdown of skin: Secondary | ICD-10-CM | POA: Diagnosis not present

## 2019-01-14 DIAGNOSIS — E1140 Type 2 diabetes mellitus with diabetic neuropathy, unspecified: Secondary | ICD-10-CM | POA: Diagnosis not present

## 2019-01-14 DIAGNOSIS — Z7409 Other reduced mobility: Secondary | ICD-10-CM | POA: Diagnosis not present

## 2019-01-14 DIAGNOSIS — M6281 Muscle weakness (generalized): Secondary | ICD-10-CM | POA: Diagnosis not present

## 2019-01-14 DIAGNOSIS — L98411 Non-pressure chronic ulcer of buttock limited to breakdown of skin: Secondary | ICD-10-CM | POA: Diagnosis not present

## 2019-01-18 ENCOUNTER — Ambulatory Visit: Payer: Self-pay | Admitting: Diagnostic Neuroimaging

## 2019-01-21 DIAGNOSIS — Z7409 Other reduced mobility: Secondary | ICD-10-CM | POA: Diagnosis not present

## 2019-01-21 DIAGNOSIS — M6281 Muscle weakness (generalized): Secondary | ICD-10-CM | POA: Diagnosis not present

## 2019-01-21 DIAGNOSIS — L98411 Non-pressure chronic ulcer of buttock limited to breakdown of skin: Secondary | ICD-10-CM | POA: Diagnosis not present

## 2019-01-21 DIAGNOSIS — E1140 Type 2 diabetes mellitus with diabetic neuropathy, unspecified: Secondary | ICD-10-CM | POA: Diagnosis not present

## 2019-01-26 ENCOUNTER — Other Ambulatory Visit: Payer: Self-pay

## 2019-01-26 DIAGNOSIS — U071 COVID-19: Secondary | ICD-10-CM | POA: Diagnosis not present

## 2019-01-26 DIAGNOSIS — F5105 Insomnia due to other mental disorder: Secondary | ICD-10-CM | POA: Diagnosis not present

## 2019-01-26 DIAGNOSIS — F331 Major depressive disorder, recurrent, moderate: Secondary | ICD-10-CM | POA: Diagnosis not present

## 2019-01-26 DIAGNOSIS — L89309 Pressure ulcer of unspecified buttock, unspecified stage: Secondary | ICD-10-CM | POA: Diagnosis not present

## 2019-01-26 DIAGNOSIS — K219 Gastro-esophageal reflux disease without esophagitis: Secondary | ICD-10-CM | POA: Diagnosis not present

## 2019-01-28 ENCOUNTER — Emergency Department (HOSPITAL_COMMUNITY): Payer: Medicare Other

## 2019-01-28 ENCOUNTER — Encounter (HOSPITAL_COMMUNITY): Payer: Self-pay

## 2019-01-28 ENCOUNTER — Other Ambulatory Visit: Payer: Self-pay

## 2019-01-28 ENCOUNTER — Emergency Department (HOSPITAL_COMMUNITY)
Admission: EM | Admit: 2019-01-28 | Discharge: 2019-01-28 | Disposition: A | Discharge status: Home / Self Care | Payer: Medicare Other | Attending: Emergency Medicine | Admitting: Emergency Medicine

## 2019-01-28 DIAGNOSIS — X838XXA Intentional self-harm by other specified means, initial encounter: Secondary | ICD-10-CM | POA: Insufficient documentation

## 2019-01-28 DIAGNOSIS — I5032 Chronic diastolic (congestive) heart failure: Secondary | ICD-10-CM | POA: Insufficient documentation

## 2019-01-28 DIAGNOSIS — E119 Type 2 diabetes mellitus without complications: Secondary | ICD-10-CM | POA: Diagnosis not present

## 2019-01-28 DIAGNOSIS — R079 Chest pain, unspecified: Secondary | ICD-10-CM | POA: Diagnosis not present

## 2019-01-28 DIAGNOSIS — I110 Hypertensive heart disease with heart failure: Secondary | ICD-10-CM | POA: Diagnosis not present

## 2019-01-28 DIAGNOSIS — Z7901 Long term (current) use of anticoagulants: Secondary | ICD-10-CM | POA: Diagnosis not present

## 2019-01-28 DIAGNOSIS — Z96651 Presence of right artificial knee joint: Secondary | ICD-10-CM | POA: Diagnosis not present

## 2019-01-28 DIAGNOSIS — Z96641 Presence of right artificial hip joint: Secondary | ICD-10-CM | POA: Insufficient documentation

## 2019-01-28 DIAGNOSIS — N3001 Acute cystitis with hematuria: Secondary | ICD-10-CM

## 2019-01-28 DIAGNOSIS — Z79899 Other long term (current) drug therapy: Secondary | ICD-10-CM | POA: Diagnosis not present

## 2019-01-28 DIAGNOSIS — R509 Fever, unspecified: Secondary | ICD-10-CM | POA: Diagnosis present

## 2019-01-28 DIAGNOSIS — R0602 Shortness of breath: Secondary | ICD-10-CM | POA: Diagnosis not present

## 2019-01-28 LAB — CBC WITH DIFFERENTIAL/PLATELET
Abs Immature Granulocytes: 0.03 10*3/uL (ref 0.00–0.07)
Basophils Absolute: 0 10*3/uL (ref 0.0–0.1)
Basophils Relative: 0 %
Eosinophils Absolute: 0.1 10*3/uL (ref 0.0–0.5)
Eosinophils Relative: 1 %
HCT: 36.3 % (ref 36.0–46.0)
Hemoglobin: 10.6 g/dL — ABNORMAL LOW (ref 12.0–15.0)
Immature Granulocytes: 0 %
Lymphocytes Relative: 18 %
Lymphs Abs: 1.4 10*3/uL (ref 0.7–4.0)
MCH: 23 pg — ABNORMAL LOW (ref 26.0–34.0)
MCHC: 29.2 g/dL — ABNORMAL LOW (ref 30.0–36.0)
MCV: 78.9 fL — ABNORMAL LOW (ref 80.0–100.0)
Monocytes Absolute: 0.9 10*3/uL (ref 0.1–1.0)
Monocytes Relative: 11 %
Neutro Abs: 5.3 10*3/uL (ref 1.7–7.7)
Neutrophils Relative %: 70 %
Platelets: 206 10*3/uL (ref 150–400)
RBC: 4.6 MIL/uL (ref 3.87–5.11)
RDW: 17.9 % — ABNORMAL HIGH (ref 11.5–15.5)
WBC: 7.7 10*3/uL (ref 4.0–10.5)
nRBC: 0 % (ref 0.0–0.2)

## 2019-01-28 LAB — URINALYSIS, ROUTINE W REFLEX MICROSCOPIC
Bilirubin Urine: NEGATIVE
Glucose, UA: NEGATIVE mg/dL
Hgb urine dipstick: NEGATIVE
Ketones, ur: NEGATIVE mg/dL
Nitrite: POSITIVE — AB
Protein, ur: 30 mg/dL — AB
Specific Gravity, Urine: 1.014 (ref 1.005–1.030)
WBC, UA: >50 WBC/hpf — ABNORMAL HIGH (ref 0–5)
pH: 7 (ref 5.0–8.0)

## 2019-01-28 LAB — COMPREHENSIVE METABOLIC PANEL
ALT: 17 U/L (ref 0–44)
AST: 26 U/L (ref 15–41)
Albumin: 3.4 g/dL — ABNORMAL LOW (ref 3.5–5.0)
Alkaline Phosphatase: 70 U/L (ref 38–126)
Anion gap: 9 (ref 5–15)
BUN: 21 mg/dL (ref 8–23)
CO2: 26 mmol/L (ref 22–32)
Calcium: 8.8 mg/dL — ABNORMAL LOW (ref 8.9–10.3)
Chloride: 102 mmol/L (ref 98–111)
Creatinine, Ser: 0.87 mg/dL (ref 0.44–1.00)
GFR calc Af Amer: >60 mL/min (ref >60)
GFR calc non Af Amer: >60 mL/min (ref >60)
Glucose, Bld: 204 mg/dL — ABNORMAL HIGH (ref 70–99)
Potassium: 3.7 mmol/L (ref 3.5–5.1)
Sodium: 137 mmol/L (ref 135–145)
Total Bilirubin: 1.2 mg/dL (ref 0.3–1.2)
Total Protein: 7.4 g/dL (ref 6.5–8.1)

## 2019-01-28 LAB — TROPONIN I (HIGH SENSITIVITY)
Troponin I (High Sensitivity): 8 ng/L (ref <18)
Troponin I (High Sensitivity): 9 ng/L (ref <18)

## 2019-01-28 LAB — PROTIME-INR
INR: 2.1 — ABNORMAL HIGH (ref 0.8–1.2)
Prothrombin Time: 23.2 seconds — ABNORMAL HIGH (ref 11.4–15.2)

## 2019-01-28 LAB — LIPASE, BLOOD: Lipase: 33 U/L (ref 11–51)

## 2019-01-28 LAB — LACTIC ACID, PLASMA: Lactic Acid, Venous: 0.7 mmol/L (ref 0.5–1.9)

## 2019-01-28 MED ORDER — LORAZEPAM 2 MG/ML IJ SOLN
1.0000 mg | Freq: Once | INTRAMUSCULAR | Status: AC
  Administered 2019-01-28: 1 mg via INTRAVENOUS
  Filled 2019-01-28: qty 1

## 2019-01-28 MED ORDER — SODIUM CHLORIDE 0.9 % IV BOLUS: 500.0000 mL | Freq: Once | INTRAVENOUS | Status: DC

## 2019-01-28 MED ORDER — SODIUM CHLORIDE 0.9 % IV SOLN
1.0000 g | Freq: Once | INTRAVENOUS | Status: AC
  Administered 2019-01-28: 1 g via INTRAVENOUS
  Filled 2019-01-28: qty 1

## 2019-01-28 MED ORDER — AMOXICILLIN 500 MG PO CAPS: 500.0000 mg | ORAL_CAPSULE | Freq: Three times a day (TID) | ORAL | 0 refills | Status: AC

## 2019-01-28 MED ORDER — SODIUM CHLORIDE 0.9 % IV BOLUS
1000.0000 mL | Freq: Once | INTRAVENOUS | Status: AC
  Administered 2019-01-28: 1000 mL via INTRAVENOUS

## 2019-01-28 MED ORDER — ACETAMINOPHEN 325 MG PO TABS
650.0000 mg | ORAL_TABLET | Freq: Once | ORAL | Status: AC
  Administered 2019-01-28: 650 mg via ORAL
  Filled 2019-01-28: qty 2

## 2019-01-28 NOTE — ED Triage Notes (Signed)
Pt arrived via EMS from [facility], pt was observed trying to commit suicide with call bell cord. Pt presents per EMS with fever 100.4, and pain and burning with  fever,pain and burning with urination. Pt was dx with Covid 19 first week in Nov.      EMS 118/70, HR 105, temp 100.4 , CBG 263

## 2019-01-28 NOTE — BH Assessment (Signed)
[place] Assessment Progress Note    Unable to assess patient until she has been medically cleared or at least has her labs back due to her age and current symptoms.

## 2019-01-28 NOTE — ED Notes (Signed)
Pt provided with dinner tray.

## 2019-01-28 NOTE — ED Notes (Signed)
Pt screaming out "Help. Help" Pt yelling that someone is sitting on her chest. PA aware. Pt very anxious. Verbal order received for 1mg  IV ativan

## 2019-01-28 NOTE — ED Triage Notes (Signed)
Pt is alert and oriented x 3. Pt denies any SI and states "I want to live I have a lot to do" . Pt denies call bell was wrapped around her throat. Pt has a stage 4 pressure  To buttock with moderate yellow drainage. Pt placed on side.

## 2019-01-28 NOTE — Discharge Instructions (Signed)
You were given a prescription for antibiotics. Please take the antibiotic prescription fully.  ° °Please follow up with your primary care provider within 5-7 days for re-evaluation of your symptoms.  ° °Please return to the emergency department for any new or worsening symptoms. ° °

## 2019-01-28 NOTE — ED Notes (Signed)
[facility] contacted for transport back to [facility].

## 2019-01-28 NOTE — ED Provider Notes (Addendum)
[facility] DEPT Provider Note   CSN: [id number] Arrival date & time: 01/28/19  1044     History   Chief Complaint Chief Complaint  Patient presents with   Fever   Suicide Attempt    HPI Donna [name] is a 68 y.o. female.     HPI   Patient is a 68 year old female with a history of CHF, chronic A. fib (on warfarin), OSA, diabetes, who presents to the ED via EMS from [facility] for evaluation of reported suicide attempt per triage note.  Per EMS, patient was noted to have a fever of 100.83F in route.  Reportedly she was having dysuria and recently had been diagnosed with the coronavirus.  On my evaluation, patient denies any suicidality or suicide attempt.  States that she had accidentally pulled her call bell out of the wall and was wrapping it up when staff came in and she made a comment that the cord was long enough to fit around her neck.  She adamantly denies that she was having suicidal thoughts or plans to harm herself.  1:07 PM Discussed case with Kenney Houseman, staff from [facility]. She states that CNA went to check on pt and pt made a comment that she was going to put her call bell cord around her neck. H/o SI. Afebrile at facility. Pt has had no complaints at the facility. Recovered from [place] earlier this month. Tested negative this week. States pressure ulcer has been present for several months and was recently evaluated by their in house provider. Pt is not on any abx for this.   Pt c/o pain to the right flank area and has pain to the sacral area as well. She denies urinary sxs, nvd, cough or chest pain. States that she feels a little bit short of breath when laying on her right side which she attributes to increased pain.   Past Medical History:  Diagnosis Date   CHF (congestive heart failure) (HCC)    Chronic /permanent atrial fibrillation ([place]) 2005   On Warfarin (follwed @ [facility]); Rate controlled - On BB     Chronic diastolic heart failure, NYHA class 2 (HCC)    Updated by A. fib, hypertensive heart disease and obesity; EDP was only 7 by cardiac catheterization in September 2017   Diabetic lumbosacral plexopathy (HCC)    Fibromyalgia    Generalized anxiety disorder    Hypokalemia 09/03/2017   Incontinence    Major depressive disorder    OSA on CPAP    Restless leg syndrome    Syncope 09/03/2017   Type II diabetes mellitus ([place])     Patient Active Problem List   Diagnosis Date Noted   Orthostatic hypotension    Leukocytosis 11/13/2018   Hypomagnesemia 02/02/2018   Preop cardiovascular exam 02/02/2018   Decubitus ulcer of ischium, left, stage IV ([place]) 09/28/2017   Atrial fibrillation with RVR ([place]) 09/25/2017   Cellulitis of sacral region 09/25/2017   Normocytic anemia 09/25/2017   Type 2 diabetes mellitus ([place]) 09/25/2017   Chronic pain 09/25/2017   Chronic atrial fibrillation with RVR    History of seizures 09/03/2017   Hypokalemia 09/03/2017   Depression 02/09/2017   Anxiety 02/09/2017   Venous stasis of both lower extremities 07/25/2016   Essential hypertension 07/25/2016   Anticoagulant long-term use 07/15/2016   Anatomical narrow angle borderline glaucoma of both eyes 04/02/2016   Chronic diastolic CHF (congestive heart failure) ([place]) 02/08/2016   Chronic atrial fibrillation (  [place]): CHA2DS2-VASc Score 5; on Warfarin 02/08/2016   Morbid obesity ([place]) 02/08/2016   OSA (obstructive sleep apnea) 02/08/2016   Pre-operative cardiovascular examination 02/08/2016   Medication management 02/08/2016   Fibromyalgia 08/07/2012   Restless legs syndrome 08/07/2012    Past Surgical History:  Procedure Laterality Date   CARDIAC CATHETERIZATION  11/09/2015   [facility]: Nonocclusive CAD. EF 55%. EDP 7 mmHg.   CHOLECYSTECTOMY OPEN     JOINT REPLACEMENT     KNEE ARTHROSCOPY Right    MASS EXCISION Left    sarcoma - shoulder    SHOULDER OPEN ROTATOR CUFF REPAIR Bilateral    TONSILLECTOMY     TOTAL HIP ARTHROPLASTY Right 2015   TOTAL HIP REVISION Right 2015   TOTAL KNEE ARTHROPLASTY Right    TRANSTHORACIC ECHOCARDIOGRAM  11/2013; 02/2016   a) [facility]: with [place]:  EF 60-65%. Mod LA dilation. Mild AoV sclerosis. Normal RHP. Indeterminate LV filling pressures. b) [facility]: Normal cavity size. EF 55-60%.no RWMA, mild AoV sclerosis (R cusp), Mod MAC. Mild biAtrial dilation.   TRANSTHORACIC ECHOCARDIOGRAM  08/2017    Mild LVH with normal EF 55-60%.  Unable to fully assess wall motion, but appear to be normal.  Unable to determine diastolic function because of A. fib.  Mild to moderate MAC with mild mitral stenosis (mean gradient 9 mmHg).  Moderate LA dilation.  CVP estimated 3 mmHg.  No obvious evidence of pulmonary hypertension.   VAGINAL HYSTERECTOMY       OB History   No obstetric history on file.      Home Medications    Prior to Admission medications   Medication Sig Start Date End Date Taking? Authorizing Provider  acetaminophen (TYLENOL) 500 MG tablet Take 500 mg by mouth every 6 (six) hours as needed for mild pain.    Yes [provider]  B Complex Vitamins (B COMPLEX PO) Take 1 tablet by mouth daily.   Yes [provider]  busPIRone (BUSPAR) 10 MG tablet Take 10 mg by mouth 2 (two) times daily.    Yes [provider]  cholecalciferol (VITAMIN D3) 25 MCG (1000 UT) tablet Take 1,000 Units by mouth daily.   Yes [provider]  docusate sodium (COLACE) 100 MG capsule Take 100 mg by mouth daily as needed for mild constipation.   Yes [provider]  Dulaglutide (TRULICITY) 1.5 AS/3.4HD SOPN Inject 1.5 mg into the skin every Saturday.    Yes [provider]  ezetimibe (ZETIA) 10 MG tablet Take 10 mg by mouth at bedtime.    Yes [provider]  ferrous sulfate 325 (65 FE) MG tablet Take 325 mg by mouth See admin instructions. Take one  tablet (325 mg) by mouth on Sunday, Monday, Wednesday and Friday mornings.   Yes [provider]  furosemide (LASIX) 20 MG tablet Take 1 tablet (20 mg total) by mouth daily. 11/19/18  Yes Domenic Polite, MD  gabapentin (NEURONTIN) 300 MG capsule Take 1 capsule (300 mg total) by mouth 3 (three) times daily. 11/19/18  Yes Domenic Polite, MD  guaiFENesin (MUCINEX) 600 MG 12 hr tablet Take 600 mg by mouth every 12 (twelve) hours as needed for cough or to loosen phlegm.    Yes [provider]  Insulin Glargine (BASAGLAR KWIKPEN) 100 UNIT/ML SOPN Inject 40 Units into the skin at bedtime.   Yes [provider]  insulin regular (NOVOLIN R) 100 units/mL injection Inject 0-12 Units into the skin 3 (three) times daily before  meals. 0-200 0 units 201-250 6 units 251-300 8 units 301-350 10 units 351-400 12 units Greater than 400 call MD   Yes [provider]  lidocaine (LIDODERM) 5 % Place 1 patch onto the skin daily as needed (right hip pain). Remove & Discard patch within 12 hours or as directed by MD    Yes [provider]  lisinopril (ZESTRIL) 10 MG tablet Take 10 mg by mouth daily.   Yes [provider]  magnesium oxide (MAG-OX) 400 MG tablet Take 1 tablet (400 mg total) by mouth 3 (three) times daily. ALSO TAKE A EXTRA 400 MG TABLET ON THE DAYS YOU TAKE ZAROXOLYN TABLET 02/02/18  Yes Leonie Man, MD  Melatonin 5 MG CAPS Take 5 mg by mouth at bedtime.   Yes [provider]  metoprolol succinate (TOPROL-XL) 25 MG 24 hr tablet Take 1.5 tablets (37.5 mg total) by mouth daily. 11/20/18  Yes Domenic Polite, MD  Multiple Vitamin (MULTIVITAMIN WITH MINERALS) TABS tablet Take 1 tablet by mouth daily.   Yes [provider]  Nutritional Supplements (PROMOD) LIQD Take 30 mLs by mouth 2 (two) times daily.   Yes [provider]  omeprazole (PRILOSEC) 20 MG capsule Take 20 mg by mouth daily.   Yes [provider]  ondansetron  (ZOFRAN) 4 MG tablet Take 1 tablet (4 mg total) by mouth every 6 (six) hours as needed for nausea. 09/30/17  Yes Georgette Shell, MD  OxyCODONE HCl, Abuse Deter, (OXAYDO) 5 MG TABA Take 1 tablet by mouth 3 (three) times daily as needed (pain). Patient taking differently: Take 5 mg by mouth every 8 (eight) hours as needed (pain).  11/19/18  Yes Domenic Polite, MD  potassium chloride SA (K-DUR) 20 MEQ tablet Take 2 tablets (40 mEq total) by mouth daily. TAKE AN EXTRA 20 MEQ ON THE DAYS YOU TAKE ZAROXLYN Patient taking differently: Take 20 mEq by mouth daily.  11/19/18  Yes Domenic Polite, MD  pramipexole (MIRAPEX) 0.125 MG tablet Take 0.125 mg by mouth at bedtime.    Yes [provider]  Quercetin 250 MG TABS Take 250 mg by mouth 2 (two) times daily.   Yes [provider]  sertraline (ZOLOFT) 100 MG tablet Take 100 mg by mouth daily.   Yes [provider]  spironolactone (ALDACTONE) 25 MG tablet Take 0.5 tablets (12.5 mg total) by mouth daily. 11/20/18  Yes Domenic Polite, MD  tiZANidine (ZANAFLEX) 4 MG tablet Take 4 mg by mouth 3 (three) times daily.    Yes [provider]  vitamin C (ASCORBIC ACID) 500 MG tablet Take 500 mg by mouth 2 (two) times daily.    Yes [provider]  warfarin (COUMADIN) 6 MG tablet Take 6 mg by mouth See admin instructions. Take 62m daily on Monday, Tuesday, Thursday, Friday, Saturday   Yes [provider]  warfarin (COUMADIN) 7.5 MG tablet Take 7.5 mg by mouth See admin instructions. Take 7.5 mg on Sunday and Wednesday   Yes [provider]  zinc sulfate 220 (50 Zn) MG capsule Take 220 mg by mouth daily.   Yes [provider]  ALPRAZolam (XANAX) 0.25 MG tablet Take 1 tablet (0.25 mg total) by mouth 3 (three) times daily. Patient taking differently: Take 0.25 mg by mouth 2 (two) times daily.  11/19/18   JDomenic Polite MD  amoxicillin (AMOXIL) 500 MG capsule Take 1 capsule (500 mg total) by mouth 3  (three) times daily for 7 days. 01/28/19 02/04/19  Rodderick Holtzer S, PA-C  ARIPiprazole (ABILIFY) 10 MG tablet Take 10 mg by mouth daily.    [provider]  ARIPiprazole (ABILIFY) 5 MG tablet Take 5 mg by mouth daily.    [provider]  insulin detemir (LEVEMIR) 100 UNIT/ML injection Inject 0.4 mLs (40 Units total) into the skin at bedtime. Patient not taking: Reported on 01/28/2019 09/30/17   Georgette Shell, MD    Family History Family History  Problem Relation Age of Onset   Hypertension Mother    Heart disease Mother    Leukemia Son     Social History Social History   Tobacco Use   Smoking status: Never Smoker   Smokeless tobacco: Never Used  Substance Use Topics   Alcohol use: No   Drug use: No     Allergies   Bee venom, Other, Gluten meal, Alteplase, Cefepime, Fentanyl, and Metformin and related   Review of Systems Review of Systems  Constitutional: Negative for fever.  HENT: Negative for ear pain and sore throat.   Eyes: Negative for visual disturbance.  Respiratory: Negative for cough and shortness of breath.   Cardiovascular: Negative for chest pain.  Gastrointestinal: Negative for abdominal pain, constipation, diarrhea, nausea and vomiting.  Genitourinary: Positive for flank pain. Negative for dysuria and hematuria.  Musculoskeletal: Negative for back pain.  Skin: Positive for wound.  Neurological: Negative for headaches.  All other systems reviewed and are negative.    Physical Exam Updated Vital Signs BP (!) 152/93    Pulse (!) 111    Temp 100.3 F (37.9 C) (Rectal)    Resp (!) 21    SpO2 94%   Physical Exam Vitals signs and nursing note reviewed.  Constitutional:      General: She is not in acute distress.    Appearance: She is well-developed.  HENT:     Head: Normocephalic and atraumatic.     Mouth/Throat:     Mouth: Mucous membranes are dry.  Eyes:     Conjunctiva/sclera: Conjunctivae normal.  Neck:      Musculoskeletal: Neck supple.  Cardiovascular:     Rate and Rhythm: Regular rhythm. Tachycardia present.     Heart sounds: Normal heart sounds. No murmur.  Pulmonary:     Effort: Pulmonary effort is normal. No respiratory distress.     Breath sounds: Normal breath sounds. No wheezing, rhonchi or rales.  Abdominal:     General: Bowel sounds are normal.     Palpations: Abdomen is soft.     Tenderness: There is abdominal tenderness (mild right abd ttp). There is right CVA tenderness. There is no left CVA tenderness, guarding or rebound.  Skin:    General: Skin is warm and dry.     Comments: Large sacral ulcer present without surrounding erythema, crepitus or warmth. No obvious drainage of pus. (pictured below)  Neurological:     Mental Status: She is alert.      ED Treatments / Results  Labs (all labs ordered are listed, but only abnormal results are displayed) Labs Reviewed  CBC WITH DIFFERENTIAL/PLATELET - Abnormal; Notable for the following components:      Result Value   Hemoglobin 10.6 (*)    MCV 78.9 (*)    MCH 23.0 (*)    MCHC 29.2 (*)    RDW 17.9 (*)    All other components within normal limits  COMPREHENSIVE METABOLIC PANEL - Abnormal; Notable for the following components:   Glucose, Bld 204 (*)  Calcium 8.8 (*)    Albumin 3.4 (*)    All other components within normal limits  URINALYSIS, ROUTINE W REFLEX MICROSCOPIC - Abnormal; Notable for the following components:   APPearance HAZY (*)    Protein, ur 30 (*)    Nitrite POSITIVE (*)    Leukocytes,Ua LARGE (*)    WBC, UA >50 (*)    Bacteria, UA RARE (*)    All other components within normal limits  PROTIME-INR - Abnormal; Notable for the following components:   Prothrombin Time 23.2 (*)    INR 2.1 (*)    All other components within normal limits  CULTURE, BLOOD (ROUTINE X 2)  CULTURE, BLOOD (ROUTINE X 2)  URINE CULTURE  LIPASE, BLOOD  LACTIC ACID, PLASMA  LACTIC ACID, PLASMA  TROPONIN I (HIGH SENSITIVITY)    TROPONIN I (HIGH SENSITIVITY)    EKG EKG Interpretation  Date/Time:  Friday January 28 2019 16:34:24 EST Ventricular Rate:  113 PR Interval:    QRS Duration: 92 QT Interval:  352 QTC Calculation: 483 R Axis:   55 Text Interpretation: Atrial fibrillation Low voltage, extremity leads Confirmed by Pattricia Boss [phone]) on 01/28/2019 6:12:48 PM   Radiology Dg Chest Portable 1 View  Result Date: 01/28/2019 CLINICAL DATA:  Chest pain and shortness of breath EXAM: PORTABLE CHEST 1 VIEW COMPARISON:  Chest pain and shortness of breath FINDINGS: Lungs are clear. Heart is mildly enlarged with pulmonary vascularity normal. No adenopathy. There is aortic atherosclerosis. IMPRESSION: No edema or consolidation. Mild cardiomegaly. Aortic Atherosclerosis (ICD10-I70.0). Electronically Signed   By: Lowella Grip III M.D.   On: 01/28/2019 13:10   Ct Renal Stone Study  Result Date: 01/28/2019 CLINICAL DATA:  Pain and fever. EXAM: CT ABDOMEN AND PELVIS WITHOUT CONTRAST TECHNIQUE: Multidetector CT imaging of the abdomen and pelvis was performed following the standard protocol without IV contrast. COMPARISON:  September 28, 2017.  September 03, 2017. FINDINGS: Lower chest: There is a stable small pulmonary nodule in the left lung base.The heart is enlarged. Hepatobiliary: The liver is normal. Status post cholecystectomy.There is no biliary ductal dilation. Pancreas: Normal contours without ductal dilatation. No peripancreatic fluid collection. Spleen: No splenic laceration or hematoma. Adrenals/Urinary Tract: --Adrenal glands: No adrenal hemorrhage. --Right kidney/ureter: There is no right-sided hydronephrosis. There is a nonobstructing stone in the interpolar region. --Left kidney/ureter: No hydronephrosis or perinephric hematoma. --Urinary bladder: Unremarkable. Stomach/Bowel: --Stomach/Duodenum: No hiatal hernia or other gastric abnormality. Normal duodenal course and caliber. --Small bowel: No dilatation or  inflammation. --Colon: No focal abnormality. --Appendix: Not visualized. No right lower quadrant inflammation or free fluid. Vascular/Lymphatic: Atherosclerotic calcification is present within the non-aneurysmal abdominal aorta, without hemodynamically significant stenosis. --No retroperitoneal lymphadenopathy. --No mesenteric lymphadenopathy. --No pelvic or inguinal lymphadenopathy. Reproductive: Unremarkable Other: No ascites or free air. The abdominal wall is normal. Musculoskeletal. No acute displaced fractures. IMPRESSION: No acute abnormality.  Chronic changes as above. Electronically Signed   By: Constance Holster M.D.   On: 01/28/2019 17:31    Procedures Procedures (including critical care time)  Medications Ordered in ED Medications  LORazepam (ATIVAN) injection 1 mg (has no administration in time range)  acetaminophen (TYLENOL) tablet 650 mg (650 mg Oral Given 01/28/19 1557)  aztreonam (AZACTAM) 1 g in sodium chloride 0.9 % 100 mL IVPB (0 g Intravenous Stopped 01/28/19 1622)  sodium chloride 0.9 % bolus 500 mL (500 mLs Intravenous Bolus 01/28/19 1552)  LORazepam (ATIVAN) injection 1 mg (1 mg Intravenous Given 01/28/19 1639)  sodium chloride 0.9 %  bolus 1,000 mL (0 mLs Intravenous Stopped 01/28/19 1940)     Initial Impression / Assessment and Plan / ED Course  I have reviewed the triage vital signs and the nursing notes.  Pertinent labs & imaging results that were available during my care of the patient were reviewed by me and considered in my medical decision making (see chart for details).   Final Clinical Impressions(s) / ED Diagnoses   Final diagnoses:  Acute cystitis with hematuria   68 y/o female presenting initially for si, found to be febrile en route and had low grade fever on arrival. Initially somewhat tachycardic. No hypotension. satting well on ra.   Pt denied SI on my assessment multiple times. Denied SI to nursing staff and denied SI to supervising physician,  Dr. Jeanell Sparrow. I do not feel that emergent psychiatric evaluation in necessary at this time.   CBC was without leukocytosis.  Anemia present but stable. CMP nonacute Lipase negative Troponin negative x2. UA with nitrite positive UTI.  Urine culture was sent. Lactic acid was negative  CT renal scan without acute abnormality.  Chest x-ray negative for pneumonia or other acute abnormality.  Work-up consistent with urinary tract infection, with right-sided flank pain and fever there is suspicion for pyelonephritis.  Patient is nontoxic-appearing nonseptic appearing she has been able to tolerate p.o. in the ED. She was given a dose of antibiotics to cover pyelonephritis.  HR has improved with fluids.  Discussed case with Tanda Rockers with pharmacy. Reviewed prior culture results and allergies. She recommends amox 500 tid x7 days to cover suspected pyelonephritis.  Discussed results and plan for discharge with antibiotics.  Advised on follow-up plan and return precautions.  All questions answered.  Patient stable for discharge.  ED Discharge Orders         Ordered    amoxicillin (AMOXIL) 500 MG capsule  3 times daily     01/28/19 [address], PA-C 01/28/19 [address] [place] 01/28/19 2010    Pattricia Boss, MD 01/29/19 [phone]

## 2019-01-28 NOTE — ED Notes (Signed)
Pure wick has been placed. Suction set to 45mmHg. Pt has been educated on pure wick.  

## 2019-01-28 NOTE — ED Notes (Signed)
Attempted to contact [facility] to give report to nurse at facility, placed on hold twice and no one answered.

## 2019-01-29 DIAGNOSIS — E1140 Type 2 diabetes mellitus with diabetic neuropathy, unspecified: Secondary | ICD-10-CM | POA: Diagnosis not present

## 2019-01-29 DIAGNOSIS — Z7409 Other reduced mobility: Secondary | ICD-10-CM | POA: Diagnosis not present

## 2019-01-29 DIAGNOSIS — M6281 Muscle weakness (generalized): Secondary | ICD-10-CM | POA: Diagnosis not present

## 2019-01-29 DIAGNOSIS — L98411 Non-pressure chronic ulcer of buttock limited to breakdown of skin: Secondary | ICD-10-CM | POA: Diagnosis not present

## 2019-01-31 LAB — URINE CULTURE: Culture: >=100000 — AB

## 2019-02-01 ENCOUNTER — Telehealth: Payer: Self-pay

## 2019-02-01 NOTE — Telephone Encounter (Signed)
[facility] ED 01/28/2019 report faxed to [facility], [phone], with instructions for F/U care and change in abx

## 2019-02-01 NOTE — Progress Notes (Signed)
ED Antimicrobial Stewardship Positive Culture Follow Up   Donna [name] is an 68 y.o. female who presented to [facility] on 01/28/2019 with a chief complaint of fever, dysuria, right flank pain, and suicidal.  Chief Complaint  Patient presents with  . Fever  . Suicide Attempt    Recent Results (from the past 720 hour(s))  Blood culture (routine x 2)     Status: None (Preliminary result)   Collection Time: 01/28/19 11:25 AM   Specimen: BLOOD  Result Value Ref Range Status   Specimen Description   Final    BLOOD RIGHT ANTECUBITAL Performed at [facility] [address] [place], [place] [address]    Special Requests   Final    BOTTLES DRAWN AEROBIC AND ANAEROBIC Blood Culture adequate volume Performed at [facility] [address], [place], [place] [address]    Culture   Final    NO GROWTH 4 DAYS Performed at [facility], [place] [address] [place], [place] [address]    Report Status PENDING  Incomplete  Urine culture     Status: Abnormal   Collection Time: 01/28/19 12:07 PM   Specimen: Urine, Random  Result Value Ref Range Status   Specimen Description   Final    URINE, RANDOM Performed at [facility] [address], [place], [place] [address]    Special Requests   Final    NONE Performed at [facility], [place] [address] [place], [place] [address]    Culture (A)  Final    >=100,000 COLONIES/mL PROVIDENCIA STUARTII >=100,000 COLONIES/mL GROUP B STREP(S.AGALACTIAE)ISOLATED TESTING AGAINST S. AGALACTIAE NOT ROUTINELY PERFORMED DUE TO PREDICTABILITY OF AMP/PEN/VAN SUSCEPTIBILITY. Performed at [facility], [place] [address] [place], [place] [address]    Report Status 01/31/2019 FINAL  Final   Organism ID, Bacteria PROVIDENCIA STUARTII (A)  Final      Susceptibility   Providencia stuartii - MIC*    AMPICILLIN RESISTANT Resistant     CEFAZOLIN >=64 RESISTANT Resistant     CEFTRIAXONE  <=1 SENSITIVE Sensitive     CIPROFLOXACIN >=4 RESISTANT Resistant     GENTAMICIN RESISTANT Resistant     IMIPENEM 2 SENSITIVE Sensitive     NITROFURANTOIN 256 RESISTANT Resistant     TRIMETH/SULFA <=20 SENSITIVE Sensitive     AMPICILLIN/SULBACTAM 4 SENSITIVE Sensitive     PIP/TAZO <=4 SENSITIVE Sensitive     * >=100,000 COLONIES/mL PROVIDENCIA STUARTII  Blood culture (routine x 2)     Status: None (Preliminary result)   Collection Time: 01/28/19  3:36 PM   Specimen: BLOOD  Result Value Ref Range Status   Specimen Description   Final    BLOOD LEFT ANTECUBITAL Performed at [facility] [address], [place], [place] [address]    Special Requests   Final    BOTTLES DRAWN AEROBIC AND ANAEROBIC Blood Culture results may not be optimal due to an inadequate volume of blood received in culture bottles Performed at [facility] [address], [place], [place] [address]    Culture   Final    NO GROWTH 4 DAYS Performed at [facility], [place] [address] [place] [address], [place], [place] [address]    Report Status PENDING  Incomplete    [x]  Treated with amoxicillin , organism resistant to prescribed antimicrobial []  Patient discharged originally without antimicrobial agent and treatment is now indicated  Plan:  1) d/c amoxicillin 2) start bactrim DS  1 tablet twice daily for 10 days 3)  Recheck BMP now and every 5 days while on bactrim to monitor potassium  ED Provider: Suella Broad, PA-C   Dia Sitter P 02/01/2019, 9:03 AM Clinical Pharmacist [phone]

## 2019-02-02 DIAGNOSIS — Z7409 Other reduced mobility: Secondary | ICD-10-CM | POA: Diagnosis not present

## 2019-02-02 DIAGNOSIS — M6281 Muscle weakness (generalized): Secondary | ICD-10-CM | POA: Diagnosis not present

## 2019-02-02 DIAGNOSIS — L98411 Non-pressure chronic ulcer of buttock limited to breakdown of skin: Secondary | ICD-10-CM | POA: Diagnosis not present

## 2019-02-02 DIAGNOSIS — E1140 Type 2 diabetes mellitus with diabetic neuropathy, unspecified: Secondary | ICD-10-CM | POA: Diagnosis not present

## 2019-02-02 LAB — CULTURE, BLOOD (ROUTINE X 2)
Culture: NO GROWTH
Culture: NO GROWTH
Special Requests: ADEQUATE

## 2019-02-09 DIAGNOSIS — E1140 Type 2 diabetes mellitus with diabetic neuropathy, unspecified: Secondary | ICD-10-CM | POA: Diagnosis not present

## 2019-02-09 DIAGNOSIS — L98411 Non-pressure chronic ulcer of buttock limited to breakdown of skin: Secondary | ICD-10-CM | POA: Diagnosis not present

## 2019-02-09 DIAGNOSIS — Z7409 Other reduced mobility: Secondary | ICD-10-CM | POA: Diagnosis not present

## 2019-02-09 DIAGNOSIS — M6281 Muscle weakness (generalized): Secondary | ICD-10-CM | POA: Diagnosis not present

## 2019-02-24 DIAGNOSIS — E876 Hypokalemia: Secondary | ICD-10-CM | POA: Diagnosis not present

## 2019-02-24 DIAGNOSIS — E1151 Type 2 diabetes mellitus with diabetic peripheral angiopathy without gangrene: Secondary | ICD-10-CM | POA: Diagnosis not present

## 2019-02-24 DIAGNOSIS — G4733 Obstructive sleep apnea (adult) (pediatric): Secondary | ICD-10-CM | POA: Diagnosis not present

## 2019-02-24 DIAGNOSIS — R32 Unspecified urinary incontinence: Secondary | ICD-10-CM | POA: Diagnosis not present

## 2019-02-24 DIAGNOSIS — L89154 Pressure ulcer of sacral region, stage 4: Secondary | ICD-10-CM | POA: Diagnosis not present

## 2019-02-24 DIAGNOSIS — Z7901 Long term (current) use of anticoagulants: Secondary | ICD-10-CM | POA: Diagnosis not present

## 2019-02-24 DIAGNOSIS — G40909 Epilepsy, unspecified, not intractable, without status epilepticus: Secondary | ICD-10-CM | POA: Diagnosis not present

## 2019-02-24 DIAGNOSIS — G8929 Other chronic pain: Secondary | ICD-10-CM | POA: Diagnosis not present

## 2019-02-24 DIAGNOSIS — G541 Lumbosacral plexus disorders: Secondary | ICD-10-CM | POA: Diagnosis not present

## 2019-02-24 DIAGNOSIS — I872 Venous insufficiency (chronic) (peripheral): Secondary | ICD-10-CM | POA: Diagnosis not present

## 2019-02-24 DIAGNOSIS — Z87440 Personal history of urinary (tract) infections: Secondary | ICD-10-CM | POA: Diagnosis not present

## 2019-02-24 DIAGNOSIS — I4821 Permanent atrial fibrillation: Secondary | ICD-10-CM | POA: Diagnosis not present

## 2019-02-24 DIAGNOSIS — E1144 Type 2 diabetes mellitus with diabetic amyotrophy: Secondary | ICD-10-CM | POA: Diagnosis not present

## 2019-02-24 DIAGNOSIS — I5032 Chronic diastolic (congestive) heart failure: Secondary | ICD-10-CM | POA: Diagnosis not present

## 2019-02-24 DIAGNOSIS — I110 Hypertensive heart disease with heart failure: Secondary | ICD-10-CM | POA: Diagnosis not present

## 2019-02-24 DIAGNOSIS — Z794 Long term (current) use of insulin: Secondary | ICD-10-CM | POA: Diagnosis not present

## 2019-02-24 DIAGNOSIS — M797 Fibromyalgia: Secondary | ICD-10-CM | POA: Diagnosis not present

## 2019-02-24 DIAGNOSIS — F329 Major depressive disorder, single episode, unspecified: Secondary | ICD-10-CM | POA: Diagnosis not present

## 2019-02-24 DIAGNOSIS — F411 Generalized anxiety disorder: Secondary | ICD-10-CM | POA: Diagnosis not present

## 2019-02-24 DIAGNOSIS — G2581 Restless legs syndrome: Secondary | ICD-10-CM | POA: Diagnosis not present

## 2019-02-24 DIAGNOSIS — M545 Low back pain: Secondary | ICD-10-CM | POA: Diagnosis not present

## 2019-02-24 DIAGNOSIS — D649 Anemia, unspecified: Secondary | ICD-10-CM | POA: Diagnosis not present

## 2019-02-24 DIAGNOSIS — E1140 Type 2 diabetes mellitus with diabetic neuropathy, unspecified: Secondary | ICD-10-CM | POA: Diagnosis not present

## 2019-02-24 DIAGNOSIS — Z6835 Body mass index (BMI) 35.0-35.9, adult: Secondary | ICD-10-CM | POA: Diagnosis not present

## 2019-02-28 DIAGNOSIS — I5032 Chronic diastolic (congestive) heart failure: Secondary | ICD-10-CM | POA: Diagnosis not present

## 2019-02-28 DIAGNOSIS — E1144 Type 2 diabetes mellitus with diabetic amyotrophy: Secondary | ICD-10-CM | POA: Diagnosis not present

## 2019-02-28 DIAGNOSIS — L89154 Pressure ulcer of sacral region, stage 4: Secondary | ICD-10-CM | POA: Diagnosis not present

## 2019-02-28 DIAGNOSIS — E876 Hypokalemia: Secondary | ICD-10-CM | POA: Diagnosis not present

## 2019-02-28 DIAGNOSIS — I4821 Permanent atrial fibrillation: Secondary | ICD-10-CM | POA: Diagnosis not present

## 2019-02-28 DIAGNOSIS — I110 Hypertensive heart disease with heart failure: Secondary | ICD-10-CM | POA: Diagnosis not present

## 2019-03-03 DIAGNOSIS — I5032 Chronic diastolic (congestive) heart failure: Secondary | ICD-10-CM | POA: Diagnosis not present

## 2019-03-03 DIAGNOSIS — E876 Hypokalemia: Secondary | ICD-10-CM | POA: Diagnosis not present

## 2019-03-03 DIAGNOSIS — I110 Hypertensive heart disease with heart failure: Secondary | ICD-10-CM | POA: Diagnosis not present

## 2019-03-03 DIAGNOSIS — I4821 Permanent atrial fibrillation: Secondary | ICD-10-CM | POA: Diagnosis not present

## 2019-03-03 DIAGNOSIS — L89154 Pressure ulcer of sacral region, stage 4: Secondary | ICD-10-CM | POA: Diagnosis not present

## 2019-03-03 DIAGNOSIS — E1144 Type 2 diabetes mellitus with diabetic amyotrophy: Secondary | ICD-10-CM | POA: Diagnosis not present

## 2019-03-08 DIAGNOSIS — I5032 Chronic diastolic (congestive) heart failure: Secondary | ICD-10-CM | POA: Diagnosis not present

## 2019-03-08 DIAGNOSIS — I4821 Permanent atrial fibrillation: Secondary | ICD-10-CM | POA: Diagnosis not present

## 2019-03-08 DIAGNOSIS — E1144 Type 2 diabetes mellitus with diabetic amyotrophy: Secondary | ICD-10-CM | POA: Diagnosis not present

## 2019-03-08 DIAGNOSIS — E876 Hypokalemia: Secondary | ICD-10-CM | POA: Diagnosis not present

## 2019-03-08 DIAGNOSIS — L89154 Pressure ulcer of sacral region, stage 4: Secondary | ICD-10-CM | POA: Diagnosis not present

## 2019-03-08 DIAGNOSIS — I110 Hypertensive heart disease with heart failure: Secondary | ICD-10-CM | POA: Diagnosis not present

## 2019-03-09 DIAGNOSIS — L89154 Pressure ulcer of sacral region, stage 4: Secondary | ICD-10-CM | POA: Diagnosis not present

## 2019-03-09 DIAGNOSIS — E876 Hypokalemia: Secondary | ICD-10-CM | POA: Diagnosis not present

## 2019-03-09 DIAGNOSIS — E1144 Type 2 diabetes mellitus with diabetic amyotrophy: Secondary | ICD-10-CM | POA: Diagnosis not present

## 2019-03-09 DIAGNOSIS — I5032 Chronic diastolic (congestive) heart failure: Secondary | ICD-10-CM | POA: Diagnosis not present

## 2019-03-09 DIAGNOSIS — I110 Hypertensive heart disease with heart failure: Secondary | ICD-10-CM | POA: Diagnosis not present

## 2019-03-09 DIAGNOSIS — I4821 Permanent atrial fibrillation: Secondary | ICD-10-CM | POA: Diagnosis not present

## 2019-03-11 DIAGNOSIS — I4821 Permanent atrial fibrillation: Secondary | ICD-10-CM | POA: Diagnosis not present

## 2019-03-11 DIAGNOSIS — I110 Hypertensive heart disease with heart failure: Secondary | ICD-10-CM | POA: Diagnosis not present

## 2019-03-11 DIAGNOSIS — E1144 Type 2 diabetes mellitus with diabetic amyotrophy: Secondary | ICD-10-CM | POA: Diagnosis not present

## 2019-03-11 DIAGNOSIS — E876 Hypokalemia: Secondary | ICD-10-CM | POA: Diagnosis not present

## 2019-03-11 DIAGNOSIS — L89154 Pressure ulcer of sacral region, stage 4: Secondary | ICD-10-CM | POA: Diagnosis not present

## 2019-03-11 DIAGNOSIS — I5032 Chronic diastolic (congestive) heart failure: Secondary | ICD-10-CM | POA: Diagnosis not present

## 2019-03-14 DIAGNOSIS — E876 Hypokalemia: Secondary | ICD-10-CM | POA: Diagnosis not present

## 2019-03-14 DIAGNOSIS — I5032 Chronic diastolic (congestive) heart failure: Secondary | ICD-10-CM | POA: Diagnosis not present

## 2019-03-14 DIAGNOSIS — L89154 Pressure ulcer of sacral region, stage 4: Secondary | ICD-10-CM | POA: Diagnosis not present

## 2019-03-14 DIAGNOSIS — I4821 Permanent atrial fibrillation: Secondary | ICD-10-CM | POA: Diagnosis not present

## 2019-03-14 DIAGNOSIS — E1144 Type 2 diabetes mellitus with diabetic amyotrophy: Secondary | ICD-10-CM | POA: Diagnosis not present

## 2019-03-14 DIAGNOSIS — I110 Hypertensive heart disease with heart failure: Secondary | ICD-10-CM | POA: Diagnosis not present

## 2019-03-15 DIAGNOSIS — E876 Hypokalemia: Secondary | ICD-10-CM | POA: Diagnosis not present

## 2019-03-15 DIAGNOSIS — I4821 Permanent atrial fibrillation: Secondary | ICD-10-CM | POA: Diagnosis not present

## 2019-03-15 DIAGNOSIS — I5032 Chronic diastolic (congestive) heart failure: Secondary | ICD-10-CM | POA: Diagnosis not present

## 2019-03-15 DIAGNOSIS — E1144 Type 2 diabetes mellitus with diabetic amyotrophy: Secondary | ICD-10-CM | POA: Diagnosis not present

## 2019-03-15 DIAGNOSIS — I110 Hypertensive heart disease with heart failure: Secondary | ICD-10-CM | POA: Diagnosis not present

## 2019-03-15 DIAGNOSIS — L89154 Pressure ulcer of sacral region, stage 4: Secondary | ICD-10-CM | POA: Diagnosis not present

## 2019-03-16 DIAGNOSIS — E876 Hypokalemia: Secondary | ICD-10-CM | POA: Diagnosis not present

## 2019-03-16 DIAGNOSIS — I110 Hypertensive heart disease with heart failure: Secondary | ICD-10-CM | POA: Diagnosis not present

## 2019-03-16 DIAGNOSIS — I4821 Permanent atrial fibrillation: Secondary | ICD-10-CM | POA: Diagnosis not present

## 2019-03-16 DIAGNOSIS — I5032 Chronic diastolic (congestive) heart failure: Secondary | ICD-10-CM | POA: Diagnosis not present

## 2019-03-16 DIAGNOSIS — E1144 Type 2 diabetes mellitus with diabetic amyotrophy: Secondary | ICD-10-CM | POA: Diagnosis not present

## 2019-03-16 DIAGNOSIS — L89154 Pressure ulcer of sacral region, stage 4: Secondary | ICD-10-CM | POA: Diagnosis not present

## 2019-03-18 DIAGNOSIS — E876 Hypokalemia: Secondary | ICD-10-CM | POA: Diagnosis not present

## 2019-03-18 DIAGNOSIS — I110 Hypertensive heart disease with heart failure: Secondary | ICD-10-CM | POA: Diagnosis not present

## 2019-03-18 DIAGNOSIS — I5032 Chronic diastolic (congestive) heart failure: Secondary | ICD-10-CM | POA: Diagnosis not present

## 2019-03-18 DIAGNOSIS — L89154 Pressure ulcer of sacral region, stage 4: Secondary | ICD-10-CM | POA: Diagnosis not present

## 2019-03-18 DIAGNOSIS — E1144 Type 2 diabetes mellitus with diabetic amyotrophy: Secondary | ICD-10-CM | POA: Diagnosis not present

## 2019-03-18 DIAGNOSIS — I4821 Permanent atrial fibrillation: Secondary | ICD-10-CM | POA: Diagnosis not present

## 2019-03-21 DIAGNOSIS — E876 Hypokalemia: Secondary | ICD-10-CM | POA: Diagnosis not present

## 2019-03-21 DIAGNOSIS — I110 Hypertensive heart disease with heart failure: Secondary | ICD-10-CM | POA: Diagnosis not present

## 2019-03-21 DIAGNOSIS — I4821 Permanent atrial fibrillation: Secondary | ICD-10-CM | POA: Diagnosis not present

## 2019-03-21 DIAGNOSIS — E1144 Type 2 diabetes mellitus with diabetic amyotrophy: Secondary | ICD-10-CM | POA: Diagnosis not present

## 2019-03-21 DIAGNOSIS — I5032 Chronic diastolic (congestive) heart failure: Secondary | ICD-10-CM | POA: Diagnosis not present

## 2019-03-21 DIAGNOSIS — L89154 Pressure ulcer of sacral region, stage 4: Secondary | ICD-10-CM | POA: Diagnosis not present

## 2019-03-22 DIAGNOSIS — E1144 Type 2 diabetes mellitus with diabetic amyotrophy: Secondary | ICD-10-CM | POA: Diagnosis not present

## 2019-03-22 DIAGNOSIS — I5032 Chronic diastolic (congestive) heart failure: Secondary | ICD-10-CM | POA: Diagnosis not present

## 2019-03-22 DIAGNOSIS — E876 Hypokalemia: Secondary | ICD-10-CM | POA: Diagnosis not present

## 2019-03-22 DIAGNOSIS — I110 Hypertensive heart disease with heart failure: Secondary | ICD-10-CM | POA: Diagnosis not present

## 2019-03-22 DIAGNOSIS — I4821 Permanent atrial fibrillation: Secondary | ICD-10-CM | POA: Diagnosis not present

## 2019-03-22 DIAGNOSIS — L89154 Pressure ulcer of sacral region, stage 4: Secondary | ICD-10-CM | POA: Diagnosis not present

## 2019-03-23 DIAGNOSIS — I110 Hypertensive heart disease with heart failure: Secondary | ICD-10-CM | POA: Diagnosis not present

## 2019-03-23 DIAGNOSIS — L89154 Pressure ulcer of sacral region, stage 4: Secondary | ICD-10-CM | POA: Diagnosis not present

## 2019-03-23 DIAGNOSIS — E1144 Type 2 diabetes mellitus with diabetic amyotrophy: Secondary | ICD-10-CM | POA: Diagnosis not present

## 2019-03-23 DIAGNOSIS — I4821 Permanent atrial fibrillation: Secondary | ICD-10-CM | POA: Diagnosis not present

## 2019-03-23 DIAGNOSIS — I5032 Chronic diastolic (congestive) heart failure: Secondary | ICD-10-CM | POA: Diagnosis not present

## 2019-03-23 DIAGNOSIS — E876 Hypokalemia: Secondary | ICD-10-CM | POA: Diagnosis not present

## 2019-03-24 DIAGNOSIS — I4821 Permanent atrial fibrillation: Secondary | ICD-10-CM | POA: Diagnosis not present

## 2019-03-24 DIAGNOSIS — E876 Hypokalemia: Secondary | ICD-10-CM | POA: Diagnosis not present

## 2019-03-24 DIAGNOSIS — E1144 Type 2 diabetes mellitus with diabetic amyotrophy: Secondary | ICD-10-CM | POA: Diagnosis not present

## 2019-03-24 DIAGNOSIS — I110 Hypertensive heart disease with heart failure: Secondary | ICD-10-CM | POA: Diagnosis not present

## 2019-03-24 DIAGNOSIS — L89154 Pressure ulcer of sacral region, stage 4: Secondary | ICD-10-CM | POA: Diagnosis not present

## 2019-03-24 DIAGNOSIS — I5032 Chronic diastolic (congestive) heart failure: Secondary | ICD-10-CM | POA: Diagnosis not present

## 2019-03-26 DIAGNOSIS — M797 Fibromyalgia: Secondary | ICD-10-CM | POA: Diagnosis not present

## 2019-03-26 DIAGNOSIS — G40909 Epilepsy, unspecified, not intractable, without status epilepticus: Secondary | ICD-10-CM | POA: Diagnosis not present

## 2019-03-26 DIAGNOSIS — G541 Lumbosacral plexus disorders: Secondary | ICD-10-CM | POA: Diagnosis not present

## 2019-03-26 DIAGNOSIS — F411 Generalized anxiety disorder: Secondary | ICD-10-CM | POA: Diagnosis not present

## 2019-03-26 DIAGNOSIS — E1151 Type 2 diabetes mellitus with diabetic peripheral angiopathy without gangrene: Secondary | ICD-10-CM | POA: Diagnosis not present

## 2019-03-26 DIAGNOSIS — I872 Venous insufficiency (chronic) (peripheral): Secondary | ICD-10-CM | POA: Diagnosis not present

## 2019-03-26 DIAGNOSIS — G8929 Other chronic pain: Secondary | ICD-10-CM | POA: Diagnosis not present

## 2019-03-26 DIAGNOSIS — E1140 Type 2 diabetes mellitus with diabetic neuropathy, unspecified: Secondary | ICD-10-CM | POA: Diagnosis not present

## 2019-03-26 DIAGNOSIS — Z6835 Body mass index (BMI) 35.0-35.9, adult: Secondary | ICD-10-CM | POA: Diagnosis not present

## 2019-03-26 DIAGNOSIS — E876 Hypokalemia: Secondary | ICD-10-CM | POA: Diagnosis not present

## 2019-03-26 DIAGNOSIS — I110 Hypertensive heart disease with heart failure: Secondary | ICD-10-CM | POA: Diagnosis not present

## 2019-03-26 DIAGNOSIS — Z7901 Long term (current) use of anticoagulants: Secondary | ICD-10-CM | POA: Diagnosis not present

## 2019-03-26 DIAGNOSIS — F329 Major depressive disorder, single episode, unspecified: Secondary | ICD-10-CM | POA: Diagnosis not present

## 2019-03-26 DIAGNOSIS — G4733 Obstructive sleep apnea (adult) (pediatric): Secondary | ICD-10-CM | POA: Diagnosis not present

## 2019-03-26 DIAGNOSIS — M545 Low back pain: Secondary | ICD-10-CM | POA: Diagnosis not present

## 2019-03-26 DIAGNOSIS — E1144 Type 2 diabetes mellitus with diabetic amyotrophy: Secondary | ICD-10-CM | POA: Diagnosis not present

## 2019-03-26 DIAGNOSIS — R32 Unspecified urinary incontinence: Secondary | ICD-10-CM | POA: Diagnosis not present

## 2019-03-26 DIAGNOSIS — Z87440 Personal history of urinary (tract) infections: Secondary | ICD-10-CM | POA: Diagnosis not present

## 2019-03-26 DIAGNOSIS — L89154 Pressure ulcer of sacral region, stage 4: Secondary | ICD-10-CM | POA: Diagnosis not present

## 2019-03-26 DIAGNOSIS — I4821 Permanent atrial fibrillation: Secondary | ICD-10-CM | POA: Diagnosis not present

## 2019-03-26 DIAGNOSIS — D649 Anemia, unspecified: Secondary | ICD-10-CM | POA: Diagnosis not present

## 2019-03-26 DIAGNOSIS — Z794 Long term (current) use of insulin: Secondary | ICD-10-CM | POA: Diagnosis not present

## 2019-03-26 DIAGNOSIS — I5032 Chronic diastolic (congestive) heart failure: Secondary | ICD-10-CM | POA: Diagnosis not present

## 2019-03-26 DIAGNOSIS — G2581 Restless legs syndrome: Secondary | ICD-10-CM | POA: Diagnosis not present

## 2019-03-28 DIAGNOSIS — L89154 Pressure ulcer of sacral region, stage 4: Secondary | ICD-10-CM | POA: Diagnosis not present

## 2019-03-28 DIAGNOSIS — I5032 Chronic diastolic (congestive) heart failure: Secondary | ICD-10-CM | POA: Diagnosis not present

## 2019-03-28 DIAGNOSIS — I4821 Permanent atrial fibrillation: Secondary | ICD-10-CM | POA: Diagnosis not present

## 2019-03-28 DIAGNOSIS — I110 Hypertensive heart disease with heart failure: Secondary | ICD-10-CM | POA: Diagnosis not present

## 2019-03-28 DIAGNOSIS — E1144 Type 2 diabetes mellitus with diabetic amyotrophy: Secondary | ICD-10-CM | POA: Diagnosis not present

## 2019-03-28 DIAGNOSIS — E876 Hypokalemia: Secondary | ICD-10-CM | POA: Diagnosis not present

## 2019-03-29 DIAGNOSIS — I110 Hypertensive heart disease with heart failure: Secondary | ICD-10-CM | POA: Diagnosis not present

## 2019-03-29 DIAGNOSIS — E876 Hypokalemia: Secondary | ICD-10-CM | POA: Diagnosis not present

## 2019-03-29 DIAGNOSIS — L89154 Pressure ulcer of sacral region, stage 4: Secondary | ICD-10-CM | POA: Diagnosis not present

## 2019-03-29 DIAGNOSIS — E1144 Type 2 diabetes mellitus with diabetic amyotrophy: Secondary | ICD-10-CM | POA: Diagnosis not present

## 2019-03-29 DIAGNOSIS — I5032 Chronic diastolic (congestive) heart failure: Secondary | ICD-10-CM | POA: Diagnosis not present

## 2019-03-29 DIAGNOSIS — I4821 Permanent atrial fibrillation: Secondary | ICD-10-CM | POA: Diagnosis not present

## 2019-03-31 DIAGNOSIS — L89154 Pressure ulcer of sacral region, stage 4: Secondary | ICD-10-CM | POA: Diagnosis not present

## 2019-03-31 DIAGNOSIS — I110 Hypertensive heart disease with heart failure: Secondary | ICD-10-CM | POA: Diagnosis not present

## 2019-03-31 DIAGNOSIS — E876 Hypokalemia: Secondary | ICD-10-CM | POA: Diagnosis not present

## 2019-03-31 DIAGNOSIS — E1144 Type 2 diabetes mellitus with diabetic amyotrophy: Secondary | ICD-10-CM | POA: Diagnosis not present

## 2019-03-31 DIAGNOSIS — I4821 Permanent atrial fibrillation: Secondary | ICD-10-CM | POA: Diagnosis not present

## 2019-03-31 DIAGNOSIS — I5032 Chronic diastolic (congestive) heart failure: Secondary | ICD-10-CM | POA: Diagnosis not present

## 2019-04-02 ENCOUNTER — Emergency Department (HOSPITAL_COMMUNITY): Payer: Medicare Other

## 2019-04-02 ENCOUNTER — Inpatient Hospital Stay (HOSPITAL_COMMUNITY)
Admission: EM | Admit: 2019-04-02 | Discharge: 2019-04-07 | DRG: 871 | Disposition: A | Discharge status: Home Health | Payer: Medicare Other | Attending: Family Medicine | Admitting: Family Medicine

## 2019-04-02 DIAGNOSIS — Z794 Long term (current) use of insulin: Secondary | ICD-10-CM

## 2019-04-02 DIAGNOSIS — G9341 Metabolic encephalopathy: Secondary | ICD-10-CM | POA: Diagnosis present

## 2019-04-02 DIAGNOSIS — Z20822 Contact with and (suspected) exposure to covid-19: Secondary | ICD-10-CM | POA: Diagnosis not present

## 2019-04-02 DIAGNOSIS — Z79890 Hormone replacement therapy (postmenopausal): Secondary | ICD-10-CM

## 2019-04-02 DIAGNOSIS — G2581 Restless legs syndrome: Secondary | ICD-10-CM | POA: Diagnosis present

## 2019-04-02 DIAGNOSIS — Z743 Need for continuous supervision: Secondary | ICD-10-CM | POA: Diagnosis not present

## 2019-04-02 DIAGNOSIS — E876 Hypokalemia: Secondary | ICD-10-CM | POA: Diagnosis present

## 2019-04-02 DIAGNOSIS — I5032 Chronic diastolic (congestive) heart failure: Secondary | ICD-10-CM | POA: Diagnosis present

## 2019-04-02 DIAGNOSIS — Z8249 Family history of ischemic heart disease and other diseases of the circulatory system: Secondary | ICD-10-CM

## 2019-04-02 DIAGNOSIS — Z91018 Allergy to other foods: Secondary | ICD-10-CM

## 2019-04-02 DIAGNOSIS — R197 Diarrhea, unspecified: Secondary | ICD-10-CM | POA: Diagnosis not present

## 2019-04-02 DIAGNOSIS — R6521 Severe sepsis with septic shock: Secondary | ICD-10-CM | POA: Diagnosis present

## 2019-04-02 DIAGNOSIS — A419 Sepsis, unspecified organism: Secondary | ICD-10-CM | POA: Diagnosis not present

## 2019-04-02 DIAGNOSIS — E1169 Type 2 diabetes mellitus with other specified complication: Secondary | ICD-10-CM | POA: Diagnosis present

## 2019-04-02 DIAGNOSIS — L89159 Pressure ulcer of sacral region, unspecified stage: Secondary | ICD-10-CM | POA: Diagnosis not present

## 2019-04-02 DIAGNOSIS — Z91030 Bee allergy status: Secondary | ICD-10-CM

## 2019-04-02 DIAGNOSIS — L0291 Cutaneous abscess, unspecified: Secondary | ICD-10-CM

## 2019-04-02 DIAGNOSIS — R404 Transient alteration of awareness: Secondary | ICD-10-CM | POA: Diagnosis not present

## 2019-04-02 DIAGNOSIS — L89153 Pressure ulcer of sacral region, stage 3: Secondary | ICD-10-CM | POA: Diagnosis not present

## 2019-04-02 DIAGNOSIS — F411 Generalized anxiety disorder: Secondary | ICD-10-CM | POA: Diagnosis present

## 2019-04-02 DIAGNOSIS — Z888 Allergy status to other drugs, medicaments and biological substances status: Secondary | ICD-10-CM

## 2019-04-02 DIAGNOSIS — Z885 Allergy status to narcotic agent status: Secondary | ICD-10-CM

## 2019-04-02 DIAGNOSIS — R32 Unspecified urinary incontinence: Secondary | ICD-10-CM | POA: Diagnosis present

## 2019-04-02 DIAGNOSIS — M4628 Osteomyelitis of vertebra, sacral and sacrococcygeal region: Secondary | ICD-10-CM | POA: Diagnosis present

## 2019-04-02 DIAGNOSIS — I4891 Unspecified atrial fibrillation: Secondary | ICD-10-CM | POA: Diagnosis not present

## 2019-04-02 DIAGNOSIS — Z993 Dependence on wheelchair: Secondary | ICD-10-CM

## 2019-04-02 DIAGNOSIS — R109 Unspecified abdominal pain: Secondary | ICD-10-CM | POA: Diagnosis not present

## 2019-04-02 DIAGNOSIS — Z7901 Long term (current) use of anticoagulants: Secondary | ICD-10-CM

## 2019-04-02 DIAGNOSIS — R29818 Other symptoms and signs involving the nervous system: Secondary | ICD-10-CM | POA: Diagnosis not present

## 2019-04-02 DIAGNOSIS — I110 Hypertensive heart disease with heart failure: Secondary | ICD-10-CM | POA: Diagnosis present

## 2019-04-02 DIAGNOSIS — R0902 Hypoxemia: Secondary | ICD-10-CM | POA: Diagnosis not present

## 2019-04-02 DIAGNOSIS — N390 Urinary tract infection, site not specified: Secondary | ICD-10-CM | POA: Diagnosis present

## 2019-04-02 DIAGNOSIS — Z881 Allergy status to other antibiotic agents status: Secondary | ICD-10-CM

## 2019-04-02 DIAGNOSIS — B961 Klebsiella pneumoniae [K. pneumoniae] as the cause of diseases classified elsewhere: Secondary | ICD-10-CM | POA: Diagnosis present

## 2019-04-02 DIAGNOSIS — L89324 Pressure ulcer of left buttock, stage 4: Secondary | ICD-10-CM

## 2019-04-02 DIAGNOSIS — Z95828 Presence of other vascular implants and grafts: Secondary | ICD-10-CM

## 2019-04-02 DIAGNOSIS — I4821 Permanent atrial fibrillation: Secondary | ICD-10-CM | POA: Diagnosis not present

## 2019-04-02 DIAGNOSIS — F329 Major depressive disorder, single episode, unspecified: Secondary | ICD-10-CM | POA: Diagnosis present

## 2019-04-02 DIAGNOSIS — G4733 Obstructive sleep apnea (adult) (pediatric): Secondary | ICD-10-CM | POA: Diagnosis present

## 2019-04-02 DIAGNOSIS — L8990 Pressure ulcer of unspecified site, unspecified stage: Secondary | ICD-10-CM | POA: Insufficient documentation

## 2019-04-02 DIAGNOSIS — M797 Fibromyalgia: Secondary | ICD-10-CM | POA: Diagnosis present

## 2019-04-02 DIAGNOSIS — Z79899 Other long term (current) drug therapy: Secondary | ICD-10-CM

## 2019-04-02 DIAGNOSIS — Z87440 Personal history of urinary (tract) infections: Secondary | ICD-10-CM

## 2019-04-02 DIAGNOSIS — L02212 Cutaneous abscess of back [any part, except buttock]: Secondary | ICD-10-CM

## 2019-04-02 DIAGNOSIS — G459 Transient cerebral ischemic attack, unspecified: Secondary | ICD-10-CM | POA: Diagnosis not present

## 2019-04-02 DIAGNOSIS — R4789 Other speech disturbances: Secondary | ICD-10-CM | POA: Diagnosis not present

## 2019-04-02 DIAGNOSIS — R05 Cough: Secondary | ICD-10-CM | POA: Diagnosis not present

## 2019-04-02 DIAGNOSIS — L02211 Cutaneous abscess of abdominal wall: Secondary | ICD-10-CM | POA: Diagnosis not present

## 2019-04-02 DIAGNOSIS — E1165 Type 2 diabetes mellitus with hyperglycemia: Secondary | ICD-10-CM | POA: Diagnosis not present

## 2019-04-02 DIAGNOSIS — B965 Pseudomonas (aeruginosa) (mallei) (pseudomallei) as the cause of diseases classified elsewhere: Secondary | ICD-10-CM | POA: Diagnosis present

## 2019-04-02 LAB — COMPREHENSIVE METABOLIC PANEL
ALT: 9 U/L (ref 0–44)
AST: 26 U/L (ref 15–41)
Albumin: 3.7 g/dL (ref 3.5–5.0)
Alkaline Phosphatase: 81 U/L (ref 38–126)
Anion gap: 13 (ref 5–15)
BUN: 17 mg/dL (ref 8–23)
CO2: 25 mmol/L (ref 22–32)
Calcium: 9.1 mg/dL (ref 8.9–10.3)
Chloride: 98 mmol/L (ref 98–111)
Creatinine, Ser: 1.14 mg/dL — ABNORMAL HIGH (ref 0.44–1.00)
GFR calc Af Amer: 57 mL/min — ABNORMAL LOW (ref >60)
GFR calc non Af Amer: 49 mL/min — ABNORMAL LOW (ref >60)
Glucose, Bld: 284 mg/dL — ABNORMAL HIGH (ref 70–99)
Potassium: 3.3 mmol/L — ABNORMAL LOW (ref 3.5–5.1)
Sodium: 136 mmol/L (ref 135–145)
Total Bilirubin: 1.5 mg/dL — ABNORMAL HIGH (ref 0.3–1.2)
Total Protein: 7.4 g/dL (ref 6.5–8.1)

## 2019-04-02 LAB — POC SARS CORONAVIRUS 2 AG -  ED: SARS Coronavirus 2 Ag: NEGATIVE

## 2019-04-02 LAB — POCT I-STAT EG7
Acid-Base Excess: 3 mmol/L — ABNORMAL HIGH (ref 0.0–2.0)
Bicarbonate: 27.1 mmol/L (ref 20.0–28.0)
Calcium, Ion: 1.08 mmol/L — ABNORMAL LOW (ref 1.15–1.40)
HCT: 40 % (ref 36.0–46.0)
Hemoglobin: 13.6 g/dL (ref 12.0–15.0)
O2 Saturation: 94 %
Potassium: 3.1 mmol/L — ABNORMAL LOW (ref 3.5–5.1)
Sodium: 138 mmol/L (ref 135–145)
TCO2: 28 mmol/L (ref 22–32)
pCO2, Ven: 37.8 mmHg — ABNORMAL LOW (ref 44.0–60.0)
pH, Ven: 7.463 — ABNORMAL HIGH (ref 7.250–7.430)
pO2, Ven: 66 mmHg — ABNORMAL HIGH (ref 32.0–45.0)

## 2019-04-02 LAB — DIFFERENTIAL
Abs Immature Granulocytes: 0.04 10*3/uL (ref 0.00–0.07)
Basophils Absolute: 0 10*3/uL (ref 0.0–0.1)
Basophils Relative: 0 %
Eosinophils Absolute: 0 10*3/uL (ref 0.0–0.5)
Eosinophils Relative: 0 %
Immature Granulocytes: 0 %
Lymphocytes Relative: 3 %
Lymphs Abs: 0.4 10*3/uL — ABNORMAL LOW (ref 0.7–4.0)
Monocytes Absolute: 0.3 10*3/uL (ref 0.1–1.0)
Monocytes Relative: 2 %
Neutro Abs: 11.7 10*3/uL — ABNORMAL HIGH (ref 1.7–7.7)
Neutrophils Relative %: 95 %

## 2019-04-02 LAB — I-STAT CHEM 8, ED
BUN: 17 mg/dL (ref 8–23)
Calcium, Ion: 1.08 mmol/L — ABNORMAL LOW (ref 1.15–1.40)
Chloride: 97 mmol/L — ABNORMAL LOW (ref 98–111)
Creatinine, Ser: 0.9 mg/dL (ref 0.44–1.00)
Glucose, Bld: 276 mg/dL — ABNORMAL HIGH (ref 70–99)
HCT: 41 % (ref 36.0–46.0)
Hemoglobin: 13.9 g/dL (ref 12.0–15.0)
Potassium: 3.3 mmol/L — ABNORMAL LOW (ref 3.5–5.1)
Sodium: 137 mmol/L (ref 135–145)
TCO2: 27 mmol/L (ref 22–32)

## 2019-04-02 LAB — APTT: aPTT: 38 seconds — ABNORMAL HIGH (ref 24–36)

## 2019-04-02 LAB — PROTIME-INR
INR: 2.2 — ABNORMAL HIGH (ref 0.8–1.2)
Prothrombin Time: 24.4 seconds — ABNORMAL HIGH (ref 11.4–15.2)

## 2019-04-02 LAB — CBG MONITORING, ED: Glucose-Capillary: 260 mg/dL — ABNORMAL HIGH (ref 70–99)

## 2019-04-02 LAB — CBC
HCT: 40.4 % (ref 36.0–46.0)
Hemoglobin: 11.9 g/dL — ABNORMAL LOW (ref 12.0–15.0)
MCH: 23.4 pg — ABNORMAL LOW (ref 26.0–34.0)
MCHC: 29.5 g/dL — ABNORMAL LOW (ref 30.0–36.0)
MCV: 79.5 fL — ABNORMAL LOW (ref 80.0–100.0)
Platelets: 207 10*3/uL (ref 150–400)
RBC: 5.08 MIL/uL (ref 3.87–5.11)
RDW: 19.6 % — ABNORMAL HIGH (ref 11.5–15.5)
WBC: 12.5 10*3/uL — ABNORMAL HIGH (ref 4.0–10.5)
nRBC: 0 % (ref 0.0–0.2)

## 2019-04-02 LAB — LACTIC ACID, PLASMA: Lactic Acid, Venous: 2.4 mmol/L (ref 0.5–1.9)

## 2019-04-02 LAB — AMMONIA: Ammonia: 37 umol/L — ABNORMAL HIGH (ref 9–35)

## 2019-04-02 MED ORDER — METRONIDAZOLE IN NACL 5-0.79 MG/ML-% IV SOLN
500.0000 mg | Freq: Once | INTRAVENOUS | Status: AC
  Administered 2019-04-02: 22:00:00 500 mg via INTRAVENOUS
  Filled 2019-04-02: qty 100

## 2019-04-02 MED ORDER — VANCOMYCIN HCL IN DEXTROSE 1-5 GM/200ML-% IV SOLN
1000.0000 mg | Freq: Once | INTRAVENOUS | Status: AC
  Administered 2019-04-02: 22:00:00 1000 mg via INTRAVENOUS
  Filled 2019-04-02: qty 200

## 2019-04-02 MED ORDER — SODIUM CHLORIDE 0.9 % IV BOLUS (SEPSIS)
200.0000 mL | Freq: Once | INTRAVENOUS | Status: AC
  Administered 2019-04-02: 23:00:00 200 mL via INTRAVENOUS

## 2019-04-02 MED ORDER — SODIUM CHLORIDE 0.9 % IV BOLUS (SEPSIS)
1000.0000 mL | Freq: Once | INTRAVENOUS | Status: AC
  Administered 2019-04-02: 22:00:00 1000 mL via INTRAVENOUS

## 2019-04-02 MED ORDER — SODIUM CHLORIDE 0.9 % IV BOLUS (SEPSIS)
1000.0000 mL | Freq: Once | INTRAVENOUS | Status: AC
  Administered 2019-04-02: 1000 mL via INTRAVENOUS

## 2019-04-02 MED ORDER — SODIUM CHLORIDE 0.9% FLUSH
3.0000 mL | Freq: Once | INTRAVENOUS | Status: AC
  Administered 2019-04-02: 22:00:00 3 mL via INTRAVENOUS

## 2019-04-02 MED ORDER — SODIUM CHLORIDE 0.9 % IV SOLN
2.0000 g | Freq: Once | INTRAVENOUS | Status: AC
  Administered 2019-04-02: 22:00:00 2 g via INTRAVENOUS
  Filled 2019-04-02: qty 2

## 2019-04-02 MED ORDER — ACETAMINOPHEN 650 MG RE SUPP
650.0000 mg | Freq: Once | RECTAL | Status: AC
  Administered 2019-04-02: 23:00:00 650 mg via RECTAL
  Filled 2019-04-02: qty 1

## 2019-04-02 NOTE — Consult Note (Signed)
Requesting Physician: Dr. Ephriam Jenkins PA-C    Chief Complaint: Altered mental status, generalized shaking  History obtained from: Patient and Chart     HPI:                                                                                                                                       Donna [name] is a 69 y.o. female with past medical history significant for atrial fibrillation on warfarin, chronic diastolic heart failure, diabetes, obstructive sleep apnea, morbid obesity presents to the emergency department as a code stroke for possible aphasia.  Last known normal was around 8.30PM, according to [facility] patient was noted to be shaking in her chair while speaking to her husband.  During the shaking, patient was awake and responsive.  Husband called EMS and on their arrival patient appeared slightly confused and had difficulty repeating sentences, was not oriented to time.  Blood pressure was around 793 systolic and patient also noted to be febrile with temperature of 102 per EMS.  On arrival to [facility], ER, patient alert and oriented x4, slightly slow in responding.  No focal deficits.  Stat CT head was unremarkable for acute findings.  Not a candidate for TPA due to low suspicion for stroke as well as patient being on anticoagulation.   Date last known well: 04/02/2019 Time last known well: 8.30 PM tPA Given: No, patient on anticoagulation low suspicion for stroke with mild neuro deficits NIHSS: 2  Baseline MRS 0    Past Medical History:  Diagnosis Date  . CHF (congestive heart failure) ([place])   . Chronic /permanent atrial fibrillation ([place]) 2005   On Warfarin (follwed @ [facility]); Rate controlled - On BB  . Chronic diastolic heart failure, NYHA class 2 ([place])    Updated by A. fib, hypertensive heart disease and obesity; EDP was only 7 by cardiac catheterization in September 2017  . Diabetic lumbosacral plexopathy ([place])   . Fibromyalgia   .  Generalized anxiety disorder   . Hypokalemia 09/03/2017  . Incontinence   . Major depressive disorder   . OSA on CPAP   . Restless leg syndrome   . Syncope 09/03/2017  . Type II diabetes mellitus ([place])     Past Surgical History:  Procedure Laterality Date  . CARDIAC CATHETERIZATION  11/09/2015   [facility]: Nonocclusive CAD. EF 55%. EDP 7 mmHg.  Marland Kitchen CHOLECYSTECTOMY OPEN    . JOINT REPLACEMENT    . KNEE ARTHROSCOPY Right   . MASS EXCISION Left    sarcoma - shoulder  . SHOULDER OPEN ROTATOR CUFF REPAIR Bilateral   . TONSILLECTOMY    . TOTAL HIP ARTHROPLASTY Right 2015  . TOTAL HIP REVISION Right 2015  . TOTAL KNEE ARTHROPLASTY Right   . TRANSTHORACIC ECHOCARDIOGRAM  11/2013; 02/2016   a) [facility]: with [place]:  EF 60-65%. Mod LA dilation. Mild  AoV sclerosis. Normal RHP. Indeterminate LV filling pressures. b) [facility]: Normal cavity size. EF 55-60%.no RWMA, mild AoV sclerosis (R cusp), Mod MAC. Mild biAtrial dilation.  . TRANSTHORACIC ECHOCARDIOGRAM  08/2017    Mild LVH with normal EF 55-60%.  Unable to fully assess wall motion, but appear to be normal.  Unable to determine diastolic function because of A. fib.  Mild to moderate MAC with mild mitral stenosis (mean gradient 9 mmHg).  Moderate LA dilation.  CVP estimated 3 mmHg.  No obvious evidence of pulmonary hypertension.  Marland Kitchen VAGINAL HYSTERECTOMY      Family History  Problem Relation Age of Onset  . Hypertension Mother   . Heart disease Mother   . Leukemia Son    Social History:  reports that she has never smoked. She has never used smokeless tobacco. She reports that she does not drink alcohol or use drugs.  Allergies:  Allergies  Allergen Reactions  . Bee Venom Hives    Takes benadryl  . Other Other (See Comments)    Raw foods with seeds give her "boils".  Avoids raw strawberries, blueberries. Tolerates cooked fruits, tomato sauce, bread/grains with seeds. [facility] 10/17/13 Berries with seeds Allergy to  glutamine-C-quercet-selen-brom per [facility] 09/25/17  . Gluten Meal Other (See Comments)    Per MAR  . Alteplase Rash  . Cefepime Rash  . Fentanyl Other (See Comments)    Hallucinations, syncope    . Metformin And Related Diarrhea    Medications:                                                                                                                        I reviewed home medications   ROS:                                                                                                                                     14 systems reviewed and negative except above    Examination:  General: Appears well-developed, morbidly obese Psych: Affect appropriate to situation Eyes: No scleral injection HENT: No OP obstrucion Head: Normocephalic.  Cardiovascular: Normal rate and regular rhythm.  Respiratory: Effort normal and breath sounds normal to anterior ascultation GI: Soft.  No distension. There is no tenderness.  Skin: WDI    Neurological Examination Mental Status: Alert, oriented, thought content appropriate.  Speech fluent without evidence of aphasia.  Patient is slightly confused and having slowness in thought process.  Able to follow 3 step commands without difficulty. Cranial Nerves: II: Visual fields grossly normal,  III,IV, VI: ptosis not present, extra-ocular motions intact bilaterally, pupils equal, round, reactive to light and accommodation V,VII: smile symmetric, facial light touch sensation normal bilaterally VIII: hearing normal bilaterally IX,X: uvula rises symmetrically XI: bilateral shoulder shrug XII: midline tongue extension Motor: Right : Upper extremity   5/5    Left:     Upper extremity   5/5  Lower extremity   5/5     Lower extremity   5/5 Tone and bulk:normal tone throughout; no atrophy noted Sensory: Pinprick and light touch intact throughout,  bilaterally Deep Tendon Reflexes: 2+ and symmetric throughout Plantars: Right: downgoing   Left: downgoing Cerebellar: normal finger-to-nose normal bilaterally      Lab Results: Basic Metabolic Panel: Recent Labs  Lab 04/02/19 Apr 18, 2098 04/02/19 18-Apr-2110 04/02/19 2142  NA 136 137 138  K 3.3* 3.3* 3.1*  CL 98 97*  --   CO2 25  --   --   GLUCOSE 284* 276*  --   BUN 17 17  --   CREATININE 1.14* 0.90  --   CALCIUM 9.1  --   --     CBC: Recent Labs  Lab 04/02/19 04/18/2098 04/02/19 04-18-2110 04/02/19 2142  WBC 12.5*  --   --   NEUTROABS 11.7*  --   --   HGB 11.9* 13.9 13.6  HCT 40.4 41.0 40.0  MCV 79.5*  --   --   PLT 207  --   --     Coagulation Studies: Recent Labs    04/02/19 04-18-98  LABPROT 24.4*  INR 2.2*    Imaging: DG Chest Port 1 View  Result Date: 04/02/2019 CLINICAL DATA:  Cough.  Hypoxia. EXAM: PORTABLE CHEST 1 VIEW COMPARISON:  01/28/2019 FINDINGS: The heart size is enlarged. Aortic calcifications are noted. There is no pneumothorax. No large pleural effusion. No focal infiltrate. Degenerative changes are noted of both glenohumeral joints and both AC joints. There is no obvious acute osseous abnormality. IMPRESSION: No active disease. Electronically Signed   By: Constance Holster M.D.   On: 04/02/2019 22:37   CT HEAD CODE STROKE WO CONTRAST  Result Date: 04/02/2019 CLINICAL DATA:  Code stroke. 69 year old female with abnormal speech. EXAM: CT HEAD WITHOUT CONTRAST TECHNIQUE: Contiguous axial images were obtained from the base of the skull through the vertex without intravenous contrast. COMPARISON:  Brain MRI 06/18/2011. Head CT 09/24/2017. FINDINGS: Brain: Chronic partially empty sella. Stable cerebral volume. No midline shift, ventriculomegaly, mass effect, evidence of mass lesion, intracranial hemorrhage or evidence of cortically based acute infarction. Mild for age scattered white matter hypodensity appears stable. No cortical encephalomalacia identified. Vascular:  Calcified atherosclerosis at the skull base. No suspicious intracranial vascular hyperdensity. Skull: No acute osseous abnormality identified. Sinuses/Orbits: Visualized paranasal sinuses and mastoids are stable and well pneumatized. Other: No acute orbit or scalp soft tissue finding. ASPECTS [facility] Stroke Program Early CT Score) Total score (0-10 with 10 being normal): 10 IMPRESSION: Stable  since 2019 and largely negative for age noncontrast CT appearance of the brain. ASPECTS 10. These results were communicated to Dr. Lorraine Lax at 9:16 pm on 1/30/2021by text page via the [facility] messaging system. Electronically Signed   By: Genevie Ann M.D.   On: 04/02/2019 21:17     ASSESSMENT AND PLAN  69 y.o. female with past medical history significant for atrial fibrillation on warfarin, chronic diastolic heart failure, diabetes, obstructive sleep apnea, morbid obesity presents to the emergency department as a code stroke for possible aphasia.  On arrival to [facility] patient alert oriented x4, no difficulty naming objects, repetition, interpreting commands or completing sentences.  Patient febrile to 102, as patient awake and alert during the shaking spells possible that they are rigors.  Acute metabolic encephalopathy Fever undergoing evaluation  Recommendations -Work-up for sepsis including UA, chest x-ray, SARS-CoV-2 infection, blood cultures -Check ammonia level, electrolytes -Low suspicion for stroke, if no alternative explanation for encephalopathy then consider MRI brain.  Workup reveals UTI, started on antibiotics. Neurology will be available as needed.   Afua Hoots [facility] Pager Number [phone]

## 2019-04-02 NOTE — ED Triage Notes (Signed)
Pt arrives via [facility] from home.   Pt was speaking while shaking in chair, after shaking stopped, pt had difficulty speaking and repetition. No droop or weakness reported.   Upon arrival pt a&ox4 with no difficulty speaking,no droop, and no weakness, but pt has no recall of events earlier today.

## 2019-04-02 NOTE — ED Provider Notes (Signed)
This patient is a very ill-appearing and critically ill-appearing female, 69 years old presenting febrile tachycardic hypotensive with altered mental status, there was some shaking, some difficulty speaking and repetition, a code stroke was activated prehospital however the patient was found to be febrile on arrival.  On my exam the patient is dry the mucous membranes, she is morbidly obese and has a mildly diffusely tender abdomen, she is tachycardic, she is hypoxic to 88%, she has no peripheral edema and is able to follow commands but is somnolent.  She is arousable to voice.  She is able to lift both hands with normal strength, both legs with normal strength, her speech is slow and occasionally gives wrong answers but is thoughtful and occasionally gives right answers.  The patient definitely appears to be septic and likely in severe sepsis or shock.  She will need 30 cc/kg of IV fluids, antibiotics, the source is not clear, she is tachypneic and hypoxic so assuming that this is pulmonary in origin.  With clear lungs it is not exactly clear and she will need an in and out catheterization for urine.  Antipyretics.  Broad-spectrum antibiotics and admission to the hospital.  .Critical Care Performed by: Noemi Chapel, MD Authorized by: Noemi Chapel, MD   Critical care provider statement:    Critical care time (minutes):  35   Critical care time was exclusive of:  Separately billable procedures and treating other patients and teaching time   Critical care was necessary to treat or prevent imminent or life-threatening deterioration of the following conditions:  Sepsis   Critical care was time spent personally by me on the following activities:  Blood draw for specimens, development of treatment plan with patient or surrogate, discussions with consultants, evaluation of patient's response to treatment, examination of patient, obtaining history from patient or surrogate, ordering and performing treatments  and interventions, ordering and review of laboratory studies, ordering and review of radiographic studies, pulse oximetry, re-evaluation of patient's condition and review of old charts     Medical screening examination/treatment/procedure(s) were conducted as a shared visit with non-physician practitioner(s) and myself.  I personally evaluated the patient during the encounter.  Clinical Impression:   Final diagnoses:  Sepsis, due to unspecified organism, unspecified whether acute organ dysfunction present [facility])  Abscess  Pressure injury of skin of sacral region, unspecified injury stage         Noemi Chapel, MD 04/03/19 2141

## 2019-04-02 NOTE — ED Provider Notes (Addendum)
[facility] EMERGENCY DEPARTMENT Provider Note   CSN: [id number] Arrival date & time: 04/02/19  2101     History Chief Complaint  Patient presents with  . Code Stroke    DAEJAH KLEBBA is a 69 y.o. female.  HPI   Patient is a 69 year old female with history of CHF, permanent A. Fib (on Coumadin), OSA on CPAP, syncope, diabetes, who presents emergency department today for evaluation as a code stroke.  Per EMS, patient was sitting in speaking with her husband around 64 tonight when she experienced an episode of generalized shaking.  She was not having any rigid muscle movements.  During the episode she was talking throughout the entire time and was able to make eye contact.  On EMS arrival patient was satting 88% on room air and had a fever to 102F.  She seemed confused on arrival and had some difficulty with repetition and some possible difficulty with word finding but she did not have any focal neuro deficits on their evaluation.  Level 5 caveat due to patient being somewhat confused.  She states she has had a cough for some time.  She also has had some diarrhea.  She initially describes some abdominal pain and later stated she did not have any abdominal pain.  She is had no vomiting.  She has felt some shortness of breath.  She did not know she had any fevers.  9:22 PM Spoke with the patient's spouse, Annise Boran, who states that patient started trembling around 7:30 PM tonight. The shaking gradually worsened. He also states that she was breathing fast and noticed that her heart rate was increased. He noted that she was confused, and during the shaking episode she was talking to her mother who he states has passed away many years ago. She has had issues with confusion for the last year. He denies any known fevers, cough, or complaints of sob. He notes that she has had diarrhea for the last week and has been complaining of some abd pain. She has a h/o chronic abd pain. He denies  any known COVID exposures. She is not on oxygen at home.   Past Medical History:  Diagnosis Date  . CHF (congestive heart failure) ([place])   . Chronic /permanent atrial fibrillation ([place]) 2005   On Warfarin (follwed @ [facility]); Rate controlled - On BB  . Chronic diastolic heart failure, NYHA class 2 ([place])    Updated by A. fib, hypertensive heart disease and obesity; EDP was only 7 by cardiac catheterization in September 2017  . Diabetic lumbosacral plexopathy ([place])   . Fibromyalgia   . Generalized anxiety disorder   . Hypokalemia 09/03/2017  . Incontinence   . Major depressive disorder   . OSA on CPAP   . Restless leg syndrome   . Syncope 09/03/2017  . Type II diabetes mellitus [facility])     Patient Active Problem List   Diagnosis Date Noted  . Orthostatic hypotension   . Leukocytosis 11/13/2018  . Hypomagnesemia 02/02/2018  . Preop cardiovascular exam 02/02/2018  . Decubitus ulcer of ischium, left, stage IV ([place]) 09/28/2017  . Atrial fibrillation with RVR ([place]) 09/25/2017  . Cellulitis of sacral region 09/25/2017  . Normocytic anemia 09/25/2017  . Type 2 diabetes mellitus ([place]) 09/25/2017  . Chronic pain 09/25/2017  . Chronic atrial fibrillation with RVR   . History of seizures 09/03/2017  . Hypokalemia 09/03/2017  . Depression 02/09/2017  . Anxiety 02/09/2017  . Venous  stasis of both lower extremities 07/25/2016  . Essential hypertension 07/25/2016  . Anticoagulant long-term use 07/15/2016  . Anatomical narrow angle borderline glaucoma of both eyes 04/02/2016  . Chronic diastolic CHF (congestive heart failure) ([place]) 02/08/2016  . Chronic atrial fibrillation ([place]): CHA2DS2-VASc Score 5; on Warfarin 02/08/2016  . Morbid obesity ([place]) 02/08/2016  . OSA (obstructive sleep apnea) 02/08/2016  . Pre-operative cardiovascular examination 02/08/2016  . Medication management 02/08/2016  . Fibromyalgia 08/07/2012  . Restless legs syndrome 08/07/2012     Past Surgical History:  Procedure Laterality Date  . CARDIAC CATHETERIZATION  11/09/2015   [facility]: Nonocclusive CAD. EF 55%. EDP 7 mmHg.  Marland Kitchen CHOLECYSTECTOMY OPEN    . JOINT REPLACEMENT    . KNEE ARTHROSCOPY Right   . MASS EXCISION Left    sarcoma - shoulder  . SHOULDER OPEN ROTATOR CUFF REPAIR Bilateral   . TONSILLECTOMY    . TOTAL HIP ARTHROPLASTY Right 2015  . TOTAL HIP REVISION Right 2015  . TOTAL KNEE ARTHROPLASTY Right   . TRANSTHORACIC ECHOCARDIOGRAM  11/2013; 02/2016   a) [facility]: with Definity:  EF 60-65%. Mod LA dilation. Mild AoV sclerosis. Normal RHP. Indeterminate LV filling pressures. b) [facility]: Normal cavity size. EF 55-60%.no RWMA, mild AoV sclerosis (R cusp), Mod MAC. Mild biAtrial dilation.  . TRANSTHORACIC ECHOCARDIOGRAM  08/2017    Mild LVH with normal EF 55-60%.  Unable to fully assess wall motion, but appear to be normal.  Unable to determine diastolic function because of A. fib.  Mild to moderate MAC with mild mitral stenosis (mean gradient 9 mmHg).  Moderate LA dilation.  CVP estimated 3 mmHg.  No obvious evidence of pulmonary hypertension.  Marland Kitchen VAGINAL HYSTERECTOMY       OB History   No obstetric history on file.     Family History  Problem Relation Age of Onset  . Hypertension Mother   . Heart disease Mother   . Leukemia Son     Social History   Tobacco Use  . Smoking status: Never Smoker  . Smokeless tobacco: Never Used  Substance Use Topics  . Alcohol use: No  . Drug use: No    Home Medications Prior to Admission medications   Medication Sig Start Date End Date Taking? Authorizing Provider  acetaminophen (TYLENOL) 500 MG tablet Take 500 mg by mouth every 6 (six) hours as needed for mild pain.     [provider]  ALPRAZolam Duanne Moron) 0.25 MG tablet Take 1 tablet (0.25 mg total) by mouth 3 (three) times daily. Patient taking differently: Take 0.25 mg by mouth 2 (two) times daily.  11/19/18   Domenic Polite, MD   ARIPiprazole (ABILIFY) 10 MG tablet Take 10 mg by mouth daily.    [provider]  ARIPiprazole (ABILIFY) 5 MG tablet Take 5 mg by mouth daily.    [provider]  B Complex Vitamins (B COMPLEX PO) Take 1 tablet by mouth daily.    [provider]  busPIRone (BUSPAR) 10 MG tablet Take 10 mg by mouth 2 (two) times daily.     [provider]  cholecalciferol (VITAMIN D3) 25 MCG (1000 UT) tablet Take 1,000 Units by mouth daily.    [provider]  docusate sodium (COLACE) 100 MG capsule Take 100 mg by mouth daily as needed for mild constipation.    [provider]  Dulaglutide (TRULICITY) 1.5 DS/2.8JG SOPN Inject 1.5 mg into the skin every Saturday.     [provider]  ezetimibe (ZETIA) 10 MG tablet Take 10 mg by mouth at bedtime.     [provider]  ferrous sulfate 325 (65 FE) MG tablet Take 325 mg by mouth See admin instructions. Take one tablet (325 mg) by mouth on Sunday, Monday, Wednesday and Friday mornings.    [provider]  furosemide (LASIX) 20 MG tablet Take 1 tablet (20 mg total) by mouth daily. 11/19/18   Domenic Polite, MD  gabapentin (NEURONTIN) 300 MG capsule Take 1 capsule (300 mg total) by mouth 3 (three) times daily. 11/19/18   Domenic Polite, MD  guaiFENesin (MUCINEX) 600 MG 12 hr tablet Take 600 mg by mouth every 12 (twelve) hours as needed for cough or to loosen phlegm.     [provider]  insulin detemir (LEVEMIR) 100 UNIT/ML injection Inject 0.4 mLs (40 Units total) into the skin at bedtime. Patient not taking: Reported on 01/28/2019 09/30/17   Georgette Shell, MD  Insulin Glargine [facility]) 100 UNIT/ML SOPN Inject 40 Units into the skin at bedtime.    [provider]  insulin regular (NOVOLIN R) 100 units/mL injection Inject 0-12 Units into the skin 3 (three) times daily before meals. 0-200 0 units 201-250 6 units 251-300 8 units 301-350 10 units 351-400 12  units Greater than 400 call MD    [provider]  lidocaine (LIDODERM) 5 % Place 1 patch onto the skin daily as needed (right hip pain). Remove & Discard patch within 12 hours or as directed by MD     [provider]  lisinopril (ZESTRIL) 10 MG tablet Take 10 mg by mouth daily.    [provider]  magnesium oxide (MAG-OX) 400 MG tablet Take 1 tablet (400 mg total) by mouth 3 (three) times daily. ALSO TAKE A EXTRA 400 MG TABLET ON THE DAYS YOU TAKE ZAROXOLYN TABLET 02/02/18   Leonie Man, MD  Melatonin 5 MG CAPS Take 5 mg by mouth at bedtime.    [provider]  metoprolol succinate (TOPROL-XL) 25 MG 24 hr tablet Take 1.5 tablets (37.5 mg total) by mouth daily. 11/20/18   Domenic Polite, MD  Multiple Vitamin (MULTIVITAMIN WITH MINERALS) TABS tablet Take 1 tablet by mouth daily.    [provider]  Nutritional Supplements (PROMOD) LIQD Take 30 mLs by mouth 2 (two) times daily.    [provider]  omeprazole (PRILOSEC) 20 MG capsule Take 20 mg by mouth daily.    [provider]  ondansetron (ZOFRAN) 4 MG tablet Take 1 tablet (4 mg total) by mouth every 6 (six) hours as needed for nausea. 09/30/17   Georgette Shell, MD  OxyCODONE HCl, Abuse Deter, (OXAYDO) 5 MG TABA Take 1 tablet by mouth 3 (three) times daily as needed (pain). Patient taking differently: Take 5 mg by mouth every 8 (eight) hours as needed (pain).  11/19/18   Domenic Polite, MD  potassium chloride SA (K-DUR) 20 MEQ tablet Take 2 tablets (40 mEq total) by mouth daily. TAKE AN EXTRA 20 MEQ ON THE DAYS YOU TAKE ZAROXLYN Patient taking differently: Take 20 mEq by mouth daily.  11/19/18   Domenic Polite, MD  pramipexole (MIRAPEX) 0.125 MG tablet Take 0.125 mg by mouth at bedtime.     [provider]  Quercetin 250 MG TABS Take 250 mg by mouth 2 (two) times daily.    [provider]  sertraline (ZOLOFT) 100 MG tablet Take 100 mg by mouth daily.     [provider]  spironolactone (ALDACTONE) 25 MG tablet Take 0.5 tablets (12.5 mg total) by mouth daily. 11/20/18   Domenic Polite, MD  tiZANidine (ZANAFLEX) 4 MG tablet Take 4 mg by mouth 3 (three) times daily.     [provider]  vitamin C (ASCORBIC ACID) 500 MG tablet Take 500 mg by mouth 2 (two) times daily.     [provider]  warfarin (COUMADIN) 6 MG tablet Take 6 mg by mouth See admin instructions. Take 55m daily on Monday, Tuesday, Thursday, Friday, Saturday    [provider]  warfarin (COUMADIN) 7.5 MG tablet Take 7.5 mg by mouth See admin instructions. Take 7.5 mg on Sunday and Wednesday    [provider]  zinc sulfate 220 (50 Zn) MG capsule Take 220 mg by mouth daily.    [provider]    Allergies    Bee venom, Other, Gluten meal, Alteplase, Cefepime, Fentanyl, and Metformin and related  Review of Systems   Review of Systems  Unable to perform ROS: Mental status change  Constitutional: Positive for fever.  Respiratory: Positive for cough and shortness of breath.   Cardiovascular: Negative for chest pain.  Gastrointestinal: Positive for abdominal pain and diarrhea. Negative for nausea and vomiting.  Skin: Negative for rash.  Neurological: Negative for headaches.  All other systems reviewed and are negative.   Physical Exam Updated Vital Signs BP 95/68   Pulse (!) 137   Temp (!) 102.2 F (39 C) (Rectal)   Resp 12   Ht _0  (1.803 m)   Wt 114.4 kg   SpO2 98%   BMI 35.18 kg/m   Physical Exam Vitals and nursing note reviewed.  Constitutional:      General: She is not in acute distress.    Appearance: She is well-developed.  HENT:     Head: Normocephalic and atraumatic.  Eyes:     Conjunctiva/sclera: Conjunctivae normal.  Cardiovascular:     Rate and Rhythm: Regular rhythm. Tachycardia present.     Heart sounds: No murmur.  Pulmonary:     Effort: Pulmonary effort is normal. No respiratory distress.       Breath sounds: Normal breath sounds. No wheezing, rhonchi or rales.  Abdominal:     Palpations: Abdomen is soft.     Tenderness: There is abdominal tenderness (LUQ, RUQ, RLQ).     Comments: BS decreased, ecchymosis to the abdomen  Genitourinary:    Comments: Chaperone present. White, mucous/cream? Noted outside the rectum. DRE performed. Light brown stool in the rectal vault. No fluctuance or ttp noted on exam. No open wounds noted to the rectal. Musculoskeletal:     Cervical back: Neck supple.  Skin:    General: Skin is warm and dry.     Comments: Sacral decubitus ulcer noted without significant surrounding erythema, fluctuance or drainage.   Neurological:     Mental Status: She is alert.     Comments: Mental Status:  Somnolent but easily arousable to voice, initially somewhat confused but later able to answer most questions appropriately, able to give a coherent history. Speech fluent without evidence of aphasia. Able to follow 2 step commands without difficulty.  Cranial Nerves:  II:  pupils equal, round, reactive to light III,IV, VI: ptosis not present, extra-ocular motions intact bilaterally  V,VII: smile symmetric, facial light touch sensation equal VIII: hearing grossly normal to voice  X: uvula elevates symmetrically  XI: bilateral shoulder shrug symmetric and strong XII: midline tongue extension without fassiculations Motor:  Normal tone. 5/5 strength of BUE and BLE major muscle groups including strong and equal grip strength and dorsiflexion/plantar flexion Sensory: light touch normal in all extremities. DTRs: patellar and achilles 2+ symmetric b/l Cerebellar: normal finger-to-nose with bilateral upper extremities Gait: not assessed  CV: DP pulses     ED Results / Procedures / Treatments   Labs (all labs ordered are listed, but only abnormal results are displayed) Labs Reviewed  PROTIME-INR - Abnormal; Notable for the following components:      Result Value    Prothrombin Time 24.4 (*)    INR 2.2 (*)    All other components within normal limits  APTT - Abnormal; Notable for the following components:   aPTT 38 (*)    All other components within normal limits  CBC - Abnormal; Notable for the following components:   WBC 12.5 (*)    Hemoglobin 11.9 (*)    MCV 79.5 (*)    MCH 23.4 (*)    MCHC 29.5 (*)    RDW 19.6 (*)    All other components within normal limits  DIFFERENTIAL - Abnormal; Notable for the following components:   Neutro Abs 11.7 (*)    Lymphs Abs 0.4 (*)    All other components within normal limits  COMPREHENSIVE METABOLIC PANEL - Abnormal; Notable for the following components:   Potassium 3.3 (*)    Glucose, Bld 284 (*)    Creatinine, Ser 1.14 (*)    Total Bilirubin 1.5 (*)    GFR calc non Af Amer 49 (*)    GFR calc Af Amer 57 (*)    All other components within normal limits  I-STAT CHEM 8, ED - Abnormal; Notable for the following components:   Potassium 3.3 (*)    Chloride 97 (*)    Glucose, Bld 276 (*)    Calcium, Ion 1.08 (*)    All other components within normal limits  CBG MONITORING, ED - Abnormal; Notable for the following components:   Glucose-Capillary 260 (*)    All other components within normal limits  POCT I-STAT EG7 - Abnormal; Notable for the following components:   pH, Ven 7.463 (*)    pCO2, Ven 37.8 (*)    pO2, Ven 66.0 (*)    Acid-Base Excess 3.0 (*)    Potassium 3.1 (*)    Calcium, Ion 1.08 (*)    All other components within normal limits  CULTURE, BLOOD (ROUTINE X 2)  CULTURE, BLOOD (ROUTINE X 2)  URINE CULTURE  AMMONIA  LACTIC ACID, PLASMA  LACTIC ACID, PLASMA  URINALYSIS, ROUTINE W REFLEX MICROSCOPIC  I-STAT VENOUS BLOOD GAS, ED  POC SARS CORONAVIRUS 2 AG -  ED    EKG None  Radiology CT HEAD CODE STROKE WO CONTRAST  Result Date: 04/02/2019 CLINICAL DATA:  Code stroke. 69 year old female with abnormal speech. EXAM: CT HEAD WITHOUT CONTRAST TECHNIQUE: Contiguous axial images were  obtained from the base of the skull through the vertex without intravenous contrast. COMPARISON:  Brain MRI 06/18/2011. Head CT 09/24/2017. FINDINGS: Brain: Chronic partially empty sella. Stable cerebral volume. No midline shift, ventriculomegaly, mass effect, evidence of mass lesion, intracranial hemorrhage or evidence of cortically based acute infarction. Mild for age scattered white matter hypodensity appears stable. No cortical encephalomalacia identified. Vascular: Calcified atherosclerosis at the skull base. No suspicious intracranial vascular hyperdensity. Skull: No acute osseous abnormality identified. Sinuses/Orbits: Visualized paranasal sinuses and mastoids are stable and well pneumatized. Other: No acute orbit or scalp soft tissue finding. ASPECTS (  [place] Stroke Program Early CT Score) Total score (0-10 with 10 being normal): 10 IMPRESSION: Stable since 2019 and largely negative for age noncontrast CT appearance of the brain. ASPECTS 10. These results were communicated to Dr. Lorraine Lax at 9:16 pm on 1/30/2021by text page via the [facility] messaging system. Electronically Signed   By: Genevie Ann M.D.   On: 04/02/2019 21:17    Procedures Procedures (including critical care time) CRITICAL CARE Performed by: Rodney Booze   Total critical care time: 40 minutes  Critical care time was exclusive of separately billable procedures and treating other patients.  Critical care was necessary to treat or prevent imminent or life-threatening deterioration.  Critical care was time spent personally by me on the following activities: development of treatment plan with patient and/or surrogate as well as nursing, discussions with consultants, evaluation of patient's response to treatment, examination of patient, obtaining history from patient or surrogate, ordering and performing treatments and interventions, ordering and review of laboratory studies, ordering and review of radiographic studies, pulse oximetry and  re-evaluation of patient's condition.   Medications Ordered in ED Medications  aztreonam (AZACTAM) 2 g in sodium chloride 0.9 % 100 mL IVPB (has no administration in time range)  metroNIDAZOLE (FLAGYL) IVPB 500 mg (500 mg Intravenous New Bag/Given 04/02/19 2153)  vancomycin (VANCOCIN) IVPB 1000 mg/200 mL premix (1,000 mg Intravenous New Bag/Given 04/02/19 2153)  sodium chloride flush (NS) 0.9 % injection 3 mL (3 mLs Intravenous Given 04/02/19 2159)    ED Course  I have reviewed the triage vital signs and the nursing notes.  Pertinent labs & imaging results that were available during my care of the patient were reviewed by me and considered in my medical decision making (see chart for details).    MDM Rules/Calculators/A&P                       69 y/o F presenting as code stroke. No focal deficits on arrival. Low suspicion for stroke.   Noted to be febrile, tachycardic, hypotensive and hypoxic. Placed on 2L and sats improved to 98%.  Code sepsis initiated, broad spectrum IV abx ordered and 30cc/kg IVF bolus ordered based on IBW.  CBC with mild leukocytosis at 12.5, anemia present but improved from prior CMP with mild hypokalemia, elevated blood glucose with normal CO2.  Mildly elevated creatinine 1.14 up from 0.87 on prior labs Coags with PT INR at 2.2, patient on Coumadin Lactic initially somewhat elevated to 2.4, repeat lactic pending at shift change Ammonia marginally elevated EG7 with slightly elevated pH of 7.463, low PCO2 noted at 37.8. UA pending at shift change  At shift change, pending UA, CT abd/pelvis and PCR COVID. Care transitioned to Quincy Carnes, PA-C with plan to f/u on pending w/u and admit for sepsis.   Final Clinical Impression(s) / ED Diagnoses Final diagnoses:  None    Rx / DC Orders ED Discharge Orders    None       Cyril Woodmansee S, PA-C 04/03/19 0000    Rodney Booze, PA-C 04/03/19 0001    Noemi Chapel, MD 04/03/19 2141

## 2019-04-03 ENCOUNTER — Emergency Department (HOSPITAL_COMMUNITY): Payer: Medicare Other

## 2019-04-03 DIAGNOSIS — G2581 Restless legs syndrome: Secondary | ICD-10-CM | POA: Diagnosis present

## 2019-04-03 DIAGNOSIS — M2550 Pain in unspecified joint: Secondary | ICD-10-CM | POA: Diagnosis not present

## 2019-04-03 DIAGNOSIS — B961 Klebsiella pneumoniae [K. pneumoniae] as the cause of diseases classified elsewhere: Secondary | ICD-10-CM | POA: Diagnosis present

## 2019-04-03 DIAGNOSIS — L89153 Pressure ulcer of sacral region, stage 3: Secondary | ICD-10-CM | POA: Diagnosis present

## 2019-04-03 DIAGNOSIS — M4628 Osteomyelitis of vertebra, sacral and sacrococcygeal region: Secondary | ICD-10-CM | POA: Diagnosis not present

## 2019-04-03 DIAGNOSIS — Z91030 Bee allergy status: Secondary | ICD-10-CM | POA: Diagnosis not present

## 2019-04-03 DIAGNOSIS — Z7901 Long term (current) use of anticoagulants: Secondary | ICD-10-CM | POA: Diagnosis not present

## 2019-04-03 DIAGNOSIS — R109 Unspecified abdominal pain: Secondary | ICD-10-CM | POA: Diagnosis not present

## 2019-04-03 DIAGNOSIS — Z7401 Bed confinement status: Secondary | ICD-10-CM | POA: Diagnosis not present

## 2019-04-03 DIAGNOSIS — R5381 Other malaise: Secondary | ICD-10-CM | POA: Diagnosis not present

## 2019-04-03 DIAGNOSIS — E876 Hypokalemia: Secondary | ICD-10-CM | POA: Diagnosis present

## 2019-04-03 DIAGNOSIS — E1169 Type 2 diabetes mellitus with other specified complication: Secondary | ICD-10-CM | POA: Diagnosis not present

## 2019-04-03 DIAGNOSIS — Z993 Dependence on wheelchair: Secondary | ICD-10-CM | POA: Diagnosis not present

## 2019-04-03 DIAGNOSIS — L89159 Pressure ulcer of sacral region, unspecified stage: Secondary | ICD-10-CM | POA: Diagnosis not present

## 2019-04-03 DIAGNOSIS — L02212 Cutaneous abscess of back [any part, except buttock]: Secondary | ICD-10-CM | POA: Diagnosis not present

## 2019-04-03 DIAGNOSIS — A419 Sepsis, unspecified organism: Secondary | ICD-10-CM | POA: Diagnosis not present

## 2019-04-03 DIAGNOSIS — Z885 Allergy status to narcotic agent status: Secondary | ICD-10-CM | POA: Diagnosis not present

## 2019-04-03 DIAGNOSIS — Z95828 Presence of other vascular implants and grafts: Secondary | ICD-10-CM | POA: Diagnosis not present

## 2019-04-03 DIAGNOSIS — L8990 Pressure ulcer of unspecified site, unspecified stage: Secondary | ICD-10-CM | POA: Insufficient documentation

## 2019-04-03 DIAGNOSIS — R6521 Severe sepsis with septic shock: Secondary | ICD-10-CM | POA: Diagnosis present

## 2019-04-03 DIAGNOSIS — N390 Urinary tract infection, site not specified: Secondary | ICD-10-CM | POA: Diagnosis present

## 2019-04-03 DIAGNOSIS — I509 Heart failure, unspecified: Secondary | ICD-10-CM | POA: Diagnosis not present

## 2019-04-03 DIAGNOSIS — L0291 Cutaneous abscess, unspecified: Secondary | ICD-10-CM | POA: Diagnosis not present

## 2019-04-03 DIAGNOSIS — F411 Generalized anxiety disorder: Secondary | ICD-10-CM | POA: Diagnosis present

## 2019-04-03 DIAGNOSIS — Z20822 Contact with and (suspected) exposure to covid-19: Secondary | ICD-10-CM | POA: Diagnosis present

## 2019-04-03 DIAGNOSIS — I110 Hypertensive heart disease with heart failure: Secondary | ICD-10-CM | POA: Diagnosis present

## 2019-04-03 DIAGNOSIS — E119 Type 2 diabetes mellitus without complications: Secondary | ICD-10-CM | POA: Diagnosis not present

## 2019-04-03 DIAGNOSIS — B965 Pseudomonas (aeruginosa) (mallei) (pseudomallei) as the cause of diseases classified elsewhere: Secondary | ICD-10-CM | POA: Diagnosis present

## 2019-04-03 DIAGNOSIS — M797 Fibromyalgia: Secondary | ICD-10-CM | POA: Diagnosis present

## 2019-04-03 DIAGNOSIS — R32 Unspecified urinary incontinence: Secondary | ICD-10-CM | POA: Diagnosis present

## 2019-04-03 DIAGNOSIS — I5032 Chronic diastolic (congestive) heart failure: Secondary | ICD-10-CM | POA: Diagnosis not present

## 2019-04-03 DIAGNOSIS — G4733 Obstructive sleep apnea (adult) (pediatric): Secondary | ICD-10-CM | POA: Diagnosis not present

## 2019-04-03 DIAGNOSIS — R0902 Hypoxemia: Secondary | ICD-10-CM | POA: Diagnosis not present

## 2019-04-03 DIAGNOSIS — G9341 Metabolic encephalopathy: Secondary | ICD-10-CM | POA: Diagnosis present

## 2019-04-03 DIAGNOSIS — I959 Hypotension, unspecified: Secondary | ICD-10-CM | POA: Diagnosis not present

## 2019-04-03 DIAGNOSIS — Z452 Encounter for adjustment and management of vascular access device: Secondary | ICD-10-CM | POA: Diagnosis not present

## 2019-04-03 DIAGNOSIS — L89150 Pressure ulcer of sacral region, unstageable: Secondary | ICD-10-CM | POA: Diagnosis not present

## 2019-04-03 DIAGNOSIS — Z888 Allergy status to other drugs, medicaments and biological substances status: Secondary | ICD-10-CM | POA: Diagnosis not present

## 2019-04-03 DIAGNOSIS — I4821 Permanent atrial fibrillation: Secondary | ICD-10-CM | POA: Diagnosis present

## 2019-04-03 DIAGNOSIS — R197 Diarrhea, unspecified: Secondary | ICD-10-CM | POA: Diagnosis not present

## 2019-04-03 DIAGNOSIS — I4891 Unspecified atrial fibrillation: Secondary | ICD-10-CM | POA: Diagnosis not present

## 2019-04-03 DIAGNOSIS — Z881 Allergy status to other antibiotic agents status: Secondary | ICD-10-CM | POA: Diagnosis not present

## 2019-04-03 DIAGNOSIS — R413 Other amnesia: Secondary | ICD-10-CM | POA: Diagnosis not present

## 2019-04-03 DIAGNOSIS — F329 Major depressive disorder, single episode, unspecified: Secondary | ICD-10-CM | POA: Diagnosis present

## 2019-04-03 LAB — URINALYSIS, ROUTINE W REFLEX MICROSCOPIC
Bilirubin Urine: NEGATIVE
Glucose, UA: NEGATIVE mg/dL
Ketones, ur: NEGATIVE mg/dL
Nitrite: NEGATIVE
Protein, ur: NEGATIVE mg/dL
Specific Gravity, Urine: 1.014 (ref 1.005–1.030)
WBC, UA: >50 WBC/hpf — ABNORMAL HIGH (ref 0–5)
pH: 5 (ref 5.0–8.0)

## 2019-04-03 LAB — BASIC METABOLIC PANEL
Anion gap: 12 (ref 5–15)
BUN: 14 mg/dL (ref 8–23)
CO2: 23 mmol/L (ref 22–32)
Calcium: 8.3 mg/dL — ABNORMAL LOW (ref 8.9–10.3)
Chloride: 103 mmol/L (ref 98–111)
Creatinine, Ser: 0.92 mg/dL (ref 0.44–1.00)
GFR calc Af Amer: >60 mL/min (ref >60)
GFR calc non Af Amer: >60 mL/min (ref >60)
Glucose, Bld: 208 mg/dL — ABNORMAL HIGH (ref 70–99)
Potassium: 3.1 mmol/L — ABNORMAL LOW (ref 3.5–5.1)
Sodium: 138 mmol/L (ref 135–145)

## 2019-04-03 LAB — HEMOGLOBIN A1C
Hgb A1c MFr Bld: 6 % — ABNORMAL HIGH (ref 4.8–5.6)
Mean Plasma Glucose: 125.5 mg/dL

## 2019-04-03 LAB — CBC
HCT: 39.3 % (ref 36.0–46.0)
Hemoglobin: 11.5 g/dL — ABNORMAL LOW (ref 12.0–15.0)
MCH: 23.2 pg — ABNORMAL LOW (ref 26.0–34.0)
MCHC: 29.3 g/dL — ABNORMAL LOW (ref 30.0–36.0)
MCV: 79.4 fL — ABNORMAL LOW (ref 80.0–100.0)
Platelets: 264 10*3/uL (ref 150–400)
RBC: 4.95 MIL/uL (ref 3.87–5.11)
RDW: 20 % — ABNORMAL HIGH (ref 11.5–15.5)
WBC: 17 10*3/uL — ABNORMAL HIGH (ref 4.0–10.5)
nRBC: 0 % (ref 0.0–0.2)

## 2019-04-03 LAB — LACTIC ACID, PLASMA: Lactic Acid, Venous: 1.9 mmol/L (ref 0.5–1.9)

## 2019-04-03 LAB — GLUCOSE, CAPILLARY
Glucose-Capillary: 151 mg/dL — ABNORMAL HIGH (ref 70–99)
Glucose-Capillary: 163 mg/dL — ABNORMAL HIGH (ref 70–99)
Glucose-Capillary: 158 mg/dL — ABNORMAL HIGH (ref 70–99)
Glucose-Capillary: 188 mg/dL — ABNORMAL HIGH (ref 70–99)

## 2019-04-03 LAB — MAGNESIUM: Magnesium: 1.6 mg/dL — ABNORMAL LOW (ref 1.7–2.4)

## 2019-04-03 LAB — PROTIME-INR
INR: 2.2 — ABNORMAL HIGH (ref 0.8–1.2)
Prothrombin Time: 24.1 seconds — ABNORMAL HIGH (ref 11.4–15.2)

## 2019-04-03 LAB — RESPIRATORY PANEL BY RT PCR (FLU A&B, COVID)
Influenza A by PCR: NEGATIVE
Influenza B by PCR: NEGATIVE
SARS Coronavirus 2 by RT PCR: NEGATIVE

## 2019-04-03 LAB — MRSA PCR SCREENING: MRSA by PCR: NEGATIVE

## 2019-04-03 MED ORDER — ALPRAZOLAM 0.25 MG PO TABS
0.2500 mg | ORAL_TABLET | Freq: Three times a day (TID) | ORAL | Status: DC
  Administered 2019-04-03 – 2019-04-05 (×7): 0.25 mg via ORAL
  Filled 2019-04-03 (×7): qty 1

## 2019-04-03 MED ORDER — GABAPENTIN 300 MG PO CAPS
300.0000 mg | ORAL_CAPSULE | Freq: Three times a day (TID) | ORAL | Status: DC
  Administered 2019-04-03 – 2019-04-07 (×13): 300 mg via ORAL
  Filled 2019-04-03 (×13): qty 1

## 2019-04-03 MED ORDER — SERTRALINE HCL 100 MG PO TABS
100.0000 mg | ORAL_TABLET | Freq: Every day | ORAL | Status: DC
  Administered 2019-04-03 – 2019-04-06 (×4): 100 mg via ORAL
  Filled 2019-04-03 (×4): qty 1

## 2019-04-03 MED ORDER — CHLORHEXIDINE GLUCONATE CLOTH 2 % EX PADS
6.0000 | MEDICATED_PAD | Freq: Every day | CUTANEOUS | Status: DC
  Administered 2019-04-03 – 2019-04-07 (×6): 6 via TOPICAL

## 2019-04-03 MED ORDER — EZETIMIBE 10 MG PO TABS
10.0000 mg | ORAL_TABLET | Freq: Every day | ORAL | Status: DC
  Administered 2019-04-03 – 2019-04-06 (×4): 10 mg via ORAL
  Filled 2019-04-03 (×4): qty 1

## 2019-04-03 MED ORDER — METOPROLOL SUCCINATE ER 25 MG PO TB24: 37.5000 mg | ORAL_TABLET | Freq: Every day | ORAL | Status: DC

## 2019-04-03 MED ORDER — MAGNESIUM SULFATE 2 GM/50ML IV SOLN
2.0000 g | Freq: Once | INTRAVENOUS | Status: AC
  Administered 2019-04-03: 2 g via INTRAVENOUS
  Filled 2019-04-03: qty 50

## 2019-04-03 MED ORDER — POTASSIUM CHLORIDE CRYS ER 20 MEQ PO TBCR: 40.0000 meq | EXTENDED_RELEASE_TABLET | Freq: Every day | ORAL | Status: DC

## 2019-04-03 MED ORDER — LISINOPRIL 10 MG PO TABS: 10.0000 mg | ORAL_TABLET | Freq: Every day | ORAL | Status: DC

## 2019-04-03 MED ORDER — SODIUM CHLORIDE 0.9 % IV BOLUS
1000.0000 mL | Freq: Once | INTRAVENOUS | Status: AC
  Administered 2019-04-03: 02:00:00 1000 mL via INTRAVENOUS

## 2019-04-03 MED ORDER — SODIUM CHLORIDE 0.9 % IV SOLN: 2.0000 g | Freq: Three times a day (TID) | INTRAVENOUS | Status: DC

## 2019-04-03 MED ORDER — IOHEXOL 300 MG/ML  SOLN
125.0000 mL | Freq: Once | INTRAMUSCULAR | Status: AC | PRN
  Administered 2019-04-03: 01:00:00 125 mL via INTRAVENOUS

## 2019-04-03 MED ORDER — ARIPIPRAZOLE 10 MG PO TABS
10.0000 mg | ORAL_TABLET | Freq: Every day | ORAL | Status: DC
  Administered 2019-04-03 – 2019-04-07 (×5): 10 mg via ORAL
  Filled 2019-04-03 (×5): qty 1

## 2019-04-03 MED ORDER — MORPHINE SULFATE (PF) 2 MG/ML IV SOLN: 2.0000 mg | Freq: Once | INTRAVENOUS | Status: DC | PRN

## 2019-04-03 MED ORDER — PIPERACILLIN-TAZOBACTAM 3.375 G IVPB
3.3750 g | Freq: Three times a day (TID) | INTRAVENOUS | Status: DC
  Administered 2019-04-03 – 2019-04-07 (×14): 3.375 g via INTRAVENOUS
  Filled 2019-04-03 (×14): qty 50

## 2019-04-03 MED ORDER — NOREPINEPHRINE 4 MG/250ML-% IV SOLN
0.0000 ug/min | INTRAVENOUS | Status: DC
  Administered 2019-04-03: 4 ug/min via INTRAVENOUS
  Administered 2019-04-03 (×2): 2 ug/min via INTRAVENOUS
  Filled 2019-04-03 (×2): qty 250

## 2019-04-03 MED ORDER — PRAMIPEXOLE DIHYDROCHLORIDE 0.125 MG PO TABS
0.1250 mg | ORAL_TABLET | Freq: Every day | ORAL | Status: DC
  Administered 2019-04-03 – 2019-04-06 (×4): 0.125 mg via ORAL
  Filled 2019-04-03 (×6): qty 1

## 2019-04-03 MED ORDER — WARFARIN SODIUM 7.5 MG PO TABS
7.5000 mg | ORAL_TABLET | Freq: Once | ORAL | Status: AC
  Administered 2019-04-03: 7.5 mg via ORAL
  Filled 2019-04-03: qty 1

## 2019-04-03 MED ORDER — CHLORHEXIDINE GLUCONATE 0.12 % MT SOLN
15.0000 mL | Freq: Two times a day (BID) | OROMUCOSAL | Status: DC
  Administered 2019-04-03 – 2019-04-07 (×9): 15 mL via OROMUCOSAL
  Filled 2019-04-03 (×8): qty 15

## 2019-04-03 MED ORDER — METOPROLOL TARTRATE 12.5 MG HALF TABLET
12.5000 mg | ORAL_TABLET | Freq: Two times a day (BID) | ORAL | Status: DC
  Administered 2019-04-03 – 2019-04-06 (×5): 12.5 mg via ORAL
  Filled 2019-04-03 (×5): qty 1

## 2019-04-03 MED ORDER — BASAGLAR KWIKPEN 100 UNIT/ML ~~LOC~~ SOPN: 40.0000 [IU] | PEN_INJECTOR | Freq: Every day | SUBCUTANEOUS | Status: DC

## 2019-04-03 MED ORDER — VANCOMYCIN HCL IN DEXTROSE 1-5 GM/200ML-% IV SOLN
1000.0000 mg | Freq: Two times a day (BID) | INTRAVENOUS | Status: DC
  Administered 2019-04-03 – 2019-04-05 (×6): 1000 mg via INTRAVENOUS
  Filled 2019-04-03 (×8): qty 200

## 2019-04-03 MED ORDER — ORAL CARE MOUTH RINSE
15.0000 mL | Freq: Two times a day (BID) | OROMUCOSAL | Status: DC
  Administered 2019-04-03 – 2019-04-06 (×5): 15 mL via OROMUCOSAL

## 2019-04-03 MED ORDER — ORAL CARE MOUTH RINSE
15.0000 mL | Freq: Two times a day (BID) | OROMUCOSAL | Status: DC
  Administered 2019-04-03 – 2019-04-06 (×5): 15 mL via OROMUCOSAL

## 2019-04-03 MED ORDER — BUSPIRONE HCL 5 MG PO TABS
10.0000 mg | ORAL_TABLET | Freq: Two times a day (BID) | ORAL | Status: DC
  Administered 2019-04-03 – 2019-04-07 (×9): 10 mg via ORAL
  Filled 2019-04-03: qty 2
  Filled 2019-04-03 (×3): qty 1
  Filled 2019-04-03: qty 2
  Filled 2019-04-03 (×4): qty 1

## 2019-04-03 MED ORDER — INSULIN ASPART 100 UNIT/ML ~~LOC~~ SOLN
0.0000 [IU] | Freq: Three times a day (TID) | SUBCUTANEOUS | Status: DC
  Administered 2019-04-03 – 2019-04-05 (×7): 3 [IU] via SUBCUTANEOUS
  Administered 2019-04-05: 2 [IU] via SUBCUTANEOUS
  Administered 2019-04-05 – 2019-04-06 (×3): 3 [IU] via SUBCUTANEOUS
  Administered 2019-04-07: 2 [IU] via SUBCUTANEOUS
  Administered 2019-04-07: 3 [IU] via SUBCUTANEOUS

## 2019-04-03 MED ORDER — OXYCODONE HCL 5 MG PO TABS
5.0000 mg | ORAL_TABLET | Freq: Four times a day (QID) | ORAL | Status: DC | PRN
  Administered 2019-04-03 – 2019-04-07 (×7): 5 mg via ORAL
  Filled 2019-04-03 (×7): qty 1

## 2019-04-03 MED ORDER — SPIRONOLACTONE 12.5 MG HALF TABLET: 12.5000 mg | ORAL_TABLET | Freq: Every day | ORAL | Status: DC

## 2019-04-03 MED ORDER — HEPARIN SODIUM (PORCINE) 5000 UNIT/ML IJ SOLN: 5000.0000 [IU] | Freq: Three times a day (TID) | INTRAMUSCULAR | Status: DC

## 2019-04-03 MED ORDER — INSULIN ASPART 100 UNIT/ML ~~LOC~~ SOLN: 0.0000 [IU] | Freq: Every day | SUBCUTANEOUS | Status: DC

## 2019-04-03 MED ORDER — POTASSIUM CHLORIDE 10 MEQ/100ML IV SOLN
10.0000 meq | INTRAVENOUS | Status: AC
  Administered 2019-04-03 (×4): 10 meq via INTRAVENOUS
  Filled 2019-04-03 (×4): qty 100

## 2019-04-03 MED ORDER — INSULIN GLARGINE 100 UNIT/ML ~~LOC~~ SOLN
25.0000 [IU] | Freq: Every day | SUBCUTANEOUS | Status: DC
  Administered 2019-04-03 – 2019-04-06 (×4): 25 [IU] via SUBCUTANEOUS
  Filled 2019-04-03 (×7): qty 0.25

## 2019-04-03 MED ORDER — PANTOPRAZOLE SODIUM 40 MG PO TBEC
40.0000 mg | DELAYED_RELEASE_TABLET | Freq: Every day | ORAL | Status: DC
  Administered 2019-04-03 – 2019-04-07 (×5): 40 mg via ORAL
  Filled 2019-04-03 (×5): qty 1

## 2019-04-03 MED ORDER — WARFARIN - PHARMACIST DOSING INPATIENT: Freq: Every day | Status: DC

## 2019-04-03 NOTE — Progress Notes (Signed)
Pt HR 140's-150's. Pressure stable. MD made aware. MD advised he will place orders. Will continue to monitor.

## 2019-04-03 NOTE — Progress Notes (Signed)
NAME:  Donna [name], MRN:  [id number], DOB:  05-16-1950, LOS: 0 ADMISSION DATE:  04/02/2019, CONSULTATION DATE: 04/03/2019 REFERRING MD: Emergency department, CHIEF COMPLAINT: Septic shock  Brief History   [name] with a history of Afib on warfarin, chronic sacral decubitus ulcer, OSA, diastolic heart failure, diabetes, restless leg syndrome, depression who presented initially as a code stroke for generalized "shaking," found to be in septic shock with 2 potential sources being sacral decub ulcer/possible abscess, and UTI.    History of present illness   Evidently, patient started trembling this evening around 730pm and was still able to talk to her husband.  Was not found to have a stroke on evaluation by neurology.  However was found to have low blood pressures and fever, concerning for septic shock.  She was given 3.5 L of fluid, however with persistently low blood pressures was started on Levophed.   On examination patient was alert and oriented.  She denied any abdominal pain, but did say that her sacral wound was painful.  She denies any increased pain or increased drainage over the last several weeks, however she does have persistent drainage from the site.  She currently is living with her husband with home health, has been wheelchair-bound up until a few weeks ago she is occasionally taking steps with a walker.  She denies any dysuria, but does have a history of UTIs.  Past Medical History   Past Medical History:  Diagnosis Date  . CHF (congestive heart failure) ([place])   . Chronic /permanent atrial fibrillation ([place]) 2005   On Warfarin (follwed @ [facility]); Rate controlled - On BB  . Chronic diastolic heart failure, NYHA class 2 ([place])    Updated by A. fib, hypertensive heart disease and obesity; EDP was only 7 by cardiac catheterization in September 2017  . Diabetic lumbosacral plexopathy ([place])   . Fibromyalgia   . Generalized anxiety disorder   . Hypokalemia  09/03/2017  . Incontinence   . Major depressive disorder   . OSA on CPAP   . Restless leg syndrome   . Syncope 09/03/2017  . Type II diabetes mellitus ([place])      Significant Hospital Events     Consults:  Surgery Neurology  Procedures:     Significant Diagnostic Tests:  CT abdomen pelvis with contrast 1/31. IMPRESSION: 1. 3.4 x 1.2 cm rim enhancing fluid collection superficial to the sacrum is suspicious for abscess. Punctate foci is of adjacent gas could be seen with decubitus ulceration but more aggressive soft tissue infection should be excluded on a clinical basis. 2. Indeterminate presacral lesion, increasing in size since 2019. Could be related to the adjacent fracture of the sacrococcygeal junction though overall remains indeterminate. 3. Bladder wall thickening with some urothelial thickening particular on the right concerning for potential ascending urinary tract infection. No visualized urolithiasis though distal evaluation is limited by streak from right hip arthroplasty. 4. Circumferential body wall edema. Correlate for features of anasarca. 5.  Aortic Atherosclerosis (ICD10-I70.0).  Micro Data:  Blood cultures 1/30 Urine culture 1/30 Covid PCR 1/30, negative  Antimicrobials:  Vancomycin 1/30 Aztreonam 1/30, stopped Metronidazole 1/30, stopped Zosyn 1/31  Interim history/subjective:  Patient is awake, able to interact She is having some right buttock discomfort  Objective   Blood pressure (!) 107/56, pulse 83, temperature 98.9 F (37.2 C), temperature source Oral, resp. rate 17, height 5\' 11"  (1.803 m), weight 114.6 kg, SpO2 99 %.  Intake/Output Summary (Last 24 hours) at 04/03/2019 0850 Last data filed at 04/03/2019 0700 Gross per 24 hour  Intake 3738.83 ml  Output --  Net 3738.83 ml   Filed Weights   04/02/19 2100 04/03/19 0500  Weight: 114.4 kg 114.6 kg    Examination: General: Laying in bed on her left side, no distress,  comfortable HENT: Oropharynx clear, pupils equal Lungs: Clear bilaterally, no crackles, no wheezes Cardiovascular: Atrial fibrillation, good rate control, no murmur Abdomen: Obese, soft, nondistended, positive bowel sounds Extremities: 1+ pretibial edema Neuro: Awake, alert, interacting appropriately, moves all extremities GU: Pure wick placed  Resolved Hospital Problem list     Assessment & Plan:  69 year old female presenting with septic shock and possible sacral abscess in the setting of a chronic sacral wound and possible UTI.  #Septic shock, remains on norepinephrine 6 currently #Chronic sacral decubitus wound #Sacral fluid collection identified on CT #Possible UTI.  UA with large LE but nitrite negative -Wean norepinephrine as able -Continue gentle fluid administration -Appreciate surgery evaluation of sacral wound.  No purulence, no evidence to support need for debridement of the wound itself -Discussed CT abdomen/pelvis findings with Dr. Pascal Lux in radiology.  The rounded opacity adjacent to her sacral wound is actually chronic, somewhat homogeneously enhancing (not really ring-enhancing), is unchanged on serial films.  Etiology is not clear but likelihood of an abscess or a contributor to her septic shock is low.  Consider possibly a neural sheath tumor?  Can defer drainage, consider reimaging if septic shock does not respond to current therapy. -Continue vancomycin, tolerating the change to Zosyn (reported cephalosporin allergy but has tolerated penicillins in the past).  History of urine cultures positive for Providencia and E. Faecalis.  -Wound care consult pending -Home antihypertensive regimen on hold: Lisinopril, spironolactone, furosemide, metoprolol  Chronic issues: #Chronic A. Fib: On Coumadin at home, INR therapeutic at 2.2.  Currently rate controlled -Continue to hold warfarin, if INR trends below 2, will start heparin drip for possible procedure. -Continue to hold  metoprolol in setting of septic shock  #Depression/anxiety: Continue home Abilify, Zoloft, Buspar, and low-dose Xanax  #Restless leg syndrome: Continue pramipexole  #Diabetes: On Lantus 40 at home -Sliding-scale insulin -Lantus decreased to 25 units for now -Probably start a diet if no planned procedures    Best practice:  Diet: N.p.o. Pain/Anxiety/Delirium protocol (if indicated): N/A VAP protocol (if indicated): N/AA DVT prophylaxis: On therapeutic anticoagulation outpt, start heparin if INR goes below 2 GI prophylaxis: Pantoprazole Glucose control: SSI, lantus Mobility: When appropriate Code Status: Full Family Communication: reviewed plans with the patient 1/30 Disposition: ICU  Labs   CBC: Recent Labs  Lab 04/02/19 2100 04/02/19 2112 04/02/19 2142 04/03/19 0401  WBC 12.5*  --   --  17.0*  NEUTROABS 11.7*  --   --   --   HGB 11.9* 13.9 13.6 11.5*  HCT 40.4 41.0 40.0 39.3  MCV 79.5*  --   --  79.4*  PLT 207  --   --  [id number]    Basic Metabolic Panel: Recent Labs  Lab 04/02/19 2100 04/02/19 2112 04/02/19 2142 04/03/19 0401  NA 136 137 138 138  K 3.3* 3.3* 3.1* 3.1*  CL 98 97*  --  103  CO2 25  --   --  23  GLUCOSE 284* 276*  --  208*  BUN 17 17  --  14  CREATININE 1.14* 0.90  --  0.92  CALCIUM 9.1  --   --  8.3*  MG  --   --   --  1.6*   GFR: Estimated Creatinine Clearance: 81.6 mL/min (by C-G formula based on SCr of 0.92 mg/dL). Recent Labs  Lab 04/02/19 2100 04/02/19 2348 04/03/19 0401  WBC 12.5*  --  17.0*  LATICACIDVEN 2.4* 1.9  --     Liver Function Tests: Recent Labs  Lab 04/02/19 2100  AST 26  ALT 9  ALKPHOS 81  BILITOT 1.5*  PROT 7.4  ALBUMIN 3.7   No results for input(s): LIPASE, AMYLASE in the last 168 hours. Recent Labs  Lab 04/02/19 2135  AMMONIA 37*    ABG    Component Value Date/Time   HCO3 27.1 04/02/2019 2142   TCO2 28 04/02/2019 2142   O2SAT 94.0 04/02/2019 2142     Coagulation Profile: Recent Labs  Lab  04/02/19 2100 04/03/19 0401  INR 2.2* 2.2*    Cardiac Enzymes: No results for input(s): CKTOTAL, CKMB, CKMBINDEX, TROPONINI in the last 168 hours.  HbA1C: Hgb A1c MFr Bld  Date/Time Value Ref Range Status  04/03/2019 04:01 AM 6.0 (H) 4.8 - 5.6 % Final    Comment:    (NOTE) Pre diabetes:          5.7%-6.4% Diabetes:              >6.4% Glycemic control for   <7.0% adults with diabetes   11/13/2018 04:59 AM 7.5 (H) 4.8 - 5.6 % Final    Comment:    (NOTE) Pre diabetes:          5.7%-6.4% Diabetes:              >6.4% Glycemic control for   <7.0% adults with diabetes     CBG: Recent Labs  Lab 04/02/19 2103  GLUCAP 260*     Critical care time: 86 min     Baltazar Apo, MD, PhD 04/03/2019, 8:59 AM [facility] [phone] or if no answer [phone]

## 2019-04-03 NOTE — Progress Notes (Signed)
[facility] ADULT ICU REPLACEMENT PROTOCOL FOR AM LAB REPLACEMENT ONLY  The patient does apply for the [facility] Adult ICU Electrolyte Replacment Protocol based on the criteria listed below:   1. Is GFR >/= 40 ml/min? Yes.    Patient's GFR today is >60 2. Is urine output >/= 0.5 ml/kg/hr for the last 6 hours? Yes.   Patient's UOP is 0.7 ml/kg/hr 3. Is BUN < 60 mg/dL? Yes.    Patient's BUN today is 14  4. Abnormal electrolyte(s):k 3.1, mag 1.6 5. Ordered repletion with: protocol 6. If a panic level lab has been reported, has the CCM MD in charge been notified? No.   Physician:    Ronda Fairly A 04/03/2019 6:14 AM

## 2019-04-03 NOTE — Consult Note (Signed)
Donna [name] 04/29/1950  [id number].    Requesting MD: Dr. Francie Massing Chief Complaint: Initially presented as a code stroke Reason for Consult: Sacral wound  HPI: Donna [name] is a 69 y.o. female with a history of A. Fib on Coumadin (INR 2.2), OSA, dHF, DM2 (A1c 6.0) and chronic sacral wound that was admitted for urosepsis and we were asked to see for sacral wound.  Patient initially presented as a code stroke to [facility] ED on 1/30 but upon arrival was found to be hypoxic, hypotensive and febrile.  Patient was found to be in septic shock and was mated to the ICU with likely source of UTI.  Patient was also noted to have 3.4 x 1.2 cm rim enhancing fluid collection superficial to the sacrum is suspicious for abscess on CT scan.  She has been seen for this issue by our service for this back 09/28/17 - 09/30/17.  She notes that her husband and home health care for her sacral wound at home with twice daily WTD dressing changes.  She does not follow with any physicians for this as an outpatient to the best of her knowledge.  She reports that the area is more painful whenever she sits on it but denies any increase in pain or drainage over the last several weeks.  CCM note also states this. Patient is mainly wheelchair bound but does use a walker on occasion. Patient is currently on Vanc/Zosyn for antibiotic coverage.   ROS: Review of Systems  Constitutional: Positive for chills and fever.  Respiratory: Positive for cough and shortness of breath.   Cardiovascular: Negative for chest pain.  Gastrointestinal: Negative for abdominal pain, diarrhea, nausea and vomiting.  Genitourinary: Positive for frequency.       Incontinence  Skin:       Sacral wound  All other systems reviewed and are negative.   Family History  Problem Relation Age of Onset   Hypertension Mother    Heart disease Mother    Leukemia Son     Past Medical History:  Diagnosis Date   CHF (congestive heart failure) ([place])     Chronic /permanent atrial fibrillation ([place]) 2005   On Warfarin (follwed @ [facility]); Rate controlled - On BB   Chronic diastolic heart failure, NYHA class 2 (HCC)    Updated by A. fib, hypertensive heart disease and obesity; EDP was only 7 by cardiac catheterization in September 2017   Diabetic lumbosacral plexopathy (HCC)    Fibromyalgia    Generalized anxiety disorder    Hypokalemia 09/03/2017   Incontinence    Major depressive disorder    OSA on CPAP    Restless leg syndrome    Syncope 09/03/2017   Type II diabetes mellitus [facility])     Past Surgical History:  Procedure Laterality Date   CARDIAC CATHETERIZATION  11/09/2015   [facility]: Nonocclusive CAD. EF 55%. EDP 7 mmHg.   CHOLECYSTECTOMY OPEN     JOINT REPLACEMENT     KNEE ARTHROSCOPY Right    MASS EXCISION Left    sarcoma - shoulder   SHOULDER OPEN ROTATOR CUFF REPAIR Bilateral    TONSILLECTOMY     TOTAL HIP ARTHROPLASTY Right 2015   TOTAL HIP REVISION Right 2015   TOTAL KNEE ARTHROPLASTY Right    TRANSTHORACIC ECHOCARDIOGRAM  11/2013; 02/2016   a) [facility]: with Definity:  EF 60-65%. Mod LA dilation. Mild AoV sclerosis. Normal RHP. Indeterminate LV filling pressures. b) [facility]: Normal  cavity size. EF 55-60%.no RWMA, mild AoV sclerosis (R cusp), Mod MAC. Mild biAtrial dilation.   TRANSTHORACIC ECHOCARDIOGRAM  08/2017    Mild LVH with normal EF 55-60%.  Unable to fully assess wall motion, but appear to be normal.  Unable to determine diastolic function because of A. fib.  Mild to moderate MAC with mild mitral stenosis (mean gradient 9 mmHg).  Moderate LA dilation.  CVP estimated 3 mmHg.  No obvious evidence of pulmonary hypertension.   VAGINAL HYSTERECTOMY      Social History:  reports that she has never smoked. She has never used smokeless tobacco. She reports that she does not drink alcohol or use drugs.  Allergies:  Allergies  Allergen Reactions   Bee  Venom Hives    Takes benadryl   Other Other (See Comments)    Raw foods with seeds give her "boils".  Avoids raw strawberries, blueberries. Tolerates cooked fruits, tomato sauce, bread/grains with seeds. [facility] 10/17/13 Berries with seeds Allergy to glutamine-C-quercet-selen-brom per [facility] 09/25/17   Gluten Meal Other (See Comments)    Per MAR   Alteplase Rash   Cefepime Rash   Fentanyl Other (See Comments)    Hallucinations, syncope     Metformin And Related Diarrhea    Medications Prior to Admission  Medication Sig Dispense Refill   acetaminophen (TYLENOL) 500 MG tablet Take 1,000 mg by mouth 3 (three) times daily.      ALPRAZolam (XANAX) 0.5 MG tablet Take 0.25 mg by mouth 3 (three) times daily.     ARIPiprazole (ABILIFY) 10 MG tablet Take 10 mg by mouth daily.     busPIRone (BUSPAR) 10 MG tablet Take 10 mg by mouth 2 (two) times daily.      ezetimibe (ZETIA) 10 MG tablet Take 10 mg by mouth at bedtime.      ferrous sulfate 325 (65 FE) MG tablet Take 325 mg by mouth daily with breakfast.      furosemide (LASIX) 20 MG tablet Take 1 tablet (20 mg total) by mouth daily.     gabapentin (NEURONTIN) 300 MG capsule Take 1 capsule (300 mg total) by mouth 3 (three) times daily.     lisinopril (ZESTRIL) 10 MG tablet Take 10 mg by mouth daily.     magnesium oxide (MAG-OX) 400 MG tablet Take 1 tablet (400 mg total) by mouth 3 (three) times daily. ALSO TAKE A EXTRA 400 MG TABLET ON THE DAYS YOU TAKE ZAROXOLYN TABLET (Patient taking differently: Take 400 mg by mouth 3 (three) times daily. ) 300 tablet 0   metoprolol succinate (TOPROL-XL) 25 MG 24 hr tablet Take 1.5 tablets (37.5 mg total) by mouth daily.     omeprazole (PRILOSEC) 20 MG capsule Take 20 mg by mouth daily as needed (acid reflux/indigestion).      oxyCODONE (OXY IR/ROXICODONE) 5 MG immediate release tablet Take 5 mg by mouth 3 (three) times daily.     potassium chloride SA (K-DUR) 20 MEQ tablet Take 2 tablets (40 mEq  total) by mouth daily. TAKE AN EXTRA 20 MEQ ON THE DAYS YOU TAKE ZAROXLYN (Patient taking differently: Take 20 mEq by mouth daily. )  0   pramipexole (MIRAPEX) 0.125 MG tablet Take 0.125 mg by mouth at bedtime.      sertraline (ZOLOFT) 100 MG tablet Take 100 mg by mouth at bedtime.      spironolactone (ALDACTONE) 25 MG tablet Take 0.5 tablets (12.5 mg total) by mouth daily.     tiZANidine (ZANAFLEX) 4 MG tablet  Take 4 mg by mouth 3 (three) times daily.      warfarin (COUMADIN) 1 MG tablet Take 1 mg by mouth daily. Take one tablet (1 mg) with a 5 mg and 2 mg tablet (8 mg dose) on Saturdays at 4pm     warfarin (COUMADIN) 2 MG tablet Take 2 mg by mouth See admin instructions. Take one tablet (2 mg) with a 5 mg and 1 mg tablet (8 mg dose) on Saturdays at 4 pm     warfarin (COUMADIN) 5 MG tablet Take 5 mg by mouth See admin instructions. Take one tablet (5 mg) by mouth on Fridays at 4pm, take one tablet (5 mg) with a 1 mg and 2 mg tablet (8 mg dose) on Saturdays at 4pm     ALPRAZolam (XANAX) 0.25 MG tablet Take 1 tablet (0.25 mg total) by mouth 3 (three) times daily. (Patient not taking: Reported on 04/02/2019) 5 tablet 0   docusate sodium (COLACE) 100 MG capsule Take 100 mg by mouth daily as needed for mild constipation.     Dulaglutide (TRULICITY) 1.5 KG/8.1EH SOPN Inject 1.5 mg into the skin every Saturday.      insulin detemir (LEVEMIR) 100 UNIT/ML injection Inject 0.4 mLs (40 Units total) into the skin at bedtime. (Patient not taking: Reported on 01/28/2019) 10 mL 11   Insulin Glargine (BASAGLAR KWIKPEN) 100 UNIT/ML SOPN Inject 40 Units into the skin at bedtime.     insulin regular (NOVOLIN R) 100 units/mL injection Inject 0-12 Units into the skin 3 (three) times daily before meals. 0-200 0 units 201-250 6 units 251-300 8 units 301-350 10 units 351-400 12 units Greater than 400 call MD     lidocaine (LIDODERM) 5 % Place 1 patch onto the skin daily as needed (right hip pain). Remove &  Discard patch within 12 hours or as directed by MD      ondansetron (ZOFRAN) 4 MG tablet Take 1 tablet (4 mg total) by mouth every 6 (six) hours as needed for nausea. 20 tablet 0     Physical Exam: Blood pressure (!) 107/56, pulse 83, temperature 98.9 F (37.2 C), temperature source Oral, resp. rate 17, height _0  (1.803 m), weight 114.6 kg, SpO2 99 %. General: pleasant, obese white female who is laying in bed in NAD HEENT: head is normocephalic, atraumatic.  Sclera are noninjected.  PERRL.  Ears and nose without any masses or lesions.  Mouth is pink and moist Heart: Irregular rhythm with regular rate. Palpable radial and pedal pulses bilaterally Lungs: Distant breath sounds throughout with rales, rhonchi or wheezing. On o2. Mild tachypnea.   Abd: soft, NT, ND, +BS, with hernias, or organomegaly GU: Chaperone present. Incontinent, bedding saturated. Please see picture below. There is a 3.5 x 1.5 x 0.5 cm sacral wound with healthy granulation tissue at the base. Wound probed with sterile cotton swab. There is no drainage. There is a small area of tracking of 0.5cm toward the right buttock at the 3 o'clock position (in reference to picture below). There is no fluctuance. The surrounding tissue is without induration, heat or signs of cellulitis. Area of redness appears 2/2 pressure, not infection.  MS: all 4 extremities are symmetrical with no cyanosis, clubbing, or edema. Skin: warm and dry with no masses, lesions, or rashes other than mentioned above Neuro: Awake and alert. Speech is normal. Moves all extremities. Psych: A&Ox3 with an appropriate affect.     Results for orders placed or performed during the hospital encounter  of 04/02/19 (from the past 48 hour(s))  Protime-INR     Status: Abnormal   Collection Time: 04/02/19  9:00 PM  Result Value Ref Range   Prothrombin Time 24.4 (H) 11.4 - 15.2 seconds   INR 2.2 (H) 0.8 - 1.2    Comment: (NOTE) INR goal varies based on device and  disease states. Performed at [facility], [place] [address] [place], [place] [address]   APTT     Status: Abnormal   Collection Time: 04/02/19  9:00 PM  Result Value Ref Range   aPTT 38 (H) 24 - 36 seconds    Comment:        IF BASELINE aPTT IS ELEVATED, SUGGEST PATIENT RISK ASSESSMENT BE USED TO DETERMINE APPROPRIATE ANTICOAGULANT THERAPY. Performed at [facility], [place] [address] [place], [place] [address]   CBC     Status: Abnormal   Collection Time: 04/02/19  9:00 PM  Result Value Ref Range   WBC 12.5 (H) 4.0 - 10.5 K/uL   RBC 5.08 3.87 - 5.11 MIL/uL   Hemoglobin 11.9 (L) 12.0 - 15.0 g/dL   HCT 40.4 36.0 - 46.0 %   MCV 79.5 (L) 80.0 - 100.0 fL   MCH 23.4 (L) 26.0 - 34.0 pg   MCHC 29.5 (L) 30.0 - 36.0 g/dL   RDW 19.6 (H) 11.5 - 15.5 %   Platelets 207 150 - 400 K/uL   nRBC 0.0 0.0 - 0.2 %    Comment: Performed at [facility] [address], [place], [place] [address]  Differential     Status: Abnormal   Collection Time: 04/02/19  9:00 PM  Result Value Ref Range   Neutrophils Relative % 95 %   Neutro Abs 11.7 (H) 1.7 - 7.7 K/uL   Lymphocytes Relative 3 %   Lymphs Abs 0.4 (L) 0.7 - 4.0 K/uL   Monocytes Relative 2 %   Monocytes Absolute 0.3 0.1 - 1.0 K/uL   Eosinophils Relative 0 %   Eosinophils Absolute 0.0 0.0 - 0.5 K/uL   Basophils Relative 0 %   Basophils Absolute 0.0 0.0 - 0.1 K/uL   Immature Granulocytes 0 %   Abs Immature Granulocytes 0.04 0.00 - 0.07 K/uL    Comment: Performed at [facility] [address] [place], [place] [address]  Comprehensive metabolic panel     Status: Abnormal   Collection Time: 04/02/19  9:00 PM  Result Value Ref Range   Sodium 136 135 - 145 mmol/L   Potassium 3.3 (L) 3.5 - 5.1 mmol/L   Chloride 98 98 - 111 mmol/L   CO2 25 22 - 32 mmol/L   Glucose, Bld 284 (H) 70 - 99 mg/dL   BUN 17 8 - 23 mg/dL   Creatinine, Ser 1.14 (H) 0.44 - 1.00 mg/dL   Calcium 9.1 8.9 - 10.3 mg/dL   Total Protein 7.4 6.5 - 8.1  g/dL   Albumin 3.7 3.5 - 5.0 g/dL   AST 26 15 - 41 U/L   ALT 9 0 - 44 U/L   Alkaline Phosphatase 81 38 - 126 U/L   Total Bilirubin 1.5 (H) 0.3 - 1.2 mg/dL   GFR calc non Af Amer 49 (L) >60 mL/min   GFR calc Af Amer 57 (L) >60 mL/min   Anion gap 13 5 - 15    Comment: Performed at [facility] [address], [place], [place] [address]  Lactic acid, plasma  Status: Abnormal   Collection Time: 04/02/19  9:00 PM  Result Value Ref Range   Lactic Acid, Venous 2.4 (HH) 0.5 - 1.9 mmol/L    Comment: CRITICAL RESULT CALLED TO, READ BACK BY AND VERIFIED WITH: PEICKERT P,RN 04/02/19 [id number] Performed at [facility], [place] [address], [place], [place] [address]   CBG monitoring, ED     Status: Abnormal   Collection Time: 04/02/19  9:03 PM  Result Value Ref Range   Glucose-Capillary 260 (H) 70 - 99 mg/dL  I-stat chem 8, ED     Status: Abnormal   Collection Time: 04/02/19  9:12 PM  Result Value Ref Range   Sodium 137 135 - 145 mmol/L   Potassium 3.3 (L) 3.5 - 5.1 mmol/L   Chloride 97 (L) 98 - 111 mmol/L   BUN 17 8 - 23 mg/dL   Creatinine, Ser 0.90 0.44 - 1.00 mg/dL   Glucose, Bld 276 (H) 70 - 99 mg/dL   Calcium, Ion 1.08 (L) 1.15 - 1.40 mmol/L   TCO2 27 22 - 32 mmol/L   Hemoglobin 13.9 12.0 - 15.0 g/dL   HCT 41.0 36.0 - 46.0 %  Ammonia     Status: Abnormal   Collection Time: 04/02/19  9:35 PM  Result Value Ref Range   Ammonia 37 (H) 9 - 35 umol/L    Comment: Performed at [facility], [place] [address], [place],  [address]  POCT I-Stat EG7     Status: Abnormal   Collection Time: 04/02/19  9:42 PM  Result Value Ref Range   pH, Ven 7.463 (H) 7.250 - 7.430   pCO2, Ven 37.8 (L) 44.0 - 60.0 mmHg   pO2, Ven 66.0 (H) 32.0 - 45.0 mmHg   Bicarbonate 27.1 20.0 - 28.0 mmol/L   TCO2 28 22 - 32 mmol/L   O2 Saturation 94.0 %   Acid-Base Excess 3.0 (H) 0.0 - 2.0 mmol/L   Sodium 138 135 - 145 mmol/L   Potassium 3.1 (L) 3.5 - 5.1 mmol/L   Calcium, Ion 1.08 (L) 1.15 -  1.40 mmol/L   HCT 40.0 36.0 - 46.0 %   Hemoglobin 13.6 12.0 - 15.0 g/dL   Patient temperature HIDE    Sample type VENOUS   POC SARS Coronavirus 2 Ag-ED - Nasal Swab (BD Veritor Kit)     Status: None   Collection Time: 04/02/19 10:44 PM  Result Value Ref Range   SARS Coronavirus 2 Ag NEGATIVE NEGATIVE    Comment: (NOTE) SARS-CoV-2 antigen NOT DETECTED.  Negative results are presumptive.  Negative results do not preclude SARS-CoV-2 infection and should not be used as the sole basis for treatment or other patient management decisions, including infection  control decisions, particularly in the presence of clinical signs and  symptoms consistent with COVID-19, or in those who have been in contact with the virus.  Negative results must be combined with clinical observations, patient history, and epidemiological information. The expected result is Negative. Fact Sheet for Patients: PodPark.tn Fact Sheet for Healthcare Providers: GiftContent.is This test is not yet approved or cleared by the [place] FDA and  has been authorized for detection and/or diagnosis of SARS-CoV-2 by FDA under an Emergency Use Authorization (EUA).  This EUA will remain in effect (meaning this test can be used) for the duration of  the COVID-19 de claration under Section 564(b)(1) of the Act, 21 U.S.C. section 360bbb-3(b)(1), unless the authorization is terminated or revoked sooner.   Respiratory Panel  by RT PCR (Flu A&B, Covid) - Nasopharyngeal Swab     Status: None   Collection Time: 04/02/19 11:36 PM   Specimen: Nasopharyngeal Swab  Result Value Ref Range   SARS Coronavirus 2 by RT PCR NEGATIVE NEGATIVE    Comment: (NOTE) SARS-CoV-2 target nucleic acids are NOT DETECTED. The SARS-CoV-2 RNA is generally detectable in upper respiratoy specimens during the acute phase of infection. The lowest concentration of SARS-CoV-2 viral copies this assay can  detect is 131 copies/mL. A negative result does not preclude SARS-Cov-2 infection and should not be used as the sole basis for treatment or other patient management decisions. A negative result may occur with  improper specimen collection/handling, submission of specimen other than nasopharyngeal swab, presence of viral mutation(s) within the areas targeted by this assay, and inadequate number of viral copies (<131 copies/mL). A negative result must be combined with clinical observations, patient history, and epidemiological information. The expected result is Negative. Fact Sheet for Patients:  PinkCheek.be Fact Sheet for Healthcare Providers:  GravelBags.it This test is not yet ap proved or cleared by the [place] FDA and  has been authorized for detection and/or diagnosis of SARS-CoV-2 by FDA under an Emergency Use Authorization (EUA). This EUA will remain  in effect (meaning this test can be used) for the duration of the COVID-19 declaration under Section 564(b)(1) of the Act, 21 U.S.C. section 360bbb-3(b)(1), unless the authorization is terminated or revoked sooner.    Influenza A by PCR NEGATIVE NEGATIVE   Influenza B by PCR NEGATIVE NEGATIVE    Comment: (NOTE) The Xpert Xpress SARS-CoV-2/FLU/RSV assay is intended as an aid in  the diagnosis of influenza from Nasopharyngeal swab specimens and  should not be used as a sole basis for treatment. Nasal washings and  aspirates are unacceptable for Xpert Xpress SARS-CoV-2/FLU/RSV  testing. Fact Sheet for Patients: PinkCheek.be Fact Sheet for Healthcare Providers: GravelBags.it This test is not yet approved or cleared by the [place] FDA and  has been authorized for detection and/or diagnosis of SARS-CoV-2 by  FDA under an Emergency Use Authorization (EUA). This EUA will remain  in effect (meaning this test  can be used) for the duration of the  Covid-19 declaration under Section 564(b)(1) of the Act, 21  U.S.C. section 360bbb-3(b)(1), unless the authorization is  terminated or revoked. Performed at [facility], [place] [address] [place], [place] [address]   Urinalysis, Routine w reflex microscopic     Status: Abnormal   Collection Time: 04/02/19 11:43 PM  Result Value Ref Range   Color, Urine YELLOW YELLOW   APPearance CLOUDY (A) CLEAR   Specific Gravity, Urine 1.014 1.005 - 1.030   pH 5.0 5.0 - 8.0   Glucose, UA NEGATIVE NEGATIVE mg/dL   Hgb urine dipstick SMALL (A) NEGATIVE   Bilirubin Urine NEGATIVE NEGATIVE   Ketones, ur NEGATIVE NEGATIVE mg/dL   Protein, ur NEGATIVE NEGATIVE mg/dL   Nitrite NEGATIVE NEGATIVE   Leukocytes,Ua LARGE (A) NEGATIVE   RBC / HPF 0-5 0 - 5 RBC/hpf   WBC, UA >50 (H) 0 - 5 WBC/hpf   Bacteria, UA RARE (A) NONE SEEN   Squamous Epithelial / LPF 0-5 0 - 5   WBC Clumps PRESENT    Hyaline Casts, UA PRESENT     Comment: Performed at [facility], [place] [address], [place], [place] [address]  Lactic acid, plasma     Status: None   Collection Time: 04/02/19 11:48 PM  Result Value  Ref Range   Lactic Acid, Venous 1.9 0.5 - 1.9 mmol/L    Comment: Performed at [facility] [address] [place], [place] [address]  CBC     Status: Abnormal   Collection Time: 04/03/19  4:01 AM  Result Value Ref Range   WBC 17.0 (H) 4.0 - 10.5 K/uL   RBC 4.95 3.87 - 5.11 MIL/uL   Hemoglobin 11.5 (L) 12.0 - 15.0 g/dL   HCT 39.3 36.0 - 46.0 %   MCV 79.4 (L) 80.0 - 100.0 fL   MCH 23.2 (L) 26.0 - 34.0 pg   MCHC 29.3 (L) 30.0 - 36.0 g/dL   RDW 20.0 (H) 11.5 - 15.5 %   Platelets 264 150 - 400 K/uL   nRBC 0.0 0.0 - 0.2 %    Comment: Performed at [facility], [place] [address] [place], [place] [address]  Basic metabolic panel     Status: Abnormal   Collection Time: 04/03/19  4:01 AM  Result Value Ref Range   Sodium 138 135 - 145 mmol/L   Potassium 3.1 (L) 3.5  - 5.1 mmol/L   Chloride 103 98 - 111 mmol/L   CO2 23 22 - 32 mmol/L   Glucose, Bld 208 (H) 70 - 99 mg/dL   BUN 14 8 - 23 mg/dL   Creatinine, Ser 0.92 0.44 - 1.00 mg/dL   Calcium 8.3 (L) 8.9 - 10.3 mg/dL   GFR calc non Af Amer >60 >60 mL/min   GFR calc Af Amer >60 >60 mL/min   Anion gap 12 5 - 15    Comment: Performed at [facility], [place] [address] [place], [place] [address]  Protime-INR     Status: Abnormal   Collection Time: 04/03/19  4:01 AM  Result Value Ref Range   Prothrombin Time 24.1 (H) 11.4 - 15.2 seconds   INR 2.2 (H) 0.8 - 1.2    Comment: (NOTE) INR goal varies based on device and disease states. Performed at [facility], [place] [address], [place], [place] [address]   Hemoglobin A1c     Status: Abnormal   Collection Time: 04/03/19  4:01 AM  Result Value Ref Range   Hgb A1c MFr Bld 6.0 (H) 4.8 - 5.6 %    Comment: (NOTE) Pre diabetes:          5.7%-6.4% Diabetes:              >6.4% Glycemic control for   <7.0% adults with diabetes    Mean Plasma Glucose 125.5 mg/dL    Comment: Performed at [facility] [address] [place], [place] [address]  Magnesium     Status: Abnormal   Collection Time: 04/03/19  4:01 AM  Result Value Ref Range   Magnesium 1.6 (L) 1.7 - 2.4 mg/dL    Comment: Performed at [facility] [address] [place], [place] [address]   CT ABDOMEN PELVIS W CONTRAST  Result Date: 04/03/2019 CLINICAL DATA:  Abdominal pain, diarrhea, sepsis EXAM: CT ABDOMEN AND PELVIS WITH CONTRAST TECHNIQUE: Multidetector CT imaging of the abdomen and pelvis was performed using the standard protocol following bolus administration of intravenous contrast. CONTRAST:  171m OMNIPAQUE IOHEXOL 300 MG/ML  SOLN COMPARISON:  CT abdomen pelvis 01/28/2019, CT abdomen pelvis 09/03/2017 FINDINGS: Lower chest: Lung bases are clear. Borderline cardiomegaly. Dense mitral annular calcifications. Coronary artery calcifications are present. No pericardial  effusion. Hepatobiliary: No focal liver abnormality is seen. Patient is  post cholecystectomy. Slight prominence of the biliary tree likely related to reservoir effect. No calcified intraductal gallstones. Pancreas: Unremarkable. No pancreatic ductal dilatation or surrounding inflammatory changes. Spleen: Mild splenomegaly. No focal splenic lesions. Adrenals/Urinary Tract: Normal adrenal glands. There is bilateral perinephric stranding, nonspecific. Multiple bilateral fluid attenuation cysts are seen in both kidneys, largest is a bilobed cysts in the left upper pole measuring 2.4 cm. Bilateral cortical scarring is noted as well. There is mild right hydroureteronephrosis without visible obstructing calculus though the distal ureter and ureterovesicular junction is largely obscured by streak artifact from patient's right hip prosthesis. No left urinary tract dilatation or calcification. Question some mild faint perivesicular stranding. Stomach/Bowel: Distal esophagus, stomach and duodenal sweep are unremarkable. No small bowel wall thickening or dilatation. No evidence of obstruction. A normal appendix is visualized. Scattered colonic diverticula without focal pericolonic inflammation to suggest diverticulitis. Vascular/Lymphatic: Atherosclerotic plaque within the normal caliber aorta. No suspicious or enlarged lymph nodes in the included lymphatic chains. Reproductive: Uterus is surgically absent. No concerning adnexal lesions. Other: No abdominopelvic free fluid or air. Right abdominal wall laxity. Circumferential body wall edema. There is focal rim enhancing fluid collection superficial to the sacrum measuring 3.4 x 1.2 cm (3/68). Small punctate focus of gas is noted as well (7/81). Additionally, there is an indeterminate presacral structure, either fluid collection or intermediate attenuation mass measuring 50 Hounsfield units. This is not significantly changed in size or appearance from 26/37/8588 though certainly  reflect some interval increase in size measuring up to 4 cm on today's exam, previously 3 cm on study from September 03, 2017. Musculoskeletal: There is right abdominal wall laxity with notable atrophy of the right rectus sheath. Multilevel degenerative changes are present in the spine including bony fusion across many of the lower lumbar levels and severe interspinous arthrosis. Abrupt angulation of the sacrococcygeal junction is noted and is in close proximity to presacral collection, clear these are related findings. IMPRESSION: 1. 3.4 x 1.2 cm rim enhancing fluid collection superficial to the sacrum is suspicious for abscess. Punctate foci is of adjacent gas could be seen with decubitus ulceration but more aggressive soft tissue infection should be excluded on a clinical basis. 2. Indeterminate presacral lesion, increasing in size since 2019. Could be related to the adjacent fracture of the sacrococcygeal junction though overall remains indeterminate. 3. Bladder wall thickening with some urothelial thickening particular on the right concerning for potential ascending urinary tract infection. No visualized urolithiasis though distal evaluation is limited by streak from right hip arthroplasty. 4. Circumferential body wall edema. Correlate for features of anasarca. 5.  Aortic Atherosclerosis (ICD10-I70.0). And Critical Value/emergent results were called by telephone at the time of interpretation on 04/03/2019 at 1:45 am to provider [facility] , who verbally acknowledged these results. Electronically Signed   By: Lovena Le M.D.   On: 04/03/2019 01:45   DG Chest Port 1 View  Result Date: 04/02/2019 CLINICAL DATA:  Cough.  Hypoxia. EXAM: PORTABLE CHEST 1 VIEW COMPARISON:  01/28/2019 FINDINGS: The heart size is enlarged. Aortic calcifications are noted. There is no pneumothorax. No large pleural effusion. No focal infiltrate. Degenerative changes are noted of both glenohumeral joints and both AC joints. There is no  obvious acute osseous abnormality. IMPRESSION: No active disease. Electronically Signed   By: Constance Holster M.D.   On: 04/02/2019 22:37   CT HEAD CODE STROKE WO CONTRAST  Result Date: 04/02/2019 CLINICAL DATA:  Code stroke. 69 year old female with abnormal speech. EXAM: CT HEAD WITHOUT  CONTRAST TECHNIQUE: Contiguous axial images were obtained from the base of the skull through the vertex without intravenous contrast. COMPARISON:  Brain MRI 06/18/2011. Head CT 09/24/2017. FINDINGS: Brain: Chronic partially empty sella. Stable cerebral volume. No midline shift, ventriculomegaly, mass effect, evidence of mass lesion, intracranial hemorrhage or evidence of cortically based acute infarction. Mild for age scattered white matter hypodensity appears stable. No cortical encephalomalacia identified. Vascular: Calcified atherosclerosis at the skull base. No suspicious intracranial vascular hyperdensity. Skull: No acute osseous abnormality identified. Sinuses/Orbits: Visualized paranasal sinuses and mastoids are stable and well pneumatized. Other: No acute orbit or scalp soft tissue finding. ASPECTS [facility] Stroke Program Early CT Score) Total score (0-10 with 10 being normal): 10 IMPRESSION: Stable since 2019 and largely negative for age noncontrast CT appearance of the brain. ASPECTS 10. These results were communicated to Dr. Lorraine Lax at 9:16 pm on 1/30/2021by text page via the [facility] messaging system. Electronically Signed   By: Genevie Ann M.D.   On: 04/02/2019 21:17    Anti-infectives (From admission, onward)   Start     Dose/Rate Route Frequency Ordered Stop   04/03/19 1000  vancomycin (VANCOCIN) IVPB 1000 mg/200 mL premix     1,000 mg 200 mL/hr over 60 Minutes Intravenous Every 12 hours 04/03/19 0037     04/03/19 0600  aztreonam (AZACTAM) 2 g in sodium chloride 0.9 % 100 mL IVPB  Status:  Discontinued     2 g 200 mL/hr over 30 Minutes Intravenous Every 8 hours 04/03/19 0037 04/03/19 0418   04/03/19 0600   piperacillin-tazobactam (ZOSYN) IVPB 3.375 g     3.375 g 12.5 mL/hr over 240 Minutes Intravenous Every 8 hours 04/03/19 0418     04/02/19 2145  metroNIDAZOLE (FLAGYL) IVPB 500 mg     500 mg 100 mL/hr over 60 Minutes Intravenous  Once 04/02/19 2142 04/02/19 2300   04/02/19 2145  vancomycin (VANCOCIN) IVPB 1000 mg/200 mL premix     1,000 mg 200 mL/hr over 60 Minutes Intravenous  Once 04/02/19 2142 04/02/19 2300   04/02/19 2130  aztreonam (AZACTAM) 2 g in sodium chloride 0.9 % 100 mL IVPB     2 g 200 mL/hr over 30 Minutes Intravenous  Once 04/02/19 2142 04/02/19 2300       Assessment/Plan A. Fib on Coumadin (INR 2.2) OSA dHF DM2 (A1c 6.0)  Sepsis likely 2/2 UTI based on UA and CT imaging  - Per CCM -  Sacral wound -Patient seen and examined by myself.  There is a 3.5 x 1.5 x 0.5 cm sacral wound with healthy granulation tissue at the base. Wound probed with sterile cotton swab. There is no drainage. There is a small area of tracking of 0.5cm toward the right buttock at the 3 o'clock position.  There is no fluctuance.  No evidence of abscess.  Area of redness around skin appears secondary to pressure and is without evidence of cellulitis.  I do not suspect that the patient's sepsis is secondary to her sacral wound.  It overall appears healthy.  There is a mention of possible abscess on CT scan.  I do not visualize this on exam. There is no indication for surgical debridement or drainage at this time. Recommend twice daily wet-to-dry dressing changes and frequent turning to decrease pressure over the area.  We will sign off.    Jillyn Ledger, [facility] Surgery 04/03/2019, 8:10 AM Please see Amion for pager number during day hours 7:00am-4:30pm

## 2019-04-03 NOTE — Progress Notes (Addendum)
Pharmacy Antibiotic Note  Donna [name] is a 69 y.o. female admitted on 04/02/2019 with sepsis.  Pharmacy has been consulted for Vancomycin/Aztreonam dosing. Initially to the ED as a code stroke. Found to be febrile, tachycardic, hypotensive. Mild WBC elevated. Lactic acid elevated. Renal function ok.   1/31 AM update: changing Aztreonam to Zosyn. Per CCM, pt denies any PCN allergy, just Cefepime.   Plan: Vancomycin 1000 mg IV q12h >>Estimated AUC: 477 Aztreonam 2g IV q8h>>>Change to Zosyn 3.375G IV q8h to be infused over 4 hours Trend WBC, temp, renal function  F/U infectious work-up Drug levels as indicated  Height: 5\' 11"  (180.3 cm) Weight: 252 lb 3.3 oz (114.4 kg) IBW/kg (Calculated) : 70.8  Temp (24hrs), Avg:102.2 F (39 C), Min:102.2 F (39 C), Max:102.2 F (39 C)  Recent Labs  Lab 04/02/19 2100 04/02/19 2112  WBC 12.5*  --   CREATININE 1.14* 0.90  LATICACIDVEN 2.4*  --     Estimated Creatinine Clearance: 83.3 mL/min (by C-G formula based on SCr of 0.9 mg/dL).    Allergies  Allergen Reactions  . Bee Venom Hives    Takes benadryl  . Other Other (See Comments)    Raw foods with seeds give her "boils".  Avoids raw strawberries, blueberries. Tolerates cooked fruits, tomato sauce, bread/grains with seeds. [facility] 10/17/13 Berries with seeds Allergy to glutamine-C-quercet-selen-brom per [facility] 09/25/17  . Gluten Meal Other (See Comments)    Per MAR  . Alteplase Rash  . Cefepime Rash  . Fentanyl Other (See Comments)    Hallucinations, syncope    . Metformin And Related Diarrhea    Narda Bonds, PharmD, BCPS Clinical Pharmacist Phone: [phone]

## 2019-04-03 NOTE — H&P (Signed)
NAME:  RACHELLE EDWARDS, MRN:  [id number], DOB:  Jan 25, 1951, LOS: 0 ADMISSION DATE:  04/02/2019, CONSULTATION DATE: 04/03/2019 REFERRING MD: Emergency department, CHIEF COMPLAINT: Septic shock  Brief History   [name] with a history of Afib on warfarin, chronic sacral decubitus ulcer, OSA, diastolic heart failure, diabetes, restless leg syndrome, depression who presented initially as a code stroke for generalized "shaking," found to be in septic shock with 2 potential sources being sacral decub ulcer/possible abscess, and UTI.    History of present illness   Evidently, patient started trembling this evening around 730pm and was still able to talk to her husband.  Was not found to have a stroke on evaluation by neurology.  However was found to have low blood pressures and fever, concerning for septic shock.  She was given 3.5 L of fluid, however with persistently low blood pressures was started on Levophed.   On examination patient was alert and oriented.  She denied any abdominal pain, but did say that her sacral wound was painful.  She denies any increased pain or increased drainage over the last several weeks, however she does have persistent drainage from the site.  She currently is living with her husband with home health, has been wheelchair-bound up until a few weeks ago she is occasionally taking steps with a walker.  She denies any dysuria, but does have a history of UTIs.  Past Medical History   Past Medical History:  Diagnosis Date  . CHF (congestive heart failure) ([place])   . Chronic /permanent atrial fibrillation ([place]) 2005   On Warfarin (follwed @ [facility]); Rate controlled - On BB  . Chronic diastolic heart failure, NYHA class 2 ([place])    Updated by A. fib, hypertensive heart disease and obesity; EDP was only 7 by cardiac catheterization in September 2017  . Diabetic lumbosacral plexopathy ([place])   . Fibromyalgia   . Generalized anxiety disorder   . Hypokalemia  09/03/2017  . Incontinence   . Major depressive disorder   . OSA on CPAP   . Restless leg syndrome   . Syncope 09/03/2017  . Type II diabetes mellitus ([place])      Significant Hospital Events     Consults:  Surgery Neurology  Procedures:     Significant Diagnostic Tests:  CT abdomen pelvis with contrast 1/31. IMPRESSION: 1. 3.4 x 1.2 cm rim enhancing fluid collection superficial to the sacrum is suspicious for abscess. Punctate foci is of adjacent gas could be seen with decubitus ulceration but more aggressive soft tissue infection should be excluded on a clinical basis. 2. Indeterminate presacral lesion, increasing in size since 2019. Could be related to the adjacent fracture of the sacrococcygeal junction though overall remains indeterminate. 3. Bladder wall thickening with some urothelial thickening particular on the right concerning for potential ascending urinary tract infection. No visualized urolithiasis though distal evaluation is limited by streak from right hip arthroplasty. 4. Circumferential body wall edema. Correlate for features of anasarca. 5.  Aortic Atherosclerosis (ICD10-I70.0).  Micro Data:  Blood cultures 1/30 Urine culture 1/30 Covid PCR 1/30, negative  Antimicrobials:  Vancomycin 1/30 Aztreonam 1/30, stopped Metronidazole 1/30, stopped Zosyn 1/31  Interim history/subjective:    Objective   Blood pressure (!) 113/44, pulse 78, temperature (!) 102.2 F (39 C), temperature source Rectal, resp. rate 19, height _0  (1.803 m), weight 114.4 kg, SpO2 99 %.        Intake/Output Summary (Last 24 hours) at 04/03/2019 0330 Last  data filed at 04/03/2019 0100 Gross per 24 hour  Intake 2600 ml  Output --  Net 2600 ml   Filed Weights   04/02/19 2100  Weight: 114.4 kg    Examination: General: Alert and oriented.  Looks comfortable.  Occasionally falling asleep during conversation HENT: No abnormalities, pupils equal reactive Lungs: Clear  breath sounds bilaterally, satting 99% on 2 L nasal cannula Cardiovascular: A. fib, heart rate mostly in the 80s. Abdomen: Soft nontender nondistended no suprapubic pain Extremities: 1+ pitting edema bilaterally Neuro: No focal neurologic deficits. GU: Pure wick in place  Resolved Hospital Problem list     Assessment & Plan:  69 year old female presenting with septic shock and possible sacral abscess in the setting of a chronic sacral wound and possible UTI.  #Septic shock #Sacral abscess #UTI S/p 3.5 L fluid resuscitation, currently on Levophed of 8 through peripheral.  May need central line if Levophed dose climbs.  Given vancomycin and aztreonam and metronidazole in the ED (documented cephalosporin allergy), however patient denies any allergy to penicillins.  I suspect that initial generalized shaking was due to rigors -Surgery to evaluate sacral abscess found on CT abdomen pelvis. -Vancomycin continue, start Zosyn.  D/C aztreonam and metronidazole -Blood cultures, urine cultures follow-up (history of urine cultures positive for Providencia, sensitive to Zosyn, and Enterococcus faecalis, sensitive to vancomycin). -Wound care consult -Hold home lisinopril, spironolactone, Lasix, and metoprolol   Chronic issues: #Chronic A. Fib: On Coumadin at home, INR therapeutic at 2.2.  Currently rate controlled -Hold warfarin, if INR trends below 2, will start heparin drip for possible procedure. -Hold metoprolol in setting of septic shock  #Depression/anxiety: Continue home Abilify, Zoloft, Buspar, and low-dose Xanax  #Restless leg syndrome: Continue pramipexole  #Diabetes: On Lantus 40 at home, will give Lantus 25 units here.  + Sliding scale insulin    Best practice:  Diet: N.p.o. Pain/Anxiety/Delirium protocol (if indicated): N/A VAP protocol (if indicated): N/AA DVT prophylaxis: On therapeutic anticoagulation, start heparin if INR goes below 2 GI prophylaxis: Pantoprazole Glucose  control: Basal bolus Mobility: When appropriate Code Status: Full Family Communication: Spoke with patient. Disposition: ICU  Labs   CBC: Recent Labs  Lab 04/02/19 2100 04/02/19 2112 04/02/19 2142  WBC 12.5*  --   --   NEUTROABS 11.7*  --   --   HGB 11.9* 13.9 13.6  HCT 40.4 41.0 40.0  MCV 79.5*  --   --   PLT 207  --   --     Basic Metabolic Panel: Recent Labs  Lab 04/02/19 2100 04/02/19 2112 04/02/19 2142  NA 136 137 138  K 3.3* 3.3* 3.1*  CL 98 97*  --   CO2 25  --   --   GLUCOSE 284* 276*  --   BUN 17 17  --   CREATININE 1.14* 0.90  --   CALCIUM 9.1  --   --    GFR: Estimated Creatinine Clearance: 83.3 mL/min (by C-G formula based on SCr of 0.9 mg/dL). Recent Labs  Lab 04/02/19 2100 04/02/19 2348  WBC 12.5*  --   LATICACIDVEN 2.4* 1.9    Liver Function Tests: Recent Labs  Lab 04/02/19 2100  AST 26  ALT 9  ALKPHOS 81  BILITOT 1.5*  PROT 7.4  ALBUMIN 3.7   No results for input(s): LIPASE, AMYLASE in the last 168 hours. Recent Labs  Lab 04/02/19 2135  AMMONIA 37*    ABG    Component Value Date/Time   HCO3 27.1  04/02/2019 2142   TCO2 28 04/02/2019 2142   O2SAT 94.0 04/02/2019 2142     Coagulation Profile: Recent Labs  Lab 04/02/19 2100  INR 2.2*    Cardiac Enzymes: No results for input(s): CKTOTAL, CKMB, CKMBINDEX, TROPONINI in the last 168 hours.  HbA1C: Hgb A1c MFr Bld  Date/Time Value Ref Range Status  11/13/2018 04:59 AM 7.5 (H) 4.8 - 5.6 % Final    Comment:    (NOTE) Pre diabetes:          5.7%-6.4% Diabetes:              >6.4% Glycemic control for   <7.0% adults with diabetes   09/03/2017 03:27 AM 7.9 (H) 4.8 - 5.6 % Final    Comment:    (NOTE) Pre diabetes:          5.7%-6.4% Diabetes:              >6.4% Glycemic control for   <7.0% adults with diabetes     CBG: Recent Labs  Lab 04/02/19 2103  GLUCAP 260*    Review of Systems:   Except for above in HPI, review of systems was negative.  Past Medical  History  She,  has a past medical history of CHF (congestive heart failure) ([place]), Chronic /permanent atrial fibrillation ([place]) (2005), Chronic diastolic heart failure, NYHA class 2 ([place]), Diabetic lumbosacral plexopathy ([place]), Fibromyalgia, Generalized anxiety disorder, Hypokalemia (09/03/2017), Incontinence, Major depressive disorder, OSA on CPAP, Restless leg syndrome, Syncope (09/03/2017), and Type II diabetes mellitus ([place]).   Surgical History    Past Surgical History:  Procedure Laterality Date  . CARDIAC CATHETERIZATION  11/09/2015   [facility]: Nonocclusive CAD. EF 55%. EDP 7 mmHg.  Marland Kitchen CHOLECYSTECTOMY OPEN    . JOINT REPLACEMENT    . KNEE ARTHROSCOPY Right   . MASS EXCISION Left    sarcoma - shoulder  . SHOULDER OPEN ROTATOR CUFF REPAIR Bilateral   . TONSILLECTOMY    . TOTAL HIP ARTHROPLASTY Right 2015  . TOTAL HIP REVISION Right 2015  . TOTAL KNEE ARTHROPLASTY Right   . TRANSTHORACIC ECHOCARDIOGRAM  11/2013; 02/2016   a) [facility]: with Definity:  EF 60-65%. Mod LA dilation. Mild AoV sclerosis. Normal RHP. Indeterminate LV filling pressures. b) [facility]: Normal cavity size. EF 55-60%.no RWMA, mild AoV sclerosis (R cusp), Mod MAC. Mild biAtrial dilation.  . TRANSTHORACIC ECHOCARDIOGRAM  08/2017    Mild LVH with normal EF 55-60%.  Unable to fully assess wall motion, but appear to be normal.  Unable to determine diastolic function because of A. fib.  Mild to moderate MAC with mild mitral stenosis (mean gradient 9 mmHg).  Moderate LA dilation.  CVP estimated 3 mmHg.  No obvious evidence of pulmonary hypertension.  Marland Kitchen VAGINAL HYSTERECTOMY       Social History   reports that she has never smoked. She has never used smokeless tobacco. She reports that she does not drink alcohol or use drugs.   Family History   Her family history includes Heart disease in her mother; Hypertension in her mother; Leukemia in her son.   Allergies Allergies  Allergen Reactions  . Bee Venom  Hives    Takes benadryl  . Other Other (See Comments)    Raw foods with seeds give her "boils".  Avoids raw strawberries, blueberries. Tolerates cooked fruits, tomato sauce, bread/grains with seeds. [facility] 10/17/13 Berries with seeds Allergy to glutamine-C-quercet-selen-brom per [facility] 09/25/17  . Gluten Meal Other (See Comments)  Per MAR  . Alteplase Rash  . Cefepime Rash  . Fentanyl Other (See Comments)    Hallucinations, syncope    . Metformin And Related Diarrhea     Home Medications  Prior to Admission medications   Medication Sig Start Date End Date Taking? Authorizing Provider  acetaminophen (TYLENOL) 500 MG tablet Take 1,000 mg by mouth 3 (three) times daily.    Yes [provider]  ALPRAZolam (XANAX) 0.5 MG tablet Take 0.25 mg by mouth 3 (three) times daily.   Yes [provider]  ARIPiprazole (ABILIFY) 10 MG tablet Take 10 mg by mouth daily.   Yes [provider]  busPIRone (BUSPAR) 10 MG tablet Take 10 mg by mouth 2 (two) times daily.    Yes [provider]  ezetimibe (ZETIA) 10 MG tablet Take 10 mg by mouth at bedtime.    Yes [provider]  ferrous sulfate 325 (65 FE) MG tablet Take 325 mg by mouth daily with breakfast.    Yes [provider]  furosemide (LASIX) 20 MG tablet Take 1 tablet (20 mg total) by mouth daily. 11/19/18  Yes Domenic Polite, MD  gabapentin (NEURONTIN) 300 MG capsule Take 1 capsule (300 mg total) by mouth 3 (three) times daily. 11/19/18  Yes Domenic Polite, MD  lisinopril (ZESTRIL) 10 MG tablet Take 10 mg by mouth daily.   Yes [provider]  magnesium oxide (MAG-OX) 400 MG tablet Take 1 tablet (400 mg total) by mouth 3 (three) times daily. ALSO TAKE A EXTRA 400 MG TABLET ON THE DAYS YOU TAKE ZAROXOLYN TABLET Patient taking differently: Take 400 mg by mouth 3 (three) times daily.  02/02/18  Yes Leonie Man, MD  metoprolol succinate (TOPROL-XL) 25 MG 24 hr tablet Take 1.5 tablets (37.5 mg  total) by mouth daily. 11/20/18  Yes Domenic Polite, MD  omeprazole (PRILOSEC) 20 MG capsule Take 20 mg by mouth daily as needed (acid reflux/indigestion).    Yes [provider]  oxyCODONE (OXY IR/ROXICODONE) 5 MG immediate release tablet Take 5 mg by mouth 3 (three) times daily.   Yes [provider]  potassium chloride SA (K-DUR) 20 MEQ tablet Take 2 tablets (40 mEq total) by mouth daily. TAKE AN EXTRA 20 MEQ ON THE DAYS YOU TAKE ZAROXLYN Patient taking differently: Take 20 mEq by mouth daily.  11/19/18  Yes Domenic Polite, MD  pramipexole (MIRAPEX) 0.125 MG tablet Take 0.125 mg by mouth at bedtime.    Yes [provider]  sertraline (ZOLOFT) 100 MG tablet Take 100 mg by mouth at bedtime.    Yes [provider]  spironolactone (ALDACTONE) 25 MG tablet Take 0.5 tablets (12.5 mg total) by mouth daily. 11/20/18  Yes Domenic Polite, MD  tiZANidine (ZANAFLEX) 4 MG tablet Take 4 mg by mouth 3 (three) times daily.    Yes [provider]  warfarin (COUMADIN) 1 MG tablet Take 1 mg by mouth daily. Take one tablet (1 mg) with a 5 mg and 2 mg tablet (8 mg dose) on Saturdays at 4pm   Yes [provider]  warfarin (COUMADIN) 2 MG tablet Take 2 mg by mouth See admin instructions. Take one tablet (2 mg) with a 5 mg and 1 mg tablet (8 mg dose) on Saturdays at 4 pm   Yes [provider]  warfarin (COUMADIN) 5 MG tablet Take 5 mg by mouth See admin instructions. Take one tablet (5 mg) by mouth on Fridays at 4pm, take one tablet (5  mg) with a 1 mg and 2 mg tablet (8 mg dose) on Saturdays at 4pm   Yes [provider]  ALPRAZolam (XANAX) 0.25 MG tablet Take 1 tablet (0.25 mg total) by mouth 3 (three) times daily. Patient not taking: Reported on 04/02/2019 11/19/18   Domenic Polite, MD  docusate sodium (COLACE) 100 MG capsule Take 100 mg by mouth daily as needed for mild constipation.    [provider]  Dulaglutide (TRULICITY) 1.5 MT/9.7DK  SOPN Inject 1.5 mg into the skin every Saturday.     [provider]  insulin detemir (LEVEMIR) 100 UNIT/ML injection Inject 0.4 mLs (40 Units total) into the skin at bedtime. Patient not taking: Reported on 01/28/2019 09/30/17   Georgette Shell, MD  Insulin Glargine [facility]) 100 UNIT/ML SOPN Inject 40 Units into the skin at bedtime.    [provider]  insulin regular (NOVOLIN R) 100 units/mL injection Inject 0-12 Units into the skin 3 (three) times daily before meals. 0-200 0 units 201-250 6 units 251-300 8 units 301-350 10 units 351-400 12 units Greater than 400 call MD    [provider]  lidocaine (LIDODERM) 5 % Place 1 patch onto the skin daily as needed (right hip pain). Remove & Discard patch within 12 hours or as directed by MD     [provider]  ondansetron (ZOFRAN) 4 MG tablet Take 1 tablet (4 mg total) by mouth every 6 (six) hours as needed for nausea. 09/30/17   Georgette Shell, MD     Critical care time: 39

## 2019-04-03 NOTE — Progress Notes (Signed)
Pt HR increasing 120-130's MD made aware. Advised to titrate levophed gtt off and see if pressure will maintain and will readdress.

## 2019-04-03 NOTE — ED Provider Notes (Signed)
Assumed care from [place] at shift change.  See prior notes for full H&P.  Briefly, 69 year old female presenting to the ED initially as code stroke but found to be febrile, tachycardic, and likely septic.  Neurology did evaluate and recommended infectious work-up, consider MRI if no obvious source of her encephalopathy.  Initial lactate 2.4, white blood cell count 12.5.  She was given IV fluid bolus as well as broad-spectrum antibiotics.  COVID screen negative.  Plan: At shift change, CT of the abdomen and pelvis as well as urinalysis pending.  Will require admission.  Results for orders placed or performed during the hospital encounter of 04/02/19  Respiratory Panel by RT PCR (Flu A&B, Covid) - Nasopharyngeal Swab   Specimen: Nasopharyngeal Swab  Result Value Ref Range   SARS Coronavirus 2 by RT PCR NEGATIVE NEGATIVE   Influenza A by PCR NEGATIVE NEGATIVE   Influenza B by PCR NEGATIVE NEGATIVE  Protime-INR  Result Value Ref Range   Prothrombin Time 24.4 (H) 11.4 - 15.2 seconds   INR 2.2 (H) 0.8 - 1.2  APTT  Result Value Ref Range   aPTT 38 (H) 24 - 36 seconds  CBC  Result Value Ref Range   WBC 12.5 (H) 4.0 - 10.5 K/uL   RBC 5.08 3.87 - 5.11 MIL/uL   Hemoglobin 11.9 (L) 12.0 - 15.0 g/dL   HCT 40.4 36.0 - 46.0 %   MCV 79.5 (L) 80.0 - 100.0 fL   MCH 23.4 (L) 26.0 - 34.0 pg   MCHC 29.5 (L) 30.0 - 36.0 g/dL   RDW 19.6 (H) 11.5 - 15.5 %   Platelets 207 150 - 400 K/uL   nRBC 0.0 0.0 - 0.2 %  Differential  Result Value Ref Range   Neutrophils Relative % 95 %   Neutro Abs 11.7 (H) 1.7 - 7.7 K/uL   Lymphocytes Relative 3 %   Lymphs Abs 0.4 (L) 0.7 - 4.0 K/uL   Monocytes Relative 2 %   Monocytes Absolute 0.3 0.1 - 1.0 K/uL   Eosinophils Relative 0 %   Eosinophils Absolute 0.0 0.0 - 0.5 K/uL   Basophils Relative 0 %   Basophils Absolute 0.0 0.0 - 0.1 K/uL   Immature Granulocytes 0 %   Abs Immature Granulocytes 0.04 0.00 - 0.07 K/uL  Comprehensive metabolic panel  Result Value  Ref Range   Sodium 136 135 - 145 mmol/L   Potassium 3.3 (L) 3.5 - 5.1 mmol/L   Chloride 98 98 - 111 mmol/L   CO2 25 22 - 32 mmol/L   Glucose, Bld 284 (H) 70 - 99 mg/dL   BUN 17 8 - 23 mg/dL   Creatinine, Ser 1.14 (H) 0.44 - 1.00 mg/dL   Calcium 9.1 8.9 - 10.3 mg/dL   Total Protein 7.4 6.5 - 8.1 g/dL   Albumin 3.7 3.5 - 5.0 g/dL   AST 26 15 - 41 U/L   ALT 9 0 - 44 U/L   Alkaline Phosphatase 81 38 - 126 U/L   Total Bilirubin 1.5 (H) 0.3 - 1.2 mg/dL   GFR calc non Af Amer 49 (L) >60 mL/min   GFR calc Af Amer 57 (L) >60 mL/min   Anion gap 13 5 - 15  Ammonia  Result Value Ref Range   Ammonia 37 (H) 9 - 35 umol/L  Lactic acid, plasma  Result Value Ref Range   Lactic Acid, Venous 2.4 (HH) 0.5 - 1.9 mmol/L  Lactic acid, plasma  Result Value Ref Range  Lactic Acid, Venous 1.9 0.5 - 1.9 mmol/L  Urinalysis, Routine w reflex microscopic  Result Value Ref Range   Color, Urine YELLOW YELLOW   APPearance CLOUDY (A) CLEAR   Specific Gravity, Urine 1.014 1.005 - 1.030   pH 5.0 5.0 - 8.0   Glucose, UA NEGATIVE NEGATIVE mg/dL   Hgb urine dipstick SMALL (A) NEGATIVE   Bilirubin Urine NEGATIVE NEGATIVE   Ketones, ur NEGATIVE NEGATIVE mg/dL   Protein, ur NEGATIVE NEGATIVE mg/dL   Nitrite NEGATIVE NEGATIVE   Leukocytes,Ua LARGE (A) NEGATIVE   RBC / HPF 0-5 0 - 5 RBC/hpf   WBC, UA >50 (H) 0 - 5 WBC/hpf   Bacteria, UA RARE (A) NONE SEEN   Squamous Epithelial / LPF 0-5 0 - 5   WBC Clumps PRESENT    Hyaline Casts, UA PRESENT   I-stat chem 8, ED  Result Value Ref Range   Sodium 137 135 - 145 mmol/L   Potassium 3.3 (L) 3.5 - 5.1 mmol/L   Chloride 97 (L) 98 - 111 mmol/L   BUN 17 8 - 23 mg/dL   Creatinine, Ser 0.90 0.44 - 1.00 mg/dL   Glucose, Bld 276 (H) 70 - 99 mg/dL   Calcium, Ion 1.08 (L) 1.15 - 1.40 mmol/L   TCO2 27 22 - 32 mmol/L   Hemoglobin 13.9 12.0 - 15.0 g/dL   HCT 41.0 36.0 - 46.0 %  CBG monitoring, ED  Result Value Ref Range   Glucose-Capillary 260 (H) 70 - 99 mg/dL  POC  SARS Coronavirus 2 Ag-ED - Nasal Swab (BD Veritor Kit)  Result Value Ref Range   SARS Coronavirus 2 Ag NEGATIVE NEGATIVE  POCT I-Stat EG7  Result Value Ref Range   pH, Ven 7.463 (H) 7.250 - 7.430   pCO2, Ven 37.8 (L) 44.0 - 60.0 mmHg   pO2, Ven 66.0 (H) 32.0 - 45.0 mmHg   Bicarbonate 27.1 20.0 - 28.0 mmol/L   TCO2 28 22 - 32 mmol/L   O2 Saturation 94.0 %   Acid-Base Excess 3.0 (H) 0.0 - 2.0 mmol/L   Sodium 138 135 - 145 mmol/L   Potassium 3.1 (L) 3.5 - 5.1 mmol/L   Calcium, Ion 1.08 (L) 1.15 - 1.40 mmol/L   HCT 40.0 36.0 - 46.0 %   Hemoglobin 13.6 12.0 - 15.0 g/dL   Patient temperature HIDE    Sample type VENOUS    CT ABDOMEN PELVIS W CONTRAST  Result Date: 04/03/2019 CLINICAL DATA:  Abdominal pain, diarrhea, sepsis EXAM: CT ABDOMEN AND PELVIS WITH CONTRAST TECHNIQUE: Multidetector CT imaging of the abdomen and pelvis was performed using the standard protocol following bolus administration of intravenous contrast. CONTRAST:  130m OMNIPAQUE IOHEXOL 300 MG/ML  SOLN COMPARISON:  CT abdomen pelvis 01/28/2019, CT abdomen pelvis 09/03/2017 FINDINGS: Lower chest: Lung bases are clear. Borderline cardiomegaly. Dense mitral annular calcifications. Coronary artery calcifications are present. No pericardial effusion. Hepatobiliary: No focal liver abnormality is seen. Patient is post cholecystectomy. Slight prominence of the biliary tree likely related to reservoir effect. No calcified intraductal gallstones. Pancreas: Unremarkable. No pancreatic ductal dilatation or surrounding inflammatory changes. Spleen: Mild splenomegaly. No focal splenic lesions. Adrenals/Urinary Tract: Normal adrenal glands. There is bilateral perinephric stranding, nonspecific. Multiple bilateral fluid attenuation cysts are seen in both kidneys, largest is a bilobed cysts in the left upper pole measuring 2.4 cm. Bilateral cortical scarring is noted as well. There is mild right hydroureteronephrosis without visible obstructing  calculus though the distal ureter and ureterovesicular junction is largely obscured  by streak artifact from patient's right hip prosthesis. No left urinary tract dilatation or calcification. Question some mild faint perivesicular stranding. Stomach/Bowel: Distal esophagus, stomach and duodenal sweep are unremarkable. No small bowel wall thickening or dilatation. No evidence of obstruction. A normal appendix is visualized. Scattered colonic diverticula without focal pericolonic inflammation to suggest diverticulitis. Vascular/Lymphatic: Atherosclerotic plaque within the normal caliber aorta. No suspicious or enlarged lymph nodes in the included lymphatic chains. Reproductive: Uterus is surgically absent. No concerning adnexal lesions. Other: No abdominopelvic free fluid or air. Right abdominal wall laxity. Circumferential body wall edema. There is focal rim enhancing fluid collection superficial to the sacrum measuring 3.4 x 1.2 cm (3/68). Small punctate focus of gas is noted as well (7/81). Additionally, there is an indeterminate presacral structure, either fluid collection or intermediate attenuation mass measuring 50 Hounsfield units. This is not significantly changed in size or appearance from 22/29/7989 though certainly reflect some interval increase in size measuring up to 4 cm on today's exam, previously 3 cm on study from September 03, 2017. Musculoskeletal: There is right abdominal wall laxity with notable atrophy of the right rectus sheath. Multilevel degenerative changes are present in the spine including bony fusion across many of the lower lumbar levels and severe interspinous arthrosis. Abrupt angulation of the sacrococcygeal junction is noted and is in close proximity to presacral collection, clear these are related findings. IMPRESSION: 1. 3.4 x 1.2 cm rim enhancing fluid collection superficial to the sacrum is suspicious for abscess. Punctate foci is of adjacent gas could be seen with decubitus ulceration  but more aggressive soft tissue infection should be excluded on a clinical basis. 2. Indeterminate presacral lesion, increasing in size since 2019. Could be related to the adjacent fracture of the sacrococcygeal junction though overall remains indeterminate. 3. Bladder wall thickening with some urothelial thickening particular on the right concerning for potential ascending urinary tract infection. No visualized urolithiasis though distal evaluation is limited by streak from right hip arthroplasty. 4. Circumferential body wall edema. Correlate for features of anasarca. 5.  Aortic Atherosclerosis (ICD10-I70.0). And Critical Value/emergent results were called by telephone at the time of interpretation on 04/03/2019 at 1:45 am to provider [facility] , who verbally acknowledged these results. Electronically Signed   By: Lovena Le M.D.   On: 04/03/2019 01:45   DG Chest Port 1 View  Result Date: 04/02/2019 CLINICAL DATA:  Cough.  Hypoxia. EXAM: PORTABLE CHEST 1 VIEW COMPARISON:  01/28/2019 FINDINGS: The heart size is enlarged. Aortic calcifications are noted. There is no pneumothorax. No large pleural effusion. No focal infiltrate. Degenerative changes are noted of both glenohumeral joints and both AC joints. There is no obvious acute osseous abnormality. IMPRESSION: No active disease. Electronically Signed   By: Constance Holster M.D.   On: 04/02/2019 22:37   CT HEAD CODE STROKE WO CONTRAST  Result Date: 04/02/2019 CLINICAL DATA:  Code stroke. 69 year old female with abnormal speech. EXAM: CT HEAD WITHOUT CONTRAST TECHNIQUE: Contiguous axial images were obtained from the base of the skull through the vertex without intravenous contrast. COMPARISON:  Brain MRI 06/18/2011. Head CT 09/24/2017. FINDINGS: Brain: Chronic partially empty sella. Stable cerebral volume. No midline shift, ventriculomegaly, mass effect, evidence of mass lesion, intracranial hemorrhage or evidence of cortically based acute infarction.  Mild for age scattered white matter hypodensity appears stable. No cortical encephalomalacia identified. Vascular: Calcified atherosclerosis at the skull base. No suspicious intracranial vascular hyperdensity. Skull: No acute osseous abnormality identified. Sinuses/Orbits: Visualized paranasal sinuses and mastoids are stable  and well pneumatized. Other: No acute orbit or scalp soft tissue finding. ASPECTS [facility] Stroke Program Early CT Score) Total score (0-10 with 10 being normal): 10 IMPRESSION: Stable since 2019 and largely negative for age noncontrast CT appearance of the brain. ASPECTS 10. These results were communicated to Dr. Lorraine Lax at 9:16 pm on 1/30/2021by text page via the [facility] messaging system. Electronically Signed   By: Genevie Ann M.D.   On: 04/02/2019 21:17   CT with findings of rim-enhancing fluid collection superficial to the sacrum concerning for abscess.  She does have a decubitus ulcer of this region has been present for quite some time.  Also has some bladder wall thickening, particularly on the right.  UA here with >50 WBC, large leuks.  Will send for culture.    1:59 AM On reassessment, patient hypotensive in the 54'M systolic.  She already received 2500 IVF.  Will give additional liter to get her to the 30cc/kg bolus.  If no improvement with BP's, may need pressors.  2:23 AM After additional liter bolus, BP now into the 08'Q systolic.  Will start levophed and consult critical care for admission.    2:37 AM] Discussed with Dr. James Ivanoff with critical care.  Ground team will see patient.  Requested general surgery consultation regarding abscess.   2:43 AM Discussed with general surgery, Dr. Grandville Silos-- team will see in consult and provide recommendations.  Continue IV abx for now.   Critical care has evaluated and will admit for ongoing care.   CRITICAL CARE Performed by: Larene Pickett   Total critical care time: 45 minutes  Critical care time was exclusive of separately  billable procedures and treating other patients.  Critical care was necessary to treat or prevent imminent or life-threatening deterioration.  Critical care was time spent personally by me on the following activities: development of treatment plan with patient and/or surrogate as well as nursing, discussions with consultants, evaluation of patient's response to treatment, examination of patient, obtaining history from patient or surrogate, ordering and performing treatments and interventions, ordering and review of laboratory studies, ordering and review of radiographic studies, pulse oximetry and re-evaluation of patient's condition.     Larene Pickett, PA-C 04/03/19 [place], April, MD 04/03/19 [phone]

## 2019-04-03 NOTE — Progress Notes (Signed)
ANTICOAGULATION CONSULT NOTE - Initial Consult  Pharmacy Consult for Coumadin Indication: atrial fibrillation  Allergies  Allergen Reactions  . Bee Venom Hives    Takes benadryl  . Other Other (See Comments)    Raw foods with seeds give her "boils".  Avoids raw strawberries, blueberries. Tolerates cooked fruits, tomato sauce, bread/grains with seeds. [facility] 10/17/13 Berries with seeds Allergy to glutamine-C-quercet-selen-brom per [facility] 09/25/17  . Gluten Meal Other (See Comments)    Per MAR  . Alteplase Rash  . Cefepime Rash  . Fentanyl Other (See Comments)    Hallucinations, syncope    . Metformin And Related Diarrhea    Patient Measurements: Height: 5\' 11"  (180.3 cm) Weight: 252 lb 10.4 oz (114.6 kg)(bedscale on admission, one blanket, one pillow, one sheet) IBW/kg (Calculated) : 70.8  Vital Signs: Temp: 98.9 F (37.2 C) (01/31 0849) Temp Source: Oral (01/31 0849) BP: 80/65 (01/31 1137) Pulse Rate: 112 (01/31 1137)  Labs: Recent Labs    04/02/19 2100 04/02/19 2100 04/02/19 2112 04/02/19 2112 04/02/19 2142 04/03/19 0401  HGB 11.9*   < > 13.9   < > 13.6 11.5*  HCT 40.4   < > 41.0  --  40.0 39.3  PLT 207  --   --   --   --  264  APTT 38*  --   --   --   --   --   LABPROT 24.4*  --   --   --   --  24.1*  INR 2.2*  --   --   --   --  2.2*  CREATININE 1.14*  --  0.90  --   --  0.92   < > = values in this interval not displayed.    Estimated Creatinine Clearance: 81.6 mL/min (by C-G formula based on SCr of 0.92 mg/dL).   Medical History: Past Medical History:  Diagnosis Date  . CHF (congestive heart failure) ([place])   . Chronic /permanent atrial fibrillation ([place]) 2005   On Warfarin (follwed @ [facility]); Rate controlled - On BB  . Chronic diastolic heart failure, NYHA class 2 ([place])    Updated by A. fib, hypertensive heart disease and obesity; EDP was only 7 by cardiac catheterization in September 2017  . Diabetic lumbosacral plexopathy ([place])    . Fibromyalgia   . Generalized anxiety disorder   . Hypokalemia 09/03/2017  . Incontinence   . Major depressive disorder   . OSA on CPAP   . Restless leg syndrome   . Syncope 09/03/2017  . Type II diabetes mellitus [facility])     Assessment: 69 yo female on chronic Coumadin for afib, admitted 1/30 with sepsis.  Pharmacy asked to resume anticoagulation with Coumadin.  INR 2.2 today.  PTA dose a bit confusing, I think normally she takes Coumadin 8 mg daily, but it seems pt had elevated INR Monday (4.4) she didn't take any Tuesday, 6 mg Wed, None Thurs, 7 mg Fri and supposed to take 8 Sat, but missed dose because was admitted to the hospital.  Goal of Therapy:  INR 2-3 Monitor platelets by anticoagulation protocol: Yes   Plan:  1. Coumadin 7.5 mg po x 1 tonight. 2. Daily PT/INR.  Marguerite Olea, [facility] Clinical Pharmacist Phone [phone]  04/03/2019 12:12 PM

## 2019-04-04 ENCOUNTER — Other Ambulatory Visit: Payer: Self-pay

## 2019-04-04 ENCOUNTER — Encounter (HOSPITAL_COMMUNITY): Payer: Self-pay | Admitting: Pulmonary Disease

## 2019-04-04 ENCOUNTER — Inpatient Hospital Stay: Payer: Self-pay

## 2019-04-04 DIAGNOSIS — L89159 Pressure ulcer of sacral region, unspecified stage: Secondary | ICD-10-CM

## 2019-04-04 DIAGNOSIS — R413 Other amnesia: Secondary | ICD-10-CM

## 2019-04-04 DIAGNOSIS — Z881 Allergy status to other antibiotic agents status: Secondary | ICD-10-CM

## 2019-04-04 DIAGNOSIS — L02212 Cutaneous abscess of back [any part, except buttock]: Secondary | ICD-10-CM

## 2019-04-04 DIAGNOSIS — M4628 Osteomyelitis of vertebra, sacral and sacrococcygeal region: Secondary | ICD-10-CM

## 2019-04-04 DIAGNOSIS — I4891 Unspecified atrial fibrillation: Secondary | ICD-10-CM

## 2019-04-04 DIAGNOSIS — E1169 Type 2 diabetes mellitus with other specified complication: Secondary | ICD-10-CM

## 2019-04-04 DIAGNOSIS — Z885 Allergy status to narcotic agent status: Secondary | ICD-10-CM

## 2019-04-04 DIAGNOSIS — Z91030 Bee allergy status: Secondary | ICD-10-CM

## 2019-04-04 DIAGNOSIS — Z91018 Allergy to other foods: Secondary | ICD-10-CM

## 2019-04-04 DIAGNOSIS — G4733 Obstructive sleep apnea (adult) (pediatric): Secondary | ICD-10-CM

## 2019-04-04 DIAGNOSIS — Z888 Allergy status to other drugs, medicaments and biological substances status: Secondary | ICD-10-CM

## 2019-04-04 DIAGNOSIS — I5032 Chronic diastolic (congestive) heart failure: Secondary | ICD-10-CM

## 2019-04-04 DIAGNOSIS — Z7901 Long term (current) use of anticoagulants: Secondary | ICD-10-CM

## 2019-04-04 LAB — CBC
HCT: 37.3 % (ref 36.0–46.0)
Hemoglobin: 10.9 g/dL — ABNORMAL LOW (ref 12.0–15.0)
MCH: 23.1 pg — ABNORMAL LOW (ref 26.0–34.0)
MCHC: 29.2 g/dL — ABNORMAL LOW (ref 30.0–36.0)
MCV: 79.2 fL — ABNORMAL LOW (ref 80.0–100.0)
Platelets: 176 10*3/uL (ref 150–400)
RBC: 4.71 MIL/uL (ref 3.87–5.11)
RDW: 19.8 % — ABNORMAL HIGH (ref 11.5–15.5)
WBC: 7.1 10*3/uL (ref 4.0–10.5)
nRBC: 0 % (ref 0.0–0.2)

## 2019-04-04 LAB — BASIC METABOLIC PANEL
Anion gap: 12 (ref 5–15)
BUN: 10 mg/dL (ref 8–23)
CO2: 26 mmol/L (ref 22–32)
Calcium: 9.1 mg/dL (ref 8.9–10.3)
Chloride: 97 mmol/L — ABNORMAL LOW (ref 98–111)
Creatinine, Ser: 0.87 mg/dL (ref 0.44–1.00)
GFR calc Af Amer: >60 mL/min (ref >60)
GFR calc non Af Amer: >60 mL/min (ref >60)
Glucose, Bld: 152 mg/dL — ABNORMAL HIGH (ref 70–99)
Potassium: 3.3 mmol/L — ABNORMAL LOW (ref 3.5–5.1)
Sodium: 135 mmol/L (ref 135–145)

## 2019-04-04 LAB — PROTIME-INR
INR: 1.8 — ABNORMAL HIGH (ref 0.8–1.2)
Prothrombin Time: 20.3 seconds — ABNORMAL HIGH (ref 11.4–15.2)

## 2019-04-04 LAB — MAGNESIUM: Magnesium: 1.7 mg/dL (ref 1.7–2.4)

## 2019-04-04 LAB — GLUCOSE, CAPILLARY
Glucose-Capillary: 151 mg/dL — ABNORMAL HIGH (ref 70–99)
Glucose-Capillary: 189 mg/dL — ABNORMAL HIGH (ref 70–99)
Glucose-Capillary: 165 mg/dL — ABNORMAL HIGH (ref 70–99)

## 2019-04-04 MED ORDER — WARFARIN SODIUM 7.5 MG PO TABS
7.5000 mg | ORAL_TABLET | Freq: Once | ORAL | Status: AC
  Administered 2019-04-04: 17:00:00 7.5 mg via ORAL
  Filled 2019-04-04: qty 1

## 2019-04-04 MED ORDER — POTASSIUM CHLORIDE CRYS ER 20 MEQ PO TBCR
20.0000 meq | EXTENDED_RELEASE_TABLET | ORAL | Status: AC
  Administered 2019-04-04 (×2): 20 meq via ORAL
  Filled 2019-04-04 (×2): qty 1

## 2019-04-04 NOTE — Consult Note (Signed)
Date of Admission:  04/02/2019          Reason for Consult:  abscess   Referring Provider: Dr. Lynetta Mare   Assessment:  1. Superficial  abscess near sacral wound and chronic sacral ulcer 2. Deeper presacral pathology that could represent fracture that is increasing in size radiology compared to a scan in 2019 but could also potentially be osteomyelitis 3. Gram-positive cocci in 1 culture 4. Chronic sacral decubitus ulcer  Plan:  1. Would  Ask General surgery to see the patient and debride the abscess and send specimens for culture 2. Follow-up on culture data from blood and abscess culture and narrow antibiotics as able 3. Pseudomonas in urine which seems more likely to be a colonizer 4. Check sed rate and CRP 5. Repeat blood cultures 6. Consider MRI sacrum with contrast when able  Active Problems:   Sepsis ([place])   Pressure injury of skin   Scheduled Meds: . ALPRAZolam  0.25 mg Oral TID  . ARIPiprazole  10 mg Oral Daily  . busPIRone  10 mg Oral BID  . chlorhexidine  15 mL Mouth Rinse BID  . Chlorhexidine Gluconate Cloth  6 each Topical Daily  . ezetimibe  10 mg Oral QHS  . gabapentin  300 mg Oral TID  . insulin aspart  0-15 Units Subcutaneous TID WC  . insulin aspart  0-5 Units Subcutaneous QHS  . insulin glargine  25 Units Subcutaneous QHS  . mouth rinse  15 mL Mouth Rinse q12n4p  . mouth rinse  15 mL Mouth Rinse BID  . metoprolol tartrate  12.5 mg Oral BID  . pantoprazole  40 mg Oral Daily  . pramipexole  0.125 mg Oral QHS  . sertraline  100 mg Oral QHS  . warfarin  7.5 mg Oral ONCE-1800  . Warfarin - Pharmacist Dosing Inpatient   Does not apply q1800   Continuous Infusions: . norepinephrine (LEVOPHED) Adult infusion Stopped (04/03/19 1539)  . piperacillin-tazobactam (ZOSYN)  IV 12.5 mL/hr at 04/04/19 0900  . vancomycin 1,000 mg (04/04/19 1029)   PRN Meds:.oxyCODONE  HPI: Donna [name] is a 69 y.o. female with multiple medical problems including atrial  fibrillation on Coumadin, obstructive sleep apnea heart failure diabetes mellitus with a chronic sacral decubitus ulcer.  She was admitted to the ICU in septic shock.  Blood cultures were taken and she was initiated on broad-spectrum antibiotics in the form of vancomycin and Zosyn.  Since then 1 blood culture is turned positive for a gram-positive coccus though we do not know its identity yet.  She is growing Pseudomonas from the urine from it in and out catheter.  She has a chronic sacral decubitus ulcer that was evaluated by general surgery.  She is in the interim had a CT scan performed which shows a 3.4 x 1.2 cm rim-enhancing fluid collection that is superficial and near the sacrum concerning for abscess with potential gas.  There is also a presacral region that is ventral to the sacrum that has increased in size versus 2019 but thought to potentially represent a fracture but certainly could also represent osteomyelitis.  Dr. Doyne Keel had thought the abscess was not adjacent to the skin but my read of the CT scans is that it IS superficial and should be amenable to I and D.  The prescral area clearly is not something that could be assessed absent surgery  I suspect she will need a 6-week course of IV antibiotics in case she has got  underlying sacral osteomyelitis   Review of Systems: Review of Systems  Unable to perform ROS: Critical illness    Past Medical History:  Diagnosis Date  . CHF (congestive heart failure) ([place])   . Chronic /permanent atrial fibrillation ([place]) 2005   On Warfarin (follwed @ [facility]); Rate controlled - On BB  . Chronic diastolic heart failure, NYHA class 2 ([place])    Updated by A. fib, hypertensive heart disease and obesity; EDP was only 7 by cardiac catheterization in September 2017  . Diabetic lumbosacral plexopathy ([place])   . Fibromyalgia   . Generalized anxiety disorder   . Hypokalemia 09/03/2017  . Incontinence   . Major depressive  disorder   . OSA on CPAP   . Restless leg syndrome   . Syncope 09/03/2017  . Type II diabetes mellitus (H[facility])     Social History   Tobacco Use  . Smoking status: Never Smoker  . Smokeless tobacco: Never Used  Substance Use Topics  . Alcohol use: No  . Drug use: No    Family History  Problem Relation Age of Onset  . Hypertension Mother   . Heart disease Mother   . Leukemia Son    Allergies  Allergen Reactions  . Bee Venom Hives    Takes benadryl  . Other Other (See Comments)    Raw foods with seeds give her "boils".  Avoids raw strawberries, blueberries. Tolerates cooked fruits, tomato sauce, bread/grains with seeds. [facility] 10/17/13 Berries with seeds Allergy to glutamine-C-quercet-selen-brom per [facility] 09/25/17  . Gluten Meal Other (See Comments)    Per MAR  . Alteplase Rash  . Cefepime Rash  . Fentanyl Other (See Comments)    Hallucinations, syncope    . Metformin And Related Diarrhea    OBJECTIVE: Blood pressure 105/76, pulse (!) 103, temperature 100 F (37.8 C), temperature source Axillary, resp. rate 17, height 5\' 11"  (1.803 m), weight 114.3 kg, SpO2 95 %.  Physical Exam Constitutional:      Appearance: She is ill-appearing.  HENT:     Head: Normocephalic and atraumatic.  Eyes:     Extraocular Movements: Extraocular movements intact.     Conjunctiva/sclera: Conjunctivae normal.  Cardiovascular:     Rate and Rhythm: Rhythm irregular.     Heart sounds: No murmur. No friction rub. No gallop.   Pulmonary:     Effort: Pulmonary effort is normal. No respiratory distress.     Breath sounds: Normal breath sounds. No stridor. No wheezing or rhonchi.  Abdominal:     General: Bowel sounds are normal. There is no distension.     Palpations: Abdomen is soft.  Musculoskeletal:        General: No swelling or deformity.  Skin:    General: Skin is dry.     Coloration: Skin is pale.  Neurological:     General: No focal deficit present.     Mental Status: She is alert.    Psychiatric:        Attention and Perception: Attention normal.        Speech: Speech is delayed.        Behavior: Behavior is cooperative.        Thought Content: Thought content normal.        Cognition and Memory: She exhibits impaired recent memory.        Judgment: Judgment normal.     Lab Results Lab Results  Component Value Date   WBC 7.1 04/04/2019   HGB 10.9 (  L) 04/04/2019   HCT 37.3 04/04/2019   MCV 79.2 (L) 04/04/2019   PLT 176 04/04/2019    Lab Results  Component Value Date   CREATININE 0.87 04/04/2019   BUN 10 04/04/2019   NA 135 04/04/2019   K 3.3 (L) 04/04/2019   CL 97 (L) 04/04/2019   CO2 26 04/04/2019    Lab Results  Component Value Date   ALT 9 04/02/2019   AST 26 04/02/2019   ALKPHOS 81 04/02/2019   BILITOT 1.5 (H) 04/02/2019     Microbiology: Recent Results (from the past 240 hour(s))  Blood culture (routine x 2)     Status: None (Preliminary result)   Collection Time: 04/02/19  9:35 PM   Specimen: BLOOD  Result Value Ref Range Status   Specimen Description BLOOD RIGHT ARM  Final   Special Requests   Final    BOTTLES DRAWN AEROBIC AND ANAEROBIC Blood Culture adequate volume Performed at [facility], [place] [address] [place], [place] [address]    Culture  Setup Time   Final    GRAM POSITIVE COCCI IN CLUSTERS AEROBIC BOTTLE ONLY CRITICAL RESULT CALLED TO, READ BACK BY AND VERIFIED WITH: PHARMD JEREMY F. [id number]     Culture GRAM POSITIVE COCCI  Final   Report Status PENDING  Incomplete  Blood culture (routine x 2)     Status: None (Preliminary result)   Collection Time: 04/02/19  9:35 PM   Specimen: BLOOD  Result Value Ref Range Status   Specimen Description BLOOD RIGHT ARM  Final   Special Requests   Final    BOTTLES DRAWN AEROBIC AND ANAEROBIC Blood Culture results may not be optimal due to an inadequate volume of blood received in culture bottles   Culture   Final    NO GROWTH 2 DAYS Performed at [facility], [place] [address] [place], [place] [address]    Report Status PENDING  Incomplete  Respiratory Panel by RT PCR (Flu A&B, Covid) - Nasopharyngeal Swab     Status: None   Collection Time: 04/02/19 11:36 PM   Specimen: Nasopharyngeal Swab  Result Value Ref Range Status   SARS Coronavirus 2 by RT PCR NEGATIVE NEGATIVE Final    Comment: (NOTE) SARS-CoV-2 target nucleic acids are NOT DETECTED. The SARS-CoV-2 RNA is generally detectable in upper respiratoy specimens during the acute phase of infection. The lowest concentration of SARS-CoV-2 viral copies this assay can detect is 131 copies/mL. A negative result does not preclude SARS-Cov-2 infection and should not be used as the sole basis for treatment or other patient management decisions. A negative result may occur with  improper specimen collection/handling, submission of specimen other than nasopharyngeal swab, presence of viral mutation(s) within the areas targeted by this assay, and inadequate number of viral copies (<131 copies/mL). A negative result must be combined with clinical observations, patient history, and epidemiological information. The expected result is Negative. Fact Sheet for Patients:  PinkCheek.be Fact Sheet for Healthcare Providers:  GravelBags.it This test is not yet ap proved or cleared by the [place] FDA and  has been authorized for detection and/or diagnosis of SARS-CoV-2 by FDA under an Emergency Use Authorization (EUA). This EUA will remain  in effect (meaning this test can be used) for the duration of the COVID-19 declaration under Section 564(b)(1) of the Act, 21 U.S.C. section 360bbb-3(b)(1), unless the authorization is terminated or revoked sooner.    Influenza A by PCR NEGATIVE NEGATIVE Final   Influenza  B by PCR NEGATIVE NEGATIVE Final    Comment: (NOTE) The Xpert Xpress SARS-CoV-2/FLU/RSV assay is intended as an aid in  the diagnosis of influenza  from Nasopharyngeal swab specimens and  should not be used as a sole basis for treatment. Nasal washings and  aspirates are unacceptable for Xpert Xpress SARS-CoV-2/FLU/RSV  testing. Fact Sheet for Patients: PinkCheek.be Fact Sheet for Healthcare Providers: GravelBags.it This test is not yet approved or cleared by the [place] FDA and  has been authorized for detection and/or diagnosis of SARS-CoV-2 by  FDA under an Emergency Use Authorization (EUA). This EUA will remain  in effect (meaning this test can be used) for the duration of the  Covid-19 declaration under Section 564(b)(1) of the Act, 21  U.S.C. section 360bbb-3(b)(1), unless the authorization is  terminated or revoked. Performed at [facility], [place] [address], [place], [place] [address]   Urine culture     Status: Abnormal (Preliminary result)   Collection Time: 04/02/19 11:43 PM   Specimen: In/Out Cath Urine  Result Value Ref Range Status   Specimen Description IN/OUT CATH URINE  Final   Special Requests STERILE CONTAINER  Final   Culture (A)  Final    >=100,000 COLONIES/mL GRAM NEGATIVE RODS 10,000 COLONIES/mL PSEUDOMONAS AERUGINOSA IDENTIFICATION AND SUSCEPTIBILITIES TO FOLLOW Performed at [facility], [place] [address], [place], [place] [address]    Report Status PENDING  Incomplete  MRSA PCR Screening     Status: None   Collection Time: 04/03/19  6:01 AM   Specimen: Nasopharyngeal  Result Value Ref Range Status   MRSA by PCR NEGATIVE NEGATIVE Final    Comment:        The GeneXpert MRSA Assay (FDA approved for NASAL specimens only), is one component of a comprehensive MRSA colonization surveillance program. It is not intended to diagnose MRSA infection nor to guide or monitor treatment for MRSA infections. Performed at [facility], [place] [address], [place], [place] [address]     Alcide Evener, [facility] for  [facility] [phone] pager  04/04/2019, 12:49 PM

## 2019-04-04 NOTE — Progress Notes (Signed)
NAME:  Donna [name], MRN:  [id number], DOB:  [birth year], LOS: 1 ADMISSION DATE:  04/02/2019, CONSULTATION DATE: 04/03/2019 REFERRING MD: Emergency department, CHIEF COMPLAINT: Septic shock  Brief History   [name] with a history of Afib on warfarin, chronic sacral decubitus ulcer, OSA, diastolic heart failure, diabetes, restless leg syndrome, depression who presented initially as a code stroke for generalized "shaking," found to be in septic shock with 2 potential sources being sacral decub ulcer/possible abscess, and UTI.    History of present illness   Evidently, patient started trembling this evening around 730pm and was still able to talk to her husband.  Was not found to have a stroke on evaluation by neurology.  However was found to have low blood pressures and fever, concerning for septic shock.  She was given 3.5 L of fluid, however with persistently low blood pressures was started on Levophed and admitted to ICU.   She denies any abdominal pain, but did say that her sacral wound was painful and has been becoming more painful recently.   She currently is living with her husband with home health, has been wheelchair-bound up until a few weeks ago she is occasionally taking steps with a walker.  She denies any dysuria, but does have a history of UTIs.  Past Medical History   Past Medical History:  Diagnosis Date  . CHF (congestive heart failure) ([place])   . Chronic /permanent atrial fibrillation ([place]) 2005   On Warfarin (follwed @ [facility]); Rate controlled - On BB  . Chronic diastolic heart failure, NYHA class 2 ([place])    Updated by A. fib, hypertensive heart disease and obesity; EDP was only 7 by cardiac catheterization in September 2017  . Diabetic lumbosacral plexopathy ([place])   . Fibromyalgia   . Generalized anxiety disorder   . Hypokalemia 09/03/2017  . Incontinence   . Major depressive disorder   . OSA on CPAP   . Restless leg syndrome   . Syncope  09/03/2017  . Type II diabetes mellitus ([place])      Significant Hospital Events     Consults:  Surgery Neurology  Procedures:     Significant Diagnostic Tests:  CT abdomen pelvis with contrast 1/31. IMPRESSION: 1. 3.4 x 1.2 cm rim enhancing fluid collection superficial to the sacrum is suspicious for abscess. Punctate foci is of adjacent gas could be seen with decubitus ulceration but more aggressive soft tissue infection should be excluded on a clinical basis. 2. Indeterminate presacral lesion, increasing in size since 2019. Could be related to the adjacent fracture of the sacrococcygeal junction though overall remains indeterminate. 3. Bladder wall thickening with some urothelial thickening particular on the right concerning for potential ascending urinary tract infection. No visualized urolithiasis though distal evaluation is limited by streak from right hip arthroplasty. 4. Circumferential body wall edema. Correlate for features of anasarca. 5.  Aortic Atherosclerosis (ICD10-I70.0).  Micro Data:  Blood cultures 1/30 Urine culture 1/30 Covid PCR 1/30, negative  Antimicrobials:  Vancomycin 1/30 Aztreonam 1/30, stopped Metronidazole 1/30, stopped Zosyn 1/31  Interim history/subjective:  Patient is awake, able to interact She is having some right buttock discomfort  Objective   Blood pressure 105/76, pulse (!) 103, temperature 100 F (37.8 C), temperature source Axillary, resp. rate 17, height 5\' 11"  (1.803 m), weight 114.3 kg, SpO2 95 %.        Intake/Output Summary (Last 24 hours) at 04/04/2019 1515 Last data filed at 04/04/2019 0900 Gross per 24  hour  Intake 347.19 ml  Output 2900 ml  Net -2552.81 ml   Filed Weights   04/02/19 2100 04/03/19 0500 04/04/19 0411  Weight: 114.4 kg 114.6 kg 114.3 kg    Examination: General: Laying in bed on her back, no distress, comfortable HENT: Oropharynx clear, pupils equal Lungs: Clear bilaterally, no crackles, no  wheezes Cardiovascular: Atrial fibrillation, good rate control, soft systolic murmur Abdomen: Obese, soft, nontender, nondistended, positive bowel sounds Extremities: 1+ pretibial edema Neuro: Awake, alert, interacting appropriately, moves all extremities SKIN: Warm, dry, intact, except, there is a 3.5 x 1.5 x 0.5 cm sacral wound with healthy granulation tissue at the base, no drainage  Resolved Hospital Problem list     Assessment & Plan:  69 year old female presenting with septic shock and possible sacral abscess in the setting of a chronic sacral wound and possible UTI.  #Septic shock #Chronic sacral decubitus wound #Sacral fluid collection identified on CT #Possible UTI.  UA with large LE but nitrite negative Successfully weaned from levophed and restarted home metoprolol and MAP remains stable, patient complaining primarily of pain from sacral wound, without urinary sx. Pt seen by ID who recommended surgery see patient and send abscess for culture. Surgery was consulted previously and described the sacral wound as healthy appearing without evidence of abscess on exam, and therefore nothing to culture. ID also rec 6 weeks antibiotic therapy for possible osteomyelitis. Patient is responding well to current therapy with vanc/zosyn.   -Wound care consult pending -Repeat blood cultures -Sacral spine MRI to evaluate for osteomyelitis -Plan for transfer from ICU to medical floor  Chronic issues: #Chronic A. Fib: On Coumadin at home, INR therapeutic at 2.2.  Currently rate controlled on metoprolol.  -Continue to hold warfarin, if INR trends below 2, will start heparin drip for possible procedure.  #Depression/anxiety: Continue home Abilify, Zoloft, Buspar, and low-dose Xanax  #Restless leg syndrome: Continue pramipexole  #Diabetes: On Lantus 40 at home -Sliding-scale insulin -Lantus decreased to 25 units for now -Probably start a diet if no planned procedures    Best practice:    Diet: N.p.o. Pain/Anxiety/Delirium protocol (if indicated): N/A VAP protocol (if indicated): N/AA DVT prophylaxis: On therapeutic anticoagulation outpt, start heparin if INR goes below 2 GI prophylaxis: Pantoprazole Glucose control: SSI, lantus Mobility: When appropriate Code Status: Full Family Communication: reviewed plans with the patient 1/30 Disposition: ICU  Labs   CBC: Recent Labs  Lab 04/02/19 2100 04/02/19 2112 04/02/19 2142 04/03/19 0401 04/04/19 0338  WBC 12.5*  --   --  17.0* 7.1  NEUTROABS 11.7*  --   --   --   --   HGB 11.9* 13.9 13.6 11.5* 10.9*  HCT 40.4 41.0 40.0 39.3 37.3  MCV 79.5*  --   --  79.4* 79.2*  PLT 207  --   --  264 0000000    Basic Metabolic Panel: Recent Labs  Lab 04/02/19 2100 04/02/19 2112 04/02/19 2142 04/03/19 0401 04/04/19 0338  NA 136 137 138 138 135  K 3.3* 3.3* 3.1* 3.1* 3.3*  CL 98 97*  --  103 97*  CO2 25  --   --  23 26  GLUCOSE 284* 276*  --  208* 152*  BUN 17 17  --  14 10  CREATININE 1.14* 0.90  --  0.92 0.87  CALCIUM 9.1  --   --  8.3* 9.1  MG  --   --   --  1.6* 1.7   GFR: Estimated Creatinine Clearance: 86.2  mL/min (by C-G formula based on SCr of 0.87 mg/dL). Recent Labs  Lab 04/02/19 2100 04/02/19 2348 04/03/19 0401 04/04/19 0338  WBC 12.5*  --  17.0* 7.1  LATICACIDVEN 2.4* 1.9  --   --     Liver Function Tests: Recent Labs  Lab 04/02/19 2100  AST 26  ALT 9  ALKPHOS 81  BILITOT 1.5*  PROT 7.4  ALBUMIN 3.7   No results for input(s): LIPASE, AMYLASE in the last 168 hours. Recent Labs  Lab 04/02/19 2135  AMMONIA 37*    ABG    Component Value Date/Time   HCO3 27.1 04/02/2019 2142   TCO2 28 04/02/2019 2142   O2SAT 94.0 04/02/2019 2142     Coagulation Profile: Recent Labs  Lab 04/02/19 2100 04/03/19 0401 04/04/19 0338  INR 2.2* 2.2* 1.8*    Cardiac Enzymes: No results for input(s): CKTOTAL, CKMB, CKMBINDEX, TROPONINI in the last 168 hours.  HbA1C: Hgb A1c MFr Bld  Date/Time Value  Ref Range Status  04/03/2019 04:01 AM 6.0 (H) 4.8 - 5.6 % Final    Comment:    (NOTE) Pre diabetes:          5.7%-6.4% Diabetes:              >6.4% Glycemic control for   <7.0% adults with diabetes   11/13/2018 04:59 AM 7.5 (H) 4.8 - 5.6 % Final    Comment:    (NOTE) Pre diabetes:          5.7%-6.4% Diabetes:              >6.4% Glycemic control for   <7.0% adults with diabetes     CBG: Recent Labs  Lab 04/03/19 1248 04/03/19 1622 04/03/19 2144 04/04/19 0651 04/04/19 1132  GLUCAP 163* 158* 188* 151* 189*     Critical care time: 30 min

## 2019-04-04 NOTE — Progress Notes (Signed)
Will hold off on PICC placement per Dr. Tommy Medal . We will place  Once we have clearance from ID.

## 2019-04-04 NOTE — Progress Notes (Signed)
PHARMACY - PHYSICIAN COMMUNICATION CRITICAL VALUE ALERT - BLOOD CULTURE IDENTIFICATION (BCID)  Donna BLAUER is an 69 y.o. female who presented to [facility] on 04/02/2019 with a chief complaint of fever, altered mental status  Assessment:  69 year old woman admitted from home with sepsis.  1 of 4 blood cultures are positive for GPC.   Possible contamination - If more bottles turn positive, laboratory will perform BCID test  Name of physician (or Provider) Contacted: Hildred Laser PharmD who inform the medical team  Current antibiotics: Vancomycin + piperacillin/tazobactam  Changes to prescribed antibiotics recommended:  Patient is on recommended antibiotics - No changes needed   [name] 04/04/2019  8:23 AM

## 2019-04-04 NOTE — Progress Notes (Signed)
CRITICAL VALUE ALERT  Critical Value:  Potassium 3.3  Date & Time Notied:  04/04/19 0545  Provider Notified: Warren Lacy  Orders Received/Actions taken: Awaiting new orders

## 2019-04-04 NOTE — Progress Notes (Signed)
[place] for Coumadin Indication: atrial fibrillation  Allergies  Allergen Reactions  . Bee Venom Hives    Takes benadryl  . Other Other (See Comments)    Raw foods with seeds give her "boils".  Avoids raw strawberries, blueberries. Tolerates cooked fruits, tomato sauce, bread/grains with seeds. [facility] 10/17/13 Berries with seeds Allergy to glutamine-C-quercet-selen-brom per [facility] 09/25/17  . Gluten Meal Other (See Comments)    Per MAR  . Alteplase Rash  . Cefepime Rash  . Fentanyl Other (See Comments)    Hallucinations, syncope    . Metformin And Related Diarrhea    Patient Measurements: Height: 5\' 11"  (180.3 cm) Weight: 251 lb 15.8 oz (114.3 kg) IBW/kg (Calculated) : 70.8  Vital Signs: Temp: 100.5 F (38.1 C) (02/01 0700) Temp Source: Axillary (02/01 0700) BP: 104/57 (02/01 0900) Pulse Rate: 109 (02/01 0900)  Labs: Recent Labs    04/02/19 2100 04/02/19 2100 04/02/19 2112 04/02/19 2112 04/02/19 2142 04/02/19 2142 04/03/19 0401 04/04/19 0338  HGB 11.9*   < > 13.9   < > 13.6   < > 11.5* 10.9*  HCT 40.4   < > 41.0   < > 40.0  --  39.3 37.3  PLT 207  --   --   --   --   --  264 176  APTT 38*  --   --   --   --   --   --   --   LABPROT 24.4*  --   --   --   --   --  24.1* 20.3*  INR 2.2*  --   --   --   --   --  2.2* 1.8*  CREATININE 1.14*   < > 0.90  --   --   --  0.92 0.87   < > = values in this interval not displayed.    Estimated Creatinine Clearance: 86.2 mL/min (by C-G formula based on SCr of 0.87 mg/dL).   Medical History: Past Medical History:  Diagnosis Date  . CHF (congestive heart failure) ([place])   . Chronic /permanent atrial fibrillation ([place]) 2005   On Warfarin (follwed @ [facility]); Rate controlled - On BB  . Chronic diastolic heart failure, NYHA class 2 ([place])    Updated by A. fib, hypertensive heart disease and obesity; EDP was only 7 by cardiac catheterization in September 2017  .  Diabetic lumbosacral plexopathy ([place])   . Fibromyalgia   . Generalized anxiety disorder   . Hypokalemia 09/03/2017  . Incontinence   . Major depressive disorder   . OSA on CPAP   . Restless leg syndrome   . Syncope 09/03/2017  . Type II diabetes mellitus [facility])     Assessment: 69 yo female on chronic Coumadin for afib, admitted 1/30 with sepsis.  Pharmacy asked to resume anticoagulation with Coumadin (restarted 1/31).  INR 1.8 today.  PTA dose a bit confusing, I think normally she takes Coumadin 8 mg daily, but it seems pt had elevated INR Monday (4.4) she didn't take any Tuesday, 6 mg Wed, None Thurs, 7 mg Fri and supposed to take 8 Sat, but missed dose because was admitted to the hospital.  Goal of Therapy:  INR 2-3 Monitor platelets by anticoagulation protocol: Yes   Plan:  1. Coumadin 7.5 mg po x 1 tonight. 2. Daily PT/INR.  Hildred Laser, PharmD Clinical Pharmacist **Pharmacist phone directory can now be found on [email or web address] ([email or web address]).  Listed under  [facility].

## 2019-04-04 NOTE — Progress Notes (Signed)
[facility] ADULT ICU REPLACEMENT PROTOCOL FOR AM LAB REPLACEMENT ONLY  The patient does apply for the [facility] Adult ICU Electrolyte Replacment Protocol based on the criteria listed below:   1. Is GFR >/= 40 ml/min? Yes.    Patient's GFR today is >60 2. Is urine output >/= 0.5 ml/kg/hr for the last 6 hours? Yes.   Patient's UOP is 0.9 ml/kg/hr 3. Is BUN < 60 mg/dL? Yes.    Patient's BUN today is 10 4. Abnormal electrolyte(s): k 3.3 5. Ordered repletion with: protocol 6. If a panic level lab has been reported, has the CCM MD in charge been notified? No..   Physician:    Ronda Fairly A 04/04/2019 5:51 AM

## 2019-04-05 DIAGNOSIS — I509 Heart failure, unspecified: Secondary | ICD-10-CM

## 2019-04-05 DIAGNOSIS — L02212 Cutaneous abscess of back [any part, except buttock]: Secondary | ICD-10-CM

## 2019-04-05 DIAGNOSIS — E119 Type 2 diabetes mellitus without complications: Secondary | ICD-10-CM

## 2019-04-05 LAB — URINE CULTURE: Culture: >=100000 — AB

## 2019-04-05 LAB — BASIC METABOLIC PANEL
Anion gap: 10 (ref 5–15)
BUN: 11 mg/dL (ref 8–23)
CO2: 26 mmol/L (ref 22–32)
Calcium: 8.8 mg/dL — ABNORMAL LOW (ref 8.9–10.3)
Chloride: 100 mmol/L (ref 98–111)
Creatinine, Ser: 0.82 mg/dL (ref 0.44–1.00)
GFR calc Af Amer: >60 mL/min (ref >60)
GFR calc non Af Amer: >60 mL/min (ref >60)
Glucose, Bld: 149 mg/dL — ABNORMAL HIGH (ref 70–99)
Potassium: 3.4 mmol/L — ABNORMAL LOW (ref 3.5–5.1)
Sodium: 136 mmol/L (ref 135–145)

## 2019-04-05 LAB — GLUCOSE, CAPILLARY
Glucose-Capillary: 150 mg/dL — ABNORMAL HIGH (ref 70–99)
Glucose-Capillary: 152 mg/dL — ABNORMAL HIGH (ref 70–99)
Glucose-Capillary: 160 mg/dL — ABNORMAL HIGH (ref 70–99)
Glucose-Capillary: 129 mg/dL — ABNORMAL HIGH (ref 70–99)
Glucose-Capillary: 152 mg/dL — ABNORMAL HIGH (ref 70–99)

## 2019-04-05 LAB — SEDIMENTATION RATE: Sed Rate: 30 mm/hr — ABNORMAL HIGH (ref 0–22)

## 2019-04-05 LAB — C-REACTIVE PROTEIN: CRP: 11.8 mg/dL — ABNORMAL HIGH (ref <1.0)

## 2019-04-05 LAB — PROTIME-INR
INR: 1.8 — ABNORMAL HIGH (ref 0.8–1.2)
Prothrombin Time: 20.5 seconds — ABNORMAL HIGH (ref 11.4–15.2)

## 2019-04-05 LAB — MAGNESIUM: Magnesium: 1.5 mg/dL — ABNORMAL LOW (ref 1.7–2.4)

## 2019-04-05 MED ORDER — PHENYLEPHRINE HCL-NACL 10-0.9 MG/250ML-% IV SOLN
25.0000 ug/min | INTRAVENOUS | Status: DC
  Administered 2019-04-05: 25 ug/min via INTRAVENOUS
  Filled 2019-04-05 (×4): qty 250

## 2019-04-05 MED ORDER — POTASSIUM CHLORIDE CRYS ER 20 MEQ PO TBCR
40.0000 meq | EXTENDED_RELEASE_TABLET | Freq: Once | ORAL | Status: AC
  Administered 2019-04-05: 40 meq via ORAL
  Filled 2019-04-05: qty 2

## 2019-04-05 MED ORDER — MAGNESIUM SULFATE 2 GM/50ML IV SOLN
2.0000 g | Freq: Once | INTRAVENOUS | Status: AC
  Administered 2019-04-05: 2 g via INTRAVENOUS
  Filled 2019-04-05: qty 50

## 2019-04-05 MED ORDER — ALPRAZOLAM 0.25 MG PO TABS: 0.2500 mg | ORAL_TABLET | Freq: Three times a day (TID) | ORAL | Status: DC | PRN

## 2019-04-05 MED ORDER — WARFARIN SODIUM 7.5 MG PO TABS
7.5000 mg | ORAL_TABLET | Freq: Once | ORAL | Status: AC
  Administered 2019-04-05: 7.5 mg via ORAL
  Filled 2019-04-05: qty 1

## 2019-04-05 MED ORDER — SODIUM CHLORIDE 0.9 % IV SOLN
250.0000 mL | INTRAVENOUS | Status: DC
  Administered 2019-04-05: 04:00:00 250 mL via INTRAVENOUS

## 2019-04-05 NOTE — Progress Notes (Signed)
[facility] Progress Note Patient Name: Donna [name] DOB: April 14, 1950 MRN: [id number]   Date of Service  04/05/2019  HPI/Events of Note  K+ = 3.4, Mg++ = 1.5 and Creatinine = 0.82.   eICU Interventions  Will replace K+ and Mg++.      Intervention Category Major Interventions: Electrolyte abnormality - evaluation and management  Sommer,Steven Eugene 04/05/2019, 5:14 AM

## 2019-04-05 NOTE — Progress Notes (Signed)
[facility] for Coumadin Indication: atrial fibrillation  Allergies  Allergen Reactions  . Bee Venom Hives    Takes benadryl  . Other Other (See Comments)    Raw foods with seeds give her "boils".  Avoids raw strawberries, blueberries. Tolerates cooked fruits, tomato sauce, bread/grains with seeds. [facility] 10/17/13 Berries with seeds Allergy to glutamine-C-quercet-selen-brom per [facility] 09/25/17  . Gluten Meal Other (See Comments)    Per MAR  . Alteplase Rash  . Cefepime Rash  . Fentanyl Other (See Comments)    Hallucinations, syncope    . Metformin And Related Diarrhea    Patient Measurements: Height: 5\' 11"  (180.3 cm) Weight: 251 lb 12.3 oz (114.2 kg) IBW/kg (Calculated) : 70.8  Vital Signs: Temp: 99 F (37.2 C) (02/02 1124) Temp Source: Oral (02/02 1124) BP: 114/68 (02/02 1315) Pulse Rate: 103 (02/02 1315)  Labs: Recent Labs    04/02/19 2100 04/02/19 2100 04/02/19 2112 04/02/19 2142 04/03/19 0401 04/04/19 0338 04/05/19 0240 04/05/19 1401  HGB 11.9*   < >   < > 13.6 11.5* 10.9*  --   --   HCT 40.4  --    < > 40.0 39.3 37.3  --   --   PLT 207  --   --   --  264 176  --   --   APTT 38*  --   --   --   --   --   --   --   LABPROT 24.4*   < >  --   --  24.1* 20.3*  --  20.5*  INR 2.2*   < >  --   --  2.2* 1.8*  --  1.8*  CREATININE 1.14*   < >   < >  --  0.92 0.87 0.82  --    < > = values in this interval not displayed.    Estimated Creatinine Clearance: 91.4 mL/min (by C-G formula based on SCr of 0.82 mg/dL).   Medical History: Past Medical History:  Diagnosis Date  . CHF (congestive heart failure) ([place])   . Chronic /permanent atrial fibrillation ([place]) 2005   On Warfarin (follwed @ [facility]); Rate controlled - On BB  . Chronic diastolic heart failure, NYHA class 2 ([place])    Updated by A. fib, hypertensive heart disease and obesity; EDP was only 7 by cardiac catheterization in September 2017  . Diabetic  lumbosacral plexopathy ([place])   . Fibromyalgia   . Generalized anxiety disorder   . Hypokalemia 09/03/2017  . Incontinence   . Major depressive disorder   . OSA on CPAP   . Restless leg syndrome   . Syncope 09/03/2017  . Type II diabetes mellitus [facility])     Assessment: 69 yo female on chronic Coumadin for afib, admitted 1/30 with sepsis.  Pharmacy asked to resume anticoagulation with Coumadin (restarted 1/31).  INR 1.8 today.  PTA dose a bit confusing, I think normally she takes Coumadin 8 mg daily, but it seems pt had elevated INR Monday (4.4) she didn't take any Tuesday, 6 mg Wed, None Thurs, 7 mg Fri and supposed to take 8 Sat, but missed dose because was admitted to the hospital.  Goal of Therapy:  INR 2-3 Monitor platelets by anticoagulation protocol: Yes   Plan:  1. Coumadin 7.5 mg po x 1 tonight (will increase dose 2/3 if no further INR increase) 2. Daily PT/INR.  Hildred Laser, PharmD Clinical Pharmacist **Pharmacist phone directory can  now be found on [email or web address] ([email or web address]).  Listed under [place].

## 2019-04-05 NOTE — Progress Notes (Signed)
Follow up - Critical Care Medicine Note  Patient Details:    Donna [name] is an 69 y.o. female.  Lines, Airways, Drains: External Urinary Catheter (Active)  Collection Container Dedicated Suction Canister 04/04/19 2000  Securement Method Tape 04/04/19 2000  Site Assessment Clean;Intact 04/04/19 2000  Intervention Equipment Changed 04/04/19 2000  Output (mL) 200 mL 04/05/19 0600    Anti-infectives:  Anti-infectives (From admission, onward)    Start     Dose/Rate Route Frequency Ordered Stop   04/03/19 1000  vancomycin (VANCOCIN) IVPB 1000 mg/200 mL premix     1,000 mg 200 mL/hr over 60 Minutes Intravenous Every 12 hours 04/03/19 0037     04/03/19 0600  aztreonam (AZACTAM) 2 g in sodium chloride 0.9 % 100 mL IVPB  Status:  Discontinued     2 g 200 mL/hr over 30 Minutes Intravenous Every 8 hours 04/03/19 0037 04/03/19 0418   04/03/19 0600  piperacillin-tazobactam (ZOSYN) IVPB 3.375 g     3.375 g 12.5 mL/hr over 240 Minutes Intravenous Every 8 hours 04/03/19 0418     04/02/19 2145  metroNIDAZOLE (FLAGYL) IVPB 500 mg     500 mg 100 mL/hr over 60 Minutes Intravenous  Once 04/02/19 2142 04/02/19 2300   04/02/19 2145  vancomycin (VANCOCIN) IVPB 1000 mg/200 mL premix     1,000 mg 200 mL/hr over 60 Minutes Intravenous  Once 04/02/19 2142 04/02/19 2300   04/02/19 2130  aztreonam (AZACTAM) 2 g in sodium chloride 0.9 % 100 mL IVPB     2 g 200 mL/hr over 30 Minutes Intravenous  Once 04/02/19 2142 04/02/19 2300       Microbiology: Results for orders placed or performed during the hospital encounter of 04/02/19  Blood culture (routine x 2)     Status: None (Preliminary result)   Collection Time: 04/02/19  9:35 PM   Specimen: BLOOD  Result Value Ref Range Status   Specimen Description BLOOD RIGHT ARM  Final   Special Requests   Final    BOTTLES DRAWN AEROBIC AND ANAEROBIC Blood Culture adequate volume Performed at [facility], [place] [address] [place], [place] [address]     Culture  Setup Time   Final    GRAM POSITIVE COCCI IN CLUSTERS AEROBIC BOTTLE ONLY CRITICAL RESULT CALLED TO, READ BACK BY AND VERIFIED WITH: PHARMD JEREMY F. [id number]     Culture GRAM POSITIVE COCCI  Final   Report Status PENDING  Incomplete  Blood culture (routine x 2)     Status: None (Preliminary result)   Collection Time: 04/02/19  9:35 PM   Specimen: BLOOD  Result Value Ref Range Status   Specimen Description BLOOD RIGHT ARM  Final   Special Requests   Final    BOTTLES DRAWN AEROBIC AND ANAEROBIC Blood Culture results may not be optimal due to an inadequate volume of blood received in culture bottles Performed at [facility], [address], [place], [place] [address]    Culture NO GROWTH 3 DAYS  Final   Report Status PENDING  Incomplete  Respiratory Panel by RT PCR (Flu A&B, Covid) - Nasopharyngeal Swab     Status: None   Collection Time: 04/02/19 11:36 PM   Specimen: Nasopharyngeal Swab  Result Value Ref Range Status   SARS Coronavirus 2 by RT PCR NEGATIVE NEGATIVE Final    Comment: (NOTE) SARS-CoV-2 target nucleic acids are NOT DETECTED. The SARS-CoV-2 RNA is generally detectable in upper respiratoy specimens during the acute phase of  infection. The lowest concentration of SARS-CoV-2 viral copies this assay can detect is 131 copies/mL. A negative result does not preclude SARS-Cov-2 infection and should not be used as the sole basis for treatment or other patient management decisions. A negative result may occur with  improper specimen collection/handling, submission of specimen other than nasopharyngeal swab, presence of viral mutation(s) within the areas targeted by this assay, and inadequate number of viral copies (<131 copies/mL). A negative result must be combined with clinical observations, patient history, and epidemiological information. The expected result is Negative. Fact Sheet for Patients:  PinkCheek.be Fact Sheet for  Healthcare Providers:  GravelBags.it This test is not yet ap proved or cleared by the [place] FDA and  has been authorized for detection and/or diagnosis of SARS-CoV-2 by FDA under an Emergency Use Authorization (EUA). This EUA will remain  in effect (meaning this test can be used) for the duration of the COVID-19 declaration under Section 564(b)(1) of the Act, 21 U.S.C. section 360bbb-3(b)(1), unless the authorization is terminated or revoked sooner.    Influenza A by PCR NEGATIVE NEGATIVE Final   Influenza B by PCR NEGATIVE NEGATIVE Final    Comment: (NOTE) The Xpert Xpress SARS-CoV-2/FLU/RSV assay is intended as an aid in  the diagnosis of influenza from Nasopharyngeal swab specimens and  should not be used as a sole basis for treatment. Nasal washings and  aspirates are unacceptable for Xpert Xpress SARS-CoV-2/FLU/RSV  testing. Fact Sheet for Patients: PinkCheek.be Fact Sheet for Healthcare Providers: GravelBags.it This test is not yet approved or cleared by the [place] FDA and  has been authorized for detection and/or diagnosis of SARS-CoV-2 by  FDA under an Emergency Use Authorization (EUA). This EUA will remain  in effect (meaning this test can be used) for the duration of the  Covid-19 declaration under Section 564(b)(1) of the Act, 21  U.S.C. section 360bbb-3(b)(1), unless the authorization is  terminated or revoked. Performed at [facility], [place] [address] [place], [place] [address]   Urine culture     Status: Abnormal   Collection Time: 04/02/19 11:43 PM   Specimen: In/Out Cath Urine  Result Value Ref Range Status   Specimen Description IN/OUT CATH URINE  Final   Special Requests STERILE CONTAINER  Final   Culture (A)  Final    >=100,000 COLONIES/mL KLEBSIELLA PNEUMONIAE 10,000 COLONIES/mL PSEUDOMONAS AERUGINOSA Confirmed Extended Spectrum Beta-Lactamase  Producer (ESBL).  In bloodstream infections from ESBL organisms, carbapenems are preferred over piperacillin/tazobactam. They are shown to have a lower risk of mortality. FOR KLEBSIELLA PNEUMONIAE Performed at [facility], [place] [address], [place], [place] [address]    Report Status 04/05/2019 FINAL  Final   Organism ID, Bacteria KLEBSIELLA PNEUMONIAE (A)  Final   Organism ID, Bacteria PSEUDOMONAS AERUGINOSA (A)  Final      Susceptibility   Klebsiella pneumoniae - MIC*    AMPICILLIN >=32 RESISTANT Resistant     CEFAZOLIN >=64 RESISTANT Resistant     CEFTRIAXONE >=64 RESISTANT Resistant     CIPROFLOXACIN 1 SENSITIVE Sensitive     GENTAMICIN >=16 RESISTANT Resistant     IMIPENEM 0.5 SENSITIVE Sensitive     NITROFURANTOIN 64 INTERMEDIATE Intermediate     TRIMETH/SULFA >=320 RESISTANT Resistant     AMPICILLIN/SULBACTAM >=32 RESISTANT Resistant     PIP/TAZO 16 SENSITIVE Sensitive     * >=100,000 COLONIES/mL KLEBSIELLA PNEUMONIAE   Pseudomonas aeruginosa - MIC*    CEFTAZIDIME 8 SENSITIVE Sensitive     CIPROFLOXACIN  0.5 SENSITIVE Sensitive     GENTAMICIN >=16 RESISTANT Resistant     IMIPENEM 2 SENSITIVE Sensitive     PIP/TAZO 16 SENSITIVE Sensitive     CEFEPIME 4 SENSITIVE Sensitive     * 10,000 COLONIES/mL PSEUDOMONAS AERUGINOSA  MRSA PCR Screening     Status: None   Collection Time: 04/03/19  6:01 AM   Specimen: Nasopharyngeal  Result Value Ref Range Status   MRSA by PCR NEGATIVE NEGATIVE Final    Comment:        The GeneXpert MRSA Assay (FDA approved for NASAL specimens only), is one component of a comprehensive MRSA colonization surveillance program. It is not intended to diagnose MRSA infection nor to guide or monitor treatment for MRSA infections. Performed at [facility], [place] [address], [place], [place] [address]     Best Practice/Protocols:  VTE Prophylaxis: Mechanical   Events:   Studies: CT ABDOMEN PELVIS W CONTRAST  Result Date:  04/03/2019 IMPRESSION: 1. 3.4 x 1.2 cm rim enhancing fluid collection superficial to the sacrum is suspicious for abscess. Punctate foci is of adjacent gas could be seen with decubitus ulceration but more aggressive soft tissue infection should be excluded on a clinical basis. 2. Indeterminate presacral lesion, increasing in size since 2019. Could be related to the adjacent fracture of the sacrococcygeal junction though overall remains indeterminate. 3. Bladder wall thickening with some urothelial thickening particular on the right concerning for potential ascending urinary tract infection. No visualized urolithiasis though distal evaluation is limited by streak from right hip arthroplasty. 4. Circumferential body wall edema. Correlate for features of anasarca. 5.  Aortic Atherosclerosis (ICD10-I70.0). And Critical Value/emergent results were called by telephone at the time of interpretation on 04/03/2019 at 1:45 am to provider [facility] , who verbally acknowledged these results. Electronically Signed   By: Lovena Le M.D.   On: 04/03/2019 01:45   DG Chest Port 1 View  Result Date: 04/02/2019  IMPRESSION: No active disease. Electronically Signed   By: Constance Holster M.D.   On: 04/02/2019 22:37   CT HEAD CODE STROKE WO CONTRAST  Result Date: 04/02/2019 IMPRESSION: Stable since 2019 and largely negative for age noncontrast CT appearance of the brain. ASPECTS 10. These results were communicated to Dr. Lorraine Lax at 9:16 pm on 1/30/2021by text page via the [facility] messaging system. Electronically Signed   By: Genevie Ann M.D.   On: 04/02/2019 21:17   [place] EKG SITE RITE  Result Date: 04/04/2019 If Site Rite image not attached, placement could not be confirmed due to current cardiac rhythm.   Consults:     Subjective:    Overnight Issues:  Patient had hypotensive episode with BP 106/38 and MAP 53. Norepi was given, pt became tachycardic. Started phenylephrine IV drip. Found to have K of 3.4 and Mg 1.5  (Cre 0.82) and began repletion.  Objective:  Vital signs for last 24 hours: Temp:  [98.3 F (36.8 C)-100.1 F (37.8 C)] 98.5 F (36.9 C) (02/02 0400) Pulse Rate:  [66-123] 87 (02/02 0636) Resp:  [11-32] 19 (02/02 0645) BP: (60-160)/(25-119) 62/53 (02/02 0645) SpO2:  [90 %-100 %] 96 % (02/02 0636) Weight:  [114.2 kg] 114.2 kg (02/02 0500)  Hemodynamic parameters for last 24 hours:    Intake/Output from previous day: 02/01 0701 - 02/02 0700 In: 934.9 [P.O.:150; I.V.:212.1; IV Piggyback:572.7] Out: 1550 [Urine:1550]  Intake/Output this shift: No intake/output data recorded.  Vent settings for last 24 hours:    Physical Exam:  General:  Laying in bed on her back, no distress, comfortable HENT: Oropharynx clear, pupils equal Lungs: Clear bilaterally except fine cracks RUL anteriorly, normal work and rate of breathing Cardiovascular: Atrial fibrillation, good rate control, soft systolic murmur Abdomen: Obese, soft, nontender, nondistended, positive bowel sounds Extremities: 1+ pretibial edema, SCDs in place Neuro: Awake, alert, interacting appropriately, moves all extremities SKIN: Warm, dry, intact, except, there is a 3.5 x 1.5 x 0.5 cm sacral wound with healthy granulation tissue at the base, no drainage  Assessment/Plan:   69 year old female presenting with septic shock and possible sacral abscess in the setting of a chronic sacral wound and possible UTI.   #Septic shock #Chronic sacral decubitus wound #Sacral fluid collection identified on CT #Possible UTI.  UA with large LE but nitrite negative Had been weaned successfully from pressors, however had hypotensive episode overnight after getting metoprolol and alprazolam. MAP recovered on phenylephrine this AM and she has now been weaned off. ID saw patient and rec sacral MRI to evaluate for possible osteomyelitis, as well as to better evaluate look at pre-sacral mass. If patient BP remains stable, can consider moving out of  the ICU to medical floor.  -Hold metoprolol -Consider sacral spine MRI to evaluate for osteomyelitis -Consider moving from ICU to medical floor   Chronic issues: #Chronic A. Fib: On Coumadin at home, INR therapeutic at 2.2.  Currently rate controlled on metoprolol.  -Continue to hold warfarin, if INR trends below 2, will start heparin drip for possible procedure.   #Depression/anxiety: Continue home Abilify, Zoloft, Buspar. Switch Xanax to prn   #Restless leg syndrome: Continue pramipexole   #Diabetes: On Lantus 40 at home -Sliding-scale insulin -Lantus decreased to 25 units for now -Probably start a diet if no planned procedures  LOS: 2 days Additional comments:  Critical Care Total Time*: 15 minutes  Buena Irish 04/05/2019  *Care during the described time interval was provided by me and/or other providers on the critical care team.  I have reviewed this patient's available data, including medical history, events of note, physical examination and test results as part of my evaluation.

## 2019-04-05 NOTE — Progress Notes (Signed)
Subjective: No new complaints   Antibiotics:  Anti-infectives (From admission, onward)   Start     Dose/Rate Route Frequency Ordered Stop   04/03/19 1000  vancomycin (VANCOCIN) IVPB 1000 mg/200 mL premix     1,000 mg 200 mL/hr over 60 Minutes Intravenous Every 12 hours 04/03/19 0037     04/03/19 0600  aztreonam (AZACTAM) 2 g in sodium chloride 0.9 % 100 mL IVPB  Status:  Discontinued     2 g 200 mL/hr over 30 Minutes Intravenous Every 8 hours 04/03/19 0037 04/03/19 0418   04/03/19 0600  piperacillin-tazobactam (ZOSYN) IVPB 3.375 g     3.375 g 12.5 mL/hr over 240 Minutes Intravenous Every 8 hours 04/03/19 0418     04/02/19 2145  metroNIDAZOLE (FLAGYL) IVPB 500 mg     500 mg 100 mL/hr over 60 Minutes Intravenous  Once 04/02/19 2142 04/02/19 2300   04/02/19 2145  vancomycin (VANCOCIN) IVPB 1000 mg/200 mL premix     1,000 mg 200 mL/hr over 60 Minutes Intravenous  Once 04/02/19 2142 04/02/19 2300   04/02/19 2130  aztreonam (AZACTAM) 2 g in sodium chloride 0.9 % 100 mL IVPB     2 g 200 mL/hr over 30 Minutes Intravenous  Once 04/02/19 2142 04/02/19 2300      Medications: Scheduled Meds: . ARIPiprazole  10 mg Oral Daily  . busPIRone  10 mg Oral BID  . chlorhexidine  15 mL Mouth Rinse BID  . Chlorhexidine Gluconate Cloth  6 each Topical Daily  . ezetimibe  10 mg Oral QHS  . gabapentin  300 mg Oral TID  . insulin aspart  0-15 Units Subcutaneous TID WC  . insulin aspart  0-5 Units Subcutaneous QHS  . insulin glargine  25 Units Subcutaneous QHS  . mouth rinse  15 mL Mouth Rinse q12n4p  . mouth rinse  15 mL Mouth Rinse BID  . metoprolol tartrate  12.5 mg Oral BID  . pantoprazole  40 mg Oral Daily  . potassium chloride  40 mEq Oral Once  . pramipexole  0.125 mg Oral QHS  . sertraline  100 mg Oral QHS  . Warfarin - Pharmacist Dosing Inpatient   Does not apply q1800   Continuous Infusions: . sodium chloride Stopped (04/05/19 0528)  . phenylephrine (NEO-SYNEPHRINE)  Adult infusion Stopped (04/05/19 1126)  . piperacillin-tazobactam (ZOSYN)  IV Stopped (04/05/19 1059)  . vancomycin 200 mL/hr at 04/05/19 1200   PRN Meds:.ALPRAZolam, oxyCODONE    Objective: Weight change: -0.1 kg  Intake/Output Summary (Last 24 hours) at 04/05/2019 1253 Last data filed at 04/05/2019 1200 Gross per 24 hour  Intake 1794.3 ml  Output 1550 ml  Net 244.3 ml   Blood pressure (!) 89/62, pulse 90, temperature 99 F (37.2 C), temperature source Oral, resp. rate 15, height 5\' 11"  (1.803 m), weight 114.2 kg, SpO2 98 %. Temp:  [98.3 F (36.8 C)-100.1 F (37.8 C)] 99 F (37.2 C) (02/02 1124) Pulse Rate:  [59-103] 90 (02/02 1230) Resp:  [11-32] 15 (02/02 1230) BP: (60-160)/(25-119) 89/62 (02/02 1230) SpO2:  [91 %-100 %] 98 % (02/02 1230) Weight:  [114.2 kg] 114.2 kg (02/02 0500)  Physical Exam: General: Alert and awake, oriented x3, not in any acute distress. HEENT: anicteric sclera, EOMI CVS tachycardic rate no murmurs gallops rubs heard Chest: , Clear to auscultation anteriorly Abdomen: soft non-distended,  Skin: no rashes Neuro: nonfocal  CBC:    BMET Recent Labs    04/04/19 0338 04/05/19  0240  NA 135 136  K 3.3* 3.4*  CL 97* 100  CO2 26 26  GLUCOSE 152* 149*  BUN 10 11  CREATININE 0.87 0.82  CALCIUM 9.1 8.8*     Liver Panel  Recent Labs    04/02/19 2100  PROT 7.4  ALBUMIN 3.7  AST 26  ALT 9  ALKPHOS 81  BILITOT 1.5*       Sedimentation Rate Recent Labs    04/05/19 0240  ESRSEDRATE 30*   C-Reactive Protein Recent Labs    04/05/19 0240  CRP 11.8*    Micro Results: Recent Results (from the past 720 hour(s))  Blood culture (routine x 2)     Status: Abnormal (Preliminary result)   Collection Time: 04/02/19  9:35 PM   Specimen: BLOOD  Result Value Ref Range Status   Specimen Description BLOOD RIGHT ARM  Final   Special Requests   Final    BOTTLES DRAWN AEROBIC AND ANAEROBIC Blood Culture adequate volume Performed at [facility], [place] [address] [place], [place] [address]    Culture  Setup Time   Final    GRAM POSITIVE COCCI IN CLUSTERS AEROBIC BOTTLE ONLY CRITICAL RESULT CALLED TO, READ BACK BY AND VERIFIED WITH: PHARMD JEREMY F. [id number]     Culture (A)  Final    MICROCOCCUS LUTEUS/LYLAE THE SIGNIFICANCE OF ISOLATING THIS ORGANISM FROM A SINGLE SET OF BLOOD CULTURES WHEN MULTIPLE SETS ARE DRAWN IS UNCERTAIN. PLEASE NOTIFY THE MICROBIOLOGY DEPARTMENT WITHIN ONE WEEK IF SPECIATION AND SENSITIVITIES ARE REQUIRED.    Report Status PENDING  Incomplete  Blood culture (routine x 2)     Status: None (Preliminary result)   Collection Time: 04/02/19  9:35 PM   Specimen: BLOOD  Result Value Ref Range Status   Specimen Description BLOOD RIGHT ARM  Final   Special Requests   Final    BOTTLES DRAWN AEROBIC AND ANAEROBIC Blood Culture results may not be optimal due to an inadequate volume of blood received in culture bottles Performed at [facility], [place] [address], [place], [place] [address]    Culture NO GROWTH 3 DAYS  Final   Report Status PENDING  Incomplete  Respiratory Panel by RT PCR (Flu A&B, Covid) - Nasopharyngeal Swab     Status: None   Collection Time: 04/02/19 11:36 PM   Specimen: Nasopharyngeal Swab  Result Value Ref Range Status   SARS Coronavirus 2 by RT PCR NEGATIVE NEGATIVE Final    Comment: (NOTE) SARS-CoV-2 target nucleic acids are NOT DETECTED. The SARS-CoV-2 RNA is generally detectable in upper respiratoy specimens during the acute phase of infection. The lowest concentration of SARS-CoV-2 viral copies this assay can detect is 131 copies/mL. A negative result does not preclude SARS-Cov-2 infection and should not be used as the sole basis for treatment or other patient management decisions. A negative result may occur with  improper specimen collection/handling, submission of specimen other than nasopharyngeal swab, presence of viral mutation(s) within the areas targeted by  this assay, and inadequate number of viral copies (<131 copies/mL). A negative result must be combined with clinical observations, patient history, and epidemiological information. The expected result is Negative. Fact Sheet for Patients:  PinkCheek.be Fact Sheet for Healthcare Providers:  GravelBags.it This test is not yet ap proved or cleared by the [place] FDA and  has been authorized for detection and/or diagnosis of SARS-CoV-2 by FDA under an Emergency Use Authorization (EUA). This EUA will remain  in effect (meaning  this test can be used) for the duration of the COVID-19 declaration under Section 564(b)(1) of the Act, 21 U.S.C. section 360bbb-3(b)(1), unless the authorization is terminated or revoked sooner.    Influenza A by PCR NEGATIVE NEGATIVE Final   Influenza B by PCR NEGATIVE NEGATIVE Final    Comment: (NOTE) The Xpert Xpress SARS-CoV-2/FLU/RSV assay is intended as an aid in  the diagnosis of influenza from Nasopharyngeal swab specimens and  should not be used as a sole basis for treatment. Nasal washings and  aspirates are unacceptable for Xpert Xpress SARS-CoV-2/FLU/RSV  testing. Fact Sheet for Patients: PinkCheek.be Fact Sheet for Healthcare Providers: GravelBags.it This test is not yet approved or cleared by the [place] FDA and  has been authorized for detection and/or diagnosis of SARS-CoV-2 by  FDA under an Emergency Use Authorization (EUA). This EUA will remain  in effect (meaning this test can be used) for the duration of the  Covid-19 declaration under Section 564(b)(1) of the Act, 21  U.S.C. section 360bbb-3(b)(1), unless the authorization is  terminated or revoked. Performed at [facility], [place] [address], [place], [place] [address]   Urine culture     Status: Abnormal   Collection Time: 04/02/19 11:43 PM   Specimen:  In/Out Cath Urine  Result Value Ref Range Status   Specimen Description IN/OUT CATH URINE  Final   Special Requests STERILE CONTAINER  Final   Culture (A)  Final    >=100,000 COLONIES/mL KLEBSIELLA PNEUMONIAE 10,000 COLONIES/mL PSEUDOMONAS AERUGINOSA Confirmed Extended Spectrum Beta-Lactamase Producer (ESBL).  In bloodstream infections from ESBL organisms, carbapenems are preferred over piperacillin/tazobactam. They are shown to have a lower risk of mortality. FOR KLEBSIELLA PNEUMONIAE Performed at [facility], [place] [address], [place], [place] [address]    Report Status 04/05/2019 FINAL  Final   Organism ID, Bacteria KLEBSIELLA PNEUMONIAE (A)  Final   Organism ID, Bacteria PSEUDOMONAS AERUGINOSA (A)  Final      Susceptibility   Klebsiella pneumoniae - MIC*    AMPICILLIN >=32 RESISTANT Resistant     CEFAZOLIN >=64 RESISTANT Resistant     CEFTRIAXONE >=64 RESISTANT Resistant     CIPROFLOXACIN 1 SENSITIVE Sensitive     GENTAMICIN >=16 RESISTANT Resistant     IMIPENEM 0.5 SENSITIVE Sensitive     NITROFURANTOIN 64 INTERMEDIATE Intermediate     TRIMETH/SULFA >=320 RESISTANT Resistant     AMPICILLIN/SULBACTAM >=32 RESISTANT Resistant     PIP/TAZO 16 SENSITIVE Sensitive     * >=100,000 COLONIES/mL KLEBSIELLA PNEUMONIAE   Pseudomonas aeruginosa - MIC*    CEFTAZIDIME 8 SENSITIVE Sensitive     CIPROFLOXACIN 0.5 SENSITIVE Sensitive     GENTAMICIN >=16 RESISTANT Resistant     IMIPENEM 2 SENSITIVE Sensitive     PIP/TAZO 16 SENSITIVE Sensitive     CEFEPIME 4 SENSITIVE Sensitive     * 10,000 COLONIES/mL PSEUDOMONAS AERUGINOSA  MRSA PCR Screening     Status: None   Collection Time: 04/03/19  6:01 AM   Specimen: Nasopharyngeal  Result Value Ref Range Status   MRSA by PCR NEGATIVE NEGATIVE Final    Comment:        The GeneXpert MRSA Assay (FDA approved for NASAL specimens only), is one component of a comprehensive MRSA colonization surveillance program. It is not intended to  diagnose MRSA infection nor to guide or monitor treatment for MRSA infections. Performed at [facility], [place] [address] [place], [place] [address]     Studies/Results: [place] EKG  SITE RITE  Result Date: 04/04/2019 If Site Rite image not attached, placement could not be confirmed due to current cardiac rhythm.     Assessment/Plan:  INTERVAL HISTORY: IR and surgery do not feel that the area where the abscesses is something they can approach.     Principal Problem:   Abscess of back Active Problems:   Sepsis ([place])   Pressure injury of skin    Donna [name] is a 69 y.o. female with 69 y.o. female with multiple medical problems including atrial fibrillation on Coumadin, obstructive sleep apnea heart failure diabetes mellitus with a chronic sacral decubitus ulcer.  She was admitted to the ICU in septic shock.  Blood cultures were taken and she was initiated on broad-spectrum antibiotics in the form of vancomycin and Zosyn.  S.  She has a chronic sacral decubitus ulcer that was evaluated by general surgery.  She is in the interim had a CT scan performed which shows a 3.4 x 1.2 cm rim-enhancing fluid collection that is superficial and near the sacrum concerning for abscess with potential gas.  His grown a Micrococcus luteus from 1 of 2 blood cultures which is likely a contaminant and is grown Pseudomonas and Klebsiella pneumonia from her urine.  1.  Abscess near the sacrum and abnormalities in the presacral area:  --I am ordering an MRI of her sacrum to further delineate this process and help [place] understand if this might be osteomyelitis plus an abscess or other it is a fracture and an abscess  2 sepsis: Not clear if this is from her process in her sacrum.  Her blood cultures showing a likely contaminant she is growing multiple organisms in the urine but did not come in with much in the way of urinary complaints that I can tell.  For now we will continue on her current antimicrobials  and will need to the MRI to help [place] understand she will need protracted antibiotics   LOS: 2 days   Alcide Evener 04/05/2019, 12:53 PM

## 2019-04-05 NOTE — Progress Notes (Signed)
eLink Physician-Brief Progress Note Patient Name: Donna [name] DOB: May 04, 1950 MRN: [id number]   Date of Service  04/05/2019  HPI/Events of Note  Hypotension - BP = 106/38 with MAP = 53. Norepinephrine IV infusion ordered. However, noted to make patient tachycardic.   eICU Interventions  Will order:  1. D/C Norepinephrine IV infusion.  2. Phenylephrine IV infusion via a peripheral IV. Titrate to  MAP  >= 65.      Intervention Category Major Interventions: Hypotension - evaluation and management  Margaret Cockerill [place] 04/05/2019, 3:35 AM

## 2019-04-06 ENCOUNTER — Inpatient Hospital Stay (HOSPITAL_COMMUNITY): Payer: Medicare Other

## 2019-04-06 ENCOUNTER — Encounter (HOSPITAL_COMMUNITY): Payer: Self-pay | Admitting: *Deleted

## 2019-04-06 DIAGNOSIS — Z95828 Presence of other vascular implants and grafts: Secondary | ICD-10-CM

## 2019-04-06 LAB — BASIC METABOLIC PANEL
Anion gap: 10 (ref 5–15)
BUN: 11 mg/dL (ref 8–23)
CO2: 26 mmol/L (ref 22–32)
Calcium: 8.8 mg/dL — ABNORMAL LOW (ref 8.9–10.3)
Chloride: 100 mmol/L (ref 98–111)
Creatinine, Ser: 0.8 mg/dL (ref 0.44–1.00)
GFR calc Af Amer: >60 mL/min (ref >60)
GFR calc non Af Amer: >60 mL/min (ref >60)
Glucose, Bld: 162 mg/dL — ABNORMAL HIGH (ref 70–99)
Potassium: 3.7 mmol/L (ref 3.5–5.1)
Sodium: 136 mmol/L (ref 135–145)

## 2019-04-06 LAB — VANCOMYCIN, PEAK: Vancomycin Pk: 28 ug/mL — ABNORMAL LOW (ref 30–40)

## 2019-04-06 LAB — VANCOMYCIN, TROUGH: Vancomycin Tr: 18 ug/mL (ref 15–20)

## 2019-04-06 LAB — GLUCOSE, CAPILLARY
Glucose-Capillary: 177 mg/dL — ABNORMAL HIGH (ref 70–99)
Glucose-Capillary: 118 mg/dL — ABNORMAL HIGH (ref 70–99)
Glucose-Capillary: 145 mg/dL — ABNORMAL HIGH (ref 70–99)

## 2019-04-06 LAB — MAGNESIUM: Magnesium: 1.7 mg/dL (ref 1.7–2.4)

## 2019-04-06 LAB — CULTURE, BLOOD (ROUTINE X 2): Special Requests: ADEQUATE

## 2019-04-06 LAB — PROTIME-INR
INR: 1.9 — ABNORMAL HIGH (ref 0.8–1.2)
Prothrombin Time: 21.5 seconds — ABNORMAL HIGH (ref 11.4–15.2)

## 2019-04-06 MED ORDER — GADOBUTROL 1 MMOL/ML IV SOLN
10.0000 mL | Freq: Once | INTRAVENOUS | Status: AC | PRN
  Administered 2019-04-06: 10 mL via INTRAVENOUS

## 2019-04-06 MED ORDER — SODIUM CHLORIDE 0.9% FLUSH: 10.0000 mL | INTRAVENOUS | Status: DC | PRN

## 2019-04-06 MED ORDER — WARFARIN SODIUM 7.5 MG PO TABS
7.5000 mg | ORAL_TABLET | Freq: Once | ORAL | Status: AC
  Administered 2019-04-06: 17:00:00 7.5 mg via ORAL
  Filled 2019-04-06: qty 1

## 2019-04-06 MED ORDER — METOPROLOL TARTRATE 25 MG PO TABS
25.0000 mg | ORAL_TABLET | Freq: Two times a day (BID) | ORAL | Status: DC
  Administered 2019-04-06 – 2019-04-07 (×2): 25 mg via ORAL
  Filled 2019-04-06 (×2): qty 1

## 2019-04-06 MED ORDER — SODIUM CHLORIDE 0.9% FLUSH
10.0000 mL | Freq: Two times a day (BID) | INTRAVENOUS | Status: DC
  Administered 2019-04-06: 10 mL

## 2019-04-06 NOTE — Progress Notes (Signed)
Pharmacy Antibiotic Note  Donna [name] is a 69 y.o. female with septic shock on vancomycin. She is noted with a sacral abscess and the MRI was noted with osteo. ID is also following -WBC WNL, SCr= 0.8 -urine culture with kleb Pn (sens to zosyn) and pseudomonas (likely colonizer) -vancomycin peak= 28, trough= 18; calculated AUC= 528   Plan: -Continue Vancomycin 1000 mg IV q12h -Zosyn 3.375gm IV 8h -Will follow renal function, cultures and clinical progress    Height: 5\' 11"  (180.3 cm) Weight: 248 lb 7.3 oz (112.7 kg) IBW/kg (Calculated) : 70.8  Temp (24hrs), Avg:98.8 F (37.1 C), Min:98.2 F (36.8 C), Max:99.4 F (37.4 C)  Recent Labs  Lab 04/02/19 2100 04/02/19 2100 04/02/19 2112 04/02/19 2348 04/03/19 0401 04/04/19 0338 04/05/19 0240 04/06/19 0053 04/06/19 0838  WBC 12.5*  --   --   --  17.0* 7.1  --   --   --   CREATININE 1.14*   < > 0.90  --  0.92 0.87 0.82 0.80  --   LATICACIDVEN 2.4*  --   --  1.9  --   --   --   --   --   VANCOTROUGH  --   --   --   --   --   --   --   --  18  VANCOPEAK  --   --   --   --   --   --   --  28*  --    < > = values in this interval not displayed.    Estimated Creatinine Clearance: 93.1 mL/min (by C-G formula based on SCr of 0.8 mg/dL).    Allergies  Allergen Reactions  . Bee Venom Hives    Takes benadryl  . Other Other (See Comments)    Raw foods with seeds give her "boils".  Avoids raw strawberries, blueberries. Tolerates cooked fruits, tomato sauce, bread/grains with seeds. [facility] 10/17/13 Berries with seeds Allergy to glutamine-C-quercet-selen-brom per [facility] 09/25/17  . Gluten Meal Other (See Comments)    Per MAR  . Alteplase Rash  . Cefepime Rash  . Fentanyl Other (See Comments)    Hallucinations, syncope    . Metformin And Related Diarrhea   Antimicrobials this admission:  Vancomycin 1/31 > Zosyn 1/31>   Dose adjustments this admission:  2/2 VP= 28  2/3 VT= 18; AUC= 528 (vanc 1000mg  IV q12h)  Microbiology  results:  2/2 blood x2 1/31 MRSA PCR - neg 1/30 Flu/COVID - neg 1/30 UCx >  GNR > kleb Pn- sens to cipro, zosyn; pseudomonas (likely colonizer; sens to zosyn) 1/30 BCx x 2 > micrococcus 1/2 (likely a contaminant)  Hildred Laser, PharmD Clinical Pharmacist **Pharmacist phone directory can now be found on [email or web address] ([email or web address]).  Listed under [place].

## 2019-04-06 NOTE — Progress Notes (Signed)
Follow up - Critical Care Medicine Note  Patient Details:    Donna [name] is an 69 y.o. female.  Lines, Airways, Drains: External Urinary Catheter (Active)  Collection Container Dedicated Suction Canister 04/05/19 2000  Securement Method Tape 04/05/19 2000  Site Assessment Clean;Intact 04/05/19 2000  Intervention Equipment Changed 04/05/19 2000  Output (mL) 500 mL 04/06/19 0551    Anti-infectives:  Anti-infectives (From admission, onward)   Start     Dose/Rate Route Frequency Ordered Stop   04/03/19 1000  vancomycin (VANCOCIN) IVPB 1000 mg/200 mL premix     1,000 mg 200 mL/hr over 60 Minutes Intravenous Every 12 hours 04/03/19 0037     04/03/19 0600  aztreonam (AZACTAM) 2 g in sodium chloride 0.9 % 100 mL IVPB  Status:  Discontinued     2 g 200 mL/hr over 30 Minutes Intravenous Every 8 hours 04/03/19 0037 04/03/19 0418   04/03/19 0600  piperacillin-tazobactam (ZOSYN) IVPB 3.375 g     3.375 g 12.5 mL/hr over 240 Minutes Intravenous Every 8 hours 04/03/19 0418     04/02/19 2145  metroNIDAZOLE (FLAGYL) IVPB 500 mg     500 mg 100 mL/hr over 60 Minutes Intravenous  Once 04/02/19 2142 04/02/19 2300   04/02/19 2145  vancomycin (VANCOCIN) IVPB 1000 mg/200 mL premix     1,000 mg 200 mL/hr over 60 Minutes Intravenous  Once 04/02/19 2142 04/02/19 2300   04/02/19 2130  aztreonam (AZACTAM) 2 g in sodium chloride 0.9 % 100 mL IVPB     2 g 200 mL/hr over 30 Minutes Intravenous  Once 04/02/19 2142 04/02/19 2300      Microbiology: Results for orders placed or performed during the hospital encounter of 04/02/19  Blood culture (routine x 2)     Status: Abnormal (Preliminary result)   Collection Time: 04/02/19  9:35 PM   Specimen: BLOOD  Result Value Ref Range Status   Specimen Description BLOOD RIGHT ARM  Final   Special Requests   Final    BOTTLES DRAWN AEROBIC AND ANAEROBIC Blood Culture adequate volume Performed at [facility], [place] [address] [place], [place] [address]     Culture  Setup Time   Final    GRAM POSITIVE COCCI IN CLUSTERS AEROBIC BOTTLE ONLY CRITICAL RESULT CALLED TO, READ BACK BY AND VERIFIED WITH: PHARMD JEREMY F. [id number]     Culture (A)  Final    MICROCOCCUS LUTEUS/LYLAE THE SIGNIFICANCE OF ISOLATING THIS ORGANISM FROM A SINGLE SET OF BLOOD CULTURES WHEN MULTIPLE SETS ARE DRAWN IS UNCERTAIN. PLEASE NOTIFY THE MICROBIOLOGY DEPARTMENT WITHIN ONE WEEK IF SPECIATION AND SENSITIVITIES ARE REQUIRED.    Report Status PENDING  Incomplete  Blood culture (routine x 2)     Status: None (Preliminary result)   Collection Time: 04/02/19  9:35 PM   Specimen: BLOOD  Result Value Ref Range Status   Specimen Description BLOOD RIGHT ARM  Final   Special Requests   Final    BOTTLES DRAWN AEROBIC AND ANAEROBIC Blood Culture results may not be optimal due to an inadequate volume of blood received in culture bottles Performed at [facility], [place] [address], [place], [place] [address]    Culture NO GROWTH 3 DAYS  Final   Report Status PENDING  Incomplete  Respiratory Panel by RT PCR (Flu A&B, Covid) - Nasopharyngeal Swab     Status: None   Collection Time: 04/02/19 11:36 PM   Specimen: Nasopharyngeal Swab  Result Value Ref Range Status  SARS Coronavirus 2 by RT PCR NEGATIVE NEGATIVE Final    Comment: (NOTE) SARS-CoV-2 target nucleic acids are NOT DETECTED. The SARS-CoV-2 RNA is generally detectable in upper respiratoy specimens during the acute phase of infection. The lowest concentration of SARS-CoV-2 viral copies this assay can detect is 131 copies/mL. A negative result does not preclude SARS-Cov-2 infection and should not be used as the sole basis for treatment or other patient management decisions. A negative result may occur with  improper specimen collection/handling, submission of specimen other than nasopharyngeal swab, presence of viral mutation(s) within the areas targeted by this assay, and inadequate number of viral copies (<131  copies/mL). A negative result must be combined with clinical observations, patient history, and epidemiological information. The expected result is Negative. Fact Sheet for Patients:  PinkCheek.be Fact Sheet for Healthcare Providers:  GravelBags.it This test is not yet ap proved or cleared by the [place] FDA and  has been authorized for detection and/or diagnosis of SARS-CoV-2 by FDA under an Emergency Use Authorization (EUA). This EUA will remain  in effect (meaning this test can be used) for the duration of the COVID-19 declaration under Section 564(b)(1) of the Act, 21 U.S.C. section 360bbb-3(b)(1), unless the authorization is terminated or revoked sooner.    Influenza A by PCR NEGATIVE NEGATIVE Final   Influenza B by PCR NEGATIVE NEGATIVE Final    Comment: (NOTE) The Xpert Xpress SARS-CoV-2/FLU/RSV assay is intended as an aid in  the diagnosis of influenza from Nasopharyngeal swab specimens and  should not be used as a sole basis for treatment. Nasal washings and  aspirates are unacceptable for Xpert Xpress SARS-CoV-2/FLU/RSV  testing. Fact Sheet for Patients: PinkCheek.be Fact Sheet for Healthcare Providers: GravelBags.it This test is not yet approved or cleared by the [place] FDA and  has been authorized for detection and/or diagnosis of SARS-CoV-2 by  FDA under an Emergency Use Authorization (EUA). This EUA will remain  in effect (meaning this test can be used) for the duration of the  Covid-19 declaration under Section 564(b)(1) of the Act, 21  U.S.C. section 360bbb-3(b)(1), unless the authorization is  terminated or revoked. Performed at [facility], [place] [address], [place], [place] [address]   Urine culture     Status: Abnormal   Collection Time: 04/02/19 11:43 PM   Specimen: In/Out Cath Urine  Result Value Ref Range Status   Specimen  Description IN/OUT CATH URINE  Final   Special Requests STERILE CONTAINER  Final   Culture (A)  Final    >=100,000 COLONIES/mL KLEBSIELLA PNEUMONIAE 10,000 COLONIES/mL PSEUDOMONAS AERUGINOSA Confirmed Extended Spectrum Beta-Lactamase Producer (ESBL).  In bloodstream infections from ESBL organisms, carbapenems are preferred over piperacillin/tazobactam. They are shown to have a lower risk of mortality. FOR KLEBSIELLA PNEUMONIAE Performed at [facility], [place] [address], [place], [place] [address]    Report Status 04/05/2019 FINAL  Final   Organism ID, Bacteria KLEBSIELLA PNEUMONIAE (A)  Final   Organism ID, Bacteria PSEUDOMONAS AERUGINOSA (A)  Final      Susceptibility   Klebsiella pneumoniae - MIC*    AMPICILLIN >=32 RESISTANT Resistant     CEFAZOLIN >=64 RESISTANT Resistant     CEFTRIAXONE >=64 RESISTANT Resistant     CIPROFLOXACIN 1 SENSITIVE Sensitive     GENTAMICIN >=16 RESISTANT Resistant     IMIPENEM 0.5 SENSITIVE Sensitive     NITROFURANTOIN 64 INTERMEDIATE Intermediate     TRIMETH/SULFA >=320 RESISTANT Resistant     AMPICILLIN/SULBACTAM >=32 RESISTANT  Resistant     PIP/TAZO 16 SENSITIVE Sensitive     * >=100,000 COLONIES/mL KLEBSIELLA PNEUMONIAE   Pseudomonas aeruginosa - MIC*    CEFTAZIDIME 8 SENSITIVE Sensitive     CIPROFLOXACIN 0.5 SENSITIVE Sensitive     GENTAMICIN >=16 RESISTANT Resistant     IMIPENEM 2 SENSITIVE Sensitive     PIP/TAZO 16 SENSITIVE Sensitive     CEFEPIME 4 SENSITIVE Sensitive     * 10,000 COLONIES/mL PSEUDOMONAS AERUGINOSA  MRSA PCR Screening     Status: None   Collection Time: 04/03/19  6:01 AM   Specimen: Nasopharyngeal  Result Value Ref Range Status   MRSA by PCR NEGATIVE NEGATIVE Final    Comment:        The GeneXpert MRSA Assay (FDA approved for NASAL specimens only), is one component of a comprehensive MRSA colonization surveillance program. It is not intended to diagnose MRSA infection nor to guide or monitor treatment for MRSA  infections. Performed at [facility], [place] [address] [place], [place] [address]     Best Practice/Protocols:  Events:   Studies: CT ABDOMEN PELVIS W CONTRAST IMPRESSION: 1. 3.4 x 1.2 cm rim enhancing fluid collection superficial to the sacrum is suspicious for abscess. Punctate foci is of adjacent gas could be seen with decubitus ulceration but more aggressive soft tissue infection should be excluded on a clinical basis. 2. Indeterminate presacral lesion, increasing in size since 2019. Could be related to the adjacent fracture of the sacrococcygeal junction though overall remains indeterminate. 3. Bladder wall thickening with some urothelial thickening particular on the right concerning for potential ascending urinary tract infection. No visualized urolithiasis though distal evaluation is limited by streak from right hip arthroplasty. 4. Circumferential body wall edema. Correlate for features of anasarca. 5.  Aortic Atherosclerosis (ICD10-I70.0). And Critical Value/emergent results were called by telephone at the time of interpretation on 04/03/2019 at 1:45 am to provider [facility] , who verbally acknowledged these results. Electronically Signed   By: Lovena Le M.D.   On: 04/03/2019 01:45   DG Chest Port 1 View  IMPRESSION: No active disease. Electronically Signed   By: Constance Holster M.D.   On: 04/02/2019 22:37   CT HEAD CODE STROKE WO CONTRAST IMPRESSION: Stable since 2019 and largely negative for age noncontrast CT appearance of the brain. ASPECTS 10. These results were communicated to Dr. Lorraine Lax at 9:16 pm on 1/30/2021by text page via the [facility] messaging system. Electronically Signed   By: Genevie Ann M.D.   On: 04/02/2019 21:17   [place] EKG SITE RITE  Result Date: 04/04/2019 If Site Rite image not attached, placement could not be confirmed due to current cardiac rhythm.  MRI PELVIS WITHOUT AND WITH CONTRAST IMPRESSION: 04/06/2019 08:10 1. Deep sacral decubitus ulcer extending  down to the lower sacral segments. 2. Small focus of osteomyelitis involving the distal sacral segment. 3. No drainable soft tissue abscess or intrapelvic abscess. 4. 5 cm presacral mass most likely a benign sacral myelolipoma.   Consults:    Subjective:    Overnight Issues:  No overnight issues  Objective:  Vital signs for last 24 hours: Temp:  [98.2 F (36.8 C)-99.4 F (37.4 C)] 99.4 F (37.4 C) (02/03 0000) Pulse Rate:  [59-117] 104 (02/03 0630) Resp:  [13-33] 23 (02/03 0630) BP: (81-142)/(37-117) 101/60 (02/03 0630) SpO2:  [92 %-100 %] 96 % (02/03 0630) Weight:  [112.7 kg] 112.7 kg (02/03 0545)  Hemodynamic parameters for last 24 hours:    Intake/Output  from previous day: 02/02 0701 - 02/03 0700 In: 1274.7 [P.O.:50; I.V.:576.2; IV Piggyback:648.5] Out: 900 [Urine:900]  Intake/Output this shift: No intake/output data recorded.  Vent settings for last 24 hours:    Physical Exam:  General: Laying in bed on herback, no distress, comfortable HENT: Oropharynx clear, pupils equal Lungs: CTAB, normal work and rate of breathing Cardiovascular: Atrial fibrillation, good rate control,soft systolic murmur Abdomen: Obese, soft,nontender,nondistended, positive bowel sounds Extremities: Warm. No peripheral edema Neuro: Awake, alert, interacting appropriately, moves all extremities SKIN: Warm, dry, intact, except, there is a 3.5 x 1.5 x 0.5 cm sacral wound with healthy granulation tissue at the base, no drainage  Assessment/Plan:  69 year old female presenting with septic shock and possible sacral abscess in the setting of a chronic sacral wound and possible UTI.  #Sacral Osteomyelitis #Septic shock (resolved) #Chronic sacral decubitus wound #Sacral myeolipoma #Possible UTI.  UA with large LE but nitrite negative 2/3- Patient stable overnight, MRI performed found no drainable abscess but small focus of sacral osteomyelitis, deep lesion is likely benign  myeolipoma. 2/2- Successfully weaned from levophed and restarted home metoprolol and MAP remains stable, patient complaining primarily of pain from sacral wound, without urinary sx. Pt seen by ID who recommended surgery see patient and send abscess for culture. Surgery was consulted previously and described the sacral wound as healthy appearing without evidence of abscess on exam, and therefore nothing to culture. ID also rec 6 weeks antibiotic therapy for possible osteomyelitis. Patient complained of cough related to eating for past few months. Patient is responding well to current therapy with vanc/zosyn.  -Plan for transfer from ICU to medical floor -Plan for abx tx for osteomyelitis per ID recs -Repeat cultures ordered   Chronic issues: #Chronic A. Fib: On Coumadin at home, INR therapeutic at 2.2.  HR moderately elevated to 110s this afternoon despite metoprolol. This may improve as infection resolves. Warfarin restarted.  #Depression/anxiety: Continue home Abilify, Zoloft, Buspar, and low-dose Xanax  #Restless leg syndrome: Continue pramipexole  #Diabetes: On Lantus 40 at home -Sliding-scale insulin -Lantus decreased to 25 units for now -Probably start a diet if no planned procedures  LOS: 3 days   Critical Care Total Time*: 15 Minutes  Buena Irish 04/06/2019  *Care during the described time interval was provided by me and/or other providers on the critical care team.  I have reviewed this patient's available data, including medical history, events of note, physical examination and test results as part of my evaluation.

## 2019-04-06 NOTE — Progress Notes (Signed)
Pharmacy Antibiotic Note  HERMIONE HAUSKNECHT is a 69 y.o. female admitted on 04/02/2019 with septic shock.  Pharmacy has been consulted for Zosyn dosing. On MRI yesterday, patient noted to have sacral osteo in a small area. SCr < 1. Patient afebrile.    Plan: Stop Vanc Zosyn 3.375 gm q 8 hours (EI) Plan 6-8 weeks of therapy via pump for home infusion  Height: 5\' 11"  (180.3 cm) Weight: 248 lb 7.3 oz (112.7 kg) IBW/kg (Calculated) : 70.8  Temp (24hrs), Avg:98.8 F (37.1 C), Min:98.2 F (36.8 C), Max:99.4 F (37.4 C)  Recent Labs  Lab 04/02/19 2100 04/02/19 2100 04/02/19 2112 04/02/19 2348 04/03/19 0401 04/04/19 0338 04/05/19 0240 04/06/19 0053 04/06/19 0838  WBC 12.5*  --   --   --  17.0* 7.1  --   --   --   CREATININE 1.14*   < > 0.90  --  0.92 0.87 0.82 0.80  --   LATICACIDVEN 2.4*  --   --  1.9  --   --   --   --   --   VANCOTROUGH  --   --   --   --   --   --   --   --  18  VANCOPEAK  --   --   --   --   --   --   --  28*  --    < > = values in this interval not displayed.    Estimated Creatinine Clearance: 93.1 mL/min (by C-G formula based on SCr of 0.8 mg/dL).    Allergies  Allergen Reactions  . Bee Venom Hives    Takes benadryl  . Other Other (See Comments)    Raw foods with seeds give her "boils".  Avoids raw strawberries, blueberries. Tolerates cooked fruits, tomato sauce, bread/grains with seeds. [facility] 10/17/13 Berries with seeds Allergy to glutamine-C-quercet-selen-brom per [facility] 09/25/17  . Gluten Meal Other (See Comments)    Per MAR  . Alteplase Rash  . Cefepime Rash  . Fentanyl Other (See Comments)    Hallucinations, syncope    . Metformin And Related Diarrhea   Jimmy Footman, PharmD, BCPS, BCIDP Infectious Diseases Clinical Pharmacist Phone: [phone] 04/06/2019 10:45 AM

## 2019-04-06 NOTE — Progress Notes (Signed)
SLP Cancellation Note  Patient Details Name: Donna [name] MRN: [id number] DOB: [birth year]   Cancelled evaluation:      pt passed [facility] swallow screen, therefore formal SLP swallow evaluation not warranted.  Our service will sign off. D/W RN.  Letcher Schweikert L. Tivis Ringer, [place] CCC/SLP Acute Rehabilitation Services Office number [phone]     Juan Quam Laurice 04/06/2019, 9:21 AM

## 2019-04-06 NOTE — Progress Notes (Signed)
Peripherally Inserted Central Catheter/Midline Placement  The IV Nurse has discussed with the patient and/or persons authorized to consent for the patient, the purpose of this procedure and the potential benefits and risks involved with this procedure.  The benefits include less needle sticks, lab draws from the catheter, and the patient may be discharged home with the catheter. Risks include, but not limited to, infection, bleeding, blood clot (thrombus formation), and puncture of an artery; nerve damage and irregular heartbeat and possibility to perform a PICC exchange if needed/ordered by physician.  Alternatives to this procedure were also discussed.  Bard Power PICC patient education guide, fact sheet on infection prevention and patient information card has been provided to patient /or left at bedside.    PICC/Midline Placement Documentation  PICC Single Lumen [id number] PICC Right Basilic 40 cm 0 cm (Active)  Indication for Insertion or Continuance of Line Prolonged intravenous therapies 04/06/19 1008  Exposed Catheter (cm) 0 cm 04/06/19 1008  Site Assessment Clean;Dry;Intact 04/06/19 1008  Line Status Flushed;Blood return noted 04/06/19 1008  Dressing Type Transparent 04/06/19 1008  Dressing Status Clean;Dry;Intact;Antimicrobial disc in place;Other (Comment) 04/06/19 1008  Dressing Intervention New dressing 04/06/19 1008  Dressing Change Due 04/13/19 04/06/19 1008       Christella Noa Albarece 04/06/2019, 10:09 AM

## 2019-04-06 NOTE — Progress Notes (Addendum)
Subjective: No new complaints, having PICC placed   Antibiotics:  Anti-infectives (From admission, onward)   Start     Dose/Rate Route Frequency Ordered Stop   04/03/19 1000  vancomycin (VANCOCIN) IVPB 1000 mg/200 mL premix  Status:  Discontinued     1,000 mg 200 mL/hr over 60 Minutes Intravenous Every 12 hours 04/03/19 0037 04/06/19 0956   04/03/19 0600  aztreonam (AZACTAM) 2 g in sodium chloride 0.9 % 100 mL IVPB  Status:  Discontinued     2 g 200 mL/hr over 30 Minutes Intravenous Every 8 hours 04/03/19 0037 04/03/19 0418   04/03/19 0600  piperacillin-tazobactam (ZOSYN) IVPB 3.375 g     3.375 g 12.5 mL/hr over 240 Minutes Intravenous Every 8 hours 04/03/19 0418     04/02/19 2145  metroNIDAZOLE (FLAGYL) IVPB 500 mg     500 mg 100 mL/hr over 60 Minutes Intravenous  Once 04/02/19 2142 04/02/19 2300   04/02/19 2145  vancomycin (VANCOCIN) IVPB 1000 mg/200 mL premix     1,000 mg 200 mL/hr over 60 Minutes Intravenous  Once 04/02/19 2142 04/02/19 2300   04/02/19 2130  aztreonam (AZACTAM) 2 g in sodium chloride 0.9 % 100 mL IVPB     2 g 200 mL/hr over 30 Minutes Intravenous  Once 04/02/19 2142 04/02/19 2300      Medications: Scheduled Meds: . ARIPiprazole  10 mg Oral Daily  . busPIRone  10 mg Oral BID  . chlorhexidine  15 mL Mouth Rinse BID  . Chlorhexidine Gluconate Cloth  6 each Topical Daily  . ezetimibe  10 mg Oral QHS  . gabapentin  300 mg Oral TID  . insulin aspart  0-15 Units Subcutaneous TID WC  . insulin aspart  0-5 Units Subcutaneous QHS  . insulin glargine  25 Units Subcutaneous QHS  . mouth rinse  15 mL Mouth Rinse q12n4p  . mouth rinse  15 mL Mouth Rinse BID  . metoprolol tartrate  25 mg Oral BID  . pantoprazole  40 mg Oral Daily  . pramipexole  0.125 mg Oral QHS  . sertraline  100 mg Oral QHS  . sodium chloride flush  10-40 mL Intracatheter Q12H  . warfarin  7.5 mg Oral ONCE-1800  . Warfarin - Pharmacist Dosing Inpatient   Does not apply q1800    Continuous Infusions: . sodium chloride Stopped (04/05/19 0528)  . phenylephrine (NEO-SYNEPHRINE) Adult infusion Stopped (04/05/19 1126)  . piperacillin-tazobactam (ZOSYN)  IV 3.375 g (04/06/19 0601)   PRN Meds:.ALPRAZolam, oxyCODONE, sodium chloride flush    Objective: Weight change: -1.5 kg  Intake/Output Summary (Last 24 hours) at 04/06/2019 1218 Last data filed at 04/06/2019 1000 Gross per 24 hour  Intake 491.04 ml  Output 901 ml  Net -409.96 ml   Blood pressure 112/67, pulse (!) 114, temperature 98.9 F (37.2 C), temperature source Oral, resp. rate (!) 25, height '5\' 11"'$  (1.803 m), weight 112.7 kg, SpO2 98 %. Temp:  [98.2 F (36.8 C)-99.4 F (37.4 C)] 98.9 F (37.2 C) (02/03 0856) Pulse Rate:  [74-121] 114 (02/03 1033) Resp:  [13-33] 25 (02/03 1100) BP: (89-126)/(47-83) 112/67 (02/03 1100) SpO2:  [95 %-100 %] 98 % (02/03 1030) Weight:  [112.7 kg] 112.7 kg (02/03 0545)  Physical Exam: General: Alert and awake, oriented x3   PICC being placed  Neuro nonfocal  CBC:    BMET Recent Labs    04/05/19 0240 04/06/19 0053  NA 136 136  K 3.4* 3.7  CL  100 100  CO2 26 26  GLUCOSE 149* 162*  BUN 11 11  CREATININE 0.82 0.80  CALCIUM 8.8* 8.8*     Liver Panel  No results for input(s): PROT, ALBUMIN, AST, ALT, ALKPHOS, BILITOT, BILIDIR, IBILI in the last 72 hours.     Sedimentation Rate Recent Labs    04/05/19 0240  ESRSEDRATE 30*   C-Reactive Protein Recent Labs    04/05/19 0240  CRP 11.8*    Micro Results: Recent Results (from the past 720 hour(s))  Blood culture (routine x 2)     Status: Abnormal   Collection Time: 04/02/19  9:35 PM   Specimen: BLOOD  Result Value Ref Range Status   Specimen Description BLOOD RIGHT ARM  Final   Special Requests   Final    BOTTLES DRAWN AEROBIC AND ANAEROBIC Blood Culture adequate volume   Culture  Setup Time   Final    GRAM POSITIVE COCCI IN CLUSTERS AEROBIC BOTTLE ONLY CRITICAL RESULT CALLED TO, READ  BACK BY AND VERIFIED WITH: PHARMD JEREMY F. [id number]     Culture (A)  Final    MICROCOCCUS LUTEUS/LYLAE Standardized susceptibility testing for this organism is not available. Performed at [facility], [place] [address], [place], [place] [address]    Report Status 04/06/2019 FINAL  Final  Blood culture (routine x 2)     Status: Donna [name] (Preliminary result)   Collection Time: 04/02/19  9:35 PM   Specimen: BLOOD  Result Value Ref Range Status   Specimen Description BLOOD RIGHT ARM  Final   Special Requests   Final    BOTTLES DRAWN AEROBIC AND ANAEROBIC Blood Culture results may not be optimal due to an inadequate volume of blood received in culture bottles   Culture   Final    NO GROWTH 4 DAYS Performed at [facility], [place] [address], [place], [place] [address]    Report Status PENDING  Incomplete  Respiratory Panel by RT PCR (Flu A&B, Covid) - Nasopharyngeal Swab     Status: Donna [name]   Collection Time: 04/02/19 11:36 PM   Specimen: Nasopharyngeal Swab  Result Value Ref Range Status   SARS Coronavirus 2 by RT PCR NEGATIVE NEGATIVE Final    Comment: (NOTE) SARS-CoV-2 target nucleic acids are NOT DETECTED. The SARS-CoV-2 RNA is generally detectable in upper respiratoy specimens during the acute phase of infection. The lowest concentration of SARS-CoV-2 viral copies this assay can detect is 131 copies/mL. A negative result does not preclude SARS-Cov-2 infection and should not be used as the sole basis for treatment or other patient management decisions. A negative result may occur with  improper specimen collection/handling, submission of specimen other than nasopharyngeal swab, presence of viral mutation(s) within the areas targeted by this assay, and inadequate number of viral copies (<131 copies/mL). A negative result must be combined with clinical observations, patient history, and epidemiological information. The expected result is Negative. Fact Sheet for Patients:   PinkCheek.be Fact Sheet for Healthcare Providers:  GravelBags.it This test is not yet ap proved or cleared by the [place] FDA and  has been authorized for detection and/or diagnosis of SARS-CoV-2 by FDA under an Emergency Use Authorization (EUA). This EUA will remain  in effect (meaning this test can be used) for the duration of the COVID-19 declaration under Section 564(b)(1) of the Act, 21 U.S.C. section 360bbb-3(b)(1), unless the authorization is terminated or revoked sooner.    Influenza A by PCR NEGATIVE NEGATIVE Final   Influenza B  by PCR NEGATIVE NEGATIVE Final    Comment: (NOTE) The Xpert Xpress SARS-CoV-2/FLU/RSV assay is intended as an aid in  the diagnosis of influenza from Nasopharyngeal swab specimens and  should not be used as a sole basis for treatment. Nasal washings and  aspirates are unacceptable for Xpert Xpress SARS-CoV-2/FLU/RSV  testing. Fact Sheet for Patients: PinkCheek.be Fact Sheet for Healthcare Providers: GravelBags.it This test is not yet approved or cleared by the [place] FDA and  has been authorized for detection and/or diagnosis of SARS-CoV-2 by  FDA under an Emergency Use Authorization (EUA). This EUA will remain  in effect (meaning this test can be used) for the duration of the  Covid-19 declaration under Section 564(b)(1) of the Act, 21  U.S.C. section 360bbb-3(b)(1), unless the authorization is  terminated or revoked. Performed at [facility], [place] [address], [place], [place] [address]   Urine culture     Status: Abnormal   Collection Time: 04/02/19 11:43 PM   Specimen: In/Out Cath Urine  Result Value Ref Range Status   Specimen Description IN/OUT CATH URINE  Final   Special Requests STERILE CONTAINER  Final   Culture (A)  Final    >=100,000 COLONIES/mL KLEBSIELLA PNEUMONIAE 10,000 COLONIES/mL PSEUDOMONAS  AERUGINOSA Confirmed Extended Spectrum Beta-Lactamase Producer (ESBL).  In bloodstream infections from ESBL organisms, carbapenems are preferred over piperacillin/tazobactam. They are shown to have a lower risk of mortality. FOR KLEBSIELLA PNEUMONIAE Performed at [facility], [place] [address] [place], [place] [address]    Report Status 04/05/2019 FINAL  Final   Organism ID, Bacteria KLEBSIELLA PNEUMONIAE (A)  Final   Organism ID, Bacteria PSEUDOMONAS AERUGINOSA (A)  Final      Susceptibility   Klebsiella pneumoniae - MIC*    AMPICILLIN >=32 RESISTANT Resistant     CEFAZOLIN >=64 RESISTANT Resistant     CEFTRIAXONE >=64 RESISTANT Resistant     CIPROFLOXACIN 1 SENSITIVE Sensitive     GENTAMICIN >=16 RESISTANT Resistant     IMIPENEM 0.5 SENSITIVE Sensitive     NITROFURANTOIN 64 INTERMEDIATE Intermediate     TRIMETH/SULFA >=320 RESISTANT Resistant     AMPICILLIN/SULBACTAM >=32 RESISTANT Resistant     PIP/TAZO 16 SENSITIVE Sensitive     * >=100,000 COLONIES/mL KLEBSIELLA PNEUMONIAE   Pseudomonas aeruginosa - MIC*    CEFTAZIDIME 8 SENSITIVE Sensitive     CIPROFLOXACIN 0.5 SENSITIVE Sensitive     GENTAMICIN >=16 RESISTANT Resistant     IMIPENEM 2 SENSITIVE Sensitive     PIP/TAZO 16 SENSITIVE Sensitive     CEFEPIME 4 SENSITIVE Sensitive     * 10,000 COLONIES/mL PSEUDOMONAS AERUGINOSA  MRSA PCR Screening     Status: Donna [name]   Collection Time: 04/03/19  6:01 AM   Specimen: Nasopharyngeal  Result Value Ref Range Status   MRSA by PCR NEGATIVE NEGATIVE Final    Comment:        The GeneXpert MRSA Assay (FDA approved for NASAL specimens only), is one component of a comprehensive MRSA colonization surveillance program. It is not intended to diagnose MRSA infection nor to guide or monitor treatment for MRSA infections. Performed at [facility], [name] [address] [place], [place] [address]   Culture, blood (Routine X 2) w Reflex to ID Panel     Status: Donna [name] (Preliminary result)    Collection Time: 04/05/19 10:19 AM   Specimen: BLOOD RIGHT ARM  Result Value Ref Range Status   Specimen Description BLOOD RIGHT ARM  Final   Special  Requests   Final    BOTTLES DRAWN AEROBIC ONLY Blood Culture adequate volume   Culture   Final    NO GROWTH < 24 HOURS Performed at [facility], [place] [address] [place], [place] [address]    Report Status PENDING  Incomplete  Culture, blood (Routine X 2) w Reflex to ID Panel     Status: Donna [name] (Preliminary result)   Collection Time: 04/05/19 10:25 AM   Specimen: BLOOD LEFT ARM  Result Value Ref Range Status   Specimen Description BLOOD LEFT ARM  Final   Special Requests   Final    BOTTLES DRAWN AEROBIC ONLY Blood Culture adequate volume   Culture   Final    NO GROWTH < 24 HOURS Performed at [facility], [place] [address], [place], [place] [address]    Report Status PENDING  Incomplete    Studies/Results: MR SACRUM SI JOINTS W WO CONTRAST  Result Date: 04/06/2019 CLINICAL DATA:  Chronic sacral decubitus ulcer. EXAM: MRI PELVIS WITHOUT AND WITH CONTRAST TECHNIQUE: Multiplanar multisequence MR imaging of the pelvis was performed both before and after administration of intravenous contrast. CONTRAST:  21m GADAVIST GADOBUTROL 1 MMOL/ML IV SOLN COMPARISON:  CT scan 04/03/2019 FINDINGS: There is a deep sacral decubitus ulcer extending down adjacent to the lower sacral segments. Enhancing inflammatory granulation tissue around the ulcer but no abscess. Mild surrounding cellulitis. No evidence of myofasciitis involving the atrophied gluteal muscles. Small focus of abnormal T2 signal intensity and subsequent enhancement in the distal sacral segment consistent with osteomyelitis. Again noted is a 5 cm presacral mass. This has areas of fatty signal intensity and is most likely a benign sacral myelolipoma. No findings for septic arthritis involving the SI joints. Significant artifact from a right hip prosthesis. No intrapelvic abscess is  identified. Moderate to large amount of stool is noted in the rectum. IMPRESSION: 1. Deep sacral decubitus ulcer extending down to the lower sacral segments. 2. Small focus of osteomyelitis involving the distal sacral segment. 3. No drainable soft tissue abscess or intrapelvic abscess. 4. 5 cm presacral mass most likely a benign sacral myelolipoma. Electronically Signed   By: PMarijo SanesM.D.   On: 04/06/2019 08:10   DG Chest Port 1 View  Result Date: 04/06/2019 CLINICAL DATA:  Status post PICC placement. EXAM: PORTABLE CHEST 1 VIEW COMPARISON:  Single-view of the chest 04/02/2019. FINDINGS: Right PICC is in place with the tip projecting in the lower superior vena cava. Lungs are clear. No pneumothorax or pleural effusion. Cardiomegaly noted. The patient is status post right shoulder surgery. IMPRESSION: Tip of right PICC projects in the lower superior vena cava. Cardiomegaly. Atherosclerosis. Electronically Signed   By: TInge RiseM.D.   On: 04/06/2019 10:53      Assessment/Plan:  INTERVAL HISTORY:   MRI showed osteomyelitis  in sacrum    Principal Problem:   Abscess of back Active Problems:   Sepsis ([place]   Pressure injury of skin    NTRINIDAD INGLEis a 69y.o. female with 69y.o. female with multiple medical problems including atrial fibrillation on Coumadin, obstructive sleep apnea heart failure diabetes mellitus with a chronic sacral decubitus ulcer.  She was admitted to the ICU in septic shock.  Blood cultures were taken and she was initiated on broad-spectrum antibiotics in the form of vancomycin and Zosyn.  S.  She has a chronic sacral decubitus ulcer that was evaluated by general surgery.  She is in the interim had  a CT scan performed which shows a 3.4 x 1.2 cm rim-enhancing fluid collection that is superficial and near the sacrum concerning for abscess with potential gas.  MRI showing osteo in the sacrum and delineating there is no drainable abscess and that the presacral  lesion is a 5 cm presacral mass most likely a benign sacral myelolipoma.   1.  Sacral osteomyelitis: Likely polymicrobial but we will take away anti-MRSA activity since we have not seen MRSA on her PCR's.  I will plan on using Zosyn which should be active against the organisms that are at least colonizing her urine and might potentially be involved in this wound near her sacrum and the bone.  We will plan on at least a 6-week course of Zosyn that can be given as a continuous infusion.  I will place order for opat  Diagnosis: Sacral osteomyelitis  Culture Result: Donna [name]  Allergies  Allergen Reactions  . Bee Venom Hives    Takes benadryl  . Other Other (See Comments)    Raw foods with seeds give her "boils".  Avoids raw strawberries, blueberries. Tolerates cooked fruits, tomato sauce, bread/grains with seeds. [facility] 10/17/13 Berries with seeds Allergy to glutamine-C-quercet-selen-brom per [facility] 09/25/17  . Gluten Meal Other (See Comments)    Per MAR  . Alteplase Rash  . Cefepime Rash  . Fentanyl Other (See Comments)    Hallucinations, syncope    . Metformin And Related Diarrhea    OPAT Orders Discharge antibiotics:  Zosyn via continuous infusion  Duration:  6 weeks End Date:  05/13/19  [facility] Care Per Protocol:    Labs  weekly while on IV antibiotics: x__ CBC with differential _x_ BMP w GFR/CMP _x_ CRP _x_ ESR   x__ Please pull PIC at completion of IV antibiotics __ Please leave PIC in place until doctor has seen patient or been notified  Fax weekly labs to [phone]   Oletta Lamas Allum has an appointment on 05/04/2019 at 10 am with Dr. Tommy Medal  The [facility] is located in the [facility] at   [place] in [place].  [address], which is located to the left of the elevators.  Phone: [phone]  Fax: [phone]  [email or web address]  She should arrive 15 minutes prior to her  appointment.  I will sign off for now please call with further questions.    LOS: 3 days   Alcide Evener 04/06/2019, 12:18 PM

## 2019-04-06 NOTE — TOC Initial Note (Signed)
Transition of Care [facility]) - Initial/Assessment Note    Patient Details  Name: Donna [name] MRN: [id number] Date of Birth: 14-Aug-1950  Transition of Care [facility]) CM/SW Contact:    Marilu Favre, RN Phone Number: 04/06/2019, 2:20 PM  Clinical Narrative:                 Spoke to patient at bedside. From home with husband. Has wheelchair and walker at home already. Discussed plan for IV antibiotics at home. Explained home health RN will not be able to make daily visits, patient and husband will be taught how to administer medication.   Patient voiced understanding.    Patient active  with [facility] PTA with RN,PT,OT will need resumption of care orders. Patient wants to remain with [facility]. Infusion Company will be [facility], [place] following.  Expected Discharge Plan: [place] Barriers to Discharge: Continued Medical Work up   Patient Goals and CMS Choice Patient states their goals for this hospitalization and ongoing recovery are:: to return to home CMS Medicare.gov Compare Post Acute Care list provided to:: Patient Choice offered to / list presented to : Patient  Expected Discharge Plan and Services Expected Discharge Plan: [place]   Discharge Planning Services: CM Consult Post Acute Care Choice: [place] arrangements for the past 2 months: Single Family Home                 DME Arranged: N/A         HH Arranged: PT, OT, RN [place] Agency: [place] ([facility]) Date HH Agency Contacted: 04/06/19 Time HH Agency Contacted: 49 Representative spoke with at [place]: [place] Arrangements/Services Living arrangements for the past 2 months: [place] with:: Spouse Patient language and need for interpreter reviewed:: Yes Do you feel safe going back to the place where you live?: Yes      Need for Family Participation in Patient Care: Yes (Comment) Care giver support  system in place?: Yes (comment) Current home services: DME, Home OT, Home PT, Home RN Criminal Activity/Legal Involvement Pertinent to Current Situation/Hospitalization: No - Comment as needed  Activities of Daily Living Home Assistive Devices/Equipment: Wheelchair ADL Screening (condition at time of admission) Patient's cognitive ability adequate to safely complete daily activities?: Yes Is the patient deaf or have difficulty hearing?: No Does the patient have difficulty seeing, even when wearing glasses/contacts?: No Does the patient have difficulty concentrating, remembering, or making decisions?: No Patient able to express need for assistance with ADLs?: Yes Does the patient have difficulty dressing or bathing?: Yes Independently performs ADLs?: No Communication: Independent Dressing (OT): Needs assistance Is this a change from baseline?: Pre-admission baseline Grooming: Appropriate for developmental age Feeding: Appropriate for developmental age Bathing: Needs assistance Is this a change from baseline?: Pre-admission baseline Toileting: Needs assistance Is this a change from baseline?: Pre-admission baseline In/Out Bed: Needs assistance Is this a change from baseline?: Pre-admission baseline Walks in Home: Dependent Is this a change from baseline?: Pre-admission baseline Does the patient have difficulty walking or climbing stairs?: Yes Weakness of Legs: Both Weakness of Arms/Hands: Both  Permission Sought/Granted   Permission granted to share information with : No              Emotional Assessment Appearance:: Appears stated age Attitude/Demeanor/Rapport: Engaged Affect (typically observed): Accepting Orientation: : Oriented to Self, Oriented to Place, Oriented to  Time, Oriented to Situation Alcohol /  Substance Use: Not Applicable Psych Involvement: No (comment)  Admission diagnosis:  Abscess [L02.91] Sepsis ([place]) [A41.9] Pressure injury of skin of sacral  region, unspecified injury stage [L89.159] Sepsis, due to unspecified organism, unspecified whether acute organ dysfunction present [facility]) [A41.9] Patient Active Problem List   Diagnosis Date Noted  . Abscess of back 04/04/2019  . Sepsis ([place]) 04/03/2019  . Pressure injury of skin 04/03/2019  . Orthostatic hypotension   . Leukocytosis 11/13/2018  . Hypomagnesemia 02/02/2018  . Preop cardiovascular exam 02/02/2018  . Decubitus ulcer of ischium, left, stage IV ([place]) 09/28/2017  . Atrial fibrillation with RVR ([place]) 09/25/2017  . Cellulitis of sacral region 09/25/2017  . Normocytic anemia 09/25/2017  . Type 2 diabetes mellitus ([place]) 09/25/2017  . Chronic pain 09/25/2017  . Chronic atrial fibrillation with RVR   . History of seizures 09/03/2017  . Hypokalemia 09/03/2017  . Depression 02/09/2017  . Anxiety 02/09/2017  . Venous stasis of both lower extremities 07/25/2016  . Essential hypertension 07/25/2016  . Anticoagulant long-term use 07/15/2016  . Anatomical narrow angle borderline glaucoma of both eyes 04/02/2016  . Chronic diastolic CHF (congestive heart failure) ([place]) 02/08/2016  . Chronic atrial fibrillation ([place]): CHA2DS2-VASc Score 5; on Warfarin 02/08/2016  . Morbid obesity ([place]) 02/08/2016  . OSA (obstructive sleep apnea) 02/08/2016  . Pre-operative cardiovascular examination 02/08/2016  . Medication management 02/08/2016  . Fibromyalgia 08/07/2012  . Restless legs syndrome 08/07/2012   PCP:  Janine Limbo, PA-C Pharmacy:   [facility] [place], [place] AT [address] [place] [address] Phone: [phone] Fax: [phone]     Social Determinants of Health (SDOH) Interventions    Readmission Risk Interventions No flowsheet data found.

## 2019-04-06 NOTE — Progress Notes (Signed)
[facility] for Coumadin Indication: atrial fibrillation  Allergies  Allergen Reactions  . Bee Venom Hives    Takes benadryl  . Other Other (See Comments)    Raw foods with seeds give her "boils".  Avoids raw strawberries, blueberries. Tolerates cooked fruits, tomato sauce, bread/grains with seeds. [facility] 10/17/13 Berries with seeds Allergy to glutamine-C-quercet-selen-brom per [facility] 09/25/17  . Gluten Meal Other (See Comments)    Per MAR  . Alteplase Rash  . Cefepime Rash  . Fentanyl Other (See Comments)    Hallucinations, syncope    . Metformin And Related Diarrhea    Patient Measurements: Height: 5\' 11"  (180.3 cm) Weight: 248 lb 7.3 oz (112.7 kg) IBW/kg (Calculated) : 70.8  Vital Signs: Temp: 98.9 F (37.2 C) (02/03 0856) Temp Source: Oral (02/03 0856) BP: 100/66 (02/03 1033) Pulse Rate: 114 (02/03 1033)  Labs: Recent Labs    04/04/19 0338 04/05/19 0240 04/05/19 1401 04/06/19 0053  HGB 10.9*  --   --   --   HCT 37.3  --   --   --   PLT 176  --   --   --   LABPROT 20.3*  --  20.5* 21.5*  INR 1.8*  --  1.8* 1.9*  CREATININE 0.87 0.82  --  0.80    Estimated Creatinine Clearance: 93.1 mL/min (by C-G formula based on SCr of 0.8 mg/dL).   Medical History: Past Medical History:  Diagnosis Date  . CHF (congestive heart failure) ([place])   . Chronic /permanent atrial fibrillation ([place]) 2005   On Warfarin (follwed @ [facility]); Rate controlled - On BB  . Chronic diastolic heart failure, NYHA class 2 ([place])    Updated by A. fib, hypertensive heart disease and obesity; EDP was only 7 by cardiac catheterization in September 2017  . Diabetic lumbosacral plexopathy ([place])   . Fibromyalgia   . Generalized anxiety disorder   . Hypokalemia 09/03/2017  . Incontinence   . Major depressive disorder   . OSA on CPAP   . Restless leg syndrome   . Syncope 09/03/2017  . Type II diabetes mellitus [facility])     Assessment: 69  yo female on chronic Coumadin for afib, admitted 1/30 with sepsis.  Pharmacy asked to resume anticoagulation with Coumadin (restarted 1/31).  INR 1.9 today.  PTA dose a bit confusing, I think normally she takes Coumadin 8 mg daily, but it seems pt had elevated INR Monday (4.4) she didn't take any Tuesday, 6 mg Wed, None Thurs, 7 mg Fri and supposed to take 8 Sat, but missed dose because was admitted to the hospital.  Goal of Therapy:  INR 2-3 Monitor platelets by anticoagulation protocol: Yes   Plan:  1. Coumadin 7.5 mg po x 1 tonight  2. Daily PT/INR.  Hildred Laser, PharmD Clinical Pharmacist **Pharmacist phone directory can now be found on [email or web address] ([email or web address]).  Listed under [place].

## 2019-04-06 NOTE — Progress Notes (Signed)
PHARMACY CONSULT NOTE FOR:  OUTPATIENT  PARENTERAL ANTIBIOTIC THERAPY (OPAT)  Indication: Sacral Osteomyelitis Regimen: Zosyn 13.5 gm every 24 hours as a continuous infusion End date: 05/13/19  IV antibiotic discharge orders are pended. To discharging provider:  please sign these orders via discharge navigator,  Select New Orders & click on the button choice - Manage This Unsigned Work.     Thank you for allowing pharmacy to be a part of this patient's care.  Jimmy Footman, PharmD, BCPS, [facility] Clinical Pharmacist Phone: [phone] 04/06/2019, 12:31 PM

## 2019-04-07 LAB — CULTURE, BLOOD (ROUTINE X 2): Culture: NO GROWTH

## 2019-04-07 LAB — MAGNESIUM: Magnesium: 1.6 mg/dL — ABNORMAL LOW (ref 1.7–2.4)

## 2019-04-07 LAB — GLUCOSE, CAPILLARY
Glucose-Capillary: 146 mg/dL — ABNORMAL HIGH (ref 70–99)
Glucose-Capillary: 162 mg/dL — ABNORMAL HIGH (ref 70–99)

## 2019-04-07 LAB — PROTIME-INR
INR: 1.7 — ABNORMAL HIGH (ref 0.8–1.2)
Prothrombin Time: 19.6 seconds — ABNORMAL HIGH (ref 11.4–15.2)

## 2019-04-07 MED ORDER — FUROSEMIDE 20 MG PO TABS: 20.0000 mg | ORAL_TABLET | Freq: Every day | ORAL | Status: DC

## 2019-04-07 MED ORDER — METOPROLOL SUCCINATE ER 25 MG PO TB24: 25.0000 mg | ORAL_TABLET | Freq: Every day | ORAL | Status: DC

## 2019-04-07 MED ORDER — PIPERACILLIN-TAZOBACTAM IV (FOR PTA / DISCHARGE USE ONLY): 13.5000 g | INTRAVENOUS | 0 refills | Status: DC

## 2019-04-07 MED ORDER — MAGNESIUM SULFATE 2 GM/50ML IV SOLN
2.0000 g | Freq: Once | INTRAVENOUS | Status: AC
  Administered 2019-04-07: 2 g via INTRAVENOUS
  Filled 2019-04-07: qty 50

## 2019-04-07 MED ORDER — POTASSIUM CHLORIDE CRYS ER 20 MEQ PO TBCR: 20.0000 meq | EXTENDED_RELEASE_TABLET | Freq: Every day | ORAL | 0 refills | Status: DC

## 2019-04-07 MED ORDER — OXYCODONE HCL 5 MG PO TABS: 5.0000 mg | ORAL_TABLET | Freq: Four times a day (QID) | ORAL | 0 refills | Status: DC | PRN

## 2019-04-07 MED ORDER — WARFARIN SODIUM 5 MG PO TABS: 10.0000 mg | ORAL_TABLET | Freq: Once | ORAL | Status: DC

## 2019-04-07 MED ORDER — BASAGLAR KWIKPEN 100 UNIT/ML ~~LOC~~ SOPN: 25.0000 [IU] | PEN_INJECTOR | Freq: Every day | SUBCUTANEOUS | Status: DC

## 2019-04-07 MED ORDER — SPIRONOLACTONE 25 MG PO TABS: 12.5000 mg | ORAL_TABLET | Freq: Every day | ORAL | Status: DC

## 2019-04-07 NOTE — Discharge Summary (Addendum)
Physician Discharge Summary  Donna [name] QZR:[id number] DOB: 1951-01-17 DOA: 04/02/2019  PCP: Janine Limbo, PA-C Infectious Diseases:   C. Tommy Medal [facility]   Admit date: 04/02/2019 Discharge date: 04/07/2019  Admitted From:  Home  Disposition:  Home with [place] (declined SNF)  Recommendations for Outpatient Follow-up:  1. Follow up with PCP in 1 weeks 2. Follow up with ID Dr. Tommy Medal on 05/04/19 at 10 am as scheduled  OPAT Orders Discharge antibiotics:  Zosyn via continuous infusion  Duration:  6 weeks End Date:  05/13/19  PICC Care Per Protocol:  Labs  weekly while on IV antibiotics: x__ CBC with differential _x_ BMP w GFR/CMP _x_ CRP _x_ ESR   x__ Please pull PIC at completion of IV antibiotics __ Please leave PIC in place until doctor has seen patient or been notified  Fax weekly labs to [phone]) [phone]  Oletta Lamas Goh has an appointment on 05/04/2019 at 10 am with Dr. Tommy Medal  The [facility] for Infectious Disease is located in the [facility] at   [place] in [place].  [address], which is located to the left of the elevators.  Phone: [phone]  Fax: [phone]  [email or web address]   Home Health:  RN for IV antibiotics, wound care, PT, OT, Aide, SW  Discharge Condition: STABLE   CODE STATUS: FULL     Brief Hospitalization Summary: Please see all hospital notes, images, labs for full details of the hospitalization. ADMISSION HPI:  [name] with a history of Afib on warfarin, chronic sacral decubitus ulcer, OSA, diastolic heart failure, diabetes, restless leg syndrome, depression who presented initially as a code stroke for generalized "shaking," found to be in septic shock with 2 potential sources being sacral decub ulcer/possible abscess, and UTI.    History of present illness   Evidently, patient started trembling this evening around 730pm and was still able to talk to  her husband.  Was not found to have a stroke on evaluation by neurology.  However was found to have low blood pressures and fever, concerning for septic shock.  She was given 3.5 L of fluid, however with persistently low blood pressures was started on Levophed.   On examination patient was alert and oriented.  She denied any abdominal pain, but did say that her sacral wound was painful.  She denies any increased pain or increased drainage over the last several weeks, however she does have persistent drainage from the site.  She currently is living with her husband with home health, has been wheelchair-bound up until a few weeks ago she is occasionally taking steps with a walker.  She denies any dysuria, but does have a history of UTIs.   Donna [name] is a 69 y.o. female with 69 y.o.femalewith multiple medical problems including atrial fibrillation on Coumadin, obstructive sleep apnea heart failure diabetes mellitus with a chronic sacral decubitus ulcer. She was admitted to the ICU in septic shock. Blood cultures were taken and she was initiated on broad-spectrum antibiotics in the form of vancomycin and Zosyn. S. She has a chronic sacral decubitus ulcer that was evaluated by general surgery. She is in the interim had a CT scan performed which shows a 3.4 x 1.2 cm rim-enhancing fluid collection that is superficial and near the sacrum concerning for abscess with potential gas.  MRI showing osteo in the sacrum and delineating there is no drainable abscess and that the presacral lesion is a 5  cm presacral mass most likely a benign sacral myelolipoma.  69 year old female presenting with septic shock and possible sacral abscess in the setting of a chronic sacral wound and possible UTI.  #Sacral Osteomyelitis #Septic shock (resolved) #Chronic sacral decubitus wound #Sacral myeolipoma #Possible UTI. TREATED.  UA with large LE but nitrite negative 2/3- Patient stable overnight, MRI performed found  no drainable abscess but small focus of sacral osteomyelitis, deep lesion is likely benign myeolipoma. 2/2- Successfully weaned from levophed and restarted home metoprolol and MAP remains stable, patient complaining primarily of pain from sacral wound, without urinary sx. Pt seen by ID who recommended surgery see patient and send abscess for culture. Surgery was consulted previously and described the sacral wound as healthy appearing without evidence of abscess on exam, and therefore nothing to culture. ID also rec 6 weeks antibiotic therapy for possible osteomyelitis. Patient complained of cough related to eating for past few months. Patient is responding well to current therapy with vanc/zosyn.  -Transferred from ICU to medical floor on 04/06/19 -Plan for abx tx for osteomyelitis per ID recs -Repeat cultures ordered  04/07/19: Pt has remained stable.  Tolerating PICC line and IV zosyn.  Stable to DC home with home health services for OPAT home IV antibiotics as ordered by ID.   Chronic issues: #Chronic A. Fib: On Coumadin at home, INR therapeutic at 2.2. HR stable. Warfarin restarted.  #Depression/anxiety: Continue home Abilify, Zoloft, Buspar, and low-dose Xanax  #Restless leg syndrome: Continue pramipexole  #Diabetes: On Lantus 40 units daily at home -Sliding-scale insulin -Lantus decreased to 25 units as patient had not been eating well.   ID recommendations 2/3/21Dr. Tommy Medal   1.  Sacral osteomyelitis: Likely polymicrobial but we will take away anti-MRSA activity since we have not seen MRSA on her PCR's.  I will plan on using Zosyn which should be active against the organisms that are at least colonizing her urine and might potentially be involved in this wound near her sacrum and the bone.  We will plan on at least a 6-week course of Zosyn that can be given as a continuous infusion.  Discharge Diagnoses:  Principal Problem:   Abscess of back Active Problems:   Sepsis  ([place])   Pressure injury of skin   Discharge Instructions: Discharge Instructions    AMB referral to wound care center   Complete by: As directed    Home infusion instructions   Complete by: As directed    Instructions: Flushing of vascular access device: 0.9% NaCl pre/post medication administration and prn patency; Heparin 100 u/ml, 41m for implanted ports and Heparin 10u/ml, 577mfor all other central venous catheters.     Allergies as of 04/07/2019      Reactions   Bee Venom Hives   Takes benadryl   Other Other (See Comments)   Raw foods with seeds give her "boils".  Avoids raw strawberries, blueberries. Tolerates cooked fruits, tomato sauce, bread/grains with seeds. [facility] Berries with seeds Allergy to glutamine-[facility]qu[facility]sel[facility]rom [facility]   Gluten Meal Other (See Comments)   Per MAR   Alteplase Rash   Cefepime Rash   Fentanyl Other (See Comments)   Hallucinations, syncope     Metformin And Related Diarrhea      Medication List    STOP taking these medications   lisinopril 10 MG tablet Commonly known as: ZESTRIL   tiZANidine 4 MG tablet Commonly known as: ZANAFLEX     TAKE these medications   acetaminophen 500 MG  tablet Commonly known as: TYLENOL Take 1,000 mg by mouth 3 (three) times daily.   ALPRAZolam 0.5 MG tablet Commonly known as: XANAX Take 0.25 mg by mouth 3 (three) times daily. What changed: Another medication with the same name was removed. Continue taking this medication, and follow the directions you see here.   ARIPiprazole 10 MG tablet Commonly known as: ABILIFY Take 10 mg by mouth daily.   Basaglar KwikPen 100 UNIT/ML Sopn Inject 0.25 mLs (25 Units total) into the skin at bedtime. What changed: how much to take   busPIRone 10 MG tablet Commonly known as: BUSPAR Take 10 mg by mouth 2 (two) times daily.   docusate sodium 100 MG capsule Commonly known as: COLACE Take 100 mg by mouth daily as needed for mild constipation.    ezetimibe 10 MG tablet Commonly known as: ZETIA Take 10 mg by mouth at bedtime.   ferrous sulfate 325 (65 FE) MG tablet Take 325 mg by mouth daily with breakfast.   furosemide 20 MG tablet Commonly known as: LASIX Take 1 tablet (20 mg total) by mouth daily. Start taking on: April 13, 2019 What changed: These instructions start on April 13, 2019. If you are unsure what to do until then, ask your doctor or other care provider.   gabapentin 300 MG capsule Commonly known as: NEURONTIN Take 1 capsule (300 mg total) by mouth 3 (three) times daily.   insulin regular 100 units/mL injection Commonly known as: NOVOLIN R Inject 0-12 Units into the skin 3 (three) times daily before meals. 0-200 0 units 201-250 6 units 251-300 8 units 301-350 10 units 351-400 12 units Greater than 400 call MD   Jantoven 5 MG tablet Generic drug: warfarin Take 5 mg by mouth See admin instructions. Take as directed by MD per weekly levels   Jantoven 2 MG tablet Generic drug: warfarin Take 2 mg by mouth See admin instructions. Take as directed by MD per weekly levels   warfarin 1 MG tablet Commonly known as: COUMADIN Take 1 mg by mouth See admin instructions. Take as directed by MD per weekly levels   Jantoven 3 MG tablet Generic drug: warfarin Take 3 mg by mouth See admin instructions. Take as directed by MD per weekly levels   lidocaine 5 % Commonly known as: LIDODERM Place 1 patch onto the skin daily as needed (right hip pain). Remove & Discard patch within 12 hours or as directed by MD   loperamide 2 MG tablet Commonly known as: IMODIUM A-D Take 2 mg by mouth 4 (four) times daily as needed for diarrhea or loose stools.   magnesium oxide 400 MG tablet Commonly known as: MAG-OX Take 1 tablet (400 mg total) by mouth 3 (three) times daily. ALSO TAKE A EXTRA 400 MG TABLET ON THE DAYS YOU TAKE ZAROXOLYN TABLET What changed: additional instructions   metoprolol succinate 25 MG 24 hr  tablet Commonly known as: TOPROL-XL Take 1 tablet (25 mg total) by mouth daily. What changed: how much to take   omeprazole 20 MG capsule Commonly known as: PRILOSEC Take 20 mg by mouth daily as needed (acid reflux/indigestion).   oxyCODONE 5 MG immediate release tablet Commonly known as: Oxy IR/ROXICODONE Take 1 tablet (5 mg total) by mouth every 6 (six) hours as needed for severe pain. What changed:   when to take this  reasons to take this   piperacillin-tazobactam  IVPB Commonly known as: ZOSYN Inject 13.5 g into the vein daily. as a continuous infusion.  Indication:  Sacral osteomyelitis Last Day of Therapy:  05/13/19 Labs - Once weekly:  CBC/D and BMP, Labs - Every other week:  ESR and CRP   potassium chloride SA 20 MEQ tablet Commonly known as: KLOR-CON Take 1 tablet (20 mEq total) by mouth daily. TAKE AN EXTRA 45 MEQ ON THE DAYS YOU TAKE ZAROXLYN Start taking on: April 13, 2019 What changed:   how much to take  These instructions start on April 13, 2019. If you are unsure what to do until then, ask your doctor or other care provider.   pramipexole 0.125 MG tablet Commonly known as: MIRAPEX Take 0.125 mg by mouth at bedtime.   sertraline 100 MG tablet Commonly known as: ZOLOFT Take 100 mg by mouth at bedtime.   spironolactone 25 MG tablet Commonly known as: ALDACTONE Take 0.5 tablets (12.5 mg total) by mouth daily. Start taking on: April 07, 760   Trulicity 1.5 UQ/3.3HL Sopn Generic drug: Dulaglutide Inject 1.5 mg into the skin every Saturday.            Home Infusion Instuctions  (From admission, onward)         Start     Ordered   04/07/19 0000  Home infusion instructions    Question:  Instructions  Answer:  Flushing of vascular access device: 0.9% NaCl pre/post medication administration and prn patency; Heparin 100 u/ml, 63m for implanted ports and Heparin 10u/ml, 511mfor all other central venous catheters.   04/07/19 1121          Follow-up Information    O'Buch, Greta, PA-C. Schedule an appointment as soon as possible for a visit in 1 week(s).   Specialty: Internal Medicine Why: Hospital Follow Up  Contact information: [address] [phone]        VaTommy MedalCoLavell IslamMD. Go on 05/04/2019.   Specialty: Infectious Diseases Why: at 10 am  Contact information: [phone]        Allergies  Allergen Reactions  . Bee Venom Hives    Takes benadryl  . Other Other (See Comments)    Raw foods with seeds give her "boils".  Avoids raw strawberries, blueberries. Tolerates cooked fruits, tomato sauce, bread/grains with seeds. [facility] Berries with seeds Allergy to glutamine-[facility]querc[facility]selen[facility]rom [facility]26/[facility]  . Gluten Meal Other (See Comments)    Per MAR  . Alteplase Rash  . Cefepime Rash  . Fentanyl Other (See Comments)    Hallucinations, syncope    . Metformin And Related Diarrhea   Allergies as of 04/07/2019      Reactions   Bee Venom Hives   Takes benadryl   Other Other (See Comments)   Raw foods with seeds give her "boils".  Avoids raw strawberries, blueberries. Tolerates cooked fruits, tomato sauce, bread/grains with seeds. [facility] Berries with seeds Allergy to glut[facility]selen[facility]rom [facility]   Gluten Meal Other (See Comments)   Per MAR   Alteplase Rash   Cefepime Rash   Fentanyl Other (See Comments)   Hallucinations, syncope     Metformin And Related Diarrhea      Medication List    STOP taking these medications   lisinopril 10 MG tablet Commonly known as: ZESTRIL   tiZANidine 4 MG tablet Commonly known as: ZANAFLEX     TAKE these medications   acetaminophen 500 MG tablet Commonly known as: TYLENOL Take 1,000 mg by mouth 3 (three)  times daily.   ALPRAZolam 0.5 MG tablet Commonly known as: XANAX Take 0.25 mg by mouth 3 (three) times daily. What changed: Another  medication with the same name was removed. Continue taking this medication, and follow the directions you see here.   ARIPiprazole 10 MG tablet Commonly known as: ABILIFY Take 10 mg by mouth daily.   Basaglar KwikPen 100 UNIT/ML Sopn Inject 0.25 mLs (25 Units total) into the skin at bedtime. What changed: how much to take   busPIRone 10 MG tablet Commonly known as: BUSPAR Take 10 mg by mouth 2 (two) times daily.   docusate sodium 100 MG capsule Commonly known as: COLACE Take 100 mg by mouth daily as needed for mild constipation.   ezetimibe 10 MG tablet Commonly known as: ZETIA Take 10 mg by mouth at bedtime.   ferrous sulfate 325 (65 FE) MG tablet Take 325 mg by mouth daily with breakfast.   furosemide 20 MG tablet Commonly known as: LASIX Take 1 tablet (20 mg total) by mouth daily. Start taking on: April 13, 2019 What changed: These instructions start on April 13, 2019. If you are unsure what to do until then, ask your doctor or other care provider.   gabapentin 300 MG capsule Commonly known as: NEURONTIN Take 1 capsule (300 mg total) by mouth 3 (three) times daily.   insulin regular 100 units/mL injection Commonly known as: NOVOLIN R Inject 0-12 Units into the skin 3 (three) times daily before meals. 0-200 0 units 201-250 6 units 251-300 8 units 301-350 10 units 351-400 12 units Greater than 400 call MD   Jantoven 5 MG tablet Generic drug: warfarin Take 5 mg by mouth See admin instructions. Take as directed by MD per weekly levels   Jantoven 2 MG tablet Generic drug: warfarin Take 2 mg by mouth See admin instructions. Take as directed by MD per weekly levels   warfarin 1 MG tablet Commonly known as: COUMADIN Take 1 mg by mouth See admin instructions. Take as directed by MD per weekly levels   Jantoven 3 MG tablet Generic drug: warfarin Take 3 mg by mouth See admin instructions. Take as directed by MD per weekly levels   lidocaine 5 % Commonly  known as: LIDODERM Place 1 patch onto the skin daily as needed (right hip pain). Remove & Discard patch within 12 hours or as directed by MD   loperamide 2 MG tablet Commonly known as: IMODIUM A-D Take 2 mg by mouth 4 (four) times daily as needed for diarrhea or loose stools.   magnesium oxide 400 MG tablet Commonly known as: MAG-OX Take 1 tablet (400 mg total) by mouth 3 (three) times daily. ALSO TAKE A EXTRA 400 MG TABLET ON THE DAYS YOU TAKE ZAROXOLYN TABLET What changed: additional instructions   metoprolol succinate 25 MG 24 hr tablet Commonly known as: TOPROL-XL Take 1 tablet (25 mg total) by mouth daily. What changed: how much to take   omeprazole 20 MG capsule Commonly known as: PRILOSEC Take 20 mg by mouth daily as needed (acid reflux/indigestion).   oxyCODONE 5 MG immediate release tablet Commonly known as: Oxy IR/ROXICODONE Take 1 tablet (5 mg total) by mouth every 6 (six) hours as needed for severe pain. What changed:   when to take this  reasons to take this   piperacillin-tazobactam  IVPB Commonly known as: ZOSYN Inject 13.5 g into the vein daily. as a continuous infusion. Indication:  Sacral osteomyelitis Last Day of Therapy:  05/13/19 Labs -  Once weekly:  CBC/D and BMP, Labs - Every other week:  ESR and CRP   potassium chloride SA 20 MEQ tablet Commonly known as: KLOR-CON Take 1 tablet (20 mEq total) by mouth daily. TAKE AN EXTRA 59 MEQ ON THE DAYS YOU TAKE ZAROXLYN Start taking on: April 13, 2019 What changed:   how much to take  These instructions start on April 13, 2019. If you are unsure what to do until then, ask your doctor or other care provider.   pramipexole 0.125 MG tablet Commonly known as: MIRAPEX Take 0.125 mg by mouth at bedtime.   sertraline 100 MG tablet Commonly known as: ZOLOFT Take 100 mg by mouth at bedtime.   spironolactone 25 MG tablet Commonly known as: ALDACTONE Take 0.5 tablets (12.5 mg total) by mouth  daily. Start taking on: April 07, 8500   Trulicity 1.5 DX/4.1OI Sopn Generic drug: Dulaglutide Inject 1.5 mg into the skin every Saturday.            Home Infusion Instuctions  (From admission, onward)         Start     Ordered   04/07/19 0000  Home infusion instructions    Question:  Instructions  Answer:  Flushing of vascular access device: 0.9% NaCl pre/post medication administration and prn patency; Heparin 100 u/ml, 96m for implanted ports and Heparin 10u/ml, 585mfor all other central venous catheters.   04/07/19 1121          Procedures/Studies: CT ABDOMEN PELVIS W CONTRAST  Result Date: 04/03/2019 CLINICAL DATA:  Abdominal pain, diarrhea, sepsis EXAM: CT ABDOMEN AND PELVIS WITH CONTRAST TECHNIQUE: Multidetector CT imaging of the abdomen and pelvis was performed using the standard protocol following bolus administration of intravenous contrast. CONTRAST:  12541mMNIPAQUE IOHEXOL 300 MG/ML  SOLN COMPARISON:  CT abdomen pelvis 01/28/2019, CT abdomen pelvis 09/03/2017 FINDINGS: Lower chest: Lung bases are clear. Borderline cardiomegaly. Dense mitral annular calcifications. Coronary artery calcifications are present. No pericardial effusion. Hepatobiliary: No focal liver abnormality is seen. Patient is post cholecystectomy. Slight prominence of the biliary tree likely related to reservoir effect. No calcified intraductal gallstones. Pancreas: Unremarkable. No pancreatic ductal dilatation or surrounding inflammatory changes. Spleen: Mild splenomegaly. No focal splenic lesions. Adrenals/Urinary Tract: Normal adrenal glands. There is bilateral perinephric stranding, nonspecific. Multiple bilateral fluid attenuation cysts are seen in both kidneys, largest is a bilobed cysts in the left upper pole measuring 2.4 cm. Bilateral cortical scarring is noted as well. There is mild right hydroureteronephrosis without visible obstructing calculus though the distal ureter and ureterovesicular  junction is largely obscured by streak artifact from patient's right hip prosthesis. No left urinary tract dilatation or calcification. Question some mild faint perivesicular stranding. Stomach/Bowel: Distal esophagus, stomach and duodenal sweep are unremarkable. No small bowel wall thickening or dilatation. No evidence of obstruction. A normal appendix is visualized. Scattered colonic diverticula without focal pericolonic inflammation to suggest diverticulitis. Vascular/Lymphatic: Atherosclerotic plaque within the normal caliber aorta. No suspicious or enlarged lymph nodes in the included lymphatic chains. Reproductive: Uterus is surgically absent. No concerning adnexal lesions. Other: No abdominopelvic free fluid or air. Right abdominal wall laxity. Circumferential body wall edema. There is focal rim enhancing fluid collection superficial to the sacrum measuring 3.4 x 1.2 cm (3/68). Small punctate focus of gas is noted as well (7/81). Additionally, there is an indeterminate presacral structure, either fluid collection or intermediate attenuation mass measuring 50 Hounsfield units. This is not significantly changed in size or appearance from 11/78/67/6720ough certainly  reflect some interval increase in size measuring up to 4 cm on today's exam, previously 3 cm on study from September 03, 2017. Musculoskeletal: There is right abdominal wall laxity with notable atrophy of the right rectus sheath. Multilevel degenerative changes are present in the spine including bony fusion across many of the lower lumbar levels and severe interspinous arthrosis. Abrupt angulation of the sacrococcygeal junction is noted and is in close proximity to presacral collection, clear these are related findings. IMPRESSION: 1. 3.4 x 1.2 cm rim enhancing fluid collection superficial to the sacrum is suspicious for abscess. Punctate foci is of adjacent gas could be seen with decubitus ulceration but more aggressive soft tissue infection should be  excluded on a clinical basis. 2. Indeterminate presacral lesion, increasing in size since 2019. Could be related to the adjacent fracture of the sacrococcygeal junction though overall remains indeterminate. 3. Bladder wall thickening with some urothelial thickening particular on the right concerning for potential ascending urinary tract infection. No visualized urolithiasis though distal evaluation is limited by streak from right hip arthroplasty. 4. Circumferential body wall edema. Correlate for features of anasarca. 5.  Aortic Atherosclerosis (ICD10-I70.0). And Critical Value/emergent results were called by telephone at the time of interpretation on 04/03/2019 at 1:45 am to provider [facility] , who verbally acknowledged these results. Electronically Signed   By: Lovena Le M.D.   On: 04/03/2019 01:45   MR SACRUM SI JOINTS W WO CONTRAST  Result Date: 04/06/2019 CLINICAL DATA:  Chronic sacral decubitus ulcer. EXAM: MRI PELVIS WITHOUT AND WITH CONTRAST TECHNIQUE: Multiplanar multisequence MR imaging of the pelvis was performed both before and after administration of intravenous contrast. CONTRAST:  73m GADAVIST GADOBUTROL 1 MMOL/ML IV SOLN COMPARISON:  CT scan 04/03/2019 FINDINGS: There is a deep sacral decubitus ulcer extending down adjacent to the lower sacral segments. Enhancing inflammatory granulation tissue around the ulcer but no abscess. Mild surrounding cellulitis. No evidence of myofasciitis involving the atrophied gluteal muscles. Small focus of abnormal T2 signal intensity and subsequent enhancement in the distal sacral segment consistent with osteomyelitis. Again noted is a 5 cm presacral mass. This has areas of fatty signal intensity and is most likely a benign sacral myelolipoma. No findings for septic arthritis involving the SI joints. Significant artifact from a right hip prosthesis. No intrapelvic abscess is identified. Moderate to large amount of stool is noted in the rectum. IMPRESSION:  1. Deep sacral decubitus ulcer extending down to the lower sacral segments. 2. Small focus of osteomyelitis involving the distal sacral segment. 3. No drainable soft tissue abscess or intrapelvic abscess. 4. 5 cm presacral mass most likely a benign sacral myelolipoma. Electronically Signed   By: PMarijo SanesM.D.   On: 04/06/2019 08:10   DG Chest Port 1 View  Result Date: 04/06/2019 CLINICAL DATA:  Status post PICC placement. EXAM: PORTABLE CHEST 1 VIEW COMPARISON:  Single-view of the chest 04/02/2019. FINDINGS: Right PICC is in place with the tip projecting in the lower superior vena cava. Lungs are clear. No pneumothorax or pleural effusion. Cardiomegaly noted. The patient is status post right shoulder surgery. IMPRESSION: Tip of right PICC projects in the lower superior vena cava. Cardiomegaly. Atherosclerosis. Electronically Signed   By: TInge RiseM.D.   On: 04/06/2019 10:53   DG Chest Port 1 View  Result Date: 04/02/2019 CLINICAL DATA:  Cough.  Hypoxia. EXAM: PORTABLE CHEST 1 VIEW COMPARISON:  01/28/2019 FINDINGS: The heart size is enlarged. Aortic calcifications are noted. There is no pneumothorax. No large  pleural effusion. No focal infiltrate. Degenerative changes are noted of both glenohumeral joints and both AC joints. There is no obvious acute osseous abnormality. IMPRESSION: No active disease. Electronically Signed   By: Constance Holster M.D.   On: 04/02/2019 22:37   CT HEAD CODE STROKE WO CONTRAST  Result Date: 04/02/2019 CLINICAL DATA:  Code stroke. 69 year old female with abnormal speech. EXAM: CT HEAD WITHOUT CONTRAST TECHNIQUE: Contiguous axial images were obtained from the base of the skull through the vertex without intravenous contrast. COMPARISON:  Brain MRI 06/18/2011. Head CT 09/24/2017. FINDINGS: Brain: Chronic partially empty sella. Stable cerebral volume. No midline shift, ventriculomegaly, mass effect, evidence of mass lesion, intracranial hemorrhage or evidence of  cortically based acute infarction. Mild for age scattered white matter hypodensity appears stable. No cortical encephalomalacia identified. Vascular: Calcified atherosclerosis at the skull base. No suspicious intracranial vascular hyperdensity. Skull: No acute osseous abnormality identified. Sinuses/Orbits: Visualized paranasal sinuses and mastoids are stable and well pneumatized. Other: No acute orbit or scalp soft tissue finding. ASPECTS [facility] Stroke Program Early CT Score) Total score (0-10 with 10 being normal): 10 IMPRESSION: Stable since 2019 and largely negative for age noncontrast CT appearance of the brain. ASPECTS 10. These results were communicated to Dr. Lorraine Lax at 9:16 pm on 1/30/2021by text page via the [facility] messaging system. Electronically Signed   By: Genevie Ann M.D.   On: 04/02/2019 21:17   [place] EKG SITE RITE  Result Date: 04/04/2019 If Site Rite image not attached, placement could not be confirmed due to current cardiac rhythm.    Subjective: Pt without complaints.   Pt agreeable to home health services and home IV antibiotics.   Discharge Exam: Vitals:   04/06/19 2150 04/07/19 0416  BP: 111/67 (!) 110/56  Pulse: 90 74  Resp: 17 17  Temp: 98.4 F (36.9 C) 98.2 F (36.8 C)  SpO2: 94% 95%   Vitals:   04/06/19 1100 04/06/19 1241 04/06/19 2150 04/07/19 0416  BP: 112/67 107/61 111/67 (!) 110/56  Pulse:  100 90 74  Resp: (!) '25  17 17  '$ Temp:  99.8 F (37.7 C) 98.4 F (36.9 C) 98.2 F (36.8 C)  TempSrc:  Oral Oral Oral  SpO2:  100% 94% 95%  Weight:      Height:       General: Pt is alert, awake, not in acute distress Cardiovascular: irregularly irregular S1/S2 +, no rubs, no gallops Respiratory: CTA bilaterally, no wheezing, no rhonchi Abdominal: Soft, NT, ND, bowel sounds + Extremities:  no cyanosis   The results of significant diagnostics from this hospitalization (including imaging, microbiology, ancillary and laboratory) are listed below for reference.      Microbiology: Recent Results (from the past 240 hour(s))  Blood culture (routine x 2)     Status: Abnormal   Collection Time: 04/02/19  9:35 PM   Specimen: BLOOD  Result Value Ref Range Status   Specimen Description BLOOD RIGHT ARM  Final   Special Requests   Final    BOTTLES DRAWN AEROBIC AND ANAEROBIC Blood Culture adequate volume   Culture  Setup Time   Final    GRAM POSITIVE COCCI IN CLUSTERS AEROBIC BOTTLE ONLY CRITICAL RESULT CALLED TO, READ BACK BY AND VERIFIED WITH: PHARMD JEREMY F. [id number]     Culture (A)  Final    MICROCOCCUS LUTEUS/LYLAE Standardized susceptibility testing for this organism is not available. Performed at [facility], [place] [address] [place], [place] [address]    Report Status 04/06/2019  FINAL  Final  Blood culture (routine x 2)     Status: None   Collection Time: 04/02/19  9:35 PM   Specimen: BLOOD  Result Value Ref Range Status   Specimen Description BLOOD RIGHT ARM  Final   Special Requests   Final    BOTTLES DRAWN AEROBIC AND ANAEROBIC Blood Culture results may not be optimal due to an inadequate volume of blood received in culture bottles   Culture   Final    NO GROWTH 5 DAYS Performed at [facility], [place] [address] [place], [place] [address]    Report Status 04/07/2019 FINAL  Final  Respiratory Panel by RT PCR (Flu A&B, Covid) - Nasopharyngeal Swab     Status: None   Collection Time: 04/02/19 11:36 PM   Specimen: Nasopharyngeal Swab  Result Value Ref Range Status   SARS Coronavirus 2 by RT PCR NEGATIVE NEGATIVE Final    Comment: (NOTE) SARS-CoV-2 target nucleic acids are NOT DETECTED. The SARS-CoV-2 RNA is generally detectable in upper respiratoy specimens during the acute phase of infection. The lowest concentration of SARS-CoV-2 viral copies this assay can detect is 131 copies/mL. A negative result does not preclude SARS-Cov-2 infection and should not be used as the sole basis for treatment or other patient management  decisions. A negative result may occur with  improper specimen collection/handling, submission of specimen other than nasopharyngeal swab, presence of viral mutation(s) within the areas targeted by this assay, and inadequate number of viral copies (<131 copies/mL). A negative result must be combined with clinical observations, patient history, and epidemiological information. The expected result is Negative. Fact Sheet for Patients:  PinkCheek.be Fact Sheet for Healthcare Providers:  GravelBags.it This test is not yet ap proved or cleared by the [place] FDA and  has been authorized for detection and/or diagnosis of SARS-CoV-2 by FDA under an Emergency Use Authorization (EUA). This EUA will remain  in effect (meaning this test can be used) for the duration of the COVID-19 declaration under Section 564(b)(1) of the Act, 21 U.S.C. section 360bbb-3(b)(1), unless the authorization is terminated or revoked sooner.    Influenza A by PCR NEGATIVE NEGATIVE Final   Influenza B by PCR NEGATIVE NEGATIVE Final    Comment: (NOTE) The Xpert Xpress SARS-CoV-2/FLU/RSV assay is intended as an aid in  the diagnosis of influenza from Nasopharyngeal swab specimens and  should not be used as a sole basis for treatment. Nasal washings and  aspirates are unacceptable for Xpert Xpress SARS-CoV-2/FLU/RSV  testing. Fact Sheet for Patients: PinkCheek.be Fact Sheet for Healthcare Providers: GravelBags.it This test is not yet approved or cleared by the [place] FDA and  has been authorized for detection and/or diagnosis of SARS-CoV-2 by  FDA under an Emergency Use Authorization (EUA). This EUA will remain  in effect (meaning this test can be used) for the duration of the  Covid-19 declaration under Section 564(b)(1) of the Act, 21  U.S.C. section 360bbb-3(b)(1), unless the authorization  is  terminated or revoked. Performed at [facility], [place] [address] [place], [place] [address]   Urine culture     Status: Abnormal   Collection Time: 04/02/19 11:43 PM   Specimen: In/Out Cath Urine  Result Value Ref Range Status   Specimen Description IN/OUT CATH URINE  Final   Special Requests STERILE CONTAINER  Final   Culture (A)  Final    >=100,000 COLONIES/mL KLEBSIELLA PNEUMONIAE 10,000 COLONIES/mL PSEUDOMONAS AERUGINOSA Confirmed Extended Spectrum Beta-Lactamase Producer (ESBL).  In bloodstream infections from ESBL organisms, carbapenems are preferred over piperacillin/tazobactam. They are shown to have a lower risk of mortality. FOR KLEBSIELLA PNEUMONIAE Performed at [facility], [place] [address], [place], [place] [address]    Report Status 04/05/2019 FINAL  Final   Organism ID, Bacteria KLEBSIELLA PNEUMONIAE (A)  Final   Organism ID, Bacteria PSEUDOMONAS AERUGINOSA (A)  Final      Susceptibility   Klebsiella pneumoniae - MIC*    AMPICILLIN >=32 RESISTANT Resistant     CEFAZOLIN >=64 RESISTANT Resistant     CEFTRIAXONE >=64 RESISTANT Resistant     CIPROFLOXACIN 1 SENSITIVE Sensitive     GENTAMICIN >=16 RESISTANT Resistant     IMIPENEM 0.5 SENSITIVE Sensitive     NITROFURANTOIN 64 INTERMEDIATE Intermediate     TRIMETH/SULFA >=320 RESISTANT Resistant     AMPICILLIN/SULBACTAM >=32 RESISTANT Resistant     PIP/TAZO 16 SENSITIVE Sensitive     * >=100,000 COLONIES/mL KLEBSIELLA PNEUMONIAE   Pseudomonas aeruginosa - MIC*    CEFTAZIDIME 8 SENSITIVE Sensitive     CIPROFLOXACIN 0.5 SENSITIVE Sensitive     GENTAMICIN >=16 RESISTANT Resistant     IMIPENEM 2 SENSITIVE Sensitive     PIP/TAZO 16 SENSITIVE Sensitive     CEFEPIME 4 SENSITIVE Sensitive     * 10,000 COLONIES/mL PSEUDOMONAS AERUGINOSA  MRSA PCR Screening     Status: None   Collection Time: 04/03/19  6:01 AM   Specimen: Nasopharyngeal  Result Value Ref Range Status   MRSA by PCR NEGATIVE NEGATIVE Final     Comment:        The GeneXpert MRSA Assay (FDA approved for NASAL specimens only), is one component of a comprehensive MRSA colonization surveillance program. It is not intended to diagnose MRSA infection nor to guide or monitor treatment for MRSA infections. Performed at [facility], [place] [address], [place], [place] [address]   Culture, blood (Routine X 2) w Reflex to ID Panel     Status: None (Preliminary result)   Collection Time: 04/05/19 10:19 AM   Specimen: BLOOD RIGHT ARM  Result Value Ref Range Status   Specimen Description BLOOD RIGHT ARM  Final   Special Requests   Final    BOTTLES DRAWN AEROBIC ONLY Blood Culture adequate volume   Culture   Final    NO GROWTH 2 DAYS Performed at [facility], [address], [place], [place] [address]    Report Status PENDING  Incomplete  Culture, blood (Routine X 2) w Reflex to ID Panel     Status: None (Preliminary result)   Collection Time: 04/05/19 10:25 AM   Specimen: BLOOD LEFT ARM  Result Value Ref Range Status   Specimen Description BLOOD LEFT ARM  Final   Special Requests   Final    BOTTLES DRAWN AEROBIC ONLY Blood Culture adequate volume   Culture   Final    NO GROWTH 2 DAYS Performed at [facility], [place] [address] [place],  [address]    Report Status PENDING  Incomplete     Labs: BNP (last 3 results) No results for input(s): BNP in the last 8760 hours. Basic Metabolic Panel: Recent Labs  Lab 04/02/19 2100 04/02/19 2100 04/02/19 2112 04/02/19 2112 04/02/19 2142 04/03/19 0401 04/04/19 0338 04/05/19 0240 04/06/19 0053 04/07/19 0434  NA 136   < > 137   < > 138 138 135 136 136  --   K 3.3*   < > 3.3*   < >  3.1* 3.1* 3.3* 3.4* 3.7  --   CL 98   < > 97*  --   --  103 97* 100 100  --   CO2 25  --   --   --   --  '23 26 26 26  '$ --   GLUCOSE 284*   < > 276*  --   --  208* 152* 149* 162*  --   BUN 17   < > 17  --   --  '14 10 11 11  '$ --   CREATININE 1.14*   < > 0.90  --   --  0.92 0.87  0.82 0.80  --   CALCIUM 9.1  --   --   --   --  8.3* 9.1 8.8* 8.8*  --   MG  --   --   --   --   --  1.6* 1.7 1.5* 1.7 1.6*   < > = values in this interval not displayed.   Liver Function Tests: Recent Labs  Lab 04/02/19 2100  AST 26  ALT 9  ALKPHOS 81  BILITOT 1.5*  PROT 7.4  ALBUMIN 3.7   No results for input(s): LIPASE, AMYLASE in the last 168 hours. Recent Labs  Lab 04/02/19 2135  AMMONIA 37*   CBC: Recent Labs  Lab 04/02/19 2100 04/02/19 2112 04/02/19 2142 04/03/19 0401 04/04/19 0338  WBC 12.5*  --   --  17.0* 7.1  NEUTROABS 11.7*  --   --   --   --   HGB 11.9* 13.9 13.6 11.5* 10.9*  HCT 40.4 41.0 40.0 39.3 37.3  MCV 79.5*  --   --  79.4* 79.2*  PLT 207  --   --  264 176   Cardiac Enzymes: No results for input(s): CKTOTAL, CKMB, CKMBINDEX, TROPONINI in the last 168 hours. BNP: Invalid input(s): POCBNP CBG: Recent Labs  Lab 04/05/19 2201 04/06/19 0842 04/06/19 1724 04/06/19 2144 04/07/19 0751  GLUCAP 152* 177* 118* 145* 146*   D-Dimer No results for input(s): DDIMER in the last 72 hours. Hgb A1c No results for input(s): HGBA1C in the last 72 hours. Lipid Profile No results for input(s): CHOL, HDL, LDLCALC, TRIG, CHOLHDL, LDLDIRECT in the last 72 hours. Thyroid function studies No results for input(s): TSH, T4TOTAL, T3FREE, THYROIDAB in the last 72 hours.  Invalid input(s): FREET3 Anemia work up No results for input(s): VITAMINB12, FOLATE, FERRITIN, TIBC, IRON, RETICCTPCT in the last 72 hours. Urinalysis    Component Value Date/Time   COLORURINE YELLOW 04/02/2019 2343   APPEARANCEUR CLOUDY (A) 04/02/2019 2343   LABSPEC 1.014 04/02/2019 2343   PHURINE 5.0 04/02/2019 2343   GLUCOSEU NEGATIVE 04/02/2019 2343   HGBUR SMALL (A) 04/02/2019 2343   BILIRUBINUR NEGATIVE 04/02/2019 [place] 04/02/2019 2343   PROTEINUR NEGATIVE 04/02/2019 2343   UROBILINOGEN 1.0 09/06/2010 1130   NITRITE NEGATIVE 04/02/2019 2343   LEUKOCYTESUR  LARGE (A) 04/02/2019 2343   Sepsis Labs Invalid input(s): PROCALCITONIN,  WBC,  LACTICIDVEN Microbiology Recent Results (from the past 240 hour(s))  Blood culture (routine x 2)     Status: Abnormal   Collection Time: 04/02/19  9:35 PM   Specimen: BLOOD  Result Value Ref Range Status   Specimen Description BLOOD RIGHT ARM  Final   Special Requests   Final    BOTTLES DRAWN AEROBIC AND ANAEROBIC Blood Culture adequate volume   Culture  Setup Time   Final    GRAM POSITIVE COCCI IN CLUSTERS  AEROBIC BOTTLE ONLY CRITICAL RESULT CALLED TO, READ BACK BY AND VERIFIED WITH: PHARMD JEREMY F. [id number]     Culture (A)  Final    MICROCOCCUS LUTEUS/LYLAE Standardized susceptibility testing for this organism is not available. Performed at [facility], [place] [address], [place], [place] [address]    Report Status 04/06/2019 FINAL  Final  Blood culture (routine x 2)     Status: None   Collection Time: 04/02/19  9:35 PM   Specimen: BLOOD  Result Value Ref Range Status   Specimen Description BLOOD RIGHT ARM  Final   Special Requests   Final    BOTTLES DRAWN AEROBIC AND ANAEROBIC Blood Culture results may not be optimal due to an inadequate volume of blood received in culture bottles   Culture   Final    NO GROWTH 5 DAYS Performed at [facility], [place] [address], [place], [place] [address]    Report Status 04/07/2019 FINAL  Final  Respiratory Panel by RT PCR (Flu A&B, Covid) - Nasopharyngeal Swab     Status: None   Collection Time: 04/02/19 11:36 PM   Specimen: Nasopharyngeal Swab  Result Value Ref Range Status   SARS Coronavirus 2 by RT PCR NEGATIVE NEGATIVE Final    Comment: (NOTE) SARS-CoV-2 target nucleic acids are NOT DETECTED. The SARS-CoV-2 RNA is generally detectable in upper respiratoy specimens during the acute phase of infection. The lowest concentration of SARS-CoV-2 viral copies this assay can detect is 131 copies/mL. A negative result does not preclude  SARS-Cov-2 infection and should not be used as the sole basis for treatment or other patient management decisions. A negative result may occur with  improper specimen collection/handling, submission of specimen other than nasopharyngeal swab, presence of viral mutation(s) within the areas targeted by this assay, and inadequate number of viral copies (<131 copies/mL). A negative result must be combined with clinical observations, patient history, and epidemiological information. The expected result is Negative. Fact Sheet for Patients:  PinkCheek.be Fact Sheet for Healthcare Providers:  GravelBags.it This test is not yet ap proved or cleared by the [place] FDA and  has been authorized for detection and/or diagnosis of SARS-CoV-2 by FDA under an Emergency Use Authorization (EUA). This EUA will remain  in effect (meaning this test can be used) for the duration of the COVID-19 declaration under Section 564(b)(1) of the Act, 21 U.S.C. section 360bbb-3(b)(1), unless the authorization is terminated or revoked sooner.    Influenza A by PCR NEGATIVE NEGATIVE Final   Influenza B by PCR NEGATIVE NEGATIVE Final    Comment: (NOTE) The Xpert Xpress SARS-CoV-2/FLU/RSV assay is intended as an aid in  the diagnosis of influenza from Nasopharyngeal swab specimens and  should not be used as a sole basis for treatment. Nasal washings and  aspirates are unacceptable for Xpert Xpress SARS-CoV-2/FLU/RSV  testing. Fact Sheet for Patients: PinkCheek.be Fact Sheet for Healthcare Providers: GravelBags.it This test is not yet approved or cleared by the [place] FDA and  has been authorized for detection and/or diagnosis of SARS-CoV-2 by  FDA under an Emergency Use Authorization (EUA). This EUA will remain  in effect (meaning this test can be used) for the duration of the  Covid-19  declaration under Section 564(b)(1) of the Act, 21  U.S.C. section 360bbb-3(b)(1), unless the authorization is  terminated or revoked. Performed at [facility], [place] [address], [place], [place] [address]   Urine culture     Status: Abnormal  Collection Time: 04/02/19 11:43 PM   Specimen: In/Out Cath Urine  Result Value Ref Range Status   Specimen Description IN/OUT CATH URINE  Final   Special Requests STERILE CONTAINER  Final   Culture (A)  Final    >=100,000 COLONIES/mL KLEBSIELLA PNEUMONIAE 10,000 COLONIES/mL PSEUDOMONAS AERUGINOSA Confirmed Extended Spectrum Beta-Lactamase Producer (ESBL).  In bloodstream infections from ESBL organisms, carbapenems are preferred over piperacillin/tazobactam. They are shown to have a lower risk of mortality. FOR KLEBSIELLA PNEUMONIAE Performed at [facility], [place] [address] [place], [place] [address]    Report Status 04/05/2019 FINAL  Final   Organism ID, Bacteria KLEBSIELLA PNEUMONIAE (A)  Final   Organism ID, Bacteria PSEUDOMONAS AERUGINOSA (A)  Final      Susceptibility   Klebsiella pneumoniae - MIC*    AMPICILLIN >=32 RESISTANT Resistant     CEFAZOLIN >=64 RESISTANT Resistant     CEFTRIAXONE >=64 RESISTANT Resistant     CIPROFLOXACIN 1 SENSITIVE Sensitive     GENTAMICIN >=16 RESISTANT Resistant     IMIPENEM 0.5 SENSITIVE Sensitive     NITROFURANTOIN 64 INTERMEDIATE Intermediate     TRIMETH/SULFA >=320 RESISTANT Resistant     AMPICILLIN/SULBACTAM >=32 RESISTANT Resistant     PIP/TAZO 16 SENSITIVE Sensitive     * >=100,000 COLONIES/mL KLEBSIELLA PNEUMONIAE   Pseudomonas aeruginosa - MIC*    CEFTAZIDIME 8 SENSITIVE Sensitive     CIPROFLOXACIN 0.5 SENSITIVE Sensitive     GENTAMICIN >=16 RESISTANT Resistant     IMIPENEM 2 SENSITIVE Sensitive     PIP/TAZO 16 SENSITIVE Sensitive     CEFEPIME 4 SENSITIVE Sensitive     * 10,000 COLONIES/mL PSEUDOMONAS AERUGINOSA  MRSA PCR Screening     Status: None   Collection Time: 04/03/19   6:01 AM   Specimen: Nasopharyngeal  Result Value Ref Range Status   MRSA by PCR NEGATIVE NEGATIVE Final    Comment:        The GeneXpert MRSA Assay (FDA approved for NASAL specimens only), is one component of a comprehensive MRSA colonization surveillance program. It is not intended to diagnose MRSA infection nor to guide or monitor treatment for MRSA infections. Performed at [place] [facility], [place] [address], [place], [place] [address]   Culture, blood (Routine X 2) w Reflex to ID Panel     Status: None (Preliminary result)   Collection Time: 04/05/19 10:19 AM   Specimen: BLOOD RIGHT ARM  Result Value Ref Range Status   Specimen Description BLOOD RIGHT ARM  Final   Special Requests   Final    BOTTLES DRAWN AEROBIC ONLY Blood Culture adequate volume   Culture   Final    NO GROWTH 2 DAYS Performed at [facility], [address] [place], [place] [address]    Report Status PENDING  Incomplete  Culture, blood (Routine X 2) w Reflex to ID Panel     Status: None (Preliminary result)   Collection Time: 04/05/19 10:25 AM   Specimen: BLOOD LEFT ARM  Result Value Ref Range Status   Specimen Description BLOOD LEFT ARM  Final   Special Requests   Final    BOTTLES DRAWN AEROBIC ONLY Blood Culture adequate volume   Culture   Final    NO GROWTH 2 DAYS Performed at [facility], [place] [address], [place],  [address]    Report Status PENDING  Incomplete   Time coordinating discharge: 33 minutes   SIGNED:  Irwin Brakeman, MD  [facility]  04/07/2019, 11:50 AM How to contact the [facility] Attending or Consulting provider [place] or covering provider during after hours [place], for this patient?  1. Check the care team in [facility] and look for a) attending/consulting TR[facility] provider listed and b) the [facility] team listed 2. Log into [email or web address] and use [facility] universal password to access. If you do not have the password, please contact the hospital operator. 3. Locate  the [facility] provider you are looking for under [facility] and page to a number that you can be directly reached. 4. If you still have difficulty reaching the provider, please page the [facility] (Director on Call) for the Hospitalists listed on amion for assistance.

## 2019-04-07 NOTE — TOC Progression Note (Addendum)
Transition of Care [facility]) - Progression Note    Patient Details  Name: Donna [name] MRN: [id number] Date of Birth: 1950-08-24  Transition of Care [facility] CM/SW Contact  Jacalyn Lefevre Edson Snowball, RN Phone Number: 04/07/2019, 1:11 PM  Clinical Narrative:     Spoke to patient at bedside with husband Durenda Hurt on speaker phone.   Discussed going home with home health and PICC and IV ABX vs going to SNF .   Both aware IV ABX is continuous infusion. Pam with [facility] plans on seeing patient in hospital room today between 1630 and 1700 to start infusion. Patient and husband aware HHRN will not make daily visits.   Discussed with husband patient deconditioned. Patient does not feel she is strong enough to go home by private car. Explained hospital can arrange ambulance transport home. Patient and husband in agreement for ambulance. Will place PTAR paperwork on chart. Confirmed address. Will ask Pam to call husband to discuss home infusion.   Spoke to patient husband Broadus John, patient already has a [employer] fast streamer at home.   Expected Discharge Plan: [place] Barriers to Discharge: Continued Medical Work up  Expected Discharge Plan and Services Expected Discharge Plan: [place]   Discharge Planning Services: CM Consult Post Acute Care Choice: [place] arrangements for the past 2 months: Single Family Home Expected Discharge Date: 04/07/19               DME Arranged: N/A         HH Arranged: PT, OT, RN [place] Agency: [place] ([place]) Date HH Agency Contacted: 04/06/19 Time [place]: [id number] Representative spoke with at [place]: [place] ([place]) Interventions    Readmission Risk Interventions No flowsheet data found.

## 2019-04-07 NOTE — Discharge Instructions (Signed)
OPAT Orders Discharge antibiotics:  Zosyn via continuous infusion  Duration:  6 weeks End Date:  05/13/19  [facility] Care Per Protocol:    Labs  weekly while on IV antibiotics: x__ CBC with differential _x_ BMP w GFR/CMP _x_ CRP _x_ ESR   x__ Please pull PIC at completion of IV antibiotics __ Please leave PIC in place until doctor has seen patient or been notified  Fax weekly labs to [phone]   Donna [name] has an appointment on 05/04/2019 at 10 am with Dr. Tommy Medal  The [facility] for Infectious Disease is located in the [facility] at   [address] in [place].  [address], which is located to the left of the elevators.  Phone: [phone]  Fax: [phone]  [email or web address]  She should arrive 15 minutes prior to her appointment.  I will sign off for now please call with further questions.    IMPORTANT INFORMATION: PAY CLOSE ATTENTION   PHYSICIAN DISCHARGE INSTRUCTIONS  Follow with Primary care provider  O'Buch, Greta, PA-C  and other consultants as instructed by your Hospitalist Physician  [place] IF SYMPTOMS COME BACK, WORSEN OR NEW PROBLEM DEVELOPS   Please note: You were cared for by a hospitalist during your hospital stay. Every effort will be made to forward records to your primary care provider.  You can request that your primary care provider send for your hospital records if they have not received them.  Once you are discharged, your primary care physician will handle any further medical issues. Please note that NO REFILLS for any discharge medications will be authorized once you are discharged, as it is imperative that you return to your primary care physician (or establish a relationship with a primary care physician if you do not have one) for your post hospital discharge needs so that they can reassess your need for medications and monitor  your lab values.  Please get a complete blood count and chemistry panel checked by your Primary MD at your next visit, and again as instructed by your Primary MD.  Get Medicines reviewed and adjusted: Please take all your medications with you for your next visit with your Primary MD  Laboratory/radiological data: Please request your Primary MD to go over all hospital tests and procedure/radiological results at the follow up, please ask your primary care provider to get all Hospital records sent to his/her office.  In some cases, they will be blood work, cultures and biopsy results pending at the time of your discharge. Please request that your primary care provider follow up on these results.  If you are diabetic, please bring your blood sugar readings with you to your follow up appointment with primary care.    Please call and make your follow up appointments as soon as possible.    Also Note the following: If you experience worsening of your admission symptoms, develop shortness of breath, life threatening emergency, suicidal or homicidal thoughts you must seek medical attention immediately by calling 911 or calling your MD immediately  if symptoms less severe.  You must read complete instructions/literature along with all the possible adverse reactions/side effects for all the Medicines you take and that have been prescribed to you. Take any new Medicines after you have completely understood and accpet all the possible adverse reactions/side effects.   Do not drive when taking Pain medications or sleeping medications (Benzodiazepines)  Do not take more than prescribed  Pain, Sleep and Anxiety Medications. It is not advisable to combine anxiety,sleep and pain medications without talking with your primary care practitioner  Special Instructions: If you have smoked or chewed Tobacco  in the last 2 yrs please stop smoking, stop any regular Alcohol  and or any Recreational drug use.  Wear  Seat belts while driving.  Do not drive if taking any narcotic, mind altering or controlled substances or recreational drugs or alcohol.

## 2019-04-07 NOTE — TOC Progression Note (Signed)
Transition of Care [facility]) - Progression Note    Patient Details  Name: BRAYLON GILLE MRN: [id number] Date of Birth: February 10, 1951  Transition of Care [facility]) CM/SW Contact  Jacalyn Lefevre Edson [place], RN Phone Number: 04/07/2019, 11:31 AM  Clinical Narrative:     Plan for patient to discharge to home today with PICC and IV ABX . Valerie with [facility] aware. Pam with [facility] will see patient this afternoon.  Expected Discharge Plan: [place] Barriers to Discharge: Continued Medical Work up  Expected Discharge Plan and Services Expected Discharge Plan: [place]   Discharge Planning Services: CM Consult Post Acute Care Choice: [place] arrangements for the past 2 months: Single Family Home Expected Discharge Date: 04/07/19               DME Arranged: N/A         HH Arranged: PT, OT, RN [place] Agency: [place] ([place]) Date HH Agency Contacted: 04/06/19 Time [place]: [id number] Representative spoke with at [place]: [place] ([place]) Interventions    Readmission Risk Interventions No flowsheet data found.

## 2019-04-07 NOTE — Progress Notes (Signed)
Donna [name] for Coumadin Indication: atrial fibrillation  Allergies  Allergen Reactions  . Bee Venom Hives    Takes benadryl  . Other Other (See Comments)    Raw foods with seeds give her "boils".  Avoids raw strawberries, blueberries. Tolerates cooked fruits, tomato sauce, bread/grains with seeds. [facility] 10/17/13 Berries with seeds Allergy to glutamine-C-quercet-selen-brom per [facility] 09/25/17  . Gluten Meal Other (See Comments)    Per MAR  . Alteplase Rash  . Cefepime Rash  . Fentanyl Other (See Comments)    Hallucinations, syncope    . Metformin And Related Diarrhea    Patient Measurements: Height: 5\' 11"  (180.3 cm) Weight: 248 lb 7.3 oz (112.7 kg) IBW/kg (Calculated) : 70.8  Vital Signs: Temp: 98.2 F (36.8 C) (02/04 0416) Temp Source: Oral (02/04 0416) BP: 110/56 (02/04 0416) Pulse Rate: 74 (02/04 0416)  Labs: Recent Labs    04/05/19 0240 04/05/19 1401 04/06/19 0053 04/07/19 0434  LABPROT  --  20.5* 21.5* 19.6*  INR  --  1.8* 1.9* 1.7*  CREATININE 0.82  --  0.80  --     Estimated Creatinine Clearance: 93.1 mL/min (by C-G formula based on SCr of 0.8 mg/dL).   Medical History: Past Medical History:  Diagnosis Date  . CHF (congestive heart failure) ([place])   . Chronic /permanent atrial fibrillation ([place]) 2005   On Warfarin (follwed @ [facility]); Rate controlled - On BB  . Chronic diastolic heart failure, NYHA class 2 ([place])    Updated by A. fib, hypertensive heart disease and obesity; EDP was only 7 by cardiac catheterization in September 2017  . Diabetic lumbosacral plexopathy ([place])   . Fibromyalgia   . Generalized anxiety disorder   . Hypokalemia 09/03/2017  . Incontinence   . Major depressive disorder   . OSA on CPAP   . Restless leg syndrome   . Syncope 09/03/2017  . Type II diabetes mellitus [facility])     Assessment: 69 yo female on chronic Coumadin for afib, admitted 1/30 with sepsis.  Pharmacy  asked to resume anticoagulation with Coumadin (restarted 1/31).  INR 1.7 (<<1.9) today after 3 doses of 7.5mg   PTA dose a bit confusing, I think normally she takes Coumadin 8 mg daily, but it seems pt had elevated INR Monday (4.4) she didn't take any Tuesday, 6 mg Wed, None Thurs, 7 mg Fri and supposed to take 8 Sat, but missed dose because was admitted to the hospital.  Goal of Therapy:  INR 2-3 Monitor platelets by anticoagulation protocol: Yes   Plan:  1. Coumadin 10 mg po x 1 tonight  2. Daily PT/INR.  Onofrio Klemp A. Levada Dy, PharmD, BCPS, FNKF Clinical Pharmacist [place] Please utilize Amion for appropriate phone number to reach the unit pharmacist ([place])

## 2019-04-07 NOTE — Evaluation (Signed)
Physical Therapy Evaluation Patient Details Name: Donna [name] MRN: [id number] DOB: [birth year] Today's Date: 04/07/2019   History of Present Illness  Pt is a 69 y/o female admitted secondary to septic shock from abscess on back and UTI. PMH includes CHF, a fib, OSA on CPAP, DM, fibromyalgia, and seizures.   Clinical Impression  Pt admitted secondary to problem above with deficits below. Pt requiring mod A to perform bed mobility. Attempted to stand with max A, however, pt only able to achieve minimal clearance of hips. Feel pt would benefit from SNF level therapies, however, may decide to go home with [facility] services. If pt does go home, will need to ensure they have lift equipment, as pt is very weak. Will continue to follow acutely to maximize functional mobility independence and safety.     Follow Up Recommendations SNF;Supervision/Assistance - 24 hour(If pt and family refuse will require max HH services)    Equipment Recommendations  Other (comment)(hoyer lift and pad if they do not already have)    Recommendations for Other Services       Precautions / Restrictions Precautions Precautions: Fall Restrictions Weight Bearing Restrictions: No      Mobility  Bed Mobility Overal bed mobility: Needs Assistance Bed Mobility: Supine to Sit;Sit to Supine     Supine to sit: Mod assist Sit to supine: Mod assist   General bed mobility comments: Mod A for assist with trunk and LEs. Increased time required.   Transfers Overall transfer level: Needs assistance Equipment used: Rolling walker (2 wheeled) Transfers: Sit to/from Stand Sit to Stand: Max assist         General transfer comment: Attempted to stand with max A, however, only able to achieve minimal clearance of hips.   Ambulation/Gait                Stairs            Wheelchair Mobility    Modified Rankin (Stroke Patients Only)       Balance Overall balance assessment: Needs assistance Sitting-balance  support: No upper extremity supported;Feet supported Sitting balance-Leahy Scale: Fair         Standing balance comment: Unable to stand                             Pertinent Vitals/Pain Pain Assessment: No/denies pain    Home Living Family/patient expects to be discharged to:: Private residence Living Arrangements: Spouse/significant other Available Help at Discharge: Family;Available 24 hours/day Type of Home: Mobile home Home Access: Ramped entrance     Home Layout: One level Home Equipment: [place] - 2 wheels;Wheelchair - manual;Grab bars - toilet;Grab bars - tub/shower;Bedside commode      Prior Function Level of Independence: Needs assistance   Gait / Transfers Assistance Needed: Limited ambulation at home with RW. Was able to transer to [facility]  ADL's / Homemaking Assistance Needed: Husband assists with bathing and dressing.         Hand Dominance        Extremity/Trunk Assessment   Upper Extremity Assessment Upper Extremity Assessment: Generalized weakness    Lower Extremity Assessment Lower Extremity Assessment: Generalized weakness    Cervical / Trunk Assessment Cervical / Trunk Assessment: Kyphotic;Other exceptions Cervical / Trunk Exceptions: sacral ulcer  Communication   Communication: Expressive difficulties  Cognition Arousal/Alertness: Awake/alert Behavior During Therapy: WFL for tasks assessed/performed Overall Cognitive Status: No family/caregiver present to determine baseline cognitive functioning  General Comments: Pt with some memory deficits noted. Unsure of baseline.       General Comments      Exercises     Assessment/Plan    PT Assessment Patient needs continued PT services  PT Problem List Decreased strength;Decreased balance;Decreased mobility;Decreased activity tolerance;Decreased knowledge of use of DME;Decreased cognition;Decreased knowledge of precautions       PT  Treatment Interventions DME instruction;Gait training;Functional mobility training;Therapeutic activities;Therapeutic exercise;Balance training;Patient/family education;Wheelchair mobility training    PT Goals (Current goals can be found in the Care Plan section)  Acute Rehab PT Goals Patient Stated Goal: to get stronger PT Goal Formulation: With patient Time For Goal Achievement: 04/21/19 Potential to Achieve Goals: Fair    Frequency Min 3X/week   Barriers to discharge        Co-evaluation               AM-PAC PT "6 Clicks" Mobility  Outcome Measure Help needed turning from your back to your side while in a flat bed without using bedrails?: A Lot Help needed moving from lying on your back to sitting on the side of a flat bed without using bedrails?: A Lot Help needed moving to and from a bed to a chair (including a wheelchair)?: Total Help needed standing up from a chair using your arms (e.g., wheelchair or bedside chair)?: Total Help needed to walk in hospital room?: Total Help needed climbing 3-5 steps with a railing? : Total 6 Click Score: 8    End of Session Equipment Utilized During Treatment: Gait belt Activity Tolerance: Patient tolerated treatment well Patient left: in bed;with call bell/phone within reach;with bed alarm set Nurse Communication: Mobility status PT Visit Diagnosis: Difficulty in walking, not elsewhere classified (R26.2);Muscle weakness (generalized) (M62.81);Unsteadiness on feet (R26.81)    Time: [id number] PT Time Calculation (min) (ACUTE ONLY): 20 min   Charges:   PT Evaluation $PT Eval Moderate Complexity: 1 Mod          Reuel Derby, PT, DPT  Acute Rehabilitation Services  Pager: [phone] Office: [phone]   Rudean Hitt 04/07/2019, 2:56 PM

## 2019-04-08 ENCOUNTER — Other Ambulatory Visit: Payer: Self-pay

## 2019-04-08 ENCOUNTER — Inpatient Hospital Stay (HOSPITAL_COMMUNITY)
Admission: EM | Admit: 2019-04-08 | Discharge: 2019-04-13 | DRG: 606 | Disposition: A | Discharge status: Home Health | Payer: Medicare Other | Attending: Family Medicine | Admitting: Family Medicine

## 2019-04-08 ENCOUNTER — Encounter (HOSPITAL_COMMUNITY): Payer: Self-pay | Admitting: *Deleted

## 2019-04-08 DIAGNOSIS — L89154 Pressure ulcer of sacral region, stage 4: Secondary | ICD-10-CM | POA: Diagnosis present

## 2019-04-08 DIAGNOSIS — Z7901 Long term (current) use of anticoagulants: Secondary | ICD-10-CM

## 2019-04-08 DIAGNOSIS — Z20822 Contact with and (suspected) exposure to covid-19: Secondary | ICD-10-CM | POA: Diagnosis not present

## 2019-04-08 DIAGNOSIS — E1165 Type 2 diabetes mellitus with hyperglycemia: Secondary | ICD-10-CM | POA: Diagnosis not present

## 2019-04-08 DIAGNOSIS — Z8619 Personal history of other infectious and parasitic diseases: Secondary | ICD-10-CM

## 2019-04-08 DIAGNOSIS — Z79899 Other long term (current) drug therapy: Secondary | ICD-10-CM

## 2019-04-08 DIAGNOSIS — I5032 Chronic diastolic (congestive) heart failure: Secondary | ICD-10-CM | POA: Diagnosis not present

## 2019-04-08 DIAGNOSIS — R0902 Hypoxemia: Secondary | ICD-10-CM | POA: Diagnosis present

## 2019-04-08 DIAGNOSIS — L500 Allergic urticaria: Principal | ICD-10-CM | POA: Diagnosis present

## 2019-04-08 DIAGNOSIS — T360X5A Adverse effect of penicillins, initial encounter: Secondary | ICD-10-CM | POA: Diagnosis present

## 2019-04-08 DIAGNOSIS — Z806 Family history of leukemia: Secondary | ICD-10-CM

## 2019-04-08 DIAGNOSIS — M797 Fibromyalgia: Secondary | ICD-10-CM | POA: Diagnosis present

## 2019-04-08 DIAGNOSIS — F418 Other specified anxiety disorders: Secondary | ICD-10-CM | POA: Diagnosis present

## 2019-04-08 DIAGNOSIS — Z885 Allergy status to narcotic agent status: Secondary | ICD-10-CM

## 2019-04-08 DIAGNOSIS — Z91018 Allergy to other foods: Secondary | ICD-10-CM

## 2019-04-08 DIAGNOSIS — M310 Hypersensitivity angiitis: Secondary | ICD-10-CM | POA: Diagnosis not present

## 2019-04-08 DIAGNOSIS — Z96651 Presence of right artificial knee joint: Secondary | ICD-10-CM | POA: Diagnosis present

## 2019-04-08 DIAGNOSIS — Z6834 Body mass index (BMI) 34.0-34.9, adult: Secondary | ICD-10-CM

## 2019-04-08 DIAGNOSIS — R21 Rash and other nonspecific skin eruption: Secondary | ICD-10-CM

## 2019-04-08 DIAGNOSIS — M869 Osteomyelitis, unspecified: Secondary | ICD-10-CM | POA: Diagnosis not present

## 2019-04-08 DIAGNOSIS — Z96641 Presence of right artificial hip joint: Secondary | ICD-10-CM | POA: Diagnosis present

## 2019-04-08 DIAGNOSIS — Z9109 Other allergy status, other than to drugs and biological substances: Secondary | ICD-10-CM

## 2019-04-08 DIAGNOSIS — F329 Major depressive disorder, single episode, unspecified: Secondary | ICD-10-CM | POA: Diagnosis present

## 2019-04-08 DIAGNOSIS — E1169 Type 2 diabetes mellitus with other specified complication: Secondary | ICD-10-CM | POA: Diagnosis present

## 2019-04-08 DIAGNOSIS — R52 Pain, unspecified: Secondary | ICD-10-CM | POA: Diagnosis not present

## 2019-04-08 DIAGNOSIS — K5900 Constipation, unspecified: Secondary | ICD-10-CM | POA: Diagnosis present

## 2019-04-08 DIAGNOSIS — F419 Anxiety disorder, unspecified: Secondary | ICD-10-CM | POA: Diagnosis present

## 2019-04-08 DIAGNOSIS — Z8249 Family history of ischemic heart disease and other diseases of the circulatory system: Secondary | ICD-10-CM

## 2019-04-08 DIAGNOSIS — M4628 Osteomyelitis of vertebra, sacral and sacrococcygeal region: Secondary | ICD-10-CM | POA: Diagnosis not present

## 2019-04-08 DIAGNOSIS — L89324 Pressure ulcer of left buttock, stage 4: Secondary | ICD-10-CM | POA: Diagnosis present

## 2019-04-08 DIAGNOSIS — T7840XA Allergy, unspecified, initial encounter: Secondary | ICD-10-CM | POA: Diagnosis not present

## 2019-04-08 DIAGNOSIS — E11622 Type 2 diabetes mellitus with other skin ulcer: Secondary | ICD-10-CM | POA: Diagnosis present

## 2019-04-08 DIAGNOSIS — Z794 Long term (current) use of insulin: Secondary | ICD-10-CM

## 2019-04-08 DIAGNOSIS — G4733 Obstructive sleep apnea (adult) (pediatric): Secondary | ICD-10-CM | POA: Diagnosis present

## 2019-04-08 DIAGNOSIS — I10 Essential (primary) hypertension: Secondary | ICD-10-CM | POA: Diagnosis present

## 2019-04-08 DIAGNOSIS — I110 Hypertensive heart disease with heart failure: Secondary | ICD-10-CM | POA: Diagnosis present

## 2019-04-08 DIAGNOSIS — Z888 Allergy status to other drugs, medicaments and biological substances status: Secondary | ICD-10-CM

## 2019-04-08 DIAGNOSIS — N390 Urinary tract infection, site not specified: Secondary | ICD-10-CM | POA: Diagnosis present

## 2019-04-08 DIAGNOSIS — E119 Type 2 diabetes mellitus without complications: Secondary | ICD-10-CM

## 2019-04-08 DIAGNOSIS — I4821 Permanent atrial fibrillation: Secondary | ICD-10-CM | POA: Diagnosis not present

## 2019-04-08 DIAGNOSIS — B965 Pseudomonas (aeruginosa) (mallei) (pseudomallei) as the cause of diseases classified elsewhere: Secondary | ICD-10-CM | POA: Diagnosis present

## 2019-04-08 LAB — COMPREHENSIVE METABOLIC PANEL
ALT: 14 U/L (ref 0–44)
AST: 23 U/L (ref 15–41)
Albumin: 3.2 g/dL — ABNORMAL LOW (ref 3.5–5.0)
Alkaline Phosphatase: 66 U/L (ref 38–126)
Anion gap: 11 (ref 5–15)
BUN: 8 mg/dL (ref 8–23)
CO2: 23 mmol/L (ref 22–32)
Calcium: 9.3 mg/dL (ref 8.9–10.3)
Chloride: 106 mmol/L (ref 98–111)
Creatinine, Ser: 0.85 mg/dL (ref 0.44–1.00)
GFR calc Af Amer: >60 mL/min (ref >60)
GFR calc non Af Amer: >60 mL/min (ref >60)
Glucose, Bld: 142 mg/dL — ABNORMAL HIGH (ref 70–99)
Potassium: 3.5 mmol/L (ref 3.5–5.1)
Sodium: 140 mmol/L (ref 135–145)
Total Bilirubin: 0.6 mg/dL (ref 0.3–1.2)
Total Protein: 7.1 g/dL (ref 6.5–8.1)

## 2019-04-08 LAB — CBC WITH DIFFERENTIAL/PLATELET
Abs Immature Granulocytes: 0 10*3/uL (ref 0.00–0.07)
Basophils Absolute: 0 10*3/uL (ref 0.0–0.1)
Basophils Relative: 0 %
Eosinophils Absolute: 0.2 10*3/uL (ref 0.0–0.5)
Eosinophils Relative: 4 %
HCT: 37.2 % (ref 36.0–46.0)
Hemoglobin: 10.6 g/dL — ABNORMAL LOW (ref 12.0–15.0)
Lymphocytes Relative: 25 %
Lymphs Abs: 1.5 10*3/uL (ref 0.7–4.0)
MCH: 23 pg — ABNORMAL LOW (ref 26.0–34.0)
MCHC: 28.5 g/dL — ABNORMAL LOW (ref 30.0–36.0)
MCV: 80.7 fL (ref 80.0–100.0)
Monocytes Absolute: 0.2 10*3/uL (ref 0.1–1.0)
Monocytes Relative: 4 %
Neutro Abs: 4 10*3/uL (ref 1.7–7.7)
Neutrophils Relative %: 67 %
Platelets: 227 10*3/uL (ref 150–400)
RBC: 4.61 MIL/uL (ref 3.87–5.11)
RDW: 18.6 % — ABNORMAL HIGH (ref 11.5–15.5)
WBC: 5.9 10*3/uL (ref 4.0–10.5)
nRBC: 0 % (ref 0.0–0.2)
nRBC: 1 /100 WBC — ABNORMAL HIGH

## 2019-04-08 LAB — APTT: aPTT: 32 seconds (ref 24–36)

## 2019-04-08 LAB — PROTIME-INR
INR: 1.8 — ABNORMAL HIGH (ref 0.8–1.2)
Prothrombin Time: 20.6 seconds — ABNORMAL HIGH (ref 11.4–15.2)

## 2019-04-08 MED ORDER — DIPHENHYDRAMINE HCL 12.5 MG/5ML PO ELIX
25.0000 mg | ORAL_SOLUTION | Freq: Once | ORAL | Status: AC
  Administered 2019-04-08: 21:00:00 25 mg via ORAL
  Filled 2019-04-08: qty 10

## 2019-04-08 MED ORDER — SODIUM CHLORIDE 0.9 % IV SOLN
1.0000 g | Freq: Three times a day (TID) | INTRAVENOUS | Status: DC
  Administered 2019-04-09 – 2019-04-10 (×5): 1 g via INTRAVENOUS
  Filled 2019-04-08 (×7): qty 1

## 2019-04-08 MED ORDER — PIPERACILLIN-TAZOBACTAM 3.375 G IVPB 30 MIN
3.3750 g | Freq: Once | INTRAVENOUS | Status: DC
  Filled 2019-04-08: qty 50

## 2019-04-08 MED ORDER — WARFARIN SODIUM 10 MG PO TABS
10.0000 mg | ORAL_TABLET | ORAL | Status: AC
  Administered 2019-04-09: 01:00:00 10 mg via ORAL
  Filled 2019-04-08: qty 1

## 2019-04-08 MED ORDER — WARFARIN - PHARMACIST DOSING INPATIENT: Freq: Every day | Status: DC

## 2019-04-08 MED ORDER — OXYCODONE-ACETAMINOPHEN 5-325 MG PO TABS
2.0000 | ORAL_TABLET | Freq: Once | ORAL | Status: AC
  Administered 2019-04-08: 23:00:00 2 via ORAL
  Filled 2019-04-08: qty 2

## 2019-04-08 MED ORDER — WARFARIN SODIUM 7.5 MG PO TABS
7.5000 mg | ORAL_TABLET | ORAL | Status: DC
  Filled 2019-04-08: qty 1

## 2019-04-08 NOTE — Progress Notes (Addendum)
ANTICOAGULATION/ANTIBIOTIC CONSULT NOTE  Pharmacy Consult for Coumadin and Meropenem Indication: atrial fibrillation and sacral osteomyelitis  Allergies  Allergen Reactions  . Bee Venom Hives    Takes benadryl  . Other Other (See Comments)    Raw foods with seeds give her "boils".  Avoids raw strawberries, blueberries. Tolerates cooked fruits, tomato sauce, bread/grains with seeds. [facility] 10/17/13 Berries with seeds Allergy to glutamine-C-quercet-selen-brom per [facility] 09/25/17  . Gluten Meal Other (See Comments)    Per MAR  . Alteplase Rash  . Cefepime Rash  . Fentanyl Other (See Comments)    Hallucinations, syncope    . Metformin And Related Diarrhea  . Zosyn [Piperacillin Sod-Tazobactam So] Rash    Itchy total body rash, petechial though instead of hives.    Patient Measurements:    Vital Signs: Temp: 97.9 F (36.6 C) (02/05 1801) Temp Source: Oral (02/05 1341) BP: 127/64 (02/05 2300) Pulse Rate: 99 (02/05 2300)  Labs: Recent Labs    04/06/19 0053 04/07/19 0434 04/08/19 1909  HGB  --   --  10.6*  HCT  --   --  37.2  PLT  --   --  227  APTT  --   --  32  LABPROT 21.5* 19.6* 20.6*  INR 1.9* 1.7* 1.8*  CREATININE 0.80  --  0.85    Estimated Creatinine Clearance: 87.6 mL/min (by C-G formula based on SCr of 0.85 mg/dL).   Medical History: Past Medical History:  Diagnosis Date  . CHF (congestive heart failure) ([place])   . Chronic /permanent atrial fibrillation ([place]) 2005   On Warfarin (follwed @ [facility]); Rate controlled - On BB  . Chronic diastolic heart failure, NYHA class 2 ([place])    Updated by A. fib, hypertensive heart disease and obesity; EDP was only 7 by cardiac catheterization in September 2017  . Diabetic lumbosacral plexopathy ([place])   . Fibromyalgia   . Generalized anxiety disorder   . Hypokalemia 09/03/2017  . Incontinence   . Major depressive disorder   . OSA on CPAP   . Restless leg syndrome   . Syncope 09/03/2017  . Type  II diabetes mellitus (H[facility])     Assessment: 69 yo female admitted s/p total body rash (with Zosyn - started after d/c home)  AC: Coumadin PTA for afib. Had been taking about 7.5mg  daily. Admission INR 1.8 (slightly subtherapeutic). CBC ok on admission  ID: Changing abx to meropenem for sacral osteo and Kleb pneumo/pseudomonas in urine (may be colonizers). Noted plan was for abx until 05/13/19 for osteo.  1/31 Zosyn>>2/5 2/5 Meropenem>>  Goal of Therapy:  INR 2-3 Monitor platelets by anticoagulation protocol: Yes   Plan:  Coumadin 10 mg po x 1 tonight  Daily PT/INR Meropenem 1gm IV q8h F/u renal function  Sherlon Handing, PharmD, BCPS Please see amion for complete clinical pharmacist phone list 04/08/2019 11:49 PM

## 2019-04-08 NOTE — ED Triage Notes (Addendum)
Patient presents to ed via [facility]  States she was just discharged from hospital yest  After being treated for UTI was sent home with PICC and was to receive Zosyn at home , states she received her first dose of zosyn yest at 3am. States she woke up this am with rash over her entire body. Denies resp. Problems.

## 2019-04-09 DIAGNOSIS — B961 Klebsiella pneumoniae [K. pneumoniae] as the cause of diseases classified elsewhere: Secondary | ICD-10-CM

## 2019-04-09 DIAGNOSIS — Z806 Family history of leukemia: Secondary | ICD-10-CM | POA: Diagnosis not present

## 2019-04-09 DIAGNOSIS — I5032 Chronic diastolic (congestive) heart failure: Secondary | ICD-10-CM | POA: Diagnosis present

## 2019-04-09 DIAGNOSIS — Z794 Long term (current) use of insulin: Secondary | ICD-10-CM

## 2019-04-09 DIAGNOSIS — B965 Pseudomonas (aeruginosa) (mallei) (pseudomallei) as the cause of diseases classified elsewhere: Secondary | ICD-10-CM | POA: Diagnosis not present

## 2019-04-09 DIAGNOSIS — M4628 Osteomyelitis of vertebra, sacral and sacrococcygeal region: Secondary | ICD-10-CM

## 2019-04-09 DIAGNOSIS — I4821 Permanent atrial fibrillation: Secondary | ICD-10-CM | POA: Diagnosis present

## 2019-04-09 DIAGNOSIS — Z91030 Bee allergy status: Secondary | ICD-10-CM | POA: Diagnosis not present

## 2019-04-09 DIAGNOSIS — E1169 Type 2 diabetes mellitus with other specified complication: Secondary | ICD-10-CM | POA: Diagnosis not present

## 2019-04-09 DIAGNOSIS — R21 Rash and other nonspecific skin eruption: Secondary | ICD-10-CM | POA: Diagnosis not present

## 2019-04-09 DIAGNOSIS — I482 Chronic atrial fibrillation, unspecified: Secondary | ICD-10-CM

## 2019-04-09 DIAGNOSIS — Z1612 Extended spectrum beta lactamase (ESBL) resistance: Secondary | ICD-10-CM

## 2019-04-09 DIAGNOSIS — M2550 Pain in unspecified joint: Secondary | ICD-10-CM | POA: Diagnosis not present

## 2019-04-09 DIAGNOSIS — Z20822 Contact with and (suspected) exposure to covid-19: Secondary | ICD-10-CM | POA: Diagnosis present

## 2019-04-09 DIAGNOSIS — G4733 Obstructive sleep apnea (adult) (pediatric): Secondary | ICD-10-CM

## 2019-04-09 DIAGNOSIS — Z96641 Presence of right artificial hip joint: Secondary | ICD-10-CM | POA: Diagnosis present

## 2019-04-09 DIAGNOSIS — M797 Fibromyalgia: Secondary | ICD-10-CM | POA: Diagnosis present

## 2019-04-09 DIAGNOSIS — Z7401 Bed confinement status: Secondary | ICD-10-CM | POA: Diagnosis not present

## 2019-04-09 DIAGNOSIS — Z95828 Presence of other vascular implants and grafts: Secondary | ICD-10-CM

## 2019-04-09 DIAGNOSIS — I4891 Unspecified atrial fibrillation: Secondary | ICD-10-CM | POA: Diagnosis not present

## 2019-04-09 DIAGNOSIS — L89324 Pressure ulcer of left buttock, stage 4: Secondary | ICD-10-CM

## 2019-04-09 DIAGNOSIS — Z881 Allergy status to other antibiotic agents status: Secondary | ICD-10-CM | POA: Diagnosis not present

## 2019-04-09 DIAGNOSIS — Z8249 Family history of ischemic heart disease and other diseases of the circulatory system: Secondary | ICD-10-CM | POA: Diagnosis not present

## 2019-04-09 DIAGNOSIS — Z885 Allergy status to narcotic agent status: Secondary | ICD-10-CM | POA: Diagnosis not present

## 2019-04-09 DIAGNOSIS — L89159 Pressure ulcer of sacral region, unspecified stage: Secondary | ICD-10-CM | POA: Diagnosis not present

## 2019-04-09 DIAGNOSIS — Z888 Allergy status to other drugs, medicaments and biological substances status: Secondary | ICD-10-CM | POA: Diagnosis not present

## 2019-04-09 DIAGNOSIS — L89154 Pressure ulcer of sacral region, stage 4: Secondary | ICD-10-CM | POA: Diagnosis present

## 2019-04-09 DIAGNOSIS — Z9109 Other allergy status, other than to drugs and biological substances: Secondary | ICD-10-CM | POA: Diagnosis not present

## 2019-04-09 DIAGNOSIS — Z91018 Allergy to other foods: Secondary | ICD-10-CM | POA: Diagnosis not present

## 2019-04-09 DIAGNOSIS — E1149 Type 2 diabetes mellitus with other diabetic neurological complication: Secondary | ICD-10-CM

## 2019-04-09 DIAGNOSIS — N390 Urinary tract infection, site not specified: Secondary | ICD-10-CM

## 2019-04-09 DIAGNOSIS — F329 Major depressive disorder, single episode, unspecified: Secondary | ICD-10-CM | POA: Diagnosis present

## 2019-04-09 DIAGNOSIS — F419 Anxiety disorder, unspecified: Secondary | ICD-10-CM | POA: Diagnosis present

## 2019-04-09 DIAGNOSIS — Z96651 Presence of right artificial knee joint: Secondary | ICD-10-CM | POA: Diagnosis present

## 2019-04-09 DIAGNOSIS — T7840XA Allergy, unspecified, initial encounter: Secondary | ICD-10-CM

## 2019-04-09 DIAGNOSIS — T360X5A Adverse effect of penicillins, initial encounter: Secondary | ICD-10-CM | POA: Diagnosis present

## 2019-04-09 DIAGNOSIS — I110 Hypertensive heart disease with heart failure: Secondary | ICD-10-CM | POA: Diagnosis present

## 2019-04-09 DIAGNOSIS — L500 Allergic urticaria: Secondary | ICD-10-CM | POA: Diagnosis not present

## 2019-04-09 DIAGNOSIS — Z6834 Body mass index (BMI) 34.0-34.9, adult: Secondary | ICD-10-CM | POA: Diagnosis not present

## 2019-04-09 DIAGNOSIS — R0902 Hypoxemia: Secondary | ICD-10-CM | POA: Diagnosis not present

## 2019-04-09 LAB — COMPREHENSIVE METABOLIC PANEL
ALT: 11 U/L (ref 0–44)
AST: 19 U/L (ref 15–41)
Albumin: 2.9 g/dL — ABNORMAL LOW (ref 3.5–5.0)
Alkaline Phosphatase: 55 U/L (ref 38–126)
Anion gap: 7 (ref 5–15)
BUN: 6 mg/dL — ABNORMAL LOW (ref 8–23)
CO2: 26 mmol/L (ref 22–32)
Calcium: 8.9 mg/dL (ref 8.9–10.3)
Chloride: 108 mmol/L (ref 98–111)
Creatinine, Ser: 0.8 mg/dL (ref 0.44–1.00)
GFR calc Af Amer: >60 mL/min (ref >60)
GFR calc non Af Amer: >60 mL/min (ref >60)
Glucose, Bld: 120 mg/dL — ABNORMAL HIGH (ref 70–99)
Potassium: 3.1 mmol/L — ABNORMAL LOW (ref 3.5–5.1)
Sodium: 141 mmol/L (ref 135–145)
Total Bilirubin: 0.8 mg/dL (ref 0.3–1.2)
Total Protein: 6.2 g/dL — ABNORMAL LOW (ref 6.5–8.1)

## 2019-04-09 LAB — CBC
HCT: 33.2 % — ABNORMAL LOW (ref 36.0–46.0)
Hemoglobin: 9.7 g/dL — ABNORMAL LOW (ref 12.0–15.0)
MCH: 23 pg — ABNORMAL LOW (ref 26.0–34.0)
MCHC: 29.2 g/dL — ABNORMAL LOW (ref 30.0–36.0)
MCV: 78.7 fL — ABNORMAL LOW (ref 80.0–100.0)
Platelets: 192 10*3/uL (ref 150–400)
RBC: 4.22 MIL/uL (ref 3.87–5.11)
RDW: 18.7 % — ABNORMAL HIGH (ref 11.5–15.5)
WBC: 5.6 10*3/uL (ref 4.0–10.5)
nRBC: 0 % (ref 0.0–0.2)

## 2019-04-09 LAB — SARS CORONAVIRUS 2 (TAT 6-24 HRS): SARS Coronavirus 2: NEGATIVE

## 2019-04-09 LAB — GLUCOSE, CAPILLARY
Glucose-Capillary: 136 mg/dL — ABNORMAL HIGH (ref 70–99)
Glucose-Capillary: 152 mg/dL — ABNORMAL HIGH (ref 70–99)
Glucose-Capillary: 145 mg/dL — ABNORMAL HIGH (ref 70–99)
Glucose-Capillary: 132 mg/dL — ABNORMAL HIGH (ref 70–99)

## 2019-04-09 LAB — PROTIME-INR
INR: 2 — ABNORMAL HIGH (ref 0.8–1.2)
Prothrombin Time: 22.2 seconds — ABNORMAL HIGH (ref 11.4–15.2)

## 2019-04-09 MED ORDER — GABAPENTIN 300 MG PO CAPS
300.0000 mg | ORAL_CAPSULE | Freq: Three times a day (TID) | ORAL | Status: DC
  Administered 2019-04-09 – 2019-04-13 (×13): 300 mg via ORAL
  Filled 2019-04-09 (×14): qty 1

## 2019-04-09 MED ORDER — FERROUS SULFATE 325 (65 FE) MG PO TABS
325.0000 mg | ORAL_TABLET | Freq: Every day | ORAL | Status: DC
  Administered 2019-04-09 – 2019-04-13 (×5): 325 mg via ORAL
  Filled 2019-04-09 (×5): qty 1

## 2019-04-09 MED ORDER — CHLORHEXIDINE GLUCONATE CLOTH 2 % EX PADS
6.0000 | MEDICATED_PAD | Freq: Every day | CUTANEOUS | Status: DC
  Administered 2019-04-09 – 2019-04-13 (×4): 6 via TOPICAL

## 2019-04-09 MED ORDER — LIDOCAINE 5 % EX PTCH: 1.0000 | MEDICATED_PATCH | Freq: Every day | CUTANEOUS | Status: DC | PRN

## 2019-04-09 MED ORDER — ACETAMINOPHEN 325 MG PO TABS
650.0000 mg | ORAL_TABLET | Freq: Four times a day (QID) | ORAL | Status: DC | PRN
  Filled 2019-04-09: qty 2

## 2019-04-09 MED ORDER — SPIRONOLACTONE 12.5 MG HALF TABLET
12.5000 mg | ORAL_TABLET | Freq: Every day | ORAL | Status: DC
  Administered 2019-04-09 – 2019-04-13 (×5): 12.5 mg via ORAL
  Filled 2019-04-09 (×5): qty 1

## 2019-04-09 MED ORDER — DIPHENHYDRAMINE HCL 25 MG PO CAPS
25.0000 mg | ORAL_CAPSULE | Freq: Three times a day (TID) | ORAL | Status: DC | PRN
  Administered 2019-04-09 – 2019-04-12 (×7): 25 mg via ORAL
  Filled 2019-04-09 (×7): qty 1

## 2019-04-09 MED ORDER — POTASSIUM CHLORIDE CRYS ER 20 MEQ PO TBCR
40.0000 meq | EXTENDED_RELEASE_TABLET | ORAL | Status: AC
  Administered 2019-04-09 (×2): 40 meq via ORAL
  Filled 2019-04-09 (×2): qty 2

## 2019-04-09 MED ORDER — DOCUSATE SODIUM 100 MG PO CAPS: 100.0000 mg | ORAL_CAPSULE | Freq: Every day | ORAL | Status: DC | PRN

## 2019-04-09 MED ORDER — EZETIMIBE 10 MG PO TABS
10.0000 mg | ORAL_TABLET | Freq: Every day | ORAL | Status: DC
  Administered 2019-04-09 – 2019-04-12 (×5): 10 mg via ORAL
  Filled 2019-04-09 (×5): qty 1

## 2019-04-09 MED ORDER — BUSPIRONE HCL 5 MG PO TABS
10.0000 mg | ORAL_TABLET | Freq: Two times a day (BID) | ORAL | Status: DC
  Administered 2019-04-09 – 2019-04-13 (×10): 10 mg via ORAL
  Filled 2019-04-09: qty 2
  Filled 2019-04-09: qty 1
  Filled 2019-04-09 (×8): qty 2

## 2019-04-09 MED ORDER — ACETAMINOPHEN 500 MG PO TABS
1000.0000 mg | ORAL_TABLET | Freq: Three times a day (TID) | ORAL | Status: DC
  Administered 2019-04-09 – 2019-04-13 (×13): 1000 mg via ORAL
  Filled 2019-04-09 (×14): qty 2

## 2019-04-09 MED ORDER — ALPRAZOLAM 0.25 MG PO TABS
0.2500 mg | ORAL_TABLET | Freq: Three times a day (TID) | ORAL | Status: DC
  Administered 2019-04-09 – 2019-04-13 (×14): 0.25 mg via ORAL
  Filled 2019-04-09 (×14): qty 1

## 2019-04-09 MED ORDER — METOPROLOL SUCCINATE ER 25 MG PO TB24
25.0000 mg | ORAL_TABLET | Freq: Every day | ORAL | Status: DC
  Administered 2019-04-09 – 2019-04-13 (×5): 25 mg via ORAL
  Filled 2019-04-09 (×5): qty 1

## 2019-04-09 MED ORDER — SERTRALINE HCL 100 MG PO TABS
100.0000 mg | ORAL_TABLET | Freq: Every day | ORAL | Status: DC
  Administered 2019-04-09 – 2019-04-12 (×5): 100 mg via ORAL
  Filled 2019-04-09 (×5): qty 1

## 2019-04-09 MED ORDER — LOPERAMIDE HCL 2 MG PO CAPS: 2.0000 mg | ORAL_CAPSULE | Freq: Four times a day (QID) | ORAL | Status: DC | PRN

## 2019-04-09 MED ORDER — WARFARIN SODIUM 7.5 MG PO TABS
7.5000 mg | ORAL_TABLET | Freq: Once | ORAL | Status: AC
  Administered 2019-04-09: 7.5 mg via ORAL
  Filled 2019-04-09: qty 1

## 2019-04-09 MED ORDER — ACETAMINOPHEN 650 MG RE SUPP: 650.0000 mg | Freq: Four times a day (QID) | RECTAL | Status: DC | PRN

## 2019-04-09 MED ORDER — PRAMIPEXOLE DIHYDROCHLORIDE 0.125 MG PO TABS
0.1250 mg | ORAL_TABLET | Freq: Every day | ORAL | Status: DC
  Administered 2019-04-09 – 2019-04-12 (×5): 0.125 mg via ORAL
  Filled 2019-04-09 (×5): qty 1

## 2019-04-09 MED ORDER — ARIPIPRAZOLE 10 MG PO TABS
10.0000 mg | ORAL_TABLET | Freq: Every day | ORAL | Status: DC
  Administered 2019-04-09 – 2019-04-13 (×5): 10 mg via ORAL
  Filled 2019-04-09 (×5): qty 1

## 2019-04-09 MED ORDER — INSULIN GLARGINE 100 UNIT/ML ~~LOC~~ SOLN
25.0000 [IU] | Freq: Every day | SUBCUTANEOUS | Status: DC
  Administered 2019-04-09 – 2019-04-12 (×5): 25 [IU] via SUBCUTANEOUS
  Filled 2019-04-09 (×6): qty 0.25

## 2019-04-09 MED ORDER — OXYCODONE HCL 5 MG PO TABS
5.0000 mg | ORAL_TABLET | Freq: Four times a day (QID) | ORAL | Status: DC | PRN
  Administered 2019-04-10 – 2019-04-12 (×2): 5 mg via ORAL
  Filled 2019-04-09 (×2): qty 1

## 2019-04-09 MED ORDER — ONDANSETRON HCL 4 MG/2ML IJ SOLN
4.0000 mg | Freq: Four times a day (QID) | INTRAMUSCULAR | Status: DC | PRN
  Administered 2019-04-09 – 2019-04-10 (×2): 4 mg via INTRAVENOUS
  Filled 2019-04-09 (×2): qty 2

## 2019-04-09 MED ORDER — DULAGLUTIDE 1.5 MG/0.5ML ~~LOC~~ SOAJ: 1.5000 mg | SUBCUTANEOUS | Status: DC

## 2019-04-09 MED ORDER — INSULIN ASPART 100 UNIT/ML ~~LOC~~ SOLN
0.0000 [IU] | Freq: Three times a day (TID) | SUBCUTANEOUS | Status: DC
  Administered 2019-04-09: 12:00:00 3 [IU] via SUBCUTANEOUS
  Administered 2019-04-09 – 2019-04-10 (×3): 2 [IU] via SUBCUTANEOUS
  Administered 2019-04-10: 5 [IU] via SUBCUTANEOUS
  Administered 2019-04-12 – 2019-04-13 (×2): 3 [IU] via SUBCUTANEOUS
  Administered 2019-04-13: 2 [IU] via SUBCUTANEOUS

## 2019-04-09 MED ORDER — ONDANSETRON HCL 4 MG PO TABS: 4.0000 mg | ORAL_TABLET | Freq: Four times a day (QID) | ORAL | Status: DC | PRN

## 2019-04-09 MED ORDER — MAGNESIUM OXIDE 400 (241.3 MG) MG PO TABS
400.0000 mg | ORAL_TABLET | Freq: Three times a day (TID) | ORAL | Status: DC
  Administered 2019-04-09 – 2019-04-13 (×13): 400 mg via ORAL
  Filled 2019-04-09 (×14): qty 1

## 2019-04-09 NOTE — ED Provider Notes (Signed)
Donna [name] [facility]  SURGICAL Provider Note   CSN: [id number] Arrival date & time: 04/08/19  1336     History Chief Complaint  Patient presents with  . Rash    Donna [name] is a 69 y.o. female.  HPI      69 y.o. female with medical history significant of DM2, OSA on CPAP, a.fib on coumadin, chronic sacral decubitus ulcer.  Patient was admitted on 1/30 to ICU in septic shock.  Source was either urine (which grew out 100k CFU of ESBL klebsellia and 10k CFU of pseudomonas), and/or sacral osteomyelitis.    Patient was discharged just yesterday with Zosyn for 6 weeks.  She woke up today and reports noticing a rash.  Over time her rash became diffuse.  There is some discomfort and itching.  She reports that she had allergic reaction with prior IV antibiotics through the PICC few years back.  Patient denies any fevers, chills, shortness of breath. She also reports worsening pain in her sacrum.  Past Medical History:  Diagnosis Date  . CHF (congestive heart failure) ([place])   . Chronic /permanent atrial fibrillation ([place]) 2005   On Warfarin (follwed @ [facility]); Rate controlled - On BB  . Chronic diastolic heart failure, NYHA class 2 ([place])    Updated by A. fib, hypertensive heart disease and obesity; EDP was only 7 by cardiac catheterization in September 2017  . Diabetic lumbosacral plexopathy ([place])   . Fibromyalgia   . Generalized anxiety disorder   . Hypokalemia 09/03/2017  . Incontinence   . Major depressive disorder   . OSA on CPAP   . Restless leg syndrome   . Syncope 09/03/2017  . Type II diabetes mellitus [facility])     Patient Active Problem List   Diagnosis Date Noted  . Allergic reaction 04/09/2019  . Sacral osteomyelitis ([place]) 04/08/2019  . Allergic urticaria 04/08/2019  . Abscess of back 04/04/2019  . Sepsis ([place]) 04/03/2019  . Pressure injury of skin 04/03/2019  . Orthostatic hypotension   . Leukocytosis 11/13/2018  .  Hypomagnesemia 02/02/2018  . Preop cardiovascular exam 02/02/2018  . Decubitus ulcer of ischium, left, stage IV ([place]) 09/28/2017  . Atrial fibrillation with RVR ([place]) 09/25/2017  . Cellulitis of sacral region 09/25/2017  . Normocytic anemia 09/25/2017  . Type 2 diabetes mellitus ([place]) 09/25/2017  . Chronic pain 09/25/2017  . Chronic atrial fibrillation with RVR   . History of seizures 09/03/2017  . Hypokalemia 09/03/2017  . Depression 02/09/2017  . Anxiety 02/09/2017  . Venous stasis of both lower extremities 07/25/2016  . Essential hypertension 07/25/2016  . Anticoagulant long-term use 07/15/2016  . Anatomical narrow angle borderline glaucoma of both eyes 04/02/2016  . Chronic diastolic CHF (congestive heart failure) ([place]) 02/08/2016  . Chronic atrial fibrillation ([place]): CHA2DS2-VASc Score 5; on Warfarin 02/08/2016  . Morbid obesity ([place]) 02/08/2016  . OSA (obstructive sleep apnea) 02/08/2016  . Pre-operative cardiovascular examination 02/08/2016  . Medication management 02/08/2016  . Fibromyalgia 08/07/2012  . Restless legs syndrome 08/07/2012    Past Surgical History:  Procedure Laterality Date  . CARDIAC CATHETERIZATION  11/09/2015   [facility]: Nonocclusive CAD. EF 55%. EDP 7 mmHg.  Marland Kitchen CHOLECYSTECTOMY OPEN    . JOINT REPLACEMENT    . KNEE ARTHROSCOPY Right   . MASS EXCISION Left    sarcoma - shoulder  . SHOULDER OPEN ROTATOR CUFF REPAIR Bilateral   . TONSILLECTOMY    . TOTAL HIP ARTHROPLASTY Right  2015  . TOTAL HIP REVISION Right 2015  . TOTAL KNEE ARTHROPLASTY Right   . TRANSTHORACIC ECHOCARDIOGRAM  11/2013; 02/2016   a) [facility]: with Definity:  EF 60-65%. Mod LA dilation. Mild AoV sclerosis. Normal RHP. Indeterminate LV filling pressures. b) [facility]: Normal cavity size. EF 55-60%.no RWMA, mild AoV sclerosis (R cusp), Mod MAC. Mild biAtrial dilation.  . TRANSTHORACIC ECHOCARDIOGRAM  08/2017    Mild LVH with normal EF 55-60%.  Unable to fully assess wall  motion, but appear to be normal.  Unable to determine diastolic function because of A. fib.  Mild to moderate MAC with mild mitral stenosis (mean gradient 9 mmHg).  Moderate LA dilation.  CVP estimated 3 mmHg.  No obvious evidence of pulmonary hypertension.  Marland Kitchen VAGINAL HYSTERECTOMY       OB History   No obstetric history on file.     Family History  Problem Relation Age of Onset  . Hypertension Mother   . Heart disease Mother   . Leukemia Son     Social History   Tobacco Use  . Smoking status: Never Smoker  . Smokeless tobacco: Never Used  Substance Use Topics  . Alcohol use: No  . Drug use: No    Home Medications Prior to Admission medications   Medication Sig Start Date End Date Taking? Authorizing Provider  acetaminophen (TYLENOL) 500 MG tablet Take 1,000 mg by mouth 3 (three) times daily.    Yes [provider]  ALPRAZolam (XANAX) 0.5 MG tablet Take 0.25 mg by mouth 3 (three) times daily.   Yes [provider]  ARIPiprazole (ABILIFY) 10 MG tablet Take 10 mg by mouth daily.   Yes [provider]  busPIRone (BUSPAR) 10 MG tablet Take 10 mg by mouth 2 (two) times daily.    Yes [provider]  docusate sodium (COLACE) 100 MG capsule Take 100 mg by mouth daily as needed for mild constipation.   Yes [provider]  Dulaglutide (TRULICITY) 1.5 UL/8.4TX SOPN Inject 1.5 mg into the skin every Saturday.    Yes [provider]  ezetimibe (ZETIA) 10 MG tablet Take 10 mg by mouth at bedtime.    Yes [provider]  ferrous sulfate 325 (65 FE) MG tablet Take 325 mg by mouth daily with breakfast.    Yes [provider]  furosemide (LASIX) 20 MG tablet Take 1 tablet (20 mg total) by mouth daily. 04/13/19  Yes Johnson, Clanford L, MD  gabapentin (NEURONTIN) 300 MG capsule Take 1 capsule (300 mg total) by mouth 3 (three) times daily. 11/19/18  Yes Domenic Polite, MD  Insulin Glargine (BASAGLAR KWIKPEN) 100 UNIT/ML SOPN  Inject 0.25 mLs (25 Units total) into the skin at bedtime. 04/07/19  Yes Johnson, Clanford L, MD  insulin regular (NOVOLIN R) 100 units/mL injection Inject 0-12 Units into the skin 3 (three) times daily before meals. 0-200 0 units 201-250 6 units 251-300 8 units 301-350 10 units 351-400 12 units Greater than 400 call MD   Yes [provider]  lidocaine (LIDODERM) 5 % Place 1 patch onto the skin daily as needed (right hip pain). Remove & Discard patch within 12 hours or as directed by MD    Yes [provider]  loperamide (IMODIUM A-D) 2 MG tablet Take 2 mg by mouth 4 (four) times daily as needed for diarrhea or loose stools.   Yes [provider]  magnesium oxide (MAG-OX) 400 MG tablet Take 1 tablet (400 mg total)  by mouth 3 (three) times daily. ALSO TAKE A EXTRA 400 MG TABLET ON THE DAYS YOU TAKE ZAROXOLYN TABLET Patient taking differently: Take 400 mg by mouth 3 (three) times daily.  02/02/18  Yes Leonie Man, MD  metoprolol succinate (TOPROL-XL) 25 MG 24 hr tablet Take 1 tablet (25 mg total) by mouth daily. 04/07/19  Yes Johnson, Clanford L, MD  omeprazole (PRILOSEC) 20 MG capsule Take 20 mg by mouth daily as needed (acid reflux/indigestion).    Yes [provider]  oxyCODONE (OXY IR/ROXICODONE) 5 MG immediate release tablet Take 1 tablet (5 mg total) by mouth every 6 (six) hours as needed for severe pain. 04/07/19  Yes Johnson, Clanford L, MD  potassium chloride SA (KLOR-CON) 20 MEQ tablet Take 1 tablet (20 mEq total) by mouth daily. TAKE AN EXTRA 20 MEQ ON THE DAYS YOU TAKE ZAROXLYN 04/13/19  Yes Johnson, Clanford L, MD  pramipexole (MIRAPEX) 0.125 MG tablet Take 0.125 mg by mouth at bedtime.    Yes [provider]  sertraline (ZOLOFT) 100 MG tablet Take 100 mg by mouth at bedtime.    Yes [provider]  spironolactone (ALDACTONE) 25 MG tablet Take 0.5 tablets (12.5 mg total) by mouth daily. 04/08/19  Yes Johnson, Clanford L, MD  warfarin  (COUMADIN) 1 MG tablet Take 1 mg by mouth See admin instructions. Take as directed by MD per weekly levels   Yes [provider]  warfarin (JANTOVEN) 2 MG tablet Take 2 mg by mouth See admin instructions. Take as directed by MD per weekly levels   Yes [provider]  warfarin (JANTOVEN) 3 MG tablet Take 3 mg by mouth See admin instructions. Take as directed by MD per weekly levels   Yes [provider]  warfarin (JANTOVEN) 5 MG tablet Take 5 mg by mouth See admin instructions. Take as directed by MD per weekly levels   Yes [provider]    Allergies    Bee venom, Other, Zosyn [piperacillin sod-tazobactam so], Gluten meal, Alteplase, Cefepime, Fentanyl, Metformin and related, and Vancomycin  Review of Systems   Review of Systems  Constitutional: Positive for activity change.  Respiratory: Negative for shortness of breath.   Cardiovascular: Negative for chest pain.  Skin: Positive for rash.  Allergic/Immunologic: Negative for immunocompromised state.  Hematological: Bruises/bleeds easily.  All other systems reviewed and are negative.   Physical Exam Updated Vital Signs BP 112/82 (BP Location: Left Arm)   Pulse 92   Temp 98.1 F (36.7 C) (Oral)   Resp 20   SpO2 94%   Physical Exam Constitutional:      Appearance: She is well-developed.  HENT:     Head: Atraumatic.  Eyes:     Pupils: Pupils are equal, round, and reactive to light.  Cardiovascular:     Rate and Rhythm: Normal rate and regular rhythm.     Heart sounds: Normal heart sounds.  Pulmonary:     Effort: Pulmonary effort is normal. No respiratory distress.  Abdominal:     General: There is no distension.     Palpations: Abdomen is soft.     Tenderness: There is no abdominal tenderness. There is no guarding or rebound.  Musculoskeletal:     Cervical back: Neck supple.     Comments: Positive stage IV sacral pressure ulcer with minimal drainage  Skin:    General: Skin is warm and  dry.     Findings: Rash present.     Comments: Diffuse erythematous, flat  macular rash.  No clear blanching appreciated.  There is significant confluence of macular rash over her torso.  Additionally there is patch appreciated in the back towards the sacrum.  Neurological:     Mental Status: She is alert and oriented to person, place, and time.     ED Results / Procedures / Treatments   Labs (all labs ordered are listed, but only abnormal results are displayed) Labs Reviewed  CBC WITH DIFFERENTIAL/PLATELET - Abnormal; Notable for the following components:      Result Value   Hemoglobin 10.6 (*)    MCH 23.0 (*)    MCHC 28.5 (*)    RDW 18.6 (*)    nRBC 1 (*)    All other components within normal limits  COMPREHENSIVE METABOLIC PANEL - Abnormal; Notable for the following components:   Glucose, Bld 142 (*)    Albumin 3.2 (*)    All other components within normal limits  PROTIME-INR - Abnormal; Notable for the following components:   Prothrombin Time 20.6 (*)    INR 1.8 (*)    All other components within normal limits  PROTIME-INR - Abnormal; Notable for the following components:   Prothrombin Time 22.2 (*)    INR 2.0 (*)    All other components within normal limits  CBC - Abnormal; Notable for the following components:   Hemoglobin 9.7 (*)    HCT 33.2 (*)    MCV 78.7 (*)    MCH 23.0 (*)    MCHC 29.2 (*)    RDW 18.7 (*)    All other components within normal limits  COMPREHENSIVE METABOLIC PANEL - Abnormal; Notable for the following components:   Potassium 3.1 (*)    Glucose, Bld 120 (*)    BUN 6 (*)    Total Protein 6.2 (*)    Albumin 2.9 (*)    All other components within normal limits  GLUCOSE, CAPILLARY - Abnormal; Notable for the following components:   Glucose-Capillary 136 (*)    All other components within normal limits  GLUCOSE, CAPILLARY - Abnormal; Notable for the following components:   Glucose-Capillary 152 (*)    All other components within normal limits    SARS CORONAVIRUS 2 (TAT 6-24 HRS)  CULTURE, BLOOD (ROUTINE X 2)  CULTURE, BLOOD (ROUTINE X 2)  APTT    EKG None  Radiology No results found.  Procedures Procedures (including critical care time)  Medications Ordered in ED Medications  Warfarin - Pharmacist Dosing Inpatient (has no administration in time range)  meropenem (MERREM) 1 g in sodium chloride 0.9 % 100 mL IVPB (1 g Intravenous New Bag/Given 04/09/19 0840)  ALPRAZolam (XANAX) tablet 0.25 mg (0.25 mg Oral Given 04/09/19 0905)  spironolactone (ALDACTONE) tablet 12.5 mg (12.5 mg Oral Given 04/09/19 0905)  sertraline (ZOLOFT) tablet 100 mg (100 mg Oral Given 04/09/19 0059)  pramipexole (MIRAPEX) tablet 0.125 mg (0.125 mg Oral Given 04/09/19 0059)  metoprolol succinate (TOPROL-XL) 24 hr tablet 25 mg (25 mg Oral Given 04/09/19 0905)  busPIRone (BUSPAR) tablet 10 mg (10 mg Oral Given 04/09/19 0905)  ARIPiprazole (ABILIFY) tablet 10 mg (10 mg Oral Given 04/09/19 0905)  docusate sodium (COLACE) capsule 100 mg (has no administration in time range)  Dulaglutide SOPN 1.5 mg (1.5 mg Subcutaneous Not Given 04/09/19 0906)  ezetimibe (ZETIA) tablet 10 mg (10 mg Oral Given 04/09/19 0044)  ferrous sulfate tablet 325 mg (325 mg Oral Given 04/09/19 0837)  gabapentin (NEURONTIN) capsule 300 mg (300 mg Oral Given 04/09/19 0905)  insulin glargine (LANTUS) injection 25 Units (25 Units Subcutaneous Given 04/09/19 0044)  lidocaine (LIDODERM) 5 % 1 patch (has no administration in time range)  oxyCODONE (Oxy IR/ROXICODONE) immediate release tablet 5 mg (has no administration in time range)  loperamide (IMODIUM) capsule 2 mg (has no administration in time range)  magnesium oxide (MAG-OX) tablet 400 mg (400 mg Oral Given 04/09/19 0905)  acetaminophen (TYLENOL) tablet 1,000 mg (1,000 mg Oral Given 04/09/19 0905)  acetaminophen (TYLENOL) tablet 650 mg (has no administration in time range)    Or  acetaminophen (TYLENOL) suppository 650 mg (has no administration in time range)   ondansetron (ZOFRAN) tablet 4 mg ( Oral See Alternative 04/09/19 0905)    Or  ondansetron (ZOFRAN) injection 4 mg (4 mg Intravenous Given 04/09/19 0905)  insulin aspart (novoLOG) injection 0-15 Units (3 Units Subcutaneous Given 04/09/19 1211)  Chlorhexidine Gluconate Cloth 2 % PADS 6 each (6 each Topical Given 04/09/19 0906)  diphenhydrAMINE (BENADRYL) capsule 25 mg (25 mg Oral Given 04/09/19 0544)  warfarin (COUMADIN) tablet 7.5 mg (has no administration in time range)  diphenhydrAMINE (BENADRYL) 12.5 MG/5ML elixir 25 mg (25 mg Oral Given 04/08/19 2121)  oxyCODONE-acetaminophen (PERCOCET/ROXICET) 5-325 MG per tablet 2 tablet (2 tablets Oral Given 04/08/19 2317)  warfarin (COUMADIN) tablet 10 mg (10 mg Oral Given 04/09/19 0044)  potassium chloride SA (KLOR-CON) CR tablet 40 mEq (40 mEq Oral Given 04/09/19 1211)    ED Course  I have reviewed the triage vital signs and the nursing notes.  Pertinent labs & imaging results that were available during my care of the patient were reviewed by me and considered in my medical decision making (see chart for details).    MDM Rules/Calculators/A&P                     69 year old patient with elevated BMI comes in a chief complaint of rash.  She has multiple medical comorbidities and osteomyelitis for which she is on IV Zosyn into 6 weeks.  Patient was just discharged yesterday and return to the ED with rash that started developing today.  She reports she had similar allergic reaction several years back at outside facility with IV antibiotics.  She does not recall what antibiotics cause her to have a rash.   Upon our review it appears that patient Zosyn was actually initiated 4 to 5 days ago while she was still in the hospital.  Therefore we decided to get CBC and coags to make sure that the rash was not because of thrombocytopenia, or petechiae because of elevated INR.  Her labs are reassuring.  With normal labs are suspicion is higher that patient's disseminated rash  is because of hypersensitivity.  Other systemic causes also possible but deemed less likely at this time.  Patient will be admitted to the hospital.   Final Clinical Impression(s) / ED Diagnoses Final diagnoses:  Hypersensitivity, initial encounter  Rash and nonspecific skin eruption  Osteomyelitis, unspecified site, unspecified type [facility])    Rx / DC Orders ED Discharge Orders    None       Varney Biles, MD 04/09/19 1601

## 2019-04-09 NOTE — Plan of Care (Signed)

## 2019-04-09 NOTE — Progress Notes (Signed)
[place] for Coumadin Indication: atrial fibrillation  Allergies  Allergen Reactions  . Bee Venom Hives    Takes benadryl  . Other Other (See Comments)    Raw foods with seeds give her "boils".  Avoids raw strawberries, blueberries. Tolerates cooked fruits, tomato sauce, bread/grains with seeds. [facility] 10/17/13 Berries with seeds Allergy to glutamine-C-quercet-selen-brom per [facility] 09/25/17  . Zosyn [Piperacillin Sod-Tazobactam So] Rash    Itchy total body rash.  . Gluten Meal Other (See Comments)    Per MAR  . Alteplase Rash  . Cefepime Rash  . Fentanyl Other (See Comments)    Hallucinations, syncope    . Metformin And Related Diarrhea    Patient Measurements:    Vital Signs: Temp: 97.9 F (36.6 C) (02/06 0325) Temp Source: Oral (02/06 0325) BP: 129/71 (02/06 0325) Pulse Rate: 92 (02/06 0325)  Labs: Recent Labs    04/07/19 0434 04/08/19 1909 04/09/19 0509  HGB  --  10.6* 9.7*  HCT  --  37.2 33.2*  PLT  --  227 192  APTT  --  32  --   LABPROT 19.6* 20.6* 22.2*  INR 1.7* 1.8* 2.0*  CREATININE  --  0.85 0.80    Estimated Creatinine Clearance: 93.1 mL/min (by C-G formula based on SCr of 0.8 mg/dL).   Medical History: Past Medical History:  Diagnosis Date  . CHF (congestive heart failure) ([place])   . Chronic /permanent atrial fibrillation ([place]) 2005   On Warfarin (follwed @ [facility]); Rate controlled - On BB  . Chronic diastolic heart failure, NYHA class 2 ([place])    Updated by A. fib, hypertensive heart disease and obesity; EDP was only 7 by cardiac catheterization in September 2017  . Diabetic lumbosacral plexopathy ([place])   . Fibromyalgia   . Generalized anxiety disorder   . Hypokalemia 09/03/2017  . Incontinence   . Major depressive disorder   . OSA on CPAP   . Restless leg syndrome   . Syncope 09/03/2017  . Type II diabetes mellitus [facility])     Assessment: 69 yo female on chronic Coumadin for  afib, admitted 2/4 with total body rash after starting Zosyn outpatient for sacral osteomyelitis  Pharmacy asked to resume anticoagulation with Coumadin.  PTA warfarin dose a bit confusing. Seems she normally takes Coumadin 8 mg daily but this fluctuates based on INR readings. Was receiving 7.5mg  daily during last admission with discharge 2/4. Did not take any warfarin after discharge and was given 10mg  upon admission 2/6 at ~0100.  Today, INR is therapeutic at 2.0. CBC low but stable and no reported bleeding.   Goal of Therapy:  INR 2-3 Monitor platelets by anticoagulation protocol: Yes   Plan:  - Coumadin 7.5mg  PO x 1 - Daily PT/INR, CBC - Monitor for signs/symptoms of bleeding, antibiotic regimen changes for warfarin interaction   Brendolyn Patty, PharmD PGY2 Pharmacy Resident Phone [phone]  04/09/2019   9:31 AM

## 2019-04-09 NOTE — Progress Notes (Signed)
69 year old female who was admitted early morning due to urticarial rash which is thought to be secondary to drug allergy due to her Zosyn.  Patient seen and examined.  She continues to have some itching and rash.  Rash has improved somewhat.  Her rash is mostly located in the lower back and abdomen area.  It is maculopapular.  No warmth.  Consulted ID.  They recommended switching to Merrem.  Will consult TOC to arrange switching antibiotics from Zosyn to ertapenem as outpatient.

## 2019-04-09 NOTE — Consult Note (Signed)
[facility]    Date of Admission:  04/08/2019   Total days of antibiotics: 7 zosyn               Reason for Consult: Rash, ESBL Klebsiella, Pseudomonas UTI, decubitus ulcer with osteomyelitis   Referring Provider:    Assessment: Rash ESBL Klebsiella, Pseudomonas UTI  Sacral decubitus ulcer with osteomyelitis  Diabetes mellitus OSA A fib  Plan: 1. Stop zosyn 2. Benadryl, H2 blocker as needed.  3. Do not need to start steroids at this point 4. Change anbx to merrem when rash improved. Consider Invanz at d/c (her UTI has been treated)  5. WOC consult.  6. Follow FSG  Thank you so much for this interesting consult,  Principal Problem:   Allergic urticaria Active Problems:   Chronic atrial fibrillation [facility]): CHA2DS2-VASc Score 5; on Warfarin   Type 2 diabetes mellitus (HCC)   Decubitus ulcer of ischium, left, stage IV (HCC)   Sacral osteomyelitis (HCC)   Allergic reaction   . acetaminophen  1,000 mg Oral TID  . ALPRAZolam  0.25 mg Oral TID  . ARIPiprazole  10 mg Oral Daily  . busPIRone  10 mg Oral BID  . Chlorhexidine Gluconate Cloth  6 each Topical Daily  . Dulaglutide  1.5 mg Subcutaneous Q Sat  . ezetimibe  10 mg Oral QHS  . ferrous sulfate  325 mg Oral Q breakfast  . gabapentin  300 mg Oral TID  . insulin aspart  0-15 Units Subcutaneous TID WC  . insulin glargine  25 Units Subcutaneous QHS  . magnesium oxide  400 mg Oral TID  . metoprolol succinate  25 mg Oral Daily  . potassium chloride  40 mEq Oral Q4H  . pramipexole  0.125 mg Oral QHS  . sertraline  100 mg Oral QHS  . spironolactone  12.5 mg Oral Daily  . warfarin  7.5 mg Oral ONCE-1800  . Warfarin - Pharmacist Dosing Inpatient   Does not apply q1800    HPI: Donna [name] is a 69 y.o. female who was hospitalized at [facility] on 1-30 with code stroke and fever/tachycardia/hypotension/hypoxia/mental status change. She was initially adm to ICU, mental status cleared quickly,  and weaned off levophed in 24h.  She was found to have > 100k ESBL K pneumo and 10k pseudomonas in her UCx. She had a CT scan of her sacrum which showed 3.4 x 1.2 cm fluid collection as well as bladder wall thickening concerning for UTI. The abscess was not seen on her f/u MRI (abscess identified as a myelolipoma, also osteo was seen).   She had 1 BCx which grew micoroccus, felt to be a contaminant.  She was treated with vanco/zosyn and was ultimately d/c on zosyn, and went home on 2-4 (end date 05-13-19) She was home < 24 h when she developed a rash on her trunk which she stated spread upward.  She denies fevers, SOB or cough.  No difficulties with her PIC line.   HgB A1C 6.0% (04-03-19)  Review of Systems: Review of Systems  Constitutional: Positive for chills. Negative for fever.  Respiratory: Positive for shortness of breath (DOE at baseline). Negative for cough and sputum production.   Gastrointestinal: Negative for constipation and diarrhea.  Genitourinary: Negative for dysuria.  Skin: Positive for rash.  Please see HPI. All other systems reviewed and negative.   Past Medical History:  Diagnosis Date  . CHF (congestive heart failure) ([place])   .  Chronic /permanent atrial fibrillation ([place]) 2005   On Warfarin (follwed @ [facility]); Rate controlled - On BB  . Chronic diastolic heart failure, NYHA class 2 ([place])    Updated by A. fib, hypertensive heart disease and obesity; EDP was only 7 by cardiac catheterization in September 2017  . Diabetic lumbosacral plexopathy ([place])   . Fibromyalgia   . Generalized anxiety disorder   . Hypokalemia 09/03/2017  . Incontinence   . Major depressive disorder   . OSA on CPAP   . Restless leg syndrome   . Syncope 09/03/2017  . Type II diabetes mellitus (HCC)     Social History   Tobacco Use  . Smoking status: Never Smoker  . Smokeless tobacco: Never Used  Substance Use Topics  . Alcohol use: No  . Drug use: No     Family History  Problem Relation Age of Onset  . Hypertension Mother   . Heart disease Mother   . Leukemia Son      Medications:  Scheduled: . acetaminophen  1,000 mg Oral TID  . ALPRAZolam  0.25 mg Oral TID  . ARIPiprazole  10 mg Oral Daily  . busPIRone  10 mg Oral BID  . Chlorhexidine Gluconate Cloth  6 each Topical Daily  . Dulaglutide  1.5 mg Subcutaneous Q Sat  . ezetimibe  10 mg Oral QHS  . ferrous sulfate  325 mg Oral Q breakfast  . gabapentin  300 mg Oral TID  . insulin aspart  0-15 Units Subcutaneous TID WC  . insulin glargine  25 Units Subcutaneous QHS  . magnesium oxide  400 mg Oral TID  . metoprolol succinate  25 mg Oral Daily  . pramipexole  0.125 mg Oral QHS  . sertraline  100 mg Oral QHS  . spironolactone  12.5 mg Oral Daily  . warfarin  7.5 mg Oral ONCE-1800  . Warfarin - Pharmacist Dosing Inpatient   Does not apply q1800    Abtx:  Anti-infectives (From admission, onward)   Start     Dose/Rate Route Frequency Ordered Stop   04/09/19 0000  meropenem (MERREM) 1 g in sodium chloride 0.9 % 100 mL IVPB     1 g 200 mL/hr over 30 Minutes Intravenous Every 8 hours 04/08/19 2355     04/08/19 2245  piperacillin-tazobactam (ZOSYN) IVPB 3.375 g  Status:  Discontinued     3.375 g 100 mL/hr over 30 Minutes Intravenous  Once 04/08/19 2235 04/08/19 2302        OBJECTIVE: Blood pressure 129/71, pulse 92, temperature 97.9 F (36.6 C), temperature source Oral, resp. rate 18, SpO2 95 %.  Physical Exam Vitals reviewed.  Constitutional:      Appearance: Normal appearance. She is obese.  HENT:     Mouth/Throat:     Mouth: Mucous membranes are moist.     Pharynx: No oropharyngeal exudate.  Eyes:     Extraocular Movements: Extraocular movements intact.     Pupils: Pupils are equal, round, and reactive to light.  Cardiovascular:     Rate and Rhythm: Normal rate and regular rhythm.  Pulmonary:     Effort: Pulmonary effort is normal.     Breath sounds: Normal  breath sounds.  Abdominal:     General: Bowel sounds are normal. There is no distension.     Palpations: Abdomen is soft.     Tenderness: There is no abdominal tenderness.  Musculoskeletal:        General: No swelling or tenderness.  Cervical back: Normal range of motion and neck supple. No rigidity or tenderness.     Right lower leg: Edema present.     Left lower leg: Edema present.  Skin:    Findings: Erythema and rash present. Rash is macular. Rash is not crusting, nodular, papular, purpuric, pustular, scaling, urticarial or vesicular.       Neurological:     Mental Status: She is alert.     Sensory: Sensory deficit present.     Lab Results Results for orders placed or performed during the hospital encounter of 04/08/19 (from the past 48 hour(s))  CBC with Differential     Status: Abnormal   Collection Time: 04/08/19  7:09 PM  Result Value Ref Range   WBC 5.9 4.0 - 10.5 K/uL   RBC 4.61 3.87 - 5.11 MIL/uL   Hemoglobin 10.6 (L) 12.0 - 15.0 g/dL   HCT 37.2 36.0 - 46.0 %   MCV 80.7 80.0 - 100.0 fL   MCH 23.0 (L) 26.0 - 34.0 pg   MCHC 28.5 (L) 30.0 - 36.0 g/dL   RDW 18.6 (H) 11.5 - 15.5 %   Platelets 227 150 - 400 K/uL   nRBC 0.0 0.0 - 0.2 %   Neutrophils Relative % 67 %   Neutro Abs 4.0 1.7 - 7.7 K/uL   Lymphocytes Relative 25 %   Lymphs Abs 1.5 0.7 - 4.0 K/uL   Monocytes Relative 4 %   Monocytes Absolute 0.2 0.1 - 1.0 K/uL   Eosinophils Relative 4 %   Eosinophils Absolute 0.2 0.0 - 0.5 K/uL   Basophils Relative 0 %   Basophils Absolute 0.0 0.0 - 0.1 K/uL   nRBC 1 (H) 0 /100 WBC   Abs Immature Granulocytes 0.00 0.00 - 0.07 K/uL    Comment: Performed at [facility], [address] [place], [place] [address]  Comprehensive metabolic panel     Status: Abnormal   Collection Time: 04/08/19  7:09 PM  Result Value Ref Range   Sodium 140 135 - 145 mmol/L   Potassium 3.5 3.5 - 5.1 mmol/L   Chloride 106 98 - 111 mmol/L   CO2 23 22 - 32 mmol/L   Glucose, Bld 142  (H) 70 - 99 mg/dL   BUN 8 8 - 23 mg/dL   Creatinine, Ser 0.85 0.44 - 1.00 mg/dL   Calcium 9.3 8.9 - 10.3 mg/dL   Total Protein 7.1 6.5 - 8.1 g/dL   Albumin 3.2 (L) 3.5 - 5.0 g/dL   AST 23 15 - 41 U/L   ALT 14 0 - 44 U/L   Alkaline Phosphatase 66 38 - 126 U/L   Total Bilirubin 0.6 0.3 - 1.2 mg/dL   GFR calc non Af Amer >60 >60 mL/min   GFR calc Af Amer >60 >60 mL/min   Anion gap 11 5 - 15    Comment: Performed at [facility] [address] [place], [place] [address]  Protime-INR     Status: Abnormal   Collection Time: 04/08/19  7:09 PM  Result Value Ref Range   Prothrombin Time 20.6 (H) 11.4 - 15.2 seconds   INR 1.8 (H) 0.8 - 1.2    Comment: (NOTE) INR goal varies based on device and disease states. Performed at [facility], [place] [address] [place], [place] [address]   APTT     Status: None   Collection Time: 04/08/19  7:09 PM  Result Value Ref Range   aPTT 32  24 - 36 seconds    Comment: Performed at [facility], [place] [address], [place], [place] [address]  SARS CORONAVIRUS 2 (TAT 6-24 HRS) Nasopharyngeal Nasopharyngeal Swab     Status: None   Collection Time: 04/08/19  8:38 PM   Specimen: Nasopharyngeal Swab  Result Value Ref Range   SARS Coronavirus 2 NEGATIVE NEGATIVE    Comment: (NOTE) SARS-CoV-2 target nucleic acids are NOT DETECTED. The SARS-CoV-2 RNA is generally detectable in upper and lower respiratory specimens during the acute phase of infection. Negative results do not preclude SARS-CoV-2 infection, do not rule out co-infections with other pathogens, and should not be used as the sole basis for treatment or other patient management decisions. Negative results must be combined with clinical observations, patient history, and epidemiological information. The expected result is Negative. Fact Sheet for Patients: SugarRoll.be Fact Sheet for Healthcare Providers: https://www.woods-mathews.com/ This  test is not yet approved or cleared by the [place] FDA and  has been authorized for detection and/or diagnosis of SARS-CoV-2 by FDA under an Emergency Use Authorization (EUA). This EUA will remain  in effect (meaning this test can be used) for the duration of the COVID-19 declaration under Section 56 4(b)(1) of the Act, 21 U.S.C. section 360bbb-3(b)(1), unless the authorization is terminated or revoked sooner. Performed at [facility], [place] [address], [place], [place] [address]   Blood culture (routine x 2)     Status: None (Preliminary result)   Collection Time: 04/08/19 11:10 PM   Specimen: BLOOD LEFT HAND  Result Value Ref Range   Specimen Description BLOOD LEFT HAND    Special Requests      BOTTLES DRAWN AEROBIC AND ANAEROBIC Blood Culture adequate volume   Culture      NO GROWTH < 12 HOURS Performed at [facility], [place] [address], [place], [place], [place] [address]    Report Status PENDING   Blood culture (routine x 2)     Status: None (Preliminary result)   Collection Time: 04/08/19 11:10 PM   Specimen: BLOOD RIGHT ARM  Result Value Ref Range   Specimen Description BLOOD RIGHT ARM    Special Requests      BOTTLES DRAWN AEROBIC AND ANAEROBIC Blood Culture adequate volume   Culture      NO GROWTH < 12 HOURS Performed at [facility], [place] [address], [place], [place] [address]    Report Status PENDING   Protime-INR     Status: Abnormal   Collection Time: 04/09/19  5:09 AM  Result Value Ref Range   Prothrombin Time 22.2 (H) 11.4 - 15.2 seconds   INR 2.0 (H) 0.8 - 1.2    Comment: (NOTE) INR goal varies based on device and disease states. Performed at [facility], [place] [address], [place], [place] [address]   CBC     Status: Abnormal   Collection Time: 04/09/19  5:09 AM  Result Value Ref Range   WBC 5.6 4.0 - 10.5 K/uL   RBC 4.22 3.87 - 5.11 MIL/uL   Hemoglobin 9.7 (L) 12.0 - 15.0 g/dL   HCT 33.2 (L) 36.0 - 46.0 %   MCV 78.7 (L) 80.0 - 100.0  fL   MCH 23.0 (L) 26.0 - 34.0 pg   MCHC 29.2 (L) 30.0 - 36.0 g/dL   RDW 18.7 (H) 11.5 - 15.5 %   Platelets 192 150 - 400 K/uL    Comment: REPEATED TO VERIFY   nRBC 0.0 0.0 -  0.2 %    Comment: Performed at [facility], [place] [address], [place], [place] [address]  Comprehensive metabolic panel     Status: Abnormal   Collection Time: 04/09/19  5:09 AM  Result Value Ref Range   Sodium 141 135 - 145 mmol/L   Potassium 3.1 (L) 3.5 - 5.1 mmol/L   Chloride 108 98 - 111 mmol/L   CO2 26 22 - 32 mmol/L   Glucose, Bld 120 (H) 70 - 99 mg/dL   BUN 6 (L) 8 - 23 mg/dL   Creatinine, Ser 0.80 0.44 - 1.00 mg/dL   Calcium 8.9 8.9 - 10.3 mg/dL   Total Protein 6.2 (L) 6.5 - 8.1 g/dL   Albumin 2.9 (L) 3.5 - 5.0 g/dL   AST 19 15 - 41 U/L   ALT 11 0 - 44 U/L   Alkaline Phosphatase 55 38 - 126 U/L   Total Bilirubin 0.8 0.3 - 1.2 mg/dL   GFR calc non Af Amer >60 >60 mL/min   GFR calc Af Amer >60 >60 mL/min   Anion gap 7 5 - 15    Comment: Performed at [facility], [place] [address] [place], [place] [address]  Glucose, capillary     Status: Abnormal   Collection Time: 04/09/19  7:43 AM  Result Value Ref Range   Glucose-Capillary 136 (H) 70 - 99 mg/dL      Component Value Date/Time   SDES BLOOD LEFT HAND 04/08/2019 2310   SDES BLOOD RIGHT ARM 04/08/2019 2310   SPECREQUEST  04/08/2019 2310    BOTTLES DRAWN AEROBIC AND ANAEROBIC Blood Culture adequate volume   SPECREQUEST  04/08/2019 2310    BOTTLES DRAWN AEROBIC AND ANAEROBIC Blood Culture adequate volume   CULT  04/08/2019 2310    NO GROWTH < 12 HOURS Performed at [facility], [place] [address] [place], [place] [address]    CULT  04/08/2019 2310    NO GROWTH < 12 HOURS Performed at [facility], [place] [address], [place],  [address]    REPTSTATUS PENDING 04/08/2019 2310   REPTSTATUS PENDING 04/08/2019 2310   No results found. Recent Results (from the past 240 hour(s))  Blood culture (routine x 2)     Status:  Abnormal   Collection Time: 04/02/19  9:35 PM   Specimen: BLOOD  Result Value Ref Range Status   Specimen Description BLOOD RIGHT ARM  Final   Special Requests   Final    BOTTLES DRAWN AEROBIC AND ANAEROBIC Blood Culture adequate volume   Culture  Setup Time   Final    GRAM POSITIVE COCCI IN CLUSTERS AEROBIC BOTTLE ONLY CRITICAL RESULT CALLED TO, READ BACK BY AND VERIFIED WITH: PHARMD JEREMY F. [id number]     Culture (A)  Final    MICROCOCCUS LUTEUS/LYLAE Standardized susceptibility testing for this organism is not available. Performed at [facility], [place] [address], [place],  [address]    Report Status 04/06/2019 FINAL  Final  Blood culture (routine x 2)     Status: None   Collection Time: 04/02/19  9:35 PM   Specimen: BLOOD  Result Value Ref Range Status   Specimen Description BLOOD RIGHT ARM  Final   Special Requests   Final    BOTTLES DRAWN AEROBIC AND ANAEROBIC Blood Culture results may not be optimal due to an inadequate volume of blood received in culture bottles   Culture   Final    NO GROWTH 5  DAYS Performed at [facility], [place] [address], [place], [place] [address]    Report Status 04/07/2019 FINAL  Final  Respiratory Panel by RT PCR (Flu A&B, Covid) - Nasopharyngeal Swab     Status: None   Collection Time: 04/02/19 11:36 PM   Specimen: Nasopharyngeal Swab  Result Value Ref Range Status   SARS Coronavirus 2 by RT PCR NEGATIVE NEGATIVE Final    Comment: (NOTE) SARS-CoV-2 target nucleic acids are NOT DETECTED. The SARS-CoV-2 RNA is generally detectable in upper respiratoy specimens during the acute phase of infection. The lowest concentration of SARS-CoV-2 viral copies this assay can detect is 131 copies/mL. A negative result does not preclude SARS-Cov-2 infection and should not be used as the sole basis for treatment or other patient management decisions. A negative result may occur with  improper specimen collection/handling, submission of  specimen other than nasopharyngeal swab, presence of viral mutation(s) within the areas targeted by this assay, and inadequate number of viral copies (<131 copies/mL). A negative result must be combined with clinical observations, patient history, and epidemiological information. The expected result is Negative. Fact Sheet for Patients:  PinkCheek.be Fact Sheet for Healthcare Providers:  GravelBags.it This test is not yet ap proved or cleared by the [place] FDA and  has been authorized for detection and/or diagnosis of SARS-CoV-2 by FDA under an Emergency Use Authorization (EUA). This EUA will remain  in effect (meaning this test can be used) for the duration of the COVID-19 declaration under Section 564(b)(1) of the Act, 21 U.S.C. section 360bbb-3(b)(1), unless the authorization is terminated or revoked sooner.    Influenza A by PCR NEGATIVE NEGATIVE Final   Influenza B by PCR NEGATIVE NEGATIVE Final    Comment: (NOTE) The Xpert Xpress SARS-CoV-2/FLU/RSV assay is intended as an aid in  the diagnosis of influenza from Nasopharyngeal swab specimens and  should not be used as a sole basis for treatment. Nasal washings and  aspirates are unacceptable for Xpert Xpress SARS-CoV-2/FLU/RSV  testing. Fact Sheet for Patients: PinkCheek.be Fact Sheet for Healthcare Providers: GravelBags.it This test is not yet approved or cleared by the [place] FDA and  has been authorized for detection and/or diagnosis of SARS-CoV-2 by  FDA under an Emergency Use Authorization (EUA). This EUA will remain  in effect (meaning this test can be used) for the duration of the  Covid-19 declaration under Section 564(b)(1) of the Act, 21  U.S.C. section 360bbb-3(b)(1), unless the authorization is  terminated or revoked. Performed at [facility], [place] [address], [place],  [place] [address]   Urine culture     Status: Abnormal   Collection Time: 04/02/19 11:43 PM   Specimen: In/Out Cath Urine  Result Value Ref Range Status   Specimen Description IN/OUT CATH URINE  Final   Special Requests STERILE CONTAINER  Final   Culture (A)  Final    >=100,000 COLONIES/mL KLEBSIELLA PNEUMONIAE 10,000 COLONIES/mL PSEUDOMONAS AERUGINOSA Confirmed Extended Spectrum Beta-Lactamase Producer (ESBL).  In bloodstream infections from ESBL organisms, carbapenems are preferred over piperacillin/tazobactam. They are shown to have a lower risk of mortality. FOR KLEBSIELLA PNEUMONIAE Performed at [facility], [place] [address], [place], [place] [address]    Report Status 04/05/2019 FINAL  Final   Organism ID, Bacteria KLEBSIELLA PNEUMONIAE (A)  Final   Organism ID, Bacteria PSEUDOMONAS AERUGINOSA (A)  Final      Susceptibility   Klebsiella pneumoniae - MIC*    AMPICILLIN >=32 RESISTANT Resistant  CEFAZOLIN >=64 RESISTANT Resistant     CEFTRIAXONE >=64 RESISTANT Resistant     CIPROFLOXACIN 1 SENSITIVE Sensitive     GENTAMICIN >=16 RESISTANT Resistant     IMIPENEM 0.5 SENSITIVE Sensitive     NITROFURANTOIN 64 INTERMEDIATE Intermediate     TRIMETH/SULFA >=320 RESISTANT Resistant     AMPICILLIN/SULBACTAM >=32 RESISTANT Resistant     PIP/TAZO 16 SENSITIVE Sensitive     * >=100,000 COLONIES/mL KLEBSIELLA PNEUMONIAE   Pseudomonas aeruginosa - MIC*    CEFTAZIDIME 8 SENSITIVE Sensitive     CIPROFLOXACIN 0.5 SENSITIVE Sensitive     GENTAMICIN >=16 RESISTANT Resistant     IMIPENEM 2 SENSITIVE Sensitive     PIP/TAZO 16 SENSITIVE Sensitive     CEFEPIME 4 SENSITIVE Sensitive     * 10,000 COLONIES/mL PSEUDOMONAS AERUGINOSA  MRSA PCR Screening     Status: None   Collection Time: 04/03/19  6:01 AM   Specimen: Nasopharyngeal  Result Value Ref Range Status   MRSA by PCR NEGATIVE NEGATIVE Final    Comment:        The GeneXpert MRSA Assay (FDA approved for NASAL specimens only), is one  component of a comprehensive MRSA colonization surveillance program. It is not intended to diagnose MRSA infection nor to guide or monitor treatment for MRSA infections. Performed at [facility], [place] [address], [place], [place] [address]   Culture, blood (Routine X 2) w Reflex to ID Panel     Status: None (Preliminary result)   Collection Time: 04/05/19 10:19 AM   Specimen: BLOOD RIGHT ARM  Result Value Ref Range Status   Specimen Description BLOOD RIGHT ARM  Final   Special Requests   Final    BOTTLES DRAWN AEROBIC ONLY Blood Culture adequate volume   Culture   Final    NO GROWTH 4 DAYS Performed at [facility], [address] [place], [place] [address]    Report Status PENDING  Incomplete  Culture, blood (Routine X 2) w Reflex to ID Panel     Status: None (Preliminary result)   Collection Time: 04/05/19 10:25 AM   Specimen: BLOOD LEFT ARM  Result Value Ref Range Status   Specimen Description BLOOD LEFT ARM  Final   Special Requests   Final    BOTTLES DRAWN AEROBIC ONLY Blood Culture adequate volume   Culture   Final    NO GROWTH 4 DAYS Performed at [facility], [place] [address] [place], [place] [address]    Report Status PENDING  Incomplete  SARS CORONAVIRUS 2 (TAT 6-24 HRS) Nasopharyngeal Nasopharyngeal Swab     Status: None   Collection Time: 04/08/19  8:38 PM   Specimen: Nasopharyngeal Swab  Result Value Ref Range Status   SARS Coronavirus 2 NEGATIVE NEGATIVE Final    Comment: (NOTE) SARS-CoV-2 target nucleic acids are NOT DETECTED. The SARS-CoV-2 RNA is generally detectable in upper and lower respiratory specimens during the acute phase of infection. Negative results do not preclude SARS-CoV-2 infection, do not rule out co-infections with other pathogens, and should not be used as the sole basis for treatment or other patient management decisions. Negative results must be combined with clinical observations, patient history, and  epidemiological information. The expected result is Negative. Fact Sheet for Patients: SugarRoll.be Fact Sheet for Healthcare Providers: https://www.woods-mathews.com/ This test is not yet approved or cleared by the [place] FDA and  has been authorized for detection and/or diagnosis of SARS-CoV-2 by FDA under an Emergency Use  Authorization (EUA). This EUA will remain  in effect (meaning this test can be used) for the duration of the COVID-19 declaration under Section 56 4(b)(1) of the Act, 21 U.S.C. section 360bbb-3(b)(1), unless the authorization is terminated or revoked sooner. Performed at [facility], [place] [address] [place], [place] [address]   Blood culture (routine x 2)     Status: None (Preliminary result)   Collection Time: 04/08/19 11:10 PM   Specimen: BLOOD LEFT HAND  Result Value Ref Range Status   Specimen Description BLOOD LEFT HAND  Final   Special Requests   Final    BOTTLES DRAWN AEROBIC AND ANAEROBIC Blood Culture adequate volume   Culture   Final    NO GROWTH < 12 HOURS Performed at [facility], [place] [address], [place], [place] [address]    Report Status PENDING  Incomplete  Blood culture (routine x 2)     Status: None (Preliminary result)   Collection Time: 04/08/19 11:10 PM   Specimen: BLOOD RIGHT ARM  Result Value Ref Range Status   Specimen Description BLOOD RIGHT ARM  Final   Special Requests   Final    BOTTLES DRAWN AEROBIC AND ANAEROBIC Blood Culture adequate volume   Culture   Final    NO GROWTH < 12 HOURS Performed at [facility], [place] [address] [place], [place] [address]    Report Status PENDING  Incomplete    Microbiology: Recent Results (from the past 240 hour(s))  Blood culture (routine x 2)     Status: Abnormal   Collection Time: 04/02/19  9:35 PM   Specimen: BLOOD  Result Value Ref Range Status   Specimen Description BLOOD RIGHT ARM  Final   Special Requests   Final      BOTTLES DRAWN AEROBIC AND ANAEROBIC Blood Culture adequate volume   Culture  Setup Time   Final    GRAM POSITIVE COCCI IN CLUSTERS AEROBIC BOTTLE ONLY CRITICAL RESULT CALLED TO, READ BACK BY AND VERIFIED WITH: PHARMD JEREMY F. [id number]     Culture (A)  Final    MICROCOCCUS LUTEUS/LYLAE Standardized susceptibility testing for this organism is not available. Performed at [facility], [place] [address] [place], [place] [address]    Report Status 04/06/2019 FINAL  Final  Blood culture (routine x 2)     Status: None   Collection Time: 04/02/19  9:35 PM   Specimen: BLOOD  Result Value Ref Range Status   Specimen Description BLOOD RIGHT ARM  Final   Special Requests   Final    BOTTLES DRAWN AEROBIC AND ANAEROBIC Blood Culture results may not be optimal due to an inadequate volume of blood received in culture bottles   Culture   Final    NO GROWTH 5 DAYS Performed at [facility], [place] [address], [place], [place] [address]    Report Status 04/07/2019 FINAL  Final  Respiratory Panel by RT PCR (Flu A&B, Covid) - Nasopharyngeal Swab     Status: None   Collection Time: 04/02/19 11:36 PM   Specimen: Nasopharyngeal Swab  Result Value Ref Range Status   SARS Coronavirus 2 by RT PCR NEGATIVE NEGATIVE Final    Comment: (NOTE) SARS-CoV-2 target nucleic acids are NOT DETECTED. The SARS-CoV-2 RNA is generally detectable in upper respiratoy specimens during the acute phase of infection. The lowest concentration of SARS-CoV-2 viral copies this assay can detect is 131 copies/mL. A negative result does not preclude SARS-Cov-2 infection  and should not be used as the sole basis for treatment or other patient management decisions. A negative result may occur with  improper specimen collection/handling, submission of specimen other than nasopharyngeal swab, presence of viral mutation(s) within the areas targeted by this assay, and inadequate number of viral copies (<131 copies/mL). A  negative result must be combined with clinical observations, patient history, and epidemiological information. The expected result is Negative. Fact Sheet for Patients:  PinkCheek.be Fact Sheet for Healthcare Providers:  GravelBags.it This test is not yet ap proved or cleared by the [place] FDA and  has been authorized for detection and/or diagnosis of SARS-CoV-2 by FDA under an Emergency Use Authorization (EUA). This EUA will remain  in effect (meaning this test can be used) for the duration of the COVID-19 declaration under Section 564(b)(1) of the Act, 21 U.S.C. section 360bbb-3(b)(1), unless the authorization is terminated or revoked sooner.    Influenza A by PCR NEGATIVE NEGATIVE Final   Influenza B by PCR NEGATIVE NEGATIVE Final    Comment: (NOTE) The Xpert Xpress SARS-CoV-2/FLU/RSV assay is intended as an aid in  the diagnosis of influenza from Nasopharyngeal swab specimens and  should not be used as a sole basis for treatment. Nasal washings and  aspirates are unacceptable for Xpert Xpress SARS-CoV-2/FLU/RSV  testing. Fact Sheet for Patients: PinkCheek.be Fact Sheet for Healthcare Providers: GravelBags.it This test is not yet approved or cleared by the [place] FDA and  has been authorized for detection and/or diagnosis of SARS-CoV-2 by  FDA under an Emergency Use Authorization (EUA). This EUA will remain  in effect (meaning this test can be used) for the duration of the  Covid-19 declaration under Section 564(b)(1) of the Act, 21  U.S.C. section 360bbb-3(b)(1), unless the authorization is  terminated or revoked. Performed at [facility], [place] [address], [place], [place] [address]   Urine culture     Status: Abnormal   Collection Time: 04/02/19 11:43 PM   Specimen: In/Out Cath Urine  Result Value Ref Range Status   Specimen Description  IN/OUT CATH URINE  Final   Special Requests STERILE CONTAINER  Final   Culture (A)  Final    >=100,000 COLONIES/mL KLEBSIELLA PNEUMONIAE 10,000 COLONIES/mL PSEUDOMONAS AERUGINOSA Confirmed Extended Spectrum Beta-Lactamase Producer (ESBL).  In bloodstream infections from ESBL organisms, carbapenems are preferred over piperacillin/tazobactam. They are shown to have a lower risk of mortality. FOR KLEBSIELLA PNEUMONIAE Performed at [facility], [place] [address], [place], [place] [address]    Report Status 04/05/2019 FINAL  Final   Organism ID, Bacteria KLEBSIELLA PNEUMONIAE (A)  Final   Organism ID, Bacteria PSEUDOMONAS AERUGINOSA (A)  Final      Susceptibility   Klebsiella pneumoniae - MIC*    AMPICILLIN >=32 RESISTANT Resistant     CEFAZOLIN >=64 RESISTANT Resistant     CEFTRIAXONE >=64 RESISTANT Resistant     CIPROFLOXACIN 1 SENSITIVE Sensitive     GENTAMICIN >=16 RESISTANT Resistant     IMIPENEM 0.5 SENSITIVE Sensitive     NITROFURANTOIN 64 INTERMEDIATE Intermediate     TRIMETH/SULFA >=320 RESISTANT Resistant     AMPICILLIN/SULBACTAM >=32 RESISTANT Resistant     PIP/TAZO 16 SENSITIVE Sensitive     * >=100,000 COLONIES/mL KLEBSIELLA PNEUMONIAE   Pseudomonas aeruginosa - MIC*    CEFTAZIDIME 8 SENSITIVE Sensitive     CIPROFLOXACIN 0.5 SENSITIVE Sensitive     GENTAMICIN >=16 RESISTANT Resistant     IMIPENEM 2 SENSITIVE Sensitive  PIP/TAZO 16 SENSITIVE Sensitive     CEFEPIME 4 SENSITIVE Sensitive     * 10,000 COLONIES/mL PSEUDOMONAS AERUGINOSA  MRSA PCR Screening     Status: None   Collection Time: 04/03/19  6:01 AM   Specimen: Nasopharyngeal  Result Value Ref Range Status   MRSA by PCR NEGATIVE NEGATIVE Final    Comment:        The GeneXpert MRSA Assay (FDA approved for NASAL specimens only), is one component of a comprehensive MRSA colonization surveillance program. It is not intended to diagnose MRSA infection nor to guide or monitor treatment for MRSA  infections. Performed at [facility], [place] [address], [place], [place] [address]   Culture, blood (Routine X 2) w Reflex to ID Panel     Status: None (Preliminary result)   Collection Time: 04/05/19 10:19 AM   Specimen: BLOOD RIGHT ARM  Result Value Ref Range Status   Specimen Description BLOOD RIGHT ARM  Final   Special Requests   Final    BOTTLES DRAWN AEROBIC ONLY Blood Culture adequate volume   Culture   Final    NO GROWTH 4 DAYS Performed at [facility], [address], [place], [place] [address]    Report Status PENDING  Incomplete  Culture, blood (Routine X 2) w Reflex to ID Panel     Status: None (Preliminary result)   Collection Time: 04/05/19 10:25 AM   Specimen: BLOOD LEFT ARM  Result Value Ref Range Status   Specimen Description BLOOD LEFT ARM  Final   Special Requests   Final    BOTTLES DRAWN AEROBIC ONLY Blood Culture adequate volume   Culture   Final    NO GROWTH 4 DAYS Performed at [facility], [place] [address], [place], [place] [address]    Report Status PENDING  Incomplete  SARS CORONAVIRUS 2 (TAT 6-24 HRS) Nasopharyngeal Nasopharyngeal Swab     Status: None   Collection Time: 04/08/19  8:38 PM   Specimen: Nasopharyngeal Swab  Result Value Ref Range Status   SARS Coronavirus 2 NEGATIVE NEGATIVE Final    Comment: (NOTE) SARS-CoV-2 target nucleic acids are NOT DETECTED. The SARS-CoV-2 RNA is generally detectable in upper and lower respiratory specimens during the acute phase of infection. Negative results do not preclude SARS-CoV-2 infection, do not rule out co-infections with other pathogens, and should not be used as the sole basis for treatment or other patient management decisions. Negative results must be combined with clinical observations, patient history, and epidemiological information. The expected result is Negative. Fact Sheet for Patients: SugarRoll.be Fact Sheet for Healthcare  Providers: https://www.woods-mathews.com/ This test is not yet approved or cleared by the [place] FDA and  has been authorized for detection and/or diagnosis of SARS-CoV-2 by FDA under an Emergency Use Authorization (EUA). This EUA will remain  in effect (meaning this test can be used) for the duration of the COVID-19 declaration under Section 56 4(b)(1) of the Act, 21 U.S.C. section 360bbb-3(b)(1), unless the authorization is terminated or revoked sooner. Performed at [facility], [place] [address], [place],  [address]   Blood culture (routine x 2)     Status: None (Preliminary result)   Collection Time: 04/08/19 11:10 PM   Specimen: BLOOD LEFT HAND  Result Value Ref Range Status   Specimen Description BLOOD LEFT HAND  Final   Special Requests   Final    BOTTLES DRAWN AEROBIC AND ANAEROBIC Blood Culture adequate volume  Culture   Final    NO GROWTH < 12 HOURS Performed at [facility], [place] [address] [place], [place] [address]    Report Status PENDING  Incomplete  Blood culture (routine x 2)     Status: None (Preliminary result)   Collection Time: 04/08/19 11:10 PM   Specimen: BLOOD RIGHT ARM  Result Value Ref Range Status   Specimen Description BLOOD RIGHT ARM  Final   Special Requests   Final    BOTTLES DRAWN AEROBIC AND ANAEROBIC Blood Culture adequate volume   Culture   Final    NO GROWTH < 12 HOURS Performed at [facility], [place] [address], [place], [place] [address]    Report Status PENDING  Incomplete    Radiographs and labs were personally reviewed by me.   Bobby Rumpf, MD [facility] [place] [facility] [phone] 04/09/2019, 10:49 AM

## 2019-04-09 NOTE — Progress Notes (Signed)
Patient states that she does not wear CPAP and doesn't want to wear it here.

## 2019-04-09 NOTE — H&P (Signed)
History and Physical    Donna [name] AGT:[id number] DOB: 07-14-1950 DOA: 04/08/2019  PCP: Janine Limbo, PA-C  Patient coming from: Home  I have personally briefly reviewed patient's old medical records in [place]  Chief Complaint: Rash  HPI: Donna [name] is a 69 y.o. female with medical history significant of DM2, OSA on CPAP, a.fib on coumadin, chronic sacral decubitus ulcer.  Patient was admitted on 1/30 to ICU in septic shock.  Source was either urine (which grew out 100k CFU of ESBL klebsellia and 10k CFU of pseudomonas), and/or sacral osteomyelitis.  Initiated on broad spectrum ABx with zosyn / vanc at that time.  Pt improved, ID consulted, recd just continuing zosyn for 6 weeks.  Discharged home on 2/4.  Morning of 2/5 at 3am takes zosyn at home for first time (though she had been on it at hospital during whole admit).  Goes to bed, and wakes up AM of 3/5 with itchy rash over entire body.  Presents to ED.   ED Course: Given benadryl in ED.  Pt confirms zosyn is the ONLY new medication she is on after hospital stay.  All other meds she has been on for several months at least.   Review of Systems: As per HPI, otherwise all review of systems negative.  Past Medical History:  Diagnosis Date  . CHF (congestive heart failure) ([place])   . Chronic /permanent atrial fibrillation ([place]) 2005   On Warfarin (follwed @ [facility]); Rate controlled - On BB  . Chronic diastolic heart failure, NYHA class 2 ([place])    Updated by A. fib, hypertensive heart disease and obesity; EDP was only 7 by cardiac catheterization in September 2017  . Diabetic lumbosacral plexopathy ([place])   . Fibromyalgia   . Generalized anxiety disorder   . Hypokalemia 09/03/2017  . Incontinence   . Major depressive disorder   . OSA on CPAP   . Restless leg syndrome   . Syncope 09/03/2017  . Type II diabetes mellitus ([place])     Past Surgical History:  Procedure Laterality Date  .  CARDIAC CATHETERIZATION  11/09/2015   [facility]: Nonocclusive CAD. EF 55%. EDP 7 mmHg.  Marland Kitchen CHOLECYSTECTOMY OPEN    . JOINT REPLACEMENT    . KNEE ARTHROSCOPY Right   . MASS EXCISION Left    sarcoma - shoulder  . SHOULDER OPEN ROTATOR CUFF REPAIR Bilateral   . TONSILLECTOMY    . TOTAL HIP ARTHROPLASTY Right 2015  . TOTAL HIP REVISION Right 2015  . TOTAL KNEE ARTHROPLASTY Right   . TRANSTHORACIC ECHOCARDIOGRAM  11/2013; 02/2016   a) [facility]: with Definity:  EF 60-65%. Mod LA dilation. Mild AoV sclerosis. Normal RHP. Indeterminate LV filling pressures. b) [facility]: Normal cavity size. EF 55-60%.no RWMA, mild AoV sclerosis (R cusp), Mod MAC. Mild biAtrial dilation.  . TRANSTHORACIC ECHOCARDIOGRAM  08/2017    Mild LVH with normal EF 55-60%.  Unable to fully assess wall motion, but appear to be normal.  Unable to determine diastolic function because of A. fib.  Mild to moderate MAC with mild mitral stenosis (mean gradient 9 mmHg).  Moderate LA dilation.  CVP estimated 3 mmHg.  No obvious evidence of pulmonary hypertension.  Marland Kitchen VAGINAL HYSTERECTOMY       reports that she has never smoked. She has never used smokeless tobacco. She reports that she does not drink alcohol or use drugs.  Allergies  Allergen Reactions  . Bee Venom Hives  Takes benadryl  . Other Other (See Comments)    Raw foods with seeds give her "boils".  Avoids raw strawberries, blueberries. Tolerates cooked fruits, tomato sauce, bread/grains with seeds. [facility] 10/17/13 Berries with seeds Allergy to glutamine-C-quercet-selen-brom per [facility] 09/25/17  . Zosyn [Piperacillin Sod-Tazobactam So] Rash    Itchy total body rash.  . Gluten Meal Other (See Comments)    Per MAR  . Alteplase Rash  . Cefepime Rash  . Fentanyl Other (See Comments)    Hallucinations, syncope    . Metformin And Related Diarrhea    Family History  Problem Relation Age of Onset  . Hypertension Mother   . Heart disease Mother   . Leukemia Son       Prior to Admission medications   Medication Sig Start Date End Date Taking? Authorizing Provider  acetaminophen (TYLENOL) 500 MG tablet Take 1,000 mg by mouth 3 (three) times daily.    Yes [provider]  ALPRAZolam (XANAX) 0.5 MG tablet Take 0.25 mg by mouth 3 (three) times daily.   Yes [provider]  ARIPiprazole (ABILIFY) 10 MG tablet Take 10 mg by mouth daily.   Yes [provider]  busPIRone (BUSPAR) 10 MG tablet Take 10 mg by mouth 2 (two) times daily.    Yes [provider]  docusate sodium (COLACE) 100 MG capsule Take 100 mg by mouth daily as needed for mild constipation.   Yes [provider]  Dulaglutide (TRULICITY) 1.5 SN/0.5LZ SOPN Inject 1.5 mg into the skin every Saturday.    Yes [provider]  ezetimibe (ZETIA) 10 MG tablet Take 10 mg by mouth at bedtime.    Yes [provider]  ferrous sulfate 325 (65 FE) MG tablet Take 325 mg by mouth daily with breakfast.    Yes [provider]  furosemide (LASIX) 20 MG tablet Take 1 tablet (20 mg total) by mouth daily. 04/13/19  Yes Johnson, Clanford L, MD  gabapentin (NEURONTIN) 300 MG capsule Take 1 capsule (300 mg total) by mouth 3 (three) times daily. 11/19/18  Yes Domenic Polite, MD  Insulin Glargine (BASAGLAR KWIKPEN) 100 UNIT/ML SOPN Inject 0.25 mLs (25 Units total) into the skin at bedtime. 04/07/19  Yes Johnson, Clanford L, MD  insulin regular (NOVOLIN R) 100 units/mL injection Inject 0-12 Units into the skin 3 (three) times daily before meals. 0-200 0 units 201-250 6 units 251-300 8 units 301-350 10 units 351-400 12 units Greater than 400 call MD   Yes [provider]  lidocaine (LIDODERM) 5 % Place 1 patch onto the skin daily as needed (right hip pain). Remove & Discard patch within 12 hours or as directed by MD    Yes [provider]  loperamide (IMODIUM A-D) 2 MG tablet Take 2 mg by mouth 4 (four) times daily as needed for diarrhea or  loose stools.   Yes [provider]  magnesium oxide (MAG-OX) 400 MG tablet Take 1 tablet (400 mg total) by mouth 3 (three) times daily. ALSO TAKE A EXTRA 400 MG TABLET ON THE DAYS YOU TAKE ZAROXOLYN TABLET Patient taking differently: Take 400 mg by mouth 3 (three) times daily.  02/02/18  Yes Leonie Man, MD  metoprolol succinate (TOPROL-XL) 25 MG 24 hr tablet Take 1 tablet (25 mg total) by mouth daily. 04/07/19  Yes Johnson, Clanford L, MD  omeprazole (PRILOSEC) 20 MG capsule Take 20 mg by mouth daily as needed (acid reflux/indigestion).    Yes [provider]  oxyCODONE (  OXY IR/ROXICODONE) 5 MG immediate release tablet Take 1 tablet (5 mg total) by mouth every 6 (six) hours as needed for severe pain. 04/07/19  Yes Johnson, Clanford L, MD  potassium chloride SA (KLOR-CON) 20 MEQ tablet Take 1 tablet (20 mEq total) by mouth daily. TAKE AN EXTRA 20 MEQ ON THE DAYS YOU TAKE ZAROXLYN 04/13/19  Yes Johnson, Clanford L, MD  pramipexole (MIRAPEX) 0.125 MG tablet Take 0.125 mg by mouth at bedtime.    Yes [provider]  sertraline (ZOLOFT) 100 MG tablet Take 100 mg by mouth at bedtime.    Yes [provider]  spironolactone (ALDACTONE) 25 MG tablet Take 0.5 tablets (12.5 mg total) by mouth daily. 04/08/19  Yes Johnson, Clanford L, MD  warfarin (COUMADIN) 1 MG tablet Take 1 mg by mouth See admin instructions. Take as directed by MD per weekly levels   Yes [provider]  warfarin (JANTOVEN) 2 MG tablet Take 2 mg by mouth See admin instructions. Take as directed by MD per weekly levels   Yes [provider]  warfarin (JANTOVEN) 3 MG tablet Take 3 mg by mouth See admin instructions. Take as directed by MD per weekly levels   Yes [provider]  warfarin (JANTOVEN) 5 MG tablet Take 5 mg by mouth See admin instructions. Take as directed by MD per weekly levels   Yes [provider]    Physical Exam: Vitals:   04/08/19 2230 04/08/19 2300  04/08/19 2330 04/08/19 2345  BP: (!) 143/69 127/64 124/87 (!) 135/47  Pulse:  99 97 91  Resp:      Temp:      TempSrc:      SpO2:  92% 96% 94%    Constitutional: NAD, calm, comfortable Eyes: PERRL, lids and conjunctivae normal ENMT: Mucous membranes are moist. Posterior pharynx clear of any exudate or lesions.Normal dentition.  Neck: normal, supple, no masses, no thyromegaly Respiratory: clear to auscultation bilaterally, no wheezing, no crackles. Normal respiratory effort. No accessory muscle use.  Cardiovascular: Regular rate and rhythm, no murmurs / rubs / gallops. No extremity edema. 2+ pedal pulses. No carotid bruits.  Abdomen: no tenderness, no masses palpated. No hepatosplenomegaly. Bowel sounds positive.  Musculoskeletal: no clubbing / cyanosis. No joint deformity upper and lower extremities. Good ROM, no contractures. Normal muscle tone.  Skin: puritic rash on trunk, legs, C/W urticaria Neurologic: CN 2-12 grossly intact. Sensation intact, DTR normal. Strength 5/5 in all 4.  Psychiatric: Normal judgment and insight. Alert and oriented x 3. Normal mood.    Labs on Admission: I have personally reviewed following labs and imaging studies  CBC: Recent Labs  Lab 04/02/19 2100 04/02/19 2100 04/02/19 2112 04/02/19 2142 04/03/19 0401 04/04/19 0338 04/08/19 1909  WBC 12.5*  --   --   --  17.0* 7.1 5.9  NEUTROABS 11.7*  --   --   --   --   --  4.0  HGB 11.9*   < > 13.9 13.6 11.5* 10.9* 10.6*  HCT 40.4   < > 41.0 40.0 39.3 37.3 37.2  MCV 79.5*  --   --   --  79.4* 79.2* 80.7  PLT 207  --   --   --  264 176 227   < > = values in this interval not displayed.   Basic Metabolic Panel: Recent Labs  Lab 04/03/19 0401 04/04/19 0338 04/05/19 0240 04/06/19 0053 04/07/19 0434 04/08/19 1909  NA 138 135 136 136  --  140  K 3.1* 3.3* 3.4* 3.7  --  3.5  CL 103 97* 100 100  --  106  CO2 _0 --  23  GLUCOSE 208* 152* 149* 162*  --  142*  BUN _1 --  8   CREATININE 0.92 0.87 0.82 0.80  --  0.85  CALCIUM 8.3* 9.1 8.8* 8.8*  --  9.3  MG 1.6* 1.7 1.5* 1.7 1.6*  --    GFR: Estimated Creatinine Clearance: 87.6 mL/min (by C-G formula based on SCr of 0.85 mg/dL). Liver Function Tests: Recent Labs  Lab 04/02/19 2100 04/08/19 1909  AST 26 23  ALT 9 14  ALKPHOS 81 66  BILITOT 1.5* 0.6  PROT 7.4 7.1  ALBUMIN 3.7 3.2*   No results for input(s): LIPASE, AMYLASE in the last 168 hours. Recent Labs  Lab 04/02/19 2135  AMMONIA 37*   Coagulation Profile: Recent Labs  Lab 04/04/19 0338 04/05/19 1401 04/06/19 0053 04/07/19 0434 04/08/19 1909  INR 1.8* 1.8* 1.9* 1.7* 1.8*   Cardiac Enzymes: No results for input(s): CKTOTAL, CKMB, CKMBINDEX, TROPONINI in the last 168 hours. BNP (last 3 results) No results for input(s): PROBNP in the last 8760 hours. HbA1C: No results for input(s): HGBA1C in the last 72 hours. CBG: Recent Labs  Lab 04/06/19 0842 04/06/19 1724 04/06/19 2144 04/07/19 0751 04/07/19 1212  GLUCAP 177* 118* 145* 146* 162*   Lipid Profile: No results for input(s): CHOL, HDL, LDLCALC, TRIG, CHOLHDL, LDLDIRECT in the last 72 hours. Thyroid Function Tests: No results for input(s): TSH, T4TOTAL, FREET4, T3FREE, THYROIDAB in the last 72 hours. Anemia Panel: No results for input(s): VITAMINB12, FOLATE, FERRITIN, TIBC, IRON, RETICCTPCT in the last 72 hours. Urine analysis:    Component Value Date/Time   COLORURINE YELLOW 04/02/2019 2343   APPEARANCEUR CLOUDY (A) 04/02/2019 2343   LABSPEC 1.014 04/02/2019 2343   PHURINE 5.0 04/02/2019 2343   GLUCOSEU NEGATIVE 04/02/2019 2343   HGBUR SMALL (A) 04/02/2019 2343   BILIRUBINUR NEGATIVE 04/02/2019 [place] 04/02/2019 2343   PROTEINUR NEGATIVE 04/02/2019 2343   UROBILINOGEN 1.0 09/06/2010 1130   NITRITE NEGATIVE 04/02/2019 2343   LEUKOCYTESUR LARGE (A) 04/02/2019 2343    Radiological Exams on Admission: No results found.  EKG: Independently reviewed.   Assessment/Plan Principal Problem:   Allergic urticaria Active Problems:   Chronic atrial fibrillation [facility]): CHA2DS2-VASc Score 5; on Warfarin   Type 2 diabetes mellitus ([place])   Decubitus ulcer of ischium, left, stage IV (HCC)   Sacral osteomyelitis ([place])    1. Allergic reaction to zosyn - 1. Allergic urticaria, apparently to zosyn which was only new med 2. Benadryl in ED 3. No anaphalaxis at this time.  Rash stable / itching improved with benadryl, therefore will hold off on starting steroids for the moment  4. Stop zosyn and switch to merrem 5. Admit for overnight obs 2. Sacral osteomyelitis and ulcer - 1. Wound care consult while in hospital 2. Switching zosyn to merrem as noted above 3. Had ESBL Klebsellia in urine, during admit not sure if contaminate or not 4. Touch base with ED in AM to see if they have any alternate recommendations for ABx vs 6 weeks of merrem. 1. Dont really want to hold all ABx and let therapy lapse though given that she was just in septic shock and admitted to ICU 7 days ago on 1/30.  So merrem it is for tonight. 3. Chronic A.Fib - 1. Cont coumadin 2.  Cont metoprolol 4. DM2 - 1. Cont Lantus 2. Mod scale SSI AC  DVT prophylaxis: Coumadin Code Status: Full Family Communication: No family in room Disposition Plan: Home after admit Consults called: None Admission status: Place in 77   [place], [place] Hospitalists  How to contact the [facility] Attending or Consulting provider [place] or covering provider during after hours [place], for this patient?  1. Check the care team in [facility] and look for a) attending/consulting [facility] provider listed and b) the [facility] team listed 2. Log into [email or web address]  Amion Physician Scheduling and messaging for groups and whole hospitals  On call and physician scheduling software for group practices, residents, hospitalists and other medical providers for call, clinic, rotation and shift schedules. OnCall Enterprise is  a hospital-wide system for scheduling doctors and paging doctors on call. EasyPlot is for scientific plotting and data analysis.  [email or web address]  and use [place]'s universal password to access. If you do not have the password, please contact the hospital operator.  3. Locate the [facility] provider you are looking for under [facility] and page to a number that you can be directly reached. 4. If you still have difficulty reaching the provider, please page the [name] (Director on Call) for the Hospitalists listed on amion for assistance.  04/09/2019, 12:31 AM

## 2019-04-09 NOTE — Progress Notes (Signed)
Pt reported she is not allergic to gluten and requesting it to be removed from her allergy list. Dietary services and pharmacy made aware. Will continue to monitor.

## 2019-04-10 DIAGNOSIS — L89159 Pressure ulcer of sacral region, unspecified stage: Secondary | ICD-10-CM

## 2019-04-10 LAB — CULTURE, BLOOD (ROUTINE X 2)
Culture: NO GROWTH
Culture: NO GROWTH
Special Requests: ADEQUATE
Special Requests: ADEQUATE

## 2019-04-10 LAB — BASIC METABOLIC PANEL
Anion gap: 8 (ref 5–15)
BUN: 6 mg/dL — ABNORMAL LOW (ref 8–23)
CO2: 27 mmol/L (ref 22–32)
Calcium: 9.2 mg/dL (ref 8.9–10.3)
Chloride: 108 mmol/L (ref 98–111)
Creatinine, Ser: 0.8 mg/dL (ref 0.44–1.00)
GFR calc Af Amer: >60 mL/min (ref >60)
GFR calc non Af Amer: >60 mL/min (ref >60)
Glucose, Bld: 145 mg/dL — ABNORMAL HIGH (ref 70–99)
Potassium: 4 mmol/L (ref 3.5–5.1)
Sodium: 143 mmol/L (ref 135–145)

## 2019-04-10 LAB — GLUCOSE, CAPILLARY
Glucose-Capillary: 141 mg/dL — ABNORMAL HIGH (ref 70–99)
Glucose-Capillary: 209 mg/dL — ABNORMAL HIGH (ref 70–99)
Glucose-Capillary: 109 mg/dL — ABNORMAL HIGH (ref 70–99)
Glucose-Capillary: 161 mg/dL — ABNORMAL HIGH (ref 70–99)

## 2019-04-10 LAB — CBC
HCT: 37.4 % (ref 36.0–46.0)
Hemoglobin: 10.5 g/dL — ABNORMAL LOW (ref 12.0–15.0)
MCH: 22.9 pg — ABNORMAL LOW (ref 26.0–34.0)
MCHC: 28.1 g/dL — ABNORMAL LOW (ref 30.0–36.0)
MCV: 81.5 fL (ref 80.0–100.0)
Platelets: 207 10*3/uL (ref 150–400)
RBC: 4.59 MIL/uL (ref 3.87–5.11)
RDW: 19.2 % — ABNORMAL HIGH (ref 11.5–15.5)
WBC: 5.4 10*3/uL (ref 4.0–10.5)
nRBC: 0 % (ref 0.0–0.2)

## 2019-04-10 LAB — PROTIME-INR
INR: 2.6 — ABNORMAL HIGH (ref 0.8–1.2)
Prothrombin Time: 28 seconds — ABNORMAL HIGH (ref 11.4–15.2)

## 2019-04-10 MED ORDER — WARFARIN SODIUM 5 MG PO TABS
5.0000 mg | ORAL_TABLET | Freq: Once | ORAL | Status: AC
  Administered 2019-04-10: 5 mg via ORAL
  Filled 2019-04-10: qty 1

## 2019-04-10 MED ORDER — NYSTATIN 100000 UNIT/GM EX POWD: Freq: Three times a day (TID) | CUTANEOUS | 0 refills | Status: AC

## 2019-04-10 MED ORDER — SODIUM CHLORIDE 0.9 % IV SOLN
1.0000 g | INTRAVENOUS | Status: DC
  Administered 2019-04-10: 16:00:00 1000 mg via INTRAVENOUS
  Filled 2019-04-10 (×3): qty 1

## 2019-04-10 MED ORDER — ERTAPENEM IV (FOR PTA / DISCHARGE USE ONLY): 1.0000 g | INTRAVENOUS | 0 refills | Status: DC

## 2019-04-10 MED ORDER — NYSTATIN 100000 UNIT/GM EX POWD
Freq: Three times a day (TID) | CUTANEOUS | Status: DC
  Administered 2019-04-10: 1 via TOPICAL
  Filled 2019-04-10 (×3): qty 15

## 2019-04-10 NOTE — Discharge Summary (Signed)
Physician Discharge Summary  MAIJA BIGGERS HXT:[id number] DOB: 08/14/1950 DOA: 04/08/2019  PCP: Janine Limbo, PA-C  Admit date: 04/08/2019 Discharge date: 04/10/2019  Admitted From: Home Disposition: Home  Recommendations for Outpatient Follow-up:  1. Follow up with PCP in 1-2 weeks 2. Please obtain BMP/CBC in one week 3. Please follow up on the following pending results:  Home Health: Yes Equipment/Devices: Infusion device  Discharge Condition: Stable CODE STATUS: Full code Diet recommendation: Cardiac  Subjective: In and examined.  Feels much better today.  No new complaint.  Brief/Interim Summary: YARETHZI BRANAN is a 69 y.o. female with medical history significant of DM2, OSA on CPAP, a.fib on coumadin, chronic sacral decubitus ulcer. Patient was admitted on 1/30 to ICU in septic shock.  Source was either urine (which grew out 100k CFU of ESBL klebsellia and 10k CFU of pseudomonas), and/or sacral osteomyelitis.  Initiated on broad spectrum ABx with zosyn / vanc at that time. Pt improved, ID consulted, recd just continuing zosyn for 6 weeks.  Discharged home on 2/4. Morning of 2/5 at 3am takes zosyn at home for first time (though she had been on it at hospital during whole admit).  Goes to bed, and wakes up AM of 3/5 with itchy rash over entire body.  Presents to ED. given Benadryl.  Admitted to hospital service for ID to be reconsulted.  Started on meropenem.  ID consulted.  They agreed that patient's urticaria was secondary to Zosyn and recommended continuing ertapenem as outpatient which has been arranged for her and in the meantime, her rash has improved and she remained hemodynamically stable so she is going to be discharged today.  Per recommendations from home health agency, patient will receive first dose of ertapenem today before discharge.    Discharge Diagnoses:  Principal Problem:   Allergic urticaria Active Problems:   Chronic atrial fibrillation ([place]): CHA2DS2-VASc Score 5; on  Warfarin   Type 2 diabetes mellitus (HCC)   Decubitus ulcer of ischium, left, stage IV (HCC)   Sacral osteomyelitis (HCC)   Allergic reaction    Discharge Instructions  Discharge Instructions    Discharge patient   Complete by: As directed    Discharge disposition: 06-Home-Health Care Svc   Discharge patient date: 04/10/2019   Home infusion instructions   Complete by: As directed    Instructions: Flushing of vascular access device: 0.9% NaCl pre/post medication administration and prn patency; Heparin 100 u/ml, 44m for implanted ports and Heparin 10u/ml, 553mfor all other central venous catheters.     Allergies as of 04/10/2019      Reactions   Bee Venom Hives   Takes benadryl   Other Other (See Comments)   Raw foods with seeds give her "boils".  Avoids raw strawberries, blueberries. Tolerates cooked fruits, tomato sauce, bread/grains with seeds. [facility] Berries with seeds Allergy to [facility]   Zosyn [piperacillin Sod-tazobactam So] Rash   Itchy total body rash.   Alteplase Rash   Cefepime Rash   Fentanyl Other (See Comments)   Hallucinations, syncope     Metformin And Related Diarrhea   Vancomycin Rash      Medication List    TAKE these medications   acetaminophen 500 MG tablet Commonly known as: TYLENOL Take 1,000 mg by mouth 3 (three) times daily.   ALPRAZolam 0.5 MG tablet Commonly known as: XANAX Take 0.25 mg by mouth 3 (three) times daily.   ARIPiprazole 10 MG tablet Commonly known as: ABILIFY Take 10 mg  by mouth daily.   Basaglar KwikPen 100 UNIT/ML Sopn Inject 0.25 mLs (25 Units total) into the skin at bedtime.   busPIRone 10 MG tablet Commonly known as: BUSPAR Take 10 mg by mouth 2 (two) times daily.   docusate sodium 100 MG capsule Commonly known as: COLACE Take 100 mg by mouth daily as needed for mild constipation.   ertapenem  IVPB Commonly known as: INVANZ Inject 1 g into the vein daily. Indication:  sacral osteomyelitis Last Day of Therapy:  05/13/19 Labs - Once weekly:  CBC/D and BMP, Labs - Every other week:  ESR and CRP   ezetimibe 10 MG tablet Commonly known as: ZETIA Take 10 mg by mouth at bedtime.   ferrous sulfate 325 (65 FE) MG tablet Take 325 mg by mouth daily with breakfast.   furosemide 20 MG tablet Commonly known as: LASIX Take 1 tablet (20 mg total) by mouth daily. Start taking on: April 13, 2019   gabapentin 300 MG capsule Commonly known as: NEURONTIN Take 1 capsule (300 mg total) by mouth 3 (three) times daily.   insulin regular 100 units/mL injection Commonly known as: NOVOLIN R Inject 0-12 Units into the skin 3 (three) times daily before meals. 0-200 0 units 201-250 6 units 251-300 8 units 301-350 10 units 351-400 12 units Greater than 400 call MD   Jantoven 5 MG tablet Generic drug: warfarin Take 5 mg by mouth See admin instructions. Take as directed by MD per weekly levels   Jantoven 2 MG tablet Generic drug: warfarin Take 2 mg by mouth See admin instructions. Take as directed by MD per weekly levels   warfarin 1 MG tablet Commonly known as: COUMADIN Take 1 mg by mouth See admin instructions. Take as directed by MD per weekly levels   Jantoven 3 MG tablet Generic drug: warfarin Take 3 mg by mouth See admin instructions. Take as directed by MD per weekly levels   lidocaine 5 % Commonly known as: LIDODERM Place 1 patch onto the skin daily as needed (right hip pain). Remove & Discard patch within 12 hours or as directed by MD   loperamide 2 MG tablet Commonly known as: IMODIUM A-D Take 2 mg by mouth 4 (four) times daily as needed for diarrhea or loose stools.   magnesium oxide 400 MG tablet Commonly known as: MAG-OX Take 1 tablet (400 mg total) by mouth 3 (three) times daily. ALSO TAKE A EXTRA 400 MG TABLET ON THE DAYS YOU TAKE ZAROXOLYN TABLET What changed: additional instructions   metoprolol succinate 25 MG 24 hr tablet Commonly  known as: TOPROL-XL Take 1 tablet (25 mg total) by mouth daily.   nystatin powder Commonly known as: MYCOSTATIN/NYSTOP Apply topically 3 (three) times daily for 10 days. Apply to abdominal folds and folds under the breast/redness area.   omeprazole 20 MG capsule Commonly known as: PRILOSEC Take 20 mg by mouth daily as needed (acid reflux/indigestion).   oxyCODONE 5 MG immediate release tablet Commonly known as: Oxy IR/ROXICODONE Take 1 tablet (5 mg total) by mouth every 6 (six) hours as needed for severe pain.   potassium chloride SA 20 MEQ tablet Commonly known as: KLOR-CON Take 1 tablet (20 mEq total) by mouth daily. TAKE AN EXTRA 65 MEQ ON THE DAYS YOU TAKE ZAROXLYN Start taking on: April 13, 2019   pramipexole 0.125 MG tablet Commonly known as: MIRAPEX Take 0.125 mg by mouth at bedtime.   sertraline 100 MG tablet Commonly known as: ZOLOFT Take 100 mg  by mouth at bedtime.   spironolactone 25 MG tablet Commonly known as: ALDACTONE Take 0.5 tablets (12.5 mg total) by mouth daily.   Trulicity 1.5 VQ/2.5ZD Sopn Generic drug: Dulaglutide Inject 1.5 mg into the skin every Saturday.            Home Infusion Instuctions  (From admission, onward)         Start     Ordered   04/10/19 0000  Home infusion instructions    Question:  Instructions  Answer:  Flushing of vascular access device: 0.9% NaCl pre/post medication administration and prn patency; Heparin 100 u/ml, 54m for implanted ports and Heparin 10u/ml, 53mfor all other central venous catheters.   04/10/19 1152         Follow-up Information    O'Buch, Greta, PA-C Follow up in 1 week(s).   Specialty: Internal Medicine Contact information: [phone]        HaLeonie ManMD .   Specialty: Cardiology Contact information: [address] [place] [phone]        Allergies  Allergen Reactions  . Bee Venom  Hives    Takes benadryl  . Other Other (See Comments)    Raw foods with seeds give her "boils".  Avoids raw strawberries, blueberries. Tolerates cooked fruits, tomato sauce, bread/grains with seeds. [facility] Berries with seeds Allergy to glutamine-[facility]sel[facility]  . Zosyn [Piperacillin Sod-Tazobactam So] Rash    Itchy total body rash.  . Alteplase Rash  . Cefepime Rash  . Fentanyl Other (See Comments)    Hallucinations, syncope    . Metformin And Related Diarrhea  . Vancomycin Rash    Consultations: ID   Procedures/Studies: CT ABDOMEN PELVIS W CONTRAST  Result Date: 04/03/2019 CLINICAL DATA:  Abdominal pain, diarrhea, sepsis EXAM: CT ABDOMEN AND PELVIS WITH CONTRAST TECHNIQUE: Multidetector CT imaging of the abdomen and pelvis was performed using the standard protocol following bolus administration of intravenous contrast. CONTRAST:  12538mMNIPAQUE IOHEXOL 300 MG/ML  SOLN COMPARISON:  CT abdomen pelvis 01/28/2019, CT abdomen pelvis 09/03/2017 FINDINGS: Lower chest: Lung bases are clear. Borderline cardiomegaly. Dense mitral annular calcifications. Coronary artery calcifications are present. No pericardial effusion. Hepatobiliary: No focal liver abnormality is seen. Patient is post cholecystectomy. Slight prominence of the biliary tree likely related to reservoir effect. No calcified intraductal gallstones. Pancreas: Unremarkable. No pancreatic ductal dilatation or surrounding inflammatory changes. Spleen: Mild splenomegaly. No focal splenic lesions. Adrenals/Urinary Tract: Normal adrenal glands. There is bilateral perinephric stranding, nonspecific. Multiple bilateral fluid attenuation cysts are seen in both kidneys, largest is a bilobed cysts in the left upper pole measuring 2.4 cm. Bilateral cortical scarring is noted as well. There is mild right hydroureteronephrosis without visible obstructing calculus though the distal ureter and ureterovesicular junction is  largely obscured by streak artifact from patient's right hip prosthesis. No left urinary tract dilatation or calcification. Question some mild faint perivesicular stranding. Stomach/Bowel: Distal esophagus, stomach and duodenal sweep are unremarkable. No small bowel wall thickening or dilatation. No evidence of obstruction. A normal appendix is visualized. Scattered colonic diverticula without focal pericolonic inflammation to suggest diverticulitis. Vascular/Lymphatic: Atherosclerotic plaque within the normal caliber aorta. No suspicious or enlarged lymph nodes in the included lymphatic chains. Reproductive: Uterus is surgically absent. No concerning adnexal lesions. Other: No abdominopelvic free fluid or air. Right abdominal wall laxity. Circumferential body wall edema. There is focal rim enhancing fluid collection superficial  to the sacrum measuring 3.4 x 1.2 cm (3/68). Small punctate focus of gas is noted as well (7/81). Additionally, there is an indeterminate presacral structure, either fluid collection or intermediate attenuation mass measuring 50 Hounsfield units. This is not significantly changed in size or appearance from 16/55/3748 though certainly reflect some interval increase in size measuring up to 4 cm on today's exam, previously 3 cm on study from September 03, 2017. Musculoskeletal: There is right abdominal wall laxity with notable atrophy of the right rectus sheath. Multilevel degenerative changes are present in the spine including bony fusion across many of the lower lumbar levels and severe interspinous arthrosis. Abrupt angulation of the sacrococcygeal junction is noted and is in close proximity to presacral collection, clear these are related findings. IMPRESSION: 1. 3.4 x 1.2 cm rim enhancing fluid collection superficial to the sacrum is suspicious for abscess. Punctate foci is of adjacent gas could be seen with decubitus ulceration but more aggressive soft tissue infection should be excluded on a  clinical basis. 2. Indeterminate presacral lesion, increasing in size since 2019. Could be related to the adjacent fracture of the sacrococcygeal junction though overall remains indeterminate. 3. Bladder wall thickening with some urothelial thickening particular on the right concerning for potential ascending urinary tract infection. No visualized urolithiasis though distal evaluation is limited by streak from right hip arthroplasty. 4. Circumferential body wall edema. Correlate for features of anasarca. 5.  Aortic Atherosclerosis (ICD10-I70.0). And Critical Value/emergent results were called by telephone at the time of interpretation on 04/03/2019 at 1:45 am to provider [facility] , who verbally acknowledged these results. Electronically Signed   By: Lovena Le M.D.   On: 04/03/2019 01:45   MR SACRUM SI JOINTS W WO CONTRAST  Result Date: 04/06/2019 CLINICAL DATA:  Chronic sacral decubitus ulcer. EXAM: MRI PELVIS WITHOUT AND WITH CONTRAST TECHNIQUE: Multiplanar multisequence MR imaging of the pelvis was performed both before and after administration of intravenous contrast. CONTRAST:  8m GADAVIST GADOBUTROL 1 MMOL/ML IV SOLN COMPARISON:  CT scan 04/03/2019 FINDINGS: There is a deep sacral decubitus ulcer extending down adjacent to the lower sacral segments. Enhancing inflammatory granulation tissue around the ulcer but no abscess. Mild surrounding cellulitis. No evidence of myofasciitis involving the atrophied gluteal muscles. Small focus of abnormal T2 signal intensity and subsequent enhancement in the distal sacral segment consistent with osteomyelitis. Again noted is a 5 cm presacral mass. This has areas of fatty signal intensity and is most likely a benign sacral myelolipoma. No findings for septic arthritis involving the SI joints. Significant artifact from a right hip prosthesis. No intrapelvic abscess is identified. Moderate to large amount of stool is noted in the rectum. IMPRESSION: 1. Deep sacral  decubitus ulcer extending down to the lower sacral segments. 2. Small focus of osteomyelitis involving the distal sacral segment. 3. No drainable soft tissue abscess or intrapelvic abscess. 4. 5 cm presacral mass most likely a benign sacral myelolipoma. Electronically Signed   By: PMarijo SanesM.D.   On: 04/06/2019 08:10   DG Chest Port 1 View  Result Date: 04/06/2019 CLINICAL DATA:  Status post PICC placement. EXAM: PORTABLE CHEST 1 VIEW COMPARISON:  Single-view of the chest 04/02/2019. FINDINGS: Right PICC is in place with the tip projecting in the lower superior vena cava. Lungs are clear. No pneumothorax or pleural effusion. Cardiomegaly noted. The patient is status post right shoulder surgery. IMPRESSION: Tip of right PICC projects in the lower superior vena cava. Cardiomegaly. Atherosclerosis. Electronically Signed   By:  Inge Rise M.D.   On: 04/06/2019 10:53   DG Chest Port 1 View  Result Date: 04/02/2019 CLINICAL DATA:  Cough.  Hypoxia. EXAM: PORTABLE CHEST 1 VIEW COMPARISON:  01/28/2019 FINDINGS: The heart size is enlarged. Aortic calcifications are noted. There is no pneumothorax. No large pleural effusion. No focal infiltrate. Degenerative changes are noted of both glenohumeral joints and both AC joints. There is no obvious acute osseous abnormality. IMPRESSION: No active disease. Electronically Signed   By: Constance Holster M.D.   On: 04/02/2019 22:37   CT HEAD CODE STROKE WO CONTRAST  Result Date: 04/02/2019 CLINICAL DATA:  Code stroke. 69 year old female with abnormal speech. EXAM: CT HEAD WITHOUT CONTRAST TECHNIQUE: Contiguous axial images were obtained from the base of the skull through the vertex without intravenous contrast. COMPARISON:  Brain MRI 06/18/2011. Head CT 09/24/2017. FINDINGS: Brain: Chronic partially empty sella. Stable cerebral volume. No midline shift, ventriculomegaly, mass effect, evidence of mass lesion, intracranial hemorrhage or evidence of cortically based  acute infarction. Mild for age scattered white matter hypodensity appears stable. No cortical encephalomalacia identified. Vascular: Calcified atherosclerosis at the skull base. No suspicious intracranial vascular hyperdensity. Skull: No acute osseous abnormality identified. Sinuses/Orbits: Visualized paranasal sinuses and mastoids are stable and well pneumatized. Other: No acute orbit or scalp soft tissue finding. ASPECTS [facility] Stroke Program Early CT Score) Total score (0-10 with 10 being normal): 10 IMPRESSION: Stable since 2019 and largely negative for age noncontrast CT appearance of the brain. ASPECTS 10. These results were communicated to Dr. Lorraine Lax at 9:16 pm on 1/30/2021by text page via the [facility] messaging system. Electronically Signed   By: Genevie Ann M.D.   On: 04/02/2019 21:17   [place] EKG SITE RITE  Result Date: 04/04/2019 If Site Rite image not attached, placement could not be confirmed due to current cardiac rhythm.     Discharge Exam: Vitals:   04/09/19 2022 04/10/19 0517  BP: 109/64 116/69  Pulse: 78 83  Resp: 18 18  Temp: 98.7 F (37.1 C) 97.8 F (36.6 C)  SpO2: 95% 93%   Vitals:   04/09/19 0325 04/09/19 1443 04/09/19 2022 04/10/19 0517  BP: 129/71 112/82 109/64 116/69  Pulse: 92 92 78 83  Resp: '18 20 18 18  '$ Temp: 97.9 F (36.6 C) 98.1 F (36.7 C) 98.7 F (37.1 C) 97.8 F (36.6 C)  TempSrc: Oral Oral Oral Oral  SpO2: 95% 94% 95% 93%    General: Pt is alert, awake, not in acute distress, obese Cardiovascular: RRR, S1/S2 +, no rubs, no gallops Respiratory: CTA bilaterally, no wheezing, no rhonchi Abdominal: Soft, NT, ND, bowel sounds + Extremities: Trace pitting edema bilateral lower extremity, no cyanosis    The results of significant diagnostics from this hospitalization (including imaging, microbiology, ancillary and laboratory) are listed below for reference.     Microbiology: Recent Results (from the past 240 hour(s))  Blood culture (routine x 2)      Status: Abnormal   Collection Time: 04/02/19  9:35 PM   Specimen: BLOOD  Result Value Ref Range Status   Specimen Description BLOOD RIGHT ARM  Final   Special Requests   Final    BOTTLES DRAWN AEROBIC AND ANAEROBIC Blood Culture adequate volume   Culture  Setup Time   Final    GRAM POSITIVE COCCI IN CLUSTERS AEROBIC BOTTLE ONLY CRITICAL RESULT CALLED TO, READ BACK BY AND VERIFIED WITH: [place] [id number]     Culture (A)  Final    MICROCOCCUS LUTEUS/LYLAE  Standardized susceptibility testing for this organism is not available. Performed at [facility], [place] [address], [place], [place] [address]    Report Status 04/06/2019 FINAL  Final  Blood culture (routine x 2)     Status: None   Collection Time: 04/02/19  9:35 PM   Specimen: BLOOD  Result Value Ref Range Status   Specimen Description BLOOD RIGHT ARM  Final   Special Requests   Final    BOTTLES DRAWN AEROBIC AND ANAEROBIC Blood Culture results may not be optimal due to an inadequate volume of blood received in culture bottles   Culture   Final    NO GROWTH 5 DAYS Performed at [facility], [place] [address] [place], [place] [address]    Report Status 04/07/2019 FINAL  Final  Respiratory Panel by RT PCR (Flu A&B, Covid) - Nasopharyngeal Swab     Status: None   Collection Time: 04/02/19 11:36 PM   Specimen: Nasopharyngeal Swab  Result Value Ref Range Status   SARS Coronavirus 2 by RT PCR NEGATIVE NEGATIVE Final    Comment: (NOTE) SARS-CoV-2 target nucleic acids are NOT DETECTED. The SARS-CoV-2 RNA is generally detectable in upper respiratoy specimens during the acute phase of infection. The lowest concentration of SARS-CoV-2 viral copies this assay can detect is 131 copies/mL. A negative result does not preclude SARS-Cov-2 infection and should not be used as the sole basis for treatment or other patient management decisions. A negative result may occur with  improper specimen collection/handling, submission of  specimen other than nasopharyngeal swab, presence of viral mutation(s) within the areas targeted by this assay, and inadequate number of viral copies (<131 copies/mL). A negative result must be combined with clinical observations, patient history, and epidemiological information. The expected result is Negative. Fact Sheet for Patients:  PinkCheek.be Fact Sheet for Healthcare Providers:  GravelBags.it This test is not yet ap proved or cleared by the [place] FDA and  has been authorized for detection and/or diagnosis of SARS-CoV-2 by FDA under an Emergency Use Authorization (EUA). This EUA will remain  in effect (meaning this test can be used) for the duration of the COVID-19 declaration under Section 564(b)(1) of the Act, 21 U.S.C. section 360bbb-3(b)(1), unless the authorization is terminated or revoked sooner.    Influenza A by PCR NEGATIVE NEGATIVE Final   Influenza B by PCR NEGATIVE NEGATIVE Final    Comment: (NOTE) The Xpert Xpress SARS-CoV-2/FLU/RSV assay is intended as an aid in  the diagnosis of influenza from Nasopharyngeal swab specimens and  should not be used as a sole basis for treatment. Nasal washings and  aspirates are unacceptable for Xpert Xpress SARS-CoV-2/FLU/RSV  testing. Fact Sheet for Patients: PinkCheek.be Fact Sheet for Healthcare Providers: GravelBags.it This test is not yet approved or cleared by the [place] FDA and  has been authorized for detection and/or diagnosis of SARS-CoV-2 by  FDA under an Emergency Use Authorization (EUA). This EUA will remain  in effect (meaning this test can be used) for the duration of the  Covid-19 declaration under Section 564(b)(1) of the Act, 21  U.S.C. section 360bbb-3(b)(1), unless the authorization is  terminated or revoked. Performed at [facility], [place] [address], [place],  [place] [address]   Urine culture     Status: Abnormal   Collection Time: 04/02/19 11:43 PM   Specimen: In/Out Cath Urine  Result Value Ref Range Status   Specimen Description IN/OUT CATH URINE  Final   Special Requests  STERILE CONTAINER  Final   Culture (A)  Final    >=100,000 COLONIES/mL KLEBSIELLA PNEUMONIAE 10,000 COLONIES/mL PSEUDOMONAS AERUGINOSA Confirmed Extended Spectrum Beta-Lactamase Producer (ESBL).  In bloodstream infections from ESBL organisms, carbapenems are preferred over piperacillin/tazobactam. They are shown to have a lower risk of mortality. FOR KLEBSIELLA PNEUMONIAE Performed at [facility], [place] [address] [place], [place] [address]    Report Status 04/05/2019 FINAL  Final   Organism ID, Bacteria KLEBSIELLA PNEUMONIAE (A)  Final   Organism ID, Bacteria PSEUDOMONAS AERUGINOSA (A)  Final      Susceptibility   Klebsiella pneumoniae - MIC*    AMPICILLIN >=32 RESISTANT Resistant     CEFAZOLIN >=64 RESISTANT Resistant     CEFTRIAXONE >=64 RESISTANT Resistant     CIPROFLOXACIN 1 SENSITIVE Sensitive     GENTAMICIN >=16 RESISTANT Resistant     IMIPENEM 0.5 SENSITIVE Sensitive     NITROFURANTOIN 64 INTERMEDIATE Intermediate     TRIMETH/SULFA >=320 RESISTANT Resistant     AMPICILLIN/SULBACTAM >=32 RESISTANT Resistant     PIP/TAZO 16 SENSITIVE Sensitive     * >=100,000 COLONIES/mL KLEBSIELLA PNEUMONIAE   Pseudomonas aeruginosa - MIC*    CEFTAZIDIME 8 SENSITIVE Sensitive     CIPROFLOXACIN 0.5 SENSITIVE Sensitive     GENTAMICIN >=16 RESISTANT Resistant     IMIPENEM 2 SENSITIVE Sensitive     PIP/TAZO 16 SENSITIVE Sensitive     CEFEPIME 4 SENSITIVE Sensitive     * 10,000 COLONIES/mL PSEUDOMONAS AERUGINOSA  MRSA PCR Screening     Status: None   Collection Time: 04/03/19  6:01 AM   Specimen: Nasopharyngeal  Result Value Ref Range Status   MRSA by PCR NEGATIVE NEGATIVE Final    Comment:        The GeneXpert MRSA Assay (FDA approved for NASAL specimens only), is one  component of a comprehensive MRSA colonization surveillance program. It is not intended to diagnose MRSA infection nor to guide or monitor treatment for MRSA infections. Performed at [facility], [place] [address], [place], [place] [address]   Culture, blood (Routine X 2) w Reflex to ID Panel     Status: None   Collection Time: 04/05/19 10:19 AM   Specimen: BLOOD RIGHT ARM  Result Value Ref Range Status   Specimen Description BLOOD RIGHT ARM  Final   Special Requests   Final    BOTTLES DRAWN AEROBIC ONLY Blood Culture adequate volume   Culture   Final    NO GROWTH 5 DAYS Performed at [facility], [address] [place], [place] [address]    Report Status 04/10/2019 FINAL  Final  Culture, blood (Routine X 2) w Reflex to ID Panel     Status: None   Collection Time: 04/05/19 10:25 AM   Specimen: BLOOD LEFT ARM  Result Value Ref Range Status   Specimen Description BLOOD LEFT ARM  Final   Special Requests   Final    BOTTLES DRAWN AEROBIC ONLY Blood Culture adequate volume   Culture   Final    NO GROWTH 5 DAYS Performed at [facility], [place] [address], [place], [place] [address]    Report Status 04/10/2019 FINAL  Final  SARS CORONAVIRUS 2 (TAT 6-24 HRS) Nasopharyngeal Nasopharyngeal Swab     Status: None   Collection Time: 04/08/19  8:38 PM   Specimen: Nasopharyngeal Swab  Result Value Ref Range Status   SARS Coronavirus 2 NEGATIVE NEGATIVE Final    Comment: (NOTE) SARS-CoV-2  target nucleic acids are NOT DETECTED. The SARS-CoV-2 RNA is generally detectable in upper and lower respiratory specimens during the acute phase of infection. Negative results do not preclude SARS-CoV-2 infection, do not rule out co-infections with other pathogens, and should not be used as the sole basis for treatment or other patient management decisions. Negative results must be combined with clinical observations, patient history, and epidemiological information. The  expected result is Negative. Fact Sheet for Patients: SugarRoll.be Fact Sheet for Healthcare Providers: https://www.woods-mathews.com/ This test is not yet approved or cleared by the [place] FDA and  has been authorized for detection and/or diagnosis of SARS-CoV-2 by FDA under an Emergency Use Authorization (EUA). This EUA will remain  in effect (meaning this test can be used) for the duration of the COVID-19 declaration under Section 56 4(b)(1) of the Act, 21 U.S.C. section 360bbb-3(b)(1), unless the authorization is terminated or revoked sooner. Performed at [facility], [place] [address] [place], [place] [address]   Blood culture (routine x 2)     Status: None (Preliminary result)   Collection Time: 04/08/19 11:10 PM   Specimen: BLOOD LEFT HAND  Result Value Ref Range Status   Specimen Description BLOOD LEFT HAND  Final   Special Requests   Final    BOTTLES DRAWN AEROBIC AND ANAEROBIC Blood Culture adequate volume   Culture   Final    NO GROWTH 2 DAYS Performed at [facility], [place] [address], [place], [place] [address]    Report Status PENDING  Incomplete  Blood culture (routine x 2)     Status: None (Preliminary result)   Collection Time: 04/08/19 11:10 PM   Specimen: BLOOD RIGHT ARM  Result Value Ref Range Status   Specimen Description BLOOD RIGHT ARM  Final   Special Requests   Final    BOTTLES DRAWN AEROBIC AND ANAEROBIC Blood Culture adequate volume   Culture   Final    NO GROWTH 2 DAYS Performed at [facility], [place] [address] [place], [place] [address]    Report Status PENDING  Incomplete     Labs: BNP (last 3 results) No results for input(s): BNP in the last 8760 hours. Basic Metabolic Panel: Recent Labs  Lab 04/04/19 0338 04/04/19 0338 04/05/19 0240 04/06/19 0053 04/07/19 0434 04/08/19 1909 04/09/19 0509 04/10/19 0857  NA 135   < > 136 136  --  140 141 143  K 3.3*   < > 3.4* 3.7  --   3.5 3.1* 4.0  CL 97*   < > 100 100  --  106 108 108  CO2 26   < > 26 26  --  '23 26 27  '$ GLUCOSE 152*   < > 149* 162*  --  142* 120* 145*  BUN 10   < > 11 11  --  8 6* 6*  CREATININE 0.87   < > 0.82 0.80  --  0.85 0.80 0.80  CALCIUM 9.1   < > 8.8* 8.8*  --  9.3 8.9 9.2  MG 1.7  --  1.5* 1.7 1.6*  --   --   --    < > = values in this interval not displayed.   Liver Function Tests: Recent Labs  Lab 04/08/19 1909 04/09/19 0509  AST 23 19  ALT 14 11  ALKPHOS 66 55  BILITOT 0.6 0.8  PROT 7.1 6.2*  ALBUMIN 3.2* 2.9*   No results for input(s): LIPASE, AMYLASE in the last  168 hours. No results for input(s): AMMONIA in the last 168 hours. CBC: Recent Labs  Lab 04/04/19 0338 04/08/19 1909 04/09/19 0509 04/10/19 0857  WBC 7.1 5.9 5.6 5.4  NEUTROABS  --  4.0  --   --   HGB 10.9* 10.6* 9.7* 10.5*  HCT 37.3 37.2 33.2* 37.4  MCV 79.2* 80.7 78.7* 81.5  PLT 176 227 192 207   Cardiac Enzymes: No results for input(s): CKTOTAL, CKMB, CKMBINDEX, TROPONINI in the last 168 hours. BNP: Invalid input(s): POCBNP CBG: Recent Labs  Lab 04/09/19 1205 04/09/19 1616 04/09/19 2119 04/10/19 0758 04/10/19 1201  GLUCAP 152* 145* 132* 141* 209*   D-Dimer No results for input(s): DDIMER in the last 72 hours. Hgb A1c No results for input(s): HGBA1C in the last 72 hours. Lipid Profile No results for input(s): CHOL, HDL, LDLCALC, TRIG, CHOLHDL, LDLDIRECT in the last 72 hours. Thyroid function studies No results for input(s): TSH, T4TOTAL, T3FREE, THYROIDAB in the last 72 hours.  Invalid input(s): FREET3 Anemia work up No results for input(s): VITAMINB12, FOLATE, FERRITIN, TIBC, IRON, RETICCTPCT in the last 72 hours. Urinalysis    Component Value Date/Time   COLORURINE YELLOW 04/02/2019 2343   APPEARANCEUR CLOUDY (A) 04/02/2019 2343   LABSPEC 1.014 04/02/2019 2343   PHURINE 5.0 04/02/2019 2343   GLUCOSEU NEGATIVE 04/02/2019 2343   HGBUR SMALL (A) 04/02/2019 2343   BILIRUBINUR NEGATIVE  04/02/2019 [place] 04/02/2019 2343   PROTEINUR NEGATIVE 04/02/2019 2343   UROBILINOGEN 1.0 09/06/2010 1130   NITRITE NEGATIVE 04/02/2019 2343   LEUKOCYTESUR LARGE (A) 04/02/2019 2343   Sepsis Labs Invalid input(s): PROCALCITONIN,  WBC,  LACTICIDVEN Microbiology Recent Results (from the past 240 hour(s))  Blood culture (routine x 2)     Status: Abnormal   Collection Time: 04/02/19  9:35 PM   Specimen: BLOOD  Result Value Ref Range Status   Specimen Description BLOOD RIGHT ARM  Final   Special Requests   Final    BOTTLES DRAWN AEROBIC AND ANAEROBIC Blood Culture adequate volume   Culture  Setup Time   Final    GRAM POSITIVE COCCI IN CLUSTERS AEROBIC BOTTLE ONLY CRITICAL RESULT CALLED TO, READ BACK BY AND VERIFIED WITH: PHARMD JEREMY F. [id number]     Culture (A)  Final    MICROCOCCUS LUTEUS/LYLAE Standardized susceptibility testing for this organism is not available. Performed at [facility], [place] [address], [place], [place] [address]    Report Status 04/06/2019 FINAL  Final  Blood culture (routine x 2)     Status: None   Collection Time: 04/02/19  9:35 PM   Specimen: BLOOD  Result Value Ref Range Status   Specimen Description BLOOD RIGHT ARM  Final   Special Requests   Final    BOTTLES DRAWN AEROBIC AND ANAEROBIC Blood Culture results may not be optimal due to an inadequate volume of blood received in culture bottles   Culture   Final    NO GROWTH 5 DAYS Performed at [facility], [place] [address] [place], [place] [address]    Report Status 04/07/2019 FINAL  Final  Respiratory Panel by RT PCR (Flu A&B, Covid) - Nasopharyngeal Swab     Status: None   Collection Time: 04/02/19 11:36 PM   Specimen: Nasopharyngeal Swab  Result Value Ref Range Status   SARS Coronavirus 2 by RT PCR NEGATIVE NEGATIVE Final    Comment: (NOTE) SARS-CoV-2 target nucleic acids are NOT DETECTED. The SARS-CoV-2 RNA is generally detectable  in upper respiratoy specimens  during the acute phase of infection. The lowest concentration of SARS-CoV-2 viral copies this assay can detect is 131 copies/mL. A negative result does not preclude SARS-Cov-2 infection and should not be used as the sole basis for treatment or other patient management decisions. A negative result may occur with  improper specimen collection/handling, submission of specimen other than nasopharyngeal swab, presence of viral mutation(s) within the areas targeted by this assay, and inadequate number of viral copies (<131 copies/mL). A negative result must be combined with clinical observations, patient history, and epidemiological information. The expected result is Negative. Fact Sheet for Patients:  PinkCheek.be Fact Sheet for Healthcare Providers:  GravelBags.it This test is not yet ap proved or cleared by the [place] FDA and  has been authorized for detection and/or diagnosis of SARS-CoV-2 by FDA under an Emergency Use Authorization (EUA). This EUA will remain  in effect (meaning this test can be used) for the duration of the COVID-19 declaration under Section 564(b)(1) of the Act, 21 U.S.C. section 360bbb-3(b)(1), unless the authorization is terminated or revoked sooner.    Influenza A by PCR NEGATIVE NEGATIVE Final   Influenza B by PCR NEGATIVE NEGATIVE Final    Comment: (NOTE) The Xpert Xpress SARS-CoV-2/FLU/RSV assay is intended as an aid in  the diagnosis of influenza from Nasopharyngeal swab specimens and  should not be used as a sole basis for treatment. Nasal washings and  aspirates are unacceptable for Xpert Xpress SARS-CoV-2/FLU/RSV  testing. Fact Sheet for Patients: PinkCheek.be Fact Sheet for Healthcare Providers: GravelBags.it This test is not yet approved or cleared by the [place] FDA and  has been authorized for detection and/or diagnosis of  SARS-CoV-2 by  FDA under an Emergency Use Authorization (EUA). This EUA will remain  in effect (meaning this test can be used) for the duration of the  Covid-19 declaration under Section 564(b)(1) of the Act, 21  U.S.C. section 360bbb-3(b)(1), unless the authorization is  terminated or revoked. Performed at [facility], [place] [address] [place], [place] [address]   Urine culture     Status: Abnormal   Collection Time: 04/02/19 11:43 PM   Specimen: In/Out Cath Urine  Result Value Ref Range Status   Specimen Description IN/OUT CATH URINE  Final   Special Requests STERILE CONTAINER  Final   Culture (A)  Final    >=100,000 COLONIES/mL KLEBSIELLA PNEUMONIAE 10,000 COLONIES/mL PSEUDOMONAS AERUGINOSA Confirmed Extended Spectrum Beta-Lactamase Producer (ESBL).  In bloodstream infections from ESBL organisms, carbapenems are preferred over piperacillin/tazobactam. They are shown to have a lower risk of mortality. FOR KLEBSIELLA PNEUMONIAE Performed at [facility], [place] [address] [place], [place] [address]    Report Status 04/05/2019 FINAL  Final   Organism ID, Bacteria KLEBSIELLA PNEUMONIAE (A)  Final   Organism ID, Bacteria PSEUDOMONAS AERUGINOSA (A)  Final      Susceptibility   Klebsiella pneumoniae - MIC*    AMPICILLIN >=32 RESISTANT Resistant     CEFAZOLIN >=64 RESISTANT Resistant     CEFTRIAXONE >=64 RESISTANT Resistant     CIPROFLOXACIN 1 SENSITIVE Sensitive     GENTAMICIN >=16 RESISTANT Resistant     IMIPENEM 0.5 SENSITIVE Sensitive     NITROFURANTOIN 64 INTERMEDIATE Intermediate     TRIMETH/SULFA >=320 RESISTANT Resistant     AMPICILLIN/SULBACTAM >=32 RESISTANT Resistant     PIP/TAZO 16 SENSITIVE Sensitive     * >=100,000 COLONIES/mL KLEBSIELLA PNEUMONIAE   Pseudomonas aeruginosa - MIC*  CEFTAZIDIME 8 SENSITIVE Sensitive     CIPROFLOXACIN 0.5 SENSITIVE Sensitive     GENTAMICIN >=16 RESISTANT Resistant     IMIPENEM 2 SENSITIVE Sensitive     PIP/TAZO 16  SENSITIVE Sensitive     CEFEPIME 4 SENSITIVE Sensitive     * 10,000 COLONIES/mL PSEUDOMONAS AERUGINOSA  MRSA PCR Screening     Status: None   Collection Time: 04/03/19  6:01 AM   Specimen: Nasopharyngeal  Result Value Ref Range Status   MRSA by PCR NEGATIVE NEGATIVE Final    Comment:        The GeneXpert MRSA Assay (FDA approved for NASAL specimens only), is one component of a comprehensive MRSA colonization surveillance program. It is not intended to diagnose MRSA infection nor to guide or monitor treatment for MRSA infections. Performed at [facility], [place] [address] [place], [place] [address]   Culture, blood (Routine X 2) w Reflex to ID Panel     Status: None   Collection Time: 04/05/19 10:19 AM   Specimen: BLOOD RIGHT ARM  Result Value Ref Range Status   Specimen Description BLOOD RIGHT ARM  Final   Special Requests   Final    BOTTLES DRAWN AEROBIC ONLY Blood Culture adequate volume   Culture   Final    NO GROWTH 5 DAYS Performed at [facility], [address], [place], [place] [address]    Report Status 04/10/2019 FINAL  Final  Culture, blood (Routine X 2) w Reflex to ID Panel     Status: None   Collection Time: 04/05/19 10:25 AM   Specimen: BLOOD LEFT ARM  Result Value Ref Range Status   Specimen Description BLOOD LEFT ARM  Final   Special Requests   Final    BOTTLES DRAWN AEROBIC ONLY Blood Culture adequate volume   Culture   Final    NO GROWTH 5 DAYS Performed at [facility], [place] [address], [place], [place] [address]    Report Status 04/10/2019 FINAL  Final  SARS CORONAVIRUS 2 (TAT 6-24 HRS) Nasopharyngeal Nasopharyngeal Swab     Status: None   Collection Time: 04/08/19  8:38 PM   Specimen: Nasopharyngeal Swab  Result Value Ref Range Status   SARS Coronavirus 2 NEGATIVE NEGATIVE Final    Comment: (NOTE) SARS-CoV-2 target nucleic acids are NOT DETECTED. The SARS-CoV-2 RNA is generally detectable in upper and lower respiratory  specimens during the acute phase of infection. Negative results do not preclude SARS-CoV-2 infection, do not rule out co-infections with other pathogens, and should not be used as the sole basis for treatment or other patient management decisions. Negative results must be combined with clinical observations, patient history, and epidemiological information. The expected result is Negative. Fact Sheet for Patients: SugarRoll.be Fact Sheet for Healthcare Providers: https://www.woods-mathews.com/ This test is not yet approved or cleared by the [place] FDA and  has been authorized for detection and/or diagnosis of SARS-CoV-2 by FDA under an Emergency Use Authorization (EUA). This EUA will remain  in effect (meaning this test can be used) for the duration of the COVID-19 declaration under Section 56 4(b)(1) of the Act, 21 U.S.C. section 360bbb-3(b)(1), unless the authorization is terminated or revoked sooner. Performed at [facility], [place] [address], [place],  [address]   Blood culture (routine x 2)     Status: None (Preliminary result)   Collection Time: 04/08/19 11:10 PM   Specimen: BLOOD LEFT HAND  Result Value Ref Range Status  Specimen Description BLOOD LEFT HAND  Final   Special Requests   Final    BOTTLES DRAWN AEROBIC AND ANAEROBIC Blood Culture adequate volume   Culture   Final    NO GROWTH 2 DAYS Performed at [facility], [address], [place], [place] [address]    Report Status PENDING  Incomplete  Blood culture (routine x 2)     Status: None (Preliminary result)   Collection Time: 04/08/19 11:10 PM   Specimen: BLOOD RIGHT ARM  Result Value Ref Range Status   Specimen Description BLOOD RIGHT ARM  Final   Special Requests   Final    BOTTLES DRAWN AEROBIC AND ANAEROBIC Blood Culture adequate volume   Culture   Final    NO GROWTH 2 DAYS Performed at [facility], [place] [address], [place],  [place] [address]    Report Status PENDING  Incomplete     Time coordinating discharge: Over 30 minutes  SIGNED:   Darliss Cheney, MD  [facility] 04/10/2019, 12:33 PM  If 7PM-7AM, please contact night-coverage [email or web address]

## 2019-04-10 NOTE — Plan of Care (Signed)
  Problem: Education: Goal: Knowledge of General Education information will improve Description: Including pain rating scale, medication(s)/side effects and non-pharmacologic comfort measures Outcome: Progressing   Problem: Health Behavior/Discharge Planning: Goal: Ability to manage health-related needs will improve Outcome: Progressing   Problem: Clinical Measurements: Goal: Ability to maintain clinical measurements within normal limits will improve Outcome: Progressing Goal: Respiratory complications will improve Outcome: Progressing   Problem: Activity: Goal: Risk for activity intolerance will decrease Outcome: Progressing   Problem: Nutrition: Goal: Adequate nutrition will be maintained Outcome: Progressing   Problem: Elimination: Goal: Will not experience complications related to urinary retention Outcome: Progressing   Problem: Pain Managment: Goal: General experience of comfort will improve Outcome: Progressing   Problem: Safety: Goal: Ability to remain free from injury will improve Outcome: Progressing   

## 2019-04-10 NOTE — TOC Transition Note (Addendum)
Transition of Care [facility]) - CM/SW Discharge Note   Patient Details  Name: Donna [name] MRN: [id number] Date of Birth: September 10, 1950  [facility]) CM/SW Contact:  Claudie Leach, RN 04/10/2019, 12:47 PM   Clinical Narrative:    Patient to d/c home with previous [facility] services to continue IV abx infusions, HHPT and OT.  Patient switching antibiotics from Zosyn to Invanz due to allergy.    Pam with [facility] received orders and will have drug delivered for tomorrow's dose.  Patient will receive first dose of Invanz in the hospital today prior to d/c.   PTAR will be arranged for patient when ready for d/c.    Final next level of care: Rosa     Discharge Plan and Services      HH Arranged: RN, PT, OT [facility] Agency: [place] [facility] [employer] Date [facility] Agency Contacted: 04/10/19 Time [place]: 1247 Representative spoke with at [place]: Campbell Stall   Addendum 1500: DC canceled. Patient does not feel comfortable going home tonight as she feels rash is worse.  She would like to have new abx in her system overnight before discharging so she feels comfortable that drug does not need to be changed again.    Also, patient' copay for [facility] is over $400/week.  [facility] pharmacy is working with Dr. Johnnye Sima to find a less expensive alternative.

## 2019-04-10 NOTE — Progress Notes (Addendum)
INFECTIOUS DISEASE PROGRESS NOTE  ID: Donna SCHWIEBERT is a 69 y.o. female with  Principal Problem:   Allergic urticaria Active Problems:   Chronic atrial fibrillation [facility]): CHA2DS2-VASc Score 5; on Warfarin   Type 2 diabetes mellitus (HCC)   Decubitus ulcer of ischium, left, stage IV (HCC)   Sacral osteomyelitis (HCC)   Allergic reaction  Subjective: Not sure if her rash is better.  C/o r hip pain.   Abtx:  Anti-infectives (From admission, onward)   Start     Dose/Rate Route Frequency Ordered Stop   04/09/19 0000  meropenem (MERREM) 1 g in sodium chloride 0.9 % 100 mL IVPB     1 g 200 mL/hr over 30 Minutes Intravenous Every 8 hours 04/08/19 2355     04/08/19 2245  piperacillin-tazobactam (ZOSYN) IVPB 3.375 g  Status:  Discontinued     3.375 g 100 mL/hr over 30 Minutes Intravenous  Once 04/08/19 2235 04/08/19 2302      Medications:  Scheduled: . acetaminophen  1,000 mg Oral TID  . ALPRAZolam  0.25 mg Oral TID  . ARIPiprazole  10 mg Oral Daily  . busPIRone  10 mg Oral BID  . Chlorhexidine Gluconate Cloth  6 each Topical Daily  . Dulaglutide  1.5 mg Subcutaneous Q Sat  . ezetimibe  10 mg Oral QHS  . ferrous sulfate  325 mg Oral Q breakfast  . gabapentin  300 mg Oral TID  . insulin aspart  0-15 Units Subcutaneous TID WC  . insulin glargine  25 Units Subcutaneous QHS  . magnesium oxide  400 mg Oral TID  . metoprolol succinate  25 mg Oral Daily  . pramipexole  0.125 mg Oral QHS  . sertraline  100 mg Oral QHS  . spironolactone  12.5 mg Oral Daily  . warfarin  5 mg Oral ONCE-1800  . Warfarin - Pharmacist Dosing Inpatient   Does not apply q1800    Objective: Vital signs in last 24 hours: Temp:  [97.8 F (36.6 C)-98.7 F (37.1 C)] 97.8 F (36.6 C) (02/07 0517) Pulse Rate:  [78-92] 83 (02/07 0517) Resp:  [18-20] 18 (02/07 0517) BP: (109-116)/(64-82) 116/69 (02/07 0517) SpO2:  [93 %-95 %] 93 % (02/07 0517)   General appearance: alert, cooperative and no  distress Resp: clear to auscultation bilaterally Cardio: regular rate and rhythm GI: normal findings: bowel sounds normal and soft, non-tender Skin: macular - torso, groin   Lab Results Recent Labs    04/08/19 1909 04/08/19 1909 04/09/19 0509 04/10/19 0857  WBC 5.9   < > 5.6 5.4  HGB 10.6*   < > 9.7* 10.5*  HCT 37.2   < > 33.2* 37.4  NA 140  --  141  --   K 3.5  --  3.1*  --   CL 106  --  108  --   CO2 23  --  26  --   BUN 8  --  6*  --   CREATININE 0.85  --  0.80  --    < > = values in this interval not displayed.   Liver Panel Recent Labs    04/08/19 1909 04/09/19 0509  PROT 7.1 6.2*  ALBUMIN 3.2* 2.9*  AST 23 19  ALT 14 11  ALKPHOS 66 55  BILITOT 0.6 0.8   Sedimentation Rate No results for input(s): ESRSEDRATE in the last 72 hours. C-Reactive Protein No results for input(s): CRP in the last 72 hours.  Microbiology: Recent Results (from the  past 240 hour(s))  Blood culture (routine x 2)     Status: Abnormal   Collection Time: 04/02/19  9:35 PM   Specimen: BLOOD  Result Value Ref Range Status   Specimen Description BLOOD RIGHT ARM  Final   Special Requests   Final    BOTTLES DRAWN AEROBIC AND ANAEROBIC Blood Culture adequate volume   Culture  Setup Time   Final    GRAM POSITIVE COCCI IN CLUSTERS AEROBIC BOTTLE ONLY CRITICAL RESULT CALLED TO, READ BACK BY AND VERIFIED WITH: PHARMD JEREMY F. [id number]     Culture (A)  Final    MICROCOCCUS LUTEUS/LYLAE Standardized susceptibility testing for this organism is not available. Performed at [facility], [place] [address], [place], [place] [address]    Report Status 04/06/2019 FINAL  Final  Blood culture (routine x 2)     Status: None   Collection Time: 04/02/19  9:35 PM   Specimen: BLOOD  Result Value Ref Range Status   Specimen Description BLOOD RIGHT ARM  Final   Special Requests   Final    BOTTLES DRAWN AEROBIC AND ANAEROBIC Blood Culture results may not be optimal due to an inadequate volume of blood  received in culture bottles   Culture   Final    NO GROWTH 5 DAYS Performed at [facility], [place] [address] [place], [place] [address]    Report Status 04/07/2019 FINAL  Final  Respiratory Panel by RT PCR (Flu A&B, Covid) - Nasopharyngeal Swab     Status: None   Collection Time: 04/02/19 11:36 PM   Specimen: Nasopharyngeal Swab  Result Value Ref Range Status   SARS Coronavirus 2 by RT PCR NEGATIVE NEGATIVE Final    Comment: (NOTE) SARS-CoV-2 target nucleic acids are NOT DETECTED. The SARS-CoV-2 RNA is generally detectable in upper respiratoy specimens during the acute phase of infection. The lowest concentration of SARS-CoV-2 viral copies this assay can detect is 131 copies/mL. A negative result does not preclude SARS-Cov-2 infection and should not be used as the sole basis for treatment or other patient management decisions. A negative result may occur with  improper specimen collection/handling, submission of specimen other than nasopharyngeal swab, presence of viral mutation(s) within the areas targeted by this assay, and inadequate number of viral copies (<131 copies/mL). A negative result must be combined with clinical observations, patient history, and epidemiological information. The expected result is Negative. Fact Sheet for Patients:  PinkCheek.be Fact Sheet for Healthcare Providers:  GravelBags.it This test is not yet ap proved or cleared by the [place] FDA and  has been authorized for detection and/or diagnosis of SARS-CoV-2 by FDA under an Emergency Use Authorization (EUA). This EUA will remain  in effect (meaning this test can be used) for the duration of the COVID-19 declaration under Section 564(b)(1) of the Act, 21 U.S.C. section 360bbb-3(b)(1), unless the authorization is terminated or revoked sooner.    Influenza A by PCR NEGATIVE NEGATIVE Final   Influenza B by PCR NEGATIVE NEGATIVE Final     Comment: (NOTE) The Xpert Xpress SARS-CoV-2/FLU/RSV assay is intended as an aid in  the diagnosis of influenza from Nasopharyngeal swab specimens and  should not be used as a sole basis for treatment. Nasal washings and  aspirates are unacceptable for Xpert Xpress SARS-CoV-2/FLU/RSV  testing. Fact Sheet for Patients: PinkCheek.be Fact Sheet for Healthcare Providers: GravelBags.it This test is not yet approved or cleared by the [place] FDA and  has been authorized for detection  and/or diagnosis of SARS-CoV-2 by  FDA under an Emergency Use Authorization (EUA). This EUA will remain  in effect (meaning this test can be used) for the duration of the  Covid-19 declaration under Section 564(b)(1) of the Act, 21  U.S.C. section 360bbb-3(b)(1), unless the authorization is  terminated or revoked. Performed at [facility], [place] [address] [place], [place] [address]   Urine culture     Status: Abnormal   Collection Time: 04/02/19 11:43 PM   Specimen: In/Out Cath Urine  Result Value Ref Range Status   Specimen Description IN/OUT CATH URINE  Final   Special Requests STERILE CONTAINER  Final   Culture (A)  Final    >=100,000 COLONIES/mL KLEBSIELLA PNEUMONIAE 10,000 COLONIES/mL PSEUDOMONAS AERUGINOSA Confirmed Extended Spectrum Beta-Lactamase Producer (ESBL).  In bloodstream infections from ESBL organisms, carbapenems are preferred over piperacillin/tazobactam. They are shown to have a lower risk of mortality. FOR KLEBSIELLA PNEUMONIAE Performed at [facility], [place] [address], [place], [place] [address]    Report Status 04/05/2019 FINAL  Final   Organism ID, Bacteria KLEBSIELLA PNEUMONIAE (A)  Final   Organism ID, Bacteria PSEUDOMONAS AERUGINOSA (A)  Final      Susceptibility   Klebsiella pneumoniae - MIC*    AMPICILLIN >=32 RESISTANT Resistant     CEFAZOLIN >=64 RESISTANT Resistant     CEFTRIAXONE >=64 RESISTANT  Resistant     CIPROFLOXACIN 1 SENSITIVE Sensitive     GENTAMICIN >=16 RESISTANT Resistant     IMIPENEM 0.5 SENSITIVE Sensitive     NITROFURANTOIN 64 INTERMEDIATE Intermediate     TRIMETH/SULFA >=320 RESISTANT Resistant     AMPICILLIN/SULBACTAM >=32 RESISTANT Resistant     PIP/TAZO 16 SENSITIVE Sensitive     * >=100,000 COLONIES/mL KLEBSIELLA PNEUMONIAE   Pseudomonas aeruginosa - MIC*    CEFTAZIDIME 8 SENSITIVE Sensitive     CIPROFLOXACIN 0.5 SENSITIVE Sensitive     GENTAMICIN >=16 RESISTANT Resistant     IMIPENEM 2 SENSITIVE Sensitive     PIP/TAZO 16 SENSITIVE Sensitive     CEFEPIME 4 SENSITIVE Sensitive     * 10,000 COLONIES/mL PSEUDOMONAS AERUGINOSA  MRSA PCR Screening     Status: None   Collection Time: 04/03/19  6:01 AM   Specimen: Nasopharyngeal  Result Value Ref Range Status   MRSA by PCR NEGATIVE NEGATIVE Final    Comment:        The GeneXpert MRSA Assay (FDA approved for NASAL specimens only), is one component of a comprehensive MRSA colonization surveillance program. It is not intended to diagnose MRSA infection nor to guide or monitor treatment for MRSA infections. Performed at [facility], [place] [address], [place], [place] [address]   Culture, blood (Routine X 2) w Reflex to ID Panel     Status: None   Collection Time: 04/05/19 10:19 AM   Specimen: BLOOD RIGHT ARM  Result Value Ref Range Status   Specimen Description BLOOD RIGHT ARM  Final   Special Requests   Final    BOTTLES DRAWN AEROBIC ONLY Blood Culture adequate volume   Culture   Final    NO GROWTH 5 DAYS Performed at [facility], [address], [place],  [address]    Report Status 04/10/2019 FINAL  Final  Culture, blood (Routine X 2) w Reflex to ID Panel     Status: None   Collection Time: 04/05/19 10:25 AM   Specimen: BLOOD LEFT ARM  Result Value Ref Range Status   Specimen  Description BLOOD LEFT ARM  Final   Special Requests   Final    BOTTLES DRAWN AEROBIC ONLY Blood  Culture adequate volume   Culture   Final    NO GROWTH 5 DAYS Performed at [facility], [address], [place], [place] [address]    Report Status 04/10/2019 FINAL  Final  SARS CORONAVIRUS 2 (TAT 6-24 HRS) Nasopharyngeal Nasopharyngeal Swab     Status: None   Collection Time: 04/08/19  8:38 PM   Specimen: Nasopharyngeal Swab  Result Value Ref Range Status   SARS Coronavirus 2 NEGATIVE NEGATIVE Final    Comment: (NOTE) SARS-CoV-2 target nucleic acids are NOT DETECTED. The SARS-CoV-2 RNA is generally detectable in upper and lower respiratory specimens during the acute phase of infection. Negative results do not preclude SARS-CoV-2 infection, do not rule out co-infections with other pathogens, and should not be used as the sole basis for treatment or other patient management decisions. Negative results must be combined with clinical observations, patient history, and epidemiological information. The expected result is Negative. Fact Sheet for Patients: SugarRoll.be Fact Sheet for Healthcare Providers: https://www.woods-mathews.com/ This test is not yet approved or cleared by the [place] FDA and  has been authorized for detection and/or diagnosis of SARS-CoV-2 by FDA under an Emergency Use Authorization (EUA). This EUA will remain  in effect (meaning this test can be used) for the duration of the COVID-19 declaration under Section 56 4(b)(1) of the Act, 21 U.S.C. section 360bbb-3(b)(1), unless the authorization is terminated or revoked sooner. Performed at [facility], [place] [address], [place], [place] [address]   Blood culture (routine x 2)     Status: None (Preliminary result)   Collection Time: 04/08/19 11:10 PM   Specimen: BLOOD LEFT HAND  Result Value Ref Range Status   Specimen Description BLOOD LEFT HAND  Final   Special Requests   Final    BOTTLES DRAWN AEROBIC AND ANAEROBIC Blood Culture adequate volume    Culture   Final    NO GROWTH 2 DAYS Performed at [facility], [place] [address] [place], [place] [address]    Report Status PENDING  Incomplete  Blood culture (routine x 2)     Status: None (Preliminary result)   Collection Time: 04/08/19 11:10 PM   Specimen: BLOOD RIGHT ARM  Result Value Ref Range Status   Specimen Description BLOOD RIGHT ARM  Final   Special Requests   Final    BOTTLES DRAWN AEROBIC AND ANAEROBIC Blood Culture adequate volume   Culture   Final    NO GROWTH 2 DAYS Performed at [facility], [place] [address], [place], [place] [address]    Report Status PENDING  Incomplete    Studies/Results: No results found.    Assessment/Plan: Rash ESBL Klebsiella, Pseudomonas UTI  Sacral decubitus ulcer with osteomyelitis        Diabetes mellitus OSA A fib  Day 8 anbx (day 1 merrem)  Consider topical antifungal for possible candida under breasts and groin. Fluconazole will interact with her coumadin.  Her wound is clean. Please have WOC see.  (See images section) Her rash is minimally improved if at all.          Bobby Rumpf MD, FACP Infectious Diseases (pager) [phone] [email or web address] 04/10/2019, 10:00 AM  LOS: 1 day

## 2019-04-10 NOTE — Progress Notes (Signed)
Patient refuses CPAP 

## 2019-04-10 NOTE — Progress Notes (Signed)
PHARMACY CONSULT NOTE FOR:  OUTPATIENT  PARENTERAL ANTIBIOTIC THERAPY (OPAT)  Indication: Sacral Osteomyelitis Regimen: Ertapenem 1 g every 24 hours  End date: 05/13/19  IV antibiotic discharge orders are pended. To discharging provider:  please sign these orders via discharge navigator,  Select New Orders & click on the button choice - Manage This Unsigned Work.     Thank you for allowing pharmacy to be a part of this patient's care.   Brendolyn Patty, PharmD PGY2 Pharmacy Resident Phone [phone]  04/10/2019   11:18 AM

## 2019-04-10 NOTE — Progress Notes (Signed)
[place] for Coumadin Indication: atrial fibrillation  Allergies  Allergen Reactions  . Bee Venom Hives    Takes benadryl  . Other Other (See Comments)    Raw foods with seeds give her "boils".  Avoids raw strawberries, blueberries. Tolerates cooked fruits, tomato sauce, bread/grains with seeds. [facility] 10/17/13 Berries with seeds Allergy to glutamine-C-quercet-selen-brom per [facility] 09/25/17  . Zosyn [Piperacillin Sod-Tazobactam So] Rash    Itchy total body rash.  . Alteplase Rash  . Cefepime Rash  . Fentanyl Other (See Comments)    Hallucinations, syncope    . Metformin And Related Diarrhea  . Vancomycin Rash    Patient Measurements:    Vital Signs: Temp: 97.8 F (36.6 C) (02/07 0517) Temp Source: Oral (02/07 0517) BP: 116/69 (02/07 0517) Pulse Rate: 83 (02/07 0517)  Labs: Recent Labs    04/08/19 1909 04/09/19 0509 04/10/19 0334  HGB 10.6* 9.7*  --   HCT 37.2 33.2*  --   PLT 227 192  --   APTT 32  --   --   LABPROT 20.6* 22.2* 28.0*  INR 1.8* 2.0* 2.6*  CREATININE 0.85 0.80  --     Estimated Creatinine Clearance: 93.1 mL/min (by C-G formula based on SCr of 0.8 mg/dL).   Medical History: Past Medical History:  Diagnosis Date  . CHF (congestive heart failure) ([place])   . Chronic /permanent atrial fibrillation ([place]) 2005   On Warfarin (follwed @ [facility]); Rate controlled - On BB  . Chronic diastolic heart failure, NYHA class 2 ([place])    Updated by A. fib, hypertensive heart disease and obesity; EDP was only 7 by cardiac catheterization in September 2017  . Diabetic lumbosacral plexopathy ([place])   . Fibromyalgia   . Generalized anxiety disorder   . Hypokalemia 09/03/2017  . Incontinence   . Major depressive disorder   . OSA on CPAP   . Restless leg syndrome   . Syncope 09/03/2017  . Type II diabetes mellitus [facility])     Assessment: 69 yo female on chronic Coumadin for afib, admitted 2/4 with total  body rash after starting Zosyn outpatient for sacral osteomyelitis  Pharmacy asked to resume anticoagulation with Coumadin.  PTA warfarin dose a bit confusing. Seems she normally takes Coumadin 8 mg daily but this fluctuates based on INR readings. Was receiving 7.5mg  daily during last admission with discharge 2/4.  Today, INR is therapeutic at 2.6 but rapidly trending up from previous. Last CBC low but stable and no reported bleeding.   Goal of Therapy:  INR 2-3 Monitor platelets by anticoagulation protocol: Yes   Plan:  - Coumadin 5mg  PO x 1 - Daily PT/INR, CBC - Monitor for signs/symptoms of bleeding, antibiotic regimen changes for warfarin interaction   Brendolyn Patty, PharmD PGY2 Pharmacy Resident Phone [phone]  04/10/2019   9:31 AM

## 2019-04-10 NOTE — Progress Notes (Signed)
Patient refused dressing change on sacral pressure ulcer x 3, will attempt early morning.

## 2019-04-11 DIAGNOSIS — Z91018 Allergy to other foods: Secondary | ICD-10-CM

## 2019-04-11 DIAGNOSIS — Z888 Allergy status to other drugs, medicaments and biological substances status: Secondary | ICD-10-CM

## 2019-04-11 DIAGNOSIS — L500 Allergic urticaria: Principal | ICD-10-CM

## 2019-04-11 DIAGNOSIS — Z91030 Bee allergy status: Secondary | ICD-10-CM

## 2019-04-11 DIAGNOSIS — Z881 Allergy status to other antibiotic agents status: Secondary | ICD-10-CM

## 2019-04-11 DIAGNOSIS — Z885 Allergy status to narcotic agent status: Secondary | ICD-10-CM

## 2019-04-11 DIAGNOSIS — M869 Osteomyelitis, unspecified: Secondary | ICD-10-CM

## 2019-04-11 LAB — CBC
HCT: 36.8 % (ref 36.0–46.0)
Hemoglobin: 10.3 g/dL — ABNORMAL LOW (ref 12.0–15.0)
MCH: 22.7 pg — ABNORMAL LOW (ref 26.0–34.0)
MCHC: 28 g/dL — ABNORMAL LOW (ref 30.0–36.0)
MCV: 81.1 fL (ref 80.0–100.0)
Platelets: 244 10*3/uL (ref 150–400)
RBC: 4.54 MIL/uL (ref 3.87–5.11)
RDW: 19.4 % — ABNORMAL HIGH (ref 11.5–15.5)
WBC: 6.8 10*3/uL (ref 4.0–10.5)
nRBC: 0 % (ref 0.0–0.2)

## 2019-04-11 LAB — PROTIME-INR
INR: 2.9 — ABNORMAL HIGH (ref 0.8–1.2)
Prothrombin Time: 30.4 seconds — ABNORMAL HIGH (ref 11.4–15.2)

## 2019-04-11 LAB — GLUCOSE, CAPILLARY
Glucose-Capillary: 112 mg/dL — ABNORMAL HIGH (ref 70–99)
Glucose-Capillary: 120 mg/dL — ABNORMAL HIGH (ref 70–99)
Glucose-Capillary: 122 mg/dL — ABNORMAL HIGH (ref 70–99)
Glucose-Capillary: 194 mg/dL — ABNORMAL HIGH (ref 70–99)

## 2019-04-11 MED ORDER — CIPROFLOXACIN HCL 500 MG PO TABS
750.0000 mg | ORAL_TABLET | Freq: Two times a day (BID) | ORAL | Status: DC
  Administered 2019-04-11 – 2019-04-13 (×4): 750 mg via ORAL
  Filled 2019-04-11 (×4): qty 2

## 2019-04-11 MED ORDER — SODIUM CHLORIDE 0.9 % IV SOLN
1.0000 g | Freq: Three times a day (TID) | INTRAVENOUS | Status: DC
  Filled 2019-04-11 (×3): qty 1

## 2019-04-11 MED ORDER — MEROPENEM IV (FOR PTA / DISCHARGE USE ONLY): 1.0000 g | Freq: Three times a day (TID) | INTRAVENOUS | 0 refills | Status: DC

## 2019-04-11 MED ORDER — WARFARIN SODIUM 5 MG PO TABS
5.0000 mg | ORAL_TABLET | Freq: Once | ORAL | Status: AC
  Administered 2019-04-11: 5 mg via ORAL
  Filled 2019-04-11: qty 1

## 2019-04-11 NOTE — Progress Notes (Signed)
[facility]  Date of Admission:  04/08/2019     Total days of antibiotics 10         ASSESSMENT:  Donna [name] has worsening generalized rash that appears consistent with drug reaction. This is most likely to piperacillin-tazobactam, however cannot exclude ertapenem. She has no signs of anaphylaxis at present. She has culture negative sacral osteomyelitis and will change antibiotics to Ciprofloxacin which has good bioavailability, although does pose risk for C. Difficile colitis. Will also need to monitor warfarin levels while on ciprofloxacin and will coordinate this with her warfarin clinic at discharge.   PLAN:  1. Discontinue ertapenem.  2. Start ciprofloxacin 3. Monitor INR levels closely while on Ciprofloxacin.  4. Off loading of site and optimize nutrition.   Principal Problem:   Allergic urticaria Active Problems:   Chronic atrial fibrillation [facility]): CHA2DS2-VASc Score 5; on Warfarin   Type 2 diabetes mellitus (HCC)   Decubitus ulcer of ischium, left, stage IV (HCC)   Sacral osteomyelitis (HCC)   Allergic reaction   . acetaminophen  1,000 mg Oral TID  . ALPRAZolam  0.25 mg Oral TID  . ARIPiprazole  10 mg Oral Daily  . busPIRone  10 mg Oral BID  . Chlorhexidine Gluconate Cloth  6 each Topical Daily  . ciprofloxacin  750 mg Oral BID  . Dulaglutide  1.5 mg Subcutaneous Q Sat  . ezetimibe  10 mg Oral QHS  . ferrous sulfate  325 mg Oral Q breakfast  . gabapentin  300 mg Oral TID  . insulin aspart  0-15 Units Subcutaneous TID WC  . insulin glargine  25 Units Subcutaneous QHS  . magnesium oxide  400 mg Oral TID  . metoprolol succinate  25 mg Oral Daily  . nystatin   Topical TID  . pramipexole  0.125 mg Oral QHS  . sertraline  100 mg Oral QHS  . spironolactone  12.5 mg Oral Daily  . warfarin  5 mg Oral ONCE-1800  . Warfarin - Pharmacist Dosing Inpatient   Does not apply q1800    SUBJECTIVE:  Afebrile overnight with worsening of generalized  rash. Denies signs of anaphylaxis.   Allergies  Allergen Reactions  . Bee Venom Hives    Takes benadryl  . Other Other (See Comments)    Raw foods with seeds give her "boils".  Avoids raw strawberries, blueberries. Tolerates cooked fruits, tomato sauce, bread/grains with seeds. [facility] 10/17/13 Berries with seeds Allergy to glutamine-C-quercet-selen-brom per [facility] 09/25/17  . Zosyn [Piperacillin Sod-Tazobactam So] Rash    Itchy total body rash.  . Alteplase Rash  . Cefepime Rash  . Fentanyl Other (See Comments)    Hallucinations, syncope    . Metformin And Related Diarrhea  . Vancomycin Rash     Review of Systems: Review of Systems  Constitutional: Negative for chills, fever and weight loss.  Respiratory: Negative for cough, shortness of breath and wheezing.   Cardiovascular: Negative for chest pain and leg swelling.  Gastrointestinal: Negative for abdominal pain, constipation, diarrhea, nausea and vomiting.  Skin: Positive for itching and rash.      OBJECTIVE: Vitals:   04/10/19 1625 04/10/19 2033 04/11/19 0925 04/11/19 1603  BP: 109/70 119/69 122/73 121/63  Pulse: 71 83 77 80  Resp: 19 20  15   Temp: 97.9 F (36.6 C) 98.4 F (36.9 C)  (!) 97.5 F (36.4 C)  TempSrc: Oral   Oral  SpO2: 94% 93%  96%   There is no height  or weight on file to calculate BMI.  Physical Exam Constitutional:      General: She is not in acute distress.    Appearance: She is well-developed.  Cardiovascular:     Rate and Rhythm: Normal rate and regular rhythm.     Heart sounds: Normal heart sounds.  Pulmonary:     Effort: Pulmonary effort is normal.     Breath sounds: Normal breath sounds.  Skin:    General: Skin is warm and dry.     Findings: Rash (See Dr. Derek Mound attestation for additional pictures) present.  Neurological:     Mental Status: She is alert and oriented to person, place, and time.  Psychiatric:        Mood and Affect: Mood normal.      Lab Results Lab Results   Component Value Date   WBC 6.8 04/11/2019   HGB 10.3 (L) 04/11/2019   HCT 36.8 04/11/2019   MCV 81.1 04/11/2019   PLT 244 04/11/2019    Lab Results  Component Value Date   CREATININE 0.80 04/10/2019   BUN 6 (L) 04/10/2019   NA 143 04/10/2019   K 4.0 04/10/2019   CL 108 04/10/2019   CO2 27 04/10/2019    Lab Results  Component Value Date   ALT 11 04/09/2019   AST 19 04/09/2019   ALKPHOS 55 04/09/2019   BILITOT 0.8 04/09/2019     Microbiology: Recent Results (from the past 240 hour(s))  Blood culture (routine x 2)     Status: Abnormal   Collection Time: 04/02/19  9:35 PM   Specimen: BLOOD  Result Value Ref Range Status   Specimen Description BLOOD RIGHT ARM  Final   Special Requests   Final    BOTTLES DRAWN AEROBIC AND ANAEROBIC Blood Culture adequate volume   Culture  Setup Time   Final    GRAM POSITIVE COCCI IN CLUSTERS AEROBIC BOTTLE ONLY CRITICAL RESULT CALLED TO, READ BACK BY AND VERIFIED WITH: PHARMD JEREMY F. [id number]     Culture (A)  Final    MICROCOCCUS LUTEUS/LYLAE Standardized susceptibility testing for this organism is not available. Performed at [facility], [place] [address], [place], [place] [address]    Report Status 04/06/2019 FINAL  Final  Blood culture (routine x 2)     Status: None   Collection Time: 04/02/19  9:35 PM   Specimen: BLOOD  Result Value Ref Range Status   Specimen Description BLOOD RIGHT ARM  Final   Special Requests   Final    BOTTLES DRAWN AEROBIC AND ANAEROBIC Blood Culture results may not be optimal due to an inadequate volume of blood received in culture bottles   Culture   Final    NO GROWTH 5 DAYS Performed at [facility], [place] [address] [place],  [address]    Report Status 04/07/2019 FINAL  Final  Respiratory Panel by RT PCR (Flu A&B, Covid) - Nasopharyngeal Swab     Status: None   Collection Time: 04/02/19 11:36 PM   Specimen: Nasopharyngeal Swab  Result Value Ref Range Status   SARS Coronavirus 2 by  RT PCR NEGATIVE NEGATIVE Final    Comment: (NOTE) SARS-CoV-2 target nucleic acids are NOT DETECTED. The SARS-CoV-2 RNA is generally detectable in upper respiratoy specimens during the acute phase of infection. The lowest concentration of SARS-CoV-2 viral copies this assay can detect is 131 copies/mL. A negative result does not preclude SARS-Cov-2 infection and should not be used as the  sole basis for treatment or other patient management decisions. A negative result may occur with  improper specimen collection/handling, submission of specimen other than nasopharyngeal swab, presence of viral mutation(s) within the areas targeted by this assay, and inadequate number of viral copies (<131 copies/mL). A negative result must be combined with clinical observations, patient history, and epidemiological information. The expected result is Negative. Fact Sheet for Patients:  PinkCheek.be Fact Sheet for Healthcare Providers:  GravelBags.it This test is not yet ap proved or cleared by the [place] FDA and  has been authorized for detection and/or diagnosis of SARS-CoV-2 by FDA under an Emergency Use Authorization (EUA). This EUA will remain  in effect (meaning this test can be used) for the duration of the COVID-19 declaration under Section 564(b)(1) of the Act, 21 U.S.C. section 360bbb-3(b)(1), unless the authorization is terminated or revoked sooner.    Influenza A by PCR NEGATIVE NEGATIVE Final   Influenza B by PCR NEGATIVE NEGATIVE Final    Comment: (NOTE) The Xpert Xpress SARS-CoV-2/FLU/RSV assay is intended as an aid in  the diagnosis of influenza from Nasopharyngeal swab specimens and  should not be used as a sole basis for treatment. Nasal washings and  aspirates are unacceptable for Xpert Xpress SARS-CoV-2/FLU/RSV  testing. Fact Sheet for Patients: PinkCheek.be Fact Sheet for Healthcare  Providers: GravelBags.it This test is not yet approved or cleared by the [place] FDA and  has been authorized for detection and/or diagnosis of SARS-CoV-2 by  FDA under an Emergency Use Authorization (EUA). This EUA will remain  in effect (meaning this test can be used) for the duration of the  Covid-19 declaration under Section 564(b)(1) of the Act, 21  U.S.C. section 360bbb-3(b)(1), unless the authorization is  terminated or revoked. Performed at [facility], [place] [address], [place], [place] [address]   Urine culture     Status: Abnormal   Collection Time: 04/02/19 11:43 PM   Specimen: In/Out Cath Urine  Result Value Ref Range Status   Specimen Description IN/OUT CATH URINE  Final   Special Requests STERILE CONTAINER  Final   Culture (A)  Final    >=100,000 COLONIES/mL KLEBSIELLA PNEUMONIAE 10,000 COLONIES/mL PSEUDOMONAS AERUGINOSA Confirmed Extended Spectrum Beta-Lactamase Producer (ESBL).  In bloodstream infections from ESBL organisms, carbapenems are preferred over piperacillin/tazobactam. They are shown to have a lower risk of mortality. FOR KLEBSIELLA PNEUMONIAE Performed at [facility], [place] [address] [place], [place] [address]    Report Status 04/05/2019 FINAL  Final   Organism ID, Bacteria KLEBSIELLA PNEUMONIAE (A)  Final   Organism ID, Bacteria PSEUDOMONAS AERUGINOSA (A)  Final      Susceptibility   Klebsiella pneumoniae - MIC*    AMPICILLIN >=32 RESISTANT Resistant     CEFAZOLIN >=64 RESISTANT Resistant     CEFTRIAXONE >=64 RESISTANT Resistant     CIPROFLOXACIN 1 SENSITIVE Sensitive     GENTAMICIN >=16 RESISTANT Resistant     IMIPENEM 0.5 SENSITIVE Sensitive     NITROFURANTOIN 64 INTERMEDIATE Intermediate     TRIMETH/SULFA >=320 RESISTANT Resistant     AMPICILLIN/SULBACTAM >=32 RESISTANT Resistant     PIP/TAZO 16 SENSITIVE Sensitive     * >=100,000 COLONIES/mL KLEBSIELLA PNEUMONIAE   Pseudomonas aeruginosa - MIC*     CEFTAZIDIME 8 SENSITIVE Sensitive     CIPROFLOXACIN 0.5 SENSITIVE Sensitive     GENTAMICIN >=16 RESISTANT Resistant     IMIPENEM 2 SENSITIVE Sensitive     PIP/TAZO 16 SENSITIVE Sensitive  CEFEPIME 4 SENSITIVE Sensitive     * 10,000 COLONIES/mL PSEUDOMONAS AERUGINOSA  MRSA PCR Screening     Status: None   Collection Time: 04/03/19  6:01 AM   Specimen: Nasopharyngeal  Result Value Ref Range Status   MRSA by PCR NEGATIVE NEGATIVE Final    Comment:        The GeneXpert MRSA Assay (FDA approved for NASAL specimens only), is one component of a comprehensive MRSA colonization surveillance program. It is not intended to diagnose MRSA infection nor to guide or monitor treatment for MRSA infections. Performed at [facility], [place] [address], [place], [place] [address]   Culture, blood (Routine X 2) w Reflex to ID Panel     Status: None   Collection Time: 04/05/19 10:19 AM   Specimen: BLOOD RIGHT ARM  Result Value Ref Range Status   Specimen Description BLOOD RIGHT ARM  Final   Special Requests   Final    BOTTLES DRAWN AEROBIC ONLY Blood Culture adequate volume   Culture   Final    NO GROWTH 5 DAYS Performed at [facility], [address] [place], [place] [address]    Report Status 04/10/2019 FINAL  Final  Culture, blood (Routine X 2) w Reflex to ID Panel     Status: None   Collection Time: 04/05/19 10:25 AM   Specimen: BLOOD LEFT ARM  Result Value Ref Range Status   Specimen Description BLOOD LEFT ARM  Final   Special Requests   Final    BOTTLES DRAWN AEROBIC ONLY Blood Culture adequate volume   Culture   Final    NO GROWTH 5 DAYS Performed at [facility], [place] [address] [place], [place] [address]    Report Status 04/10/2019 FINAL  Final  SARS CORONAVIRUS 2 (TAT 6-24 HRS) Nasopharyngeal Nasopharyngeal Swab     Status: None   Collection Time: 04/08/19  8:38 PM   Specimen: Nasopharyngeal Swab  Result Value Ref Range Status   SARS Coronavirus 2  NEGATIVE NEGATIVE Final    Comment: (NOTE) SARS-CoV-2 target nucleic acids are NOT DETECTED. The SARS-CoV-2 RNA is generally detectable in upper and lower respiratory specimens during the acute phase of infection. Negative results do not preclude SARS-CoV-2 infection, do not rule out co-infections with other pathogens, and should not be used as the sole basis for treatment or other patient management decisions. Negative results must be combined with clinical observations, patient history, and epidemiological information. The expected result is Negative. Fact Sheet for Patients: SugarRoll.be Fact Sheet for Healthcare Providers: https://www.woods-mathews.com/ This test is not yet approved or cleared by the [place] FDA and  has been authorized for detection and/or diagnosis of SARS-CoV-2 by FDA under an Emergency Use Authorization (EUA). This EUA will remain  in effect (meaning this test can be used) for the duration of the COVID-19 declaration under Section 56 4(b)(1) of the Act, 21 U.S.C. section 360bbb-3(b)(1), unless the authorization is terminated or revoked sooner. Performed at [facility], [place] [address], [place], [place] [address]   Blood culture (routine x 2)     Status: None (Preliminary result)   Collection Time: 04/08/19 11:10 PM   Specimen: BLOOD LEFT HAND  Result Value Ref Range Status   Specimen Description BLOOD LEFT HAND  Final   Special Requests   Final    BOTTLES DRAWN AEROBIC AND ANAEROBIC Blood Culture adequate volume   Culture   Final    NO GROWTH 2 DAYS  Performed at [facility], [place] [address] [place], [place] [address]    Report Status PENDING  Incomplete  Blood culture (routine x 2)     Status: None (Preliminary result)   Collection Time: 04/08/19 11:10 PM   Specimen: BLOOD RIGHT ARM  Result Value Ref Range Status   Specimen Description BLOOD RIGHT ARM  Final   Special Requests   Final     BOTTLES DRAWN AEROBIC AND ANAEROBIC Blood Culture adequate volume   Culture   Final    NO GROWTH 2 DAYS Performed at [facility], [place] [address], [place],  [address]    Report Status PENDING  Incomplete     Terri Piedra, [place] [facility] [phone] Pager  04/11/2019  4:25 PM

## 2019-04-11 NOTE — Progress Notes (Signed)
PHARMACY CONSULT NOTE FOR:  OUTPATIENT  PARENTERAL ANTIBIOTIC THERAPY (OPAT)  Indication: Sacral Osteomyelitis Regimen:Meropenem 1 gm q 8 hours  End date: 05/13/19  IV antibiotic discharge orders are pended. To discharging provider:  please sign these orders via discharge navigator,  Select New Orders & click on the button choice - Manage This Unsigned Work.    Spoke with Dr. Tommy Medal this morning- Meropenem is more cost effective for the patient and covers all previously isolated organisms. We will switch Ms. Maiden's OPAT to meropenem.   Thank you for allowing pharmacy to be a part of this patient's care.   Jimmy Footman, PharmD, BCPS, BCIDP Infectious Diseases Clinical Pharmacist Phone: [phone]  04/11/2019   11:04 AM

## 2019-04-11 NOTE — Progress Notes (Signed)
PROGRESS NOTE    Donna [name]  [id number] DOB: [birth year] DOA: 04/08/2019 PCP: Janine Limbo, PA-C   Brief Narrative:  HPI: Donna [name] is a 69 y.o. female with medical history significant of DM2, OSA on CPAP, a.fib on coumadin, chronic sacral decubitus ulcer.  Patient was admitted on 1/30 to ICU in septic shock.  Source was either urine (which grew out 100k CFU of ESBL klebsellia and 10k CFU of pseudomonas), and/or sacral osteomyelitis.  Initiated on broad spectrum ABx with zosyn / vanc at that time.  Pt improved, ID consulted, recd just continuing zosyn for 6 weeks.  Discharged home on 2/4.  Morning of 2/5 at 3am takes zosyn at home for first time (though she had been on it at hospital during whole admit).  Goes to bed, and wakes up AM of 3/5 with itchy rash over entire body.  Presents to ED.   ED Course: Given benadryl in ED.  Pt confirms zosyn is the ONLY new medication she is on after hospital stay.  All other meds she has been on for several months at least.  Assessment & Plan:   Principal Problem:   Allergic urticaria Active Problems:   Chronic atrial fibrillation [facility]): CHA2DS2-VASc Score 5; on Warfarin   Type 2 diabetes mellitus (HCC)   Decubitus ulcer of ischium, left, stage IV (HCC)   Sacral osteomyelitis (HCC)   Allergic reaction  Drug related allergic urticaria : This was opined to be secondary to Zosyn by ID.  She was switched to [facility] and her rash improved yesterday so ID recommended discharging on ertapenem.  She was in fact discharged however patient was afraid to go home and wanted to stay overnight to see if she is going to tolerate a ertapenem.  She received a dose yesterday.  Today, patient has worsening of her rash on the abdomen and the posterior trunk as well as involving bilateral upper half of the upper extremities.  She also complains of itching.  I have notified Dr. Tommy Medal from ID who has switched her back to [facility].  We will continue as needed  Benadryl and keep her in the hospital to see if she tolerates Merrem.  Sacral osteomyelitis and ulcer: Wound care and ID on board.  Continue Merrem.  Permanent A. Fib: Rate controlled.  Continue metoprolol and Coumadin.  INR therapeutic.  Type 2 diabetes mellitus: Blood sugar fairly controlled.  Continue current dose of Lantus and SSI.  DVT prophylaxis: Coumadin Code Status: Full code Family Communication:  None present at bedside.  Plan of care discussed with patient in length and he verbalized understanding and agreed with it. Patient is from: Home Disposition Plan: Home once improved Barriers to discharge: Clinical improvement   Estimated body mass index is 34.65 kg/m as calculated from the following:   Height as of 04/02/19: 5\' 11"  (1.803 m).   Weight as of 04/06/19: 112.7 kg.  Pressure Injury 04/02/19 Sacrum Mid Stage 3 -  Full thickness tissue loss. Subcutaneous fat may be visible but bone, tendon or muscle are NOT exposed. (Active)  04/02/19 2355  Location: Sacrum  Location Orientation: Mid  Staging: Stage 3 -  Full thickness tissue loss. Subcutaneous fat may be visible but bone, tendon or muscle are NOT exposed.  Wound Description (Comments):   Present on Admission: Yes     Nutritional status:               Consultants:   ID  Procedures:   None  Antimicrobials:   Merrem   Subjective: Seen and examined.  Complains of itching all over the body and worsening of rash.  No other complaint.  Objective: Vitals:   04/10/19 0517 04/10/19 1625 04/10/19 2033 04/11/19 0925  BP: 116/69 109/70 119/69 122/73  Pulse: 83 71 83 77  Resp: 18 19 20    Temp: 97.8 F (36.6 C) 97.9 F (36.6 C) 98.4 F (36.9 C)   TempSrc: Oral Oral    SpO2: 93% 94% 93%     Intake/Output Summary (Last 24 hours) at 04/11/2019 1150 Last data filed at 04/11/2019 0500 Gross per 24 hour  Intake 880 ml  Output 850 ml  Net 30 ml   There were no vitals filed for this  visit.  Examination:  General exam: Appears calm and comfortable  Respiratory system: Clear to auscultation. Respiratory effort normal. Cardiovascular system: S1 & S2 heard, RRR. No JVD, murmurs, rubs, gallops or clicks. No pedal edema. Gastrointestinal system: Abdomen is nondistended, soft and nontender. No organomegaly or masses felt. Normal bowel sounds heard. Central nervous system: Alert and oriented. No focal neurological deficits. Extremities: Symmetric 5 x 5 power. Skin: Macular papular rash involving posterior and anterior trunk as well as upper half of the upper extremities bilaterally Psychiatry: Judgement and insight appear normal. Mood & affect appropriate.    Data Reviewed: I have personally reviewed following labs and imaging studies  CBC: Recent Labs  Lab 04/08/19 1909 04/09/19 0509 04/10/19 0857 04/11/19 0217  WBC 5.9 5.6 5.4 6.8  NEUTROABS 4.0  --   --   --   HGB 10.6* 9.7* 10.5* 10.3*  HCT 37.2 33.2* 37.4 36.8  MCV 80.7 78.7* 81.5 81.1  PLT 227 192 207 [id number]   Basic Metabolic Panel: Recent Labs  Lab 04/05/19 0240 04/06/19 0053 04/07/19 0434 04/08/19 1909 04/09/19 0509 04/10/19 0857  NA 136 136  --  140 141 143  K 3.4* 3.7  --  3.5 3.1* 4.0  CL 100 100  --  106 108 108  CO2 26 26  --  23 26 27   GLUCOSE 149* 162*  --  142* 120* 145*  BUN 11 11  --  8 6* 6*  CREATININE 0.82 0.80  --  0.85 0.80 0.80  CALCIUM 8.8* 8.8*  --  9.3 8.9 9.2  MG 1.5* 1.7 1.6*  --   --   --    GFR: Estimated Creatinine Clearance: 93.1 mL/min (by C-G formula based on SCr of 0.8 mg/dL). Liver Function Tests: Recent Labs  Lab 04/08/19 1909 04/09/19 0509  AST 23 19  ALT 14 11  ALKPHOS 66 55  BILITOT 0.6 0.8  PROT 7.1 6.2*  ALBUMIN 3.2* 2.9*   No results for input(s): LIPASE, AMYLASE in the last 168 hours. No results for input(s): AMMONIA in the last 168 hours. Coagulation Profile: Recent Labs  Lab 04/07/19 0434 04/08/19 1909 04/09/19 0509 04/10/19 0334  04/11/19 0217  INR 1.7* 1.8* 2.0* 2.6* 2.9*   Cardiac Enzymes: No results for input(s): CKTOTAL, CKMB, CKMBINDEX, TROPONINI in the last 168 hours. BNP (last 3 results) No results for input(s): PROBNP in the last 8760 hours. HbA1C: No results for input(s): HGBA1C in the last 72 hours. CBG: Recent Labs  Lab 04/10/19 0758 04/10/19 1201 04/10/19 1711 04/10/19 2031 04/11/19 0801  GLUCAP 141* 209* 109* 161* 112*   Lipid Profile: No results for input(s): CHOL, HDL, LDLCALC, TRIG, CHOLHDL, LDLDIRECT in the last 72 hours. Thyroid Function Tests: No results for input(s): TSH,  T4TOTAL, FREET4, T3FREE, THYROIDAB in the last 72 hours. Anemia Panel: No results for input(s): VITAMINB12, FOLATE, FERRITIN, TIBC, IRON, RETICCTPCT in the last 72 hours. Sepsis Labs: No results for input(s): PROCALCITON, LATICACIDVEN in the last 168 hours.  Recent Results (from the past 240 hour(s))  Blood culture (routine x 2)     Status: Abnormal   Collection Time: 04/02/19  9:35 PM   Specimen: BLOOD  Result Value Ref Range Status   Specimen Description BLOOD RIGHT ARM  Final   Special Requests   Final    BOTTLES DRAWN AEROBIC AND ANAEROBIC Blood Culture adequate volume   Culture  Setup Time   Final    GRAM POSITIVE COCCI IN CLUSTERS AEROBIC BOTTLE ONLY CRITICAL RESULT CALLED TO, READ BACK BY AND VERIFIED WITH: PHARMD JEREMY F. [id number]     Culture (A)  Final    MICROCOCCUS LUTEUS/LYLAE Standardized susceptibility testing for this organism is not available. Performed at [facility], [place] [address], [place], [place] [address]    Report Status 04/06/2019 FINAL  Final  Blood culture (routine x 2)     Status: None   Collection Time: 04/02/19  9:35 PM   Specimen: BLOOD  Result Value Ref Range Status   Specimen Description BLOOD RIGHT ARM  Final   Special Requests   Final    BOTTLES DRAWN AEROBIC AND ANAEROBIC Blood Culture results may not be optimal due to an inadequate volume of blood received in  culture bottles   Culture   Final    NO GROWTH 5 DAYS Performed at [facility], [place] [address], [place], [place] [address]    Report Status 04/07/2019 FINAL  Final  Respiratory Panel by RT PCR (Flu A&B, Covid) - Nasopharyngeal Swab     Status: None   Collection Time: 04/02/19 11:36 PM   Specimen: Nasopharyngeal Swab  Result Value Ref Range Status   SARS Coronavirus 2 by RT PCR NEGATIVE NEGATIVE Final    Comment: (NOTE) SARS-CoV-2 target nucleic acids are NOT DETECTED. The SARS-CoV-2 RNA is generally detectable in upper respiratoy specimens during the acute phase of infection. The lowest concentration of SARS-CoV-2 viral copies this assay can detect is 131 copies/mL. A negative result does not preclude SARS-Cov-2 infection and should not be used as the sole basis for treatment or other patient management decisions. A negative result may occur with  improper specimen collection/handling, submission of specimen other than nasopharyngeal swab, presence of viral mutation(s) within the areas targeted by this assay, and inadequate number of viral copies (<131 copies/mL). A negative result must be combined with clinical observations, patient history, and epidemiological information. The expected result is Negative. Fact Sheet for Patients:  PinkCheek.be Fact Sheet for Healthcare Providers:  GravelBags.it This test is not yet ap proved or cleared by the [place] FDA and  has been authorized for detection and/or diagnosis of SARS-CoV-2 by FDA under an Emergency Use Authorization (EUA). This EUA will remain  in effect (meaning this test can be used) for the duration of the COVID-19 declaration under Section 564(b)(1) of the Act, 21 U.S.C. section 360bbb-3(b)(1), unless the authorization is terminated or revoked sooner.    Influenza A by PCR NEGATIVE NEGATIVE Final   Influenza B by PCR NEGATIVE NEGATIVE Final    Comment:  (NOTE) The Xpert Xpress SARS-CoV-2/FLU/RSV assay is intended as an aid in  the diagnosis of influenza from Nasopharyngeal swab specimens and  should not be used as a sole basis for treatment.  Nasal washings and  aspirates are unacceptable for Xpert Xpress SARS-CoV-2/FLU/RSV  testing. Fact Sheet for Patients: PinkCheek.be Fact Sheet for Healthcare Providers: GravelBags.it This test is not yet approved or cleared by the [place] FDA and  has been authorized for detection and/or diagnosis of SARS-CoV-2 by  FDA under an Emergency Use Authorization (EUA). This EUA will remain  in effect (meaning this test can be used) for the duration of the  Covid-19 declaration under Section 564(b)(1) of the Act, 21  U.S.C. section 360bbb-3(b)(1), unless the authorization is  terminated or revoked. Performed at [facility], [place] [address], [place], [place] [address]   Urine culture     Status: Abnormal   Collection Time: 04/02/19 11:43 PM   Specimen: In/Out Cath Urine  Result Value Ref Range Status   Specimen Description IN/OUT CATH URINE  Final   Special Requests STERILE CONTAINER  Final   Culture (A)  Final    >=100,000 COLONIES/mL KLEBSIELLA PNEUMONIAE 10,000 COLONIES/mL PSEUDOMONAS AERUGINOSA Confirmed Extended Spectrum Beta-Lactamase Producer (ESBL).  In bloodstream infections from ESBL organisms, carbapenems are preferred over piperacillin/tazobactam. They are shown to have a lower risk of mortality. FOR KLEBSIELLA PNEUMONIAE Performed at [facility], [place] [address], [place], [place] [address]    Report Status 04/05/2019 FINAL  Final   Organism ID, Bacteria KLEBSIELLA PNEUMONIAE (A)  Final   Organism ID, Bacteria PSEUDOMONAS AERUGINOSA (A)  Final      Susceptibility   Klebsiella pneumoniae - MIC*    AMPICILLIN >=32 RESISTANT Resistant     CEFAZOLIN >=64 RESISTANT Resistant     CEFTRIAXONE >=64 RESISTANT Resistant      CIPROFLOXACIN 1 SENSITIVE Sensitive     GENTAMICIN >=16 RESISTANT Resistant     IMIPENEM 0.5 SENSITIVE Sensitive     NITROFURANTOIN 64 INTERMEDIATE Intermediate     TRIMETH/SULFA >=320 RESISTANT Resistant     AMPICILLIN/SULBACTAM >=32 RESISTANT Resistant     PIP/TAZO 16 SENSITIVE Sensitive     * >=100,000 COLONIES/mL KLEBSIELLA PNEUMONIAE   Pseudomonas aeruginosa - MIC*    CEFTAZIDIME 8 SENSITIVE Sensitive     CIPROFLOXACIN 0.5 SENSITIVE Sensitive     GENTAMICIN >=16 RESISTANT Resistant     IMIPENEM 2 SENSITIVE Sensitive     PIP/TAZO 16 SENSITIVE Sensitive     CEFEPIME 4 SENSITIVE Sensitive     * 10,000 COLONIES/mL PSEUDOMONAS AERUGINOSA  MRSA PCR Screening     Status: None   Collection Time: 04/03/19  6:01 AM   Specimen: Nasopharyngeal  Result Value Ref Range Status   MRSA by PCR NEGATIVE NEGATIVE Final    Comment:        The GeneXpert MRSA Assay (FDA approved for NASAL specimens only), is one component of a comprehensive MRSA colonization surveillance program. It is not intended to diagnose MRSA infection nor to guide or monitor treatment for MRSA infections. Performed at [facility], [place] [address] [place], [place] [address]   Culture, blood (Routine X 2) w Reflex to ID Panel     Status: None   Collection Time: 04/05/19 10:19 AM   Specimen: BLOOD RIGHT ARM  Result Value Ref Range Status   Specimen Description BLOOD RIGHT ARM  Final   Special Requests   Final    BOTTLES DRAWN AEROBIC ONLY Blood Culture adequate volume   Culture   Final    NO GROWTH 5 DAYS Performed at [facility], [address] [place], [place] [address]    Report  Status 04/10/2019 FINAL  Final  Culture, blood (Routine X 2) w Reflex to ID Panel     Status: None   Collection Time: 04/05/19 10:25 AM   Specimen: BLOOD LEFT ARM  Result Value Ref Range Status   Specimen Description BLOOD LEFT ARM  Final   Special Requests   Final    BOTTLES DRAWN AEROBIC ONLY Blood Culture adequate  volume   Culture   Final    NO GROWTH 5 DAYS Performed at [facility], [address], [place], [place] [address]    Report Status 04/10/2019 FINAL  Final  SARS CORONAVIRUS 2 (TAT 6-24 HRS) Nasopharyngeal Nasopharyngeal Swab     Status: None   Collection Time: 04/08/19  8:38 PM   Specimen: Nasopharyngeal Swab  Result Value Ref Range Status   SARS Coronavirus 2 NEGATIVE NEGATIVE Final    Comment: (NOTE) SARS-CoV-2 target nucleic acids are NOT DETECTED. The SARS-CoV-2 RNA is generally detectable in upper and lower respiratory specimens during the acute phase of infection. Negative results do not preclude SARS-CoV-2 infection, do not rule out co-infections with other pathogens, and should not be used as the sole basis for treatment or other patient management decisions. Negative results must be combined with clinical observations, patient history, and epidemiological information. The expected result is Negative. Fact Sheet for Patients: SugarRoll.be Fact Sheet for Healthcare Providers: https://www.woods-mathews.com/ This test is not yet approved or cleared by the [place] FDA and  has been authorized for detection and/or diagnosis of SARS-CoV-2 by FDA under an Emergency Use Authorization (EUA). This EUA will remain  in effect (meaning this test can be used) for the duration of the COVID-19 declaration under Section 56 4(b)(1) of the Act, 21 U.S.C. section 360bbb-3(b)(1), unless the authorization is terminated or revoked sooner. Performed at [facility], [place] [address], [place], [place] [address]   Blood culture (routine x 2)     Status: None (Preliminary result)   Collection Time: 04/08/19 11:10 PM   Specimen: BLOOD LEFT HAND  Result Value Ref Range Status   Specimen Description BLOOD LEFT HAND  Final   Special Requests   Final    BOTTLES DRAWN AEROBIC AND ANAEROBIC Blood Culture adequate volume   Culture   Final    NO  GROWTH 2 DAYS Performed at [facility], [place] [address], [place], [place] [address]    Report Status PENDING  Incomplete  Blood culture (routine x 2)     Status: None (Preliminary result)   Collection Time: 04/08/19 11:10 PM   Specimen: BLOOD RIGHT ARM  Result Value Ref Range Status   Specimen Description BLOOD RIGHT ARM  Final   Special Requests   Final    BOTTLES DRAWN AEROBIC AND ANAEROBIC Blood Culture adequate volume   Culture   Final    NO GROWTH 2 DAYS Performed at [facility], [place] [address] [place], [place] [address]    Report Status PENDING  Incomplete      Radiology Studies: No results found.  Scheduled Meds: . acetaminophen  1,000 mg Oral TID  . ALPRAZolam  0.25 mg Oral TID  . ARIPiprazole  10 mg Oral Daily  . busPIRone  10 mg Oral BID  . Chlorhexidine Gluconate Cloth  6 each Topical Daily  . Dulaglutide  1.5 mg Subcutaneous Q Sat  . ezetimibe  10 mg Oral QHS  . ferrous sulfate  325 mg Oral Q breakfast  . gabapentin  300 mg  Oral TID  . insulin aspart  0-15 Units Subcutaneous TID WC  . insulin glargine  25 Units Subcutaneous QHS  . magnesium oxide  400 mg Oral TID  . metoprolol succinate  25 mg Oral Daily  . nystatin   Topical TID  . pramipexole  0.125 mg Oral QHS  . sertraline  100 mg Oral QHS  . spironolactone  12.5 mg Oral Daily  . warfarin  5 mg Oral ONCE-1800  . Warfarin - Pharmacist Dosing Inpatient   Does not apply q1800   Continuous Infusions: . meropenem (MERREM) IV       LOS: 2 days   Time spent: 32 minutes   Darliss Cheney, MD [facility]  04/11/2019, 11:50 AM   To contact the attending provider between 7A-7P or the covering provider during after hours 7P-7A, please log into the web site [email or web address].

## 2019-04-11 NOTE — Progress Notes (Signed)
ID Pharmacy Note   Unfortunately, Donna [name] is still having a rash while on meropenem/ertapenem. We will plan on switching her to oral ciprofloxacin. Noted that patient is on warfarin. Ciprofloxacin can cause an increase in warfarin levels and increased risk for bleeding.  Donna [name] will require close follow up and monitoring of her warfarin dose.    Jimmy Footman, PharmD, BCPS, BCIDP Infectious Diseases Clinical Pharmacist Phone: [phone] 04/11/2019 4:14 PM

## 2019-04-11 NOTE — TOC Initial Note (Signed)
Transition of Care [facility]) - Initial/Assessment Note    Patient Details  Name: Donna [name] MRN: [id number] Date of Birth: 08-27-1950  Transition of Care [facility]) CM/SW Contact:    Marilu Favre, RN Phone Number: 04/11/2019, 10:48 AM  Clinical Narrative:                 Patient from home with husband. Confirmed face sheet.   Has walker, wheelchair and lift at home already. Will need PTAR home.   Plan to discharge on IV Merrem, husband unable to come to hospital today for teaching. Plan to give am dose here at hospital tomorrow morning then DC home and [place] RN give 2 pm dose and teach husband.   Expected Discharge Plan: [place]     Patient Goals and CMS Choice Patient states their goals for this hospitalization and ongoing recovery are:: to return to home CMS Medicare.gov Compare Post Acute Care list provided to:: Patient Choice offered to / list presented to : Patient  Expected Discharge Plan and Services Expected Discharge Plan: [place]   Discharge Planning Services: CM Consult Post Acute Care Choice: [place] arrangements for the past 2 months: Single Family Home Expected Discharge Date: 04/10/19               DME Arranged: N/A         HH Arranged: PT, OT, RN [address] Agency: [place] ([place]) Date [place]: 04/11/19 Time [place]: [place] Representative spoke with at [place]: [place], left message.  Prior Living Arrangements/Services Living arrangements for the past 2 months: Single Family Home Lives with:: Spouse Patient language and need for interpreter reviewed:: Yes Do you feel safe going back to the place where you live?: Yes      Need for Family Participation in Patient Care: Yes (Comment) Care giver support system in place?: Yes (comment) Current home services: DME, Home OT, Home PT, Home RN Criminal Activity/Legal Involvement Pertinent to Current  Situation/Hospitalization: No - Comment as needed  Activities of Daily Living      Permission Sought/Granted   Permission granted to share information with : Yes, Verbal Permission Granted  Share Information with NAME: husband Joesph           Emotional Assessment Appearance:: Appears stated age Attitude/Demeanor/Rapport: Engaged Affect (typically observed): Accepting Orientation: : Oriented to Self, Oriented to Place, Oriented to  Time, Oriented to Situation Alcohol / Substance Use: Not Applicable Psych Involvement: No (comment)  Admission diagnosis:  Allergic reaction [T78.40XA] Patient Active Problem List   Diagnosis Date Noted  . Allergic reaction 04/09/2019  . Sacral osteomyelitis ([place]) 04/08/2019  . Allergic urticaria 04/08/2019  . Abscess of back 04/04/2019  . Sepsis ([place]) 04/03/2019  . Pressure injury of skin 04/03/2019  . Orthostatic hypotension   . Leukocytosis 11/13/2018  . Hypomagnesemia 02/02/2018  . Preop cardiovascular exam 02/02/2018  . Decubitus ulcer of ischium, left, stage IV ([place]) 09/28/2017  . Atrial fibrillation with RVR ([place]) 09/25/2017  . Cellulitis of sacral region 09/25/2017  . Normocytic anemia 09/25/2017  . Type 2 diabetes mellitus ([place]) 09/25/2017  . Chronic pain 09/25/2017  . Chronic atrial fibrillation with RVR   . History of seizures 09/03/2017  . Hypokalemia 09/03/2017  . Depression 02/09/2017  . Anxiety 02/09/2017  . Venous stasis of both lower extremities 07/25/2016  . Essential hypertension 07/25/2016  . Anticoagulant long-term use 07/15/2016  . 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 Chronic diastolic CHF (congestive heart failure) ([place]) 02/08/2016  . Chronic atrial fibrillation ([place]): CHA2DS2-VASc Score 5; on Warfarin 02/08/2016  . Morbid obesity ([place]) 02/08/2016  . OSA (obstructive sleep apnea) 02/08/2016  . Pre-operative cardiovascular examination 02/08/2016  . Medication management  02/08/2016  . Fibromyalgia 08/07/2012  . Restless legs syndrome 08/07/2012   PCP:  Janine Limbo, PA-C Pharmacy:   [facility] [place], [place] [address] [place] [address] Phone: [phone] Fax: [phone]     Social Determinants of Health (SDOH) Interventions    Readmission Risk Interventions No flowsheet data found.

## 2019-04-11 NOTE — Progress Notes (Signed)
[place] for Coumadin Indication: atrial fibrillation  Allergies  Allergen Reactions  . Bee Venom Hives    Takes benadryl  . Other Other (See Comments)    Raw foods with seeds give her "boils".  Avoids raw strawberries, blueberries. Tolerates cooked fruits, tomato sauce, bread/grains with seeds. [facility] 10/17/13 Berries with seeds Allergy to glutamine-C-quercet-selen-brom per [facility] 09/25/17  . Zosyn [Piperacillin Sod-Tazobactam So] Rash    Itchy total body rash.  . Alteplase Rash  . Cefepime Rash  . Fentanyl Other (See Comments)    Hallucinations, syncope    . Metformin And Related Diarrhea  . Vancomycin Rash    Patient Measurements:    Vital Signs: Temp: 98.4 F (36.9 C) (02/07 2033) BP: 119/69 (02/07 2033) Pulse Rate: 83 (02/07 2033)  Labs: Recent Labs    04/08/19 1909 04/08/19 1909 04/09/19 0509 04/09/19 0509 04/10/19 0334 04/10/19 0857 04/11/19 0217  HGB 10.6*   < > 9.7*   < >  --  10.5* 10.3*  HCT 37.2   < > 33.2*  --   --  37.4 36.8  PLT 227   < > 192  --   --  207 244  APTT 32  --   --   --   --   --   --   LABPROT 20.6*   < > 22.2*  --  28.0*  --  30.4*  INR 1.8*   < > 2.0*  --  2.6*  --  2.9*  CREATININE 0.85  --  0.80  --   --  0.80  --    < > = values in this interval not displayed.    Estimated Creatinine Clearance: 93.1 mL/min (by C-G formula based on SCr of 0.8 mg/dL).   Medical History: Past Medical History:  Diagnosis Date  . CHF (congestive heart failure) ([place])   . Chronic /permanent atrial fibrillation ([place]) 2005   On Warfarin (follwed @ [facility]); Rate controlled - On BB  . Chronic diastolic heart failure, NYHA class 2 ([place])    Updated by A. fib, hypertensive heart disease and obesity; EDP was only 7 by cardiac catheterization in September 2017  . Diabetic lumbosacral plexopathy ([place])   . Fibromyalgia   . Generalized anxiety disorder   . Hypokalemia 09/03/2017  . Incontinence    . Major depressive disorder   . OSA on CPAP   . Restless leg syndrome   . Syncope 09/03/2017  . Type II diabetes mellitus [facility])     Assessment: 69 yo female on chronic Coumadin for afib, admitted 2/4 with total body rash after starting Zosyn outpatient for sacral osteomyelitis  Pharmacy asked to resume anticoagulation with Coumadin.  PTA warfarin dose a bit confusing. Seems she normally takes Coumadin 8 mg daily but this fluctuates based on INR readings. Was receiving 7.5mg  daily during last admission with discharge 2/4.  Today, INR is therapeutic at 2.9 but rapidly trending up from previous. Last CBC low but stable and no reported bleeding.  Patient on abx (currently ertapenem) which may affect warfarin therapy by elevating the INR.    Goal of Therapy:  INR 2-3 Monitor platelets by anticoagulation protocol: Yes   Plan:  - Coumadin 5mg  PO x 1 - Daily PT/INR, CBC - Monitor for signs/symptoms of bleeding, antibiotic regimen changes for warfarin interaction   Donna [name] A. Levada Dy, PharmD, BCPS, FNKF Clinical Pharmacist [place] Please utilize Amion for appropriate phone number to reach the unit  pharmacist ([place])    04/11/2019   8:27 AM

## 2019-04-11 NOTE — Consult Note (Addendum)
[facility] Nurse Consult Note: Patient receiving care in [place]. Reason for Consult: "sacral osteomyelitis" Wound type: stage 4 sacral/coccyx wound Pressure Injury POA: Yes, per patient developed when she was receiving care in a SNF Measurement: 4.2 cm x 1.8 cm x 1.1 cm.  There is 2.3 cm of undermining at 7 o'clock. Wound bed: 100% pink Drainage (amount, consistency, odor) none Periwound: erythematous from "rash" Dressing procedure/placement/frequency: Care provided and ordered is for:  Place a piece of Xeroform gauze Kellie Simmering ([phone]) into the sacral/coccyx wound, be sure to tuck it into all undermined areas. Cover with a foam dressing. Change daily. Patient educated on importance of turning right or left, and avoiding pressure to the area. Monitor the wound area(s) for worsening of condition such as: Signs/symptoms of infection,  Increase in size,  Development of or worsening of odor, Development of pain, or increased pain at the affected locations.  Notify the medical team if any of these develop.  Thank you for the consult.  Discussed plan of care with the patient.  [place] nurse will not follow at this time.  Please re-consult the [place] team if needed.  Val Riles, RN, MSN, CWOCN, CNS-BC, pager [phone]

## 2019-04-12 DIAGNOSIS — R21 Rash and other nonspecific skin eruption: Secondary | ICD-10-CM

## 2019-04-12 LAB — BASIC METABOLIC PANEL
Anion gap: 7 (ref 5–15)
BUN: 12 mg/dL (ref 8–23)
CO2: 28 mmol/L (ref 22–32)
Calcium: 9.3 mg/dL (ref 8.9–10.3)
Chloride: 105 mmol/L (ref 98–111)
Creatinine, Ser: 0.81 mg/dL (ref 0.44–1.00)
GFR calc Af Amer: >60 mL/min (ref >60)
GFR calc non Af Amer: >60 mL/min (ref >60)
Glucose, Bld: 113 mg/dL — ABNORMAL HIGH (ref 70–99)
Potassium: 3.9 mmol/L (ref 3.5–5.1)
Sodium: 140 mmol/L (ref 135–145)

## 2019-04-12 LAB — CBC
HCT: 36 % (ref 36.0–46.0)
Hemoglobin: 10.2 g/dL — ABNORMAL LOW (ref 12.0–15.0)
MCH: 23.1 pg — ABNORMAL LOW (ref 26.0–34.0)
MCHC: 28.3 g/dL — ABNORMAL LOW (ref 30.0–36.0)
MCV: 81.6 fL (ref 80.0–100.0)
Platelets: 249 10*3/uL (ref 150–400)
RBC: 4.41 MIL/uL (ref 3.87–5.11)
RDW: 19.5 % — ABNORMAL HIGH (ref 11.5–15.5)
WBC: 8 10*3/uL (ref 4.0–10.5)
nRBC: 0 % (ref 0.0–0.2)

## 2019-04-12 LAB — GLUCOSE, CAPILLARY
Glucose-Capillary: 114 mg/dL — ABNORMAL HIGH (ref 70–99)
Glucose-Capillary: 193 mg/dL — ABNORMAL HIGH (ref 70–99)
Glucose-Capillary: 104 mg/dL — ABNORMAL HIGH (ref 70–99)
Glucose-Capillary: 156 mg/dL — ABNORMAL HIGH (ref 70–99)

## 2019-04-12 LAB — PROTIME-INR
INR: 2.7 — ABNORMAL HIGH (ref 0.8–1.2)
Prothrombin Time: 28.3 seconds — ABNORMAL HIGH (ref 11.4–15.2)

## 2019-04-12 MED ORDER — LORATADINE 10 MG PO TABS
10.0000 mg | ORAL_TABLET | Freq: Every day | ORAL | Status: DC
  Administered 2019-04-12 – 2019-04-13 (×2): 10 mg via ORAL
  Filled 2019-04-12 (×2): qty 1

## 2019-04-12 MED ORDER — WARFARIN SODIUM 5 MG PO TABS
5.0000 mg | ORAL_TABLET | Freq: Once | ORAL | Status: AC
  Administered 2019-04-12: 5 mg via ORAL
  Filled 2019-04-12: qty 1

## 2019-04-12 NOTE — Progress Notes (Signed)
Blood sugar at 1700hr -104

## 2019-04-12 NOTE — Progress Notes (Signed)
ANTICOAGULATION CONSULT NOTE - Follow Up Consult  Pharmacy Consult for Coumadin Indication: atrial fibrillation  Allergies  Allergen Reactions  . Bee Venom Hives    Takes benadryl  . Other Other (See Comments)    Raw foods with seeds give her "boils".  Avoids raw strawberries, blueberries. Tolerates cooked fruits, tomato sauce, bread/grains with seeds. [facility] 10/17/13 Berries with seeds Allergy to glutamine-C-quercet-selen-brom per [facility] 09/25/17  . Zosyn [Piperacillin Sod-Tazobactam So] Rash    Itchy total body rash.  . Alteplase Rash  . Cefepime Rash  . Fentanyl Other (See Comments)    Hallucinations, syncope    . Metformin And Related Diarrhea  . Vancomycin Rash    Patient Measurements:  Vital Signs: Temp: 98.3 F (36.8 C) (02/09 0525) Temp Source: Oral (02/09 0525) BP: 111/64 (02/09 0525) Pulse Rate: 82 (02/09 0525)  Labs: Recent Labs    04/10/19 0334 04/10/19 0857 04/10/19 0857 04/11/19 0217 04/12/19 0209  HGB  --  10.5*   < > 10.3* 10.2*  HCT  --  37.4  --  36.8 36.0  PLT  --  207  --  244 249  LABPROT 28.0*  --   --  30.4* 28.3*  INR 2.6*  --   --  2.9* 2.7*  CREATININE  --  0.80  --   --  0.81   < > = values in this interval not displayed.    Estimated Creatinine Clearance: 91.9 mL/min (by C-G formula based on SCr of 0.81 mg/dL).   Assessment: AC: [place] PTA for afib. PTA Coumadin 6-8mg /day variable per med rec. Admission INR 1.8. CHA2DS2-VASc Score 5. INR 2.7. Hgb 10.2 stable.   Goal of Therapy:  INR 2-3 Monitor platelets by anticoagulation protocol: Yes   Plan:  Coumadin 5mg  po x 1 tonight. Daily INR.  Cipro 750mg  BID Need to recheck Magnesium (last low 2/4)  **Note: - Cipro can contribute to significant mental health side effects. Also permanent peripheral neuropathy. Patient with psychiatric h/o GAD, MDD, RLS,    Donna [name] S. Alford Highland, PharmD, BCPS Clinical Staff Pharmacist [email or web address] Alford Highland, Naika Noto Stillinger 04/12/2019,8:29 AM

## 2019-04-12 NOTE — Progress Notes (Signed)
[facility] for Infectious Disease  Date of Admission:  04/08/2019     Total days of antibiotics 11         ASSESSMENT:  Ms. Hornbeck's generalized rash is mostly unchanged in <24 hours since change of beta-lactam to ciprofloxacin. Would anticipate improvement over the next 24-48 hours as concentration of previous antibiotics wean. No signs of anaphylaxis. Continue symptom management per primary team. Will continue current dose of ciprofloxacin.  PLAN:  1. Continue ciprofloxacin. 2. Symptom management per primary team.   Principal Problem:   Allergic urticaria Active Problems:   Chronic atrial fibrillation ([place]): CHA2DS2-VASc Score 5; on Warfarin   Morbid obesity (H[facility])   Essential hypertension   Depression with anxiety   Type 2 diabetes mellitus ([place])   Decubitus ulcer of ischium, left, stage IV (H[facility])   Sacral osteomyelitis ([place])   . acetaminophen  1,000 mg Oral TID  . ALPRAZolam  0.25 mg Oral TID  . ARIPiprazole  10 mg Oral Daily  . busPIRone  10 mg Oral BID  . Chlorhexidine Gluconate Cloth  6 each Topical Daily  . ciprofloxacin  750 mg Oral BID  . Dulaglutide  1.5 mg Subcutaneous Q Sat  . ezetimibe  10 mg Oral QHS  . ferrous sulfate  325 mg Oral Q breakfast  . gabapentin  300 mg Oral TID  . insulin aspart  0-15 Units Subcutaneous TID WC  . insulin glargine  25 Units Subcutaneous QHS  . magnesium oxide  400 mg Oral TID  . metoprolol succinate  25 mg Oral Daily  . nystatin   Topical TID  . pramipexole  0.125 mg Oral QHS  . sertraline  100 mg Oral QHS  . spironolactone  12.5 mg Oral Daily  . warfarin  5 mg Oral ONCE-1800  . Warfarin - Pharmacist Dosing Inpatient   Does not apply q1800    SUBJECTIVE:  Afebrile overnight with no acute events. Rash is about the same. May have had some improvement.   Allergies  Allergen Reactions  . Bee Venom Hives    Takes benadryl  . Other Other (See Comments)    Raw foods with seeds give her "boils".  Avoids raw  strawberries, blueberries. Tolerates cooked fruits, tomato sauce, bread/grains with seeds. [facility] 10/17/13 Berries with seeds Allergy to glutamine-C-quercet-selen-brom per [facility] 09/25/17  . Zosyn [Piperacillin Sod-Tazobactam So] Rash    Itchy total body rash.  . Alteplase Rash  . Cefepime Rash  . Fentanyl Other (See Comments)    Hallucinations, syncope    . Metformin And Related Diarrhea  . Vancomycin Rash     Review of Systems: Review of Systems  Constitutional: Negative for chills, fever and weight loss.  Respiratory: Negative for cough, shortness of breath and wheezing.   Cardiovascular: Negative for chest pain and leg swelling.  Gastrointestinal: Negative for abdominal pain, constipation, diarrhea, nausea and vomiting.  Skin: Positive for rash.      OBJECTIVE: Vitals:   04/11/19 0925 04/11/19 1603 04/11/19 2020 04/12/19 0525  BP: 122/73 121/63 128/62 111/64  Pulse: 77 80 90 82  Resp:  15 16 18   Temp:  (!) 97.5 F (36.4 C) 97.6 F (36.4 C) 98.3 F (36.8 C)  TempSrc:  Oral Oral Oral  SpO2:  96% 95% 94%   There is no height or weight on file to calculate BMI.  Physical Exam Constitutional:      General: She is not in acute distress.    Appearance: She is well-developed.  Cardiovascular:     Rate and Rhythm: Normal rate and regular rhythm.     Heart sounds: Normal heart sounds.  Pulmonary:     Effort: Pulmonary effort is normal.     Breath sounds: Normal breath sounds.  Skin:    General: Skin is warm and dry.  Neurological:     Mental Status: She is alert and oriented to person, place, and time.  Psychiatric:        Behavior: Behavior normal.        Thought Content: Thought content normal.        Judgment: Judgment normal.     Lab Results Lab Results  Component Value Date   WBC 8.0 04/12/2019   HGB 10.2 (L) 04/12/2019   HCT 36.0 04/12/2019   MCV 81.6 04/12/2019   PLT 249 04/12/2019    Lab Results  Component Value Date   CREATININE 0.81 04/12/2019    BUN 12 04/12/2019   NA 140 04/12/2019   K 3.9 04/12/2019   CL 105 04/12/2019   CO2 28 04/12/2019    Lab Results  Component Value Date   ALT 11 04/09/2019   AST 19 04/09/2019   ALKPHOS 55 04/09/2019   BILITOT 0.8 04/09/2019     Microbiology: Recent Results (from the past 240 hour(s))  Blood culture (routine x 2)     Status: Abnormal   Collection Time: 04/02/19  9:35 PM   Specimen: BLOOD  Result Value Ref Range Status   Specimen Description BLOOD RIGHT ARM  Final   Special Requests   Final    BOTTLES DRAWN AEROBIC AND ANAEROBIC Blood Culture adequate volume   Culture  Setup Time   Final    GRAM POSITIVE COCCI IN CLUSTERS AEROBIC BOTTLE ONLY CRITICAL RESULT CALLED TO, READ BACK BY AND VERIFIED WITH: PHARMD JEREMY F. [id number]     Culture (A)  Final    MICROCOCCUS LUTEUS/LYLAE Standardized susceptibility testing for this organism is not available. Performed at [facility], [place] [address], [place], [place] [address]    Report Status 04/06/2019 FINAL  Final  Blood culture (routine x 2)     Status: None   Collection Time: 04/02/19  9:35 PM   Specimen: BLOOD  Result Value Ref Range Status   Specimen Description BLOOD RIGHT ARM  Final   Special Requests   Final    BOTTLES DRAWN AEROBIC AND ANAEROBIC Blood Culture results may not be optimal due to an inadequate volume of blood received in culture bottles   Culture   Final    NO GROWTH 5 DAYS Performed at [facility], [place] [address] [place], [place] [address]    Report Status 04/07/2019 FINAL  Final  Respiratory Panel by RT PCR (Flu A&B, Covid) - Nasopharyngeal Swab     Status: None   Collection Time: 04/02/19 11:36 PM   Specimen: Nasopharyngeal Swab  Result Value Ref Range Status   SARS Coronavirus 2 by RT PCR NEGATIVE NEGATIVE Final    Comment: (NOTE) SARS-CoV-2 target nucleic acids are NOT DETECTED. The SARS-CoV-2 RNA is generally detectable in upper respiratoy specimens during the acute phase of infection.  The lowest concentration of SARS-CoV-2 viral copies this assay can detect is 131 copies/mL. A negative result does not preclude SARS-Cov-2 infection and should not be used as the sole basis for treatment or other patient management decisions. A negative result may occur with  improper specimen collection/handling, submission of specimen other than nasopharyngeal swab, presence of  viral mutation(s) within the areas targeted by this assay, and inadequate number of viral copies (<131 copies/mL). A negative result must be combined with clinical observations, patient history, and epidemiological information. The expected result is Negative. Fact Sheet for Patients:  PinkCheek.be Fact Sheet for Healthcare Providers:  GravelBags.it This test is not yet ap proved or cleared by the [place] FDA and  has been authorized for detection and/or diagnosis of SARS-CoV-2 by FDA under an Emergency Use Authorization (EUA). This EUA will remain  in effect (meaning this test can be used) for the duration of the COVID-19 declaration under Section 564(b)(1) of the Act, 21 U.S.C. section 360bbb-3(b)(1), unless the authorization is terminated or revoked sooner.    Influenza A by PCR NEGATIVE NEGATIVE Final   Influenza B by PCR NEGATIVE NEGATIVE Final    Comment: (NOTE) The Xpert Xpress SARS-CoV-2/FLU/RSV assay is intended as an aid in  the diagnosis of influenza from Nasopharyngeal swab specimens and  should not be used as a sole basis for treatment. Nasal washings and  aspirates are unacceptable for Xpert Xpress SARS-CoV-2/FLU/RSV  testing. Fact Sheet for Patients: PinkCheek.be Fact Sheet for Healthcare Providers: GravelBags.it This test is not yet approved or cleared by the [place] FDA and  has been authorized for detection and/or diagnosis of SARS-CoV-2 by  FDA under an  Emergency Use Authorization (EUA). This EUA will remain  in effect (meaning this test can be used) for the duration of the  Covid-19 declaration under Section 564(b)(1) of the Act, 21  U.S.C. section 360bbb-3(b)(1), unless the authorization is  terminated or revoked. Performed at [facility], [place] [address] [place], [place] [address]   Urine culture     Status: Abnormal   Collection Time: 04/02/19 11:43 PM   Specimen: In/Out Cath Urine  Result Value Ref Range Status   Specimen Description IN/OUT CATH URINE  Final   Special Requests STERILE CONTAINER  Final   Culture (A)  Final    >=100,000 COLONIES/mL KLEBSIELLA PNEUMONIAE 10,000 COLONIES/mL PSEUDOMONAS AERUGINOSA Confirmed Extended Spectrum Beta-Lactamase Producer (ESBL).  In bloodstream infections from ESBL organisms, carbapenems are preferred over piperacillin/tazobactam. They are shown to have a lower risk of mortality. FOR KLEBSIELLA PNEUMONIAE Performed at [facility], [place] [address], [place], [place] [address]    Report Status 04/05/2019 FINAL  Final   Organism ID, Bacteria KLEBSIELLA PNEUMONIAE (A)  Final   Organism ID, Bacteria PSEUDOMONAS AERUGINOSA (A)  Final      Susceptibility   Klebsiella pneumoniae - MIC*    AMPICILLIN >=32 RESISTANT Resistant     CEFAZOLIN >=64 RESISTANT Resistant     CEFTRIAXONE >=64 RESISTANT Resistant     CIPROFLOXACIN 1 SENSITIVE Sensitive     GENTAMICIN >=16 RESISTANT Resistant     IMIPENEM 0.5 SENSITIVE Sensitive     NITROFURANTOIN 64 INTERMEDIATE Intermediate     TRIMETH/SULFA >=320 RESISTANT Resistant     AMPICILLIN/SULBACTAM >=32 RESISTANT Resistant     PIP/TAZO 16 SENSITIVE Sensitive     * >=100,000 COLONIES/mL KLEBSIELLA PNEUMONIAE   Pseudomonas aeruginosa - MIC*    CEFTAZIDIME 8 SENSITIVE Sensitive     CIPROFLOXACIN 0.5 SENSITIVE Sensitive     GENTAMICIN >=16 RESISTANT Resistant     IMIPENEM 2 SENSITIVE Sensitive     PIP/TAZO 16 SENSITIVE Sensitive     CEFEPIME 4  SENSITIVE Sensitive     * 10,000 COLONIES/mL PSEUDOMONAS AERUGINOSA  MRSA PCR Screening     Status: None   Collection Time:  04/03/19  6:01 AM   Specimen: Nasopharyngeal  Result Value Ref Range Status   MRSA by PCR NEGATIVE NEGATIVE Final    Comment:        The GeneXpert MRSA Assay (FDA approved for NASAL specimens only), is one component of a comprehensive MRSA colonization surveillance program. It is not intended to diagnose MRSA infection nor to guide or monitor treatment for MRSA infections. Performed at [facility], [place] [address] [place], [place] [address]   Culture, blood (Routine X 2) w Reflex to ID Panel     Status: None   Collection Time: 04/05/19 10:19 AM   Specimen: BLOOD RIGHT ARM  Result Value Ref Range Status   Specimen Description BLOOD RIGHT ARM  Final   Special Requests   Final    BOTTLES DRAWN AEROBIC ONLY Blood Culture adequate volume   Culture   Final    NO GROWTH 5 DAYS Performed at [facility], [address], [place], [place] [address]    Report Status 04/10/2019 FINAL  Final  Culture, blood (Routine X 2) w Reflex to ID Panel     Status: None   Collection Time: 04/05/19 10:25 AM   Specimen: BLOOD LEFT ARM  Result Value Ref Range Status   Specimen Description BLOOD LEFT ARM  Final   Special Requests   Final    BOTTLES DRAWN AEROBIC ONLY Blood Culture adequate volume   Culture   Final    NO GROWTH 5 DAYS Performed at [facility], [place] [address] [place], [place] [address]    Report Status 04/10/2019 FINAL  Final  SARS CORONAVIRUS 2 (TAT 6-24 HRS) Nasopharyngeal Nasopharyngeal Swab     Status: None   Collection Time: 04/08/19  8:38 PM   Specimen: Nasopharyngeal Swab  Result Value Ref Range Status   SARS Coronavirus 2 NEGATIVE NEGATIVE Final    Comment: (NOTE) SARS-CoV-2 target nucleic acids are NOT DETECTED. The SARS-CoV-2 RNA is generally detectable in upper and lower respiratory specimens during the acute phase of  infection. Negative results do not preclude SARS-CoV-2 infection, do not rule out co-infections with other pathogens, and should not be used as the sole basis for treatment or other patient management decisions. Negative results must be combined with clinical observations, patient history, and epidemiological information. The expected result is Negative. Fact Sheet for Patients: SugarRoll.be Fact Sheet for Healthcare Providers: https://www.woods-mathews.com/ This test is not yet approved or cleared by the [place] FDA and  has been authorized for detection and/or diagnosis of SARS-CoV-2 by FDA under an Emergency Use Authorization (EUA). This EUA will remain  in effect (meaning this test can be used) for the duration of the COVID-19 declaration under Section 56 4(b)(1) of the Act, 21 U.S.C. section 360bbb-3(b)(1), unless the authorization is terminated or revoked sooner. Performed at [facility], [place] [address], [place], [place] [address]   Blood culture (routine x 2)     Status: None (Preliminary result)   Collection Time: 04/08/19 11:10 PM   Specimen: BLOOD LEFT HAND  Result Value Ref Range Status   Specimen Description BLOOD LEFT HAND  Final   Special Requests   Final    BOTTLES DRAWN AEROBIC AND ANAEROBIC Blood Culture adequate volume   Culture   Final    NO GROWTH 4 DAYS Performed at [facility], [place] [address], [place], [place] [address]    Report Status PENDING  Incomplete  Blood culture (routine x 2)  Status: None (Preliminary result)   Collection Time: 04/08/19 11:10 PM   Specimen: BLOOD RIGHT ARM  Result Value Ref Range Status   Specimen Description BLOOD RIGHT ARM  Final   Special Requests   Final    BOTTLES DRAWN AEROBIC AND ANAEROBIC Blood Culture adequate volume   Culture   Final    NO GROWTH 4 DAYS Performed at [facility], [address] [place], [place] [address]    Report Status PENDING   Incomplete     Terri Piedra, [place] for [facility] [phone] Pager  04/12/2019  1:24 PM

## 2019-04-12 NOTE — Progress Notes (Addendum)
PROGRESS NOTE    Donna [name]  [id number] DOB: Jul 07, 1950 DOA: 04/08/2019 PCP: Janine Limbo, PA-C     Brief Narrative:  Patient is a 69 year old female with a medical history significant for type 2 diabetes, OSA on CPAP, atrial fibrillation requiring warfarin, and a chronic sacral decubitus ulcer.  She was admitted on 1/30 to the ICU for septic shock 2/2 UTI vs osteomyelitis.  Pt improved, ID consulted, recd just continuing zosyn for 6 weeks. Discharged home on 2/4.  Morning of 2/5 at 3am takes zosyn at home (though she had been on it at hospital during admission). Goes to bed, and wakes up AM of 3/5 with itchy rash over entire body. Presents to ED.   New events last 24 hours / Subjective: Change from ertapenem to oral Cipro.  No change in the rash, still having some itching.  Denies any pain other than behind her legs.  No swelling in her mouth or throat, no shortness of breath.  Denies any fevers.  Assessment & Plan:   Principal Problem:   Allergic urticaria  Appreciate ID recommendations  Benadryl 25 mg every 8 hours as needed for itching  Changed abx to Cipro.   Active Problems:   Chronic atrial fibrillation [facility]): CHA2DS2-VASc Score 5; on Warfarin  Appreciate pharmacy input on home warfarin dosing with addition of ciprofloxacin    Type 2 diabetes mellitus (HCC)  SSI  Continue Trulicity every Saturday  Zetia 10 mg daily  Gabapentin 300 mg 3 times daily     Decubitus ulcer of ischium, left, stage IV ([place])  Care as outlined below    Sacral osteomyelitis (HCC)  Currently on ciprofloxacin 750 mg bid  Oxycodone 5 mg every 6 hours as needed    CHF  Spironolactone 12.5 mg daily    Depression and anxiety  Continue Abilify 10 mg daily  Continue BuSpar 10 mg twice daily  Continue Xanax 0.25 mg 3 times daily   Zoloft 100 mg daily  In agreement with assessment of the pressure ulcer as below:  Pressure Injury 04/02/19 Sacrum Mid Stage 3 -  Full thickness tissue  loss. Subcutaneous fat may be visible but bone, tendon or muscle are NOT exposed. (Active)  04/02/19 2355  Location: Sacrum  Location Orientation: Mid  Staging: Stage 3 -  Full thickness tissue loss. Subcutaneous fat may be visible but bone, tendon or muscle are NOT exposed.  Wound Description (Comments):   Present on Admission: Yes         DVT prophylaxis: Warfarin Code Status: Full Family Communication: Self Coming From: Home Disposition Plan: Home Barriers to Discharge: Failure to clinically improve  Consultants:   ID  Wound team   Antimicrobials:  Anti-infectives (From admission, onward)   Start     Dose/Rate Route Frequency Ordered Stop   04/11/19 2000  ciprofloxacin (CIPRO) tablet 750 mg     750 mg Oral 2 times daily 04/11/19 1503     04/11/19 1600  meropenem (MERREM) 1 g in sodium chloride 0.9 % 100 mL IVPB  Status:  Discontinued     1 g 200 mL/hr over 30 Minutes Intravenous Every 8 hours 04/11/19 1111 04/11/19 1503   04/11/19 0000  meropenem (MERREM) IVPB  Status:  Discontinued     1 g Intravenous Every 8 hours 04/11/19 1108 04/11/19    04/10/19 1330  ertapenem (INVANZ) 1,000 mg in sodium chloride 0.9 % 100 mL IVPB  Status:  Discontinued     1 g 200 mL/hr over  30 Minutes Intravenous Every 24 hours 04/10/19 1230 04/11/19 1111   04/10/19 0000  ertapenem [facility]) IVPB  Status:  Discontinued     1 g Intravenous Every 24 hours 04/10/19 1152 04/11/19    04/09/19 0000  meropenem (MERREM) 1 g in sodium chloride 0.9 % 100 mL IVPB  Status:  Discontinued     1 g 200 mL/hr over 30 Minutes Intravenous Every 8 hours 04/08/19 2355 04/10/19 1229   04/08/19 2245  piperacillin-tazobactam (ZOSYN) IVPB 3.375 g  Status:  Discontinued     3.375 g 100 mL/hr over 30 Minutes Intravenous  Once 04/08/19 2235 04/08/19 2302       Objective: Vitals:   04/11/19 0925 04/11/19 1603 04/11/19 2020 04/12/19 0525  BP: 122/73 121/63 128/62 111/64  Pulse: 77 80 90 82  Resp:  15 16 18     Temp:  (!) 97.5 F (36.4 C) 97.6 F (36.4 C) 98.3 F (36.8 C)  TempSrc:  Oral Oral Oral  SpO2:  96% 95% 94%    Intake/Output Summary (Last 24 hours) at 04/12/2019 1017 Last data filed at 04/12/2019 0528 Gross per 24 hour  Intake 717 ml  Output 1320 ml  Net -603 ml    Examination:  General exam: Appears calm and comfortable  Respiratory system: Clear to auscultation. Respiratory effort normal. No respiratory distress. No conversational dyspnea.  Cardiovascular system: S1 & S2 heard, RRR. No murmurs. No pedal edema. Gastrointestinal system: Abdomen is nondistended, soft and nontender. Normal bowel sounds heard. Central nervous system: Alert and oriented. No focal neurological deficits. Speech clear.  Extremities: Symmetric in appearance  Skin: Pink patches and macules over the lower extremities, upper extremities, and trunk; did not appear improved from yesterday's pictures taken by the ID team; there is no excessive warmth or tenderness to palpation; lesions do blanch Psychiatry: Judgement and insight appear normal. Mood & affect appropriate.   Data Reviewed: I have personally reviewed following labs and imaging studies  CBC: Recent Labs  Lab 04/08/19 1909 04/09/19 0509 04/10/19 0857 04/11/19 0217 04/12/19 0209  WBC 5.9 5.6 5.4 6.8 8.0  NEUTROABS 4.0  --   --   --   --   HGB 10.6* 9.7* 10.5* 10.3* 10.2*  HCT 37.2 33.2* 37.4 36.8 36.0  MCV 80.7 78.7* 81.5 81.1 81.6  PLT 227 192 207 244 0000000   Basic Metabolic Panel: Recent Labs  Lab 04/06/19 0053 04/07/19 0434 04/08/19 1909 04/09/19 0509 04/10/19 0857 04/12/19 0209  NA 136  --  140 141 143 140  K 3.7  --  3.5 3.1* 4.0 3.9  CL 100  --  106 108 108 105  CO2 26  --  23 26 27 28   GLUCOSE 162*  --  142* 120* 145* 113*  BUN 11  --  8 6* 6* 12  CREATININE 0.80  --  0.85 0.80 0.80 0.81  CALCIUM 8.8*  --  9.3 8.9 9.2 9.3  MG 1.7 1.6*  --   --   --   --    GFR: Estimated Creatinine Clearance: 91.9 mL/min (by C-G formula  based on SCr of 0.81 mg/dL). Liver Function Tests: Recent Labs  Lab 04/08/19 1909 04/09/19 0509  AST 23 19  ALT 14 11  ALKPHOS 66 55  BILITOT 0.6 0.8  PROT 7.1 6.2*  ALBUMIN 3.2* 2.9*   Coagulation Profile: Recent Labs  Lab 04/08/19 1909 04/09/19 0509 04/10/19 0334 04/11/19 0217 04/12/19 0209  INR 1.8* 2.0* 2.6* 2.9* 2.7*   CBG:  Recent Labs  Lab 04/11/19 0801 04/11/19 1226 04/11/19 1741 04/11/19 2025 04/12/19 0735  GLUCAP 112* 120* 122* 194* 114*   Recent Results (from the past 240 hour(s))  Blood culture (routine x 2)     Status: Abnormal   Collection Time: 04/02/19  9:35 PM   Specimen: BLOOD  Result Value Ref Range Status   Specimen Description BLOOD RIGHT ARM  Final   Special Requests   Final    BOTTLES DRAWN AEROBIC AND ANAEROBIC Blood Culture adequate volume   Culture  Setup Time   Final    GRAM POSITIVE COCCI IN CLUSTERS AEROBIC BOTTLE ONLY CRITICAL RESULT CALLED TO, READ BACK BY AND VERIFIED WITH: PHARMD JEREMY F. [id number]     Culture (A)  Final    MICROCOCCUS LUTEUS/LYLAE Standardized susceptibility testing for this organism is not available. Performed at [facility], [place] [address] [place], [place] [address]    Report Status 04/06/2019 FINAL  Final  Blood culture (routine x 2)     Status: None   Collection Time: 04/02/19  9:35 PM   Specimen: BLOOD  Result Value Ref Range Status   Specimen Description BLOOD RIGHT ARM  Final   Special Requests   Final    BOTTLES DRAWN AEROBIC AND ANAEROBIC Blood Culture results may not be optimal due to an inadequate volume of blood received in culture bottles   Culture   Final    NO GROWTH 5 DAYS Performed at [facility], [place] [address] [place], [place] [address]    Report Status 04/07/2019 FINAL  Final  Respiratory Panel by RT PCR (Flu A&B, Covid) - Nasopharyngeal Swab     Status: None   Collection Time: 04/02/19 11:36 PM   Specimen: Nasopharyngeal Swab  Result Value Ref Range Status   SARS  Coronavirus 2 by RT PCR NEGATIVE NEGATIVE Final    Comment: (NOTE) SARS-CoV-2 target nucleic acids are NOT DETECTED. The SARS-CoV-2 RNA is generally detectable in upper respiratoy specimens during the acute phase of infection. The lowest concentration of SARS-CoV-2 viral copies this assay can detect is 131 copies/mL. A negative result does not preclude SARS-Cov-2 infection and should not be used as the sole basis for treatment or other patient management decisions. A negative result may occur with  improper specimen collection/handling, submission of specimen other than nasopharyngeal swab, presence of viral mutation(s) within the areas targeted by this assay, and inadequate number of viral copies (<131 copies/mL). A negative result must be combined with clinical observations, patient history, and epidemiological information. The expected result is Negative. Fact Sheet for Patients:  PinkCheek.be Fact Sheet for Healthcare Providers:  GravelBags.it This test is not yet ap proved or cleared by the [place] FDA and  has been authorized for detection and/or diagnosis of SARS-CoV-2 by FDA under an Emergency Use Authorization (EUA). This EUA will remain  in effect (meaning this test can be used) for the duration of the COVID-19 declaration under Section 564(b)(1) of the Act, 21 U.S.C. section 360bbb-3(b)(1), unless the authorization is terminated or revoked sooner.    Influenza A by PCR NEGATIVE NEGATIVE Final   Influenza B by PCR NEGATIVE NEGATIVE Final    Comment: (NOTE) The Xpert Xpress SARS-CoV-2/FLU/RSV assay is intended as an aid in  the diagnosis of influenza from Nasopharyngeal swab specimens and  should not be used as a sole basis for treatment. Nasal washings and  aspirates are unacceptable for Xpert Xpress SARS-CoV-2/FLU/RSV  testing. Fact Sheet for Patients: PinkCheek.be  Fact Sheet  for Healthcare Providers: GravelBags.it This test is not yet approved or cleared by the [place] FDA and  has been authorized for detection and/or diagnosis of SARS-CoV-2 by  FDA under an Emergency Use Authorization (EUA). This EUA will remain  in effect (meaning this test can be used) for the duration of the  Covid-19 declaration under Section 564(b)(1) of the Act, 21  U.S.C. section 360bbb-3(b)(1), unless the authorization is  terminated or revoked. Performed at [facility], [place] [address], [place], [place] [address]   Urine culture     Status: Abnormal   Collection Time: 04/02/19 11:43 PM   Specimen: In/Out Cath Urine  Result Value Ref Range Status   Specimen Description IN/OUT CATH URINE  Final   Special Requests STERILE CONTAINER  Final   Culture (A)  Final    >=100,000 COLONIES/mL KLEBSIELLA PNEUMONIAE 10,000 COLONIES/mL PSEUDOMONAS AERUGINOSA Confirmed Extended Spectrum Beta-Lactamase Producer (ESBL).  In bloodstream infections from ESBL organisms, carbapenems are preferred over piperacillin/tazobactam. They are shown to have a lower risk of mortality. FOR KLEBSIELLA PNEUMONIAE Performed at [facility], [place] [address], [place], [place] [address]    Report Status 04/05/2019 FINAL  Final   Organism ID, Bacteria KLEBSIELLA PNEUMONIAE (A)  Final   Organism ID, Bacteria PSEUDOMONAS AERUGINOSA (A)  Final      Susceptibility   Klebsiella pneumoniae - MIC*    AMPICILLIN >=32 RESISTANT Resistant     CEFAZOLIN >=64 RESISTANT Resistant     CEFTRIAXONE >=64 RESISTANT Resistant     CIPROFLOXACIN 1 SENSITIVE Sensitive     GENTAMICIN >=16 RESISTANT Resistant     IMIPENEM 0.5 SENSITIVE Sensitive     NITROFURANTOIN 64 INTERMEDIATE Intermediate     TRIMETH/SULFA >=320 RESISTANT Resistant     AMPICILLIN/SULBACTAM >=32 RESISTANT Resistant     PIP/TAZO 16 SENSITIVE Sensitive     * >=100,000 COLONIES/mL KLEBSIELLA PNEUMONIAE   Pseudomonas  aeruginosa - MIC*    CEFTAZIDIME 8 SENSITIVE Sensitive     CIPROFLOXACIN 0.5 SENSITIVE Sensitive     GENTAMICIN >=16 RESISTANT Resistant     IMIPENEM 2 SENSITIVE Sensitive     PIP/TAZO 16 SENSITIVE Sensitive     CEFEPIME 4 SENSITIVE Sensitive     * 10,000 COLONIES/mL PSEUDOMONAS AERUGINOSA  MRSA PCR Screening     Status: None   Collection Time: 04/03/19  6:01 AM   Specimen: Nasopharyngeal  Result Value Ref Range Status   MRSA by PCR NEGATIVE NEGATIVE Final    Comment:        The GeneXpert MRSA Assay (FDA approved for NASAL specimens only), is one component of a comprehensive MRSA colonization surveillance program. It is not intended to diagnose MRSA infection nor to guide or monitor treatment for MRSA infections. Performed at [facility], [place] [address] [place], [place] [address]   Culture, blood (Routine X 2) w Reflex to ID Panel     Status: None   Collection Time: 04/05/19 10:19 AM   Specimen: BLOOD RIGHT ARM  Result Value Ref Range Status   Specimen Description BLOOD RIGHT ARM  Final   Special Requests   Final    BOTTLES DRAWN AEROBIC ONLY Blood Culture adequate volume   Culture   Final    NO GROWTH 5 DAYS Performed at [facility], [address] [place],  [address]    Report Status 04/10/2019 FINAL  Final  Culture, blood (Routine X 2) w Reflex to ID Panel  Status: None   Collection Time: 04/05/19 10:25 AM   Specimen: BLOOD LEFT ARM  Result Value Ref Range Status   Specimen Description BLOOD LEFT ARM  Final   Special Requests   Final    BOTTLES DRAWN AEROBIC ONLY Blood Culture adequate volume   Culture   Final    NO GROWTH 5 DAYS Performed at [facility], [address], [place], [place] [address]    Report Status 04/10/2019 FINAL  Final  SARS CORONAVIRUS 2 (TAT 6-24 HRS) Nasopharyngeal Nasopharyngeal Swab     Status: None   Collection Time: 04/08/19  8:38 PM   Specimen: Nasopharyngeal Swab  Result Value Ref Range Status   SARS  Coronavirus 2 NEGATIVE NEGATIVE Final    Comment: (NOTE) SARS-CoV-2 target nucleic acids are NOT DETECTED. The SARS-CoV-2 RNA is generally detectable in upper and lower respiratory specimens during the acute phase of infection. Negative results do not preclude SARS-CoV-2 infection, do not rule out co-infections with other pathogens, and should not be used as the sole basis for treatment or other patient management decisions. Negative results must be combined with clinical observations, patient history, and epidemiological information. The expected result is Negative. Fact Sheet for Patients: SugarRoll.be Fact Sheet for Healthcare Providers: https://www.woods-mathews.com/ This test is not yet approved or cleared by the [place] FDA and  has been authorized for detection and/or diagnosis of SARS-CoV-2 by FDA under an Emergency Use Authorization (EUA). This EUA will remain  in effect (meaning this test can be used) for the duration of the COVID-19 declaration under Section 56 4(b)(1) of the Act, 21 U.S.C. section 360bbb-3(b)(1), unless the authorization is terminated or revoked sooner. Performed at [facility], [place] [address], [place], [place] [address]   Blood culture (routine x 2)     Status: None (Preliminary result)   Collection Time: 04/08/19 11:10 PM   Specimen: BLOOD LEFT HAND  Result Value Ref Range Status   Specimen Description BLOOD LEFT HAND  Final   Special Requests   Final    BOTTLES DRAWN AEROBIC AND ANAEROBIC Blood Culture adequate volume   Culture   Final    NO GROWTH 4 DAYS Performed at [facility], [place] [address] [place], [place] [address]    Report Status PENDING  Incomplete  Blood culture (routine x 2)     Status: None (Preliminary result)   Collection Time: 04/08/19 11:10 PM   Specimen: BLOOD RIGHT ARM  Result Value Ref Range Status   Specimen Description BLOOD RIGHT ARM  Final   Special Requests    Final    BOTTLES DRAWN AEROBIC AND ANAEROBIC Blood Culture adequate volume   Culture   Final    NO GROWTH 4 DAYS Performed at [facility], [place] [address], [place],  [address]    Report Status PENDING  Incomplete     Scheduled Meds: . acetaminophen  1,000 mg Oral TID  . ALPRAZolam  0.25 mg Oral TID  . ARIPiprazole  10 mg Oral Daily  . busPIRone  10 mg Oral BID  . Chlorhexidine Gluconate Cloth  6 each Topical Daily  . ciprofloxacin  750 mg Oral BID  . Dulaglutide  1.5 mg Subcutaneous Q Sat  . ezetimibe  10 mg Oral QHS  . ferrous sulfate  325 mg Oral Q breakfast  . gabapentin  300 mg Oral TID  . insulin aspart  0-15 Units Subcutaneous TID WC  . insulin glargine  25 Units  Subcutaneous QHS  . magnesium oxide  400 mg Oral TID  . metoprolol succinate  25 mg Oral Daily  . nystatin   Topical TID  . pramipexole  0.125 mg Oral QHS  . sertraline  100 mg Oral QHS  . spironolactone  12.5 mg Oral Daily  . warfarin  5 mg Oral ONCE-1800  . Warfarin - Pharmacist Dosing Inpatient   Does not apply q1800   Continuous Infusions:   LOS: 3 days    Time spent: 30 minutes   Shelda Pal, DO [facility] 04/12/2019, 10:17 AM   Available via Epic secure chat 7am-7pm After these hours, please refer to coverage provider listed on [email or web address]

## 2019-04-13 LAB — BASIC METABOLIC PANEL
Anion gap: 7 (ref 5–15)
BUN: 14 mg/dL (ref 8–23)
CO2: 28 mmol/L (ref 22–32)
Calcium: 9 mg/dL (ref 8.9–10.3)
Chloride: 104 mmol/L (ref 98–111)
Creatinine, Ser: 0.83 mg/dL (ref 0.44–1.00)
GFR calc Af Amer: >60 mL/min (ref >60)
GFR calc non Af Amer: >60 mL/min (ref >60)
Glucose, Bld: 151 mg/dL — ABNORMAL HIGH (ref 70–99)
Potassium: 3.8 mmol/L (ref 3.5–5.1)
Sodium: 139 mmol/L (ref 135–145)

## 2019-04-13 LAB — CULTURE, BLOOD (ROUTINE X 2)
Culture: NO GROWTH
Culture: NO GROWTH
Special Requests: ADEQUATE
Special Requests: ADEQUATE

## 2019-04-13 LAB — CBC
HCT: 34.6 % — ABNORMAL LOW (ref 36.0–46.0)
Hemoglobin: 9.8 g/dL — ABNORMAL LOW (ref 12.0–15.0)
MCH: 23 pg — ABNORMAL LOW (ref 26.0–34.0)
MCHC: 28.3 g/dL — ABNORMAL LOW (ref 30.0–36.0)
MCV: 81.2 fL (ref 80.0–100.0)
Platelets: 244 10*3/uL (ref 150–400)
RBC: 4.26 MIL/uL (ref 3.87–5.11)
RDW: 19.8 % — ABNORMAL HIGH (ref 11.5–15.5)
WBC: 6.4 10*3/uL (ref 4.0–10.5)
nRBC: 0 % (ref 0.0–0.2)

## 2019-04-13 LAB — GLUCOSE, CAPILLARY
Glucose-Capillary: 144 mg/dL — ABNORMAL HIGH (ref 70–99)
Glucose-Capillary: 153 mg/dL — ABNORMAL HIGH (ref 70–99)

## 2019-04-13 LAB — PROTIME-INR
INR: 2.3 — ABNORMAL HIGH (ref 0.8–1.2)
Prothrombin Time: 24.8 seconds — ABNORMAL HIGH (ref 11.4–15.2)

## 2019-04-13 MED ORDER — LORATADINE 10 MG PO TABS: 10.0000 mg | ORAL_TABLET | Freq: Every day | ORAL | Status: DC

## 2019-04-13 MED ORDER — CIPROFLOXACIN HCL 750 MG PO TABS: 750.0000 mg | ORAL_TABLET | Freq: Two times a day (BID) | ORAL | 0 refills | Status: AC

## 2019-04-13 MED ORDER — WARFARIN SODIUM 7.5 MG PO TABS
7.5000 mg | ORAL_TABLET | Freq: Once | ORAL | Status: DC
  Filled 2019-04-13: qty 1

## 2019-04-13 MED ORDER — PREDNISONE 20 MG PO TABS
40.0000 mg | ORAL_TABLET | Freq: Once | ORAL | Status: AC
  Administered 2019-04-13: 12:00:00 40 mg via ORAL
  Filled 2019-04-13: qty 2

## 2019-04-13 MED ORDER — PREDNISONE 20 MG PO TABS: 40.0000 mg | ORAL_TABLET | Freq: Every day | ORAL | 0 refills | Status: AC

## 2019-04-13 NOTE — Discharge Instructions (Signed)
Information on my medicine - Coumadin   (Warfarin)  This medication education was reviewed with me or my healthcare representative as part of my discharge preparation.   Why was Coumadin prescribed for you? Coumadin was prescribed for you because you have a blood clot or a medical condition that can cause an increased risk of forming blood clots. Blood clots can cause serious health problems by blocking the flow of blood to the heart, lung, or brain. Coumadin can prevent harmful blood clots from forming. As a reminder your indication for Coumadin is:   Stroke Prevention Because Of Atrial Fibrillation  What test will check on my response to Coumadin? While on Coumadin (warfarin) you will need to have an INR test regularly to ensure that your dose is keeping you in the desired range. The INR (international normalized ratio) number is calculated from the result of the laboratory test called prothrombin time (PT).  If an INR APPOINTMENT HAS NOT ALREADY BEEN MADE FOR YOU please schedule an appointment to have this lab work done by your health care provider within 7 days. Your INR goal is usually a number between:  2 to 3 or your provider may give you a more narrow range like 2-2.5.  Ask your health care provider during an office visit what your goal INR is.  What  do you need to  know  About  COUMADIN? Take Coumadin (warfarin) exactly as prescribed by your healthcare provider about the same time each day.  DO NOT stop taking without talking to the doctor who prescribed the medication.  Stopping without other blood clot prevention medication to take the place of Coumadin may increase your risk of developing a new clot or stroke.  Get refills before you run out.  What do you do if you miss a dose? If you miss a dose, take it as soon as you remember on the same day then continue your regularly scheduled regimen the next day.  Do not take two doses of Coumadin at the same time.  Important Safety  Information A possible side effect of Coumadin (Warfarin) is an increased risk of bleeding. You should call your healthcare provider right away if you experience any of the following: ? Bleeding from an injury or your nose that does not stop. ? Unusual colored urine (red or dark brown) or unusual colored stools (red or black). ? Unusual bruising for unknown reasons. ? A serious fall or if you hit your head (even if there is no bleeding).  Some foods or medicines interact with Coumadin (warfarin) and might alter your response to warfarin. To help avoid this: ? Eat a balanced diet, maintaining a consistent amount of Vitamin K. ? Notify your provider about major diet changes you plan to make. ? Avoid alcohol or limit your intake to 1 drink for women and 2 drinks for men per day. (1 drink is 5 oz. wine, 12 oz. beer, or 1.5 oz. liquor.)  Make sure that ANY health care provider who prescribes medication for you knows that you are taking Coumadin (warfarin).  Also make sure the healthcare provider who is monitoring your Coumadin knows when you have started a new medication including herbals and non-prescription products.  Coumadin (Warfarin)  Major Drug Interactions  Increased Warfarin Effect Decreased Warfarin Effect  Alcohol (large quantities) Antibiotics (esp. Septra/Bactrim, Flagyl, Cipro) Amiodarone (Cordarone) Aspirin (ASA) Cimetidine (Tagamet) Megestrol (Megace) NSAIDs (ibuprofen, naproxen, etc.) Piroxicam (Feldene) Propafenone (Rythmol SR) Propranolol (Inderal) Isoniazid (INH) Posaconazole (Noxafil) Barbiturates (Phenobarbital) Carbamazepine (  Tegretol) Chlordiazepoxide (Librium) Cholestyramine (Questran) Griseofulvin Oral Contraceptives Rifampin Sucralfate (Carafate) Vitamin K   Coumadin (Warfarin) Major Herbal Interactions  Increased Warfarin Effect Decreased Warfarin Effect  Garlic Ginseng Ginkgo biloba Coenzyme Q10 Green tea St. John's wort    Coumadin (Warfarin)  FOOD Interactions  Eat a consistent number of servings per week of foods HIGH in Vitamin K (1 serving =  cup)  Collards (cooked, or boiled & drained) Kale (cooked, or boiled & drained) Mustard greens (cooked, or boiled & drained) Parsley *serving size only =  cup Spinach (cooked, or boiled & drained) Swiss chard (cooked, or boiled & drained) Turnip greens (cooked, or boiled & drained)  Eat a consistent number of servings per week of foods MEDIUM-HIGH in Vitamin K (1 serving = 1 cup)  Asparagus (cooked, or boiled & drained) Broccoli (cooked, boiled & drained, or raw & chopped) Brussel sprouts (cooked, or boiled & drained) *serving size only =  cup Lettuce, raw (green leaf, endive, romaine) Spinach, raw Turnip greens, raw & chopped   These websites have more information on Coumadin (warfarin):  FailFactory.se; VeganReport.com.au;  You were cared for by a hospitalist during your hospital stay. If you have any questions about your discharge medications or the care you received while you were in the hospital after you are discharged, you can call the unit and ask to speak with the hospitalist on call if the hospitalist that took care of you is not available. Once you are discharged, your primary care physician will handle any further medical issues. Please note that NO REFILLS for any discharge medications will be authorized once you are discharged, as it is imperative that you return to your primary care physician (or establish a relationship with a primary care physician if you do not have one) for your aftercare needs so that they can reassess your need for medications and monitor your lab values.

## 2019-04-13 NOTE — TOC Transition Note (Signed)
Transition of Care [facility]) - CM/SW Discharge Note   Patient Details  Name: Donna [name] MRN: [id number] Date of Birth: February 28, 1951  [facility] of [facility] CM/SW Contact:  Marilu Favre, RN Phone Number: 04/13/2019, 2:39 PM   Clinical Narrative:     Patient from home with husband Joesph . Patient has home health RN and PT with [facility] , rearranged with Mateo Flow with [facility].   Patient has all DME, lift , wheel chair and walker.   Spoke to patient at bedside and husband via speaker phone, patient wants to return home today via [place]. Husband in agreement. PTAR called.   Final next level of care: [place] Barriers to Discharge: No Barriers Identified   Patient Goals and CMS Choice Patient states their goals for this hospitalization and ongoing recovery are:: to return home CMS Medicare.gov Compare Post Acute Care list provided to:: Patient Choice offered to / list presented to : Patient  Discharge Placement                       Discharge Plan and Services   Discharge Planning Services: CM Consult Post Acute Care Choice: Home Health          DME Arranged: N/A         HH Arranged: RN, PT [place] Agency: [facility] ([facility]) Date HH Agency Contacted: 04/13/19 Time [place]: [id number] Representative spoke with at [place]: [place] ([place]) Interventions     Readmission Risk Interventions No flowsheet data found.

## 2019-04-13 NOTE — Progress Notes (Signed)
[facility]  Date of Admission:  04/08/2019     Total days of antibiotics 12         ASSESSMENT:  Ms. Nishiyama continues to improve from likely drug-related allergic urticaria with decreased rash noted on physical exam today.  She appears to be tolerating her ciprofloxacin with no adverse side effects.  Continue supportive care for her urticaria.  Continue current dose of ciprofloxacin x6 weeks for sacral osteomyelitis through 05/15/19.   PLAN:  1. Continue ciprofloxacin. 2. Continue supportive care for allergic urticaria likely related to beta-lactam's. 3. Follow up in ID clinic on 3/3 with Dr. Tommy Medal as scheduled.   ID will sign off and be available as needed.   Principal Problem:   Allergic urticaria Active Problems:   Chronic atrial fibrillation ([place]): CHA2DS2-VASc Score 5; on Warfarin   Morbid obesity (HCC)   Essential hypertension   Depression with anxiety   Type 2 diabetes mellitus (HCC)   Decubitus ulcer of ischium, left, stage IV (HCC)   Sacral osteomyelitis (HCC)   Hypersensitivity   Rash and nonspecific skin eruption   . acetaminophen  1,000 mg Oral TID  . ALPRAZolam  0.25 mg Oral TID  . ARIPiprazole  10 mg Oral Daily  . busPIRone  10 mg Oral BID  . Chlorhexidine Gluconate Cloth  6 each Topical Daily  . ciprofloxacin  750 mg Oral BID  . Dulaglutide  1.5 mg Subcutaneous Q Sat  . ezetimibe  10 mg Oral QHS  . ferrous sulfate  325 mg Oral Q breakfast  . gabapentin  300 mg Oral TID  . insulin aspart  0-15 Units Subcutaneous TID WC  . insulin glargine  25 Units Subcutaneous QHS  . loratadine  10 mg Oral Daily  . magnesium oxide  400 mg Oral TID  . metoprolol succinate  25 mg Oral Daily  . nystatin   Topical TID  . pramipexole  0.125 mg Oral QHS  . sertraline  100 mg Oral QHS  . spironolactone  12.5 mg Oral Daily  . warfarin  7.5 mg Oral ONCE-1800  . Warfarin - Pharmacist Dosing Inpatient   Does not apply q1800     SUBJECTIVE:  Afebrile overnight with no acute events.  Continues to have generalized itching with some improvement in rash.  Tolerating the ciprofloxacin with no adverse side effects.  Wondering when she is ready to go home.   Allergies  Allergen Reactions  . Bee Venom Hives    Takes benadryl  . Other Other (See Comments)    Raw foods with seeds give her "boils".  Avoids raw strawberries, blueberries. Tolerates cooked fruits, tomato sauce, bread/grains with seeds. [facility] 10/17/13 Berries with seeds Allergy to glutamine-C-quercet-selen-brom per [facility] 09/25/17  . Zosyn [Piperacillin Sod-Tazobactam So] Rash    Itchy total body rash.  . Alteplase Rash  . Cefepime Rash  . Fentanyl Other (See Comments)    Hallucinations, syncope    . Metformin And Related Diarrhea  . Vancomycin Rash     Review of Systems: Review of Systems  Constitutional: Negative for chills, fever and weight loss.  Respiratory: Negative for cough, shortness of breath and wheezing.   Cardiovascular: Negative for chest pain and leg swelling.  Gastrointestinal: Negative for abdominal pain, constipation, diarrhea, nausea and vomiting.  Skin: Positive for rash.      OBJECTIVE: Vitals:   04/12/19 0525 04/12/19 1454 04/12/19 2133 04/13/19 0549  BP: 111/64 123/67 113/64 108/60  Pulse: 82 87  93 86  Resp: 18 18 16 15   Temp: 98.3 F (36.8 C) 97.7 F (36.5 C) 97.9 F (36.6 C) 97.7 F (36.5 C)  TempSrc: Oral Oral Oral Oral  SpO2: 94% 96% 96% 90%   There is no height or weight on file to calculate BMI.  Physical Exam Constitutional:      General: She is not in acute distress.    Appearance: She is well-developed.  Cardiovascular:     Rate and Rhythm: Normal rate and regular rhythm.     Heart sounds: Normal heart sounds.  Pulmonary:     Effort: Pulmonary effort is normal.     Breath sounds: Normal breath sounds.  Skin:    General: Skin is warm and dry.     Comments: Rash appears significantly improved on  arms and abdomen.  Trunk remains with redness and maculopapular rash.  Neurological:     Mental Status: She is alert and oriented to person, place, and time.  Psychiatric:        Behavior: Behavior normal.        Thought Content: Thought content normal.        Judgment: Judgment normal.     Lab Results Lab Results  Component Value Date   WBC 6.4 04/13/2019   HGB 9.8 (L) 04/13/2019   HCT 34.6 (L) 04/13/2019   MCV 81.2 04/13/2019   PLT 244 04/13/2019    Lab Results  Component Value Date   CREATININE 0.83 04/13/2019   BUN 14 04/13/2019   NA 139 04/13/2019   K 3.8 04/13/2019   CL 104 04/13/2019   CO2 28 04/13/2019    Lab Results  Component Value Date   ALT 11 04/09/2019   AST 19 04/09/2019   ALKPHOS 55 04/09/2019   BILITOT 0.8 04/09/2019     Microbiology: Recent Results (from the past 240 hour(s))  Culture, blood (Routine X 2) w Reflex to ID Panel     Status: None   Collection Time: 04/05/19 10:19 AM   Specimen: BLOOD RIGHT ARM  Result Value Ref Range Status   Specimen Description BLOOD RIGHT ARM  Final   Special Requests   Final    BOTTLES DRAWN AEROBIC ONLY Blood Culture adequate volume   Culture   Final    NO GROWTH 5 DAYS Performed at [facility], [address], [place], [place] [address]    Report Status 04/10/2019 FINAL  Final  Culture, blood (Routine X 2) w Reflex to ID Panel     Status: None   Collection Time: 04/05/19 10:25 AM   Specimen: BLOOD LEFT ARM  Result Value Ref Range Status   Specimen Description BLOOD LEFT ARM  Final   Special Requests   Final    BOTTLES DRAWN AEROBIC ONLY Blood Culture adequate volume   Culture   Final    NO GROWTH 5 DAYS Performed at [facility], [place] [address], [place], [place] [address]    Report Status 04/10/2019 FINAL  Final  SARS CORONAVIRUS 2 (TAT 6-24 HRS) Nasopharyngeal Nasopharyngeal Swab     Status: None   Collection Time: 04/08/19  8:38 PM   Specimen: Nasopharyngeal Swab  Result Value Ref  Range Status   SARS Coronavirus 2 NEGATIVE NEGATIVE Final    Comment: (NOTE) SARS-CoV-2 target nucleic acids are NOT DETECTED. The SARS-CoV-2 RNA is generally detectable in upper and lower respiratory specimens during the acute phase of infection. Negative results do not preclude SARS-CoV-2 infection, do not  rule out co-infections with other pathogens, and should not be used as the sole basis for treatment or other patient management decisions. Negative results must be combined with clinical observations, patient history, and epidemiological information. The expected result is Negative. Fact Sheet for Patients: SugarRoll.be Fact Sheet for Healthcare Providers: https://www.woods-mathews.com/ This test is not yet approved or cleared by the [place] FDA and  has been authorized for detection and/or diagnosis of SARS-CoV-2 by FDA under an Emergency Use Authorization (EUA). This EUA will remain  in effect (meaning this test can be used) for the duration of the COVID-19 declaration under Section 56 4(b)(1) of the Act, 21 U.S.C. section 360bbb-3(b)(1), unless the authorization is terminated or revoked sooner. Performed at [facility], [place] [address] [place], [place] [address]   Blood culture (routine x 2)     Status: None   Collection Time: 04/08/19 11:10 PM   Specimen: BLOOD LEFT HAND  Result Value Ref Range Status   Specimen Description BLOOD LEFT HAND  Final   Special Requests   Final    BOTTLES DRAWN AEROBIC AND ANAEROBIC Blood Culture adequate volume   Culture   Final    NO GROWTH 5 DAYS Performed at [facility], [place] [address] [place], [place] [address]    Report Status 04/13/2019 FINAL  Final  Blood culture (routine x 2)     Status: None   Collection Time: 04/08/19 11:10 PM   Specimen: BLOOD RIGHT ARM  Result Value Ref Range Status   Specimen Description BLOOD RIGHT ARM  Final   Special Requests   Final     BOTTLES DRAWN AEROBIC AND ANAEROBIC Blood Culture adequate volume   Culture   Final    NO GROWTH 5 DAYS Performed at [facility], [place] [address] [place], [place] [address]    Report Status 04/13/2019 FINAL  Final     Terri Piedra, NP [place] [facility] [phone] Pager  04/13/2019  9:30 AM

## 2019-04-13 NOTE — Discharge Summary (Addendum)
Physician Discharge Summary  Donna [name] [id number] DOB: 05-04-1950 DOA: 04/08/2019  PCP: Janine Limbo, PA-C  Admit date: 04/08/2019 Discharge date: 04/13/2019  Admitted From: Home Disposition:  Home  Recommendations for Outpatient Follow-up:  1. Follow up with PCP in 1-2 weeks. 2. Follow up with ID on 05/04/19. 3. Follow up with wound team on 04/22/19. 4. Please obtain BMP/CBC in 1 week    Discharge Condition: Good CODE STATUS: Full  Diet recommendation: Heart healthy  Brief/Interim Summary: Patient was discharged on 2/4 from [facility] for a sacral ulcer/osteomyelitis on Zosyn with a PICC line.  After she took her first dose at home, she broke out in a rash and sought care here on 2/5 and was readmitted.  She was initially changed from Zosyn to ertapenem.  When the rash did not improve, she was changed to oral ciprofloxacin.  She never had any swelling or required supplemental oxygen.  The plan is to keep her on ciprofloxacin 750 mg twice daily until March 14.  She is also taking a daily antihistamine and started a prednisone burst of 5 days on 2/10.  The infectious disease team helped coordinate these changes.  Subjective on day of discharge: Rash is improving, still having some itching.  First dose of Claritin was yesterday.  Starting on a 5-day prednisone burst.  No shortness of breath or swelling.  Discharge Diagnoses:  Principal Problem:   Allergic urticaria Active Problems:   Chronic atrial fibrillation ([place]): CHA2DS2-VASc Score 5; on Warfarin   Morbid obesity (HCC)   Essential hypertension   Depression with anxiety   Type 2 diabetes mellitus (HCC)   Decubitus ulcer of ischium, left, stage IV (HCC)   Sacral osteomyelitis (HCC)   Hypersensitivity   Rash and nonspecific skin eruption  In agreement with assessment of the pressure ulcer as below:  Pressure Injury 04/02/19 Sacrum Mid Stage 3 -  Full thickness tissue loss. Subcutaneous fat may be visible but bone, tendon or  muscle are NOT exposed. (Active)  04/02/19 2355  Location: Sacrum  Location Orientation: Mid  Staging: Stage 3 -  Full thickness tissue loss. Subcutaneous fat may be visible but bone, tendon or muscle are NOT exposed.  Wound Description (Comments):   Present on Admission: Yes       Discharge Instructions  Discharge Instructions    Call MD for:  difficulty breathing, headache or visual disturbances   Complete by: As directed    Call MD for:  temperature >100.4   Complete by: As directed    Diet - low sodium heart healthy   Complete by: As directed    Home infusion instructions   Complete by: As directed    Instructions: Flushing of vascular access device: 0.9% NaCl pre/post medication administration and prn patency; Heparin 100 u/ml, 80ml for implanted ports and Heparin 10u/ml, 56ml for all other central venous catheters.   Home infusion instructions   Complete by: As directed    Instructions: Flushing of vascular access device: 0.9% NaCl pre/post medication administration and prn patency; Heparin 100 u/ml, 8ml for implanted ports and Heparin 10u/ml, 48ml for all other central venous catheters.   Increase activity slowly   Complete by: As directed      Allergies as of 04/13/2019      Reactions   Bee Venom Hives   Takes benadryl   Other Other (See Comments)   Raw foods with seeds give her "boils".  Avoids raw strawberries, blueberries. Tolerates cooked fruits, tomato sauce, bread/grains with  seeds. [facility] 10/17/13 Berries with seeds Allergy to glutamine-C-quercet-selen-brom per [facility] 09/25/17   Zosyn [piperacillin Sod-tazobactam So] Rash   Itchy total body rash.   Alteplase Rash   Cefepime Rash   Fentanyl Other (See Comments)   Hallucinations, syncope     Metformin And Related Diarrhea   Vancomycin Rash      Medication List    TAKE these medications   acetaminophen 500 MG tablet Commonly known as: TYLENOL Take 1,000 mg by mouth 3 (three) times daily.   ALPRAZolam 0.5 MG  tablet Commonly known as: XANAX Take 0.25 mg by mouth 3 (three) times daily.   ARIPiprazole 10 MG tablet Commonly known as: ABILIFY Take 10 mg by mouth daily.   Basaglar KwikPen 100 UNIT/ML Sopn Inject 0.25 mLs (25 Units total) into the skin at bedtime.   busPIRone 10 MG tablet Commonly known as: BUSPAR Take 10 mg by mouth 2 (two) times daily.   ciprofloxacin 750 MG tablet Commonly known as: CIPRO Take 1 tablet (750 mg total) by mouth 2 (two) times daily.   docusate sodium 100 MG capsule Commonly known as: COLACE Take 100 mg by mouth daily as needed for mild constipation.   ezetimibe 10 MG tablet Commonly known as: ZETIA Take 10 mg by mouth at bedtime.   ferrous sulfate 325 (65 FE) MG tablet Take 325 mg by mouth daily with breakfast.   furosemide 20 MG tablet Commonly known as: LASIX Take 1 tablet (20 mg total) by mouth daily.   gabapentin 300 MG capsule Commonly known as: NEURONTIN Take 1 capsule (300 mg total) by mouth 3 (three) times daily.   insulin regular 100 units/mL injection Commonly known as: NOVOLIN R Inject 0-12 Units into the skin 3 (three) times daily before meals. 0-200 0 units 201-250 6 units 251-300 8 units 301-350 10 units 351-400 12 units Greater than 400 call MD   Jantoven 5 MG tablet Generic drug: warfarin Take 5 mg by mouth See admin instructions. Take as directed by MD per weekly levels   Jantoven 2 MG tablet Generic drug: warfarin Take 2 mg by mouth See admin instructions. Take as directed by MD per weekly levels   warfarin 1 MG tablet Commonly known as: COUMADIN Take 1 mg by mouth See admin instructions. Take as directed by MD per weekly levels   Jantoven 3 MG tablet Generic drug: warfarin Take 3 mg by mouth See admin instructions. Take as directed by MD per weekly levels   lidocaine 5 % Commonly known as: LIDODERM Place 1 patch onto the skin daily as needed (right hip pain). Remove & Discard patch within 12 hours or as  directed by MD   loperamide 2 MG tablet Commonly known as: IMODIUM A-D Take 2 mg by mouth 4 (four) times daily as needed for diarrhea or loose stools.   loratadine 10 MG tablet Commonly known as: CLARITIN Take 1 tablet (10 mg total) by mouth daily. Start taking on: April 14, 2019   magnesium oxide 400 MG tablet Commonly known as: MAG-OX Take 1 tablet (400 mg total) by mouth 3 (three) times daily. ALSO TAKE A EXTRA 400 MG TABLET ON THE DAYS YOU TAKE ZAROXOLYN TABLET What changed: additional instructions   metoprolol succinate 25 MG 24 hr tablet Commonly known as: TOPROL-XL Take 1 tablet (25 mg total) by mouth daily.   nystatin powder Commonly known as: MYCOSTATIN/NYSTOP Apply topically 3 (three) times daily for 10 days. Apply to abdominal folds and folds under the breast/redness area.  omeprazole 20 MG capsule Commonly known as: PRILOSEC Take 20 mg by mouth daily as needed (acid reflux/indigestion).   oxyCODONE 5 MG immediate release tablet Commonly known as: Oxy IR/ROXICODONE Take 1 tablet (5 mg total) by mouth every 6 (six) hours as needed for severe pain.   potassium chloride SA 20 MEQ tablet Commonly known as: KLOR-CON Take 1 tablet (20 mEq total) by mouth daily. TAKE AN EXTRA 20 MEQ ON THE DAYS YOU TAKE ZAROXLYN   pramipexole 0.125 MG tablet Commonly known as: MIRAPEX Take 0.125 mg by mouth at bedtime.   predniSONE 20 MG tablet Commonly known as: DELTASONE Take 2 tablets (40 mg total) by mouth daily with breakfast for 4 doses. Start taking on: April 14, 2019   sertraline 100 MG tablet Commonly known as: ZOLOFT Take 100 mg by mouth at bedtime.   spironolactone 25 MG tablet Commonly known as: ALDACTONE Take 0.5 tablets (12.5 mg total) by mouth daily.   Trulicity 1.5 0000000 Sopn Generic drug: Dulaglutide Inject 1.5 mg into the skin every Saturday.            Home Infusion Instuctions  (From admission, onward)         Start     Ordered    04/11/19 0000  Home infusion instructions    Question:  Instructions  Answer:  Flushing of vascular access device: 0.9% NaCl pre/post medication administration and prn patency; Heparin 100 u/ml, 80ml for implanted ports and Heparin 10u/ml, 58ml for all other central venous catheters.   04/11/19 1108   04/10/19 0000  Home infusion instructions    Question:  Instructions  Answer:  Flushing of vascular access device: 0.9% NaCl pre/post medication administration and prn patency; Heparin 100 u/ml, 64ml for implanted ports and Heparin 10u/ml, 11ml for all other central venous catheters.   04/10/19 1152         Follow-up Information    O'Buch, Greta, PA-C Follow up in 1 week(s).   Specialty: Internal Medicine Contact information: [address] [place] [address] [phone]        Leonie Man, MD .   Specialty: Cardiology Contact information: [address] [place] [address] [phone]        [facility] Follow up.        [facility] Follow up.          Allergies  Allergen Reactions  . Bee Venom Hives    Takes benadryl  . Other Other (See Comments)    Raw foods with seeds give her "boils".  Avoids raw strawberries, blueberries. Tolerates cooked fruits, tomato sauce, bread/grains with seeds. [facility] 10/17/13 Berries with seeds Allergy to glutamine-C-quercet-selen-brom per [facility] 09/25/17  . Zosyn [Piperacillin Sod-Tazobactam So] Rash    Itchy total body rash.  . Alteplase Rash  . Cefepime Rash  . Fentanyl Other (See Comments)    Hallucinations, syncope    . Metformin And Related Diarrhea  . Vancomycin Rash    Consultations:  ID   Procedures/Studies: CT ABDOMEN PELVIS W CONTRAST  Result Date: 04/03/2019 CLINICAL DATA:  Abdominal pain, diarrhea, sepsis EXAM: CT ABDOMEN AND PELVIS WITH CONTRAST TECHNIQUE: Multidetector CT imaging of the abdomen and pelvis was performed using the standard protocol following bolus administration of  intravenous contrast. CONTRAST:  162mL OMNIPAQUE IOHEXOL 300 MG/ML  SOLN COMPARISON:  CT abdomen pelvis 01/28/2019, CT abdomen pelvis 09/03/2017 FINDINGS: Lower chest: Lung bases are clear. Borderline cardiomegaly. Dense mitral annular calcifications. Coronary artery calcifications are  present. No pericardial effusion. Hepatobiliary: No focal liver abnormality is seen. Patient is post cholecystectomy. Slight prominence of the biliary tree likely related to reservoir effect. No calcified intraductal gallstones. Pancreas: Unremarkable. No pancreatic ductal dilatation or surrounding inflammatory changes. Spleen: Mild splenomegaly. No focal splenic lesions. Adrenals/Urinary Tract: Normal adrenal glands. There is bilateral perinephric stranding, nonspecific. Multiple bilateral fluid attenuation cysts are seen in both kidneys, largest is a bilobed cysts in the left upper pole measuring 2.4 cm. Bilateral cortical scarring is noted as well. There is mild right hydroureteronephrosis without visible obstructing calculus though the distal ureter and ureterovesicular junction is largely obscured by streak artifact from patient's right hip prosthesis. No left urinary tract dilatation or calcification. Question some mild faint perivesicular stranding. Stomach/Bowel: Distal esophagus, stomach and duodenal sweep are unremarkable. No small bowel wall thickening or dilatation. No evidence of obstruction. A normal appendix is visualized. Scattered colonic diverticula without focal pericolonic inflammation to suggest diverticulitis. Vascular/Lymphatic: Atherosclerotic plaque within the normal caliber aorta. No suspicious or enlarged lymph nodes in the included lymphatic chains. Reproductive: Uterus is surgically absent. No concerning adnexal lesions. Other: No abdominopelvic free fluid or air. Right abdominal wall laxity. Circumferential body wall edema. There is focal rim enhancing fluid collection superficial to the sacrum measuring  3.4 x 1.2 cm (3/68). Small punctate focus of gas is noted as well (7/81). Additionally, there is an indeterminate presacral structure, either fluid collection or intermediate attenuation mass measuring 50 Hounsfield units. This is not significantly changed in size or appearance from [id number] though certainly reflect some interval increase in size measuring up to 4 cm on today's exam, previously 3 cm on study from September 03, 2017. Musculoskeletal: There is right abdominal wall laxity with notable atrophy of the right rectus sheath. Multilevel degenerative changes are present in the spine including bony fusion across many of the lower lumbar levels and severe interspinous arthrosis. Abrupt angulation of the sacrococcygeal junction is noted and is in close proximity to presacral collection, clear these are related findings. IMPRESSION: 1. 3.4 x 1.2 cm rim enhancing fluid collection superficial to the sacrum is suspicious for abscess. Punctate foci is of adjacent gas could be seen with decubitus ulceration but more aggressive soft tissue infection should be excluded on a clinical basis. 2. Indeterminate presacral lesion, increasing in size since 2019. Could be related to the adjacent fracture of the sacrococcygeal junction though overall remains indeterminate. 3. Bladder wall thickening with some urothelial thickening particular on the right concerning for potential ascending urinary tract infection. No visualized urolithiasis though distal evaluation is limited by streak from right hip arthroplasty. 4. Circumferential body wall edema. Correlate for features of anasarca. 5.  Aortic Atherosclerosis (ICD10-I70.0). And Critical Value/emergent results were called by telephone at the time of interpretation on 04/03/2019 at 1:45 am to provider [facility] , who verbally acknowledged these results. Electronically Signed   By: Lovena Le M.D.   On: 04/03/2019 01:45   MR SACRUM SI JOINTS W WO CONTRAST  Result Date:  04/06/2019 CLINICAL DATA:  Chronic sacral decubitus ulcer. EXAM: MRI PELVIS WITHOUT AND WITH CONTRAST TECHNIQUE: Multiplanar multisequence MR imaging of the pelvis was performed both before and after administration of intravenous contrast. CONTRAST:  2mL GADAVIST GADOBUTROL 1 MMOL/ML IV SOLN COMPARISON:  CT scan 04/03/2019 FINDINGS: There is a deep sacral decubitus ulcer extending down adjacent to the lower sacral segments. Enhancing inflammatory granulation tissue around the ulcer but no abscess. Mild surrounding cellulitis. No evidence of myofasciitis involving the atrophied gluteal  muscles. Small focus of abnormal T2 signal intensity and subsequent enhancement in the distal sacral segment consistent with osteomyelitis. Again noted is a 5 cm presacral mass. This has areas of fatty signal intensity and is most likely a benign sacral myelolipoma. No findings for septic arthritis involving the SI joints. Significant artifact from a right hip prosthesis. No intrapelvic abscess is identified. Moderate to large amount of stool is noted in the rectum. IMPRESSION: 1. Deep sacral decubitus ulcer extending down to the lower sacral segments. 2. Small focus of osteomyelitis involving the distal sacral segment. 3. No drainable soft tissue abscess or intrapelvic abscess. 4. 5 cm presacral mass most likely a benign sacral myelolipoma. Electronically Signed   By: Marijo Sanes M.D.   On: 04/06/2019 08:10   DG Chest Port 1 View  Result Date: 04/06/2019 CLINICAL DATA:  Status post PICC placement. EXAM: PORTABLE CHEST 1 VIEW COMPARISON:  Single-view of the chest 04/02/2019. FINDINGS: Right PICC is in place with the tip projecting in the lower superior vena cava. Lungs are clear. No pneumothorax or pleural effusion. Cardiomegaly noted. The patient is status post right shoulder surgery. IMPRESSION: Tip of right PICC projects in the lower superior vena cava. Cardiomegaly. Atherosclerosis. Electronically Signed   By: Inge Rise  M.D.   On: 04/06/2019 10:53   DG Chest Port 1 View  Result Date: 04/02/2019 CLINICAL DATA:  Cough.  Hypoxia. EXAM: PORTABLE CHEST 1 VIEW COMPARISON:  01/28/2019 FINDINGS: The heart size is enlarged. Aortic calcifications are noted. There is no pneumothorax. No large pleural effusion. No focal infiltrate. Degenerative changes are noted of both glenohumeral joints and both AC joints. There is no obvious acute osseous abnormality. IMPRESSION: No active disease. Electronically Signed   By: Constance Holster M.D.   On: 04/02/2019 22:37   CT HEAD CODE STROKE WO CONTRAST  Result Date: 04/02/2019 CLINICAL DATA:  Code stroke. 69 year old female with abnormal speech. EXAM: CT HEAD WITHOUT CONTRAST TECHNIQUE: Contiguous axial images were obtained from the base of the skull through the vertex without intravenous contrast. COMPARISON:  Brain MRI 06/18/2011. Head CT 09/24/2017. FINDINGS: Brain: Chronic partially empty sella. Stable cerebral volume. No midline shift, ventriculomegaly, mass effect, evidence of mass lesion, intracranial hemorrhage or evidence of cortically based acute infarction. Mild for age scattered white matter hypodensity appears stable. No cortical encephalomalacia identified. Vascular: Calcified atherosclerosis at the skull base. No suspicious intracranial vascular hyperdensity. Skull: No acute osseous abnormality identified. Sinuses/Orbits: Visualized paranasal sinuses and mastoids are stable and well pneumatized. Other: No acute orbit or scalp soft tissue finding. ASPECTS [facility] Stroke Program Early CT Score) Total score (0-10 with 10 being normal): 10 IMPRESSION: Stable since 2019 and largely negative for age noncontrast CT appearance of the brain. ASPECTS 10. These results were communicated to Dr. Lorraine Lax at 9:16 pm on 1/30/2021by text page via the [facility] messaging system. Electronically Signed   By: Genevie Ann M.D.   On: 04/02/2019 21:17   [place] EKG SITE RITE  Result Date: 04/04/2019 If Site Rite  image not attached, placement could not be confirmed due to current cardiac rhythm.      Discharge Exam: Vitals:   04/12/19 2133 04/13/19 0549  BP: 113/64 108/60  Pulse: 93 86  Resp: 16 15  Temp: 97.9 F (36.6 C) 97.7 F (36.5 C)  SpO2: 96% 90%     General: Pt is alert, awake, not in acute distress Cardiovascular: RRR, S1/S2 +, no edema Respiratory: CTA bilaterally, no wheezing, no rhonchi, no respiratory distress,  no conversational dyspnea  Abdominal: Soft, NT, ND, bowel sounds + Skin: Salmon colored patches/macules over the trunk and all 4 extremities; appears less widespread than yesterday.  Mild scaling.  There is no fluctuance, tenderness to palpation, or excessive warmth.  The lesions all blanch. Extremities: no edema, no cyanosis Psych: Normal mood and affect, stable judgement and insight     The results of significant diagnostics from this hospitalization (including imaging, microbiology, ancillary and laboratory) are listed below for reference.     Microbiology: Recent Results (from the past 240 hour(s))  Culture, blood (Routine X 2) w Reflex to ID Panel     Status: None   Collection Time: 04/05/19 10:19 AM   Specimen: BLOOD RIGHT ARM  Result Value Ref Range Status   Specimen Description BLOOD RIGHT ARM  Final   Special Requests   Final    BOTTLES DRAWN AEROBIC ONLY Blood Culture adequate volume   Culture   Final    NO GROWTH 5 DAYS Performed at [facility], [address], [place], [place] [address]    Report Status 04/10/2019 FINAL  Final  Culture, blood (Routine X 2) w Reflex to ID Panel     Status: None   Collection Time: 04/05/19 10:25 AM   Specimen: BLOOD LEFT ARM  Result Value Ref Range Status   Specimen Description BLOOD LEFT ARM  Final   Special Requests   Final    BOTTLES DRAWN AEROBIC ONLY Blood Culture adequate volume   Culture   Final    NO GROWTH 5 DAYS Performed at [facility], [place] [address], [place], [place] [address]     Report Status 04/10/2019 FINAL  Final  SARS CORONAVIRUS 2 (TAT 6-24 HRS) Nasopharyngeal Nasopharyngeal Swab     Status: None   Collection Time: 04/08/19  8:38 PM   Specimen: Nasopharyngeal Swab  Result Value Ref Range Status   SARS Coronavirus 2 NEGATIVE NEGATIVE Final    Comment: (NOTE) SARS-CoV-2 target nucleic acids are NOT DETECTED. The SARS-CoV-2 RNA is generally detectable in upper and lower respiratory specimens during the acute phase of infection. Negative results do not preclude SARS-CoV-2 infection, do not rule out co-infections with other pathogens, and should not be used as the sole basis for treatment or other patient management decisions. Negative results must be combined with clinical observations, patient history, and epidemiological information. The expected result is Negative. Fact Sheet for Patients: SugarRoll.be Fact Sheet for Healthcare Providers: https://www.woods-mathews.com/ This test is not yet approved or cleared by the [place] FDA and  has been authorized for detection and/or diagnosis of SARS-CoV-2 by FDA under an Emergency Use Authorization (EUA). This EUA will remain  in effect (meaning this test can be used) for the duration of the COVID-19 declaration under Section 56 4(b)(1) of the Act, 21 U.S.C. section 360bbb-3(b)(1), unless the authorization is terminated or revoked sooner. Performed at [facility], [place] [address], [place], [place] [address]   Blood culture (routine x 2)     Status: None   Collection Time: 04/08/19 11:10 PM   Specimen: BLOOD LEFT HAND  Result Value Ref Range Status   Specimen Description BLOOD LEFT HAND  Final   Special Requests   Final    BOTTLES DRAWN AEROBIC AND ANAEROBIC Blood Culture adequate volume   Culture   Final    NO GROWTH 5 DAYS Performed at [facility], [place] [address] [place], [place] [address]    Report Status  04/13/2019 FINAL  Final  Blood culture  (routine x 2)     Status: None   Collection Time: 04/08/19 11:10 PM   Specimen: BLOOD RIGHT ARM  Result Value Ref Range Status   Specimen Description BLOOD RIGHT ARM  Final   Special Requests   Final    BOTTLES DRAWN AEROBIC AND ANAEROBIC Blood Culture adequate volume   Culture   Final    NO GROWTH 5 DAYS Performed at [facility], [address] [place], [place] [address]    Report Status 04/13/2019 FINAL  Final     Labs:Basic Metabolic Panel: Recent Labs  Lab 04/07/19 0434 04/08/19 1909 04/09/19 0509 04/10/19 0857 04/12/19 0209 04/13/19 0200  NA  --  140 141 143 140 139  K  --  3.5 3.1* 4.0 3.9 3.8  CL  --  106 108 108 105 104  CO2  --  23 26 27 28 28   GLUCOSE  --  142* 120* 145* 113* 151*  BUN  --  8 6* 6* 12 14  CREATININE  --  0.85 0.80 0.80 0.81 0.83  CALCIUM  --  9.3 8.9 9.2 9.3 9.0  MG 1.6*  --   --   --   --   --    Liver Function Tests: Recent Labs  Lab 04/08/19 1909 04/09/19 0509  AST 23 19  ALT 14 11  ALKPHOS 66 55  BILITOT 0.6 0.8  PROT 7.1 6.2*  ALBUMIN 3.2* 2.9*   CBC: Recent Labs  Lab 04/08/19 1909 04/08/19 1909 04/09/19 0509 04/10/19 0857 04/11/19 0217 04/12/19 0209 04/13/19 0200  WBC 5.9   < > 5.6 5.4 6.8 8.0 6.4  NEUTROABS 4.0  --   --   --   --   --   --   HGB 10.6*   < > 9.7* 10.5* 10.3* 10.2* 9.8*  HCT 37.2   < > 33.2* 37.4 36.8 36.0 34.6*  MCV 80.7   < > 78.7* 81.5 81.1 81.6 81.2  PLT 227   < > 192 207 244 249 244   < > = values in this interval not displayed.   CBG: Recent Labs  Lab 04/12/19 1147 04/12/19 1718 04/12/19 2131 04/13/19 0747 04/13/19 1154  GLUCAP 193* 104* 156* 144* 153*   Urinalysis    Component Value Date/Time   COLORURINE YELLOW 04/02/2019 2343   APPEARANCEUR CLOUDY (A) 04/02/2019 2343   LABSPEC 1.014 04/02/2019 2343   PHURINE 5.0 04/02/2019 2343   GLUCOSEU NEGATIVE 04/02/2019 2343   HGBUR SMALL (A) 04/02/2019 2343   BILIRUBINUR NEGATIVE 04/02/2019 [place] 04/02/2019  2343   PROTEINUR NEGATIVE 04/02/2019 2343   UROBILINOGEN 1.0 09/06/2010 1130   NITRITE NEGATIVE 04/02/2019 2343   LEUKOCYTESUR LARGE (A) 04/02/2019 2343   Microbiology Recent Results (from the past 240 hour(s))  Culture, blood (Routine X 2) w Reflex to ID Panel     Status: None   Collection Time: 04/05/19 10:19 AM   Specimen: BLOOD RIGHT ARM  Result Value Ref Range Status   Specimen Description BLOOD RIGHT ARM  Final   Special Requests   Final    BOTTLES DRAWN AEROBIC ONLY Blood Culture adequate volume   Culture   Final    NO GROWTH 5 DAYS Performed at [facility], [address] [place], [place] [address]    Report Status 04/10/2019 FINAL  Final  Culture, blood (Routine X 2) w Reflex to ID Panel  Status: None   Collection Time: 04/05/19 10:25 AM   Specimen: BLOOD LEFT ARM  Result Value Ref Range Status   Specimen Description BLOOD LEFT ARM  Final   Special Requests   Final    BOTTLES DRAWN AEROBIC ONLY Blood Culture adequate volume   Culture   Final    NO GROWTH 5 DAYS Performed at [facility], [address], [place], [place] [address]    Report Status 04/10/2019 FINAL  Final  SARS CORONAVIRUS 2 (TAT 6-24 HRS) Nasopharyngeal Nasopharyngeal Swab     Status: None   Collection Time: 04/08/19  8:38 PM   Specimen: Nasopharyngeal Swab  Result Value Ref Range Status   SARS Coronavirus 2 NEGATIVE NEGATIVE Final    Comment: (NOTE) SARS-CoV-2 target nucleic acids are NOT DETECTED. The SARS-CoV-2 RNA is generally detectable in upper and lower respiratory specimens during the acute phase of infection. Negative results do not preclude SARS-CoV-2 infection, do not rule out co-infections with other pathogens, and should not be used as the sole basis for treatment or other patient management decisions. Negative results must be combined with clinical observations, patient history, and epidemiological information. The expected result is Negative. Fact Sheet for  Patients: SugarRoll.be Fact Sheet for Healthcare Providers: https://www.woods-mathews.com/ This test is not yet approved or cleared by the [place] FDA and  has been authorized for detection and/or diagnosis of SARS-CoV-2 by FDA under an Emergency Use Authorization (EUA). This EUA will remain  in effect (meaning this test can be used) for the duration of the COVID-19 declaration under Section 56 4(b)(1) of the Act, 21 U.S.C. section 360bbb-3(b)(1), unless the authorization is terminated or revoked sooner. Performed at [facility], [place] [address] [place], [place] [address]   Blood culture (routine x 2)     Status: None   Collection Time: 04/08/19 11:10 PM   Specimen: BLOOD LEFT HAND  Result Value Ref Range Status   Specimen Description BLOOD LEFT HAND  Final   Special Requests   Final    BOTTLES DRAWN AEROBIC AND ANAEROBIC Blood Culture adequate volume   Culture   Final    NO GROWTH 5 DAYS Performed at [facility], [place] [address] [place], [place] [address]    Report Status 04/13/2019 FINAL  Final  Blood culture (routine x 2)     Status: None   Collection Time: 04/08/19 11:10 PM   Specimen: BLOOD RIGHT ARM  Result Value Ref Range Status   Specimen Description BLOOD RIGHT ARM  Final   Special Requests   Final    BOTTLES DRAWN AEROBIC AND ANAEROBIC Blood Culture adequate volume   Culture   Final    NO GROWTH 5 DAYS Performed at [facility], [place] [address], [place], [place] [address]    Report Status 04/13/2019 FINAL  Final     Patient was seen and examined on the day of discharge and was found to be in stable condition. Time coordinating discharge: 33 minutes including assessment and coordination of care, as well as examination of the patient.   SIGNED:  Shelda Pal, DO [facility] 04/13/2019, 12:08 PM

## 2019-04-13 NOTE — Progress Notes (Signed)
ANTICOAGULATION CONSULT NOTE - Follow Up Consult  Pharmacy Consult for Coumadin Indication: atrial fibrillation  Allergies  Allergen Reactions  . Bee Venom Hives    Takes benadryl  . Other Other (See Comments)    Raw foods with seeds give her "boils".  Avoids raw strawberries, blueberries. Tolerates cooked fruits, tomato sauce, bread/grains with seeds. [facility] 10/17/13 Berries with seeds Allergy to glutamine-C-quercet-selen-brom per [facility] 09/25/17  . Zosyn [Piperacillin Sod-Tazobactam So] Rash    Itchy total body rash.  . Alteplase Rash  . Cefepime Rash  . Fentanyl Other (See Comments)    Hallucinations, syncope    . Metformin And Related Diarrhea  . Vancomycin Rash    Patient Measurements:  Vital Signs: Temp: 97.7 F (36.5 C) (02/10 0549) Temp Source: Oral (02/10 0549) BP: 108/60 (02/10 0549) Pulse Rate: 86 (02/10 0549)  Labs: Recent Labs    04/10/19 0857 04/10/19 0857 04/11/19 0217 04/11/19 0217 04/12/19 0209 04/13/19 0200  HGB 10.5*   < > 10.3*   < > 10.2* 9.8*  HCT 37.4   < > 36.8  --  36.0 34.6*  PLT 207   < > 244  --  249 244  LABPROT  --   --  30.4*  --  28.3* 24.8*  INR  --   --  2.9*  --  2.7* 2.3*  CREATININE 0.80  --   --   --  0.81 0.83   < > = values in this interval not displayed.    Estimated Creatinine Clearance: 89.7 mL/min (by C-G formula based on SCr of 0.83 mg/dL).   Assessment: AC: [place] PTA for afib. TA Coumadin 6-8mg /day variable per med rec. Admission INR 1.8. CHA2DS2-VASc Score 5. INR 2.3. Hgb 9.8 stable.   Goal of Therapy:  INR 2-3 Monitor platelets by anticoagulation protocol: Yes   Plan:  Coumadin 7.5mg  po x 1 tonight. Daily INR.  Check Magnesium in AM   Donna [name] S. Alford Highland, PharmD, BCPS Clinical Staff Pharmacist [email or web address] Alford Highland, [employer] 04/13/2019,8:30 AM

## 2019-04-14 NOTE — Consult Note (Signed)
   [facility] CM Inpatient Consult   04/14/2019  [place] Feb 07, 1951 [id number]    Patientwas screened for 32% extreme highrisk score forunplanned readmission, with less than 7 days and 30 day readmissions, 3hospitalizations and an ED visit in the past 6 months; and to check for potential needs of [facility]) care management servicesunder her Medicare/ [employer]Gen insuranceplan.  Chart review and MDbrief summary reveal as follows: Patient was discharged on 2/4 from [facility] for a sacral ulcer/osteomyelitis on Zosyn with a PICC line.  After she took her first dose at home, she broke out in a rash and sought care here on 2/5 and was readmitted. (Allergic urticaria) She was initially changed from Zosyn to ertapenem, but rash did not improve, so she was changed to oral ciprofloxacin. Patient will be kept on ciprofloxacin 750 mg twice daily until March 14.  Patient'sprimary care provider is O'Buch, Alvis Lemmings, [place] with [facility], listed to provide transition of care.  Transition of care CM note shows that patient is from home with husband; and has home health RN and PT with [facility]).  Attempts to talk to patient over the phone but unsuccessful.  Patient was discharged home yesterday prior to speakingtoher.  Patient will benefit from General EMMIcalls tofollow-up post discharge.    For questionsand referral,please call:   Edwena Felty A. Saharah Sherrow, BSN, RN-BC [facility] Liaison Cell: [phone]

## 2019-04-15 DIAGNOSIS — E1144 Type 2 diabetes mellitus with diabetic amyotrophy: Secondary | ICD-10-CM | POA: Diagnosis not present

## 2019-04-15 DIAGNOSIS — L89154 Pressure ulcer of sacral region, stage 4: Secondary | ICD-10-CM | POA: Diagnosis not present

## 2019-04-15 DIAGNOSIS — E876 Hypokalemia: Secondary | ICD-10-CM | POA: Diagnosis not present

## 2019-04-15 DIAGNOSIS — I4821 Permanent atrial fibrillation: Secondary | ICD-10-CM | POA: Diagnosis not present

## 2019-04-15 DIAGNOSIS — I5032 Chronic diastolic (congestive) heart failure: Secondary | ICD-10-CM | POA: Diagnosis not present

## 2019-04-15 DIAGNOSIS — I110 Hypertensive heart disease with heart failure: Secondary | ICD-10-CM | POA: Diagnosis not present

## 2019-04-18 DIAGNOSIS — Z7901 Long term (current) use of anticoagulants: Secondary | ICD-10-CM | POA: Diagnosis not present

## 2019-04-19 DIAGNOSIS — E1144 Type 2 diabetes mellitus with diabetic amyotrophy: Secondary | ICD-10-CM | POA: Diagnosis not present

## 2019-04-19 DIAGNOSIS — Z79899 Other long term (current) drug therapy: Secondary | ICD-10-CM | POA: Diagnosis not present

## 2019-04-19 DIAGNOSIS — I5032 Chronic diastolic (congestive) heart failure: Secondary | ICD-10-CM | POA: Diagnosis not present

## 2019-04-19 DIAGNOSIS — E1140 Type 2 diabetes mellitus with diabetic neuropathy, unspecified: Secondary | ICD-10-CM | POA: Diagnosis not present

## 2019-04-19 DIAGNOSIS — I110 Hypertensive heart disease with heart failure: Secondary | ICD-10-CM | POA: Diagnosis not present

## 2019-04-19 DIAGNOSIS — R32 Unspecified urinary incontinence: Secondary | ICD-10-CM | POA: Diagnosis not present

## 2019-04-19 DIAGNOSIS — I482 Chronic atrial fibrillation, unspecified: Secondary | ICD-10-CM | POA: Diagnosis not present

## 2019-04-19 DIAGNOSIS — E876 Hypokalemia: Secondary | ICD-10-CM | POA: Diagnosis not present

## 2019-04-19 DIAGNOSIS — I4821 Permanent atrial fibrillation: Secondary | ICD-10-CM | POA: Diagnosis not present

## 2019-04-19 DIAGNOSIS — L89154 Pressure ulcer of sacral region, stage 4: Secondary | ICD-10-CM | POA: Diagnosis not present

## 2019-04-19 DIAGNOSIS — L98499 Non-pressure chronic ulcer of skin of other sites with unspecified severity: Secondary | ICD-10-CM | POA: Diagnosis not present

## 2019-04-20 DIAGNOSIS — E1144 Type 2 diabetes mellitus with diabetic amyotrophy: Secondary | ICD-10-CM | POA: Diagnosis not present

## 2019-04-20 DIAGNOSIS — I110 Hypertensive heart disease with heart failure: Secondary | ICD-10-CM | POA: Diagnosis not present

## 2019-04-20 DIAGNOSIS — L89154 Pressure ulcer of sacral region, stage 4: Secondary | ICD-10-CM | POA: Diagnosis not present

## 2019-04-20 DIAGNOSIS — I5032 Chronic diastolic (congestive) heart failure: Secondary | ICD-10-CM | POA: Diagnosis not present

## 2019-04-20 DIAGNOSIS — E876 Hypokalemia: Secondary | ICD-10-CM | POA: Diagnosis not present

## 2019-04-20 DIAGNOSIS — I4821 Permanent atrial fibrillation: Secondary | ICD-10-CM | POA: Diagnosis not present

## 2019-04-22 ENCOUNTER — Encounter (HOSPITAL_BASED_OUTPATIENT_CLINIC_OR_DEPARTMENT_OTHER): Payer: Medicare Other | Admitting: Internal Medicine

## 2019-04-22 DIAGNOSIS — E1144 Type 2 diabetes mellitus with diabetic amyotrophy: Secondary | ICD-10-CM | POA: Diagnosis not present

## 2019-04-22 DIAGNOSIS — I110 Hypertensive heart disease with heart failure: Secondary | ICD-10-CM | POA: Diagnosis not present

## 2019-04-22 DIAGNOSIS — E876 Hypokalemia: Secondary | ICD-10-CM | POA: Diagnosis not present

## 2019-04-22 DIAGNOSIS — L89154 Pressure ulcer of sacral region, stage 4: Secondary | ICD-10-CM | POA: Diagnosis not present

## 2019-04-22 DIAGNOSIS — I4821 Permanent atrial fibrillation: Secondary | ICD-10-CM | POA: Diagnosis not present

## 2019-04-22 DIAGNOSIS — I5032 Chronic diastolic (congestive) heart failure: Secondary | ICD-10-CM | POA: Diagnosis not present

## 2019-04-25 DIAGNOSIS — E1140 Type 2 diabetes mellitus with diabetic neuropathy, unspecified: Secondary | ICD-10-CM | POA: Diagnosis not present

## 2019-04-25 DIAGNOSIS — E1169 Type 2 diabetes mellitus with other specified complication: Secondary | ICD-10-CM | POA: Diagnosis not present

## 2019-04-25 DIAGNOSIS — G40909 Epilepsy, unspecified, not intractable, without status epilepticus: Secondary | ICD-10-CM | POA: Diagnosis not present

## 2019-04-25 DIAGNOSIS — E1144 Type 2 diabetes mellitus with diabetic amyotrophy: Secondary | ICD-10-CM | POA: Diagnosis not present

## 2019-04-25 DIAGNOSIS — E1151 Type 2 diabetes mellitus with diabetic peripheral angiopathy without gangrene: Secondary | ICD-10-CM | POA: Diagnosis not present

## 2019-04-25 DIAGNOSIS — T368X5D Adverse effect of other systemic antibiotics, subsequent encounter: Secondary | ICD-10-CM | POA: Diagnosis not present

## 2019-04-25 DIAGNOSIS — R32 Unspecified urinary incontinence: Secondary | ICD-10-CM | POA: Diagnosis not present

## 2019-04-25 DIAGNOSIS — M545 Low back pain: Secondary | ICD-10-CM | POA: Diagnosis not present

## 2019-04-25 DIAGNOSIS — D649 Anemia, unspecified: Secondary | ICD-10-CM | POA: Diagnosis not present

## 2019-04-25 DIAGNOSIS — G4733 Obstructive sleep apnea (adult) (pediatric): Secondary | ICD-10-CM | POA: Diagnosis not present

## 2019-04-25 DIAGNOSIS — G8929 Other chronic pain: Secondary | ICD-10-CM | POA: Diagnosis not present

## 2019-04-25 DIAGNOSIS — F411 Generalized anxiety disorder: Secondary | ICD-10-CM | POA: Diagnosis not present

## 2019-04-25 DIAGNOSIS — I4821 Permanent atrial fibrillation: Secondary | ICD-10-CM | POA: Diagnosis not present

## 2019-04-25 DIAGNOSIS — Z6835 Body mass index (BMI) 35.0-35.9, adult: Secondary | ICD-10-CM | POA: Diagnosis not present

## 2019-04-25 DIAGNOSIS — M797 Fibromyalgia: Secondary | ICD-10-CM | POA: Diagnosis not present

## 2019-04-25 DIAGNOSIS — G2581 Restless legs syndrome: Secondary | ICD-10-CM | POA: Diagnosis not present

## 2019-04-25 DIAGNOSIS — G541 Lumbosacral plexus disorders: Secondary | ICD-10-CM | POA: Diagnosis not present

## 2019-04-25 DIAGNOSIS — I872 Venous insufficiency (chronic) (peripheral): Secondary | ICD-10-CM | POA: Diagnosis not present

## 2019-04-25 DIAGNOSIS — L500 Allergic urticaria: Secondary | ICD-10-CM | POA: Diagnosis not present

## 2019-04-25 DIAGNOSIS — I5032 Chronic diastolic (congestive) heart failure: Secondary | ICD-10-CM | POA: Diagnosis not present

## 2019-04-25 DIAGNOSIS — F329 Major depressive disorder, single episode, unspecified: Secondary | ICD-10-CM | POA: Diagnosis not present

## 2019-04-25 DIAGNOSIS — I110 Hypertensive heart disease with heart failure: Secondary | ICD-10-CM | POA: Diagnosis not present

## 2019-04-25 DIAGNOSIS — M4628 Osteomyelitis of vertebra, sacral and sacrococcygeal region: Secondary | ICD-10-CM | POA: Diagnosis not present

## 2019-04-25 DIAGNOSIS — L89154 Pressure ulcer of sacral region, stage 4: Secondary | ICD-10-CM | POA: Diagnosis not present

## 2019-04-26 DIAGNOSIS — E1169 Type 2 diabetes mellitus with other specified complication: Secondary | ICD-10-CM | POA: Diagnosis not present

## 2019-04-26 DIAGNOSIS — I4821 Permanent atrial fibrillation: Secondary | ICD-10-CM | POA: Diagnosis not present

## 2019-04-26 DIAGNOSIS — I110 Hypertensive heart disease with heart failure: Secondary | ICD-10-CM | POA: Diagnosis not present

## 2019-04-26 DIAGNOSIS — M4628 Osteomyelitis of vertebra, sacral and sacrococcygeal region: Secondary | ICD-10-CM | POA: Diagnosis not present

## 2019-04-26 DIAGNOSIS — I5032 Chronic diastolic (congestive) heart failure: Secondary | ICD-10-CM | POA: Diagnosis not present

## 2019-04-26 DIAGNOSIS — L89154 Pressure ulcer of sacral region, stage 4: Secondary | ICD-10-CM | POA: Diagnosis not present

## 2019-04-27 DIAGNOSIS — M4628 Osteomyelitis of vertebra, sacral and sacrococcygeal region: Secondary | ICD-10-CM | POA: Diagnosis not present

## 2019-04-27 DIAGNOSIS — Z791 Long term (current) use of non-steroidal anti-inflammatories (NSAID): Secondary | ICD-10-CM | POA: Diagnosis not present

## 2019-04-27 DIAGNOSIS — I5032 Chronic diastolic (congestive) heart failure: Secondary | ICD-10-CM | POA: Diagnosis not present

## 2019-04-27 DIAGNOSIS — L89154 Pressure ulcer of sacral region, stage 4: Secondary | ICD-10-CM | POA: Diagnosis not present

## 2019-04-27 DIAGNOSIS — I110 Hypertensive heart disease with heart failure: Secondary | ICD-10-CM | POA: Diagnosis not present

## 2019-04-27 DIAGNOSIS — I4821 Permanent atrial fibrillation: Secondary | ICD-10-CM | POA: Diagnosis not present

## 2019-04-27 DIAGNOSIS — E1169 Type 2 diabetes mellitus with other specified complication: Secondary | ICD-10-CM | POA: Diagnosis not present

## 2019-04-28 DIAGNOSIS — E1169 Type 2 diabetes mellitus with other specified complication: Secondary | ICD-10-CM | POA: Diagnosis not present

## 2019-04-28 DIAGNOSIS — I5032 Chronic diastolic (congestive) heart failure: Secondary | ICD-10-CM | POA: Diagnosis not present

## 2019-04-28 DIAGNOSIS — I110 Hypertensive heart disease with heart failure: Secondary | ICD-10-CM | POA: Diagnosis not present

## 2019-04-28 DIAGNOSIS — I4821 Permanent atrial fibrillation: Secondary | ICD-10-CM | POA: Diagnosis not present

## 2019-04-28 DIAGNOSIS — L89154 Pressure ulcer of sacral region, stage 4: Secondary | ICD-10-CM | POA: Diagnosis not present

## 2019-04-28 DIAGNOSIS — M4628 Osteomyelitis of vertebra, sacral and sacrococcygeal region: Secondary | ICD-10-CM | POA: Diagnosis not present

## 2019-05-02 DIAGNOSIS — I4821 Permanent atrial fibrillation: Secondary | ICD-10-CM | POA: Diagnosis not present

## 2019-05-02 DIAGNOSIS — I5032 Chronic diastolic (congestive) heart failure: Secondary | ICD-10-CM | POA: Diagnosis not present

## 2019-05-02 DIAGNOSIS — L89154 Pressure ulcer of sacral region, stage 4: Secondary | ICD-10-CM | POA: Diagnosis not present

## 2019-05-02 DIAGNOSIS — E1169 Type 2 diabetes mellitus with other specified complication: Secondary | ICD-10-CM | POA: Diagnosis not present

## 2019-05-02 DIAGNOSIS — M4628 Osteomyelitis of vertebra, sacral and sacrococcygeal region: Secondary | ICD-10-CM | POA: Diagnosis not present

## 2019-05-02 DIAGNOSIS — I110 Hypertensive heart disease with heart failure: Secondary | ICD-10-CM | POA: Diagnosis not present

## 2019-05-03 DIAGNOSIS — I5032 Chronic diastolic (congestive) heart failure: Secondary | ICD-10-CM | POA: Diagnosis not present

## 2019-05-03 DIAGNOSIS — I110 Hypertensive heart disease with heart failure: Secondary | ICD-10-CM | POA: Diagnosis not present

## 2019-05-03 DIAGNOSIS — L89154 Pressure ulcer of sacral region, stage 4: Secondary | ICD-10-CM | POA: Diagnosis not present

## 2019-05-03 DIAGNOSIS — I4821 Permanent atrial fibrillation: Secondary | ICD-10-CM | POA: Diagnosis not present

## 2019-05-03 DIAGNOSIS — M4628 Osteomyelitis of vertebra, sacral and sacrococcygeal region: Secondary | ICD-10-CM | POA: Diagnosis not present

## 2019-05-03 DIAGNOSIS — E1169 Type 2 diabetes mellitus with other specified complication: Secondary | ICD-10-CM | POA: Diagnosis not present

## 2019-05-04 ENCOUNTER — Ambulatory Visit: Payer: Medicare Other | Admitting: Infectious Disease

## 2019-05-04 DIAGNOSIS — E1169 Type 2 diabetes mellitus with other specified complication: Secondary | ICD-10-CM | POA: Diagnosis not present

## 2019-05-04 DIAGNOSIS — I5032 Chronic diastolic (congestive) heart failure: Secondary | ICD-10-CM | POA: Diagnosis not present

## 2019-05-04 DIAGNOSIS — M4628 Osteomyelitis of vertebra, sacral and sacrococcygeal region: Secondary | ICD-10-CM | POA: Diagnosis not present

## 2019-05-04 DIAGNOSIS — L89154 Pressure ulcer of sacral region, stage 4: Secondary | ICD-10-CM | POA: Diagnosis not present

## 2019-05-04 DIAGNOSIS — I110 Hypertensive heart disease with heart failure: Secondary | ICD-10-CM | POA: Diagnosis not present

## 2019-05-04 DIAGNOSIS — I4821 Permanent atrial fibrillation: Secondary | ICD-10-CM | POA: Diagnosis not present

## 2019-05-05 ENCOUNTER — Telehealth: Payer: Self-pay | Admitting: Cardiology

## 2019-05-05 DIAGNOSIS — E1169 Type 2 diabetes mellitus with other specified complication: Secondary | ICD-10-CM | POA: Diagnosis not present

## 2019-05-05 DIAGNOSIS — L89154 Pressure ulcer of sacral region, stage 4: Secondary | ICD-10-CM | POA: Diagnosis not present

## 2019-05-05 DIAGNOSIS — M4628 Osteomyelitis of vertebra, sacral and sacrococcygeal region: Secondary | ICD-10-CM | POA: Diagnosis not present

## 2019-05-05 DIAGNOSIS — I4821 Permanent atrial fibrillation: Secondary | ICD-10-CM | POA: Diagnosis not present

## 2019-05-05 DIAGNOSIS — I110 Hypertensive heart disease with heart failure: Secondary | ICD-10-CM | POA: Diagnosis not present

## 2019-05-05 DIAGNOSIS — I5032 Chronic diastolic (congestive) heart failure: Secondary | ICD-10-CM | POA: Diagnosis not present

## 2019-05-05 NOTE — Telephone Encounter (Signed)
*  STAT* If patient is at the pharmacy, call can be transferred to refill team.   1. Which medications need to be refilled? (please list name of each medication and dose if known) [facility]  2. Which pharmacy/location (including street and city if local pharmacy) is medication to be sent to? [facility]  3. Do they need a 30 day or 90 day supply?  10

## 2019-05-06 NOTE — Telephone Encounter (Signed)
Follow Up  Patient has been contacted and appointment has been scheduled. Please assist with refill.

## 2019-05-09 DIAGNOSIS — M4628 Osteomyelitis of vertebra, sacral and sacrococcygeal region: Secondary | ICD-10-CM | POA: Diagnosis not present

## 2019-05-09 DIAGNOSIS — I4821 Permanent atrial fibrillation: Secondary | ICD-10-CM | POA: Diagnosis not present

## 2019-05-09 DIAGNOSIS — E1169 Type 2 diabetes mellitus with other specified complication: Secondary | ICD-10-CM | POA: Diagnosis not present

## 2019-05-09 DIAGNOSIS — L89154 Pressure ulcer of sacral region, stage 4: Secondary | ICD-10-CM | POA: Diagnosis not present

## 2019-05-09 DIAGNOSIS — I110 Hypertensive heart disease with heart failure: Secondary | ICD-10-CM | POA: Diagnosis not present

## 2019-05-09 DIAGNOSIS — I5032 Chronic diastolic (congestive) heart failure: Secondary | ICD-10-CM | POA: Diagnosis not present

## 2019-05-09 NOTE — Telephone Encounter (Signed)
Per [facility] LOV 09-08-2018 note:Metolazone only on Sundays.  This is no longer on pt medication list. Pt has appt 05-11-2018 with Dr Ellyn Hack. Will await this appt for further refills.  [id number] if she needs a refill before her upcoming appt.

## 2019-05-11 ENCOUNTER — Encounter: Payer: Self-pay | Admitting: Cardiology

## 2019-05-11 ENCOUNTER — Telehealth (INDEPENDENT_AMBULATORY_CARE_PROVIDER_SITE_OTHER): Payer: Medicare Other | Admitting: Cardiology

## 2019-05-11 VITALS — HR 99 | Ht 71.0 in | Wt 253.0 lb

## 2019-05-11 DIAGNOSIS — E1169 Type 2 diabetes mellitus with other specified complication: Secondary | ICD-10-CM | POA: Diagnosis not present

## 2019-05-11 DIAGNOSIS — I5032 Chronic diastolic (congestive) heart failure: Secondary | ICD-10-CM | POA: Diagnosis not present

## 2019-05-11 DIAGNOSIS — I4821 Permanent atrial fibrillation: Secondary | ICD-10-CM

## 2019-05-11 DIAGNOSIS — I110 Hypertensive heart disease with heart failure: Secondary | ICD-10-CM | POA: Diagnosis not present

## 2019-05-11 DIAGNOSIS — I10 Essential (primary) hypertension: Secondary | ICD-10-CM

## 2019-05-11 DIAGNOSIS — I878 Other specified disorders of veins: Secondary | ICD-10-CM | POA: Diagnosis not present

## 2019-05-11 DIAGNOSIS — L89154 Pressure ulcer of sacral region, stage 4: Secondary | ICD-10-CM | POA: Diagnosis not present

## 2019-05-11 DIAGNOSIS — M4628 Osteomyelitis of vertebra, sacral and sacrococcygeal region: Secondary | ICD-10-CM | POA: Diagnosis not present

## 2019-05-11 MED ORDER — METOPROLOL SUCCINATE ER 25 MG PO TB24: 25.0000 mg | ORAL_TABLET | Freq: Every day | ORAL | 3 refills | Status: DC

## 2019-05-11 MED ORDER — METOLAZONE 5 MG PO TABS: 5.0000 mg | ORAL_TABLET | ORAL | 3 refills | Status: DC

## 2019-05-11 MED ORDER — POTASSIUM CHLORIDE CRYS ER 20 MEQ PO TBCR: 20.0000 meq | EXTENDED_RELEASE_TABLET | Freq: Every day | ORAL | 3 refills | Status: DC

## 2019-05-11 NOTE — Assessment & Plan Note (Signed)
Significant weight loss since her last in person visit.  BMI down from 41-35.  I congratulated her on her efforts.  I think the more she loses, at this trend she should have notable improvement in her joint pains as well as respiratory issues.

## 2019-05-11 NOTE — Assessment & Plan Note (Signed)
Chronic edema that seems to be pretty well controlled.  Still recommend using support stockings.  The increase activity walking etc. seems to be helping. For now continue current dose of Lasix which she can take additional doses as needed.  But also will use once weekly Zaroxolyn and as needed (5 mg dose).  Was not able to tolerate spironolactone because of dysuria associated with it. Needs to monitor her potassium levels with increased dose of Lasix.

## 2019-05-11 NOTE — Patient Instructions (Addendum)
Medication Instructions:   We will refill your metoprolol succinate and Zaroxolyn/metolazone that you use as needed.  Okay to take an additional 20 mg of furosemide or additional dose of Zaroxolyn 10 mg for worsening swelling.  Would just need to take additional potassium dose when you do that.  *If you need a refill on your cardiac medications before your next appointment, please call your pharmacy*   Lab Work:   None for now if being followed by PCP  If you have labs (blood work) drawn today and your tests are completely normal, you will receive your results only by: Marland Kitchen MyChart Message (if you have MyChart) OR . A paper copy in the mail If you have any lab test that is abnormal or we need to change your treatment, we will call you to review the results.   Testing/Procedures:  None   Follow-Up: At [facility], you and your health needs are our priority.  As part of our continuing mission to provide you with exceptional heart care, we have created designated Provider Care Teams.  These Care Teams include your primary Cardiologist (physician) and Advanced Practice Providers (APPs -  Physician Assistants and Nurse Practitioners) who all work together to provide you with the care you need, when you need it.  We recommend signing up for the patient portal called "MyChart".  Sign up information is provided on this After Visit Summary.  MyChart is used to connect with patients for Virtual Visits (Telemedicine).  Patients are able to view lab/test results, encounter notes, upcoming appointments, etc.  Non-urgent messages can be sent to your provider as well.   To learn more about what you can do with MyChart, go to [email or web address].    Your next appointment:   6 month(s) Sept  2021  The format for your next appointment:   In Person  Provider:   You may see Glenetta Hew, MD or one of the following Advanced Practice Providers on your designated Care Team:    Rosaria Ferries,  PA-C  Jory Sims, DNP, ANP  Cadence Kathlen Mody, NP    Other Instructions Keep working hard with weight loss.  You doing great

## 2019-05-11 NOTE — Assessment & Plan Note (Signed)
I probably would not consider this to be true diagnosis as she is only on low-dose Toprol for rate control.

## 2019-05-11 NOTE — Assessment & Plan Note (Signed)
Rate seems to be adequately controlled on current dose of Toprol.  On warfarin for anticoagulation.  She did well with amiodarone for rate control, but would prefer to avoid this long-term.

## 2019-05-11 NOTE — Assessment & Plan Note (Signed)
She really probably only has diastolic dysfunction that exacerbated by rapid A. fib.  With weight loss, this is improved quite a bit. She is on standing dose of Lasix which seems to be doing well.  We will restart her once weekly Zaroxolyn that she can also use as needed.  There is a question of dosing.  We will start with 5 mg dose of Zaroxolyn.

## 2019-05-11 NOTE — Progress Notes (Signed)
Virtual Visit via Telephone Note   This visit type was conducted due to national recommendations for restrictions regarding the COVID-19 Pandemic (e.g. social distancing) in an effort to limit this patient's exposure and mitigate transmission in our community.  Due to her co-morbid illnesses, this patient is at least at moderate risk for complications without adequate follow up.  This format is felt to be most appropriate for this patient at this time.  The patient did not have access to video technology/had technical difficulties with video requiring transitioning to audio format only (telephone).  All issues noted in this document were discussed and addressed.  No physical exam could be performed with this format.  Please refer to the patient's chart for her  consent to telehealth for [facility].   Patient has given verbal permission to conduct this visit via virtual appointment and to bill insurance 05/11/2019 5:44 PM     Evaluation Performed:  Follow-up visit  Date:  05/11/2019   ID:  Donna [name], DOB 08/05/1950, MRN [id number]  Patient Location: Home Provider Location: Other:  Hospital Office  PCP:  Janine Limbo, PA-C  Cardiologist:  Glenetta Hew, MD  Electrophysiologist:  None   Chief Complaint:  * Chief Complaint  Patient presents with  . 8 month follow up     was not able to take blodd pressure ----no chest pain , no shortness of breath, some ankle swwelling.  patient has stopped taking spiroloactone - she states she had a burning sensation  when urinatint when she took the medication. she also does not have Metalozone - medication expired --  taking cipro - she will be going to the doctor to check out her wound on Monday .  Marland Kitchen Atrial Fibrillation    Rates are usually controlled, unless anxious  . Edema    Stable to improved    History of Present Illness:    Donna [name] is a 69 y.o. female with PMH notable for chronic A. fib and venous stasis along with morbid obesity  who presents via audio/video conferencing for a telehealth visit today.  Donna [name] is now on her 9th Cardiologist --> has carried a Dx of "[place]" despite an essentially normal Cardiac Evaluation - relatively normal Echo & Cath.   She has chronic LE Edema - mostly related to venous stasis combined with a component of OSA/OHS (no PND or orthopnea going along with edema.   She does had Chronic / Persistent Atrial Fibrillation (rate controlled & on warfarin  She has been plaqued with Hypokalemia & Hypomagnesemia requiring significant supplementation.   She is wheelchair-bound with bilateral knee arthritis, but has had her right knee TKA and is now pending left TKA. (Right knee TKA [facility]) December 2018) -- unfortunately, he L knee TKA has been delayed (most recently related to surreptitious finding of Afib RVR  Donna [name] was last seen via telemedicine in June 2020 (last seen in person March 2020).  We have been trying to get her approved to have her knee operated on.  Her left knee was significantly hurting keeping her from being out do anything.  Unfortunately right about the time that she did not have this done, COVID-19 happened, and I do not think her surgery is yet been done.  Hospitalizations:  . ER visit November 27 for acute cystitis. Marland Kitchen Hospitalized January 30-February 4: Sepsis (sacral ulcer/osteomyelitis).  Discharged on Zosyn . Hospitalized February 5-10: Medication reaction-hypersensitivity with rash to Zosyn.  Changed to  Cipro.   Recent - Interim CV studies:   The following studies were reviewed today: . None:  Inerval History   Donna [name] is being seen today for routine follow-up, but also to discuss her recent hospitalizations.  She seems to have recovered relatively well from all that and has had no major issues.  She has been doing remarkably well leading up to the hospitalization and has been able to lose 35 pounds with increasing her physical activity  working with physical therapy and changing her diet.  She says that with this fit increase physical activity level, her knee has actually been much less painful and there potentially considering not having to have knee surgery.  She does get short of breath when she walks around, but was doing fairly well prior to her recent hospitalization and is now getting back up to her previous baseline.  No chest pain or pressure with rest or exertion.  No exertional dyspnea.  No PND or orthopnea.  Her edema is stable, and described as mild.  She apparently is out of her Zaroxolyn prescription that she was taking as needed.  About the only thing she notes is that she gets short of breath when she is doing more than usual physical activity, and her heart rate gets up really fast when she gets anxious.  Cardiovascular ROS: positive for - dyspnea on exertion, edema and Rapid heart rate response when anxious negative for - chest pain, irregular heartbeat, orthopnea, palpitations, paroxysmal nocturnal dyspnea, shortness of breath or Syncope/near syncope, TIA/amaurosis fugax, claudication Notable significant weight loss  ROS:  Please see the history of present illness.    The patient does not have symptoms concerning for COVID-19 infection (fever, chills, cough, or new shortness of breath).  Review of Systems  Constitutional: Positive for weight loss (Intentional).  HENT: Negative for congestion and nosebleeds.   Gastrointestinal: Negative for blood in stool and melena.  Genitourinary: Negative for hematuria.  Musculoskeletal: Positive for joint pain (Knee pain has notably improved).  Neurological: Positive for dizziness (If she stands up too fast).  Psychiatric/Behavioral: Negative for memory loss. The patient is not nervous/anxious.     The patient is practicing social distancing.  Past Medical History:  Diagnosis Date  . CHF (congestive heart failure) ([place])   . Chronic /permanent atrial fibrillation  ([place]) 2005   On Warfarin (follwed @ [facility]); Rate controlled - On BB  . Chronic diastolic heart failure, NYHA class 2 ([place])    Updated by A. fib, hypertensive heart disease and obesity; EDP was only 7 by cardiac catheterization in September 2017  . Diabetic lumbosacral plexopathy ([place])   . Fibromyalgia   . Generalized anxiety disorder   . Hypokalemia 09/03/2017  . Incontinence   . Major depressive disorder   . OSA on CPAP   . Restless leg syndrome   . Syncope 09/03/2017  . Type II diabetes mellitus ([place])     Past Surgical History:  Procedure Laterality Date  . CARDIAC CATHETERIZATION  11/09/2015   [facility]: Nonocclusive CAD. EF 55%. EDP 7 mmHg.  Marland Kitchen CHOLECYSTECTOMY OPEN    . JOINT REPLACEMENT    . KNEE ARTHROSCOPY Right   . MASS EXCISION Left    sarcoma - shoulder  . SHOULDER OPEN ROTATOR CUFF REPAIR Bilateral   . TONSILLECTOMY    . TOTAL HIP ARTHROPLASTY Right 2015  . TOTAL HIP REVISION Right 2015  . TOTAL KNEE ARTHROPLASTY Right   . TRANSTHORACIC ECHOCARDIOGRAM  11/2013; 02/2016   a) [facility]: with Definity:  EF 60-65%. Mod LA dilation. Mild AoV sclerosis. Normal RHP. Indeterminate LV filling pressures. b) [facility]: Normal cavity size. EF 55-60%.no RWMA, mild AoV sclerosis (R cusp), Mod MAC. Mild biAtrial dilation.  . TRANSTHORACIC ECHOCARDIOGRAM  08/2017    Mild LVH with normal EF 55-60%.  Unable to fully assess wall motion, but appear to be normal.  Unable to determine diastolic function because of A. fib.  Mild to moderate MAC with mild mitral stenosis (mean gradient 9 mmHg).  Moderate LA dilation.  CVP estimated 3 mmHg.  No obvious evidence of pulmonary hypertension.  Marland Kitchen VAGINAL HYSTERECTOMY       Current Meds  Medication Sig  . acetaminophen (TYLENOL) 500 MG tablet Take 1,000 mg by mouth 3 (three) times daily.   . ARIPiprazole (ABILIFY) 10 MG tablet Take 10 mg by mouth daily.  . busPIRone (BUSPAR) 10 MG tablet Take 10 mg by mouth 2  (two) times daily.   . ciprofloxacin (CIPRO) 750 MG tablet Take 1 tablet (750 mg total) by mouth 2 (two) times daily.  Marland Kitchen docusate sodium (COLACE) 100 MG capsule Take 100 mg by mouth daily as needed for mild constipation.  . Dulaglutide (TRULICITY) 1.5 UR/4.2HC SOPN Inject 1.5 mg into the skin every Saturday.   . ezetimibe (ZETIA) 10 MG tablet Take 10 mg by mouth at bedtime.   . ferrous sulfate 325 (65 FE) MG tablet Take 325 mg by mouth daily with breakfast.   . furosemide (LASIX) 20 MG tablet Take 1 tablet (20 mg total) by mouth daily.  Marland Kitchen gabapentin (NEURONTIN) 300 MG capsule Take 600 mg by mouth 3 (three) times daily.  . Insulin Glargine (BASAGLAR KWIKPEN) 100 UNIT/ML SOPN Inject 0.25 mLs (25 Units total) into the skin at bedtime.  . insulin regular (NOVOLIN R) 100 units/mL injection Inject 0-12 Units into the skin 3 (three) times daily before meals. 0-200 0 units 201-250 6 units 251-300 8 units 301-350 10 units 351-400 12 units Greater than 400 call MD  . lidocaine (LIDODERM) 5 % Place 1 patch onto the skin daily as needed (right hip pain). Remove & Discard patch within 12 hours or as directed by MD   . loperamide (IMODIUM A-D) 2 MG tablet Take 2 mg by mouth 4 (four) times daily as needed for diarrhea or loose stools.  Marland Kitchen loratadine (CLARITIN) 10 MG tablet Take 1 tablet (10 mg total) by mouth daily.  . magnesium oxide (MAG-OX) 400 MG tablet Take 1 tablet (400 mg total) by mouth 3 (three) times daily. ALSO TAKE A EXTRA 400 MG TABLET ON THE DAYS YOU TAKE ZAROXOLYN TABLET (Patient taking differently: Take 400 mg by mouth 3 (three) times daily. )  . metoprolol succinate (TOPROL-XL) 25 MG 24 hr tablet Take 1 tablet (25 mg total) by mouth daily.  Marland Kitchen omeprazole (PRILOSEC) 20 MG capsule Take 20 mg by mouth daily as needed (acid reflux/indigestion).   Marland Kitchen oxyCODONE (OXY IR/ROXICODONE) 5 MG immediate release tablet Take 1 tablet (5 mg total) by mouth every 6 (six) hours as needed for severe pain.  .  potassium chloride SA (KLOR-CON) 20 MEQ tablet Take 1 tablet (20 mEq total) by mouth daily. TAKE AN EXTRA 20 MEQ ON THE DAYS YOU TAKE ZAROXLYN  . pramipexole (MIRAPEX) 0.125 MG tablet Take 0.125 mg by mouth at bedtime.   . sertraline (ZOLOFT) 100 MG tablet Take 100 mg by mouth at bedtime.   Marland Kitchen warfarin (COUMADIN) 1 MG  tablet Take 1 mg by mouth See admin instructions. Take as directed by MD per weekly levels  . warfarin (JANTOVEN) 2 MG tablet Take 2 mg by mouth See admin instructions. Take as directed by MD per weekly levels  . warfarin (JANTOVEN) 3 MG tablet Take 3 mg by mouth See admin instructions. Take as directed by MD per weekly levels  . warfarin (JANTOVEN) 5 MG tablet Take 5 mg by mouth See admin instructions. Take as directed by MD per weekly levels  . [DISCONTINUED] metoprolol succinate (TOPROL-XL) 25 MG 24 hr tablet Take 1 tablet (25 mg total) by mouth daily.  . [DISCONTINUED] potassium chloride SA (KLOR-CON) 20 MEQ tablet Take 1 tablet (20 mEq total) by mouth daily. TAKE AN EXTRA 20 MEQ ON THE DAYS YOU TAKE ZAROXLYN   just finished Cipro  Allergies:   Bee venom, Other, Zosyn [piperacillin sod-tazobactam so], Alteplase, Cefepime, Fentanyl, Metformin and related, and Vancomycin   Social History   Tobacco Use  . Smoking status: Never Smoker  . Smokeless tobacco: Never Used  Substance Use Topics  . Alcohol use: No  . Drug use: No     Family Hx: The patient's family history includes Heart disease in her mother; Hypertension in her mother; Leukemia in her son.   Labs/Other Tests and Data Reviewed:    EKG:  No ECG reviewed.  Recent Labs: 11/15/2018: TSH 0.434 04/07/2019: Magnesium 1.6 04/09/2019: ALT 11 04/13/2019: BUN 14; Creatinine, Ser 0.83; Hemoglobin 9.8; Platelets 244; Potassium 3.8; Sodium 139   Recent Lipid Panel Lab Results  Component Value Date/Time   CHOL 103 09/04/2017 02:36 AM   TRIG 73 09/04/2017 02:36 AM   HDL 34 (L) 09/04/2017 02:36 AM   CHOLHDL 3.0 09/04/2017  02:36 AM   LDLCALC 54 09/04/2017 02:36 AM    Wt Readings from Last 3 Encounters:  05/11/19 253 lb (114.8 kg)  04/06/19 248 lb 7.3 oz (112.7 kg)  11/13/18 260 lb 5.8 oz (118.1 kg)   05/2018: 285 lb  Objective:    Vital Signs:  Pulse 99   Ht _0  (1.803 m)   Wt 253 lb (114.8 kg)   SpO2 94%   BMI 35.29 kg/m   VITAL SIGNS:  reviewed A&O x 3.  Normal Mood & Affect Non-labored respirations   ASSESSMENT & PLAN:    Problem List Items Addressed This Visit    Permanent atrial fibrillation [facility]): CHA2DS2-VASc Score 5; on Warfarin - Primary (Chronic)    Rate seems to be adequately controlled on current dose of Toprol.  On warfarin for anticoagulation.  She did well with amiodarone for rate control, but would prefer to avoid this long-term.      Relevant Medications   metoprolol succinate (TOPROL-XL) 25 MG 24 hr tablet   metolazone (ZAROXOLYN) 5 MG tablet   Chronic diastolic CHF (congestive heart failure) (HCC) (Chronic)    She really probably only has diastolic dysfunction that exacerbated by rapid A. fib.  With weight loss, this is improved quite a bit. She is on standing dose of Lasix which seems to be doing well.  We will restart her once weekly Zaroxolyn that she can also use as needed.  There is a question of dosing.  We will start with 5 mg dose of Zaroxolyn.      Relevant Medications   metoprolol succinate (TOPROL-XL) 25 MG 24 hr tablet   metolazone (ZAROXOLYN) 5 MG tablet   Venous stasis of both lower extremities (Chronic)    Chronic edema that  seems to be pretty well controlled.  Still recommend using support stockings.  The increase activity walking etc. seems to be helping. For now continue current dose of Lasix which she can take additional doses as needed.  But also will use once weekly Zaroxolyn and as needed (5 mg dose).  Was not able to tolerate spironolactone because of dysuria associated with it. Needs to monitor her potassium levels with increased dose of  Lasix.      Relevant Medications   metoprolol succinate (TOPROL-XL) 25 MG 24 hr tablet   metolazone (ZAROXOLYN) 5 MG tablet   Morbid obesity (HCC) (Chronic)    Significant weight loss since her last in person visit.  BMI down from 41-35.  I congratulated her on her efforts.  I think the more she loses, at this trend she should have notable improvement in her joint pains as well as respiratory issues.      Essential hypertension (Chronic)    I probably would not consider this to be true diagnosis as she is only on low-dose Toprol for rate control.      Relevant Medications   metoprolol succinate (TOPROL-XL) 25 MG 24 hr tablet   metolazone (ZAROXOLYN) 5 MG tablet      COVID-19 Education: The signs and symptoms of COVID-19 were discussed with the patient and how to seek care for testing (follow up with PCP or arrange E-visit).   The importance of social distancing was discussed today.  Time:   Today, I have spent 14 minutes with the patient with telehealth technology discussing the above problems.  Additional 4-minute spent with charting.   Medication Adjustments/Labs and Tests Ordered: Current medicines are reviewed at length with the patient today.  Concerns regarding medicines are outlined above.   Patient Instructions  Medication Instructions:   We will refill your metoprolol succinate and Zaroxolyn/metolazone that you use as needed.  Okay to take an additional 20 mg of furosemide or additional dose of Zaroxolyn 10 mg for worsening swelling.  Would just need to take additional potassium dose when you do that.  *If you need a refill on your cardiac medications before your next appointment, please call your pharmacy*   Lab Work:   None for now if being followed by PCP  If you have labs (blood work) drawn today and your tests are completely normal, you will receive your results only by: Marland Kitchen MyChart Message (if you have MyChart) OR . A paper copy in the mail If you have  any lab test that is abnormal or we need to change your treatment, we will call you to review the results.   Testing/Procedures:  None   Follow-Up: At [facility], you and your health needs are our priority.  As part of our continuing mission to provide you with exceptional heart care, we have created designated Provider Care Teams.  These Care Teams include your primary Cardiologist (physician) and Advanced Practice Providers (APPs -  Physician Assistants and Nurse Practitioners) who all work together to provide you with the care you need, when you need it.  We recommend signing up for the patient portal called "MyChart".  Sign up information is provided on this After Visit Summary.  MyChart is used to connect with patients for Virtual Visits (Telemedicine).  Patients are able to view lab/test results, encounter notes, upcoming appointments, etc.  Non-urgent messages can be sent to your provider as well.   To learn more about what you can do with MyChart, go to [email or web address].  Your next appointment:   6 month(s) Sept  2021  The format for your next appointment:   In Person  Provider:   You may see Glenetta Hew, MD or one of the following Advanced Practice Providers on your designated Care Team:    Rosaria Ferries, PA-C  Jory Sims, DNP, ANP  Cadence Kathlen Mody, NP    Other Instructions Keep working hard with weight loss.  You doing great    Signed, Glenetta Hew, MD  05/11/2019 5:44 PM    [place]

## 2019-05-12 DIAGNOSIS — I110 Hypertensive heart disease with heart failure: Secondary | ICD-10-CM | POA: Diagnosis not present

## 2019-05-12 DIAGNOSIS — I4821 Permanent atrial fibrillation: Secondary | ICD-10-CM | POA: Diagnosis not present

## 2019-05-12 DIAGNOSIS — I5032 Chronic diastolic (congestive) heart failure: Secondary | ICD-10-CM | POA: Diagnosis not present

## 2019-05-12 DIAGNOSIS — L89154 Pressure ulcer of sacral region, stage 4: Secondary | ICD-10-CM | POA: Diagnosis not present

## 2019-05-12 DIAGNOSIS — M4628 Osteomyelitis of vertebra, sacral and sacrococcygeal region: Secondary | ICD-10-CM | POA: Diagnosis not present

## 2019-05-12 DIAGNOSIS — E1169 Type 2 diabetes mellitus with other specified complication: Secondary | ICD-10-CM | POA: Diagnosis not present

## 2019-05-16 DIAGNOSIS — Z7901 Long term (current) use of anticoagulants: Secondary | ICD-10-CM | POA: Diagnosis not present

## 2019-05-18 DIAGNOSIS — I5032 Chronic diastolic (congestive) heart failure: Secondary | ICD-10-CM | POA: Diagnosis not present

## 2019-05-18 DIAGNOSIS — I110 Hypertensive heart disease with heart failure: Secondary | ICD-10-CM | POA: Diagnosis not present

## 2019-05-18 DIAGNOSIS — I4821 Permanent atrial fibrillation: Secondary | ICD-10-CM | POA: Diagnosis not present

## 2019-05-18 DIAGNOSIS — M4628 Osteomyelitis of vertebra, sacral and sacrococcygeal region: Secondary | ICD-10-CM | POA: Diagnosis not present

## 2019-05-18 DIAGNOSIS — L89154 Pressure ulcer of sacral region, stage 4: Secondary | ICD-10-CM | POA: Diagnosis not present

## 2019-05-18 DIAGNOSIS — E1169 Type 2 diabetes mellitus with other specified complication: Secondary | ICD-10-CM | POA: Diagnosis not present

## 2019-05-19 DIAGNOSIS — E1169 Type 2 diabetes mellitus with other specified complication: Secondary | ICD-10-CM | POA: Diagnosis not present

## 2019-05-19 DIAGNOSIS — L89154 Pressure ulcer of sacral region, stage 4: Secondary | ICD-10-CM | POA: Diagnosis not present

## 2019-05-19 DIAGNOSIS — I4821 Permanent atrial fibrillation: Secondary | ICD-10-CM | POA: Diagnosis not present

## 2019-05-19 DIAGNOSIS — I5032 Chronic diastolic (congestive) heart failure: Secondary | ICD-10-CM | POA: Diagnosis not present

## 2019-05-19 DIAGNOSIS — M4628 Osteomyelitis of vertebra, sacral and sacrococcygeal region: Secondary | ICD-10-CM | POA: Diagnosis not present

## 2019-05-19 DIAGNOSIS — I110 Hypertensive heart disease with heart failure: Secondary | ICD-10-CM | POA: Diagnosis not present

## 2019-05-24 DIAGNOSIS — I5032 Chronic diastolic (congestive) heart failure: Secondary | ICD-10-CM | POA: Diagnosis not present

## 2019-05-24 DIAGNOSIS — E1169 Type 2 diabetes mellitus with other specified complication: Secondary | ICD-10-CM | POA: Diagnosis not present

## 2019-05-24 DIAGNOSIS — M4628 Osteomyelitis of vertebra, sacral and sacrococcygeal region: Secondary | ICD-10-CM | POA: Diagnosis not present

## 2019-05-24 DIAGNOSIS — I110 Hypertensive heart disease with heart failure: Secondary | ICD-10-CM | POA: Diagnosis not present

## 2019-05-24 DIAGNOSIS — I4821 Permanent atrial fibrillation: Secondary | ICD-10-CM | POA: Diagnosis not present

## 2019-05-24 DIAGNOSIS — L89154 Pressure ulcer of sacral region, stage 4: Secondary | ICD-10-CM | POA: Diagnosis not present

## 2019-05-25 DIAGNOSIS — F329 Major depressive disorder, single episode, unspecified: Secondary | ICD-10-CM | POA: Diagnosis not present

## 2019-05-25 DIAGNOSIS — E1151 Type 2 diabetes mellitus with diabetic peripheral angiopathy without gangrene: Secondary | ICD-10-CM | POA: Diagnosis not present

## 2019-05-25 DIAGNOSIS — I110 Hypertensive heart disease with heart failure: Secondary | ICD-10-CM | POA: Diagnosis not present

## 2019-05-25 DIAGNOSIS — M4628 Osteomyelitis of vertebra, sacral and sacrococcygeal region: Secondary | ICD-10-CM | POA: Diagnosis not present

## 2019-05-25 DIAGNOSIS — T368X5D Adverse effect of other systemic antibiotics, subsequent encounter: Secondary | ICD-10-CM | POA: Diagnosis not present

## 2019-05-25 DIAGNOSIS — G8929 Other chronic pain: Secondary | ICD-10-CM | POA: Diagnosis not present

## 2019-05-25 DIAGNOSIS — G541 Lumbosacral plexus disorders: Secondary | ICD-10-CM | POA: Diagnosis not present

## 2019-05-25 DIAGNOSIS — I872 Venous insufficiency (chronic) (peripheral): Secondary | ICD-10-CM | POA: Diagnosis not present

## 2019-05-25 DIAGNOSIS — I4821 Permanent atrial fibrillation: Secondary | ICD-10-CM | POA: Diagnosis not present

## 2019-05-25 DIAGNOSIS — F411 Generalized anxiety disorder: Secondary | ICD-10-CM | POA: Diagnosis not present

## 2019-05-25 DIAGNOSIS — E1140 Type 2 diabetes mellitus with diabetic neuropathy, unspecified: Secondary | ICD-10-CM | POA: Diagnosis not present

## 2019-05-25 DIAGNOSIS — L500 Allergic urticaria: Secondary | ICD-10-CM | POA: Diagnosis not present

## 2019-05-25 DIAGNOSIS — R32 Unspecified urinary incontinence: Secondary | ICD-10-CM | POA: Diagnosis not present

## 2019-05-25 DIAGNOSIS — Z6835 Body mass index (BMI) 35.0-35.9, adult: Secondary | ICD-10-CM | POA: Diagnosis not present

## 2019-05-25 DIAGNOSIS — E1169 Type 2 diabetes mellitus with other specified complication: Secondary | ICD-10-CM | POA: Diagnosis not present

## 2019-05-25 DIAGNOSIS — L89154 Pressure ulcer of sacral region, stage 4: Secondary | ICD-10-CM | POA: Diagnosis not present

## 2019-05-25 DIAGNOSIS — I5032 Chronic diastolic (congestive) heart failure: Secondary | ICD-10-CM | POA: Diagnosis not present

## 2019-05-25 DIAGNOSIS — M545 Low back pain: Secondary | ICD-10-CM | POA: Diagnosis not present

## 2019-05-25 DIAGNOSIS — G4733 Obstructive sleep apnea (adult) (pediatric): Secondary | ICD-10-CM | POA: Diagnosis not present

## 2019-05-25 DIAGNOSIS — M797 Fibromyalgia: Secondary | ICD-10-CM | POA: Diagnosis not present

## 2019-05-25 DIAGNOSIS — E1144 Type 2 diabetes mellitus with diabetic amyotrophy: Secondary | ICD-10-CM | POA: Diagnosis not present

## 2019-05-25 DIAGNOSIS — D649 Anemia, unspecified: Secondary | ICD-10-CM | POA: Diagnosis not present

## 2019-05-25 DIAGNOSIS — G2581 Restless legs syndrome: Secondary | ICD-10-CM | POA: Diagnosis not present

## 2019-05-25 DIAGNOSIS — G40909 Epilepsy, unspecified, not intractable, without status epilepticus: Secondary | ICD-10-CM | POA: Diagnosis not present

## 2019-05-26 DIAGNOSIS — L89154 Pressure ulcer of sacral region, stage 4: Secondary | ICD-10-CM | POA: Diagnosis not present

## 2019-05-26 DIAGNOSIS — I110 Hypertensive heart disease with heart failure: Secondary | ICD-10-CM | POA: Diagnosis not present

## 2019-05-26 DIAGNOSIS — E1169 Type 2 diabetes mellitus with other specified complication: Secondary | ICD-10-CM | POA: Diagnosis not present

## 2019-05-26 DIAGNOSIS — I4821 Permanent atrial fibrillation: Secondary | ICD-10-CM | POA: Diagnosis not present

## 2019-05-26 DIAGNOSIS — M4628 Osteomyelitis of vertebra, sacral and sacrococcygeal region: Secondary | ICD-10-CM | POA: Diagnosis not present

## 2019-05-26 DIAGNOSIS — I5032 Chronic diastolic (congestive) heart failure: Secondary | ICD-10-CM | POA: Diagnosis not present

## 2019-05-30 IMAGING — CT CT HIP*R* W/O CM
3 of 4 series · 16 of 46 positions shown, 19 images · non-contrast
Comparison: Plain films right hip 09/05/2017. CT abdomen and pelvis
03/19/2015.

CLINICAL DATA: Right hip pain since a fall 09/03/2017. Initial
encounter.

EXAM:
CT OF THE RIGHT HIP WITHOUT CONTRAST
TECHNIQUE: Multidetector CT imaging of the right hip was performed according to
the standard protocol. Multiplanar CT image reconstructions were
also generated.

[Series 5: soft tissue · axial · 0.52mm/px · z∈[+754,+1006]mm · 13 of 139 slices shown, 16 images]
[im 9/139  soft-tissue]
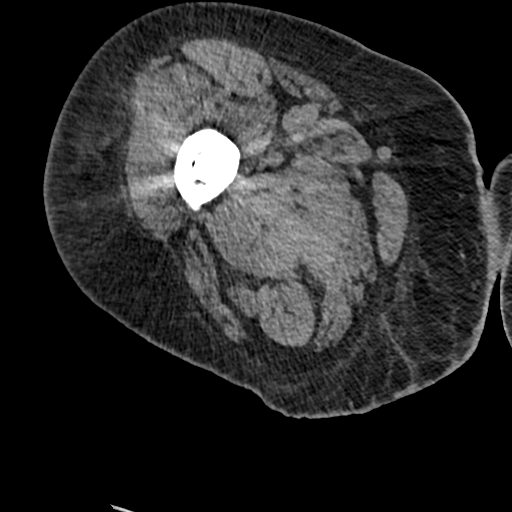
[im 9/139  bone]
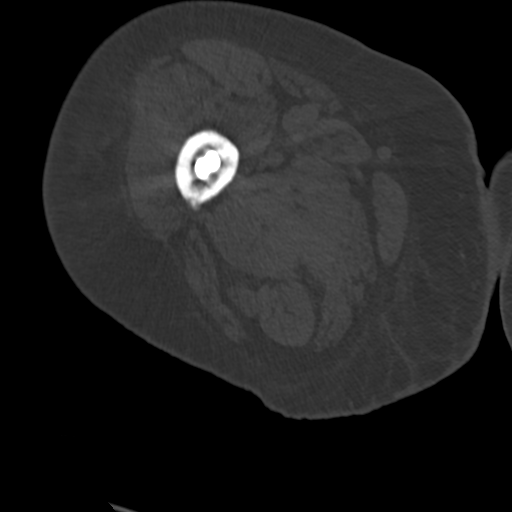
[im 23/139  soft-tissue]
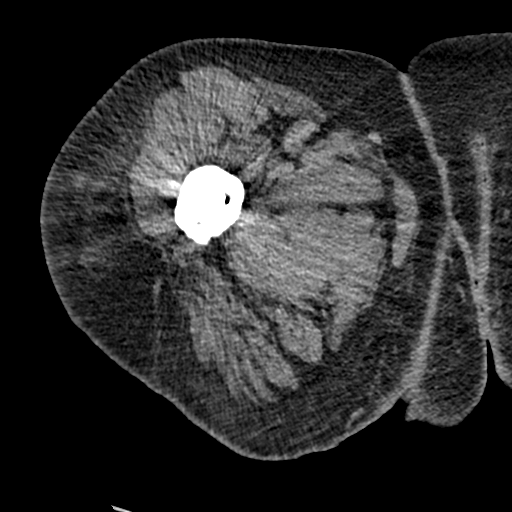
[im 36/139  soft-tissue]
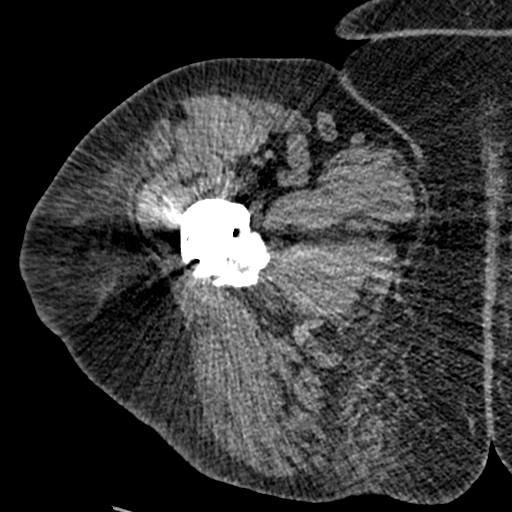
[im 49/139  soft-tissue]
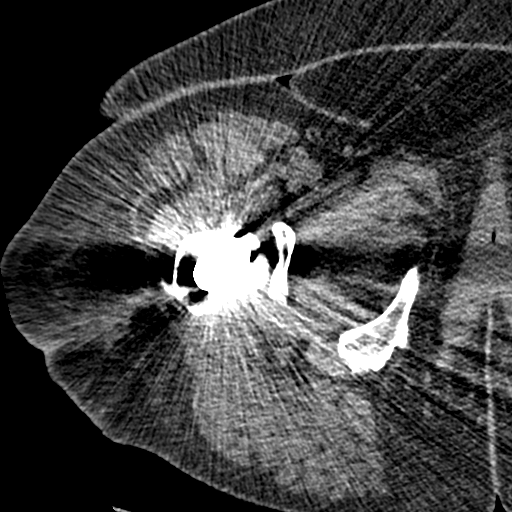
[im 63/139  soft-tissue]
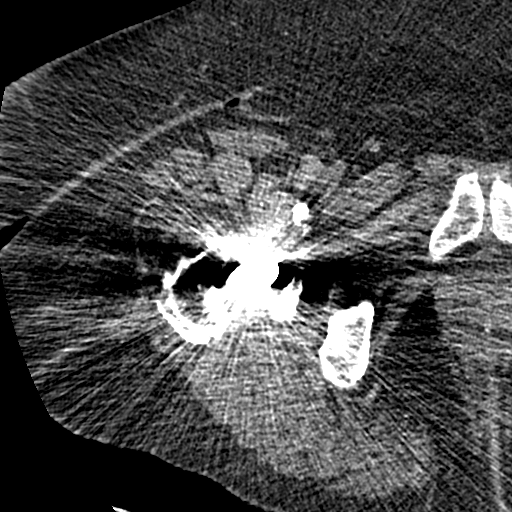
[im 76/139  soft-tissue]
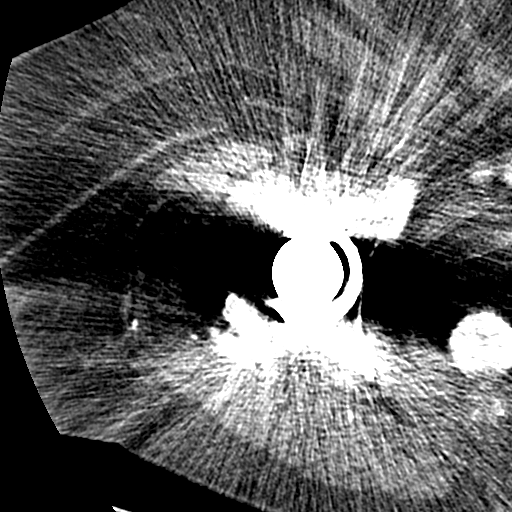
[im 90/139  soft-tissue]
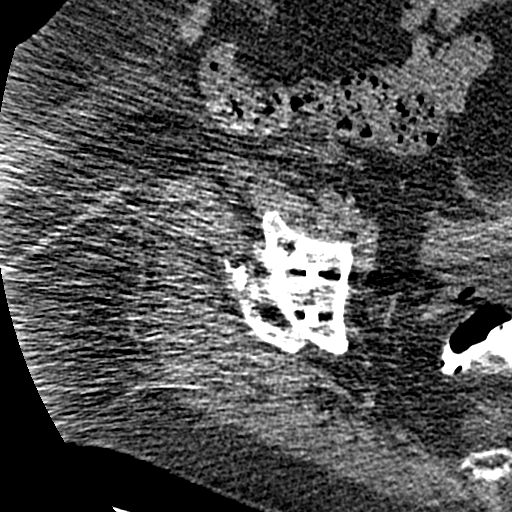
[im 103/139  soft-tissue]
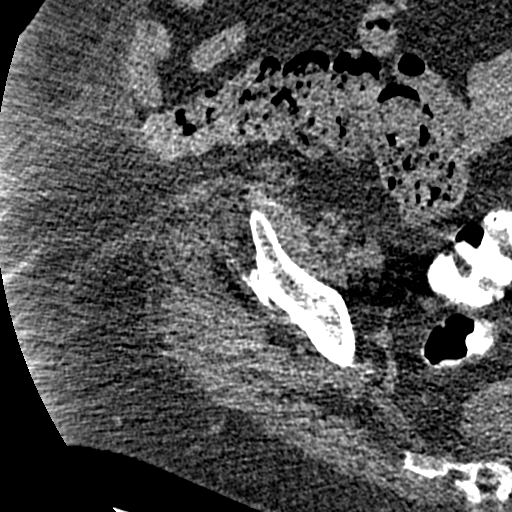
[im 116/139  soft-tissue]
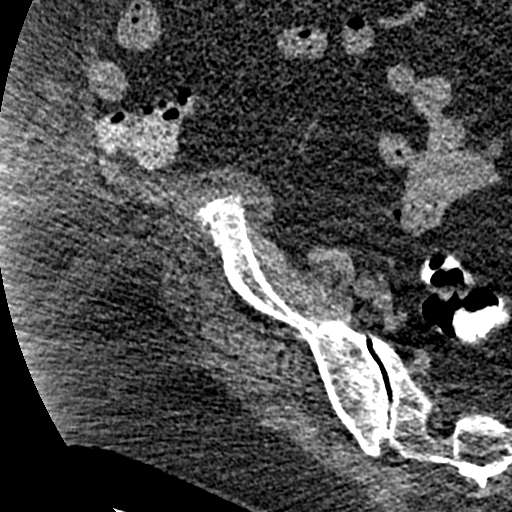
[im 116/139  bone]
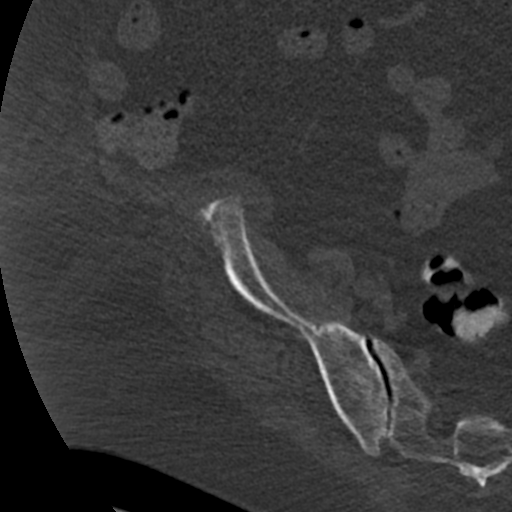
[im 121/139  lung]
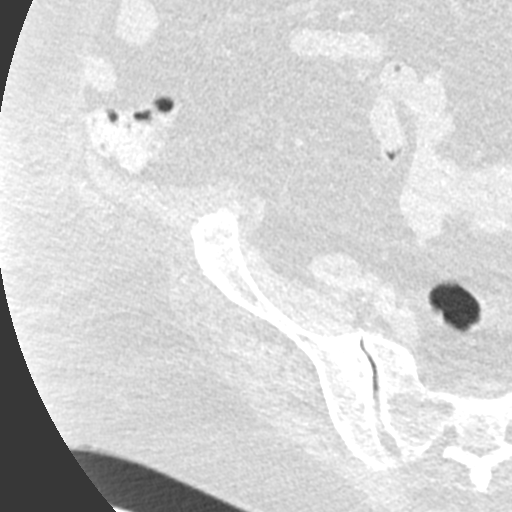
[im 125/139  lung]
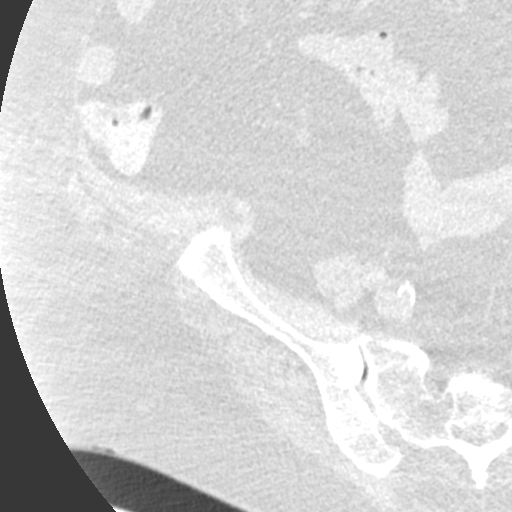
[im 130/139  soft-tissue]
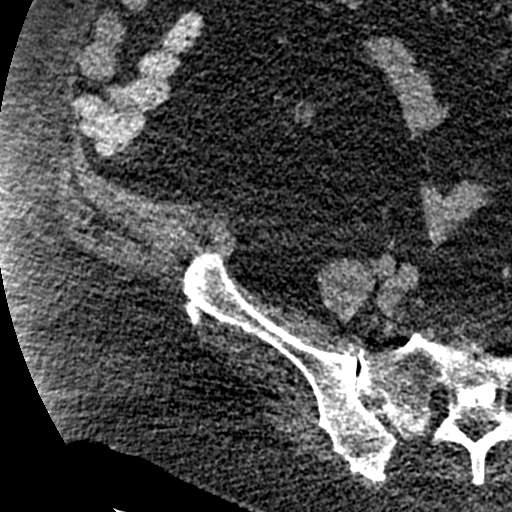
[im 130/139  lung]
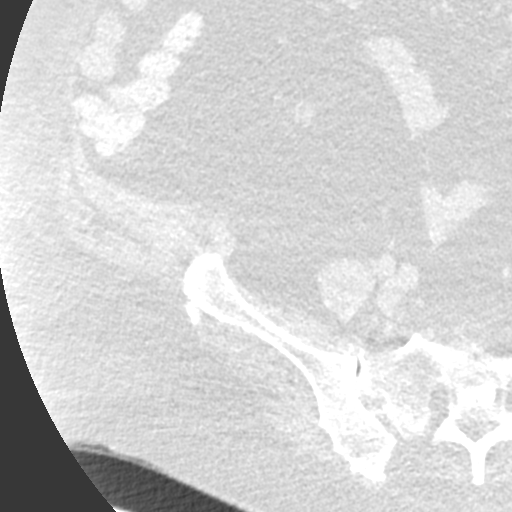
[im 134/139  lung]
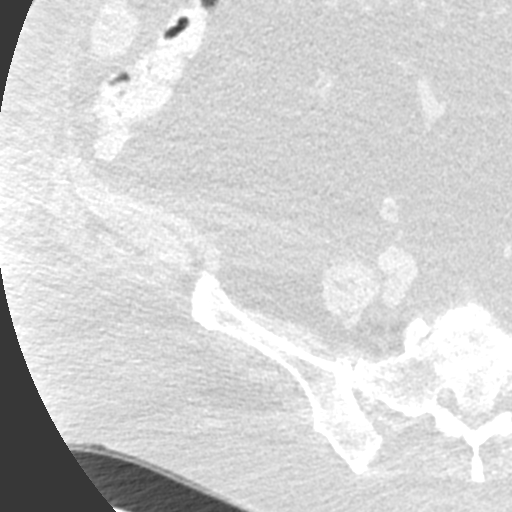

[Series 6: cor soft · coronal · 0.58mm/px · 2 of 91 slices shown]
[im 31/91  soft-tissue]
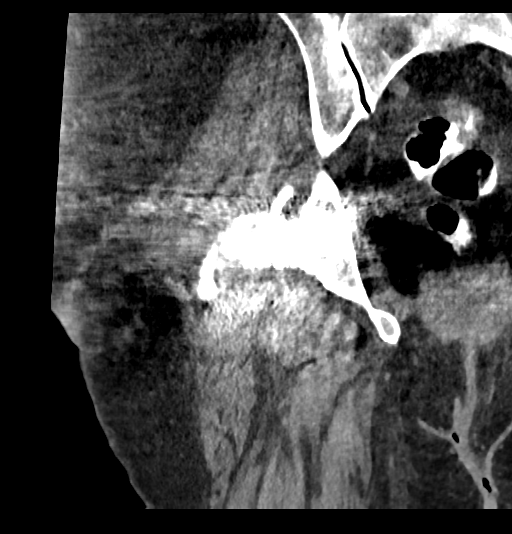
[im 61/91  soft-tissue]
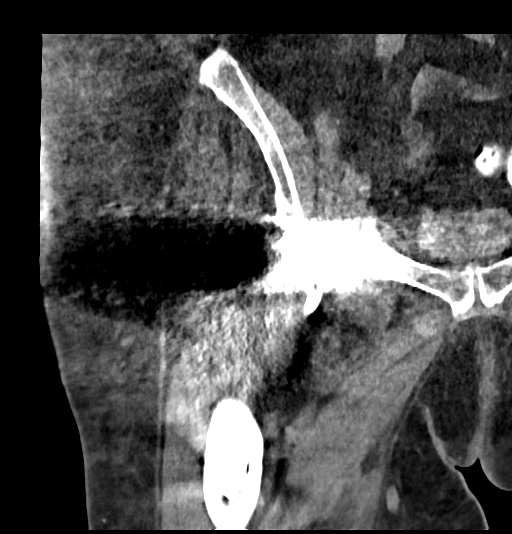

[Series 7: sag soft · sagittal · 0.57mm/px · 1 of 110 slices shown]
[im 38/110  soft-tissue]
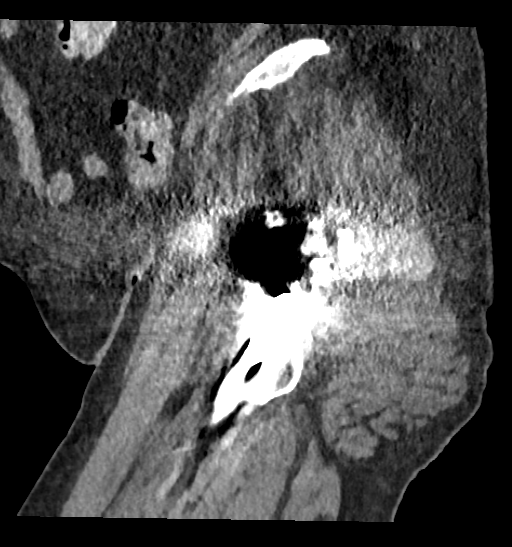

[16 of 46 positions shown; findings below may reference images not displayed]

FINDINGS: Bones/Joint/Cartilage

Right hip arthroplasty is in place. The device is located. No
fracture is identified. Heterotopic ossification is seen about the
head and neck of the hip and is unchanged since the prior CT. No
focal bone lesion is identified. Degenerative change is seen about
the right SI joint.

Ligaments

Suboptimally assessed by CT.

Muscles and Tendons

Appear intact. Chronic ossification of the iliopsoas tendon is
unchanged since the prior CT.

Soft tissues

Imaged intrapelvic contents demonstrate no acute abnormality.
Atherosclerosis is noted.
IMPRESSION: No acute abnormality.

Status post right hip replacement. Heterotopic ossification about
the device is chronic.

## 2019-05-31 DIAGNOSIS — I5032 Chronic diastolic (congestive) heart failure: Secondary | ICD-10-CM | POA: Diagnosis not present

## 2019-05-31 DIAGNOSIS — I110 Hypertensive heart disease with heart failure: Secondary | ICD-10-CM | POA: Diagnosis not present

## 2019-05-31 DIAGNOSIS — L89154 Pressure ulcer of sacral region, stage 4: Secondary | ICD-10-CM | POA: Diagnosis not present

## 2019-05-31 DIAGNOSIS — E1169 Type 2 diabetes mellitus with other specified complication: Secondary | ICD-10-CM | POA: Diagnosis not present

## 2019-05-31 DIAGNOSIS — M4628 Osteomyelitis of vertebra, sacral and sacrococcygeal region: Secondary | ICD-10-CM | POA: Diagnosis not present

## 2019-05-31 DIAGNOSIS — I4821 Permanent atrial fibrillation: Secondary | ICD-10-CM | POA: Diagnosis not present

## 2019-06-01 DIAGNOSIS — E1169 Type 2 diabetes mellitus with other specified complication: Secondary | ICD-10-CM | POA: Diagnosis not present

## 2019-06-01 DIAGNOSIS — I110 Hypertensive heart disease with heart failure: Secondary | ICD-10-CM | POA: Diagnosis not present

## 2019-06-01 DIAGNOSIS — M4628 Osteomyelitis of vertebra, sacral and sacrococcygeal region: Secondary | ICD-10-CM | POA: Diagnosis not present

## 2019-06-01 DIAGNOSIS — L89154 Pressure ulcer of sacral region, stage 4: Secondary | ICD-10-CM | POA: Diagnosis not present

## 2019-06-01 DIAGNOSIS — I4821 Permanent atrial fibrillation: Secondary | ICD-10-CM | POA: Diagnosis not present

## 2019-06-01 DIAGNOSIS — I5032 Chronic diastolic (congestive) heart failure: Secondary | ICD-10-CM | POA: Diagnosis not present

## 2019-06-08 DIAGNOSIS — I5033 Acute on chronic diastolic (congestive) heart failure: Secondary | ICD-10-CM | POA: Diagnosis not present

## 2019-06-08 DIAGNOSIS — E1169 Type 2 diabetes mellitus with other specified complication: Secondary | ICD-10-CM | POA: Diagnosis not present

## 2019-06-08 DIAGNOSIS — L89153 Pressure ulcer of sacral region, stage 3: Secondary | ICD-10-CM | POA: Diagnosis not present

## 2019-06-09 DIAGNOSIS — L89154 Pressure ulcer of sacral region, stage 4: Secondary | ICD-10-CM | POA: Diagnosis not present

## 2019-06-09 DIAGNOSIS — I110 Hypertensive heart disease with heart failure: Secondary | ICD-10-CM | POA: Diagnosis not present

## 2019-06-09 DIAGNOSIS — E1169 Type 2 diabetes mellitus with other specified complication: Secondary | ICD-10-CM | POA: Diagnosis not present

## 2019-06-09 DIAGNOSIS — M4628 Osteomyelitis of vertebra, sacral and sacrococcygeal region: Secondary | ICD-10-CM | POA: Diagnosis not present

## 2019-06-09 DIAGNOSIS — I4821 Permanent atrial fibrillation: Secondary | ICD-10-CM | POA: Diagnosis not present

## 2019-06-09 DIAGNOSIS — I5032 Chronic diastolic (congestive) heart failure: Secondary | ICD-10-CM | POA: Diagnosis not present

## 2019-06-13 DIAGNOSIS — Z7901 Long term (current) use of anticoagulants: Secondary | ICD-10-CM | POA: Diagnosis not present

## 2019-06-14 DIAGNOSIS — E1169 Type 2 diabetes mellitus with other specified complication: Secondary | ICD-10-CM | POA: Diagnosis not present

## 2019-06-14 DIAGNOSIS — L89154 Pressure ulcer of sacral region, stage 4: Secondary | ICD-10-CM | POA: Diagnosis not present

## 2019-06-14 DIAGNOSIS — I4821 Permanent atrial fibrillation: Secondary | ICD-10-CM | POA: Diagnosis not present

## 2019-06-14 DIAGNOSIS — I5032 Chronic diastolic (congestive) heart failure: Secondary | ICD-10-CM | POA: Diagnosis not present

## 2019-06-14 DIAGNOSIS — I110 Hypertensive heart disease with heart failure: Secondary | ICD-10-CM | POA: Diagnosis not present

## 2019-06-14 DIAGNOSIS — M4628 Osteomyelitis of vertebra, sacral and sacrococcygeal region: Secondary | ICD-10-CM | POA: Diagnosis not present

## 2019-06-17 DIAGNOSIS — I4821 Permanent atrial fibrillation: Secondary | ICD-10-CM | POA: Diagnosis not present

## 2019-06-17 DIAGNOSIS — L89154 Pressure ulcer of sacral region, stage 4: Secondary | ICD-10-CM | POA: Diagnosis not present

## 2019-06-17 DIAGNOSIS — E1169 Type 2 diabetes mellitus with other specified complication: Secondary | ICD-10-CM | POA: Diagnosis not present

## 2019-06-17 DIAGNOSIS — M4628 Osteomyelitis of vertebra, sacral and sacrococcygeal region: Secondary | ICD-10-CM | POA: Diagnosis not present

## 2019-06-17 DIAGNOSIS — I110 Hypertensive heart disease with heart failure: Secondary | ICD-10-CM | POA: Diagnosis not present

## 2019-06-17 DIAGNOSIS — I5032 Chronic diastolic (congestive) heart failure: Secondary | ICD-10-CM | POA: Diagnosis not present

## 2019-06-21 DIAGNOSIS — I4821 Permanent atrial fibrillation: Secondary | ICD-10-CM | POA: Diagnosis not present

## 2019-06-21 DIAGNOSIS — M4628 Osteomyelitis of vertebra, sacral and sacrococcygeal region: Secondary | ICD-10-CM | POA: Diagnosis not present

## 2019-06-21 DIAGNOSIS — I110 Hypertensive heart disease with heart failure: Secondary | ICD-10-CM | POA: Diagnosis not present

## 2019-06-21 DIAGNOSIS — L89154 Pressure ulcer of sacral region, stage 4: Secondary | ICD-10-CM | POA: Diagnosis not present

## 2019-06-21 DIAGNOSIS — I5032 Chronic diastolic (congestive) heart failure: Secondary | ICD-10-CM | POA: Diagnosis not present

## 2019-06-21 DIAGNOSIS — E1169 Type 2 diabetes mellitus with other specified complication: Secondary | ICD-10-CM | POA: Diagnosis not present

## 2019-06-22 DIAGNOSIS — L89154 Pressure ulcer of sacral region, stage 4: Secondary | ICD-10-CM | POA: Diagnosis not present

## 2019-06-22 DIAGNOSIS — I110 Hypertensive heart disease with heart failure: Secondary | ICD-10-CM | POA: Diagnosis not present

## 2019-06-22 DIAGNOSIS — E1169 Type 2 diabetes mellitus with other specified complication: Secondary | ICD-10-CM | POA: Diagnosis not present

## 2019-06-22 DIAGNOSIS — I4821 Permanent atrial fibrillation: Secondary | ICD-10-CM | POA: Diagnosis not present

## 2019-06-22 DIAGNOSIS — M4628 Osteomyelitis of vertebra, sacral and sacrococcygeal region: Secondary | ICD-10-CM | POA: Diagnosis not present

## 2019-06-22 DIAGNOSIS — I5032 Chronic diastolic (congestive) heart failure: Secondary | ICD-10-CM | POA: Diagnosis not present

## 2019-06-24 DIAGNOSIS — E1151 Type 2 diabetes mellitus with diabetic peripheral angiopathy without gangrene: Secondary | ICD-10-CM | POA: Diagnosis not present

## 2019-06-24 DIAGNOSIS — D649 Anemia, unspecified: Secondary | ICD-10-CM | POA: Diagnosis not present

## 2019-06-24 DIAGNOSIS — G40909 Epilepsy, unspecified, not intractable, without status epilepticus: Secondary | ICD-10-CM | POA: Diagnosis not present

## 2019-06-24 DIAGNOSIS — G2581 Restless legs syndrome: Secondary | ICD-10-CM | POA: Diagnosis not present

## 2019-06-24 DIAGNOSIS — E1140 Type 2 diabetes mellitus with diabetic neuropathy, unspecified: Secondary | ICD-10-CM | POA: Diagnosis not present

## 2019-06-24 DIAGNOSIS — I110 Hypertensive heart disease with heart failure: Secondary | ICD-10-CM | POA: Diagnosis not present

## 2019-06-24 DIAGNOSIS — I5032 Chronic diastolic (congestive) heart failure: Secondary | ICD-10-CM | POA: Diagnosis not present

## 2019-06-24 DIAGNOSIS — Z6835 Body mass index (BMI) 35.0-35.9, adult: Secondary | ICD-10-CM | POA: Diagnosis not present

## 2019-06-24 DIAGNOSIS — E1169 Type 2 diabetes mellitus with other specified complication: Secondary | ICD-10-CM | POA: Diagnosis not present

## 2019-06-24 DIAGNOSIS — I872 Venous insufficiency (chronic) (peripheral): Secondary | ICD-10-CM | POA: Diagnosis not present

## 2019-06-24 DIAGNOSIS — G4733 Obstructive sleep apnea (adult) (pediatric): Secondary | ICD-10-CM | POA: Diagnosis not present

## 2019-06-24 DIAGNOSIS — E1144 Type 2 diabetes mellitus with diabetic amyotrophy: Secondary | ICD-10-CM | POA: Diagnosis not present

## 2019-06-24 DIAGNOSIS — M797 Fibromyalgia: Secondary | ICD-10-CM | POA: Diagnosis not present

## 2019-06-24 DIAGNOSIS — G8929 Other chronic pain: Secondary | ICD-10-CM | POA: Diagnosis not present

## 2019-06-24 DIAGNOSIS — I4821 Permanent atrial fibrillation: Secondary | ICD-10-CM | POA: Diagnosis not present

## 2019-06-24 DIAGNOSIS — T368X5D Adverse effect of other systemic antibiotics, subsequent encounter: Secondary | ICD-10-CM | POA: Diagnosis not present

## 2019-06-24 DIAGNOSIS — M4628 Osteomyelitis of vertebra, sacral and sacrococcygeal region: Secondary | ICD-10-CM | POA: Diagnosis not present

## 2019-06-24 DIAGNOSIS — F329 Major depressive disorder, single episode, unspecified: Secondary | ICD-10-CM | POA: Diagnosis not present

## 2019-06-24 DIAGNOSIS — R32 Unspecified urinary incontinence: Secondary | ICD-10-CM | POA: Diagnosis not present

## 2019-06-24 DIAGNOSIS — L89154 Pressure ulcer of sacral region, stage 4: Secondary | ICD-10-CM | POA: Diagnosis not present

## 2019-06-24 DIAGNOSIS — G541 Lumbosacral plexus disorders: Secondary | ICD-10-CM | POA: Diagnosis not present

## 2019-06-24 DIAGNOSIS — M545 Low back pain: Secondary | ICD-10-CM | POA: Diagnosis not present

## 2019-06-24 DIAGNOSIS — F411 Generalized anxiety disorder: Secondary | ICD-10-CM | POA: Diagnosis not present

## 2019-06-24 DIAGNOSIS — L500 Allergic urticaria: Secondary | ICD-10-CM | POA: Diagnosis not present

## 2019-06-28 DIAGNOSIS — L89154 Pressure ulcer of sacral region, stage 4: Secondary | ICD-10-CM | POA: Diagnosis not present

## 2019-06-28 DIAGNOSIS — I4821 Permanent atrial fibrillation: Secondary | ICD-10-CM | POA: Diagnosis not present

## 2019-06-28 DIAGNOSIS — M4628 Osteomyelitis of vertebra, sacral and sacrococcygeal region: Secondary | ICD-10-CM | POA: Diagnosis not present

## 2019-06-28 DIAGNOSIS — E1169 Type 2 diabetes mellitus with other specified complication: Secondary | ICD-10-CM | POA: Diagnosis not present

## 2019-06-28 DIAGNOSIS — I110 Hypertensive heart disease with heart failure: Secondary | ICD-10-CM | POA: Diagnosis not present

## 2019-06-28 DIAGNOSIS — I5032 Chronic diastolic (congestive) heart failure: Secondary | ICD-10-CM | POA: Diagnosis not present

## 2019-06-30 DIAGNOSIS — I4821 Permanent atrial fibrillation: Secondary | ICD-10-CM | POA: Diagnosis not present

## 2019-06-30 DIAGNOSIS — I5032 Chronic diastolic (congestive) heart failure: Secondary | ICD-10-CM | POA: Diagnosis not present

## 2019-06-30 DIAGNOSIS — L89154 Pressure ulcer of sacral region, stage 4: Secondary | ICD-10-CM | POA: Diagnosis not present

## 2019-06-30 DIAGNOSIS — E1169 Type 2 diabetes mellitus with other specified complication: Secondary | ICD-10-CM | POA: Diagnosis not present

## 2019-06-30 DIAGNOSIS — M4628 Osteomyelitis of vertebra, sacral and sacrococcygeal region: Secondary | ICD-10-CM | POA: Diagnosis not present

## 2019-06-30 DIAGNOSIS — I110 Hypertensive heart disease with heart failure: Secondary | ICD-10-CM | POA: Diagnosis not present

## 2019-07-06 DIAGNOSIS — E1169 Type 2 diabetes mellitus with other specified complication: Secondary | ICD-10-CM | POA: Diagnosis not present

## 2019-07-06 DIAGNOSIS — L89154 Pressure ulcer of sacral region, stage 4: Secondary | ICD-10-CM | POA: Diagnosis not present

## 2019-07-06 DIAGNOSIS — M4628 Osteomyelitis of vertebra, sacral and sacrococcygeal region: Secondary | ICD-10-CM | POA: Diagnosis not present

## 2019-07-06 DIAGNOSIS — I5032 Chronic diastolic (congestive) heart failure: Secondary | ICD-10-CM | POA: Diagnosis not present

## 2019-07-06 DIAGNOSIS — I4821 Permanent atrial fibrillation: Secondary | ICD-10-CM | POA: Diagnosis not present

## 2019-07-06 DIAGNOSIS — I110 Hypertensive heart disease with heart failure: Secondary | ICD-10-CM | POA: Diagnosis not present

## 2019-07-08 DIAGNOSIS — Z0000 Encounter for general adult medical examination without abnormal findings: Secondary | ICD-10-CM | POA: Diagnosis not present

## 2019-07-08 DIAGNOSIS — Z9181 History of falling: Secondary | ICD-10-CM | POA: Diagnosis not present

## 2019-07-08 DIAGNOSIS — Z1331 Encounter for screening for depression: Secondary | ICD-10-CM | POA: Diagnosis not present

## 2019-07-08 DIAGNOSIS — E785 Hyperlipidemia, unspecified: Secondary | ICD-10-CM | POA: Diagnosis not present

## 2019-07-08 DIAGNOSIS — Z6836 Body mass index (BMI) 36.0-36.9, adult: Secondary | ICD-10-CM | POA: Diagnosis not present

## 2019-07-11 DIAGNOSIS — Z7901 Long term (current) use of anticoagulants: Secondary | ICD-10-CM | POA: Diagnosis not present

## 2019-07-13 DIAGNOSIS — I5032 Chronic diastolic (congestive) heart failure: Secondary | ICD-10-CM | POA: Diagnosis not present

## 2019-07-13 DIAGNOSIS — I4821 Permanent atrial fibrillation: Secondary | ICD-10-CM | POA: Diagnosis not present

## 2019-07-13 DIAGNOSIS — L89154 Pressure ulcer of sacral region, stage 4: Secondary | ICD-10-CM | POA: Diagnosis not present

## 2019-07-13 DIAGNOSIS — M4628 Osteomyelitis of vertebra, sacral and sacrococcygeal region: Secondary | ICD-10-CM | POA: Diagnosis not present

## 2019-07-13 DIAGNOSIS — I110 Hypertensive heart disease with heart failure: Secondary | ICD-10-CM | POA: Diagnosis not present

## 2019-07-13 DIAGNOSIS — E1169 Type 2 diabetes mellitus with other specified complication: Secondary | ICD-10-CM | POA: Diagnosis not present

## 2019-07-20 DIAGNOSIS — E1169 Type 2 diabetes mellitus with other specified complication: Secondary | ICD-10-CM | POA: Diagnosis not present

## 2019-07-20 DIAGNOSIS — M4628 Osteomyelitis of vertebra, sacral and sacrococcygeal region: Secondary | ICD-10-CM | POA: Diagnosis not present

## 2019-07-20 DIAGNOSIS — I4821 Permanent atrial fibrillation: Secondary | ICD-10-CM | POA: Diagnosis not present

## 2019-07-20 DIAGNOSIS — I5032 Chronic diastolic (congestive) heart failure: Secondary | ICD-10-CM | POA: Diagnosis not present

## 2019-07-20 DIAGNOSIS — L89154 Pressure ulcer of sacral region, stage 4: Secondary | ICD-10-CM | POA: Diagnosis not present

## 2019-07-20 DIAGNOSIS — I110 Hypertensive heart disease with heart failure: Secondary | ICD-10-CM | POA: Diagnosis not present

## 2019-07-21 DIAGNOSIS — L89154 Pressure ulcer of sacral region, stage 4: Secondary | ICD-10-CM | POA: Diagnosis not present

## 2019-07-21 DIAGNOSIS — E1169 Type 2 diabetes mellitus with other specified complication: Secondary | ICD-10-CM | POA: Diagnosis not present

## 2019-07-21 DIAGNOSIS — I5032 Chronic diastolic (congestive) heart failure: Secondary | ICD-10-CM | POA: Diagnosis not present

## 2019-07-21 DIAGNOSIS — M4628 Osteomyelitis of vertebra, sacral and sacrococcygeal region: Secondary | ICD-10-CM | POA: Diagnosis not present

## 2019-07-21 DIAGNOSIS — I4821 Permanent atrial fibrillation: Secondary | ICD-10-CM | POA: Diagnosis not present

## 2019-07-21 DIAGNOSIS — I110 Hypertensive heart disease with heart failure: Secondary | ICD-10-CM | POA: Diagnosis not present

## 2019-07-24 DIAGNOSIS — E1169 Type 2 diabetes mellitus with other specified complication: Secondary | ICD-10-CM | POA: Diagnosis not present

## 2019-07-24 DIAGNOSIS — R32 Unspecified urinary incontinence: Secondary | ICD-10-CM | POA: Diagnosis not present

## 2019-07-24 DIAGNOSIS — Z6835 Body mass index (BMI) 35.0-35.9, adult: Secondary | ICD-10-CM | POA: Diagnosis not present

## 2019-07-24 DIAGNOSIS — G8929 Other chronic pain: Secondary | ICD-10-CM | POA: Diagnosis not present

## 2019-07-24 DIAGNOSIS — I5032 Chronic diastolic (congestive) heart failure: Secondary | ICD-10-CM | POA: Diagnosis not present

## 2019-07-24 DIAGNOSIS — I110 Hypertensive heart disease with heart failure: Secondary | ICD-10-CM | POA: Diagnosis not present

## 2019-07-24 DIAGNOSIS — M545 Low back pain: Secondary | ICD-10-CM | POA: Diagnosis not present

## 2019-07-24 DIAGNOSIS — F329 Major depressive disorder, single episode, unspecified: Secondary | ICD-10-CM | POA: Diagnosis not present

## 2019-07-24 DIAGNOSIS — L89154 Pressure ulcer of sacral region, stage 4: Secondary | ICD-10-CM | POA: Diagnosis not present

## 2019-07-24 DIAGNOSIS — E1151 Type 2 diabetes mellitus with diabetic peripheral angiopathy without gangrene: Secondary | ICD-10-CM | POA: Diagnosis not present

## 2019-07-24 DIAGNOSIS — T368X5D Adverse effect of other systemic antibiotics, subsequent encounter: Secondary | ICD-10-CM | POA: Diagnosis not present

## 2019-07-24 DIAGNOSIS — E1144 Type 2 diabetes mellitus with diabetic amyotrophy: Secondary | ICD-10-CM | POA: Diagnosis not present

## 2019-07-24 DIAGNOSIS — F411 Generalized anxiety disorder: Secondary | ICD-10-CM | POA: Diagnosis not present

## 2019-07-24 DIAGNOSIS — E1140 Type 2 diabetes mellitus with diabetic neuropathy, unspecified: Secondary | ICD-10-CM | POA: Diagnosis not present

## 2019-07-24 DIAGNOSIS — M4628 Osteomyelitis of vertebra, sacral and sacrococcygeal region: Secondary | ICD-10-CM | POA: Diagnosis not present

## 2019-07-24 DIAGNOSIS — G40909 Epilepsy, unspecified, not intractable, without status epilepticus: Secondary | ICD-10-CM | POA: Diagnosis not present

## 2019-07-24 DIAGNOSIS — G2581 Restless legs syndrome: Secondary | ICD-10-CM | POA: Diagnosis not present

## 2019-07-24 DIAGNOSIS — I4821 Permanent atrial fibrillation: Secondary | ICD-10-CM | POA: Diagnosis not present

## 2019-07-24 DIAGNOSIS — G4733 Obstructive sleep apnea (adult) (pediatric): Secondary | ICD-10-CM | POA: Diagnosis not present

## 2019-07-24 DIAGNOSIS — M797 Fibromyalgia: Secondary | ICD-10-CM | POA: Diagnosis not present

## 2019-07-24 DIAGNOSIS — D649 Anemia, unspecified: Secondary | ICD-10-CM | POA: Diagnosis not present

## 2019-07-24 DIAGNOSIS — L500 Allergic urticaria: Secondary | ICD-10-CM | POA: Diagnosis not present

## 2019-07-24 DIAGNOSIS — I872 Venous insufficiency (chronic) (peripheral): Secondary | ICD-10-CM | POA: Diagnosis not present

## 2019-07-24 DIAGNOSIS — G541 Lumbosacral plexus disorders: Secondary | ICD-10-CM | POA: Diagnosis not present

## 2019-07-25 DIAGNOSIS — F339 Major depressive disorder, recurrent, unspecified: Secondary | ICD-10-CM | POA: Diagnosis not present

## 2019-07-25 DIAGNOSIS — M545 Low back pain: Secondary | ICD-10-CM | POA: Diagnosis not present

## 2019-07-25 DIAGNOSIS — G8929 Other chronic pain: Secondary | ICD-10-CM | POA: Diagnosis not present

## 2019-07-25 DIAGNOSIS — G629 Polyneuropathy, unspecified: Secondary | ICD-10-CM | POA: Diagnosis not present

## 2019-07-27 DIAGNOSIS — L89154 Pressure ulcer of sacral region, stage 4: Secondary | ICD-10-CM | POA: Diagnosis not present

## 2019-07-27 DIAGNOSIS — M4628 Osteomyelitis of vertebra, sacral and sacrococcygeal region: Secondary | ICD-10-CM | POA: Diagnosis not present

## 2019-07-27 DIAGNOSIS — I5032 Chronic diastolic (congestive) heart failure: Secondary | ICD-10-CM | POA: Diagnosis not present

## 2019-07-27 DIAGNOSIS — I110 Hypertensive heart disease with heart failure: Secondary | ICD-10-CM | POA: Diagnosis not present

## 2019-07-27 DIAGNOSIS — I4821 Permanent atrial fibrillation: Secondary | ICD-10-CM | POA: Diagnosis not present

## 2019-07-27 DIAGNOSIS — E1169 Type 2 diabetes mellitus with other specified complication: Secondary | ICD-10-CM | POA: Diagnosis not present

## 2019-08-03 DIAGNOSIS — M4628 Osteomyelitis of vertebra, sacral and sacrococcygeal region: Secondary | ICD-10-CM | POA: Diagnosis not present

## 2019-08-03 DIAGNOSIS — I4821 Permanent atrial fibrillation: Secondary | ICD-10-CM | POA: Diagnosis not present

## 2019-08-03 DIAGNOSIS — L89154 Pressure ulcer of sacral region, stage 4: Secondary | ICD-10-CM | POA: Diagnosis not present

## 2019-08-03 DIAGNOSIS — E1169 Type 2 diabetes mellitus with other specified complication: Secondary | ICD-10-CM | POA: Diagnosis not present

## 2019-08-03 DIAGNOSIS — I5032 Chronic diastolic (congestive) heart failure: Secondary | ICD-10-CM | POA: Diagnosis not present

## 2019-08-03 DIAGNOSIS — I110 Hypertensive heart disease with heart failure: Secondary | ICD-10-CM | POA: Diagnosis not present

## 2019-08-08 DIAGNOSIS — Z7901 Long term (current) use of anticoagulants: Secondary | ICD-10-CM | POA: Diagnosis not present

## 2019-08-09 ENCOUNTER — Telehealth: Payer: Medicare Other | Admitting: Adult Health

## 2019-08-09 DIAGNOSIS — M4628 Osteomyelitis of vertebra, sacral and sacrococcygeal region: Secondary | ICD-10-CM | POA: Diagnosis not present

## 2019-08-09 DIAGNOSIS — I110 Hypertensive heart disease with heart failure: Secondary | ICD-10-CM | POA: Diagnosis not present

## 2019-08-09 DIAGNOSIS — E1169 Type 2 diabetes mellitus with other specified complication: Secondary | ICD-10-CM | POA: Diagnosis not present

## 2019-08-09 DIAGNOSIS — I4821 Permanent atrial fibrillation: Secondary | ICD-10-CM | POA: Diagnosis not present

## 2019-08-09 DIAGNOSIS — L89154 Pressure ulcer of sacral region, stage 4: Secondary | ICD-10-CM | POA: Diagnosis not present

## 2019-08-09 DIAGNOSIS — I5032 Chronic diastolic (congestive) heart failure: Secondary | ICD-10-CM | POA: Diagnosis not present

## 2019-08-10 DIAGNOSIS — L89154 Pressure ulcer of sacral region, stage 4: Secondary | ICD-10-CM | POA: Diagnosis not present

## 2019-08-10 DIAGNOSIS — M4628 Osteomyelitis of vertebra, sacral and sacrococcygeal region: Secondary | ICD-10-CM | POA: Diagnosis not present

## 2019-08-10 DIAGNOSIS — I5032 Chronic diastolic (congestive) heart failure: Secondary | ICD-10-CM | POA: Diagnosis not present

## 2019-08-10 DIAGNOSIS — I110 Hypertensive heart disease with heart failure: Secondary | ICD-10-CM | POA: Diagnosis not present

## 2019-08-10 DIAGNOSIS — E1169 Type 2 diabetes mellitus with other specified complication: Secondary | ICD-10-CM | POA: Diagnosis not present

## 2019-08-10 DIAGNOSIS — I4821 Permanent atrial fibrillation: Secondary | ICD-10-CM | POA: Diagnosis not present

## 2019-08-17 NOTE — Progress Notes (Signed)
Virtual Visit via Telephone Note   This visit type was conducted due to national recommendations for restrictions regarding the COVID-19 Pandemic (e.g. social distancing) in an effort to limit this patient's exposure and mitigate transmission in our community.  Due to her co-morbid illnesses, this patient is at least at moderate risk for complications without adequate follow up.  This format is felt to be most appropriate for this patient at this time.  The patient did not have access to video technology/had technical difficulties with video requiring transitioning to audio format only (telephone).  All issues noted in this document were discussed and addressed.  No physical exam could be performed with this format.  Please refer to the patient's chart for her  consent to telehealth for [facility].   Date:  08/18/2019   ID:  Donna [name], DOB 1950/10/16, MRN [id number]  Patient Location: Home Provider Location: Office  PCP:  Janine Limbo, PA-C  Cardiologist:  Glenetta Hew, MD  Electrophysiologist:  None   Evaluation Performed:  Follow-Up Visit  Chief Complaint:  Follow up  History of Present Illness:    Donna [name] is a 69 y.o. female who presents for ongoing assessment and management of chronic atrial fibrillation, venous stasis, with chronic lower extremity edema, and morbid obesity. She has diabetes which is followed by her PCP.  She was last seen by Dr. Ellyn Hack on May 11, 2019 after recent hospitalization for osteomyelitis.  She was asymptomatic concerning cardiac issues of chest pain or dyspnea on exertion no back pain jaw pain or neck pain.  She was medically compliant.  At that time no medication changes were made.  She was given refills on metolazone 5 mg daily.  She was to use this as needed lower extremity edema.  She was advised on support stockings.  Dr. Ellyn Hack also noted that she had lost significant amount of weight with a BMI down from 41-35.  She was to continue this  lifestyle improvement with ongoing weight loss.  She uses a walker to get around for stability. Continues to lose weight. She continues to have anxiety about falling and getting around. She she was taken off of some medications for her mental health- stopped Xanax, Buspirone, and Effexor. Coumadin is managed by PCP.   She denies racing HR or palpitations. No bleeding issues on coumadin. She requests refills on metoprolol, lasix and potassium.  The patient does not have symptoms concerning for COVID-19 infection (fever, chills, cough, or new shortness of breath).    Past Medical History:  Diagnosis Date  . CHF (congestive heart failure) ([place])   . Chronic /permanent atrial fibrillation ([place]) 2005   On Warfarin (follwed @ [facility]); Rate controlled - On BB  . Chronic diastolic heart failure, NYHA class 2 ([place])    Updated by A. fib, hypertensive heart disease and obesity; EDP was only 7 by cardiac catheterization in September 2017  . Diabetic lumbosacral plexopathy ([place])   . Fibromyalgia   . Generalized anxiety disorder   . Hypokalemia 09/03/2017  . Incontinence   . Major depressive disorder   . OSA on CPAP   . Restless leg syndrome   . Syncope 09/03/2017  . Type II diabetes mellitus ([place])    Past Surgical History:  Procedure Laterality Date  . CARDIAC CATHETERIZATION  11/09/2015   [facility]: Nonocclusive CAD. EF 55%. EDP 7 mmHg.  Marland Kitchen CHOLECYSTECTOMY OPEN    . JOINT REPLACEMENT    . KNEE ARTHROSCOPY Right   .  MASS EXCISION Left    sarcoma - shoulder  . SHOULDER OPEN ROTATOR CUFF REPAIR Bilateral   . TONSILLECTOMY    . TOTAL HIP ARTHROPLASTY Right 2015  . TOTAL HIP REVISION Right 2015  . TOTAL KNEE ARTHROPLASTY Right   . TRANSTHORACIC ECHOCARDIOGRAM  11/2013; 02/2016   a) [facility]: with Definity:  EF 60-65%. Mod LA dilation. Mild AoV sclerosis. Normal RHP. Indeterminate LV filling pressures. b) [facility]: Normal cavity size. EF 55-60%.no RWMA,  mild AoV sclerosis (R cusp), Mod MAC. Mild biAtrial dilation.  . TRANSTHORACIC ECHOCARDIOGRAM  08/2017    Mild LVH with normal EF 55-60%.  Unable to fully assess wall motion, but appear to be normal.  Unable to determine diastolic function because of A. fib.  Mild to moderate MAC with mild mitral stenosis (mean gradient 9 mmHg).  Moderate LA dilation.  CVP estimated 3 mmHg.  No obvious evidence of pulmonary hypertension.  Marland Kitchen VAGINAL HYSTERECTOMY       Current Meds  Medication Sig  . acetaminophen (TYLENOL) 500 MG tablet Take 1,000 mg by mouth 3 (three) times daily.   . ARIPiprazole (ABILIFY) 10 MG tablet Take 10 mg by mouth daily.  Marland Kitchen docusate sodium (COLACE) 100 MG capsule Take 100 mg by mouth daily as needed for mild constipation.  . Dulaglutide (TRULICITY) 1.5 AL/9.3XT SOPN Inject 1.5 mg into the skin every Saturday.   . ezetimibe (ZETIA) 10 MG tablet Take 10 mg by mouth at bedtime.   . ferrous sulfate 325 (65 FE) MG tablet Take 325 mg by mouth daily with breakfast.   . furosemide (LASIX) 20 MG tablet Take 1 tablet (20 mg total) by mouth daily.  Marland Kitchen gabapentin (NEURONTIN) 300 MG capsule Take 600 mg by mouth 3 (three) times daily.  . Insulin Glargine (BASAGLAR KWIKPEN) 100 UNIT/ML SOPN Inject 0.25 mLs (25 Units total) into the skin at bedtime.  . insulin regular (NOVOLIN R) 100 units/mL injection Inject 0-12 Units into the skin 3 (three) times daily before meals. 0-200 0 units 201-250 6 units 251-300 8 units 301-350 10 units 351-400 12 units Greater than 400 call MD  . lidocaine (LIDODERM) 5 % Place 1 patch onto the skin daily as needed (right hip pain). Remove & Discard patch within 12 hours or as directed by MD   . loperamide (IMODIUM A-D) 2 MG tablet Take 2 mg by mouth 4 (four) times daily as needed for diarrhea or loose stools.  Marland Kitchen loratadine (CLARITIN) 10 MG tablet Take 1 tablet (10 mg total) by mouth daily.  . magnesium oxide (MAG-OX) 400 MG tablet Take 1 tablet (400 mg total) by mouth 3  (three) times daily. ALSO TAKE A EXTRA 400 MG TABLET ON THE DAYS YOU TAKE ZAROXOLYN TABLET (Patient taking differently: Take 400 mg by mouth 3 (three) times daily. )  . metoprolol succinate (TOPROL-XL) 25 MG 24 hr tablet Take 1 tablet (25 mg total) by mouth daily.  Marland Kitchen omeprazole (PRILOSEC) 20 MG capsule Take 20 mg by mouth daily as needed (acid reflux/indigestion).   Marland Kitchen oxyCODONE (OXY IR/ROXICODONE) 5 MG immediate release tablet Take 1 tablet (5 mg total) by mouth every 6 (six) hours as needed for severe pain.  . potassium chloride SA (KLOR-CON) 20 MEQ tablet Take 1 tablet (20 mEq total) by mouth daily. TAKE AN EXTRA 20 MEQ ON THE DAYS YOU TAKE ZAROXLYN  . pramipexole (MIRAPEX) 0.125 MG tablet Take 0.125 mg by mouth at bedtime.   . sertraline (ZOLOFT) 100 MG tablet Take  100 mg by mouth at bedtime.   Marland Kitchen warfarin (COUMADIN) 1 MG tablet Take 1 mg by mouth See admin instructions. Take as directed by MD per weekly levels  . warfarin (JANTOVEN) 2 MG tablet Take 2 mg by mouth See admin instructions. Take as directed by MD per weekly levels  . warfarin (JANTOVEN) 5 MG tablet Take 5 mg by mouth See admin instructions. Take as directed by MD per weekly levels     Allergies:   Bee venom, Other, Zosyn [piperacillin sod-tazobactam so], Alteplase, Cefepime, Fentanyl, Metformin and related, and Vancomycin   Social History   Tobacco Use  . Smoking status: Never Smoker  . Smokeless tobacco: Never Used  Vaping Use  . Vaping Use: Never used  Substance Use Topics  . Alcohol use: No  . Drug use: No     Family Hx: The patient's family history includes Heart disease in her mother; Hypertension in her mother; Leukemia in her son.  ROS:   Please see the history of present illness.    All other systems reviewed and are negative.   Prior CV studies:   The following studies were reviewed today:  Echocardiogram 09/03/2017 Left ventricle: The cavity size was normal. Wall thickness was  increased in a pattern  of mild LVH. Systolic function was normal.  The estimated ejection fraction was in the range of 55% to 60%.  Images were inadequate for LV wall motion assessment.  Indeterminate diastolic function.  - Aortic valve: Mildly to moderately calcified annulus. Trileaflet;  mildly calcified leaflets. There was very mild stenosis. Mean  gradient (S): 9 mm Hg. Peak gradient (S): 16 mm Hg. VTI ratio of  LVOT to aortic valve: 0.48. Valve area (VTI): 1.83 cm^2. Valve  area (Vmax): 1.76 cm^2. Valve area (Vmean): 2 cm^2.  - Mitral valve: Moderately calcified annulus. There was trivial  regurgitation.  - Left atrium: The atrium was moderately dilated.  - Right atrium: Central venous pressure (est): 3 mm Hg.  - Atrial septum: No defect or patent foramen ovale was identified.  - Tricuspid valve: There was trivial regurgitation.  - Pulmonary arteries: Systolic pressure could not be accurately  estimated.  - Pericardium, extracardiac: There was no pericardial effusion.   Labs/Other Tests and Data Reviewed:    EKG:  No ECG reviewed.  Recent Labs: 11/15/2018: TSH 0.434 04/07/2019: Magnesium 1.6 04/09/2019: ALT 11 04/13/2019: BUN 14; Creatinine, Ser 0.83; Hemoglobin 9.8; Platelets 244; Potassium 3.8; Sodium 139   Recent Lipid Panel Lab Results  Component Value Date/Time   CHOL 103 09/04/2017 02:36 AM   TRIG 73 09/04/2017 02:36 AM   HDL 34 (L) 09/04/2017 02:36 AM   CHOLHDL 3.0 09/04/2017 02:36 AM   LDLCALC 54 09/04/2017 02:36 AM    Wt Readings from Last 3 Encounters:  08/18/19 253 lb (114.8 kg)  05/11/19 253 lb (114.8 kg)  04/06/19 248 lb 7.3 oz (112.7 kg)     Objective:    Vital Signs:  BP 110/77 Comment: taken on 08/11/2019  Pulse 75   Ht _0  (1.778 m)   Wt 253 lb (114.8 kg)   SpO2 96%   BMI 36.30 kg/m  (Limited assessment due to telephone visit.)  VITAL SIGNS:  reviewed GEN:  no acute distress NEURO:  alert and oriented x 3, no obvious focal deficit PSYCH:  normal  affect  ASSESSMENT & PLAN:    1.  Chronic atrial fibrillation: She denies rapid heart rhythm, palpitations, bleeding on Coumadin, or associated shortness of breath.  She denies chest discomfort.  Coumadin is followed by her primary care provider [place], [place].  She will follow-up with Dr. Ellyn Hack in 6 months and will need an EKG at that time.  2.  Chronic lower extremity edema: Improvement with weight loss.  She continues to lose weight.  She is not as active as she wants to be due to chronic osteoarthritis.  She does use a walker for ambulation.  3.  Chronic anxiety: She is being followed by psychiatry with medication adjustments.  COVID-19 Education: The signs and symptoms of COVID-19 were discussed with the patient and how to seek care for testing (follow up with PCP or arrange E-visit).  The importance of social distancing was discussed today.  Time:   Today, I have spent 25  minutes with the patient with telehealth technology discussing the above problems.     Medication Adjustments/Labs and Tests Ordered: Current medicines are reviewed at length with the patient today.  Concerns regarding medicines are outlined above.   Tests Ordered: No orders of the defined types were placed in this encounter.   Medication Changes: No orders of the defined types were placed in this encounter.   Disposition:  Follow up 6 months  Signed, Phill Myron. West Pugh, ANP, AACC  08/18/2019 11:09 AM    [facility]

## 2019-08-18 ENCOUNTER — Telehealth (INDEPENDENT_AMBULATORY_CARE_PROVIDER_SITE_OTHER): Payer: Medicare Other | Admitting: Adult Health

## 2019-08-18 ENCOUNTER — Encounter: Payer: Self-pay | Admitting: Adult Health

## 2019-08-18 VITALS — BP 110/77 | HR 75 | Ht 70.0 in | Wt 253.0 lb

## 2019-08-18 DIAGNOSIS — Z794 Long term (current) use of insulin: Secondary | ICD-10-CM

## 2019-08-18 DIAGNOSIS — I4811 Longstanding persistent atrial fibrillation: Secondary | ICD-10-CM

## 2019-08-18 DIAGNOSIS — I878 Other specified disorders of veins: Secondary | ICD-10-CM

## 2019-08-18 DIAGNOSIS — E1149 Type 2 diabetes mellitus with other diabetic neurological complication: Secondary | ICD-10-CM | POA: Diagnosis not present

## 2019-08-18 DIAGNOSIS — F418 Other specified anxiety disorders: Secondary | ICD-10-CM

## 2019-08-18 MED ORDER — FUROSEMIDE 20 MG PO TABS: 20.0000 mg | ORAL_TABLET | Freq: Every day | ORAL | 3 refills | Status: DC

## 2019-08-18 MED ORDER — POTASSIUM CHLORIDE CRYS ER 20 MEQ PO TBCR: 20.0000 meq | EXTENDED_RELEASE_TABLET | Freq: Every day | ORAL | 3 refills | Status: DC

## 2019-08-18 MED ORDER — METOPROLOL SUCCINATE ER 25 MG PO TB24: 25.0000 mg | ORAL_TABLET | Freq: Every day | ORAL | 3 refills | Status: DC

## 2019-08-18 NOTE — Patient Instructions (Signed)

## 2019-08-19 DIAGNOSIS — M4628 Osteomyelitis of vertebra, sacral and sacrococcygeal region: Secondary | ICD-10-CM | POA: Diagnosis not present

## 2019-08-19 DIAGNOSIS — I4821 Permanent atrial fibrillation: Secondary | ICD-10-CM | POA: Diagnosis not present

## 2019-08-19 DIAGNOSIS — L89154 Pressure ulcer of sacral region, stage 4: Secondary | ICD-10-CM | POA: Diagnosis not present

## 2019-08-19 DIAGNOSIS — I110 Hypertensive heart disease with heart failure: Secondary | ICD-10-CM | POA: Diagnosis not present

## 2019-08-19 DIAGNOSIS — E1169 Type 2 diabetes mellitus with other specified complication: Secondary | ICD-10-CM | POA: Diagnosis not present

## 2019-08-19 DIAGNOSIS — I5032 Chronic diastolic (congestive) heart failure: Secondary | ICD-10-CM | POA: Diagnosis not present

## 2019-08-22 DIAGNOSIS — I110 Hypertensive heart disease with heart failure: Secondary | ICD-10-CM | POA: Diagnosis not present

## 2019-08-22 DIAGNOSIS — M4628 Osteomyelitis of vertebra, sacral and sacrococcygeal region: Secondary | ICD-10-CM | POA: Diagnosis not present

## 2019-08-22 DIAGNOSIS — I4821 Permanent atrial fibrillation: Secondary | ICD-10-CM | POA: Diagnosis not present

## 2019-08-22 DIAGNOSIS — E1169 Type 2 diabetes mellitus with other specified complication: Secondary | ICD-10-CM | POA: Diagnosis not present

## 2019-08-22 DIAGNOSIS — I5032 Chronic diastolic (congestive) heart failure: Secondary | ICD-10-CM | POA: Diagnosis not present

## 2019-08-22 DIAGNOSIS — L89154 Pressure ulcer of sacral region, stage 4: Secondary | ICD-10-CM | POA: Diagnosis not present

## 2019-08-23 DIAGNOSIS — E1140 Type 2 diabetes mellitus with diabetic neuropathy, unspecified: Secondary | ICD-10-CM | POA: Diagnosis not present

## 2019-08-23 DIAGNOSIS — Z8616 Personal history of COVID-19: Secondary | ICD-10-CM | POA: Diagnosis not present

## 2019-08-23 DIAGNOSIS — Z466 Encounter for fitting and adjustment of urinary device: Secondary | ICD-10-CM | POA: Diagnosis not present

## 2019-08-23 DIAGNOSIS — F329 Major depressive disorder, single episode, unspecified: Secondary | ICD-10-CM | POA: Diagnosis not present

## 2019-08-23 DIAGNOSIS — E1144 Type 2 diabetes mellitus with diabetic amyotrophy: Secondary | ICD-10-CM | POA: Diagnosis not present

## 2019-08-23 DIAGNOSIS — I2510 Atherosclerotic heart disease of native coronary artery without angina pectoris: Secondary | ICD-10-CM | POA: Diagnosis not present

## 2019-08-23 DIAGNOSIS — G4733 Obstructive sleep apnea (adult) (pediatric): Secondary | ICD-10-CM | POA: Diagnosis not present

## 2019-08-23 DIAGNOSIS — L89154 Pressure ulcer of sacral region, stage 4: Secondary | ICD-10-CM | POA: Diagnosis not present

## 2019-08-23 DIAGNOSIS — G541 Lumbosacral plexus disorders: Secondary | ICD-10-CM | POA: Diagnosis not present

## 2019-08-23 DIAGNOSIS — I5032 Chronic diastolic (congestive) heart failure: Secondary | ICD-10-CM | POA: Diagnosis not present

## 2019-08-23 DIAGNOSIS — G8929 Other chronic pain: Secondary | ICD-10-CM | POA: Diagnosis not present

## 2019-08-23 DIAGNOSIS — Z794 Long term (current) use of insulin: Secondary | ICD-10-CM | POA: Diagnosis not present

## 2019-08-23 DIAGNOSIS — G40909 Epilepsy, unspecified, not intractable, without status epilepticus: Secondary | ICD-10-CM | POA: Diagnosis not present

## 2019-08-23 DIAGNOSIS — Z6835 Body mass index (BMI) 35.0-35.9, adult: Secondary | ICD-10-CM | POA: Diagnosis not present

## 2019-08-23 DIAGNOSIS — F411 Generalized anxiety disorder: Secondary | ICD-10-CM | POA: Diagnosis not present

## 2019-08-23 DIAGNOSIS — I110 Hypertensive heart disease with heart failure: Secondary | ICD-10-CM | POA: Diagnosis not present

## 2019-08-23 DIAGNOSIS — I872 Venous insufficiency (chronic) (peripheral): Secondary | ICD-10-CM | POA: Diagnosis not present

## 2019-08-23 DIAGNOSIS — Z9861 Coronary angioplasty status: Secondary | ICD-10-CM | POA: Diagnosis not present

## 2019-08-23 DIAGNOSIS — G2581 Restless legs syndrome: Secondary | ICD-10-CM | POA: Diagnosis not present

## 2019-08-23 DIAGNOSIS — E1151 Type 2 diabetes mellitus with diabetic peripheral angiopathy without gangrene: Secondary | ICD-10-CM | POA: Diagnosis not present

## 2019-08-23 DIAGNOSIS — Z79891 Long term (current) use of opiate analgesic: Secondary | ICD-10-CM | POA: Diagnosis not present

## 2019-08-23 DIAGNOSIS — M797 Fibromyalgia: Secondary | ICD-10-CM | POA: Diagnosis not present

## 2019-08-23 DIAGNOSIS — I951 Orthostatic hypotension: Secondary | ICD-10-CM | POA: Diagnosis not present

## 2019-08-23 DIAGNOSIS — I4821 Permanent atrial fibrillation: Secondary | ICD-10-CM | POA: Diagnosis not present

## 2019-08-31 DIAGNOSIS — I2510 Atherosclerotic heart disease of native coronary artery without angina pectoris: Secondary | ICD-10-CM | POA: Diagnosis not present

## 2019-08-31 DIAGNOSIS — I5032 Chronic diastolic (congestive) heart failure: Secondary | ICD-10-CM | POA: Diagnosis not present

## 2019-08-31 DIAGNOSIS — E1151 Type 2 diabetes mellitus with diabetic peripheral angiopathy without gangrene: Secondary | ICD-10-CM | POA: Diagnosis not present

## 2019-08-31 DIAGNOSIS — L89154 Pressure ulcer of sacral region, stage 4: Secondary | ICD-10-CM | POA: Diagnosis not present

## 2019-08-31 DIAGNOSIS — I110 Hypertensive heart disease with heart failure: Secondary | ICD-10-CM | POA: Diagnosis not present

## 2019-08-31 DIAGNOSIS — I4821 Permanent atrial fibrillation: Secondary | ICD-10-CM | POA: Diagnosis not present

## 2019-09-13 DIAGNOSIS — I5032 Chronic diastolic (congestive) heart failure: Secondary | ICD-10-CM | POA: Diagnosis not present

## 2019-09-13 DIAGNOSIS — L89154 Pressure ulcer of sacral region, stage 4: Secondary | ICD-10-CM | POA: Diagnosis not present

## 2019-09-13 DIAGNOSIS — I2510 Atherosclerotic heart disease of native coronary artery without angina pectoris: Secondary | ICD-10-CM | POA: Diagnosis not present

## 2019-09-13 DIAGNOSIS — E1151 Type 2 diabetes mellitus with diabetic peripheral angiopathy without gangrene: Secondary | ICD-10-CM | POA: Diagnosis not present

## 2019-09-13 DIAGNOSIS — I4821 Permanent atrial fibrillation: Secondary | ICD-10-CM | POA: Diagnosis not present

## 2019-09-13 DIAGNOSIS — I110 Hypertensive heart disease with heart failure: Secondary | ICD-10-CM | POA: Diagnosis not present

## 2019-09-21 DIAGNOSIS — I2510 Atherosclerotic heart disease of native coronary artery without angina pectoris: Secondary | ICD-10-CM | POA: Diagnosis not present

## 2019-09-21 DIAGNOSIS — I4821 Permanent atrial fibrillation: Secondary | ICD-10-CM | POA: Diagnosis not present

## 2019-09-21 DIAGNOSIS — I110 Hypertensive heart disease with heart failure: Secondary | ICD-10-CM | POA: Diagnosis not present

## 2019-09-21 DIAGNOSIS — I5032 Chronic diastolic (congestive) heart failure: Secondary | ICD-10-CM | POA: Diagnosis not present

## 2019-09-21 DIAGNOSIS — L89154 Pressure ulcer of sacral region, stage 4: Secondary | ICD-10-CM | POA: Diagnosis not present

## 2019-09-21 DIAGNOSIS — E1151 Type 2 diabetes mellitus with diabetic peripheral angiopathy without gangrene: Secondary | ICD-10-CM | POA: Diagnosis not present

## 2019-09-22 DIAGNOSIS — I4821 Permanent atrial fibrillation: Secondary | ICD-10-CM | POA: Diagnosis not present

## 2019-09-22 DIAGNOSIS — Z9861 Coronary angioplasty status: Secondary | ICD-10-CM | POA: Diagnosis not present

## 2019-09-22 DIAGNOSIS — I872 Venous insufficiency (chronic) (peripheral): Secondary | ICD-10-CM | POA: Diagnosis not present

## 2019-09-22 DIAGNOSIS — Z794 Long term (current) use of insulin: Secondary | ICD-10-CM | POA: Diagnosis not present

## 2019-09-22 DIAGNOSIS — E1151 Type 2 diabetes mellitus with diabetic peripheral angiopathy without gangrene: Secondary | ICD-10-CM | POA: Diagnosis not present

## 2019-09-22 DIAGNOSIS — F411 Generalized anxiety disorder: Secondary | ICD-10-CM | POA: Diagnosis not present

## 2019-09-22 DIAGNOSIS — G4733 Obstructive sleep apnea (adult) (pediatric): Secondary | ICD-10-CM | POA: Diagnosis not present

## 2019-09-22 DIAGNOSIS — M797 Fibromyalgia: Secondary | ICD-10-CM | POA: Diagnosis not present

## 2019-09-22 DIAGNOSIS — I951 Orthostatic hypotension: Secondary | ICD-10-CM | POA: Diagnosis not present

## 2019-09-22 DIAGNOSIS — L89154 Pressure ulcer of sacral region, stage 4: Secondary | ICD-10-CM | POA: Diagnosis not present

## 2019-09-22 DIAGNOSIS — Z8616 Personal history of COVID-19: Secondary | ICD-10-CM | POA: Diagnosis not present

## 2019-09-22 DIAGNOSIS — G40909 Epilepsy, unspecified, not intractable, without status epilepticus: Secondary | ICD-10-CM | POA: Diagnosis not present

## 2019-09-22 DIAGNOSIS — Z6835 Body mass index (BMI) 35.0-35.9, adult: Secondary | ICD-10-CM | POA: Diagnosis not present

## 2019-09-22 DIAGNOSIS — E1140 Type 2 diabetes mellitus with diabetic neuropathy, unspecified: Secondary | ICD-10-CM | POA: Diagnosis not present

## 2019-09-22 DIAGNOSIS — G541 Lumbosacral plexus disorders: Secondary | ICD-10-CM | POA: Diagnosis not present

## 2019-09-22 DIAGNOSIS — I5032 Chronic diastolic (congestive) heart failure: Secondary | ICD-10-CM | POA: Diagnosis not present

## 2019-09-22 DIAGNOSIS — F329 Major depressive disorder, single episode, unspecified: Secondary | ICD-10-CM | POA: Diagnosis not present

## 2019-09-22 DIAGNOSIS — G8929 Other chronic pain: Secondary | ICD-10-CM | POA: Diagnosis not present

## 2019-09-22 DIAGNOSIS — Z79891 Long term (current) use of opiate analgesic: Secondary | ICD-10-CM | POA: Diagnosis not present

## 2019-09-22 DIAGNOSIS — E1144 Type 2 diabetes mellitus with diabetic amyotrophy: Secondary | ICD-10-CM | POA: Diagnosis not present

## 2019-09-22 DIAGNOSIS — I2510 Atherosclerotic heart disease of native coronary artery without angina pectoris: Secondary | ICD-10-CM | POA: Diagnosis not present

## 2019-09-22 DIAGNOSIS — I110 Hypertensive heart disease with heart failure: Secondary | ICD-10-CM | POA: Diagnosis not present

## 2019-09-22 DIAGNOSIS — Z466 Encounter for fitting and adjustment of urinary device: Secondary | ICD-10-CM | POA: Diagnosis not present

## 2019-09-22 DIAGNOSIS — G2581 Restless legs syndrome: Secondary | ICD-10-CM | POA: Diagnosis not present

## 2019-09-28 DIAGNOSIS — I4821 Permanent atrial fibrillation: Secondary | ICD-10-CM | POA: Diagnosis not present

## 2019-09-28 DIAGNOSIS — I5032 Chronic diastolic (congestive) heart failure: Secondary | ICD-10-CM | POA: Diagnosis not present

## 2019-09-28 DIAGNOSIS — I2510 Atherosclerotic heart disease of native coronary artery without angina pectoris: Secondary | ICD-10-CM | POA: Diagnosis not present

## 2019-09-28 DIAGNOSIS — E1151 Type 2 diabetes mellitus with diabetic peripheral angiopathy without gangrene: Secondary | ICD-10-CM | POA: Diagnosis not present

## 2019-09-28 DIAGNOSIS — I110 Hypertensive heart disease with heart failure: Secondary | ICD-10-CM | POA: Diagnosis not present

## 2019-09-28 DIAGNOSIS — L89154 Pressure ulcer of sacral region, stage 4: Secondary | ICD-10-CM | POA: Diagnosis not present

## 2019-10-03 DIAGNOSIS — Z7901 Long term (current) use of anticoagulants: Secondary | ICD-10-CM | POA: Diagnosis not present

## 2019-10-06 DIAGNOSIS — I2510 Atherosclerotic heart disease of native coronary artery without angina pectoris: Secondary | ICD-10-CM | POA: Diagnosis not present

## 2019-10-06 DIAGNOSIS — I5032 Chronic diastolic (congestive) heart failure: Secondary | ICD-10-CM | POA: Diagnosis not present

## 2019-10-06 DIAGNOSIS — E1151 Type 2 diabetes mellitus with diabetic peripheral angiopathy without gangrene: Secondary | ICD-10-CM | POA: Diagnosis not present

## 2019-10-06 DIAGNOSIS — I4821 Permanent atrial fibrillation: Secondary | ICD-10-CM | POA: Diagnosis not present

## 2019-10-06 DIAGNOSIS — I110 Hypertensive heart disease with heart failure: Secondary | ICD-10-CM | POA: Diagnosis not present

## 2019-10-06 DIAGNOSIS — L89154 Pressure ulcer of sacral region, stage 4: Secondary | ICD-10-CM | POA: Diagnosis not present

## 2019-10-13 DIAGNOSIS — I5032 Chronic diastolic (congestive) heart failure: Secondary | ICD-10-CM | POA: Diagnosis not present

## 2019-10-13 DIAGNOSIS — I4821 Permanent atrial fibrillation: Secondary | ICD-10-CM | POA: Diagnosis not present

## 2019-10-13 DIAGNOSIS — I2510 Atherosclerotic heart disease of native coronary artery without angina pectoris: Secondary | ICD-10-CM | POA: Diagnosis not present

## 2019-10-13 DIAGNOSIS — L89154 Pressure ulcer of sacral region, stage 4: Secondary | ICD-10-CM | POA: Diagnosis not present

## 2019-10-13 DIAGNOSIS — E1151 Type 2 diabetes mellitus with diabetic peripheral angiopathy without gangrene: Secondary | ICD-10-CM | POA: Diagnosis not present

## 2019-10-13 DIAGNOSIS — I110 Hypertensive heart disease with heart failure: Secondary | ICD-10-CM | POA: Diagnosis not present

## 2019-10-19 DIAGNOSIS — I2510 Atherosclerotic heart disease of native coronary artery without angina pectoris: Secondary | ICD-10-CM | POA: Diagnosis not present

## 2019-10-19 DIAGNOSIS — I5032 Chronic diastolic (congestive) heart failure: Secondary | ICD-10-CM | POA: Diagnosis not present

## 2019-10-19 DIAGNOSIS — I4821 Permanent atrial fibrillation: Secondary | ICD-10-CM | POA: Diagnosis not present

## 2019-10-19 DIAGNOSIS — I110 Hypertensive heart disease with heart failure: Secondary | ICD-10-CM | POA: Diagnosis not present

## 2019-10-19 DIAGNOSIS — L89154 Pressure ulcer of sacral region, stage 4: Secondary | ICD-10-CM | POA: Diagnosis not present

## 2019-10-19 DIAGNOSIS — E1151 Type 2 diabetes mellitus with diabetic peripheral angiopathy without gangrene: Secondary | ICD-10-CM | POA: Diagnosis not present

## 2019-10-31 DIAGNOSIS — Z7901 Long term (current) use of anticoagulants: Secondary | ICD-10-CM | POA: Diagnosis not present

## 2019-11-01 ENCOUNTER — Other Ambulatory Visit: Payer: Self-pay

## 2019-11-01 ENCOUNTER — Telehealth: Payer: Self-pay | Admitting: Adult Health

## 2019-11-01 MED ORDER — FUROSEMIDE 20 MG PO TABS: 20.0000 mg | ORAL_TABLET | Freq: Every day | ORAL | 3 refills | Status: DC

## 2019-11-01 NOTE — Telephone Encounter (Signed)
    *  STAT* If patient is at the pharmacy, call can be transferred to refill team.   1. Which medications need to be refilled? (please list name of each medication and dose if known) furosemide (LASIX) 20 MG tablet  2. Which pharmacy/location (including street and city if local pharmacy) is medication to be sent to? [facility] [place], [place]  3. Do they need a 30 day or 90 day supply? 90 days  Pt said she is out of medications

## 2019-11-10 DIAGNOSIS — E1140 Type 2 diabetes mellitus with diabetic neuropathy, unspecified: Secondary | ICD-10-CM | POA: Diagnosis not present

## 2019-11-10 DIAGNOSIS — F339 Major depressive disorder, recurrent, unspecified: Secondary | ICD-10-CM | POA: Diagnosis not present

## 2019-11-10 DIAGNOSIS — G8929 Other chronic pain: Secondary | ICD-10-CM | POA: Diagnosis not present

## 2019-11-10 DIAGNOSIS — E1165 Type 2 diabetes mellitus with hyperglycemia: Secondary | ICD-10-CM | POA: Diagnosis not present

## 2019-11-10 DIAGNOSIS — E785 Hyperlipidemia, unspecified: Secondary | ICD-10-CM | POA: Diagnosis not present

## 2019-11-10 DIAGNOSIS — G629 Polyneuropathy, unspecified: Secondary | ICD-10-CM | POA: Diagnosis not present

## 2019-11-10 DIAGNOSIS — M545 Low back pain: Secondary | ICD-10-CM | POA: Diagnosis not present

## 2019-11-10 DIAGNOSIS — E041 Nontoxic single thyroid nodule: Secondary | ICD-10-CM | POA: Diagnosis not present

## 2019-11-10 DIAGNOSIS — E559 Vitamin D deficiency, unspecified: Secondary | ICD-10-CM | POA: Diagnosis not present

## 2019-11-10 DIAGNOSIS — Z79899 Other long term (current) drug therapy: Secondary | ICD-10-CM | POA: Diagnosis not present

## 2019-11-22 ENCOUNTER — Other Ambulatory Visit: Payer: Self-pay | Admitting: Cardiology

## 2019-11-22 NOTE — Telephone Encounter (Signed)
*  STAT* If patient is at the pharmacy, call can be transferred to refill team.   1. Which medications need to be refilled? (please list name of each medication and dose if known) furosemide (LASIX) 20 MG tablet / metoprolol succinate (TOPROL-XL) 25 MG 24 hr tablet / potassium chloride SA (KLOR-CON) 20 MEQ tablet   2. Which pharmacy/location (including street and city if local pharmacy) is medication to be sent to? [facility]  3. Do they need a 30 day or 90 day supply? [place]

## 2019-11-28 DIAGNOSIS — Z7901 Long term (current) use of anticoagulants: Secondary | ICD-10-CM | POA: Diagnosis not present

## 2019-11-29 DIAGNOSIS — R1310 Dysphagia, unspecified: Secondary | ICD-10-CM | POA: Diagnosis not present

## 2019-12-15 ENCOUNTER — Other Ambulatory Visit: Payer: Self-pay

## 2019-12-15 MED ORDER — METOPROLOL SUCCINATE ER 25 MG PO TB24
25.0000 mg | ORAL_TABLET | Freq: Every day | ORAL | 3 refills | Status: DC
Start: 2019-12-15 — End: 2020-11-15

## 2019-12-15 MED ORDER — FUROSEMIDE 20 MG PO TABS: 20.0000 mg | ORAL_TABLET | Freq: Every day | ORAL | 3 refills | Status: AC

## 2019-12-20 DIAGNOSIS — Z7901 Long term (current) use of anticoagulants: Secondary | ICD-10-CM | POA: Diagnosis not present

## 2019-12-21 DIAGNOSIS — R1310 Dysphagia, unspecified: Secondary | ICD-10-CM | POA: Diagnosis not present

## 2019-12-21 DIAGNOSIS — B3781 Candidal esophagitis: Secondary | ICD-10-CM | POA: Diagnosis not present

## 2019-12-26 DIAGNOSIS — Z7901 Long term (current) use of anticoagulants: Secondary | ICD-10-CM | POA: Diagnosis not present

## 2019-12-28 ENCOUNTER — Other Ambulatory Visit: Payer: Self-pay

## 2020-01-05 ENCOUNTER — Other Ambulatory Visit: Payer: Self-pay

## 2020-01-05 MED ORDER — POTASSIUM CHLORIDE CRYS ER 20 MEQ PO TBCR
20.0000 meq | EXTENDED_RELEASE_TABLET | Freq: Every day | ORAL | 3 refills | Status: DC
Start: 2020-01-05 — End: 2020-11-15

## 2020-01-23 DIAGNOSIS — Z7901 Long term (current) use of anticoagulants: Secondary | ICD-10-CM | POA: Diagnosis not present

## 2020-02-09 DIAGNOSIS — E1140 Type 2 diabetes mellitus with diabetic neuropathy, unspecified: Secondary | ICD-10-CM | POA: Diagnosis not present

## 2020-02-09 DIAGNOSIS — E785 Hyperlipidemia, unspecified: Secondary | ICD-10-CM | POA: Diagnosis not present

## 2020-02-09 DIAGNOSIS — Z23 Encounter for immunization: Secondary | ICD-10-CM | POA: Diagnosis not present

## 2020-02-09 DIAGNOSIS — E041 Nontoxic single thyroid nodule: Secondary | ICD-10-CM | POA: Diagnosis not present

## 2020-02-09 DIAGNOSIS — R0981 Nasal congestion: Secondary | ICD-10-CM | POA: Diagnosis not present

## 2020-02-09 DIAGNOSIS — E1165 Type 2 diabetes mellitus with hyperglycemia: Secondary | ICD-10-CM | POA: Diagnosis not present

## 2020-02-09 DIAGNOSIS — F339 Major depressive disorder, recurrent, unspecified: Secondary | ICD-10-CM | POA: Diagnosis not present

## 2020-02-09 DIAGNOSIS — G2581 Restless legs syndrome: Secondary | ICD-10-CM | POA: Diagnosis not present

## 2020-02-09 DIAGNOSIS — G8929 Other chronic pain: Secondary | ICD-10-CM | POA: Diagnosis not present

## 2020-02-09 DIAGNOSIS — M545 Low back pain, unspecified: Secondary | ICD-10-CM | POA: Diagnosis not present

## 2020-02-09 DIAGNOSIS — Z79899 Other long term (current) drug therapy: Secondary | ICD-10-CM | POA: Diagnosis not present

## 2020-02-09 DIAGNOSIS — E559 Vitamin D deficiency, unspecified: Secondary | ICD-10-CM | POA: Diagnosis not present

## 2020-02-20 DIAGNOSIS — Z7901 Long term (current) use of anticoagulants: Secondary | ICD-10-CM | POA: Diagnosis not present

## 2020-03-19 DIAGNOSIS — Z7901 Long term (current) use of anticoagulants: Secondary | ICD-10-CM | POA: Diagnosis not present

## 2020-04-16 DIAGNOSIS — Z7901 Long term (current) use of anticoagulants: Secondary | ICD-10-CM | POA: Diagnosis not present

## 2020-05-07 DIAGNOSIS — I130 Hypertensive heart and chronic kidney disease with heart failure and stage 1 through stage 4 chronic kidney disease, or unspecified chronic kidney disease: Secondary | ICD-10-CM | POA: Diagnosis not present

## 2020-05-07 DIAGNOSIS — E1140 Type 2 diabetes mellitus with diabetic neuropathy, unspecified: Secondary | ICD-10-CM | POA: Diagnosis not present

## 2020-05-07 DIAGNOSIS — E785 Hyperlipidemia, unspecified: Secondary | ICD-10-CM | POA: Diagnosis not present

## 2020-05-07 DIAGNOSIS — Z79899 Other long term (current) drug therapy: Secondary | ICD-10-CM | POA: Diagnosis not present

## 2020-05-07 DIAGNOSIS — E041 Nontoxic single thyroid nodule: Secondary | ICD-10-CM | POA: Diagnosis not present

## 2020-05-07 DIAGNOSIS — E559 Vitamin D deficiency, unspecified: Secondary | ICD-10-CM | POA: Diagnosis not present

## 2020-05-07 DIAGNOSIS — E1165 Type 2 diabetes mellitus with hyperglycemia: Secondary | ICD-10-CM | POA: Diagnosis not present

## 2020-05-07 DIAGNOSIS — I509 Heart failure, unspecified: Secondary | ICD-10-CM | POA: Diagnosis not present

## 2020-05-07 DIAGNOSIS — M545 Low back pain, unspecified: Secondary | ICD-10-CM | POA: Diagnosis not present

## 2020-05-07 DIAGNOSIS — F339 Major depressive disorder, recurrent, unspecified: Secondary | ICD-10-CM | POA: Diagnosis not present

## 2020-05-07 DIAGNOSIS — I482 Chronic atrial fibrillation, unspecified: Secondary | ICD-10-CM | POA: Diagnosis not present

## 2020-05-07 DIAGNOSIS — Z7901 Long term (current) use of anticoagulants: Secondary | ICD-10-CM | POA: Diagnosis not present

## 2020-05-07 DIAGNOSIS — G8929 Other chronic pain: Secondary | ICD-10-CM | POA: Diagnosis not present

## 2020-05-15 ENCOUNTER — Telehealth: Payer: Self-pay | Admitting: Cardiology

## 2020-05-15 NOTE — Telephone Encounter (Signed)
New Message:     Pt says she was having problems and her primary doctor stopped her Metoprolol. He put her on Propranolol.. She wants to know what does Dr Selena Batten think about this change?

## 2020-05-15 NOTE — Telephone Encounter (Signed)
Returned call to patient. Made patient aware of Dr. Allison Quarry recommendations.  Advised patient to monitor blood pressure and heart rate along with symptoms and to let us know if there are any issues, questions, or concerns. Patient verbalized understanding of all instructions.

## 2020-05-15 NOTE — Telephone Encounter (Signed)
Get some okay with atorvastatin if she tolerates it.  Switch from Toprol to propranolol for tremor makes sense, as long as her palpitations/A. fib and blood pressure are adequately covered.   Glenetta Hew, MD

## 2020-05-15 NOTE — Telephone Encounter (Signed)
Returned call to patient who states that she recently saw her PCP who stopped her Metoprolol Succinate and Started her on Propanolol 40 mg Twice Daily. Patient states this change was made due to her tremor in her L Leg. Patient states overall she feels well, patient denies any symptoms at this time. Patient states her blood pressure and HR have been normal. Patient unable to provide readings at this time. Patient also states she was started on Atorvastatin 20 mg daily. Patient would like to make Dr. Ellyn Hack aware and make sure he was okay with this. Advised patient I would forward message to Dr. Ellyn Hack. Advised patient to call back with any issues, questions, or concerns. Patient verbalized understanding.

## 2020-05-28 DIAGNOSIS — Z7901 Long term (current) use of anticoagulants: Secondary | ICD-10-CM | POA: Diagnosis not present

## 2020-06-25 DIAGNOSIS — Z7901 Long term (current) use of anticoagulants: Secondary | ICD-10-CM | POA: Diagnosis not present

## 2020-07-10 DIAGNOSIS — Z0000 Encounter for general adult medical examination without abnormal findings: Secondary | ICD-10-CM | POA: Diagnosis not present

## 2020-07-10 DIAGNOSIS — Z1331 Encounter for screening for depression: Secondary | ICD-10-CM | POA: Diagnosis not present

## 2020-07-10 DIAGNOSIS — E785 Hyperlipidemia, unspecified: Secondary | ICD-10-CM | POA: Diagnosis not present

## 2020-07-16 DIAGNOSIS — Z7901 Long term (current) use of anticoagulants: Secondary | ICD-10-CM | POA: Diagnosis not present

## 2020-07-19 DIAGNOSIS — I110 Hypertensive heart disease with heart failure: Secondary | ICD-10-CM | POA: Diagnosis not present

## 2020-07-19 DIAGNOSIS — I2510 Atherosclerotic heart disease of native coronary artery without angina pectoris: Secondary | ICD-10-CM | POA: Diagnosis not present

## 2020-07-19 DIAGNOSIS — M1711 Unilateral primary osteoarthritis, right knee: Secondary | ICD-10-CM | POA: Diagnosis not present

## 2020-07-19 DIAGNOSIS — K5750 Diverticulosis of both small and large intestine without perforation or abscess without bleeding: Secondary | ICD-10-CM | POA: Diagnosis not present

## 2020-07-19 DIAGNOSIS — R296 Repeated falls: Secondary | ICD-10-CM | POA: Diagnosis not present

## 2020-07-19 DIAGNOSIS — R531 Weakness: Secondary | ICD-10-CM | POA: Diagnosis not present

## 2020-07-19 DIAGNOSIS — N390 Urinary tract infection, site not specified: Secondary | ICD-10-CM | POA: Diagnosis not present

## 2020-07-19 DIAGNOSIS — R220 Localized swelling, mass and lump, head: Secondary | ICD-10-CM | POA: Diagnosis not present

## 2020-07-19 DIAGNOSIS — N281 Cyst of kidney, acquired: Secondary | ICD-10-CM | POA: Diagnosis not present

## 2020-07-19 DIAGNOSIS — M47814 Spondylosis without myelopathy or radiculopathy, thoracic region: Secondary | ICD-10-CM | POA: Diagnosis not present

## 2020-07-19 DIAGNOSIS — M810 Age-related osteoporosis without current pathological fracture: Secondary | ICD-10-CM | POA: Diagnosis not present

## 2020-07-19 DIAGNOSIS — Z96651 Presence of right artificial knee joint: Secondary | ICD-10-CM | POA: Diagnosis not present

## 2020-07-19 DIAGNOSIS — S32049A Unspecified fracture of fourth lumbar vertebra, initial encounter for closed fracture: Secondary | ICD-10-CM | POA: Diagnosis not present

## 2020-07-19 DIAGNOSIS — I358 Other nonrheumatic aortic valve disorders: Secondary | ICD-10-CM | POA: Diagnosis not present

## 2020-07-19 DIAGNOSIS — W19XXXA Unspecified fall, initial encounter: Secondary | ICD-10-CM | POA: Diagnosis not present

## 2020-07-19 DIAGNOSIS — G319 Degenerative disease of nervous system, unspecified: Secondary | ICD-10-CM | POA: Diagnosis not present

## 2020-07-19 DIAGNOSIS — S32048A Other fracture of fourth lumbar vertebra, initial encounter for closed fracture: Secondary | ICD-10-CM | POA: Diagnosis not present

## 2020-07-19 DIAGNOSIS — M419 Scoliosis, unspecified: Secondary | ICD-10-CM | POA: Diagnosis not present

## 2020-07-19 DIAGNOSIS — D509 Iron deficiency anemia, unspecified: Secondary | ICD-10-CM | POA: Diagnosis not present

## 2020-07-19 DIAGNOSIS — E538 Deficiency of other specified B group vitamins: Secondary | ICD-10-CM | POA: Diagnosis not present

## 2020-07-19 DIAGNOSIS — W1839XA Other fall on same level, initial encounter: Secondary | ICD-10-CM | POA: Diagnosis not present

## 2020-07-19 DIAGNOSIS — R0902 Hypoxemia: Secondary | ICD-10-CM | POA: Diagnosis not present

## 2020-07-19 DIAGNOSIS — M4802 Spinal stenosis, cervical region: Secondary | ICD-10-CM | POA: Diagnosis not present

## 2020-07-19 DIAGNOSIS — G2111 Neuroleptic induced parkinsonism: Secondary | ICD-10-CM | POA: Diagnosis not present

## 2020-07-19 DIAGNOSIS — Z96641 Presence of right artificial hip joint: Secondary | ICD-10-CM | POA: Diagnosis not present

## 2020-07-19 DIAGNOSIS — I5030 Unspecified diastolic (congestive) heart failure: Secondary | ICD-10-CM | POA: Diagnosis not present

## 2020-07-19 DIAGNOSIS — R918 Other nonspecific abnormal finding of lung field: Secondary | ICD-10-CM | POA: Diagnosis not present

## 2020-07-19 DIAGNOSIS — Y998 Other external cause status: Secondary | ICD-10-CM | POA: Diagnosis not present

## 2020-07-19 DIAGNOSIS — M4312 Spondylolisthesis, cervical region: Secondary | ICD-10-CM | POA: Diagnosis not present

## 2020-07-19 DIAGNOSIS — M4602 Spinal enthesopathy, cervical region: Secondary | ICD-10-CM | POA: Diagnosis not present

## 2020-07-19 DIAGNOSIS — Z20822 Contact with and (suspected) exposure to covid-19: Secondary | ICD-10-CM | POA: Diagnosis not present

## 2020-07-19 DIAGNOSIS — M47816 Spondylosis without myelopathy or radiculopathy, lumbar region: Secondary | ICD-10-CM | POA: Diagnosis not present

## 2020-07-19 DIAGNOSIS — F330 Major depressive disorder, recurrent, mild: Secondary | ICD-10-CM | POA: Diagnosis not present

## 2020-07-19 DIAGNOSIS — J984 Other disorders of lung: Secondary | ICD-10-CM | POA: Diagnosis not present

## 2020-07-19 DIAGNOSIS — S80919A Unspecified superficial injury of unspecified knee, initial encounter: Secondary | ICD-10-CM | POA: Diagnosis not present

## 2020-07-19 DIAGNOSIS — M25551 Pain in right hip: Secondary | ICD-10-CM | POA: Diagnosis not present

## 2020-07-19 DIAGNOSIS — I4819 Other persistent atrial fibrillation: Secondary | ICD-10-CM | POA: Diagnosis not present

## 2020-07-19 DIAGNOSIS — M25561 Pain in right knee: Secondary | ICD-10-CM | POA: Diagnosis not present

## 2020-07-19 DIAGNOSIS — E1169 Type 2 diabetes mellitus with other specified complication: Secondary | ICD-10-CM | POA: Diagnosis not present

## 2020-07-19 DIAGNOSIS — M4316 Spondylolisthesis, lumbar region: Secondary | ICD-10-CM | POA: Diagnosis not present

## 2020-07-19 DIAGNOSIS — I4891 Unspecified atrial fibrillation: Secondary | ICD-10-CM | POA: Diagnosis not present

## 2020-07-19 DIAGNOSIS — Z794 Long term (current) use of insulin: Secondary | ICD-10-CM | POA: Diagnosis not present

## 2020-07-19 DIAGNOSIS — M5124 Other intervertebral disc displacement, thoracic region: Secondary | ICD-10-CM | POA: Diagnosis not present

## 2020-07-19 DIAGNOSIS — L89153 Pressure ulcer of sacral region, stage 3: Secondary | ICD-10-CM | POA: Diagnosis not present

## 2020-07-19 DIAGNOSIS — E042 Nontoxic multinodular goiter: Secondary | ICD-10-CM | POA: Diagnosis not present

## 2020-07-19 DIAGNOSIS — M79604 Pain in right leg: Secondary | ICD-10-CM | POA: Diagnosis not present

## 2020-07-19 DIAGNOSIS — E041 Nontoxic single thyroid nodule: Secondary | ICD-10-CM | POA: Diagnosis not present

## 2020-07-20 DIAGNOSIS — I5032 Chronic diastolic (congestive) heart failure: Secondary | ICD-10-CM | POA: Diagnosis present

## 2020-07-20 DIAGNOSIS — G4733 Obstructive sleep apnea (adult) (pediatric): Secondary | ICD-10-CM | POA: Diagnosis not present

## 2020-07-20 DIAGNOSIS — G2581 Restless legs syndrome: Secondary | ICD-10-CM | POA: Diagnosis present

## 2020-07-20 DIAGNOSIS — L0591 Pilonidal cyst without abscess: Secondary | ICD-10-CM | POA: Diagnosis present

## 2020-07-20 DIAGNOSIS — H409 Unspecified glaucoma: Secondary | ICD-10-CM | POA: Diagnosis not present

## 2020-07-20 DIAGNOSIS — F419 Anxiety disorder, unspecified: Secondary | ICD-10-CM | POA: Diagnosis not present

## 2020-07-20 DIAGNOSIS — E1139 Type 2 diabetes mellitus with other diabetic ophthalmic complication: Secondary | ICD-10-CM | POA: Diagnosis present

## 2020-07-20 DIAGNOSIS — G20 Parkinson's disease: Secondary | ICD-10-CM | POA: Diagnosis not present

## 2020-07-20 DIAGNOSIS — I4891 Unspecified atrial fibrillation: Secondary | ICD-10-CM | POA: Diagnosis not present

## 2020-07-20 DIAGNOSIS — R296 Repeated falls: Secondary | ICD-10-CM | POA: Diagnosis not present

## 2020-07-20 DIAGNOSIS — E1122 Type 2 diabetes mellitus with diabetic chronic kidney disease: Secondary | ICD-10-CM | POA: Diagnosis not present

## 2020-07-20 DIAGNOSIS — L89153 Pressure ulcer of sacral region, stage 3: Secondary | ICD-10-CM | POA: Diagnosis not present

## 2020-07-20 DIAGNOSIS — R1905 Periumbilic swelling, mass or lump: Secondary | ICD-10-CM | POA: Diagnosis not present

## 2020-07-20 DIAGNOSIS — T43505A Adverse effect of unspecified antipsychotics and neuroleptics, initial encounter: Secondary | ICD-10-CM | POA: Diagnosis not present

## 2020-07-20 DIAGNOSIS — I517 Cardiomegaly: Secondary | ICD-10-CM | POA: Diagnosis not present

## 2020-07-20 DIAGNOSIS — I469 Cardiac arrest, cause unspecified: Secondary | ICD-10-CM | POA: Diagnosis not present

## 2020-07-20 DIAGNOSIS — F32A Depression, unspecified: Secondary | ICD-10-CM | POA: Diagnosis not present

## 2020-07-20 DIAGNOSIS — Z20822 Contact with and (suspected) exposure to covid-19: Secondary | ICD-10-CM | POA: Diagnosis present

## 2020-07-20 DIAGNOSIS — I5030 Unspecified diastolic (congestive) heart failure: Secondary | ICD-10-CM | POA: Diagnosis not present

## 2020-07-20 DIAGNOSIS — R159 Full incontinence of feces: Secondary | ICD-10-CM | POA: Diagnosis not present

## 2020-07-20 DIAGNOSIS — R911 Solitary pulmonary nodule: Secondary | ICD-10-CM | POA: Diagnosis not present

## 2020-07-20 DIAGNOSIS — L89150 Pressure ulcer of sacral region, unstageable: Secondary | ICD-10-CM | POA: Diagnosis not present

## 2020-07-20 DIAGNOSIS — Z6832 Body mass index (BMI) 32.0-32.9, adult: Secondary | ICD-10-CM | POA: Diagnosis not present

## 2020-07-20 DIAGNOSIS — E1169 Type 2 diabetes mellitus with other specified complication: Secondary | ICD-10-CM | POA: Diagnosis not present

## 2020-07-20 DIAGNOSIS — R918 Other nonspecific abnormal finding of lung field: Secondary | ICD-10-CM | POA: Diagnosis not present

## 2020-07-20 DIAGNOSIS — S32009D Unspecified fracture of unspecified lumbar vertebra, subsequent encounter for fracture with routine healing: Secondary | ICD-10-CM | POA: Diagnosis not present

## 2020-07-20 DIAGNOSIS — G250 Essential tremor: Secondary | ICD-10-CM | POA: Diagnosis present

## 2020-07-20 DIAGNOSIS — R5381 Other malaise: Secondary | ICD-10-CM | POA: Diagnosis not present

## 2020-07-20 DIAGNOSIS — I509 Heart failure, unspecified: Secondary | ICD-10-CM | POA: Diagnosis not present

## 2020-07-20 DIAGNOSIS — E119 Type 2 diabetes mellitus without complications: Secondary | ICD-10-CM | POA: Diagnosis not present

## 2020-07-20 DIAGNOSIS — Z794 Long term (current) use of insulin: Secondary | ICD-10-CM | POA: Diagnosis not present

## 2020-07-20 DIAGNOSIS — I110 Hypertensive heart disease with heart failure: Secondary | ICD-10-CM | POA: Diagnosis not present

## 2020-07-20 DIAGNOSIS — I4819 Other persistent atrial fibrillation: Secondary | ICD-10-CM | POA: Diagnosis not present

## 2020-07-20 DIAGNOSIS — I358 Other nonrheumatic aortic valve disorders: Secondary | ICD-10-CM | POA: Diagnosis not present

## 2020-07-20 DIAGNOSIS — I10 Essential (primary) hypertension: Secondary | ICD-10-CM | POA: Diagnosis not present

## 2020-07-20 DIAGNOSIS — S32049A Unspecified fracture of fourth lumbar vertebra, initial encounter for closed fracture: Secondary | ICD-10-CM | POA: Diagnosis present

## 2020-07-20 DIAGNOSIS — E669 Obesity, unspecified: Secondary | ICD-10-CM | POA: Diagnosis present

## 2020-07-20 DIAGNOSIS — H2513 Age-related nuclear cataract, bilateral: Secondary | ICD-10-CM | POA: Diagnosis present

## 2020-07-20 DIAGNOSIS — R0902 Hypoxemia: Secondary | ICD-10-CM | POA: Diagnosis not present

## 2020-07-20 DIAGNOSIS — G8190 Hemiplegia, unspecified affecting unspecified side: Secondary | ICD-10-CM | POA: Diagnosis not present

## 2020-07-20 DIAGNOSIS — E611 Iron deficiency: Secondary | ICD-10-CM | POA: Diagnosis not present

## 2020-07-20 DIAGNOSIS — H40033 Anatomical narrow angle, bilateral: Secondary | ICD-10-CM | POA: Diagnosis present

## 2020-07-20 DIAGNOSIS — G2111 Neuroleptic induced parkinsonism: Secondary | ICD-10-CM | POA: Diagnosis not present

## 2020-07-20 DIAGNOSIS — M797 Fibromyalgia: Secondary | ICD-10-CM | POA: Diagnosis not present

## 2020-07-20 DIAGNOSIS — M1611 Unilateral primary osteoarthritis, right hip: Secondary | ICD-10-CM | POA: Diagnosis present

## 2020-07-20 DIAGNOSIS — E1136 Type 2 diabetes mellitus with diabetic cataract: Secondary | ICD-10-CM | POA: Diagnosis present

## 2020-07-20 DIAGNOSIS — D519 Vitamin B12 deficiency anemia, unspecified: Secondary | ICD-10-CM | POA: Diagnosis not present

## 2020-07-20 DIAGNOSIS — I482 Chronic atrial fibrillation, unspecified: Secondary | ICD-10-CM | POA: Diagnosis not present

## 2020-07-20 DIAGNOSIS — G2119 Other drug induced secondary parkinsonism: Secondary | ICD-10-CM | POA: Diagnosis not present

## 2020-07-20 DIAGNOSIS — F411 Generalized anxiety disorder: Secondary | ICD-10-CM | POA: Diagnosis present

## 2020-07-20 DIAGNOSIS — N390 Urinary tract infection, site not specified: Secondary | ICD-10-CM | POA: Diagnosis not present

## 2020-07-20 DIAGNOSIS — R4182 Altered mental status, unspecified: Secondary | ICD-10-CM | POA: Diagnosis not present

## 2020-07-20 DIAGNOSIS — Z7901 Long term (current) use of anticoagulants: Secondary | ICD-10-CM | POA: Diagnosis not present

## 2020-07-20 DIAGNOSIS — E041 Nontoxic single thyroid nodule: Secondary | ICD-10-CM | POA: Diagnosis present

## 2020-07-20 DIAGNOSIS — B9620 Unspecified Escherichia coli [E. coli] as the cause of diseases classified elsewhere: Secondary | ICD-10-CM | POA: Diagnosis not present

## 2020-07-20 DIAGNOSIS — G4700 Insomnia, unspecified: Secondary | ICD-10-CM | POA: Diagnosis present

## 2020-07-20 DIAGNOSIS — F330 Major depressive disorder, recurrent, mild: Secondary | ICD-10-CM | POA: Diagnosis present

## 2020-07-20 DIAGNOSIS — Z9989 Dependence on other enabling machines and devices: Secondary | ICD-10-CM | POA: Diagnosis not present

## 2020-07-20 DIAGNOSIS — E538 Deficiency of other specified B group vitamins: Secondary | ICD-10-CM | POA: Diagnosis not present

## 2020-07-25 DIAGNOSIS — I517 Cardiomegaly: Secondary | ICD-10-CM | POA: Diagnosis not present

## 2020-07-25 DIAGNOSIS — I4891 Unspecified atrial fibrillation: Secondary | ICD-10-CM | POA: Diagnosis not present

## 2020-07-25 DIAGNOSIS — I5030 Unspecified diastolic (congestive) heart failure: Secondary | ICD-10-CM | POA: Diagnosis not present

## 2020-07-25 DIAGNOSIS — I5032 Chronic diastolic (congestive) heart failure: Secondary | ICD-10-CM | POA: Diagnosis not present

## 2020-07-25 DIAGNOSIS — R635 Abnormal weight gain: Secondary | ICD-10-CM | POA: Diagnosis not present

## 2020-07-25 DIAGNOSIS — D696 Thrombocytopenia, unspecified: Secondary | ICD-10-CM | POA: Diagnosis not present

## 2020-07-25 DIAGNOSIS — G4733 Obstructive sleep apnea (adult) (pediatric): Secondary | ICD-10-CM | POA: Diagnosis not present

## 2020-07-25 DIAGNOSIS — G2119 Other drug induced secondary parkinsonism: Secondary | ICD-10-CM | POA: Diagnosis not present

## 2020-07-25 DIAGNOSIS — J90 Pleural effusion, not elsewhere classified: Secondary | ICD-10-CM | POA: Diagnosis not present

## 2020-07-25 DIAGNOSIS — H40033 Anatomical narrow angle, bilateral: Secondary | ICD-10-CM | POA: Diagnosis not present

## 2020-07-25 DIAGNOSIS — L89150 Pressure ulcer of sacral region, unstageable: Secondary | ICD-10-CM | POA: Diagnosis not present

## 2020-07-25 DIAGNOSIS — G20 Parkinson's disease: Secondary | ICD-10-CM | POA: Diagnosis not present

## 2020-07-25 DIAGNOSIS — E041 Nontoxic single thyroid nodule: Secondary | ICD-10-CM | POA: Diagnosis not present

## 2020-07-25 DIAGNOSIS — I110 Hypertensive heart disease with heart failure: Secondary | ICD-10-CM | POA: Diagnosis not present

## 2020-07-25 DIAGNOSIS — F330 Major depressive disorder, recurrent, mild: Secondary | ICD-10-CM | POA: Diagnosis not present

## 2020-07-25 DIAGNOSIS — F5105 Insomnia due to other mental disorder: Secondary | ICD-10-CM | POA: Diagnosis not present

## 2020-07-25 DIAGNOSIS — K5901 Slow transit constipation: Secondary | ICD-10-CM | POA: Diagnosis not present

## 2020-07-25 DIAGNOSIS — F321 Major depressive disorder, single episode, moderate: Secondary | ICD-10-CM | POA: Diagnosis not present

## 2020-07-25 DIAGNOSIS — E1122 Type 2 diabetes mellitus with diabetic chronic kidney disease: Secondary | ICD-10-CM | POA: Diagnosis not present

## 2020-07-25 DIAGNOSIS — E611 Iron deficiency: Secondary | ICD-10-CM | POA: Diagnosis not present

## 2020-07-25 DIAGNOSIS — I469 Cardiac arrest, cause unspecified: Secondary | ICD-10-CM | POA: Diagnosis not present

## 2020-07-25 DIAGNOSIS — R1905 Periumbilic swelling, mass or lump: Secondary | ICD-10-CM | POA: Diagnosis not present

## 2020-07-25 DIAGNOSIS — E0789 Other specified disorders of thyroid: Secondary | ICD-10-CM | POA: Diagnosis not present

## 2020-07-25 DIAGNOSIS — E119 Type 2 diabetes mellitus without complications: Secondary | ICD-10-CM | POA: Diagnosis not present

## 2020-07-25 DIAGNOSIS — I509 Heart failure, unspecified: Secondary | ICD-10-CM | POA: Diagnosis not present

## 2020-07-25 DIAGNOSIS — N390 Urinary tract infection, site not specified: Secondary | ICD-10-CM | POA: Diagnosis not present

## 2020-07-25 DIAGNOSIS — I482 Chronic atrial fibrillation, unspecified: Secondary | ICD-10-CM | POA: Diagnosis not present

## 2020-07-25 DIAGNOSIS — Z9989 Dependence on other enabling machines and devices: Secondary | ICD-10-CM | POA: Diagnosis not present

## 2020-07-25 DIAGNOSIS — R262 Difficulty in walking, not elsewhere classified: Secondary | ICD-10-CM | POA: Diagnosis not present

## 2020-07-25 DIAGNOSIS — R0600 Dyspnea, unspecified: Secondary | ICD-10-CM | POA: Diagnosis not present

## 2020-07-25 DIAGNOSIS — G250 Essential tremor: Secondary | ICD-10-CM | POA: Diagnosis not present

## 2020-07-25 DIAGNOSIS — F32A Depression, unspecified: Secondary | ICD-10-CM | POA: Diagnosis not present

## 2020-07-25 DIAGNOSIS — E042 Nontoxic multinodular goiter: Secondary | ICD-10-CM | POA: Diagnosis not present

## 2020-07-25 DIAGNOSIS — Z794 Long term (current) use of insulin: Secondary | ICD-10-CM | POA: Diagnosis not present

## 2020-07-25 DIAGNOSIS — D649 Anemia, unspecified: Secondary | ICD-10-CM | POA: Diagnosis not present

## 2020-07-25 DIAGNOSIS — I480 Paroxysmal atrial fibrillation: Secondary | ICD-10-CM | POA: Diagnosis not present

## 2020-07-25 DIAGNOSIS — H409 Unspecified glaucoma: Secondary | ICD-10-CM | POA: Diagnosis not present

## 2020-07-25 DIAGNOSIS — B9620 Unspecified Escherichia coli [E. coli] as the cause of diseases classified elsewhere: Secondary | ICD-10-CM | POA: Diagnosis not present

## 2020-07-25 DIAGNOSIS — R4182 Altered mental status, unspecified: Secondary | ICD-10-CM | POA: Diagnosis not present

## 2020-07-25 DIAGNOSIS — E079 Disorder of thyroid, unspecified: Secondary | ICD-10-CM | POA: Diagnosis not present

## 2020-07-25 DIAGNOSIS — R296 Repeated falls: Secondary | ICD-10-CM | POA: Diagnosis not present

## 2020-07-25 DIAGNOSIS — R4189 Other symptoms and signs involving cognitive functions and awareness: Secondary | ICD-10-CM | POA: Diagnosis not present

## 2020-07-25 DIAGNOSIS — Z7901 Long term (current) use of anticoagulants: Secondary | ICD-10-CM | POA: Diagnosis not present

## 2020-07-25 DIAGNOSIS — F411 Generalized anxiety disorder: Secondary | ICD-10-CM | POA: Diagnosis not present

## 2020-07-25 DIAGNOSIS — R911 Solitary pulmonary nodule: Secondary | ICD-10-CM | POA: Diagnosis not present

## 2020-07-25 DIAGNOSIS — R410 Disorientation, unspecified: Secondary | ICD-10-CM | POA: Diagnosis not present

## 2020-07-25 DIAGNOSIS — R0902 Hypoxemia: Secondary | ICD-10-CM | POA: Diagnosis not present

## 2020-07-25 DIAGNOSIS — J9601 Acute respiratory failure with hypoxia: Secondary | ICD-10-CM | POA: Diagnosis not present

## 2020-07-25 DIAGNOSIS — I10 Essential (primary) hypertension: Secondary | ICD-10-CM | POA: Diagnosis not present

## 2020-07-25 DIAGNOSIS — G8190 Hemiplegia, unspecified affecting unspecified side: Secondary | ICD-10-CM | POA: Diagnosis not present

## 2020-07-25 DIAGNOSIS — E118 Type 2 diabetes mellitus with unspecified complications: Secondary | ICD-10-CM | POA: Diagnosis not present

## 2020-07-25 DIAGNOSIS — D519 Vitamin B12 deficiency anemia, unspecified: Secondary | ICD-10-CM | POA: Diagnosis not present

## 2020-07-25 DIAGNOSIS — I872 Venous insufficiency (chronic) (peripheral): Secondary | ICD-10-CM | POA: Diagnosis not present

## 2020-07-25 DIAGNOSIS — M797 Fibromyalgia: Secondary | ICD-10-CM | POA: Diagnosis not present

## 2020-07-25 DIAGNOSIS — S32009D Unspecified fracture of unspecified lumbar vertebra, subsequent encounter for fracture with routine healing: Secondary | ICD-10-CM | POA: Diagnosis not present

## 2020-07-25 DIAGNOSIS — R5381 Other malaise: Secondary | ICD-10-CM | POA: Diagnosis not present

## 2020-07-25 DIAGNOSIS — R269 Unspecified abnormalities of gait and mobility: Secondary | ICD-10-CM | POA: Diagnosis not present

## 2020-07-25 DIAGNOSIS — Z7189 Other specified counseling: Secondary | ICD-10-CM | POA: Diagnosis not present

## 2020-07-25 DIAGNOSIS — F4321 Adjustment disorder with depressed mood: Secondary | ICD-10-CM | POA: Diagnosis not present

## 2020-07-25 DIAGNOSIS — F39 Unspecified mood [affective] disorder: Secondary | ICD-10-CM | POA: Diagnosis not present

## 2020-07-25 DIAGNOSIS — E0829 Diabetes mellitus due to underlying condition with other diabetic kidney complication: Secondary | ICD-10-CM | POA: Diagnosis not present

## 2020-07-25 DIAGNOSIS — F419 Anxiety disorder, unspecified: Secondary | ICD-10-CM | POA: Diagnosis not present

## 2020-07-25 DIAGNOSIS — D539 Nutritional anemia, unspecified: Secondary | ICD-10-CM | POA: Diagnosis not present

## 2020-07-25 DIAGNOSIS — M1611 Unilateral primary osteoarthritis, right hip: Secondary | ICD-10-CM | POA: Diagnosis not present

## 2020-07-25 DIAGNOSIS — S32009A Unspecified fracture of unspecified lumbar vertebra, initial encounter for closed fracture: Secondary | ICD-10-CM | POA: Diagnosis not present

## 2020-08-01 DIAGNOSIS — E079 Disorder of thyroid, unspecified: Secondary | ICD-10-CM | POA: Diagnosis not present

## 2020-08-01 DIAGNOSIS — G4733 Obstructive sleep apnea (adult) (pediatric): Secondary | ICD-10-CM | POA: Diagnosis not present

## 2020-08-01 DIAGNOSIS — R262 Difficulty in walking, not elsewhere classified: Secondary | ICD-10-CM | POA: Diagnosis not present

## 2020-08-01 DIAGNOSIS — I872 Venous insufficiency (chronic) (peripheral): Secondary | ICD-10-CM | POA: Diagnosis not present

## 2020-08-01 DIAGNOSIS — D649 Anemia, unspecified: Secondary | ICD-10-CM | POA: Diagnosis not present

## 2020-08-01 DIAGNOSIS — I5032 Chronic diastolic (congestive) heart failure: Secondary | ICD-10-CM | POA: Diagnosis not present

## 2020-08-01 DIAGNOSIS — L89150 Pressure ulcer of sacral region, unstageable: Secondary | ICD-10-CM | POA: Diagnosis not present

## 2020-08-01 DIAGNOSIS — N390 Urinary tract infection, site not specified: Secondary | ICD-10-CM | POA: Diagnosis not present

## 2020-08-01 DIAGNOSIS — G20 Parkinson's disease: Secondary | ICD-10-CM | POA: Diagnosis not present

## 2020-08-01 DIAGNOSIS — B9620 Unspecified Escherichia coli [E. coli] as the cause of diseases classified elsewhere: Secondary | ICD-10-CM | POA: Diagnosis not present

## 2020-08-01 DIAGNOSIS — F39 Unspecified mood [affective] disorder: Secondary | ICD-10-CM | POA: Diagnosis not present

## 2020-08-01 DIAGNOSIS — S32009A Unspecified fracture of unspecified lumbar vertebra, initial encounter for closed fracture: Secondary | ICD-10-CM | POA: Diagnosis not present

## 2020-08-15 DIAGNOSIS — E042 Nontoxic multinodular goiter: Secondary | ICD-10-CM | POA: Diagnosis not present

## 2020-08-15 DIAGNOSIS — E041 Nontoxic single thyroid nodule: Secondary | ICD-10-CM | POA: Diagnosis not present

## 2020-08-17 DIAGNOSIS — E0789 Other specified disorders of thyroid: Secondary | ICD-10-CM | POA: Diagnosis not present

## 2020-08-17 DIAGNOSIS — R4189 Other symptoms and signs involving cognitive functions and awareness: Secondary | ICD-10-CM | POA: Diagnosis not present

## 2020-08-17 DIAGNOSIS — I4891 Unspecified atrial fibrillation: Secondary | ICD-10-CM | POA: Diagnosis not present

## 2020-08-17 DIAGNOSIS — I5032 Chronic diastolic (congestive) heart failure: Secondary | ICD-10-CM | POA: Diagnosis not present

## 2020-08-17 DIAGNOSIS — S32009D Unspecified fracture of unspecified lumbar vertebra, subsequent encounter for fracture with routine healing: Secondary | ICD-10-CM | POA: Diagnosis not present

## 2020-08-17 DIAGNOSIS — G20 Parkinson's disease: Secondary | ICD-10-CM | POA: Diagnosis not present

## 2020-08-17 DIAGNOSIS — F39 Unspecified mood [affective] disorder: Secondary | ICD-10-CM | POA: Diagnosis not present

## 2020-08-22 DIAGNOSIS — D696 Thrombocytopenia, unspecified: Secondary | ICD-10-CM | POA: Diagnosis not present

## 2020-08-22 DIAGNOSIS — D649 Anemia, unspecified: Secondary | ICD-10-CM | POA: Diagnosis not present

## 2020-08-22 DIAGNOSIS — R262 Difficulty in walking, not elsewhere classified: Secondary | ICD-10-CM | POA: Diagnosis not present

## 2020-08-22 DIAGNOSIS — E079 Disorder of thyroid, unspecified: Secondary | ICD-10-CM | POA: Diagnosis not present

## 2020-08-22 DIAGNOSIS — I4891 Unspecified atrial fibrillation: Secondary | ICD-10-CM | POA: Diagnosis not present

## 2020-08-22 DIAGNOSIS — K5901 Slow transit constipation: Secondary | ICD-10-CM | POA: Diagnosis not present

## 2020-08-24 DIAGNOSIS — N390 Urinary tract infection, site not specified: Secondary | ICD-10-CM | POA: Diagnosis not present

## 2020-08-24 DIAGNOSIS — R4189 Other symptoms and signs involving cognitive functions and awareness: Secondary | ICD-10-CM | POA: Diagnosis not present

## 2020-09-05 DIAGNOSIS — I5032 Chronic diastolic (congestive) heart failure: Secondary | ICD-10-CM | POA: Diagnosis not present

## 2020-09-05 DIAGNOSIS — I4891 Unspecified atrial fibrillation: Secondary | ICD-10-CM | POA: Diagnosis not present

## 2020-09-05 DIAGNOSIS — J9601 Acute respiratory failure with hypoxia: Secondary | ICD-10-CM | POA: Diagnosis not present

## 2020-09-07 DIAGNOSIS — J9601 Acute respiratory failure with hypoxia: Secondary | ICD-10-CM | POA: Diagnosis not present

## 2020-09-07 DIAGNOSIS — I5032 Chronic diastolic (congestive) heart failure: Secondary | ICD-10-CM | POA: Diagnosis not present

## 2020-09-12 DIAGNOSIS — I480 Paroxysmal atrial fibrillation: Secondary | ICD-10-CM | POA: Diagnosis not present

## 2020-09-12 DIAGNOSIS — Z7901 Long term (current) use of anticoagulants: Secondary | ICD-10-CM | POA: Diagnosis not present

## 2020-09-13 DIAGNOSIS — F330 Major depressive disorder, recurrent, mild: Secondary | ICD-10-CM | POA: Diagnosis not present

## 2020-09-13 DIAGNOSIS — F411 Generalized anxiety disorder: Secondary | ICD-10-CM | POA: Diagnosis not present

## 2020-09-17 DIAGNOSIS — E041 Nontoxic single thyroid nodule: Secondary | ICD-10-CM | POA: Diagnosis not present

## 2020-09-17 DIAGNOSIS — I5032 Chronic diastolic (congestive) heart failure: Secondary | ICD-10-CM | POA: Diagnosis not present

## 2020-09-17 DIAGNOSIS — D539 Nutritional anemia, unspecified: Secondary | ICD-10-CM | POA: Diagnosis not present

## 2020-09-17 DIAGNOSIS — L89150 Pressure ulcer of sacral region, unstageable: Secondary | ICD-10-CM | POA: Diagnosis not present

## 2020-09-17 DIAGNOSIS — G4733 Obstructive sleep apnea (adult) (pediatric): Secondary | ICD-10-CM | POA: Diagnosis not present

## 2020-09-17 DIAGNOSIS — F39 Unspecified mood [affective] disorder: Secondary | ICD-10-CM | POA: Diagnosis not present

## 2020-09-17 DIAGNOSIS — R911 Solitary pulmonary nodule: Secondary | ICD-10-CM | POA: Diagnosis not present

## 2020-09-17 DIAGNOSIS — I4891 Unspecified atrial fibrillation: Secondary | ICD-10-CM | POA: Diagnosis not present

## 2020-09-17 DIAGNOSIS — R0600 Dyspnea, unspecified: Secondary | ICD-10-CM | POA: Diagnosis not present

## 2020-09-17 DIAGNOSIS — G20 Parkinson's disease: Secondary | ICD-10-CM | POA: Diagnosis not present

## 2020-09-17 DIAGNOSIS — R269 Unspecified abnormalities of gait and mobility: Secondary | ICD-10-CM | POA: Diagnosis not present

## 2020-09-17 DIAGNOSIS — E118 Type 2 diabetes mellitus with unspecified complications: Secondary | ICD-10-CM | POA: Diagnosis not present

## 2020-09-19 DIAGNOSIS — G20 Parkinson's disease: Secondary | ICD-10-CM | POA: Diagnosis not present

## 2020-09-19 DIAGNOSIS — Z7189 Other specified counseling: Secondary | ICD-10-CM | POA: Diagnosis not present

## 2020-09-26 DIAGNOSIS — F5105 Insomnia due to other mental disorder: Secondary | ICD-10-CM | POA: Diagnosis not present

## 2020-09-26 DIAGNOSIS — F321 Major depressive disorder, single episode, moderate: Secondary | ICD-10-CM | POA: Diagnosis not present

## 2020-09-26 DIAGNOSIS — F411 Generalized anxiety disorder: Secondary | ICD-10-CM | POA: Diagnosis not present

## 2020-10-03 DIAGNOSIS — I5032 Chronic diastolic (congestive) heart failure: Secondary | ICD-10-CM | POA: Diagnosis not present

## 2020-10-03 DIAGNOSIS — E118 Type 2 diabetes mellitus with unspecified complications: Secondary | ICD-10-CM | POA: Diagnosis not present

## 2020-10-03 DIAGNOSIS — R635 Abnormal weight gain: Secondary | ICD-10-CM | POA: Diagnosis not present

## 2020-10-04 DIAGNOSIS — F411 Generalized anxiety disorder: Secondary | ICD-10-CM | POA: Diagnosis not present

## 2020-10-04 DIAGNOSIS — F330 Major depressive disorder, recurrent, mild: Secondary | ICD-10-CM | POA: Diagnosis not present

## 2020-10-05 DIAGNOSIS — I5032 Chronic diastolic (congestive) heart failure: Secondary | ICD-10-CM | POA: Diagnosis not present

## 2020-10-05 DIAGNOSIS — R635 Abnormal weight gain: Secondary | ICD-10-CM | POA: Diagnosis not present

## 2020-10-05 DIAGNOSIS — F4321 Adjustment disorder with depressed mood: Secondary | ICD-10-CM | POA: Diagnosis not present

## 2020-10-12 DIAGNOSIS — I5032 Chronic diastolic (congestive) heart failure: Secondary | ICD-10-CM | POA: Diagnosis not present

## 2020-10-19 DIAGNOSIS — I5032 Chronic diastolic (congestive) heart failure: Secondary | ICD-10-CM | POA: Diagnosis not present

## 2020-10-22 DIAGNOSIS — I5032 Chronic diastolic (congestive) heart failure: Secondary | ICD-10-CM | POA: Diagnosis not present

## 2020-10-22 DIAGNOSIS — I4891 Unspecified atrial fibrillation: Secondary | ICD-10-CM | POA: Diagnosis not present

## 2020-10-22 DIAGNOSIS — Z7901 Long term (current) use of anticoagulants: Secondary | ICD-10-CM | POA: Diagnosis not present

## 2020-10-22 DIAGNOSIS — R410 Disorientation, unspecified: Secondary | ICD-10-CM | POA: Diagnosis not present

## 2020-10-24 DIAGNOSIS — F411 Generalized anxiety disorder: Secondary | ICD-10-CM | POA: Diagnosis not present

## 2020-10-24 DIAGNOSIS — F321 Major depressive disorder, single episode, moderate: Secondary | ICD-10-CM | POA: Diagnosis not present

## 2020-10-24 DIAGNOSIS — F5105 Insomnia due to other mental disorder: Secondary | ICD-10-CM | POA: Diagnosis not present

## 2020-10-26 DIAGNOSIS — R410 Disorientation, unspecified: Secondary | ICD-10-CM | POA: Diagnosis not present

## 2020-10-26 DIAGNOSIS — N390 Urinary tract infection, site not specified: Secondary | ICD-10-CM | POA: Diagnosis not present

## 2020-10-29 DIAGNOSIS — I5032 Chronic diastolic (congestive) heart failure: Secondary | ICD-10-CM | POA: Diagnosis not present

## 2020-10-29 DIAGNOSIS — R410 Disorientation, unspecified: Secondary | ICD-10-CM | POA: Diagnosis not present

## 2020-10-29 DIAGNOSIS — N390 Urinary tract infection, site not specified: Secondary | ICD-10-CM | POA: Diagnosis not present

## 2020-11-02 DIAGNOSIS — R4701 Aphasia: Secondary | ICD-10-CM | POA: Diagnosis not present

## 2020-11-02 DIAGNOSIS — E662 Morbid (severe) obesity with alveolar hypoventilation: Secondary | ICD-10-CM | POA: Diagnosis not present

## 2020-11-02 DIAGNOSIS — G20 Parkinson's disease: Secondary | ICD-10-CM | POA: Diagnosis not present

## 2020-11-02 DIAGNOSIS — I5032 Chronic diastolic (congestive) heart failure: Secondary | ICD-10-CM | POA: Diagnosis not present

## 2020-11-02 DIAGNOSIS — R296 Repeated falls: Secondary | ICD-10-CM | POA: Diagnosis not present

## 2020-11-02 DIAGNOSIS — M6281 Muscle weakness (generalized): Secondary | ICD-10-CM | POA: Diagnosis not present

## 2020-11-05 DIAGNOSIS — R4701 Aphasia: Secondary | ICD-10-CM | POA: Diagnosis not present

## 2020-11-05 DIAGNOSIS — M6281 Muscle weakness (generalized): Secondary | ICD-10-CM | POA: Diagnosis not present

## 2020-11-05 DIAGNOSIS — R296 Repeated falls: Secondary | ICD-10-CM | POA: Diagnosis not present

## 2020-11-05 DIAGNOSIS — G20 Parkinson's disease: Secondary | ICD-10-CM | POA: Diagnosis not present

## 2020-11-06 DIAGNOSIS — G20 Parkinson's disease: Secondary | ICD-10-CM | POA: Diagnosis not present

## 2020-11-06 DIAGNOSIS — R4701 Aphasia: Secondary | ICD-10-CM | POA: Diagnosis not present

## 2020-11-06 DIAGNOSIS — M6281 Muscle weakness (generalized): Secondary | ICD-10-CM | POA: Diagnosis not present

## 2020-11-06 DIAGNOSIS — R296 Repeated falls: Secondary | ICD-10-CM | POA: Diagnosis not present

## 2020-11-07 DIAGNOSIS — I10 Essential (primary) hypertension: Secondary | ICD-10-CM | POA: Diagnosis not present

## 2020-11-08 DIAGNOSIS — R4701 Aphasia: Secondary | ICD-10-CM | POA: Diagnosis not present

## 2020-11-08 DIAGNOSIS — M6281 Muscle weakness (generalized): Secondary | ICD-10-CM | POA: Diagnosis not present

## 2020-11-08 DIAGNOSIS — R296 Repeated falls: Secondary | ICD-10-CM | POA: Diagnosis not present

## 2020-11-08 DIAGNOSIS — G20 Parkinson's disease: Secondary | ICD-10-CM | POA: Diagnosis not present

## 2020-11-09 DIAGNOSIS — G4733 Obstructive sleep apnea (adult) (pediatric): Secondary | ICD-10-CM | POA: Diagnosis not present

## 2020-11-09 DIAGNOSIS — R7981 Abnormal blood-gas level: Secondary | ICD-10-CM | POA: Diagnosis not present

## 2020-11-09 DIAGNOSIS — I5032 Chronic diastolic (congestive) heart failure: Secondary | ICD-10-CM | POA: Diagnosis not present

## 2020-11-12 ENCOUNTER — Emergency Department (HOSPITAL_COMMUNITY): Payer: Medicare Other

## 2020-11-12 ENCOUNTER — Other Ambulatory Visit: Payer: Self-pay

## 2020-11-12 ENCOUNTER — Encounter (HOSPITAL_COMMUNITY): Payer: Self-pay | Admitting: Emergency Medicine

## 2020-11-12 ENCOUNTER — Inpatient Hospital Stay (HOSPITAL_COMMUNITY)
Admission: EM | Admit: 2020-11-12 | Discharge: 2020-11-20 | DRG: 871 | Disposition: A | Discharge status: Skilled Nursing Facility | Payer: Medicare Other | Attending: Internal Medicine | Admitting: Internal Medicine

## 2020-11-12 DIAGNOSIS — Z95828 Presence of other vascular implants and grafts: Secondary | ICD-10-CM

## 2020-11-12 DIAGNOSIS — Z794 Long term (current) use of insulin: Secondary | ICD-10-CM

## 2020-11-12 DIAGNOSIS — R0602 Shortness of breath: Secondary | ICD-10-CM | POA: Diagnosis not present

## 2020-11-12 DIAGNOSIS — Z96641 Presence of right artificial hip joint: Secondary | ICD-10-CM | POA: Diagnosis present

## 2020-11-12 DIAGNOSIS — R7981 Abnormal blood-gas level: Secondary | ICD-10-CM | POA: Diagnosis not present

## 2020-11-12 DIAGNOSIS — R0689 Other abnormalities of breathing: Secondary | ICD-10-CM | POA: Diagnosis not present

## 2020-11-12 DIAGNOSIS — F05 Delirium due to known physiological condition: Secondary | ICD-10-CM | POA: Diagnosis not present

## 2020-11-12 DIAGNOSIS — R609 Edema, unspecified: Secondary | ICD-10-CM | POA: Diagnosis not present

## 2020-11-12 DIAGNOSIS — G2581 Restless legs syndrome: Secondary | ICD-10-CM | POA: Diagnosis present

## 2020-11-12 DIAGNOSIS — A4102 Sepsis due to Methicillin resistant Staphylococcus aureus: Secondary | ICD-10-CM | POA: Diagnosis not present

## 2020-11-12 DIAGNOSIS — J189 Pneumonia, unspecified organism: Secondary | ICD-10-CM | POA: Diagnosis present

## 2020-11-12 DIAGNOSIS — E1169 Type 2 diabetes mellitus with other specified complication: Secondary | ICD-10-CM | POA: Diagnosis present

## 2020-11-12 DIAGNOSIS — N390 Urinary tract infection, site not specified: Secondary | ICD-10-CM | POA: Diagnosis present

## 2020-11-12 DIAGNOSIS — G8929 Other chronic pain: Secondary | ICD-10-CM | POA: Diagnosis present

## 2020-11-12 DIAGNOSIS — I4821 Permanent atrial fibrillation: Secondary | ICD-10-CM | POA: Diagnosis present

## 2020-11-12 DIAGNOSIS — L089 Local infection of the skin and subcutaneous tissue, unspecified: Secondary | ICD-10-CM | POA: Diagnosis present

## 2020-11-12 DIAGNOSIS — B9562 Methicillin resistant Staphylococcus aureus infection as the cause of diseases classified elsewhere: Secondary | ICD-10-CM | POA: Diagnosis not present

## 2020-11-12 DIAGNOSIS — J9601 Acute respiratory failure with hypoxia: Secondary | ICD-10-CM | POA: Diagnosis not present

## 2020-11-12 DIAGNOSIS — R404 Transient alteration of awareness: Secondary | ICD-10-CM | POA: Diagnosis not present

## 2020-11-12 DIAGNOSIS — I959 Hypotension, unspecified: Secondary | ICD-10-CM | POA: Diagnosis not present

## 2020-11-12 DIAGNOSIS — Z87440 Personal history of urinary (tract) infections: Secondary | ICD-10-CM

## 2020-11-12 DIAGNOSIS — G4733 Obstructive sleep apnea (adult) (pediatric): Secondary | ICD-10-CM | POA: Diagnosis present

## 2020-11-12 DIAGNOSIS — G9341 Metabolic encephalopathy: Secondary | ICD-10-CM | POA: Diagnosis present

## 2020-11-12 DIAGNOSIS — Z789 Other specified health status: Secondary | ICD-10-CM

## 2020-11-12 DIAGNOSIS — D696 Thrombocytopenia, unspecified: Secondary | ICD-10-CM | POA: Diagnosis present

## 2020-11-12 DIAGNOSIS — Z20822 Contact with and (suspected) exposure to covid-19: Secondary | ICD-10-CM | POA: Diagnosis not present

## 2020-11-12 DIAGNOSIS — D638 Anemia in other chronic diseases classified elsewhere: Secondary | ICD-10-CM | POA: Diagnosis present

## 2020-11-12 DIAGNOSIS — Z8614 Personal history of Methicillin resistant Staphylococcus aureus infection: Secondary | ICD-10-CM

## 2020-11-12 DIAGNOSIS — L89154 Pressure ulcer of sacral region, stage 4: Secondary | ICD-10-CM | POA: Diagnosis present

## 2020-11-12 DIAGNOSIS — R7881 Bacteremia: Secondary | ICD-10-CM

## 2020-11-12 DIAGNOSIS — E119 Type 2 diabetes mellitus without complications: Secondary | ICD-10-CM

## 2020-11-12 DIAGNOSIS — E669 Obesity, unspecified: Secondary | ICD-10-CM | POA: Diagnosis present

## 2020-11-12 DIAGNOSIS — Z66 Do not resuscitate: Secondary | ICD-10-CM | POA: Diagnosis not present

## 2020-11-12 DIAGNOSIS — M25562 Pain in left knee: Secondary | ICD-10-CM

## 2020-11-12 DIAGNOSIS — I445 Left posterior fascicular block: Secondary | ICD-10-CM | POA: Diagnosis present

## 2020-11-12 DIAGNOSIS — Z8249 Family history of ischemic heart disease and other diseases of the circulatory system: Secondary | ICD-10-CM

## 2020-11-12 DIAGNOSIS — Z91030 Bee allergy status: Secondary | ICD-10-CM

## 2020-11-12 DIAGNOSIS — I517 Cardiomegaly: Secondary | ICD-10-CM | POA: Diagnosis not present

## 2020-11-12 DIAGNOSIS — I5033 Acute on chronic diastolic (congestive) heart failure: Secondary | ICD-10-CM | POA: Diagnosis present

## 2020-11-12 DIAGNOSIS — R0902 Hypoxemia: Secondary | ICD-10-CM | POA: Diagnosis not present

## 2020-11-12 DIAGNOSIS — Z7901 Long term (current) use of anticoagulants: Secondary | ICD-10-CM

## 2020-11-12 DIAGNOSIS — I5031 Acute diastolic (congestive) heart failure: Secondary | ICD-10-CM | POA: Diagnosis not present

## 2020-11-12 DIAGNOSIS — F0280 Dementia in other diseases classified elsewhere without behavioral disturbance: Secondary | ICD-10-CM | POA: Diagnosis present

## 2020-11-12 DIAGNOSIS — Z881 Allergy status to other antibiotic agents status: Secondary | ICD-10-CM

## 2020-11-12 DIAGNOSIS — Z6832 Body mass index (BMI) 32.0-32.9, adult: Secondary | ICD-10-CM

## 2020-11-12 DIAGNOSIS — Z806 Family history of leukemia: Secondary | ICD-10-CM

## 2020-11-12 DIAGNOSIS — L89324 Pressure ulcer of left buttock, stage 4: Secondary | ICD-10-CM | POA: Diagnosis present

## 2020-11-12 DIAGNOSIS — G20 Parkinson's disease: Secondary | ICD-10-CM | POA: Diagnosis present

## 2020-11-12 DIAGNOSIS — I10 Essential (primary) hypertension: Secondary | ICD-10-CM | POA: Diagnosis present

## 2020-11-12 DIAGNOSIS — Z79899 Other long term (current) drug therapy: Secondary | ICD-10-CM

## 2020-11-12 DIAGNOSIS — L89212 Pressure ulcer of right hip, stage 2: Secondary | ICD-10-CM | POA: Diagnosis present

## 2020-11-12 DIAGNOSIS — Z888 Allergy status to other drugs, medicaments and biological substances status: Secondary | ICD-10-CM

## 2020-11-12 DIAGNOSIS — I509 Heart failure, unspecified: Secondary | ICD-10-CM

## 2020-11-12 DIAGNOSIS — Z96651 Presence of right artificial knee joint: Secondary | ICD-10-CM | POA: Diagnosis present

## 2020-11-12 DIAGNOSIS — Z96659 Presence of unspecified artificial knee joint: Secondary | ICD-10-CM

## 2020-11-12 DIAGNOSIS — Z7401 Bed confinement status: Secondary | ICD-10-CM

## 2020-11-12 DIAGNOSIS — M797 Fibromyalgia: Secondary | ICD-10-CM | POA: Diagnosis present

## 2020-11-12 DIAGNOSIS — Z7189 Other specified counseling: Secondary | ICD-10-CM

## 2020-11-12 DIAGNOSIS — I110 Hypertensive heart disease with heart failure: Secondary | ICD-10-CM | POA: Diagnosis present

## 2020-11-12 DIAGNOSIS — Z515 Encounter for palliative care: Secondary | ICD-10-CM

## 2020-11-12 DIAGNOSIS — I472 Ventricular tachycardia: Secondary | ICD-10-CM | POA: Diagnosis not present

## 2020-11-12 DIAGNOSIS — I2510 Atherosclerotic heart disease of native coronary artery without angina pectoris: Secondary | ICD-10-CM | POA: Diagnosis present

## 2020-11-12 DIAGNOSIS — R4182 Altered mental status, unspecified: Secondary | ICD-10-CM | POA: Diagnosis not present

## 2020-11-12 HISTORY — DX: Parkinson's disease: G20

## 2020-11-12 HISTORY — DX: Parkinson's disease without dyskinesia, without mention of fluctuations: G20.A1

## 2020-11-12 LAB — CBC WITH DIFFERENTIAL/PLATELET
Abs Immature Granulocytes: 0.03 10*3/uL (ref 0.00–0.07)
Basophils Absolute: 0 10*3/uL (ref 0.0–0.1)
Basophils Relative: 0 %
Eosinophils Absolute: 0.1 10*3/uL (ref 0.0–0.5)
Eosinophils Relative: 1 %
HCT: 36.8 % (ref 36.0–46.0)
Hemoglobin: 10.6 g/dL — ABNORMAL LOW (ref 12.0–15.0)
Immature Granulocytes: 0 %
Lymphocytes Relative: 10 %
Lymphs Abs: 0.8 10*3/uL (ref 0.7–4.0)
MCH: 26.4 pg (ref 26.0–34.0)
MCHC: 28.8 g/dL — ABNORMAL LOW (ref 30.0–36.0)
MCV: 91.5 fL (ref 80.0–100.0)
Monocytes Absolute: 0.8 10*3/uL (ref 0.1–1.0)
Monocytes Relative: 11 %
Neutro Abs: 5.8 10*3/uL (ref 1.7–7.7)
Neutrophils Relative %: 78 %
Platelets: 125 10*3/uL — ABNORMAL LOW (ref 150–400)
RBC: 4.02 MIL/uL (ref 3.87–5.11)
RDW: 17.3 % — ABNORMAL HIGH (ref 11.5–15.5)
WBC: 7.5 10*3/uL (ref 4.0–10.5)
nRBC: 0 % (ref 0.0–0.2)

## 2020-11-12 LAB — I-STAT VENOUS BLOOD GAS, ED
Acid-Base Excess: 17 mmol/L — ABNORMAL HIGH (ref 0.0–2.0)
Bicarbonate: 44.8 mmol/L — ABNORMAL HIGH (ref 20.0–28.0)
Calcium, Ion: 1.11 mmol/L — ABNORMAL LOW (ref 1.15–1.40)
HCT: 37 % (ref 36.0–46.0)
Hemoglobin: 12.6 g/dL (ref 12.0–15.0)
O2 Saturation: 96 %
Potassium: 3.3 mmol/L — ABNORMAL LOW (ref 3.5–5.1)
Sodium: 143 mmol/L (ref 135–145)
TCO2: 47 mmol/L — ABNORMAL HIGH (ref 22–32)
pCO2, Ven: 67.1 mmHg — ABNORMAL HIGH (ref 44.0–60.0)
pH, Ven: 7.433 — ABNORMAL HIGH (ref 7.250–7.430)
pO2, Ven: 84 mmHg — ABNORMAL HIGH (ref 32.0–45.0)

## 2020-11-12 LAB — COMPREHENSIVE METABOLIC PANEL
ALT: 10 U/L (ref 0–44)
AST: 20 U/L (ref 15–41)
Albumin: 2.8 g/dL — ABNORMAL LOW (ref 3.5–5.0)
Alkaline Phosphatase: 75 U/L (ref 38–126)
Anion gap: 10 (ref 5–15)
BUN: 14 mg/dL (ref 8–23)
CO2: 41 mmol/L — ABNORMAL HIGH (ref 22–32)
Calcium: 8.3 mg/dL — ABNORMAL LOW (ref 8.9–10.3)
Chloride: 88 mmol/L — ABNORMAL LOW (ref 98–111)
Creatinine, Ser: 0.52 mg/dL (ref 0.44–1.00)
GFR, Estimated: >60 mL/min (ref >60)
Glucose, Bld: 138 mg/dL — ABNORMAL HIGH (ref 70–99)
Potassium: 3.5 mmol/L (ref 3.5–5.1)
Sodium: 139 mmol/L (ref 135–145)
Total Bilirubin: 1.5 mg/dL — ABNORMAL HIGH (ref 0.3–1.2)
Total Protein: 5.9 g/dL — ABNORMAL LOW (ref 6.5–8.1)

## 2020-11-12 LAB — URINALYSIS, ROUTINE W REFLEX MICROSCOPIC
Bilirubin Urine: NEGATIVE
Glucose, UA: NEGATIVE mg/dL
Ketones, ur: NEGATIVE mg/dL
Nitrite: NEGATIVE
Protein, ur: NEGATIVE mg/dL
Specific Gravity, Urine: 1.008 (ref 1.005–1.030)
pH: 7 (ref 5.0–8.0)

## 2020-11-12 LAB — APTT: aPTT: 31 seconds (ref 24–36)

## 2020-11-12 LAB — RESP PANEL BY RT-PCR (FLU A&B, COVID) ARPGX2
Influenza A by PCR: NEGATIVE
Influenza B by PCR: NEGATIVE
SARS Coronavirus 2 by RT PCR: NEGATIVE

## 2020-11-12 LAB — LACTIC ACID, PLASMA: Lactic Acid, Venous: 1.2 mmol/L (ref 0.5–1.9)

## 2020-11-12 LAB — PROTIME-INR
INR: 1.8 — ABNORMAL HIGH (ref 0.8–1.2)
Prothrombin Time: 20.7 seconds — ABNORMAL HIGH (ref 11.4–15.2)

## 2020-11-12 LAB — MAGNESIUM: Magnesium: 1.6 mg/dL — ABNORMAL LOW (ref 1.7–2.4)

## 2020-11-12 LAB — BRAIN NATRIURETIC PEPTIDE: B Natriuretic Peptide: 412.3 pg/mL — ABNORMAL HIGH (ref 0.0–100.0)

## 2020-11-12 MED ORDER — LEVOFLOXACIN IN D5W 750 MG/150ML IV SOLN
750.0000 mg | Freq: Once | INTRAVENOUS | Status: AC
  Administered 2020-11-12: 750 mg via INTRAVENOUS
  Filled 2020-11-12: qty 150

## 2020-11-12 MED ORDER — LEVOFLOXACIN IN D5W 750 MG/150ML IV SOLN
750.0000 mg | INTRAVENOUS | Status: DC
  Administered 2020-11-13: 750 mg via INTRAVENOUS
  Filled 2020-11-12 (×2): qty 150

## 2020-11-12 MED ORDER — FUROSEMIDE 10 MG/ML IJ SOLN
20.0000 mg | Freq: Once | INTRAMUSCULAR | Status: AC
  Administered 2020-11-12: 20 mg via INTRAVENOUS
  Filled 2020-11-12: qty 2

## 2020-11-12 NOTE — Progress Notes (Signed)
Pharmacy Antibiotic Note  CANNIE WAGUESPACK is a 70 y.o. female admitted on 11/12/2020 with pneumonia.  Pharmacy has been consulted for Levaquin dosing. SCr wnl, WBC wnl   Plan: -Levaquin 750 mg IV Q 24 hours -Monitor CBC, renal fx, cultures and clinical progress  Height: '5\' 10"'$  (177.8 cm) Weight: 104 kg (229 lb 4.5 oz) IBW/kg (Calculated) : 68.5  Temp (24hrs), Avg:100 F (37.8 C), Min:100 F (37.8 C), Max:100 F (37.8 C)  Recent Labs  Lab 11/12/20 1500  LATICACIDVEN 1.2    CrCl cannot be calculated (Patient's most recent lab result is older than the maximum 21 days allowed.).    Allergies  Allergen Reactions   Bee Venom Hives    Takes benadryl   Other Other (See Comments)    Raw foods with seeds give her "boils".  Avoids raw strawberries, blueberries. Tolerates cooked fruits, tomato sauce, bread/grains with seeds. [facility] 10/17/13 Berries with seeds Allergy to glutamine-C-quercet-selen-brom per [facility] 09/25/17   Zosyn [Piperacillin Sod-Tazobactam So] Rash    Itchy total body rash.   Alteplase Rash   Cefepime Rash   Fentanyl Other (See Comments)    Hallucinations, syncope     Metformin And Related Diarrhea   Vancomycin Rash    Antimicrobials this admission: Levaquin 9/12 >>   Dose adjustments this admission:   Microbiology results: 9/12 BCx:  9/12 UCx:     Thank you for allowing pharmacy to be a part of this patient's care.  Albertina Parr, PharmD., BCPS, BCCCP Clinical Pharmacist Please refer to [facility] for unit-specific pharmacist

## 2020-11-12 NOTE — Sepsis Progress Note (Signed)
ELink monitoring sepsis protocol 

## 2020-11-12 NOTE — ED Provider Notes (Signed)
  Physical Exam  BP 111/71   Pulse 87   Temp 100 F (37.8 C) (Rectal)   Resp 14   Ht '5\' 10"'$  (1.778 m)   Wt 104 kg   SpO2 96%   BMI 32.90 kg/m   Physical Exam  ED Course/Procedures     Procedures  MDM  Received patient in signout.  Mental status change.  Volume overload.  Had previously been treated for UTI.  Urine does have some leukocytes and white cells but only rare bacteria.  Culture pending.  Volume overload on x-ray and lab work.  Had been given Lasix.  Respiratory status improved some.  Will admit to unassigned medicine.       Davonna Belling, MD 11/12/20 2257

## 2020-11-12 NOTE — ED Provider Notes (Signed)
[facility] EMERGENCY DEPARTMENT Provider Note   CSN: [id number] Arrival date & time: 11/12/20  1415     History Chief Complaint  Patient presents with   Altered Mental Status    Donna [name] is a 70 y.o. female.  Patient with history of CHF, A. fib, diabetes presents today with altered mental status.  Of note, was recently started on antibiotics for urinary tract infection. According to EMS, nursing facility states that mentation has worsened in the last 2 days. Has a baseline oxygen requirement of 3L, is now on 6L [place].   Level 5 caveat- AMS.   The history is provided by the EMS personnel. No language interpreter was used.  Altered Mental Status     Past Medical History:  Diagnosis Date   CHF (congestive heart failure) (HCC)    Chronic /permanent atrial fibrillation ([place]) 2005   On Warfarin (follwed @ [facility]); Rate controlled - On BB   Chronic diastolic heart failure, NYHA class 2 ([place])    Updated by A. fib, hypertensive heart disease and obesity; EDP was only 7 by cardiac catheterization in September 2017   Diabetic lumbosacral plexopathy (HCC)    Fibromyalgia    Generalized anxiety disorder    Hypokalemia 09/03/2017   Incontinence    Major depressive disorder    OSA on CPAP    Restless leg syndrome    Syncope 09/03/2017   Type II diabetes mellitus ([place])     Patient Active Problem List   Diagnosis Date Noted   Rash and nonspecific skin eruption    Hypersensitivity 04/09/2019   Sacral osteomyelitis ([place]) 04/08/2019   Allergic urticaria 04/08/2019   Orthostatic hypotension    Decubitus ulcer of ischium, left, stage IV ([place]) 09/28/2017   Atrial fibrillation with RVR ([place]) 09/25/2017   Normocytic anemia 09/25/2017   Type 2 diabetes mellitus ([place]) 09/25/2017   Chronic pain 09/25/2017   History of seizures 09/03/2017   Depression with anxiety 02/09/2017   Venous stasis of both lower extremities 07/25/2016   Essential  hypertension 07/25/2016   Anticoagulant long-term use 07/15/2016   Anatomical narrow angle borderline glaucoma of both eyes 04/02/2016   Chronic diastolic CHF (congestive heart failure) ([place]) 02/08/2016   Permanent atrial fibrillation ([place]): CHA2DS2-VASc Score 5; on Warfarin 02/08/2016   Morbid obesity ([place]) 02/08/2016   OSA (obstructive sleep apnea) 02/08/2016   Fibromyalgia 08/07/2012   Restless legs syndrome 08/07/2012    Past Surgical History:  Procedure Laterality Date   CARDIAC CATHETERIZATION  11/09/2015   [facility]: Nonocclusive CAD. EF 55%. EDP 7 mmHg.   CHOLECYSTECTOMY OPEN     JOINT REPLACEMENT     KNEE ARTHROSCOPY Right    MASS EXCISION Left    sarcoma - shoulder   SHOULDER OPEN ROTATOR CUFF REPAIR Bilateral    TONSILLECTOMY     TOTAL HIP ARTHROPLASTY Right 2015   TOTAL HIP REVISION Right 2015   TOTAL KNEE ARTHROPLASTY Right    TRANSTHORACIC ECHOCARDIOGRAM  11/2013; 02/2016   a) [facility]: with Definity:  EF 60-65%. Mod LA dilation. Mild AoV sclerosis. Normal RHP. Indeterminate LV filling pressures. b) [facility]: Normal cavity size. EF 55-60%.no RWMA, mild AoV sclerosis (R cusp), Mod MAC. Mild biAtrial dilation.   TRANSTHORACIC ECHOCARDIOGRAM  08/2017    Mild LVH with normal EF 55-60%.  Unable to fully assess wall motion, but appear to be normal.  Unable to determine diastolic function because of A. fib.  Mild to moderate MAC  with mild mitral stenosis (mean gradient 9 mmHg).  Moderate LA dilation.  CVP estimated 3 mmHg.  No obvious evidence of pulmonary hypertension.   VAGINAL HYSTERECTOMY       OB History   No obstetric history on file.     Family History  Problem Relation Age of Onset   Hypertension Mother    Heart disease Mother    Leukemia Son     Social History   Tobacco Use   Smoking status: Never   Smokeless tobacco: Never  Vaping Use   Vaping Use: Never used  Substance Use Topics   Alcohol use: No   Drug use: No    Home  Medications Prior to Admission medications   Medication Sig Start Date End Date Taking? Authorizing Provider  acetaminophen (TYLENOL) 500 MG tablet Take 1,000 mg by mouth 3 (three) times daily.     [provider]  ARIPiprazole (ABILIFY) 10 MG tablet Take 10 mg by mouth daily.    [provider]  docusate sodium (COLACE) 100 MG capsule Take 100 mg by mouth daily as needed for mild constipation.    [provider]  Dulaglutide (TRULICITY) 1.5 EX/9.3ZJ SOPN Inject 1.5 mg into the skin every Saturday.     [provider]  ezetimibe (ZETIA) 10 MG tablet Take 10 mg by mouth at bedtime.     [provider]  ferrous sulfate 325 (65 FE) MG tablet Take 325 mg by mouth daily with breakfast.     [provider]  furosemide (LASIX) 20 MG tablet Take 1 tablet (20 mg total) by mouth daily. 12/15/19   Lendon Colonel, NP  gabapentin (NEURONTIN) 300 MG capsule Take 600 mg by mouth 3 (three) times daily.    [provider]  Insulin Glargine (BASAGLAR KWIKPEN) 100 UNIT/ML SOPN Inject 0.25 mLs (25 Units total) into the skin at bedtime. 04/07/19   Johnson, Clanford L, MD  insulin regular (NOVOLIN R) 100 units/mL injection Inject 0-12 Units into the skin 3 (three) times daily before meals. 0-200 0 units 201-250 6 units 251-300 8 units 301-350 10 units 351-400 12 units Greater than 400 call MD    [provider]  lidocaine (LIDODERM) 5 % Place 1 patch onto the skin daily as needed (right hip pain). Remove & Discard patch within 12 hours or as directed by MD     [provider]  loperamide (IMODIUM A-D) 2 MG tablet Take 2 mg by mouth 4 (four) times daily as needed for diarrhea or loose stools.    [provider]  loratadine (CLARITIN) 10 MG tablet Take 1 tablet (10 mg total) by mouth daily. 04/14/19   Shelda Pal, DO  magnesium oxide (MAG-OX) 400 MG tablet Take 1 tablet (400 mg total) by mouth 3 (three) times daily.  ALSO TAKE A EXTRA 400 MG TABLET ON THE DAYS YOU TAKE ZAROXOLYN TABLET Patient taking differently: Take 400 mg by mouth 3 (three) times daily.  02/02/18   Leonie Man, MD  metoprolol succinate (TOPROL-XL) 25 MG 24 hr tablet Take 1 tablet (25 mg total) by mouth daily. 12/15/19   Lendon Colonel, NP  omeprazole (PRILOSEC) 20 MG capsule Take 20 mg by mouth daily as needed (acid reflux/indigestion).     [provider]  oxyCODONE (OXY IR/ROXICODONE) 5 MG immediate release tablet Take 1 tablet (5 mg total) by mouth every 6 (six) hours as needed for severe pain. 04/07/19   Murlean Iba, MD  potassium chloride SA (KLOR-CON) 20 MEQ tablet Take 1 tablet (20 mEq total) by mouth daily. TAKE AN EXTRA 20 MEQ ON THE DAYS YOU TAKE ZAROXLYN 01/05/20   Leonie Man, MD  pramipexole (MIRAPEX) 0.125 MG tablet Take 0.125 mg by mouth at bedtime.     [provider]  sertraline (ZOLOFT) 100 MG tablet Take 100 mg by mouth at bedtime.     [provider]  warfarin (COUMADIN) 1 MG tablet Take 1 mg by mouth See admin instructions. Take as directed by MD per weekly levels    [provider]  warfarin (JANTOVEN) 2 MG tablet Take 2 mg by mouth See admin instructions. Take as directed by MD per weekly levels    [provider]  warfarin (JANTOVEN) 5 MG tablet Take 5 mg by mouth See admin instructions. Take as directed by MD per weekly levels    [provider]    Allergies    Bee venom, Other, Zosyn [piperacillin sod-tazobactam so], Alteplase, Cefepime, Fentanyl, Metformin and related, and Vancomycin  Review of Systems   Review of Systems  Unable to perform ROS: Mental status change   Physical Exam Updated Vital Signs BP 126/67   Pulse 83   Temp 100 F (37.8 C) (Rectal)   Resp 20   Ht _0  (1.778 m)   Wt 104 kg   SpO2 100%   BMI 32.90 kg/m   Physical Exam Vitals and nursing note reviewed.  Constitutional:      General: She is in acute  distress.     Appearance: She is obese. She is ill-appearing, toxic-appearing and diaphoretic.  HENT:     Head: Normocephalic.     Mouth/Throat:     Mouth: Mucous membranes are moist.  Eyes:     General: No scleral icterus. Cardiovascular:     Rate and Rhythm: Normal rate and regular rhythm.     Pulses: Normal pulses.     Heart sounds: Normal heart sounds. No murmur heard. Pulmonary:     Effort: Respiratory distress present.     Breath sounds: No stridor. Rales present. No wheezing.     Comments: Crackles noted in bilateral lower lobes Chest:     Chest wall: No tenderness.  Abdominal:     General: There is distension.     Palpations: Abdomen is soft.  Musculoskeletal:     Cervical back: Normal range of motion.     Right lower leg: Edema present.     Left lower leg: Edema present.     Comments: 4+ noted in bilateral lower extremities  Skin:    General: Skin is warm.  Neurological:     Mental Status: She is disoriented.     Comments: Not following instructions, therefore limited neuro exam    ED Results / Procedures / Treatments   Labs (all labs ordered are listed, but only abnormal results are displayed) Labs Reviewed  I-STAT VENOUS BLOOD GAS, ED - Abnormal; Notable for the following components:      Result Value   pH, Ven 7.433 (*)    pCO2, Ven 67.1 (*)    pO2, Ven 84.0 (*)    Bicarbonate 44.8 (*)    TCO2 47 (*)    Acid-Base Excess 17.0 (*)    Potassium 3.3 (*)    Calcium, Ion 1.11 (*)    All other components within normal limits  RESP PANEL BY RT-PCR (FLU A&B, COVID) ARPGX2  CULTURE, BLOOD (ROUTINE X 2)  CULTURE, BLOOD (  ROUTINE X 2)  URINE CULTURE  BRAIN NATRIURETIC PEPTIDE  LACTIC ACID, PLASMA  LACTIC ACID, PLASMA  COMPREHENSIVE METABOLIC PANEL  CBC WITH DIFFERENTIAL/PLATELET  PROTIME-INR  APTT  URINALYSIS, ROUTINE W REFLEX MICROSCOPIC  BASIC METABOLIC PANEL  MAGNESIUM  MISCELLANEOUS GENETIC TEST    EKG EKG Interpretation  Date/Time:  Monday  November 12 2020 14:37:29 EDT Ventricular Rate:  89 PR Interval:    QRS Duration: 98 QT Interval:  374 QTC Calculation: 456 R Axis:   141 Text Interpretation: Atrial fibrillation Left posterior fascicular block Borderline low voltage, extremity leads Consider anterior infarct Nonspecific T abnormalities, lateral leads Since last tracing rate slower Confirmed by Noemi Chapel [phone]) on 11/12/2020 2:53:51 PM  Radiology No results found.  Procedures Procedures   Medications Ordered in ED Medications  levofloxacin (LEVAQUIN) IVPB 750 mg (750 mg Intravenous New Bag/Given 11/12/20 1528)  furosemide (LASIX) injection 20 mg (20 mg Intravenous Given 11/12/20 1530)    ED Course  I have reviewed the triage vital signs and the nursing notes.  Pertinent labs & imaging results that were available during my care of the patient were reviewed by me and considered in my medical decision making (see chart for details).    MDM Rules/Calculators/A&P                         Patient presents today with altered mental status and hypoxia.  Recently treated for urinary tract infection.  Patient initially moderately hypertensive with 532 systolics and oral temperature at 100 degrees combined with recent known urinary tract infection, therefore sepsis workup initiated.  Patient also markedly fluid overloaded and hypoxic, Lasix given.  Labs and imaging pending upon shift change.   The differential diagnosis for AMS is extensive and includes, but is not limited to: drug overdose - opioids, alcohol, sedatives, antipsychotics, drug withdrawal, others; Metabolic: hypoxia, hypoglycemia, hyperglycemia, hypercalcemia, hypernatremia, hyponatremia, uremia, hepatic encephalopathy, hypothyroidism, hyperthyroidism, vitamin B12 or thiamine deficiency, carbon monoxide poisoning, Wilson's disease, Lactic acidosis, Infectious: meningitis, encephalitis, bacteremia/sepsis, urinary tract infection, pneumonia, neurosyphilis;  Structural: Space-occupying lesion, (brain tumor, subdural hematoma, hydrocephalus,); Vascular: stroke, subarachnoid hemorrhage, coronary ischemia, hypertensive encephalopathy, CNS vasculitis, thrombotic thrombocytopenic purpura, disseminated intravascular coagulation, hyperviscosity; Psychiatric: Schizophrenia, depression; Other: Seizure, hypothermia, heat stroke, ICU psychosis, dementia -"sundowning."  Patient unable to perform ROS or thorough physical exam to narrow differential due to AMS, will begin workup for sepsis with CHF exacerbation and reassess.  Care assumed by Dr. Alvino Chapel, MD at shift change    Final Clinical Impression(s) / ED Diagnoses Final diagnoses:  None    Rx / DC Orders ED Discharge Orders     None        Nestor Lewandowsky 11/12/20 1623    Noemi Chapel, MD 11/16/20 [phone]

## 2020-11-12 NOTE — ED Provider Notes (Signed)
Medical screening examination/treatment/procedure(s) were conducted as a shared visit with non-physician practitioner(s) and myself.  I personally evaluated the patient during the encounter.  Clinical Impression:   Final diagnoses:  None    Hx: This patient is a 70 year old female, history of Parkinson's disease, recent history of urinary tract infection in fact going back over the last several months she has had another urine infection in May but according to the paramedic was recently treated for urinary infection within the last 2 weeks.  She presents to the hospital with altered mental status, this evidently started in the last day or 2.  It has been progressive, she required more oxygen today, baseline of 3 L now on 6 L, the patient denies nausea vomiting or diarrhea, she continues to say over and over I have a urinary infection.  Level 5 caveat applies secondary to altered mental status.  PMH:  Parkinson's, UTI, DM  ROS:  Unobtainable due to A[place]red MS  PE:  somnolent but arousable -  Lungs at the bases Mild tachypnea Diffuse edema up the thighs. - anasarca Soft non tender abd.  A/P - AMS - hypoxic and fluid overloaded Needs thorough work-up including sepsis work-up,, rectal temperature, chest x-ray, sepsis protocol as well as a BNP may need diuresis, ABG to rule out hypercapnia.  The patient is somnolent hypoxic and ill-appearing.  At change of shift - care signed out to Dr. Karl Ito, MD 11/16/20 [phone]

## 2020-11-12 NOTE — ED Triage Notes (Addendum)
Pt arrived via [facility] from [place] for AMS that started today. Per staff pt has increased generalized edemax2 wks. Per EMS pt lungs are diminished mid-lower lobes bilat. Pt is orientedx1 to self.

## 2020-11-12 NOTE — ED Notes (Signed)
Light green and blue top lab redrawn and sent down

## 2020-11-13 ENCOUNTER — Telehealth: Payer: Self-pay | Admitting: Cardiology

## 2020-11-13 ENCOUNTER — Inpatient Hospital Stay (HOSPITAL_COMMUNITY): Payer: Medicare Other

## 2020-11-13 ENCOUNTER — Other Ambulatory Visit: Payer: Self-pay

## 2020-11-13 ENCOUNTER — Encounter (HOSPITAL_COMMUNITY): Payer: Self-pay | Admitting: Internal Medicine

## 2020-11-13 DIAGNOSIS — I5033 Acute on chronic diastolic (congestive) heart failure: Secondary | ICD-10-CM

## 2020-11-13 DIAGNOSIS — G20 Parkinson's disease: Secondary | ICD-10-CM | POA: Diagnosis present

## 2020-11-13 DIAGNOSIS — D696 Thrombocytopenia, unspecified: Secondary | ICD-10-CM | POA: Diagnosis not present

## 2020-11-13 DIAGNOSIS — G2581 Restless legs syndrome: Secondary | ICD-10-CM | POA: Diagnosis present

## 2020-11-13 DIAGNOSIS — E669 Obesity, unspecified: Secondary | ICD-10-CM | POA: Diagnosis present

## 2020-11-13 DIAGNOSIS — Z96651 Presence of right artificial knee joint: Secondary | ICD-10-CM | POA: Diagnosis not present

## 2020-11-13 DIAGNOSIS — I5031 Acute diastolic (congestive) heart failure: Secondary | ICD-10-CM | POA: Diagnosis not present

## 2020-11-13 DIAGNOSIS — L89324 Pressure ulcer of left buttock, stage 4: Secondary | ICD-10-CM | POA: Diagnosis not present

## 2020-11-13 DIAGNOSIS — R4182 Altered mental status, unspecified: Secondary | ICD-10-CM | POA: Diagnosis not present

## 2020-11-13 DIAGNOSIS — M25562 Pain in left knee: Secondary | ICD-10-CM | POA: Diagnosis not present

## 2020-11-13 DIAGNOSIS — J9601 Acute respiratory failure with hypoxia: Secondary | ICD-10-CM | POA: Diagnosis not present

## 2020-11-13 DIAGNOSIS — S8002XA Contusion of left knee, initial encounter: Secondary | ICD-10-CM | POA: Diagnosis not present

## 2020-11-13 DIAGNOSIS — J189 Pneumonia, unspecified organism: Secondary | ICD-10-CM | POA: Diagnosis not present

## 2020-11-13 DIAGNOSIS — Z66 Do not resuscitate: Secondary | ICD-10-CM | POA: Diagnosis not present

## 2020-11-13 DIAGNOSIS — I110 Hypertensive heart disease with heart failure: Secondary | ICD-10-CM | POA: Diagnosis not present

## 2020-11-13 DIAGNOSIS — I517 Cardiomegaly: Secondary | ICD-10-CM | POA: Diagnosis not present

## 2020-11-13 DIAGNOSIS — I509 Heart failure, unspecified: Secondary | ICD-10-CM

## 2020-11-13 DIAGNOSIS — R7881 Bacteremia: Secondary | ICD-10-CM | POA: Diagnosis not present

## 2020-11-13 DIAGNOSIS — Z7401 Bed confinement status: Secondary | ICD-10-CM | POA: Diagnosis not present

## 2020-11-13 DIAGNOSIS — Z789 Other specified health status: Secondary | ICD-10-CM | POA: Diagnosis not present

## 2020-11-13 DIAGNOSIS — F0280 Dementia in other diseases classified elsewhere without behavioral disturbance: Secondary | ICD-10-CM | POA: Diagnosis present

## 2020-11-13 DIAGNOSIS — A4102 Sepsis due to Methicillin resistant Staphylococcus aureus: Secondary | ICD-10-CM | POA: Diagnosis not present

## 2020-11-13 DIAGNOSIS — R1909 Other intra-abdominal and pelvic swelling, mass and lump: Secondary | ICD-10-CM | POA: Diagnosis not present

## 2020-11-13 DIAGNOSIS — L89154 Pressure ulcer of sacral region, stage 4: Secondary | ICD-10-CM | POA: Diagnosis not present

## 2020-11-13 DIAGNOSIS — Z6832 Body mass index (BMI) 32.0-32.9, adult: Secondary | ICD-10-CM | POA: Diagnosis not present

## 2020-11-13 DIAGNOSIS — J811 Chronic pulmonary edema: Secondary | ICD-10-CM | POA: Diagnosis not present

## 2020-11-13 DIAGNOSIS — Z7189 Other specified counseling: Secondary | ICD-10-CM | POA: Diagnosis not present

## 2020-11-13 DIAGNOSIS — M25461 Effusion, right knee: Secondary | ICD-10-CM | POA: Diagnosis not present

## 2020-11-13 DIAGNOSIS — Z515 Encounter for palliative care: Secondary | ICD-10-CM | POA: Diagnosis not present

## 2020-11-13 DIAGNOSIS — R0602 Shortness of breath: Secondary | ICD-10-CM | POA: Diagnosis not present

## 2020-11-13 DIAGNOSIS — D638 Anemia in other chronic diseases classified elsewhere: Secondary | ICD-10-CM | POA: Diagnosis present

## 2020-11-13 DIAGNOSIS — Z90710 Acquired absence of both cervix and uterus: Secondary | ICD-10-CM | POA: Diagnosis not present

## 2020-11-13 DIAGNOSIS — L89159 Pressure ulcer of sacral region, unspecified stage: Secondary | ICD-10-CM | POA: Diagnosis not present

## 2020-11-13 DIAGNOSIS — L89212 Pressure ulcer of right hip, stage 2: Secondary | ICD-10-CM | POA: Diagnosis present

## 2020-11-13 DIAGNOSIS — E1169 Type 2 diabetes mellitus with other specified complication: Secondary | ICD-10-CM | POA: Diagnosis not present

## 2020-11-13 DIAGNOSIS — Z20822 Contact with and (suspected) exposure to covid-19: Secondary | ICD-10-CM | POA: Diagnosis not present

## 2020-11-13 DIAGNOSIS — G9341 Metabolic encephalopathy: Secondary | ICD-10-CM | POA: Diagnosis not present

## 2020-11-13 DIAGNOSIS — B9562 Methicillin resistant Staphylococcus aureus infection as the cause of diseases classified elsewhere: Secondary | ICD-10-CM | POA: Diagnosis not present

## 2020-11-13 DIAGNOSIS — M25462 Effusion, left knee: Secondary | ICD-10-CM | POA: Diagnosis not present

## 2020-11-13 DIAGNOSIS — I4821 Permanent atrial fibrillation: Secondary | ICD-10-CM | POA: Diagnosis present

## 2020-11-13 DIAGNOSIS — F05 Delirium due to known physiological condition: Secondary | ICD-10-CM | POA: Diagnosis not present

## 2020-11-13 DIAGNOSIS — N390 Urinary tract infection, site not specified: Secondary | ICD-10-CM | POA: Diagnosis present

## 2020-11-13 DIAGNOSIS — Z471 Aftercare following joint replacement surgery: Secondary | ICD-10-CM | POA: Diagnosis not present

## 2020-11-13 DIAGNOSIS — J90 Pleural effusion, not elsewhere classified: Secondary | ICD-10-CM | POA: Diagnosis not present

## 2020-11-13 DIAGNOSIS — I472 Ventricular tachycardia: Secondary | ICD-10-CM | POA: Diagnosis not present

## 2020-11-13 LAB — GLUCOSE, CAPILLARY
Glucose-Capillary: 167 mg/dL — ABNORMAL HIGH (ref 70–99)
Glucose-Capillary: 171 mg/dL — ABNORMAL HIGH (ref 70–99)
Glucose-Capillary: 145 mg/dL — ABNORMAL HIGH (ref 70–99)
Glucose-Capillary: 164 mg/dL — ABNORMAL HIGH (ref 70–99)

## 2020-11-13 LAB — BASIC METABOLIC PANEL
Anion gap: 7 (ref 5–15)
BUN: 14 mg/dL (ref 8–23)
CO2: 40 mmol/L — ABNORMAL HIGH (ref 22–32)
Calcium: 8.5 mg/dL — ABNORMAL LOW (ref 8.9–10.3)
Chloride: 93 mmol/L — ABNORMAL LOW (ref 98–111)
Creatinine, Ser: 0.52 mg/dL (ref 0.44–1.00)
GFR, Estimated: >60 mL/min (ref >60)
Glucose, Bld: 143 mg/dL — ABNORMAL HIGH (ref 70–99)
Potassium: 3.6 mmol/L (ref 3.5–5.1)
Sodium: 140 mmol/L (ref 135–145)

## 2020-11-13 LAB — CBC
HCT: 36.6 % (ref 36.0–46.0)
Hemoglobin: 10.5 g/dL — ABNORMAL LOW (ref 12.0–15.0)
MCH: 26.3 pg (ref 26.0–34.0)
MCHC: 28.7 g/dL — ABNORMAL LOW (ref 30.0–36.0)
MCV: 91.5 fL (ref 80.0–100.0)
Platelets: 126 10*3/uL — ABNORMAL LOW (ref 150–400)
RBC: 4 MIL/uL (ref 3.87–5.11)
RDW: 17.4 % — ABNORMAL HIGH (ref 11.5–15.5)
WBC: 6.2 10*3/uL (ref 4.0–10.5)
nRBC: 0 % (ref 0.0–0.2)

## 2020-11-13 LAB — PROCALCITONIN: Procalcitonin: <0.1 ng/mL

## 2020-11-13 LAB — PROTIME-INR
INR: 1.7 — ABNORMAL HIGH (ref 0.8–1.2)
Prothrombin Time: 19.6 seconds — ABNORMAL HIGH (ref 11.4–15.2)

## 2020-11-13 LAB — HIV ANTIBODY (ROUTINE TESTING W REFLEX): HIV Screen 4th Generation wRfx: NONREACTIVE

## 2020-11-13 LAB — MAGNESIUM: Magnesium: 1.9 mg/dL (ref 1.7–2.4)

## 2020-11-13 LAB — CARBAMAZEPINE LEVEL, TOTAL: Carbamazepine Lvl: 2.1 ug/mL — ABNORMAL LOW (ref 4.0–12.0)

## 2020-11-13 MED ORDER — ONDANSETRON HCL 4 MG/2ML IJ SOLN
4.0000 mg | Freq: Three times a day (TID) | INTRAMUSCULAR | Status: DC | PRN
  Administered 2020-11-13: 4 mg via INTRAVENOUS
  Filled 2020-11-13: qty 2

## 2020-11-13 MED ORDER — ACETAMINOPHEN 325 MG PO TABS
650.0000 mg | ORAL_TABLET | Freq: Four times a day (QID) | ORAL | Status: DC | PRN
  Administered 2020-11-16 – 2020-11-17 (×3): 650 mg via ORAL
  Filled 2020-11-13 (×3): qty 2

## 2020-11-13 MED ORDER — MAGNESIUM OXIDE -MG SUPPLEMENT 400 (240 MG) MG PO TABS
400.0000 mg | ORAL_TABLET | Freq: Two times a day (BID) | ORAL | Status: DC
  Administered 2020-11-13 – 2020-11-16 (×8): 400 mg via ORAL
  Filled 2020-11-13 (×8): qty 1

## 2020-11-13 MED ORDER — WARFARIN - PHARMACIST DOSING INPATIENT: Freq: Every day | Status: DC

## 2020-11-13 MED ORDER — WARFARIN SODIUM 2.5 MG PO TABS
12.5000 mg | ORAL_TABLET | Freq: Once | ORAL | Status: AC
  Administered 2020-11-13: 12.5 mg via ORAL
  Filled 2020-11-13: qty 1

## 2020-11-13 MED ORDER — LOSARTAN POTASSIUM 25 MG PO TABS
12.5000 mg | ORAL_TABLET | Freq: Every day | ORAL | Status: DC
  Administered 2020-11-13 – 2020-11-20 (×8): 12.5 mg via ORAL
  Filled 2020-11-13 (×6): qty 1
  Filled 2020-11-13: qty 0.5
  Filled 2020-11-13 (×2): qty 1

## 2020-11-13 MED ORDER — PROPRANOLOL HCL 40 MG PO TABS
40.0000 mg | ORAL_TABLET | Freq: Three times a day (TID) | ORAL | Status: DC
  Filled 2020-11-13 (×2): qty 1

## 2020-11-13 MED ORDER — FUROSEMIDE 10 MG/ML IJ SOLN
40.0000 mg | Freq: Two times a day (BID) | INTRAMUSCULAR | Status: DC
  Administered 2020-11-13: 40 mg via INTRAVENOUS
  Filled 2020-11-13: qty 4

## 2020-11-13 MED ORDER — FERROUS SULFATE 325 (65 FE) MG PO TABS
325.0000 mg | ORAL_TABLET | Freq: Two times a day (BID) | ORAL | Status: DC
  Administered 2020-11-13 – 2020-11-20 (×16): 325 mg via ORAL
  Filled 2020-11-13 (×16): qty 1

## 2020-11-13 MED ORDER — VITAMIN B-12 1000 MCG PO TABS
500.0000 ug | ORAL_TABLET | Freq: Every day | ORAL | Status: DC
  Administered 2020-11-13 – 2020-11-20 (×8): 500 ug via ORAL
  Filled 2020-11-13 (×8): qty 1

## 2020-11-13 MED ORDER — INSULIN GLARGINE-YFGN 100 UNIT/ML ~~LOC~~ SOLN
10.0000 [IU] | Freq: Every day | SUBCUTANEOUS | Status: DC
  Filled 2020-11-13: qty 0.1

## 2020-11-13 MED ORDER — INSULIN GLARGINE-YFGN 100 UNIT/ML ~~LOC~~ SOLN
25.0000 [IU] | Freq: Every day | SUBCUTANEOUS | Status: DC
  Administered 2020-11-13 – 2020-11-20 (×8): 25 [IU] via SUBCUTANEOUS
  Filled 2020-11-13 (×8): qty 0.25

## 2020-11-13 MED ORDER — FUROSEMIDE 10 MG/ML IJ SOLN
60.0000 mg | Freq: Two times a day (BID) | INTRAMUSCULAR | Status: DC
  Administered 2020-11-13 – 2020-11-15 (×5): 60 mg via INTRAVENOUS
  Filled 2020-11-13 (×5): qty 6

## 2020-11-13 MED ORDER — PRAMIPEXOLE DIHYDROCHLORIDE 0.125 MG PO TABS
0.1250 mg | ORAL_TABLET | Freq: Every day | ORAL | Status: DC
  Administered 2020-11-13 – 2020-11-15 (×3): 0.125 mg via ORAL
  Filled 2020-11-13 (×3): qty 1

## 2020-11-13 MED ORDER — EZETIMIBE 10 MG PO TABS
10.0000 mg | ORAL_TABLET | Freq: Every day | ORAL | Status: DC
  Administered 2020-11-13 – 2020-11-15 (×3): 10 mg via ORAL
  Filled 2020-11-13 (×3): qty 1

## 2020-11-13 MED ORDER — CARBAMAZEPINE ER 100 MG PO TB12
100.0000 mg | ORAL_TABLET | Freq: Two times a day (BID) | ORAL | Status: DC
  Administered 2020-11-13 – 2020-11-20 (×16): 100 mg via ORAL
  Filled 2020-11-13 (×17): qty 1

## 2020-11-13 MED ORDER — OXYCODONE HCL 5 MG PO TABS
5.0000 mg | ORAL_TABLET | Freq: Two times a day (BID) | ORAL | Status: DC | PRN
  Administered 2020-11-13: 5 mg via ORAL
  Filled 2020-11-13: qty 1

## 2020-11-13 MED ORDER — AMANTADINE HCL 100 MG PO CAPS
100.0000 mg | ORAL_CAPSULE | Freq: Every day | ORAL | Status: DC
  Administered 2020-11-13 – 2020-11-20 (×8): 100 mg via ORAL
  Filled 2020-11-13 (×9): qty 1

## 2020-11-13 MED ORDER — GABAPENTIN 300 MG PO CAPS
300.0000 mg | ORAL_CAPSULE | Freq: Three times a day (TID) | ORAL | Status: DC
  Administered 2020-11-13 – 2020-11-14 (×6): 300 mg via ORAL
  Filled 2020-11-13 (×6): qty 1

## 2020-11-13 MED ORDER — SPIRONOLACTONE 25 MG PO TABS
50.0000 mg | ORAL_TABLET | Freq: Every day | ORAL | Status: DC
  Administered 2020-11-13 – 2020-11-20 (×8): 50 mg via ORAL
  Filled 2020-11-13 (×9): qty 2

## 2020-11-13 MED ORDER — BUPROPION HCL ER (XL) 150 MG PO TB24
150.0000 mg | ORAL_TABLET | Freq: Every day | ORAL | Status: DC
  Administered 2020-11-13 – 2020-11-14 (×2): 150 mg via ORAL
  Filled 2020-11-13 (×3): qty 1

## 2020-11-13 MED ORDER — ACETAMINOPHEN 650 MG RE SUPP: 650.0000 mg | Freq: Four times a day (QID) | RECTAL | Status: DC | PRN

## 2020-11-13 MED ORDER — PANTOPRAZOLE SODIUM 40 MG PO TBEC
40.0000 mg | DELAYED_RELEASE_TABLET | Freq: Every day | ORAL | Status: DC
  Administered 2020-11-14 – 2020-11-20 (×7): 40 mg via ORAL
  Filled 2020-11-13 (×8): qty 1

## 2020-11-13 MED ORDER — POTASSIUM CHLORIDE 20 MEQ PO PACK
40.0000 meq | PACK | Freq: Once | ORAL | Status: AC
  Administered 2020-11-13: 40 meq via ORAL
  Filled 2020-11-13: qty 2

## 2020-11-13 MED ORDER — BASAGLAR KWIKPEN 100 UNIT/ML ~~LOC~~ SOPN: 25.0000 [IU] | PEN_INJECTOR | Freq: Every day | SUBCUTANEOUS | Status: DC

## 2020-11-13 MED ORDER — SERTRALINE HCL 100 MG PO TABS
100.0000 mg | ORAL_TABLET | Freq: Every day | ORAL | Status: DC
  Administered 2020-11-13 – 2020-11-14 (×2): 100 mg via ORAL
  Filled 2020-11-13 (×2): qty 1

## 2020-11-13 MED ORDER — PROPRANOLOL HCL 20 MG PO TABS
20.0000 mg | ORAL_TABLET | Freq: Three times a day (TID) | ORAL | Status: DC
  Administered 2020-11-13 – 2020-11-20 (×20): 20 mg via ORAL
  Filled 2020-11-13 (×23): qty 1

## 2020-11-13 MED ORDER — CARBIDOPA-LEVODOPA 10-100 MG PO TABS
1.0000 | ORAL_TABLET | Freq: Three times a day (TID) | ORAL | Status: DC
  Administered 2020-11-13 – 2020-11-20 (×22): 1 via ORAL
  Filled 2020-11-13 (×26): qty 1

## 2020-11-13 MED ORDER — MAGNESIUM SULFATE 2 GM/50ML IV SOLN
2.0000 g | Freq: Once | INTRAVENOUS | Status: AC
  Administered 2020-11-13: 2 g via INTRAVENOUS
  Filled 2020-11-13: qty 50

## 2020-11-13 MED ORDER — INSULIN ASPART 100 UNIT/ML IJ SOLN
0.0000 [IU] | Freq: Three times a day (TID) | INTRAMUSCULAR | Status: DC
  Administered 2020-11-13 (×2): 2 [IU] via SUBCUTANEOUS
  Administered 2020-11-13: 1 [IU] via SUBCUTANEOUS
  Administered 2020-11-14: 3 [IU] via SUBCUTANEOUS
  Administered 2020-11-14: 2 [IU] via SUBCUTANEOUS
  Administered 2020-11-15: 3 [IU] via SUBCUTANEOUS
  Administered 2020-11-15: 1 [IU] via SUBCUTANEOUS
  Administered 2020-11-16: 3 [IU] via SUBCUTANEOUS
  Administered 2020-11-16: 2 [IU] via SUBCUTANEOUS
  Administered 2020-11-16: 1 [IU] via SUBCUTANEOUS
  Administered 2020-11-17: 2 [IU] via SUBCUTANEOUS
  Administered 2020-11-17: 3 [IU] via SUBCUTANEOUS
  Administered 2020-11-17: 2 [IU] via SUBCUTANEOUS
  Administered 2020-11-18: 3 [IU] via SUBCUTANEOUS
  Administered 2020-11-18: 2 [IU] via SUBCUTANEOUS
  Administered 2020-11-18: 3 [IU] via SUBCUTANEOUS
  Administered 2020-11-19: 1 [IU] via SUBCUTANEOUS
  Administered 2020-11-19 (×2): 3 [IU] via SUBCUTANEOUS
  Administered 2020-11-20: 1 [IU] via SUBCUTANEOUS
  Administered 2020-11-20 (×2): 3 [IU] via SUBCUTANEOUS

## 2020-11-13 NOTE — H&P (Signed)
History and Physical    Donna [name] FYT:[id number] DOB: 10-Jun-1950 DOA: 11/12/2020  PCP: Janine Limbo, PA-C  Patient coming from: Skilled nursing facility.  Chief Complaint: Shortness of breath.  Peripheral edema and confusion   HPI: Donna [name] is a 70 y.o. female with history of diastolic CHF last EF measured in 2019 was 55 to 60% with history of A. fib diabetes mellitus type 2, anemia, sleep apnea Parkinson's disease bedbound with sacral ulcers was brought to the ER after patient was found to have increasing peripheral edema with shortness of breath.  Patient also was found to be confused.  On my exam patient denies any chest pain did have some shortness of breath denies any abdominal pain or nausea vomiting.  ED Course: In the ER patient is found to be febrile with temperature 100 F chest x-ray shows congestion with pleural effusion pneumonia not ruled out UA is concerning for UTI.  Labs are significant for magnesium of 1.6 platelets have decreased from usual it is around 125 hemoglobin is 10.6.  Patient was given Lasix 40 mg IV for CHF and Levaquin started for possible UTI/pneumonia.  COVID test was negative.  Review of Systems: As per HPI, rest all negative.   Past Medical History:  Diagnosis Date   CHF (congestive heart failure) (HCC)    Chronic /permanent atrial fibrillation ([place]) 2005   On Warfarin (follwed @ [facility]); Rate controlled - On BB   Chronic diastolic heart failure, NYHA class 2 (HCC)    Updated by A. fib, hypertensive heart disease and obesity; EDP was only 7 by cardiac catheterization in September 2017   Diabetic lumbosacral plexopathy (HCC)    Fibromyalgia    Generalized anxiety disorder    Hypokalemia 09/03/2017   Incontinence    Major depressive disorder    OSA on CPAP    Parkinson's disease ([place])    Restless leg syndrome    Syncope 09/03/2017   Type II diabetes mellitus [facility])     Past Surgical History:  Procedure Laterality  Date   CARDIAC CATHETERIZATION  11/09/2015   [facility]: Nonocclusive CAD. EF 55%. EDP 7 mmHg.   CHOLECYSTECTOMY OPEN     JOINT REPLACEMENT     KNEE ARTHROSCOPY Right    MASS EXCISION Left    sarcoma - shoulder   SHOULDER OPEN ROTATOR CUFF REPAIR Bilateral    TONSILLECTOMY     TOTAL HIP ARTHROPLASTY Right 2015   TOTAL HIP REVISION Right 2015   TOTAL KNEE ARTHROPLASTY Right    TRANSTHORACIC ECHOCARDIOGRAM  11/2013; 02/2016   a) [facility]: with Definity:  EF 60-65%. Mod LA dilation. Mild AoV sclerosis. Normal RHP. Indeterminate LV filling pressures. b) [facility]: Normal cavity size. EF 55-60%.no RWMA, mild AoV sclerosis (R cusp), Mod MAC. Mild biAtrial dilation.   TRANSTHORACIC ECHOCARDIOGRAM  08/2017    Mild LVH with normal EF 55-60%.  Unable to fully assess wall motion, but appear to be normal.  Unable to determine diastolic function because of A. fib.  Mild to moderate MAC with mild mitral stenosis (mean gradient 9 mmHg).  Moderate LA dilation.  CVP estimated 3 mmHg.  No obvious evidence of pulmonary hypertension.   VAGINAL HYSTERECTOMY       reports that she has never smoked. She has never used smokeless tobacco. She reports that she does not drink alcohol and does not use drugs.  Allergies  Allergen Reactions   Bee Venom Hives    Takes benadryl  Other Other (See Comments)    Raw foods with seeds give her "boils".  Avoids raw strawberries, blueberries. Tolerates cooked fruits, tomato sauce, bread/grains with seeds. [facility] 10/17/13 Berries with seeds Allergy to glutamine-C-quercet-selen-brom per [facility] 09/25/17   Zosyn [Piperacillin Sod-Tazobactam So] Rash    Itchy total body rash.   Alteplase Rash   Cefepime Rash   Fentanyl Other (See Comments)    Hallucinations, syncope     Metformin And Related Diarrhea   Vancomycin Rash    Family History  Problem Relation Age of Onset   Hypertension Mother    Heart disease Mother    Leukemia Son     Prior to Admission  medications   Medication Sig Start Date End Date Taking? Authorizing Provider  acetaminophen (TYLENOL) 500 MG tablet Take 1,000 mg by mouth 3 (three) times daily.     [provider]  ARIPiprazole (ABILIFY) 10 MG tablet Take 10 mg by mouth daily.    [provider]  docusate sodium (COLACE) 100 MG capsule Take 100 mg by mouth daily as needed for mild constipation.    [provider]  Dulaglutide (TRULICITY) 1.5 XT/0.2IO SOPN Inject 1.5 mg into the skin every Saturday.     [provider]  ezetimibe (ZETIA) 10 MG tablet Take 10 mg by mouth at bedtime.     [provider]  ferrous sulfate 325 (65 FE) MG tablet Take 325 mg by mouth daily with breakfast.     [provider]  furosemide (LASIX) 20 MG tablet Take 1 tablet (20 mg total) by mouth daily. 12/15/19   Lendon Colonel, NP  gabapentin (NEURONTIN) 300 MG capsule Take 600 mg by mouth 3 (three) times daily.    [provider]  Insulin Glargine (BASAGLAR KWIKPEN) 100 UNIT/ML SOPN Inject 0.25 mLs (25 Units total) into the skin at bedtime. 04/07/19   Johnson, Clanford L, MD  insulin regular (NOVOLIN R) 100 units/mL injection Inject 0-12 Units into the skin 3 (three) times daily before meals. 0-200 0 units 201-250 6 units 251-300 8 units 301-350 10 units 351-400 12 units Greater than 400 call MD    [provider]  lidocaine (LIDODERM) 5 % Place 1 patch onto the skin daily as needed (right hip pain). Remove & Discard patch within 12 hours or as directed by MD     [provider]  loperamide (IMODIUM A-D) 2 MG tablet Take 2 mg by mouth 4 (four) times daily as needed for diarrhea or loose stools.    [provider]  loratadine (CLARITIN) 10 MG tablet Take 1 tablet (10 mg total) by mouth daily. 04/14/19   Shelda Pal, DO  magnesium oxide (MAG-OX) 400 MG tablet Take 1 tablet (400 mg total) by mouth 3 (three) times daily. ALSO TAKE A EXTRA 400 MG TABLET  ON THE DAYS YOU TAKE ZAROXOLYN TABLET Patient taking differently: Take 400 mg by mouth 3 (three) times daily.  02/02/18   Leonie Man, MD  metoprolol succinate (TOPROL-XL) 25 MG 24 hr tablet Take 1 tablet (25 mg total) by mouth daily. 12/15/19   Lendon Colonel, NP  omeprazole (PRILOSEC) 20 MG capsule Take 20 mg by mouth daily as needed (acid reflux/indigestion).     [provider]  oxyCODONE (OXY IR/ROXICODONE) 5 MG immediate release tablet Take 1 tablet (5 mg total) by mouth every 6 (six) hours as needed for severe pain. 04/07/19   Johnson, Clanford L, MD  potassium chloride SA (KLOR-CON) 20  MEQ tablet Take 1 tablet (20 mEq total) by mouth daily. TAKE AN EXTRA 20 MEQ ON THE DAYS YOU TAKE ZAROXLYN 01/05/20   Leonie Man, MD  pramipexole (MIRAPEX) 0.125 MG tablet Take 0.125 mg by mouth at bedtime.     [provider]  sertraline (ZOLOFT) 100 MG tablet Take 100 mg by mouth at bedtime.     [provider]  warfarin (COUMADIN) 1 MG tablet Take 1 mg by mouth See admin instructions. Take as directed by MD per weekly levels    [provider]  warfarin (JANTOVEN) 2 MG tablet Take 2 mg by mouth See admin instructions. Take as directed by MD per weekly levels    [provider]  warfarin (JANTOVEN) 5 MG tablet Take 5 mg by mouth See admin instructions. Take as directed by MD per weekly levels    [provider]    Physical Exam: Constitutional: Moderately built and nourished. Vitals:   11/13/20 0100 11/13/20 0200 11/13/20 0230 11/13/20 0238  BP: 119/68 116/64 111/68   Pulse: 86 82 92   Resp: _0 Temp:    98.2 F (36.8 C)  TempSrc:    Oral  SpO2: 97% 98% 95%   Weight:      Height:       Eyes: Anicteric no pallor. ENMT: No discharge from the ears eyes nose and mouth. Neck: No mass felt.  No neck rigidity. Respiratory: No rhonchi or crepitations. Cardiovascular: S1-S2 heard. Abdomen: Soft nontender bowel sound  present. Musculoskeletal: Edema of the lower extremities. Skin: Has sacral ulcers I am unable to clearly assess. Neurologic: Alert awake oriented to her name and place moving all extremities. Psychiatric: Oriented to name and place.   Labs on Admission: I have personally reviewed following labs and imaging studies  CBC: Recent Labs  Lab 11/12/20 1556 11/12/20 1945  WBC  --  7.5  NEUTROABS  --  5.8  HGB 12.6 10.6*  HCT 37.0 36.8  MCV  --  91.5  PLT  --  062*   Basic Metabolic Panel: Recent Labs  Lab 11/12/20 1433 11/12/20 1556  NA 139 143  K 3.5 3.3*  CL 88*  --   CO2 41*  --   GLUCOSE 138*  --   BUN 14  --   CREATININE 0.52  --   CALCIUM 8.3*  --   MG 1.6*  --    GFR: Estimated Creatinine Clearance: 86.6 mL/min (by C-G formula based on SCr of 0.52 mg/dL). Liver Function Tests: Recent Labs  Lab 11/12/20 1433  AST 20  ALT 10  ALKPHOS 75  BILITOT 1.5*  PROT 5.9*  ALBUMIN 2.8*   No results for input(s): LIPASE, AMYLASE in the last 168 hours. No results for input(s): AMMONIA in the last 168 hours. Coagulation Profile: Recent Labs  Lab 11/12/20 1433  INR 1.8*   Cardiac Enzymes: No results for input(s): CKTOTAL, CKMB, CKMBINDEX, TROPONINI in the last 168 hours. BNP (last 3 results) No results for input(s): PROBNP in the last 8760 hours. HbA1C: No results for input(s): HGBA1C in the last 72 hours. CBG: No results for input(s): GLUCAP in the last 168 hours. Lipid Profile: No results for input(s): CHOL, HDL, LDLCALC, TRIG, CHOLHDL, LDLDIRECT in the last 72 hours. Thyroid Function Tests: No results for input(s): TSH, T4TOTAL, FREET4, T3FREE, THYROIDAB in the last 72 hours. Anemia Panel: No results for input(s): VITAMINB12, FOLATE, FERRITIN, TIBC, IRON, RETICCTPCT in the last 72 hours. Urine  analysis:    Component Value Date/Time   COLORURINE YELLOW 11/12/2020 1433   APPEARANCEUR CLOUDY (A) 11/12/2020 1433   LABSPEC 1.008 11/12/2020 1433   PHURINE 7.0  11/12/2020 1433   GLUCOSEU NEGATIVE 11/12/2020 1433   HGBUR SMALL (A) 11/12/2020 1433   BILIRUBINUR NEGATIVE 11/12/2020 1433   KETONESUR NEGATIVE 11/12/2020 1433   PROTEINUR NEGATIVE 11/12/2020 1433   UROBILINOGEN 1.0 09/06/2010 1130   NITRITE NEGATIVE 11/12/2020 1433   LEUKOCYTESUR LARGE (A) 11/12/2020 1433   Sepsis Labs: _0 (procalcitonin:4,lacticidven:4) ) Recent Results (from the past 240 hour(s))  Resp Panel by RT-PCR (Flu A&B, Covid) Nasopharyngeal Swab     Status: None   Collection Time: 11/12/20  2:33 PM   Specimen: Nasopharyngeal Swab; Nasopharyngeal(NP) swabs in vial transport medium  Result Value Ref Range Status   SARS Coronavirus 2 by RT PCR NEGATIVE NEGATIVE Final    Comment: (NOTE) SARS-CoV-2 target nucleic acids are NOT DETECTED.  The SARS-CoV-2 RNA is generally detectable in upper respiratory specimens during the acute phase of infection. The lowest concentration of SARS-CoV-2 viral copies this assay can detect is 138 copies/mL. A negative result does not preclude SARS-Cov-2 infection and should not be used as the sole basis for treatment or other patient management decisions. A negative result may occur with  improper specimen collection/handling, submission of specimen other than nasopharyngeal swab, presence of viral mutation(s) within the areas targeted by this assay, and inadequate number of viral copies(<138 copies/mL). A negative result must be combined with clinical observations, patient history, and epidemiological information. The expected result is Negative.  Fact Sheet for Patients:  EntrepreneurPulse.com.au  Fact Sheet for Healthcare Providers:  IncredibleEmployment.be  This test is no t yet approved or cleared by the [place] FDA and  has been authorized for detection and/or diagnosis of SARS-CoV-2 by FDA under an Emergency Use Authorization (EUA). This EUA will remain  in effect (meaning this  test can be used) for the duration of the COVID-19 declaration under Section 564(b)(1) of the Act, 21 U.S.C.section 360bbb-3(b)(1), unless the authorization is terminated  or revoked sooner.       Influenza A by PCR NEGATIVE NEGATIVE Final   Influenza B by PCR NEGATIVE NEGATIVE Final    Comment: (NOTE) The Xpert Xpress SARS-CoV-2/FLU/RSV plus assay is intended as an aid in the diagnosis of influenza from Nasopharyngeal swab specimens and should not be used as a sole basis for treatment. Nasal washings and aspirates are unacceptable for Xpert Xpress SARS-CoV-2/FLU/RSV testing.  Fact Sheet for Patients: EntrepreneurPulse.com.au  Fact Sheet for Healthcare Providers: IncredibleEmployment.be  This test is not yet approved or cleared by the [place] FDA and has been authorized for detection and/or diagnosis of SARS-CoV-2 by FDA under an Emergency Use Authorization (EUA). This EUA will remain in effect (meaning this test can be used) for the duration of the COVID-19 declaration under Section 564(b)(1) of the Act, 21 U.S.C. section 360bbb-3(b)(1), unless the authorization is terminated or revoked.  Performed at [facility], [place] [address], [place], [place] [address]      Radiological Exams on Admission: DG Chest Port 1 View  Result Date: 11/12/2020 CLINICAL DATA:  Questionable sepsis.  Evaluate for abnormality. EXAM: PORTABLE CHEST 1 VIEW COMPARISON:  Radiographs 04/06/2019 and 04/02/2019.  CT 07/19/2020. FINDINGS: 1556 hours. Right arm PICC has been removed. The heart remains enlarged. There is increased vascular congestion with patchy opacities at both lung bases and a probable small right pleural effusion. No evidence of pneumothorax. The bones  appear unchanged. Telemetry leads overlie the chest. IMPRESSION: Cardiomegaly with vascular congestion, probable small right pleural effusion and probable mild edema. Cannot exclude early  pneumonia at the lung bases. Radiographic follow up recommended. Electronically Signed   By: Richardean Sale M.D.   On: 11/12/2020 16:31    EKG: Independently reviewed.  A. fib rate controlled.  Assessment/Plan Principal Problem:   Acute on chronic diastolic CHF (congestive heart failure) (HCC) Active Problems:   Permanent atrial fibrillation ([place]): CHA2DS2-VASc Score 5; on Warfarin   OSA (obstructive sleep apnea)   Essential hypertension   Type 2 diabetes mellitus (HCC)   Decubitus ulcer of ischium, left, stage IV (HCC)   Acute CHF (congestive heart failure) (HCC)    Acute on chronic diastolic CHF last EF measured in 2019 was 55 to 60% presently placed on Lasix 40 mg IV every 12 closely monitor intake output and metabolic panel. Fever likely source could be UTI.  However pneumonia not ruled out.  On Levaquin follow cultures. A. fib on Coumadin and also takes propranolol. Hypertension on ARB and propranolol. Sleep apnea on CPAP at bedtime. Diabetes mellitus type 2 on insulin takes Lantus with sliding scale coverage. Anemia appears to be chronic follow CBC. Thrombocytopenia appears to be new.  Could be from infectious source.  Needs to follow closely CBC. Parkinson's on amantadine and Sinemet. Chronic sacral ulcers we will consult wound team. Acute encephalopathy seems to have improved.  CT head is pending.  Could be from fever and UTI.   Since patient has CHF with encephalopathy and possible UTI will need close monitoring for any further worsening inpatient status.   DVT prophylaxis: Coumadin. Code Status: Full code. Family Communication: We will need to discuss with family. Disposition Plan: Back to facility when stable. Consults called: Wound team. Admission status: Inpatient.   Rise Patience MD [facility] Pager [phone].  If 7PM-7AM, please contact night-coverage [email or web address] Password [facility]  11/13/2020, 2:48 AM

## 2020-11-13 NOTE — Telephone Encounter (Signed)
Patient's husband would like to make Dr. Ellyn Hack aware that the patient is currently hospitalized. He would like to have Dr. Ellyn Hack review the ED notes when they are posted if possible.

## 2020-11-13 NOTE — Progress Notes (Signed)
ANTICOAGULATION CONSULT NOTE - Initial Consult  Pharmacy Consult for Coumadin Indication: atrial fibrillation  Allergies  Allergen Reactions   Bee Venom Hives    Takes benadryl   Other Other (See Comments)    Raw foods with seeds give her "boils".  Avoids raw strawberries, blueberries. Tolerates cooked fruits, tomato sauce, bread/grains with seeds. [facility] 10/17/13 Berries with seeds Allergy to glutamine-C-quercet-selen-brom per [facility] 09/25/17   Zosyn [Piperacillin Sod-Tazobactam So] Rash    Itchy total body rash.   Alteplase Rash   Cefepime Rash   Fentanyl Other (See Comments)    Hallucinations, syncope     Metformin And Related Diarrhea   Vancomycin Rash    Patient Measurements: Height: '5\' 10"'$  (177.8 cm) Weight: 104 kg (229 lb 4.5 oz) IBW/kg (Calculated) : 68.5  Vital Signs: Temp: 98.2 F (36.8 C) (09/13 0238) Temp Source: Oral (09/13 0238) BP: 118/74 (09/13 0300) Pulse Rate: 78 (09/13 0300)  Labs: Recent Labs    11/12/20 1433 11/12/20 1556 11/12/20 1945  HGB  --  12.6 10.6*  HCT  --  37.0 36.8  PLT  --   --  125*  APTT 31  --   --   LABPROT 20.7*  --   --   INR 1.8*  --   --   CREATININE 0.52  --   --     Estimated Creatinine Clearance: 86.6 mL/min (by C-G formula based on SCr of 0.52 mg/dL).   Medical History: Past Medical History:  Diagnosis Date   CHF (congestive heart failure) ([place])    Chronic /permanent atrial fibrillation ([place]) 2005   On Warfarin (follwed @ [facility]); Rate controlled - On BB   Chronic diastolic heart failure, NYHA class 2 (HCC)    Updated by A. fib, hypertensive heart disease and obesity; EDP was only 7 by cardiac catheterization in September 2017   Diabetic lumbosacral plexopathy (HCC)    Fibromyalgia    Generalized anxiety disorder    Hypokalemia 09/03/2017   Incontinence    Major depressive disorder    OSA on CPAP    Parkinson's disease ([place])    Restless leg syndrome    Syncope 09/03/2017   Type II  diabetes mellitus (HCC)     Medications:  No current facility-administered medications on file prior to encounter.   Current Outpatient Medications on File Prior to Encounter  Medication Sig Dispense Refill   acetaminophen (TYLENOL) 500 MG tablet Take 1,000 mg by mouth 3 (three) times daily.      ARIPiprazole (ABILIFY) 10 MG tablet Take 10 mg by mouth daily.     docusate sodium (COLACE) 100 MG capsule Take 100 mg by mouth daily as needed for mild constipation.     Dulaglutide (TRULICITY) 1.5 0000000 SOPN Inject 1.5 mg into the skin every Saturday.      ezetimibe (ZETIA) 10 MG tablet Take 10 mg by mouth at bedtime.      ferrous sulfate 325 (65 FE) MG tablet Take 325 mg by mouth daily with breakfast.      furosemide (LASIX) 20 MG tablet Take 1 tablet (20 mg total) by mouth daily. 90 tablet 3   gabapentin (NEURONTIN) 300 MG capsule Take 600 mg by mouth 3 (three) times daily.     Insulin Glargine (BASAGLAR KWIKPEN) 100 UNIT/ML SOPN Inject 0.25 mLs (25 Units total) into the skin at bedtime.     insulin regular (NOVOLIN R) 100 units/mL injection Inject 0-12 Units into the skin 3 (three) times daily  before meals. 0-200 0 units 201-250 6 units 251-300 8 units 301-350 10 units 351-400 12 units Greater than 400 call MD     lidocaine (LIDODERM) 5 % Place 1 patch onto the skin daily as needed (right hip pain). Remove & Discard patch within 12 hours or as directed by MD      loperamide (IMODIUM A-D) 2 MG tablet Take 2 mg by mouth 4 (four) times daily as needed for diarrhea or loose stools.     loratadine (CLARITIN) 10 MG tablet Take 1 tablet (10 mg total) by mouth daily.     magnesium oxide (MAG-OX) 400 MG tablet Take 1 tablet (400 mg total) by mouth 3 (three) times daily. ALSO TAKE A EXTRA 400 MG TABLET ON THE DAYS YOU TAKE ZAROXOLYN TABLET (Patient taking differently: Take 400 mg by mouth 3 (three) times daily. ) 300 tablet 0   metoprolol succinate (TOPROL-XL) 25 MG 24 hr tablet Take 1 tablet (25 mg  total) by mouth daily. 90 tablet 3   omeprazole (PRILOSEC) 20 MG capsule Take 20 mg by mouth daily as needed (acid reflux/indigestion).      oxyCODONE (OXY IR/ROXICODONE) 5 MG immediate release tablet Take 1 tablet (5 mg total) by mouth every 6 (six) hours as needed for severe pain. 30 tablet 0   potassium chloride SA (KLOR-CON) 20 MEQ tablet Take 1 tablet (20 mEq total) by mouth daily. TAKE AN EXTRA 20 MEQ ON THE DAYS YOU TAKE ZAROXLYN 100 tablet 3   pramipexole (MIRAPEX) 0.125 MG tablet Take 0.125 mg by mouth at bedtime.      sertraline (ZOLOFT) 100 MG tablet Take 100 mg by mouth at bedtime.      warfarin (COUMADIN) 1 MG tablet Take 1 mg by mouth See admin instructions. Take as directed by MD per weekly levels     warfarin (JANTOVEN) 2 MG tablet Take 2 mg by mouth See admin instructions. Take as directed by MD per weekly levels     warfarin (JANTOVEN) 5 MG tablet Take 5 mg by mouth See admin instructions. Take as directed by MD per weekly levels       Assessment: 70 y.o. female admitted with CHF, h/o Afib, to continue Coumadin.    Goal of Therapy:  INR 2-3 Monitor platelets by anticoagulation protocol: Yes   Plan:  F/U current Coumadin regimen  Daily INR  Caryl Pina 11/13/2020,3:22 AM

## 2020-11-13 NOTE — Consult Note (Signed)
WOC Nurse Consult Note: Reason for Consult: Consult received for guidance on care for chronic, nonhealing coccygeal pressure injury, Stage 4. Patient is known to our service from previous admissions. Reports that etiology was due to prolonged admission to a skilled facility over a year ago. Patient with recent history of UTI. PMH includes Parkinson's disease, Heart Failure, Diabetes, Incontinence. A PurWick external urinary incontinence suction device is in use for urinary incontinence. Wound type:Pressure Pressure Injury POA: Yes Measurement:Per A. Vevelyn Royals, ED RN upon admission:  4.5cm x 1.5cm x 2cm with 3cm tunneling. These measurements are essentially unchanged since the last time an associate of mine Lenore Manner) assessed this wound on 04/11/2019.  Wound bed:Red, moist Drainage (amount, consistency, odor) serosanguinous in moderate amount Periwound: intact, mild erythema Dressing procedure/placement/frequency: I will today provide a mattress replacement with low air loss feature and bilateral pressure redistribution heel boots for pressure injury prevention in this patient. Turning and repositioning is in place and time in the supine position is to be minimized. Topical care will consist of filling the defect with a silver hydrofiber (Aquacel Ag+ Advantage) for antimicrobial, absorption and hemostatic properties. The cornerstone of this POC will be pressure redistribution via turning and minimizing time in the supine position.  [place] nursing team will not follow, but will remain available to this patient, the nursing and medical teams.  Please re-consult if needed. Thanks, Maudie Flakes, MSN, RN, [place], Arther Abbott  Pager# [phone]

## 2020-11-13 NOTE — ED Notes (Addendum)
Pt brief changed. Mepilex in place over sacral ulcer. Purewick in position. Pt moved up in bed. Pt given ice water

## 2020-11-13 NOTE — ED Notes (Signed)
Pt calling out for help. This RN went in to assess patient. Pt saying that she needs assistance getting to her vehicle. This RN reassured and redirected patient that she is in the hospital and getting help.

## 2020-11-13 NOTE — ED Notes (Signed)
Pt to CT

## 2020-11-13 NOTE — Evaluation (Signed)
Physical Therapy Evaluation Patient Details Name: Donna [name] MRN: [id number] DOB: Sep 21, 1950 Today's Date: 11/13/2020  History of Present Illness  Donna [name] is a 70 y.o. female admitted with increasing peripheral edema with shortness of breath and confusion. PMH:  diastolic CHF last EF measured in 2019 was 55 to 60% with history of A. fib, DMII, anemia, sleep apnea Parkinson's disease bedbound with sacral ulcers   Clinical Impression  Pt confused with notable edema t/o body especially abdomen and LEs. Pt with limited ability to actively participate. Pt poor historian. Per chart pt bed bound. Pt states they lift her into a chair. With exception of cognition suspect pt is near baseline level of function. Acute PT to cont to follow.       Recommendations for follow up therapy are one component of a multi-disciplinary discharge planning process, led by the attending physician.  Recommendations may be updated based on patient status, additional functional criteria and insurance authorization.  Follow Up Recommendations SNF (return to snf)    Equipment Recommendations  None recommended by PT    Recommendations for Other Services       Precautions / Restrictions Precautions Precautions: Fall Precaution Comments: many sacral ulcers Restrictions Weight Bearing Restrictions: No      Mobility  Bed Mobility Overal bed mobility: Needs Assistance Bed Mobility: Rolling Rolling: Total assist;+2 for physical assistance         General bed mobility comments: totalAx2 for rolling L/R for hygiene s/p urinary incontinence    Transfers                 General transfer comment: unable this date as pt with no active participation, hallucination and grimacing everytime pt moved, transfered pt to airbed  Ambulation/Gait             General Gait Details: unable  Stairs            Wheelchair Mobility    Modified Rankin (Stroke Patients Only)       Balance Overall  balance assessment: Needs assistance     Sitting balance - Comments: anticipate pt to require totalA to maintain EOB balance                                     Pertinent Vitals/Pain Pain Assessment: Faces Faces Pain Scale: Hurts little more Pain Location: generalized with mobility, groaning and moaning Pain Descriptors / Indicators: Discomfort;Moaning Pain Intervention(s): Monitored during session    Home Living Family/patient expects to be discharged to:: Skilled nursing facility                      Prior Function Level of Independence: Needs assistance   Gait / Transfers Assistance Needed: in w/c  ADL's / Homemaking Assistance Needed: assist  Comments: pt confused but stated [facility] pt poor historian     Hand Dominance   Dominant Hand: Right    Extremity/Trunk Assessment   Upper Extremity Assessment Upper Extremity Assessment: Generalized weakness (limited active movement)    Lower Extremity Assessment Lower Extremity Assessment: Generalized weakness (edematous, bilat knees in flexion contractures)    Cervical / Trunk Assessment Cervical / Trunk Assessment: Normal  Communication   Communication: No difficulties  Cognition Arousal/Alertness: Awake/alert Behavior During Therapy: Flat affect;Anxious (crying, hallucinating) Overall Cognitive Status: No family/caregiver present to determine baseline cognitive functioning  General Comments: pt able to state name, not oriented to place or situation or date, pt with no recall once re-oriented. Pt hallucinating and talking in 3rd person, pt unaware of urinary incontinence      General Comments General comments (skin integrity, edema, etc.): pt with multiple sacral sores, pt on 2LO2 via [place], edema t/o abdomen and bilat LEs    Exercises Other Exercises Other Exercises: attempted PROM x4 extremities, pt tolerated UEs well but  limited LEs due to flex knee contractures   Assessment/Plan    PT Assessment Patient needs continued PT services  PT Problem List Decreased strength;Decreased range of motion;Decreased activity tolerance;Decreased balance;Decreased mobility;Decreased coordination;Decreased cognition;Decreased knowledge of use of DME;Decreased safety awareness       PT Treatment Interventions DME instruction;Functional mobility training;Therapeutic activities;Therapeutic exercise    PT Goals (Current goals can be found in the Care Plan section)  Acute Rehab PT Goals Patient Stated Goal: unable to state PT Goal Formulation: Patient unable to participate in goal setting Time For Goal Achievement: 11/27/20 Potential to Achieve Goals: Good    Frequency Min 2X/week   Barriers to discharge        Co-evaluation               AM-PAC PT "6 Clicks" Mobility  Outcome Measure Help needed turning from your back to your side while in a flat bed without using bedrails?: Total Help needed moving from lying on your back to sitting on the side of a flat bed without using bedrails?: Total Help needed moving to and from a bed to a chair (including a wheelchair)?: Total Help needed standing up from a chair using your arms (e.g., wheelchair or bedside chair)?: Total Help needed to walk in hospital room?: Total Help needed climbing 3-5 steps with a railing? : Total 6 Click Score: 6    End of Session Equipment Utilized During Treatment: Oxygen Activity Tolerance:  (limited by confusion) Patient left: in bed;with call bell/phone within reach;with nursing/sitter in room (RN and tech present)   PT Visit Diagnosis: Unsteadiness on feet (R26.81);Muscle weakness (generalized) (M62.81);Difficulty in walking, not elsewhere classified (R26.2)    Time: 1340-1402 PT Time Calculation (min) (ACUTE ONLY): 22 min   Charges:   PT Evaluation $PT Eval Moderate Complexity: 1 Mod          Donna [name], PT,  DPT Acute Rehabilitation Services Pager #: [phone] Office #: [phone]   Donna [name] 11/13/2020, 2:22 PM

## 2020-11-13 NOTE — Progress Notes (Signed)
Heart Failure Navigator Progress Note  Assessed for Heart & Vascular TOC clinic readiness.  Patient does not meet criteria due to noted bedbound related to Parkinson's disease and confusion.   Navigator available for reassessment of patient.   EF 2019 55-60%  DC plan: return to SNF   Pricilla Holm, MSN, RN Heart Failure Nurse Navigator [phone]

## 2020-11-13 NOTE — Progress Notes (Addendum)
[facility] for Coumadin Indication: atrial fibrillation  Allergies  Allergen Reactions   Bee Venom Hives    Takes benadryl   Other Other (See Comments)    Raw foods with seeds give her "boils".  Avoids raw strawberries, blueberries. Tolerates cooked fruits, tomato sauce, bread/grains with seeds. [facility] 10/17/13 Berries with seeds Allergy to glutamine-C-quercet-selen-brom per [facility] 09/25/17   Zosyn [Piperacillin Sod-Tazobactam So] Rash    Itchy total body rash.   Alteplase Rash   Cefepime Rash   Fentanyl Other (See Comments)    Hallucinations, syncope     Metformin And Related Diarrhea   Vancomycin Rash    Patient Measurements: Height: '5\' 10"'$  (177.8 cm) Weight: 104 kg (229 lb 4.5 oz) IBW/kg (Calculated) : 68.5  Vital Signs: Temp: 99.6 F (37.6 C) (09/13 1051) Temp Source: Oral (09/13 1051) BP: 112/57 (09/13 1051) Pulse Rate: 77 (09/13 1051)  Labs: Recent Labs    11/12/20 1433 11/12/20 1556 11/12/20 1556 11/12/20 1945 11/13/20 0509  HGB  --  12.6   < > 10.6* 10.5*  HCT  --  37.0  --  36.8 36.6  PLT  --   --   --  125* 126*  APTT 31  --   --   --   --   LABPROT 20.7*  --   --   --  19.6*  INR 1.8*  --   --   --  1.7*  CREATININE 0.52  --   --   --  0.52   < > = values in this interval not displayed.     Estimated Creatinine Clearance: 86.6 mL/min (by C-G formula based on SCr of 0.52 mg/dL).  Assessment: 70 y.o. female admitted with CHF.  Pharmacy consulted to continue Coumadin for history of Afib.  Spoke to [facility] staff, patient's Coumadin dose was increased on 9/12 to '11mg'$  PO daily.  INR sub-therapeutic at 1.7.  Hemoglobin and platelet count low; no bleeding reported.  Noted DDI with Levaquin.  Goal of Therapy:  INR 2-3 Monitor platelets by anticoagulation protocol: Yes   Plan:  Coumadin 12.'5mg'$  PO today Daily PT / INR  Imaan Padgett D. Mina Marble, PharmD, BCPS, [place] 11/13/2020, 1:51 PM

## 2020-11-13 NOTE — Progress Notes (Signed)
PROGRESS NOTE                                                                                                                                                                                                             Patient Demographics:    Donna [name], is a 70 y.o. female, DOB - [birth year], [id number]  Outpatient Primary MD for the patient is O'Buch, Alvis Lemmings, PA-C    LOS - 0  Admit date - 11/12/2020    Chief Complaint  Patient presents with   Altered Mental Status       Brief Narrative (HPI from H&P)   Donna [name] is a 70 y.o. female with history of diastolic CHF last EF measured in 2019 was 55 to 60% with history of A. fib diabetes mellitus type 2, anemia, sleep apnea Parkinson's disease bedbound with sacral ulcers was brought to the ER after patient was found to have increasing peripheral edema with shortness of breath, was dignosed with CHF and admitted to the hospital.   Subjective:    Donna [name] today has, No headache, No chest pain, No abdominal pain - No Nausea, No new weakness tingling or numbness, mild SOB.   Assessment  & Plan :     Acute Hypoxic Resp. Failure due to Acute on chronic diastolic CHF last EF [id number] in 2019.  Agree with IV Lasix, dose increased, continue Aldactone and beta-blocker, continue supportive care with oxygen and monitor.  2.  UTI.  Agree with Levaquin.  Clinically does not appear she has pneumonia will monitor.  3.  OSA.  CPAP at night.  4.  Chronic anemia and thrombocytopenia.  Monitor.  5.  Sacral decubitus ulcers present on admission.  Foley if needed for hygiene, wound care team.  6.  Underlying Parkinson's.  On combination of Sinemet and amantadine. ? Underlying dementia.  7.  Hypertension.  Propranolol and ARB combination along with diuretics.  8.  Paroxysmal atrial fibrillation.  Mali vas 2 score of greater than 3.  On combination of propranolol and Coumadin.  Pharmacy  monitoring INR.  9.  Obesity.  BMI 32.  Follow-up with PCP for weight loss.  10.  DM type II.  On Lantus and sliding scale.  Will monitor and adjust.  Lab Results  Component Value Date   HGBA1C 6.0 (H) 04/03/2019  CBG (last 3)  Recent Labs    11/13/20 0844 11/13/20 1056  GLUCAP 167* 171*   Encouraged the patient to sit up in chair in the daytime use I-S and flutter valve for pulmonary toiletry.  Will advance activity and titrate down oxygen as possible.   SpO2: 98 % O2 Flow Rate (L/min): 3 L/min  Recent Labs  Lab 11/12/20 1433 11/12/20 1500 11/12/20 1556 11/12/20 1945 11/13/20 0509  WBC  --   --   --  7.5 6.2  HGB  --   --  12.6 10.6* 10.5*  HCT  --   --  37.0 36.8 36.6  PLT  --   --   --  125* 126*  BNP 412.3*  --   --   --   --   AST 20  --   --   --   --   ALT 10  --   --   --   --   ALKPHOS 75  --   --   --   --   BILITOT 1.5*  --   --   --   --   ALBUMIN 2.8*  --   --   --   --   INR 1.8*  --   --   --  1.7*  LATICACIDVEN  --  1.2  --   --   --   SARSCOV2NAA NEGATIVE  --   --   --   --            Condition - Fair  Family Communication  : Message left for patient's husband Donna [name] [phone] on 10/20/2020 at 11:22 AM.  Code Status :  Full  Consults  :  None  PUD Prophylaxis : PPI   Procedures  :     TTE  CT Head - Non acute      Disposition Plan  :    Status is: Inpatient  Remains inpatient appropriate because:IV treatments appropriate due to intensity of illness or inability to take PO  Dispo: The patient is from: SNF              Anticipated d/c is to: SNF              Patient currently is not medically stable to d/c.   Difficult to place patient No  DVT Prophylaxis  :  Coumadin  Lab Results  Component Value Date   INR 1.7 (H) 11/13/2020   INR 1.8 (H) 11/12/2020   INR 2.3 (H) 04/13/2019    Lab Results  Component Value Date   PLT 126 (L) 11/13/2020    Diet :  Diet Order             Diet heart healthy/carb modified  Room service appropriate? Yes; Fluid consistency: Thin; Fluid restriction: 1200 mL Fluid  Diet effective now                    Inpatient Medications  Scheduled Meds:  amantadine  100 mg Oral Daily   Basaglar KwikPen  25 Units Subcutaneous QHS   buPROPion  150 mg Oral Daily   carbamazepine  100 mg Oral BID   carbidopa-levodopa  1 tablet Oral TID WC   ezetimibe  10 mg Oral QHS   ferrous sulfate  325 mg Oral BID WC   furosemide  40 mg Intravenous BID   gabapentin  300 mg Oral TID   insulin aspart  0-9 Units Subcutaneous TID WC  losartan  12.5 mg Oral Daily   magnesium oxide  400 mg Oral BID   pantoprazole  40 mg Oral Daily   pramipexole  0.125 mg Oral QHS   propranolol  40 mg Oral TID   sertraline  100 mg Oral QHS   spironolactone  50 mg Oral Daily   vitamin B-12  500 mcg Oral Daily   Warfarin - Pharmacist Dosing Inpatient   Does not apply q1600   Continuous Infusions:  levofloxacin (LEVAQUIN) IV     PRN Meds:.acetaminophen **OR** acetaminophen, oxyCODONE  Antibiotics  :    Anti-infectives (From admission, onward)    Start     Dose/Rate Route Frequency Ordered Stop   11/13/20 1500  levofloxacin (LEVAQUIN) IVPB 750 mg        750 mg 100 mL/hr over 90 Minutes Intravenous Every 24 hours 11/12/20 2204     11/12/20 1500  levofloxacin (LEVAQUIN) IVPB 750 mg        750 mg 100 mL/hr over 90 Minutes Intravenous  Once 11/12/20 1454 11/12/20 1704        Time Spent in minutes  30   Donna [name] M.D on 11/13/2020 at 11:22 AM  To page go to [email or web address]   [facility] -  Office  [phone]  See all Orders from today for further details    Objective:   Vitals:   11/13/20 0630 11/13/20 0645 11/13/20 0931 11/13/20 1051  BP: 123/70  125/72 (!) 112/57  Pulse: (!) 110 72 99 77  Resp: (!) '22 16 20   '$ Temp:   99.5 F (37.5 C) 99.6 F (37.6 C)  TempSrc:   Oral Oral  SpO2: 97% 97% 99% 98%  Weight:      Height:        Wt Readings from Last 3 Encounters:   11/12/20 104 kg  08/18/19 114.8 kg  05/11/19 114.8 kg     Intake/Output Summary (Last 24 hours) at 11/13/2020 1122 Last data filed at 11/13/2020 0934 Gross per 24 hour  Intake --  Output 900 ml  Net -900 ml     Physical Exam  Awake mildly confused, moves all 4 extremities by self ,       [place].AT,PERRAL Supple Neck,No JVD, No cervical lymphadenopathy appriciated.  Symmetrical Chest wall movement, Good air movement bilaterally, few rales RRR,No Gallops,Rubs or new Murmurs, No Parasternal Heave +ve B.Sounds, Abd Soft, No tenderness, No organomegaly appriciated, No rebound - guarding or rigidity. No Cyanosis, 1+ edema      RN pressure injury documentation: Pressure Injury 04/02/19 Sacrum Mid Stage 3 -  Full thickness tissue loss. Subcutaneous fat may be visible but bone, tendon or muscle are NOT exposed. (Active)  04/02/19 2355  Location: Sacrum  Location Orientation: Mid  Staging: Stage 3 -  Full thickness tissue loss. Subcutaneous fat may be visible but bone, tendon or muscle are NOT exposed.  Wound Description (Comments):   Present on Admission: Yes     Pressure Injury 11/12/20 Sacrum Mid Stage 3 -  Full thickness tissue loss. Subcutaneous fat may be visible but bone, tendon or muscle are NOT exposed. red and pink bedding, tunneled. (Active)  11/12/20 1555  Location: Sacrum  Location Orientation: Mid  Staging: Stage 3 -  Full thickness tissue loss. Subcutaneous fat may be visible but bone, tendon or muscle are NOT exposed.  Wound Description (Comments): red and pink bedding, tunneled.  Present on Admission: Yes     Data Review:    CBC Recent Labs  Lab 11/12/20 1556 11/12/20 1945 11/13/20 0509  WBC  --  7.5 6.2  HGB 12.6 10.6* 10.5*  HCT 37.0 36.8 36.6  PLT  --  125* 126*  MCV  --  91.5 91.5  MCH  --  26.4 26.3  MCHC  --  28.8* 28.7*  RDW  --  17.3* 17.4*  LYMPHSABS  --  0.8  --   MONOABS  --  0.8  --   EOSABS  --  0.1  --   BASOSABS  --  0.0  --      Recent Labs  Lab 11/12/20 1433 11/12/20 1500 11/12/20 1556 11/13/20 0509 11/13/20 0709  NA 139  --  143 140  --   K 3.5  --  3.3* 3.6  --   CL 88*  --   --  93*  --   CO2 41*  --   --  40*  --   GLUCOSE 138*  --   --  143*  --   BUN 14  --   --  14  --   CREATININE 0.52  --   --  0.52  --   CALCIUM 8.3*  --   --  8.5*  --   AST 20  --   --   --   --   ALT 10  --   --   --   --   ALKPHOS 75  --   --   --   --   BILITOT 1.5*  --   --   --   --   ALBUMIN 2.8*  --   --   --   --   MG 1.6*  --   --   --  1.9  LATICACIDVEN  --  1.2  --   --   --   INR 1.8*  --   --  1.7*  --   BNP 412.3*  --   --   --   --     ------------------------------------------------------------------------------------------------------------------ No results for input(s): CHOL, HDL, LDLCALC, TRIG, CHOLHDL, LDLDIRECT in the last 72 hours.  Lab Results  Component Value Date   HGBA1C 6.0 (H) 04/03/2019   ------------------------------------------------------------------------------------------------------------------ No results for input(s): TSH, T4TOTAL, T3FREE, THYROIDAB in the last 72 hours.  Invalid input(s): FREET3  Cardiac Enzymes No results for input(s): CKMB, TROPONINI, MYOGLOBIN in the last 168 hours.  Invalid input(s): CK ------------------------------------------------------------------------------------------------------------------    Component Value Date/Time   BNP 412.3 (H) 11/12/2020 1433    Micro Results Recent Results (from the past 240 hour(s))  Resp Panel by RT-PCR (Flu A&B, Covid) Nasopharyngeal Swab     Status: None   Collection Time: 11/12/20  2:33 PM   Specimen: Nasopharyngeal Swab; Nasopharyngeal(NP) swabs in vial transport medium  Result Value Ref Range Status   SARS Coronavirus 2 by RT PCR NEGATIVE NEGATIVE Final    Comment: (NOTE) SARS-CoV-2 target nucleic acids are NOT DETECTED.  The SARS-CoV-2 RNA is generally detectable in upper respiratory specimens  during the acute phase of infection. The lowest concentration of SARS-CoV-2 viral copies this assay can detect is 138 copies/mL. A negative result does not preclude SARS-Cov-2 infection and should not be used as the sole basis for treatment or other patient management decisions. A negative result may occur with  improper specimen collection/handling, submission of specimen other than nasopharyngeal swab, presence of viral mutation(s) within the areas targeted by this assay, and inadequate number of viral copies(<138 copies/mL). A negative result must  be combined with clinical observations, patient history, and epidemiological information. The expected result is Negative.  Fact Sheet for Patients:  EntrepreneurPulse.com.au  Fact Sheet for Healthcare Providers:  IncredibleEmployment.be  This test is no t yet approved or cleared by the [place] FDA and  has been authorized for detection and/or diagnosis of SARS-CoV-2 by FDA under an Emergency Use Authorization (EUA). This EUA will remain  in effect (meaning this test can be used) for the duration of the COVID-19 declaration under Section 564(b)(1) of the Act, 21 U.S.C.section 360bbb-3(b)(1), unless the authorization is terminated  or revoked sooner.       Influenza A by PCR NEGATIVE NEGATIVE Final   Influenza B by PCR NEGATIVE NEGATIVE Final    Comment: (NOTE) The Xpert Xpress SARS-CoV-2/FLU/RSV plus assay is intended as an aid in the diagnosis of influenza from Nasopharyngeal swab specimens and should not be used as a sole basis for treatment. Nasal washings and aspirates are unacceptable for Xpert Xpress SARS-CoV-2/FLU/RSV testing.  Fact Sheet for Patients: EntrepreneurPulse.com.au  Fact Sheet for Healthcare Providers: IncredibleEmployment.be  This test is not yet approved or cleared by the [place] FDA and has been authorized for detection  and/or diagnosis of SARS-CoV-2 by FDA under an Emergency Use Authorization (EUA). This EUA will remain in effect (meaning this test can be used) for the duration of the COVID-19 declaration under Section 564(b)(1) of the Act, 21 U.S.C. section 360bbb-3(b)(1), unless the authorization is terminated or revoked.  Performed at [facility], [place] [address], [place], [place] [address]   Blood Culture (routine x 2)     Status: None (Preliminary result)   Collection Time: 11/12/20  3:00 PM   Specimen: BLOOD  Result Value Ref Range Status   Specimen Description BLOOD LEFT ANTECUBITAL  Final   Special Requests   Final    BOTTLES DRAWN AEROBIC AND ANAEROBIC Blood Culture results may not be optimal due to an excessive volume of blood received in culture bottles   Culture   Final    NO GROWTH < 24 HOURS Performed at [facility], [place] [address] [place], [place] [address]    Report Status PENDING  Incomplete  Urine Culture     Status: Abnormal (Preliminary result)   Collection Time: 11/12/20  3:00 PM   Specimen: In/Out Cath Urine  Result Value Ref Range Status   Specimen Description IN/OUT CATH URINE  Final   Special Requests Normal  Final   Culture (A)  Final    >=100,000 COLONIES/mL GRAM NEGATIVE RODS CULTURE REINCUBATED FOR BETTER GROWTH SUSCEPTIBILITIES TO FOLLOW Performed at [facility], [place] [address] [place], [place] [address]    Report Status PENDING  Incomplete  Blood Culture (routine x 2)     Status: None (Preliminary result)   Collection Time: 11/12/20  3:21 PM   Specimen: BLOOD RIGHT HAND  Result Value Ref Range Status   Specimen Description BLOOD RIGHT HAND  Final   Special Requests   Final    BOTTLES DRAWN AEROBIC AND ANAEROBIC Blood Culture results may not be optimal due to an inadequate volume of blood received in culture bottles   Culture   Final    NO GROWTH < 24 HOURS Performed at [facility], [place] [address], [place], [place] [address]     Report Status PENDING  Incomplete    Radiology Reports CT HEAD WO CONTRAST (5MM)  Result Date: 11/13/2020 CLINICAL DATA:  Altered mental status. EXAM:  CT HEAD WITHOUT CONTRAST TECHNIQUE: Contiguous axial images were obtained from the base of the skull through the vertex without intravenous contrast. COMPARISON:  Jul 19, 2020 FINDINGS: Brain: There is mild cerebral atrophy with widening of the extra-axial spaces and ventricular dilatation. There are areas of decreased attenuation within the white matter tracts of the supratentorial brain, consistent with microvascular disease changes. Vascular: No hyperdense vessel or unexpected calcification. Skull: Normal. Negative for fracture or focal lesion. Sinuses/Orbits: No acute finding. Other: None. IMPRESSION: 1. Generalized cerebral atrophy. 2. No acute intracranial abnormality. Electronically Signed   By: Virgina Norfolk M.D.   On: 11/13/2020 03:39   DG Chest Port 1 View  Result Date: 11/12/2020 CLINICAL DATA:  Questionable sepsis.  Evaluate for abnormality. EXAM: PORTABLE CHEST 1 VIEW COMPARISON:  Radiographs 04/06/2019 and 04/02/2019.  CT 07/19/2020. FINDINGS: 1556 hours. Right arm PICC has been removed. The heart remains enlarged. There is increased vascular congestion with patchy opacities at both lung bases and a probable small right pleural effusion. No evidence of pneumothorax. The bones appear unchanged. Telemetry leads overlie the chest. IMPRESSION: Cardiomegaly with vascular congestion, probable small right pleural effusion and probable mild edema. Cannot exclude early pneumonia at the lung bases. Radiographic follow up recommended. Electronically Signed   By: Richardean Sale M.D.   On: 11/12/2020 16:31

## 2020-11-14 ENCOUNTER — Inpatient Hospital Stay (HOSPITAL_COMMUNITY): Payer: Medicare Other

## 2020-11-14 ENCOUNTER — Other Ambulatory Visit: Payer: Self-pay | Admitting: Infectious Diseases

## 2020-11-14 DIAGNOSIS — B9562 Methicillin resistant Staphylococcus aureus infection as the cause of diseases classified elsewhere: Secondary | ICD-10-CM

## 2020-11-14 DIAGNOSIS — R7881 Bacteremia: Secondary | ICD-10-CM

## 2020-11-14 DIAGNOSIS — L89324 Pressure ulcer of left buttock, stage 4: Secondary | ICD-10-CM

## 2020-11-14 DIAGNOSIS — I5033 Acute on chronic diastolic (congestive) heart failure: Secondary | ICD-10-CM | POA: Diagnosis not present

## 2020-11-14 DIAGNOSIS — I5031 Acute diastolic (congestive) heart failure: Secondary | ICD-10-CM

## 2020-11-14 LAB — BLOOD CULTURE ID PANEL (REFLEXED) - BCID2

## 2020-11-14 LAB — COMPREHENSIVE METABOLIC PANEL
ALT: 9 U/L (ref 0–44)
AST: 16 U/L (ref 15–41)
Albumin: 2.8 g/dL — ABNORMAL LOW (ref 3.5–5.0)
Alkaline Phosphatase: 80 U/L (ref 38–126)
Anion gap: 9 (ref 5–15)
BUN: 14 mg/dL (ref 8–23)
CO2: 41 mmol/L — ABNORMAL HIGH (ref 22–32)
Calcium: 8.8 mg/dL — ABNORMAL LOW (ref 8.9–10.3)
Chloride: 90 mmol/L — ABNORMAL LOW (ref 98–111)
Creatinine, Ser: 0.53 mg/dL (ref 0.44–1.00)
GFR, Estimated: >60 mL/min (ref >60)
Glucose, Bld: 172 mg/dL — ABNORMAL HIGH (ref 70–99)
Potassium: 3.7 mmol/L (ref 3.5–5.1)
Sodium: 140 mmol/L (ref 135–145)
Total Bilirubin: 1.3 mg/dL — ABNORMAL HIGH (ref 0.3–1.2)
Total Protein: 5.9 g/dL — ABNORMAL LOW (ref 6.5–8.1)

## 2020-11-14 LAB — CBC WITH DIFFERENTIAL/PLATELET
Abs Immature Granulocytes: 0.06 10*3/uL (ref 0.00–0.07)
Basophils Absolute: 0 10*3/uL (ref 0.0–0.1)
Basophils Relative: 1 %
Eosinophils Absolute: 0.1 10*3/uL (ref 0.0–0.5)
Eosinophils Relative: 1 %
HCT: 36.3 % (ref 36.0–46.0)
Hemoglobin: 10.5 g/dL — ABNORMAL LOW (ref 12.0–15.0)
Immature Granulocytes: 1 %
Lymphocytes Relative: 8 %
Lymphs Abs: 0.7 10*3/uL (ref 0.7–4.0)
MCH: 26.3 pg (ref 26.0–34.0)
MCHC: 28.9 g/dL — ABNORMAL LOW (ref 30.0–36.0)
MCV: 91 fL (ref 80.0–100.0)
Monocytes Absolute: 0.8 10*3/uL (ref 0.1–1.0)
Monocytes Relative: 10 %
Neutro Abs: 6.6 10*3/uL (ref 1.7–7.7)
Neutrophils Relative %: 79 %
Platelets: 140 10*3/uL — ABNORMAL LOW (ref 150–400)
RBC: 3.99 MIL/uL (ref 3.87–5.11)
RDW: 17.1 % — ABNORMAL HIGH (ref 11.5–15.5)
WBC: 8.3 10*3/uL (ref 4.0–10.5)
nRBC: 0 % (ref 0.0–0.2)

## 2020-11-14 LAB — GLUCOSE, CAPILLARY
Glucose-Capillary: 151 mg/dL — ABNORMAL HIGH (ref 70–99)
Glucose-Capillary: 213 mg/dL — ABNORMAL HIGH (ref 70–99)
Glucose-Capillary: 163 mg/dL — ABNORMAL HIGH (ref 70–99)
Glucose-Capillary: 131 mg/dL — ABNORMAL HIGH (ref 70–99)

## 2020-11-14 LAB — ECHOCARDIOGRAM LIMITED
AR max vel: 1.15 cm2
AV Area VTI: 1.14 cm2
AV Area mean vel: 1.2 cm2
AV Mean grad: 17 mmHg
AV Peak grad: 26.6 mmHg
Ao pk vel: 2.58 m/s
Height: 70 in
Weight: 4032 oz

## 2020-11-14 LAB — PROCALCITONIN: Procalcitonin: <0.1 ng/mL

## 2020-11-14 LAB — MAGNESIUM: Magnesium: 1.7 mg/dL (ref 1.7–2.4)

## 2020-11-14 LAB — BRAIN NATRIURETIC PEPTIDE: B Natriuretic Peptide: 402.6 pg/mL — ABNORMAL HIGH (ref 0.0–100.0)

## 2020-11-14 LAB — PROTIME-INR
INR: 1.8 — ABNORMAL HIGH (ref 0.8–1.2)
Prothrombin Time: 21.1 seconds — ABNORMAL HIGH (ref 11.4–15.2)

## 2020-11-14 MED ORDER — DIPHENHYDRAMINE HCL 50 MG/ML IJ SOLN: 25.0000 mg | Freq: Four times a day (QID) | INTRAMUSCULAR | Status: DC | PRN

## 2020-11-14 MED ORDER — SODIUM CHLORIDE 0.9 % IV SOLN
8.0000 mg/kg | Freq: Once | INTRAVENOUS | Status: AC
  Administered 2020-11-14: 850 mg via INTRAVENOUS
  Filled 2020-11-14: qty 17

## 2020-11-14 MED ORDER — IOHEXOL 350 MG/ML SOLN
100.0000 mL | Freq: Once | INTRAVENOUS | Status: AC | PRN
  Administered 2020-11-14: 100 mL via INTRAVENOUS

## 2020-11-14 MED ORDER — VANCOMYCIN HCL 1250 MG/250ML IV SOLN
1250.0000 mg | Freq: Two times a day (BID) | INTRAVENOUS | Status: DC
  Administered 2020-11-14 – 2020-11-15 (×2): 1250 mg via INTRAVENOUS
  Filled 2020-11-14 (×3): qty 250

## 2020-11-14 MED ORDER — WARFARIN SODIUM 2.5 MG PO TABS
12.5000 mg | ORAL_TABLET | Freq: Once | ORAL | Status: AC
  Administered 2020-11-14: 12.5 mg via ORAL
  Filled 2020-11-14: qty 1

## 2020-11-14 MED ORDER — SODIUM CHLORIDE 0.9 % IV SOLN: 8.0000 mg/kg | Freq: Every day | INTRAVENOUS | Status: DC

## 2020-11-14 MED ORDER — SODIUM CHLORIDE 0.9 % IV SOLN
3.0000 g | Freq: Four times a day (QID) | INTRAVENOUS | Status: DC
  Administered 2020-11-14: 3 mg via INTRAVENOUS
  Administered 2020-11-14 – 2020-11-20 (×25): 3 g via INTRAVENOUS
  Filled 2020-11-14 (×28): qty 8

## 2020-11-14 MED ORDER — DIPHENHYDRAMINE HCL 50 MG/ML IJ SOLN
25.0000 mg | Freq: Four times a day (QID) | INTRAMUSCULAR | Status: DC | PRN
Start: 2020-11-14 — End: 2020-11-14

## 2020-11-14 MED ORDER — SODIUM CHLORIDE 0.9 % IV SOLN
1.0000 g | INTRAVENOUS | Status: DC
  Filled 2020-11-14: qty 10

## 2020-11-14 MED ORDER — CHLORHEXIDINE GLUCONATE CLOTH 2 % EX PADS
6.0000 | MEDICATED_PAD | Freq: Every day | CUTANEOUS | Status: DC
  Administered 2020-11-14 – 2020-11-20 (×6): 6 via TOPICAL

## 2020-11-14 NOTE — Consult Note (Signed)
[facility] Nurse Consult Note: Reason for Consult: Consult requested for sacrum wound.  This was already performed by the [facility] team on 9/13 for a chronic Stage 4 pressure injury, and topical treatment orders have been provided for staff nurses to perform.  Please refer to previous progress notes for assessment and measurements.  Please re-consult if further assistance is needed.  Thank-you,  Julien Girt MSN, [place], [place], [place], [place]

## 2020-11-14 NOTE — Progress Notes (Signed)
PROGRESS NOTE                                                                                                                                                                                                             Patient Demographics:    Donna [name], is a 70 y.o. female, DOB - 1950/05/21, [id number]  Outpatient Primary MD for the patient is O'Buch, Alvis Lemmings, PA-C    LOS - 1  Admit date - 11/12/2020    Chief Complaint  Patient presents with   Altered Mental Status       Brief Narrative (HPI from H&P)   Donna [name] is a 70 y.o. female with history of diastolic CHF last EF measured in 2019 was 55 to 60% with history of A. fib diabetes mellitus type 2, anemia, sleep apnea Parkinson's disease bedbound with sacral ulcers was brought to the ER after patient was found to have increasing peripheral edema with shortness of breath, was dignosed with CHF and admitted to the hospital.   Subjective:   Patient in bed, appears comfortable, denies any headache, no fever, no chest pain or pressure, no shortness of breath , no abdominal pain. No new focal weakness, still waking up.   Assessment  & Plan :     Acute Hypoxic Resp. Failure due to Acute on chronic diastolic CHF last EF [id number] in 2019.  Agree with IV Lasix, dose increased, continue Aldactone and beta-blocker, continue supportive care with oxygen and monitor, TTE pending, symptoms are better.  Encouraged the patient to sit up in chair in the daytime use I-S and flutter valve for pulmonary toiletry.  Will advance activity and titrate down oxygen as possible.  SpO2: 100 % O2 Flow Rate (L/min): 3 L/min  2.  UTI.  Cultures noted, Rocephin, stopped Levaquin.  3.  OSA.  CPAP at night.  4.  Chronic anemia and thrombocytopenia.  Monitor.  5.  Sacral decubitus ulcers present on admission.  Foley if needed for hygiene, wound care team consulted. 1/4 MRSA +ve BC, as listed vancomycin  allergy received a dose of IV daptomycin on 11/13/2020.  Will defer to ID seeing the patient shortly.  6.  Underlying Parkinson's.  On combination of Sinemet and amantadine. ? Underlying dementia.  7.  Hypertension.  Propranolol and ARB combination along with diuretics.  8.  Paroxysmal  atrial fibrillation.  Mali vas 2 score of greater than 3.  On combination of propranolol and Coumadin.  Pharmacy monitoring INR.  9.  Obesity.  BMI 32.  Follow-up with PCP for weight loss.  10. DM type II.  On Lantus and sliding scale.  Will monitor and adjust.  Lab Results  Component Value Date   HGBA1C 6.0 (H) 04/03/2019    CBG (last 3)  Recent Labs    11/13/20 1624 11/13/20 2106 11/14/20 0602  GLUCAP 145* 164* 151*   Lab Results  Component Value Date   INR 1.8 (H) 11/14/2020   INR 1.7 (H) 11/13/2020   INR 1.8 (H) 11/12/2020     Recent Labs  Lab 11/12/20 1433 11/12/20 1500 11/12/20 1556 11/12/20 1945 11/13/20 0509 11/13/20 1600 11/14/20 0133 11/14/20 0452  WBC  --   --   --  7.5 6.2  --  8.3  --   HGB  --   --  12.6 10.6* 10.5*  --  10.5*  --   HCT  --   --  37.0 36.8 36.6  --  36.3  --   PLT  --   --   --  125* 126*  --  140*  --   BNP 412.3*  --   --   --   --   --  402.6*  --   PROCALCITON  --   --   --   --   --  <0.10  --  <0.10  AST 20  --   --   --   --   --  16  --   ALT 10  --   --   --   --   --  9  --   ALKPHOS 75  --   --   --   --   --  80  --   BILITOT 1.5*  --   --   --   --   --  1.3*  --   ALBUMIN 2.8*  --   --   --   --   --  2.8*  --   INR 1.8*  --   --   --  1.7*  --  1.8*  --   LATICACIDVEN  --  1.2  --   --   --   --   --   --   SARSCOV2NAA NEGATIVE  --   --   --   --   --   --   --            Condition - Fair  Family Communication  : Message left for patient's husband Broadus John [phone] on 10/20/2020 at 11:22 AM, called 11/14/20 at 9.45 am - no responce  Code Status :  Full  Consults  :  None  PUD Prophylaxis : PPI   Procedures  :      TTE  CT Head - Non acute      Disposition Plan  :    Status is: Inpatient  Remains inpatient appropriate because:IV treatments appropriate due to intensity of illness or inability to take PO  Dispo: The patient is from: SNF              Anticipated d/c is to: SNF              Patient currently is not medically stable to d/c.   Difficult to place patient No  DVT Prophylaxis  :  Coumadin  Lab Results  Component Value Date   INR 1.8 (H) 11/14/2020   INR 1.7 (H) 11/13/2020   INR 1.8 (H) 11/12/2020    Lab Results  Component Value Date   PLT 140 (L) 11/14/2020    Diet :  Diet Order             Diet heart healthy/carb modified Room service appropriate? Yes; Fluid consistency: Thin; Fluid restriction: 1200 mL Fluid  Diet effective now                    Inpatient Medications  Scheduled Meds:  amantadine  100 mg Oral Daily   buPROPion  150 mg Oral Daily   carbamazepine  100 mg Oral BID   carbidopa-levodopa  1 tablet Oral TID WC   Chlorhexidine Gluconate Cloth  6 each Topical Daily   ezetimibe  10 mg Oral QHS   ferrous sulfate  325 mg Oral BID WC   furosemide  60 mg Intravenous BID   gabapentin  300 mg Oral TID   insulin aspart  0-9 Units Subcutaneous TID WC   insulin glargine-yfgn  25 Units Subcutaneous QHS   losartan  12.5 mg Oral Daily   magnesium oxide  400 mg Oral BID   pantoprazole  40 mg Oral Daily   pramipexole  0.125 mg Oral QHS   propranolol  20 mg Oral TID   sertraline  100 mg Oral QHS   spironolactone  50 mg Oral Daily   vitamin B-12  500 mcg Oral Daily   Warfarin - Pharmacist Dosing Inpatient   Does not apply q1600   Continuous Infusions:   PRN Meds:.acetaminophen **OR** acetaminophen, ondansetron (ZOFRAN) IV  Antibiotics  :    Anti-infectives (From admission, onward)    Start     Dose/Rate Route Frequency Ordered Stop   11/14/20 0230  DAPTOmycin (CUBICIN) 850 mg in sodium chloride 0.9 % IVPB        8 mg/kg  104 kg 134 mL/hr over  30 Minutes Intravenous  Once 11/14/20 0130 11/14/20 0501   11/13/20 1500  levofloxacin (LEVAQUIN) IVPB 750 mg  Status:  Discontinued        750 mg 100 mL/hr over 90 Minutes Intravenous Every 24 hours 11/12/20 2204 11/14/20 0951   11/12/20 1500  levofloxacin (LEVAQUIN) IVPB 750 mg        750 mg 100 mL/hr over 90 Minutes Intravenous  Once 11/12/20 1454 11/12/20 1704        Time Spent in minutes  30   Lala Lund M.D on 11/14/2020 at 9:51 AM  To page go to [email or web address]   [facility] -  Office  [phone]  See all Orders from today for further details    Objective:   Vitals:   11/13/20 1944 11/14/20 0031 11/14/20 0442 11/14/20 0729  BP: (!) 102/59 106/71 111/67 129/70  Pulse: 92 71 86 91  Resp: (!) 22 (!) 22 20   Temp: 98.5 F (36.9 C) 98.6 F (37 C) 97.7 F (36.5 C) 98.1 F (36.7 C)  TempSrc: Oral Oral Oral Oral  SpO2: 100% 99% 99% 100%  Weight:   114.3 kg   Height:        Wt Readings from Last 3 Encounters:  11/14/20 114.3 kg  08/18/19 114.8 kg  05/11/19 114.8 kg     Intake/Output Summary (Last 24 hours) at 11/14/2020 0951 Last data filed at 11/14/2020 0610 Gross per 24 hour  Intake 1070.19 ml  Output 2250 ml  Net -1179.81 ml     Physical Exam  Awake mildly confused, woken up, moves all 4 extremities by self ,       [place].AT,PERRAL Supple Neck,No JVD, No cervical lymphadenopathy appriciated.  Symmetrical Chest wall movement, Good air movement bilaterally, CTAB RRR,No Gallops, Rubs or new Murmurs, No Parasternal Heave +ve B.Sounds, Abd Soft, No tenderness, No organomegaly appriciated, No rebound - guarding or rigidity. No Cyanosis, trace edema    RN pressure injury documentation: Pressure Injury 11/12/20 Sacrum Mid Stage 3 -  Full thickness tissue loss. Subcutaneous fat may be visible but bone, tendon or muscle are NOT exposed. red and pink bedding, tunneled. (Active)  11/12/20 1555  Location: Sacrum  Location Orientation: Mid  Staging:  Stage 3 -  Full thickness tissue loss. Subcutaneous fat may be visible but bone, tendon or muscle are NOT exposed.  Wound Description (Comments): red and pink bedding, tunneled.  Present on Admission: Yes     Data Review:    CBC Recent Labs  Lab 11/12/20 1556 11/12/20 1945 11/13/20 0509 11/14/20 0133  WBC  --  7.5 6.2 8.3  HGB 12.6 10.6* 10.5* 10.5*  HCT 37.0 36.8 36.6 36.3  PLT  --  125* 126* 140*  MCV  --  91.5 91.5 91.0  MCH  --  26.4 26.3 26.3  MCHC  --  28.8* 28.7* 28.9*  RDW  --  17.3* 17.4* 17.1*  LYMPHSABS  --  0.8  --  0.7  MONOABS  --  0.8  --  0.8  EOSABS  --  0.1  --  0.1  BASOSABS  --  0.0  --  0.0    Recent Labs  Lab 11/12/20 1433 11/12/20 1500 11/12/20 1556 11/13/20 0509 11/13/20 0709 11/13/20 1600 11/14/20 0133 11/14/20 0452  NA 139  --  143 140  --   --  140  --   K 3.5  --  3.3* 3.6  --   --  3.7  --   CL 88*  --   --  93*  --   --  90*  --   CO2 41*  --   --  40*  --   --  41*  --   GLUCOSE 138*  --   --  143*  --   --  172*  --   BUN 14  --   --  14  --   --  14  --   CREATININE 0.52  --   --  0.52  --   --  0.53  --   CALCIUM 8.3*  --   --  8.5*  --   --  8.8*  --   AST 20  --   --   --   --   --  16  --   ALT 10  --   --   --   --   --  9  --   ALKPHOS 75  --   --   --   --   --  80  --   BILITOT 1.5*  --   --   --   --   --  1.3*  --   ALBUMIN 2.8*  --   --   --   --   --  2.8*  --   MG 1.6*  --   --   --  1.9  --  1.7  --   PROCALCITON  --   --   --   --   --  <  0.10  --  <0.10  LATICACIDVEN  --  1.2  --   --   --   --   --   --   INR 1.8*  --   --  1.7*  --   --  1.8*  --   BNP 412.3*  --   --   --   --   --  402.6*  --     ------------------------------------------------------------------------------------------------------------------ No results for input(s): CHOL, HDL, LDLCALC, TRIG, CHOLHDL, LDLDIRECT in the last 72 hours.  Lab Results  Component Value Date   HGBA1C 6.0 (H) 04/03/2019    ------------------------------------------------------------------------------------------------------------------ No results for input(s): TSH, T4TOTAL, T3FREE, THYROIDAB in the last 72 hours.  Invalid input(s): FREET3  Cardiac Enzymes No results for input(s): CKMB, TROPONINI, MYOGLOBIN in the last 168 hours.  Invalid input(s): CK ------------------------------------------------------------------------------------------------------------------    Component Value Date/Time   BNP 402.6 (H) 11/14/2020 0133     Radiology Reports CT HEAD WO CONTRAST (5MM)  Result Date: 11/13/2020 CLINICAL DATA:  Altered mental status. EXAM: CT HEAD WITHOUT CONTRAST TECHNIQUE: Contiguous axial images were obtained from the base of the skull through the vertex without intravenous contrast. COMPARISON:  Jul 19, 2020 FINDINGS: Brain: There is mild cerebral atrophy with widening of the extra-axial spaces and ventricular dilatation. There are areas of decreased attenuation within the white matter tracts of the supratentorial brain, consistent with microvascular disease changes. Vascular: No hyperdense vessel or unexpected calcification. Skull: Normal. Negative for fracture or focal lesion. Sinuses/Orbits: No acute finding. Other: None. IMPRESSION: 1. Generalized cerebral atrophy. 2. No acute intracranial abnormality. Electronically Signed   By: Virgina Norfolk M.D.   On: 11/13/2020 03:39   DG Chest Port 1 View  Result Date: 11/13/2020 CLINICAL DATA:  SOB (shortness of breath) R06.02 (ICD-10-CM) EXAM: PORTABLE CHEST 1 VIEW COMPARISON:  11/12/2020. FINDINGS: Similar mild enlargement cardiac silhouette. Similar pulmonary vascular congestion. Similar bibasilar opacities and probable small right pleural effusion. Mild interstitial prominence, possibly mild interstitial edema. No visible pneumothorax IMPRESSION: No substantial change from the prior. Similar cardiomegaly, pulmonary vascular congestion, probable small  right pleural effusion and possible mild interstitial edema. Early pneumonia is not excluded at the lung bases with similar bibasilar opacities. Electronically Signed   By: Margaretha Sheffield M.D.   On: 11/13/2020 12:12   DG Chest Port 1 View  Result Date: 11/12/2020 CLINICAL DATA:  Questionable sepsis.  Evaluate for abnormality. EXAM: PORTABLE CHEST 1 VIEW COMPARISON:  Radiographs 04/06/2019 and 04/02/2019.  CT 07/19/2020. FINDINGS: 1556 hours. Right arm PICC has been removed. The heart remains enlarged. There is increased vascular congestion with patchy opacities at both lung bases and a probable small right pleural effusion. No evidence of pneumothorax. The bones appear unchanged. Telemetry leads overlie the chest. IMPRESSION: Cardiomegaly with vascular congestion, probable small right pleural effusion and probable mild edema. Cannot exclude early pneumonia at the lung bases. Radiographic follow up recommended. Electronically Signed   By: Richardean Sale M.D.   On: 11/12/2020 16:31

## 2020-11-14 NOTE — Progress Notes (Signed)
Donna [name] for Coumadin Indication: atrial fibrillation  Allergies  Allergen Reactions   Bee Venom Hives    Takes benadryl   Other Other (See Comments)    Raw foods with seeds give her "boils".  Avoids raw strawberries, blueberries. Tolerates cooked fruits, tomato sauce, bread/grains with seeds. [facility] 10/17/13 Berries with seeds Allergy to glutamine-C-quercet-selen-brom per [facility] 09/25/17   Zosyn [Piperacillin Sod-Tazobactam So] Rash    Itchy total body rash.   Alteplase Rash   Cefepime Rash   Fentanyl Other (See Comments)    Hallucinations, syncope     Metformin And Related Diarrhea   Vancomycin Rash    Patient Measurements: Height: '5\' 10"'$  (177.8 cm) Weight: 114.3 kg (252 lb) IBW/kg (Calculated) : 68.5  Vital Signs: Temp: 98.1 F (36.7 C) (09/14 0729) Temp Source: Oral (09/14 0729) BP: 129/70 (09/14 0729) Pulse Rate: 91 (09/14 0729)  Labs: Recent Labs    11/12/20 1433 11/12/20 1556 11/12/20 1945 11/13/20 0509 11/14/20 0133  HGB  --    < > 10.6* 10.5* 10.5*  HCT  --    < > 36.8 36.6 36.3  PLT  --   --  125* 126* 140*  APTT 31  --   --   --   --   LABPROT 20.7*  --   --  19.6* 21.1*  INR 1.8*  --   --  1.7* 1.8*  CREATININE 0.52  --   --  0.52 0.53   < > = values in this interval not displayed.     Estimated Creatinine Clearance: 90.9 mL/min (by C-G formula based on SCr of 0.53 mg/dL).  Assessment: 70 y.o. female admitted with CHF.  Pharmacy consulted to continue Coumadin for history of Afib.  Spoke to [place] staff, patient's Coumadin dose was increased on 9/12 to '11mg'$  PO daily.    INR sub-therapeutic and trended up to 1.8.  Hemoglobin and platelet count low; no bleeding reported.  Last dose of Levaquin 9/13.  Goal of Therapy:  INR 2-3 Monitor platelets by anticoagulation protocol: Yes   Plan:  Repeat Coumadin 12.'5mg'$  PO today Daily PT / INR  Donna [name] D. Mina Marble, PharmD, BCPS, [place] 11/14/2020, 12:13 PM

## 2020-11-14 NOTE — Progress Notes (Deleted)
Pharmacy Antibiotic Note  Donna [name] is a 71 y.o. female admitted on 11/12/2020 with AMS/CHF, now with MRSA bacteremia.  Pharmacy has been consulted for Vancomycin dosing.  Plan: Vancomycin 2000 mg IV now, then 1250 mg IV q12h  Height: '5\' 10"'$  (177.8 cm) Weight: 104 kg (229 lb 4.5 oz) IBW/kg (Calculated) : 68.5  Temp (24hrs), Avg:98.9 F (37.2 C), Min:98.2 F (36.8 C), Max:99.6 F (37.6 C)  Recent Labs  Lab 11/12/20 1433 11/12/20 1500 11/12/20 1945 11/13/20 0509  WBC  --   --  7.5 6.2  CREATININE 0.52  --   --  0.52  LATICACIDVEN  --  1.2  --   --     Estimated Creatinine Clearance: 86.6 mL/min (by C-G formula based on SCr of 0.52 mg/dL).    Allergies  Allergen Reactions   Bee Venom Hives    Takes benadryl   Other Other (See Comments)    Raw foods with seeds give her "boils".  Avoids raw strawberries, blueberries. Tolerates cooked fruits, tomato sauce, bread/grains with seeds. [facility] 10/17/13 Berries with seeds Allergy to glutamine-C-quercet-selen-brom per [facility] 09/25/17   Zosyn [Piperacillin Sod-Tazobactam So] Rash    Itchy total body rash.   Alteplase Rash   Cefepime Rash   Fentanyl Other (See Comments)    Hallucinations, syncope     Metformin And Related Diarrhea   Vancomycin Rash    Caryl Pina 11/14/2020 12:41 AM

## 2020-11-14 NOTE — Evaluation (Signed)
Occupational Therapy Evaluation and Discharge Patient Details Name: Donna [name] MRN: [id number] DOB: 1950/07/04 Today's Date: 11/14/2020   History of Present Illness Donna [name] is a 70 y.o. female admitted with increasing peripheral edema with shortness of breath and confusion. PMH:  diastolic CHF last EF measured in 2019 was 55 to 60% with history of A. fib, DMII, anemia, sleep apnea Parkinson's disease bedbound with sacral ulcers   Clinical Impression   This 70 yo female admitted with above presents to acute OT with  PLOF per chart of being bed bound and hoyered OOB. Pt can self feed with proper setup of food and positioning in bed or chair, needs min A for grooming. She is max A-total A for bed level bathing and dressing and based off her mobility with Pt yesterday this would be her baseline for B/D. She did show some cognitive issues with orientation. No further skilled OT needs, we will sign off.      Recommendations for follow up therapy are one component of a multi-disciplinary discharge planning process, led by the attending physician.  Recommendations may be updated based on patient status, additional functional criteria and insurance authorization.   Follow Up Recommendations  No OT follow up;Supervision/Assistance - 24 hour    Equipment Recommendations  None recommended by OT           Precautions / Restrictions Precautions Precautions: Fall Precaution Comments: many sacral ulcers Restrictions Weight Bearing Restrictions: No             ADL either performed or assessed with clinical judgement   ADL Overall ADL's : Needs assistance/impaired Eating/Feeding: Set up;Bed level Eating/Feeding Details (indicate cue type and reason): If positioned and set up properly (today she was too far down in the bed and the tray table was too low, but when I repositioned her and set up tray table better for her she could feed herself. Grooming: Wash/dry face;Oral care;Bed  level Grooming Details (indicate cue type and reason): min A due to having to hand her the items needed Upper Body Bathing: Maximal assistance;Bed level   Lower Body Bathing: Total assistance;Bed level   Upper Body Dressing : Total assistance;Bed level   Lower Body Dressing: Total assistance;Bed level                                           Pertinent Vitals/Pain Pain Assessment: No/denies pain     Hand Dominance Right   Extremity/Trunk Assessment Upper Extremity Assessment Upper Extremity Assessment: Generalized weakness (has enough AROM to self feed if set up and positioned properly as well as to wash face and brush teeth)           Communication Communication Communication: No difficulties   Cognition Arousal/Alertness: Awake/alert Behavior During Therapy: Flat affect Overall Cognitive Status: No family/caregiver present to determine baseline cognitive functioning                                 General Comments: Pt able to state year, but then perservated on this when asked the month. When asked where she was she said [place] (this was on the TV), when asked again she tried but then said she did not know. Followed all commands with UEs, self feeding, and grooming.  Home Living Family/patient expects to be discharged to:: Skilled nursing facility                                        Prior Functioning/Environment Level of Independence: Needs assistance  Gait / Transfers Assistance Needed: in w/c ADL's / Homemaking Assistance Needed: assist            OT Problem List: Decreased strength;Decreased range of motion;Impaired balance (sitting and/or standing);Obesity;Impaired UE functional use      OT Treatment/Interventions:      OT Goals(Current goals can be found in the care plan section) Acute Rehab OT Goals Patient Stated Goal: unable to state  OT Frequency:      AM-PAC OT "6 Clicks" Daily  Activity     Outcome Measure Help from another person eating meals?: A Little Help from another person taking care of personal grooming?: A Little Help from another person toileting, which includes using toliet, bedpan, or urinal?: Total Help from another person bathing (including washing, rinsing, drying)?: Total Help from another person to put on and taking off regular upper body clothing?: Total Help from another person to put on and taking off regular lower body clothing?: Total 6 Click Score: 10   End of Session    Activity Tolerance: Patient tolerated treatment well Patient left: in bed;with call bell/phone within reach;with bed alarm set  OT Visit Diagnosis: Other abnormalities of gait and mobility (R26.89);Muscle weakness (generalized) (M62.81);Other symptoms and signs involving cognitive function                Time: [id number] OT Time Calculation (min): 28 min Charges:  OT General Charges $OT Visit: 1 Visit OT Evaluation $OT Eval Moderate Complexity: 1 Mod OT Treatments $Self Care/Home Management : 8-22 mins  [address], OTR/L Acute [employer] Pager [phone] Office [phone]    Almon Register 11/14/2020, 10:37 AM

## 2020-11-14 NOTE — Telephone Encounter (Signed)
Per patient's request - will review H&P & progress notes.'  [facility]

## 2020-11-14 NOTE — NC FL2 (Signed)
[place] LEVEL OF CARE SCREENING TOOL     IDENTIFICATION  Patient Name: Donna [name] Birthdate: 1950/09/07 Sex: female Admission Date (Current Location): 11/12/2020  [facility] [place] Number:  Herbalist and Address:  [facility] [place], [place] [address], [place], [place] [address]      Provider Number: [id number]  Attending Physician Name and Address:  Thurnell Lose, MD  Relative Name and Phone Number:  Egelston,Joseph (Spouse)   [phone]    Current Level of Care: Hospital Recommended Level of Care: [place] Prior Approval Number:    Date Approved/Denied:   PASRR Number:    Discharge Plan: SNF    Current Diagnoses: Patient Active Problem List   Diagnosis Date Noted   MRSA bacteremia 11/14/2020   Acute on chronic diastolic CHF (congestive heart failure) ([place]) 11/13/2020   Acute CHF (congestive heart failure) ([place]) 11/13/2020   Rash and nonspecific skin eruption    Hypersensitivity 04/09/2019   Sacral osteomyelitis ([place]) 04/08/2019   Allergic urticaria 04/08/2019   Orthostatic hypotension    Decubitus ulcer of ischium, left, stage IV ([place]) 09/28/2017   Atrial fibrillation with RVR ([place]) 09/25/2017   Normocytic anemia 09/25/2017   Type 2 diabetes mellitus ([place]) 09/25/2017   Chronic pain 09/25/2017   History of seizures 09/03/2017   Depression with anxiety 02/09/2017   Venous stasis of both lower extremities 07/25/2016   Essential hypertension 07/25/2016   Anticoagulant long-term use 07/15/2016   Anatomical narrow angle borderline glaucoma of both eyes 04/02/2016   Chronic diastolic CHF (congestive heart failure) ([place]) 02/08/2016   Permanent atrial fibrillation ([place]): CHA2DS2-VASc Score 5; on Warfarin 02/08/2016   Morbid obesity ([place]) 02/08/2016   OSA (obstructive sleep apnea) 02/08/2016   Fibromyalgia 08/07/2012   Restless legs syndrome 08/07/2012    Orientation RESPIRATION BLADDER Height & Weight      Self  O2 Incontinent, External catheter Weight: 252 lb (114.3 kg) Height:  '5\' 10"'$  (177.8 cm)  BEHAVIORAL SYMPTOMS/MOOD NEUROLOGICAL BOWEL NUTRITION STATUS      Continent Diet (See DC Summary)  AMBULATORY STATUS COMMUNICATION OF NEEDS Skin   Extensive Assist Verbally Surgical wounds (Sacrum Mid stage 4)                       Personal Care Assistance Level of Assistance  Bathing, Feeding, Dressing Bathing Assistance: Maximum assistance Feeding assistance: Independent Dressing Assistance: Maximum assistance     Functional Limitations Info  Sight, Hearing, Speech Sight Info: Impaired Hearing Info: Adequate Speech Info: Adequate    SPECIAL CARE FACTORS FREQUENCY  PT (By licensed PT), OT (By licensed OT)     PT Frequency: 5x a week OT Frequency: 5x a week            Contractures Contractures Info: Not present    Additional Factors Info  Code Status, Allergies Code Status Info: Full Allergies Info: Bee Venom   Other   Zosyn (Piperacillin Sod-tazobactam So)   Alteplase   Cefepime   Fentanyl   Metformin And Related   Vancomycin           Current Medications (11/14/2020):  This is the current hospital active medication list Current Facility-Administered Medications  Medication Dose Route Frequency Provider Last Rate Last Admin   acetaminophen (TYLENOL) tablet 650 mg  650 mg Oral Q6H PRN Rise Patience, MD       Or   acetaminophen (TYLENOL) suppository 650 mg  650 mg  Rectal Q6H PRN Rise Patience, MD       amantadine (SYMMETREL) capsule 100 mg  100 mg Oral Daily Rise Patience, MD   100 mg at 11/14/20 1007   Ampicillin-Sulbactam (UNASYN) 3 g in sodium chloride 0.9 % 100 mL IVPB  3 g Intravenous Q6H Vu, Trung T, MD 200 mL/hr at 11/14/20 1049 3 g at 11/14/20 1049   buPROPion (WELLBUTRIN XL) 24 hr tablet 150 mg  150 mg Oral Daily Rise Patience, MD   150 mg at 11/14/20 1006   carbamazepine (TEGRETOL XR) 12 hr tablet 100 mg  100 mg Oral BID  Rise Patience, MD   100 mg at 11/14/20 1007   carbidopa-levodopa (SINEMET IR) 10-100 MG per tablet immediate release 1 tablet  1 tablet Oral TID WC Rise Patience, MD   1 tablet at 11/14/20 1007   Chlorhexidine Gluconate Cloth 2 % PADS 6 each  6 each Topical Daily Thurnell Lose, MD   6 each at 11/14/20 1041   [START ON 11/15/2020] DAPTOmycin (CUBICIN) 700 mg in sodium chloride 0.9 % IVPB  8 mg/kg (Adjusted) Intravenous Q2000 Vu, Trung T, MD       diphenhydrAMINE (BENADRYL) injection 25 mg  25 mg Intravenous Q6H PRN Thurnell Lose, MD       ezetimibe (ZETIA) tablet 10 mg  10 mg Oral QHS Lala Lund K, MD   10 mg at 11/13/20 2216   ferrous sulfate tablet 325 mg  325 mg Oral BID WC Rise Patience, MD   325 mg at 11/14/20 1005   furosemide (LASIX) injection 60 mg  60 mg Intravenous BID Thurnell Lose, MD   60 mg at 11/14/20 1008   gabapentin (NEURONTIN) capsule 300 mg  300 mg Oral TID Rise Patience, MD   300 mg at 11/14/20 1006   insulin aspart (novoLOG) injection 0-9 Units  0-9 Units Subcutaneous TID WC Rise Patience, MD   1 Units at 11/13/20 1707   insulin glargine-yfgn (SEMGLEE) injection 25 Units  25 Units Subcutaneous QHS Thurnell Lose, MD   25 Units at 11/13/20 2321   losartan (COZAAR) tablet 12.5 mg  12.5 mg Oral Daily Rise Patience, MD   12.5 mg at 11/14/20 1006   magnesium oxide (MAG-OX) tablet 400 mg  400 mg Oral BID Rise Patience, MD   400 mg at 11/14/20 1006   ondansetron (ZOFRAN) injection 4 mg  4 mg Intravenous Q8H PRN Thurnell Lose, MD   4 mg at 11/13/20 1708   pantoprazole (PROTONIX) EC tablet 40 mg  40 mg Oral Daily Thurnell Lose, MD   40 mg at 11/14/20 1006   pramipexole (MIRAPEX) tablet 0.125 mg  0.125 mg Oral QHS Lala Lund K, MD   0.125 mg at 11/13/20 2217   propranolol (INDERAL) tablet 20 mg  20 mg Oral TID Thurnell Lose, MD   20 mg at 11/14/20 1008   sertraline (ZOLOFT) tablet 100 mg  100 mg Oral QHS  Thurnell Lose, MD   100 mg at 11/13/20 2216   spironolactone (ALDACTONE) tablet 50 mg  50 mg Oral Daily Rise Patience, MD   50 mg at 11/14/20 1024   vitamin B-12 (CYANOCOBALAMIN) tablet 500 mcg  500 mcg Oral Daily Rise Patience, MD   500 mcg at 11/14/20 1005   Warfarin - Pharmacist Dosing Inpatient   Does not apply q1600 Rise Patience, MD  Discharge Medications: Please see discharge summary for a list of discharge medications.  Relevant Imaging Results:  Relevant Lab Results:   Additional Information SS# [id number];  Reece Agar, LCSWA

## 2020-11-14 NOTE — Consult Note (Signed)
[name] for Infectious Disease    Date of Admission:  11/12/2020     Total days of antibiotics 1   Levaquin  Daptomycin               Reason for Consult: MRSA bacteremia     Referring Provider: Auto consultation  Primary Care Provider: Janine Limbo, PA-C   Assessment: Donna [name] is a 70 y.o. female admitted from [facility] with AMS, increased peripheral edema. Found to have fever, MRSA bacteremia 1/4 bottles preliminarily, chronic sacral wound with malodorous drainage.    Would recommend CT scan of the pelvis to ensure no abscess in the setting of draining wound. Would recommend to leave gram negative coverage given likely polymicrobial involvement, including anaerobes. Does not appear to have any bladder symptoms currently but unasyn will also cover providencia.   For her staphylococcus aureus bacteremia she has a transthoracic echocardiogram to assess for endocarditis - on exam in baseline AFib. Scar overlying Rt knee appears c/w joint replacement - this appears benign today. Source most likely from her purulent wound. Repeat blood cultures ordered to prove clearance. Hold on PICC line for now.   H/O rash on vancomycin - upon historical chart review it appears she came into the hospital with full body rash prior to initiating vancomycin. I think we are safe to re-challenge this for her to treat current infection. Also unlikely SNF will accept daptomycin for further treatment outpatient - will see if CM can check with [facility] to see if this is possibility for overall safer treatment for her.     Plan: Change to vancomycin and follow for s/e Unasyn for wound/urine  Checking with [facility] re: daptomycin possibility  CT scan of pelvis to r/o abscess Repeat blood cultures in AM Follow TTE result  Please hold on any PICC line if possible until blood clears    Principal Problem:   MRSA bacteremia Active Problems:   Permanent atrial fibrillation ([place]):  CHA2DS2-VASc Score 5; on Warfarin   OSA (obstructive sleep apnea)   Essential hypertension   Type 2 diabetes mellitus (HCC)   Decubitus ulcer of ischium, left, stage IV (HCC)   Acute on chronic diastolic CHF (congestive heart failure) (HCC)   Acute CHF (congestive heart failure) (HCC)    amantadine  100 mg Oral Daily   buPROPion  150 mg Oral Daily   carbamazepine  100 mg Oral BID   carbidopa-levodopa  1 tablet Oral TID WC   Chlorhexidine Gluconate Cloth  6 each Topical Daily   ezetimibe  10 mg Oral QHS   ferrous sulfate  325 mg Oral BID WC   furosemide  60 mg Intravenous BID   gabapentin  300 mg Oral TID   insulin aspart  0-9 Units Subcutaneous TID WC   insulin glargine-yfgn  25 Units Subcutaneous QHS   losartan  12.5 mg Oral Daily   magnesium oxide  400 mg Oral BID   pantoprazole  40 mg Oral Daily   pramipexole  0.125 mg Oral QHS   propranolol  20 mg Oral TID   sertraline  100 mg Oral QHS   spironolactone  50 mg Oral Daily   vitamin B-12  500 mcg Oral Daily   Warfarin - Pharmacist Dosing Inpatient   Does not apply q1600    HPI: Donna [name] is a 70 y.o. female admitted to hospital from SNF with AMS and increased lower extremity edema.   PMHx detailed  below, significant for Parkinsons, CHF, T2DM, chronic sacral decubitus ulcer.   She says at first she is in [place] (watching the [employer]) but makes quick attempts to correct herself. She says she has no acutely worsening pain anywhere - chronic back pain. No trouble with knee pain (h/o knee surgery Rt). She does not recall much prior to admission but says she has dealt with this sacral ulcer since December 2021 she thinks.   Chart review - arrived from [facility] for AMS that started day of admission 9/13 along with increased LE edema x 2 weeks.  Initially with increased oxygen requirement 3 --> 6 LPM She did have a low grade temperature 100 F, normotensive. "Recently known urinary tract infection.  Solomnent, hypoxic and ill-appearing" Initially started on levaquin for UTI / psb pna. Blood cultures growing MRSA in 1/4 bottles. Chronic sacral ulceration documented in the chart.   Review of Systems: Review of Systems  Unable to perform ROS: Mental status change   Past Medical History:  Diagnosis Date   CHF (congestive heart failure) ([place])    Chronic /permanent atrial fibrillation ([place]) 2005   On Warfarin (follwed @ [facility]); Rate controlled - On BB   Chronic diastolic heart failure, NYHA class 2 (HCC)    Updated by A. fib, hypertensive heart disease and obesity; EDP was only 7 by cardiac catheterization in September 2017   Diabetic lumbosacral plexopathy (HCC)    Fibromyalgia    Generalized anxiety disorder    Hypokalemia 09/03/2017   Incontinence    Major depressive disorder    OSA on CPAP    Parkinson's disease ([place])    Restless leg syndrome    Syncope 09/03/2017   Type II diabetes mellitus (HCC)     Social History   Tobacco Use   Smoking status: Never   Smokeless tobacco: Never  Vaping Use   Vaping Use: Never used  Substance Use Topics   Alcohol use: No   Drug use: No    Family History  Problem Relation Age of Onset   Hypertension Mother    Heart disease Mother    Leukemia Son    Allergies  Allergen Reactions   Bee Venom Hives    Takes benadryl   Other Other (See Comments)    Raw foods with seeds give her "boils".  Avoids raw strawberries, blueberries. Tolerates cooked fruits, tomato sauce, bread/grains with seeds. [facility] 10/17/13 Berries with seeds Allergy to glutamine-C-quercet-selen-brom per [facility] 09/25/17   Zosyn [Piperacillin Sod-Tazobactam So] Rash    Itchy total body rash.   Alteplase Rash   Cefepime Rash   Fentanyl Other (See Comments)    Hallucinations, syncope     Metformin And Related Diarrhea   Vancomycin Rash    OBJECTIVE: Blood pressure 129/70, pulse 91, temperature 98.1 F (36.7 C), temperature source Oral,  resp. rate 20, height '5\' 10"'$  (1.778 m), weight 114.3 kg, SpO2 100 %.  Physical Exam Vitals reviewed.  Constitutional:      Appearance: She is obese. Ill appearance: chronically ill appearing.  HENT:     Mouth/Throat:     Mouth: Mucous membranes are moist.     Pharynx: Oropharynx is clear.  Eyes:     Conjunctiva/sclera: Conjunctivae normal.     Pupils: Pupils are equal, round, and reactive to light.  Cardiovascular:     Rate and Rhythm: Normal rate. Rhythm irregular.  Pulmonary:     Effort: Pulmonary effort is normal.  Breath sounds: Normal breath sounds.  Abdominal:     General: Bowel sounds are normal. There is no distension.     Palpations: Abdomen is soft.  Musculoskeletal:     Cervical back: Normal range of motion.  Skin:    General: Skin is warm and dry.     Comments: Large sacral ulceration with hyperemic/erythematous skin spanning moderately beyond wound boarders. There is a very foul odor and purulent drainage from the center of the wound. Allevyn dressing is completely saturated with thin brown drainage.   Lower Rt buttocks/ischium with 2 skin tears and some brown drainage.   Neurological:     Mental Status: She is alert.    Lab Results Lab Results  Component Value Date   WBC 8.3 11/14/2020   HGB 10.5 (L) 11/14/2020   HCT 36.3 11/14/2020   MCV 91.0 11/14/2020   PLT 140 (L) 11/14/2020    Lab Results  Component Value Date   CREATININE 0.53 11/14/2020   BUN 14 11/14/2020   NA 140 11/14/2020   K 3.7 11/14/2020   CL 90 (L) 11/14/2020   CO2 41 (H) 11/14/2020    Lab Results  Component Value Date   ALT 9 11/14/2020   AST 16 11/14/2020   ALKPHOS 80 11/14/2020   BILITOT 1.3 (H) 11/14/2020     Microbiology: Recent Results (from the past 240 hour(s))  Resp Panel by RT-PCR (Flu A&B, Covid) Nasopharyngeal Swab     Status: None   Collection Time: 11/12/20  2:33 PM   Specimen: Nasopharyngeal Swab; Nasopharyngeal(NP) swabs in vial transport medium  Result  Value Ref Range Status   SARS Coronavirus 2 by RT PCR NEGATIVE NEGATIVE Final    Comment: (NOTE) SARS-CoV-2 target nucleic acids are NOT DETECTED.  The SARS-CoV-2 RNA is generally detectable in upper respiratory specimens during the acute phase of infection. The lowest concentration of SARS-CoV-2 viral copies this assay can detect is 138 copies/mL. A negative result does not preclude SARS-Cov-2 infection and should not be used as the sole basis for treatment or other patient management decisions. A negative result may occur with  improper specimen collection/handling, submission of specimen other than nasopharyngeal swab, presence of viral mutation(s) within the areas targeted by this assay, and inadequate number of viral copies(<138 copies/mL). A negative result must be combined with clinical observations, patient history, and epidemiological information. The expected result is Negative.  Fact Sheet for Patients:  EntrepreneurPulse.com.au  Fact Sheet for Healthcare Providers:  IncredibleEmployment.be  This test is no t yet approved or cleared by the [place] FDA and  has been authorized for detection and/or diagnosis of SARS-CoV-2 by FDA under an Emergency Use Authorization (EUA). This EUA will remain  in effect (meaning this test can be used) for the duration of the COVID-19 declaration under Section 564(b)(1) of the Act, 21 U.S.C.section 360bbb-3(b)(1), unless the authorization is terminated  or revoked sooner.       Influenza A by PCR NEGATIVE NEGATIVE Final   Influenza B by PCR NEGATIVE NEGATIVE Final    Comment: (NOTE) The Xpert Xpress SARS-CoV-2/FLU/RSV plus assay is intended as an aid in the diagnosis of influenza from Nasopharyngeal swab specimens and should not be used as a sole basis for treatment. Nasal washings and aspirates are unacceptable for Xpert Xpress SARS-CoV-2/FLU/RSV testing.  Fact Sheet for  Patients: EntrepreneurPulse.com.au  Fact Sheet for Healthcare Providers: IncredibleEmployment.be  This test is not yet approved or cleared by the [place] and has been authorized for  detection and/or diagnosis of SARS-CoV-2 by FDA under an Emergency Use Authorization (EUA). This EUA will remain in effect (meaning this test can be used) for the duration of the COVID-19 declaration under Section 564(b)(1) of the Act, 21 U.S.C. section 360bbb-3(b)(1), unless the authorization is terminated or revoked.  Performed at [facility], [place] [address] [place], [place] [address]   Blood Culture (routine x 2)     Status: Abnormal (Preliminary result)   Collection Time: 11/12/20  3:00 PM   Specimen: BLOOD  Result Value Ref Range Status   Specimen Description BLOOD LEFT ANTECUBITAL  Final   Special Requests   Final    BOTTLES DRAWN AEROBIC AND ANAEROBIC Blood Culture results may not be optimal due to an excessive volume of blood received in culture bottles   Culture  Setup Time   Final    GRAM POSITIVE COCCI IN CLUSTERS ANAEROBIC BOTTLE ONLY CRITICAL RESULT CALLED TO, READ BACK BY AND VERIFIED WITH: PHARMD GREG ABBOTT 11/14/2020 '@0021'$  BY JW    Culture (A)  Final    STAPHYLOCOCCUS AUREUS CULTURE REINCUBATED FOR BETTER GROWTH Performed at [facility], [place] [address], [place], [place] [address]    Report Status PENDING  Incomplete  Urine Culture     Status: Abnormal (Preliminary result)   Collection Time: 11/12/20  3:00 PM   Specimen: In/Out Cath Urine  Result Value Ref Range Status   Specimen Description IN/OUT CATH URINE  Final   Special Requests Normal  Final   Culture (A)  Final    >=100,000 COLONIES/mL PROVIDENCIA STUARTII CULTURE REINCUBATED FOR BETTER GROWTH Performed at [facility], [place] [address], [place], [place] [address]    Report Status PENDING  Incomplete   Organism ID, Bacteria PROVIDENCIA STUARTII (A)  Final       Susceptibility   Providencia stuartii - MIC*    AMPICILLIN RESISTANT Resistant     CEFAZOLIN >=64 RESISTANT Resistant     CEFEPIME <=0.12 SENSITIVE Sensitive     CEFTRIAXONE <=0.25 SENSITIVE Sensitive     CIPROFLOXACIN >=4 RESISTANT Resistant     GENTAMICIN RESISTANT Resistant     IMIPENEM 2 SENSITIVE Sensitive     NITROFURANTOIN 256 RESISTANT Resistant     TRIMETH/SULFA <=20 SENSITIVE Sensitive     AMPICILLIN/SULBACTAM 4 SENSITIVE Sensitive     PIP/TAZO <=4 SENSITIVE Sensitive     * >=100,000 COLONIES/mL PROVIDENCIA STUARTII  Blood Culture ID Panel (Reflexed)     Status: Abnormal   Collection Time: 11/12/20  3:00 PM  Result Value Ref Range Status   Enterococcus faecalis NOT DETECTED NOT DETECTED Final   Enterococcus Faecium NOT DETECTED NOT DETECTED Final   Listeria monocytogenes NOT DETECTED NOT DETECTED Final   Staphylococcus species DETECTED (A) NOT DETECTED Final    Comment: CRITICAL RESULT CALLED TO, READ BACK BY AND VERIFIED WITH: PHARMD GREG ABBOTT 11/14/2020 '@0021'$  BY JW    Staphylococcus aureus (BCID) DETECTED (A) NOT DETECTED Final    Comment: Methicillin (oxacillin)-resistant Staphylococcus aureus (MRSA). MRSA is predictably resistant to beta-lactam antibiotics (except ceftaroline). Preferred therapy is vancomycin unless clinically contraindicated. Patient requires contact precautions if  hospitalized. CRITICAL RESULT CALLED TO, READ BACK BY AND VERIFIED WITH: PHARMD GREG ABBOTT 11/14/2020 '@0021'$  BY JW    Staphylococcus epidermidis NOT DETECTED NOT DETECTED Final   Staphylococcus lugdunensis NOT DETECTED NOT DETECTED Final   Streptococcus species NOT DETECTED NOT DETECTED Final   Streptococcus agalactiae NOT DETECTED NOT DETECTED Final   Streptococcus pneumoniae  NOT DETECTED NOT DETECTED Final   Streptococcus pyogenes NOT DETECTED NOT DETECTED Final   A.calcoaceticus-baumannii NOT DETECTED NOT DETECTED Final   Bacteroides fragilis NOT DETECTED NOT DETECTED Final    Enterobacterales NOT DETECTED NOT DETECTED Final   Enterobacter cloacae complex NOT DETECTED NOT DETECTED Final   Escherichia coli NOT DETECTED NOT DETECTED Final   Klebsiella aerogenes NOT DETECTED NOT DETECTED Final   Klebsiella oxytoca NOT DETECTED NOT DETECTED Final   Klebsiella pneumoniae NOT DETECTED NOT DETECTED Final   Proteus species NOT DETECTED NOT DETECTED Final   Salmonella species NOT DETECTED NOT DETECTED Final   Serratia marcescens NOT DETECTED NOT DETECTED Final   Haemophilus influenzae NOT DETECTED NOT DETECTED Final   Neisseria meningitidis NOT DETECTED NOT DETECTED Final   Pseudomonas aeruginosa NOT DETECTED NOT DETECTED Final   Stenotrophomonas maltophilia NOT DETECTED NOT DETECTED Final   Candida albicans NOT DETECTED NOT DETECTED Final   Candida auris NOT DETECTED NOT DETECTED Final   Candida glabrata NOT DETECTED NOT DETECTED Final   Candida krusei NOT DETECTED NOT DETECTED Final   Candida parapsilosis NOT DETECTED NOT DETECTED Final   Candida tropicalis NOT DETECTED NOT DETECTED Final   Cryptococcus neoformans/gattii NOT DETECTED NOT DETECTED Final   Meth resistant mecA/C and MREJ DETECTED (A) NOT DETECTED Final    Comment: CRITICAL RESULT CALLED TO, READ BACK BY AND VERIFIED WITH: PHARMD GREG ABBOTT 11/14/2020 '@0021'$  BY JW Performed at [facility], [address] [place], [place] [address]   Blood Culture (routine x 2)     Status: None (Preliminary result)   Collection Time: 11/12/20  3:21 PM   Specimen: BLOOD RIGHT HAND  Result Value Ref Range Status   Specimen Description BLOOD RIGHT HAND  Final   Special Requests   Final    BOTTLES DRAWN AEROBIC AND ANAEROBIC Blood Culture results may not be optimal due to an inadequate volume of blood received in culture bottles   Culture   Final    NO GROWTH 2 DAYS Performed at [facility], [place] [address], [place], [place] [address]    Report Status PENDING  Incomplete    Janene Madeira, MSN,  NP-C [place] for Infectious [place] Pager: [phone]  11/14/2020 11:25 AM

## 2020-11-14 NOTE — Progress Notes (Addendum)
PHARMACY - PHYSICIAN COMMUNICATION CRITICAL VALUE ALERT - BLOOD CULTURE IDENTIFICATION (BCID)  Donna [name] is an 70 y.o. female who presented to [facility] on 11/12/2020 with a chief complaint of AMS and CHF.    Assessment:   1 of 2 blood cultures growing MRSA  Name of physician (or Provider) Contacted:  Dr. Tonie Griffith  Current antibiotics:  Levaquin for GNR UTI  Changes to prescribed antibiotics recommended:   Add Vancomycin    Results for orders placed or performed during the hospital encounter of 11/12/20  Blood Culture ID Panel (Reflexed) (Collected: 11/12/2020  3:00 PM)  Result Value Ref Range   Enterococcus faecalis NOT DETECTED NOT DETECTED   Enterococcus Faecium NOT DETECTED NOT DETECTED   Listeria monocytogenes NOT DETECTED NOT DETECTED   Staphylococcus species DETECTED (A) NOT DETECTED   Staphylococcus aureus (BCID) DETECTED (A) NOT DETECTED   Staphylococcus epidermidis NOT DETECTED NOT DETECTED   Staphylococcus lugdunensis NOT DETECTED NOT DETECTED   Streptococcus species NOT DETECTED NOT DETECTED   Streptococcus agalactiae NOT DETECTED NOT DETECTED   Streptococcus pneumoniae NOT DETECTED NOT DETECTED   Streptococcus pyogenes NOT DETECTED NOT DETECTED   A.calcoaceticus-baumannii NOT DETECTED NOT DETECTED   Bacteroides fragilis NOT DETECTED NOT DETECTED   Enterobacterales NOT DETECTED NOT DETECTED   Enterobacter cloacae complex NOT DETECTED NOT DETECTED   Escherichia coli NOT DETECTED NOT DETECTED   Klebsiella aerogenes NOT DETECTED NOT DETECTED   Klebsiella oxytoca NOT DETECTED NOT DETECTED   Klebsiella pneumoniae NOT DETECTED NOT DETECTED   Proteus species NOT DETECTED NOT DETECTED   Salmonella species NOT DETECTED NOT DETECTED   Serratia marcescens NOT DETECTED NOT DETECTED   Haemophilus influenzae NOT DETECTED NOT DETECTED   Neisseria meningitidis NOT DETECTED NOT DETECTED   Pseudomonas aeruginosa NOT DETECTED NOT DETECTED   Stenotrophomonas maltophilia NOT  DETECTED NOT DETECTED   Candida albicans NOT DETECTED NOT DETECTED   Candida auris NOT DETECTED NOT DETECTED   Candida glabrata NOT DETECTED NOT DETECTED   Candida krusei NOT DETECTED NOT DETECTED   Candida parapsilosis NOT DETECTED NOT DETECTED   Candida tropicalis NOT DETECTED NOT DETECTED   Cryptococcus neoformans/gattii NOT DETECTED NOT DETECTED   Meth resistant mecA/C and MREJ DETECTED (A) NOT DETECTED     Caryl Pina 11/14/2020  12:25 AM   Addendum:   Vancomycin allergy noted.  Discussed with Dr. Tonie Griffith.  Will give Daptomycin 8 mg IV x 1 now, F/U with ID in am  Phillis Knack, PharmD, BCPS 11/14/2020 1:30 AM

## 2020-11-14 NOTE — Progress Notes (Signed)
Pharmacy Antibiotic Note  Donna [name] is a 70 y.o. female admitted on 11/12/2020 with bacteremia.  Pharmacy has been consulted for vancomycin dosing.  Donna [name] is a 70 y.o. female admitted from [facility] with AMS, increased peripheral edema. Found to have fever, MRSA bacteremia 1/4 bottles preliminarily, chronic sacral wound with malodorous drainage.    Plan: Start Vancomycin '1250mg'$  IV q12h (eAUC 463, Scr 0.53) Goal AUC 400-550 Monitor renal function, clinical course, and micro data  Height: '5\' 10"'$  (177.8 cm) Weight: 114.3 kg (252 lb) IBW/kg (Calculated) : 68.5  Temp (24hrs), Avg:98.4 F (36.9 C), Min:97.7 F (36.5 C), Max:98.9 F (37.2 C)  Recent Labs  Lab 11/12/20 1433 11/12/20 1500 11/12/20 1945 11/13/20 0509 11/14/20 0133  WBC  --   --  7.5 6.2 8.3  CREATININE 0.52  --   --  0.52 0.53  LATICACIDVEN  --  1.2  --   --   --     Estimated Creatinine Clearance: 90.9 mL/min (by C-G formula based on SCr of 0.53 mg/dL).    Allergies  Allergen Reactions   Bee Venom Hives    Takes benadryl   Other Other (See Comments)    Raw foods with seeds give her "boils".  Avoids raw strawberries, blueberries. Tolerates cooked fruits, tomato sauce, bread/grains with seeds. [facility] 10/17/13 Berries with seeds Allergy to glutamine-C-quercet-selen-brom per [facility] 09/25/17   Zosyn [Piperacillin Sod-Tazobactam So] Rash    Itchy total body rash.   Alteplase Rash   Cefepime Rash   Fentanyl Other (See Comments)    Hallucinations, syncope     Metformin And Related Diarrhea   Vancomycin Rash    Antimicrobials this admission: Daptomcyin 9/14 x1 dose Levofloxacin 9/12 >> 9/13  Dose adjustments this admission:  Microbiology results: 9/15 BCx: MRSA 9/12 UCx: Providencia stuartii   Thank you for allowing pharmacy to be a part of this patient's care.  Lestine Box, PharmD PGY2 Infectious Diseases Pharmacy Resident   Please check [email or web address] for unit-specific pharmacy phone  numbers

## 2020-11-14 NOTE — Progress Notes (Signed)
Echocardiogram 2D Echocardiogram has been performed.  Oneal Deputy Sidnee Gambrill RDCS 11/14/2020, 3:22 PM

## 2020-11-15 ENCOUNTER — Ambulatory Visit: Payer: Medicare Other | Admitting: Physician Assistant

## 2020-11-15 DIAGNOSIS — I5033 Acute on chronic diastolic (congestive) heart failure: Secondary | ICD-10-CM | POA: Diagnosis not present

## 2020-11-15 DIAGNOSIS — L89324 Pressure ulcer of left buttock, stage 4: Secondary | ICD-10-CM | POA: Diagnosis not present

## 2020-11-15 DIAGNOSIS — B9562 Methicillin resistant Staphylococcus aureus infection as the cause of diseases classified elsewhere: Secondary | ICD-10-CM | POA: Diagnosis not present

## 2020-11-15 DIAGNOSIS — R7881 Bacteremia: Secondary | ICD-10-CM | POA: Diagnosis not present

## 2020-11-15 LAB — COMPREHENSIVE METABOLIC PANEL
ALT: 8 U/L (ref 0–44)
AST: 15 U/L (ref 15–41)
Albumin: 2.8 g/dL — ABNORMAL LOW (ref 3.5–5.0)
Alkaline Phosphatase: 75 U/L (ref 38–126)
Anion gap: 8 (ref 5–15)
BUN: 15 mg/dL (ref 8–23)
CO2: 42 mmol/L — ABNORMAL HIGH (ref 22–32)
Calcium: 8.6 mg/dL — ABNORMAL LOW (ref 8.9–10.3)
Chloride: 91 mmol/L — ABNORMAL LOW (ref 98–111)
Creatinine, Ser: 0.59 mg/dL (ref 0.44–1.00)
GFR, Estimated: >60 mL/min (ref >60)
Glucose, Bld: 145 mg/dL — ABNORMAL HIGH (ref 70–99)
Potassium: 3.6 mmol/L (ref 3.5–5.1)
Sodium: 141 mmol/L (ref 135–145)
Total Bilirubin: 1.1 mg/dL (ref 0.3–1.2)
Total Protein: 6.1 g/dL — ABNORMAL LOW (ref 6.5–8.1)

## 2020-11-15 LAB — CBC WITH DIFFERENTIAL/PLATELET
Abs Immature Granulocytes: 0.03 10*3/uL (ref 0.00–0.07)
Basophils Absolute: 0 10*3/uL (ref 0.0–0.1)
Basophils Relative: 0 %
Eosinophils Absolute: 0.2 10*3/uL (ref 0.0–0.5)
Eosinophils Relative: 2 %
HCT: 35.2 % — ABNORMAL LOW (ref 36.0–46.0)
Hemoglobin: 9.8 g/dL — ABNORMAL LOW (ref 12.0–15.0)
Immature Granulocytes: 0 %
Lymphocytes Relative: 7 %
Lymphs Abs: 0.6 10*3/uL — ABNORMAL LOW (ref 0.7–4.0)
MCH: 26.1 pg (ref 26.0–34.0)
MCHC: 27.8 g/dL — ABNORMAL LOW (ref 30.0–36.0)
MCV: 93.6 fL (ref 80.0–100.0)
Monocytes Absolute: 1 10*3/uL (ref 0.1–1.0)
Monocytes Relative: 11 %
Neutro Abs: 7.4 10*3/uL (ref 1.7–7.7)
Neutrophils Relative %: 80 %
Platelets: 145 10*3/uL — ABNORMAL LOW (ref 150–400)
RBC: 3.76 MIL/uL — ABNORMAL LOW (ref 3.87–5.11)
RDW: 17 % — ABNORMAL HIGH (ref 11.5–15.5)
WBC: 9.2 10*3/uL (ref 4.0–10.5)
nRBC: 0 % (ref 0.0–0.2)

## 2020-11-15 LAB — PROCALCITONIN: Procalcitonin: <0.1 ng/mL

## 2020-11-15 LAB — GLUCOSE, CAPILLARY
Glucose-Capillary: 122 mg/dL — ABNORMAL HIGH (ref 70–99)
Glucose-Capillary: 117 mg/dL — ABNORMAL HIGH (ref 70–99)
Glucose-Capillary: 239 mg/dL — ABNORMAL HIGH (ref 70–99)
Glucose-Capillary: 186 mg/dL — ABNORMAL HIGH (ref 70–99)

## 2020-11-15 LAB — URINE CULTURE
Culture: >=100000 — AB
Special Requests: NORMAL

## 2020-11-15 LAB — MAGNESIUM: Magnesium: 1.7 mg/dL (ref 1.7–2.4)

## 2020-11-15 LAB — PROTIME-INR
INR: 2.1 — ABNORMAL HIGH (ref 0.8–1.2)
Prothrombin Time: 23.7 seconds — ABNORMAL HIGH (ref 11.4–15.2)

## 2020-11-15 LAB — BRAIN NATRIURETIC PEPTIDE: B Natriuretic Peptide: 322.2 pg/mL — ABNORMAL HIGH (ref 0.0–100.0)

## 2020-11-15 MED ORDER — WARFARIN SODIUM 10 MG PO TABS
11.2500 mg | ORAL_TABLET | Freq: Once | ORAL | Status: AC
  Administered 2020-11-15: 11.25 mg via ORAL
  Filled 2020-11-15: qty 1

## 2020-11-15 MED ORDER — VANCOMYCIN HCL 1750 MG/350ML IV SOLN
1750.0000 mg | INTRAVENOUS | Status: DC
Start: 2020-11-16 — End: 2020-11-21
  Administered 2020-11-16 – 2020-11-19 (×5): 1750 mg via INTRAVENOUS
  Filled 2020-11-15 (×6): qty 350

## 2020-11-15 MED ORDER — PROSOURCE PLUS PO LIQD
30.0000 mL | Freq: Three times a day (TID) | ORAL | Status: DC
  Administered 2020-11-15 – 2020-11-20 (×16): 30 mL via ORAL
  Filled 2020-11-15 (×14): qty 30

## 2020-11-15 MED ORDER — ADULT MULTIVITAMIN W/MINERALS CH
1.0000 | ORAL_TABLET | Freq: Every day | ORAL | Status: DC
  Administered 2020-11-15 – 2020-11-20 (×6): 1 via ORAL
  Filled 2020-11-15 (×6): qty 1

## 2020-11-15 NOTE — Progress Notes (Signed)
Initial Nutrition Assessment  DOCUMENTATION CODES:   Obesity unspecified  INTERVENTION:   -MVI with minerals daily -30 ml Prosource Plus TID, each supplement provides 100 kcals and 15 grams protein -Magic cup TID with meals, each supplement provides 290 kcal and 9 grams of protein  -Feeding assistance with meals  NUTRITION DIAGNOSIS:   Increased nutrient needs related to wound healing as evidenced by estimated needs.  GOAL:   Patient will meet greater than or equal to 90% of their needs  MONITOR:   PO intake, Supplement acceptance, Labs, Weight trends, Skin, I & O's  REASON FOR ASSESSMENT:   Low Braden    ASSESSMENT:   Donna [name] is a 70 y.o. female with history of diastolic CHF last EF measured in 2019 was 55 to 60% with history of A. fib diabetes mellitus type 2, anemia, sleep apnea Parkinson's disease bedbound with sacral ulcers was brought to the ER after patient was found to have increasing peripheral edema with shortness of breath.  Patient also was found to be confused.  On my exam patient denies any chest pain did have some shortness of breath denies any abdominal pain or nausea vomiting.  Pt admitted with CHF and fever possibly secondary to UTI.   Reviewed I/O's: -1.4 L x 24 hours and -3.5 L since admission  UOP: 2.1 L x 24 hours  Per [facility] notes, pt with chronic, stage IV non-healing coccygeal pressure injury.   Pt unavailable at time of visit. Attempted to speak with pt via call to hospital room phone, however, unable to reach. RD unable to obtain further nutrition-related history or complete nutrition-focused physical exam at this time.    Pt is bedbound at baseline, with history of Parkinson's. Her appetite is good. Noted meal completion 75-100%. Pt may benefit from feeding assistance/ tray set up as well as oral nutrition supplements to help support wound healing.   Reviewed wt hx; wt has been stable over the past year.   Medications reviewed and include  sinemet, lasix, magnesium oxide, and vitamin B-12.   Lab Results  Component Value Date   HGBA1C 6.0 (H) 04/03/2019   PTA DM medications are 1.5 duaglutide weekly, 0-12 units insulin regular TID before meals, and 25 units insulin glargine daily at bedtime.   Labs reviewed: CBGS: 122-163 (inpatient orders for glycemic control are 0-9 units insulin aspart TID with meals and 25 units insulin glargine-yfgn daily at bedtime).    Diet Order:   Diet Order             Diet heart healthy/carb modified Room service appropriate? Yes; Fluid consistency: Thin; Fluid restriction: 1200 mL Fluid  Diet effective now                   EDUCATION NEEDS:   No education needs have been identified at this time  Skin:  Skin Assessment: Skin Integrity Issues: Skin Integrity Issues:: Stage II, Stage IV Stage II: rt hip Stage IV: non-healing coccygeal  Last BM:  Unknown  Height:   Ht Readings from Last 1 Encounters:  11/12/20 '5\' 10"'$  (1.778 m)    Weight:   Wt Readings from Last 1 Encounters:  11/14/20 114.3 kg    Ideal Body Weight:  72.7 kg  BMI:  Body mass index is 36.16 kg/m.  Estimated Nutritional Needs:   Kcal:  2200-2400  Protein:  130-145 grams  Fluid:  > 2 L    Loistine Chance, RD, LDN, [place] Registered Dietitian II Certified Diabetes  Care and Education Specialist Please refer to [facility] for RD and/or RD on-call/weekend/after hours pager

## 2020-11-15 NOTE — Progress Notes (Signed)
Pharmacy Antibiotic Note  MONALEE THACKERAY is a 70 y.o. female admitted on 11/12/2020 with bacteremia.  Pharmacy has been consulted for vancomycin dosing.  TYMESHIA JANVIER is a 70 y.o. female admitted from [facility] with AMS, increased peripheral edema. Found to have fever, MRSA bacteremia 1/4 bottles preliminarily, chronic sacral wound with malodorous drainage.    Plan: Adjust to Vancomycin 1750 mg IV q24h (eAUC 479, Scr 0.8) Goal AUC 400-550 Monitor renal function, clinical course, and micro data  Height: '5\' 10"'$  (177.8 cm) Weight: 114.3 kg (252 lb) IBW/kg (Calculated) : 68.5  Temp (24hrs), Avg:97.8 F (36.6 C), Min:97.3 F (36.3 C), Max:98.3 F (36.8 C)  Recent Labs  Lab 11/12/20 1433 11/12/20 1500 11/12/20 1945 11/13/20 0509 11/14/20 0133 11/15/20 0409  WBC  --   --  7.5 6.2 8.3 9.2  CREATININE 0.52  --   --  0.52 0.53 0.59  LATICACIDVEN  --  1.2  --   --   --   --      Estimated Creatinine Clearance: 90.9 mL/min (by C-G formula based on SCr of 0.59 mg/dL).    Allergies  Allergen Reactions   Bee Venom Hives    Takes benadryl   Other Other (See Comments)    Raw foods with seeds give her "boils".  Avoids raw strawberries, blueberries. Tolerates cooked fruits, tomato sauce, bread/grains with seeds. [facility] 10/17/13 Berries with seeds Allergy to glutamine-C-quercet-selen-brom per [facility] 09/25/17   Zosyn [Piperacillin Sod-Tazobactam So] Rash    Itchy total body rash.   Alteplase Rash   Cefepime Rash   Fentanyl Other (See Comments)    Hallucinations, syncope     Metformin And Related Diarrhea   Vancomycin Rash    Antimicrobials this admission: Daptomcyin 9/14 x1 dose Levofloxacin 9/12 >> 9/13  Dose adjustments this admission: Vancomycin 1750 mg IV Q24h  Microbiology results: 9/15 BCx: MRSA 9/12 UCx: Providencia stuartii   Thank you for allowing pharmacy to be a part of this patient's care.  Lestine Box, PharmD PGY2 Infectious Diseases Pharmacy Resident    Please check [email or web address] for unit-specific pharmacy phone numbers

## 2020-11-15 NOTE — Progress Notes (Signed)
PROGRESS NOTE                                                                                                                                                                                                             Patient Demographics:    Donna [name], is a 70 y.o. female, DOB - 05/21/1950, [id number]  Outpatient Primary MD for the patient is O'Buch, Alvis Lemmings, PA-C    LOS - 2  Admit date - 11/12/2020    Chief Complaint  Patient presents with   Altered Mental Status       Brief Narrative (HPI from H&P)   Donna [name] is a 70 y.o. female with history of diastolic CHF last EF measured in 2019 was 55 to 60% with history of A. fib diabetes mellitus type 2, anemia, sleep apnea Parkinson's disease bedbound with sacral ulcers was brought to the ER after patient was found to have increasing peripheral edema with shortness of breath, was dignosed with CHF and admitted to the hospital.   Subjective:   Patient in bed, appears comfortable, denies any headache, no fever, no chest pain or pressure, no shortness of breath , no abdominal pain. No new focal weakness, mildly confused.    Assessment  & Plan :     Acute Hypoxic Resp. Failure due to Acute on chronic diastolic CHF last EF [id number].  Agree with IV Lasix, dose increased, continue Aldactone and beta-blocker, continue supportive care with oxygen and monitor, TTE  stable, symptoms are better, Cards following.  Encouraged the patient to sit up in chair in the daytime use I-S and flutter valve for pulmonary toiletry.  Will advance activity and titrate down oxygen as possible.  SpO2: 93 % O2 Flow Rate (L/min): 3 L/min  2.  UTI.  Cultures noted, Rocephin >> Unasyn per ID .  3.  OSA.  CPAP at night.  4.  Chronic anemia and thrombocytopenia.  Monitor.  5.  Sacral decubitus ulcers present on admission.  Foley if needed for hygiene, wound care team consulted. 1/4 MRSA +ve BC, ID  following on Vanco, TTE -ve, TEE requested, Procal stable, defer management to ID, will likely need a PICC as well.  6.  Underlying Parkinson's.  On combination of Sinemet and amantadine. ? Underlying dementia.  7.  Hypertension.  Propranolol and ARB combination along with diuretics.  8.  Paroxysmal atrial fibrillation.  Mali vas 2 score of greater than 3.  On combination of propranolol and Coumadin.  Pharmacy monitoring INR.  9.  Obesity.  BMI 32.  Follow-up with PCP for weight loss.  10. Toxic Encephalopathy - likely hospital-acquired delirium along with multiple sedative medications, held Neurontin, Wellbutrin and Zoloft on 11/15/2020 Will monitor.  11. DM type II.  On Lantus  and sliding scale.  Will monitor and adjust.  Lab Results  Component Value Date   HGBA1C 6.0 (H) 04/03/2019    CBG (last 3)  Recent Labs    11/14/20 1530 11/14/20 2146 11/15/20 0849  GLUCAP 163* 131* 122*   Lab Results  Component Value Date   INR 2.1 (H) 11/15/2020   INR 1.8 (H) 11/14/2020   INR 1.7 (H) 11/13/2020     Recent Labs  Lab 11/12/20 1433 11/12/20 1500 11/12/20 1556 11/12/20 1945 11/13/20 0509 11/13/20 1600 11/14/20 0133 11/14/20 0452 11/15/20 0409  WBC  --   --   --  7.5 6.2  --  8.3  --  9.2  HGB  --   --  12.6 10.6* 10.5*  --  10.5*  --  9.8*  HCT  --   --  37.0 36.8 36.6  --  36.3  --  35.2*  PLT  --   --   --  125* 126*  --  140*  --  145*  BNP 412.3*  --   --   --   --   --  402.6*  --  322.2*  PROCALCITON  --   --   --   --   --  <0.10  --  <0.10 <0.10  AST 20  --   --   --   --   --  16  --  15  ALT 10  --   --   --   --   --  9  --  8  ALKPHOS 75  --   --   --   --   --  80  --  75  BILITOT 1.5*  --   --   --   --   --  1.3*  --  1.1  ALBUMIN 2.8*  --   --   --   --   --  2.8*  --  2.8*  INR 1.8*  --   --   --  1.7*  --  1.8*  --  2.1*  LATICACIDVEN  --  1.2  --   --   --   --   --   --   --   SARSCOV2NAA NEGATIVE  --   --   --   --   --   --   --   --             Condition - Fair  Family Communication  : Message left for patient's husband Broadus John [phone] on 10/20/2020 at 11:22 AM, called 11/14/20 at 9.45 am - no responce  Code Status :  Full  Consults  :  None  PUD Prophylaxis : PPI   Procedures  :     TEE  TTE - 1. Left ventricular ejection fraction, by estimation, is 60 to 65%. The left ventricle has normal function. The left ventricle has no regional wall motion abnormalities.  2. Right ventricular systolic function is mildly reduced. The right ventricular size is normal. There is severely elevated pulmonary artery systolic pressure.  3. Left atrial size was moderately dilated.  4. Right atrial size was severely dilated.  5. The mitral valve is degenerative. Trivial mitral valve regurgitation. No evidence of mitral stenosis.  6. Tricuspid valve regurgitation is moderate.  7. Compared with the echo 08/2017, aortic valve mean gradient has increased frm 9 mmHg to 17 mmHg. The aortic valve is normal in structure. There is moderate calcification of the aortic valve. There is moderate thickening of the aortic valve. Aortic valve regurgitation is not visualized. Mild aortic valve stenosis.  8. The inferior vena cava is dilated in size with <50% respiratory variability, suggesting right atrial pressure of 15 mmHg  CT Head - Non acute      Disposition Plan  :    Status is: Inpatient  Remains inpatient appropriate because:IV treatments appropriate due to intensity of illness or inability to take PO  Dispo: The patient is from: SNF              Anticipated d/c is to: SNF              Patient currently is not medically stable to d/c.   Difficult to place patient No  DVT Prophylaxis  :  Coumadin  Lab Results  Component Value Date   INR 2.1 (H) 11/15/2020   INR 1.8 (H) 11/14/2020   INR 1.7 (H) 11/13/2020    Lab Results  Component Value Date   PLT 145 (L) 11/15/2020    Diet :  Diet Order             Diet heart healthy/carb modified  Room service appropriate? Yes; Fluid consistency: Thin; Fluid restriction: 1200 mL Fluid  Diet effective now                    Inpatient Medications  Scheduled Meds:  amantadine  100 mg Oral Daily   carbamazepine  100 mg Oral BID   carbidopa-levodopa  1 tablet Oral TID WC   Chlorhexidine Gluconate Cloth  6 each Topical Daily   ezetimibe  10 mg Oral QHS   ferrous sulfate  325 mg Oral BID WC   furosemide  60 mg Intravenous BID   insulin aspart  0-9 Units Subcutaneous TID WC   insulin glargine-yfgn  25 Units Subcutaneous QHS   losartan  12.5 mg Oral Daily   magnesium oxide  400 mg Oral BID   pantoprazole  40 mg Oral Daily   pramipexole  0.125 mg Oral QHS   propranolol  20 mg Oral TID   spironolactone  50 mg Oral Daily   vitamin B-12  500 mcg Oral Daily   warfarin  11.25 mg Oral ONCE-1600   Warfarin - Pharmacist Dosing Inpatient   Does not apply q1600   Continuous Infusions:  ampicillin-sulbactam (UNASYN) IV 3 g (11/15/20 0520)   [START ON 11/16/2020] vancomycin      PRN Meds:.acetaminophen **OR** acetaminophen, ondansetron (ZOFRAN) IV  Antibiotics  :    Anti-infectives (From admission, onward)    Start     Dose/Rate Route Frequency Ordered Stop   11/16/20 0000  vancomycin (VANCOREADY) IVPB 1750 mg/350 mL        1,750 mg 175 mL/hr over 120 Minutes Intravenous Every 24 hours 11/15/20 0846     11/15/20 0500  DAPTOmycin (CUBICIN) 700 mg in sodium chloride 0.9 % IVPB  Status:  Discontinued        8 mg/kg  86.8 kg (Adjusted) 128 mL/hr over 30 Minutes Intravenous Daily 11/14/20 1013  11/14/20 1215   11/14/20 1315  vancomycin (VANCOREADY) IVPB 1250 mg/250 mL  Status:  Discontinued        1,250 mg 166.7 mL/hr over 90 Minutes Intravenous Every 12 hours 11/14/20 1215 11/15/20 0846   11/14/20 1115  Ampicillin-Sulbactam (UNASYN) 3 g in sodium chloride 0.9 % 100 mL IVPB        3 g 200 mL/hr over 30 Minutes Intravenous Every 6 hours 11/14/20 1025     11/14/20 1100  cefTRIAXone  (ROCEPHIN) 1 g in sodium chloride 0.9 % 100 mL IVPB  Status:  Discontinued        1 g 200 mL/hr over 30 Minutes Intravenous Every 24 hours 11/14/20 0958 11/14/20 1025   11/14/20 0230  DAPTOmycin (CUBICIN) 850 mg in sodium chloride 0.9 % IVPB        8 mg/kg  104 kg 134 mL/hr over 30 Minutes Intravenous  Once 11/14/20 0130 11/14/20 0501   11/13/20 1500  levofloxacin (LEVAQUIN) IVPB 750 mg  Status:  Discontinued        750 mg 100 mL/hr over 90 Minutes Intravenous Every 24 hours 11/12/20 2204 11/14/20 0951   11/12/20 1500  levofloxacin (LEVAQUIN) IVPB 750 mg        750 mg 100 mL/hr over 90 Minutes Intravenous  Once 11/12/20 1454 11/12/20 1704        Time Spent in minutes  30   Lala Lund M.D on 11/15/2020 at 8:55 AM  To page go to [email or web address]   [facility] -  Office  [phone]  See all Orders from today for further details    Objective:   Vitals:   11/14/20 0729 11/14/20 1232 11/14/20 2202 11/15/20 0519  BP: 129/70 123/66 (!) 99/52 101/61  Pulse: 91 81 82 87  Resp:    16  Temp: 98.1 F (36.7 C) 98.3 F (36.8 C) 97.7 F (36.5 C) (!) 97.3 F (36.3 C)  TempSrc: Oral Oral Oral Oral  SpO2: 100% 100% 99% 93%  Weight:      Height:        Wt Readings from Last 3 Encounters:  11/14/20 114.3 kg  08/18/19 114.8 kg  05/11/19 114.8 kg     Intake/Output Summary (Last 24 hours) at 11/15/2020 0855 Last data filed at 11/15/2020 0644 Gross per 24 hour  Intake 690 ml  Output 2100 ml  Net -1410 ml     Physical Exam  Awake mildly confused, woken up, moves all 4 extremities by self ,       [place].AT,PERRAL Supple Neck,No JVD, No cervical lymphadenopathy appriciated.  Symmetrical Chest wall movement, Good air movement bilaterally, CTAB RRR,No Gallops, Rubs or new Murmurs, No Parasternal Heave +ve B.Sounds, Abd Soft, No tenderness, No organomegaly appriciated, No rebound - guarding or rigidity. No Cyanosis, mild edema     RN pressure injury  documentation: Pressure Injury 11/12/20 Sacrum Mid Stage 4 - Full thickness tissue loss with exposed bone, tendon or muscle. red and pink bedding, tunneled. (Active)  11/12/20 1555  Location: Sacrum  Location Orientation: Mid  Staging: Stage 4 - Full thickness tissue loss with exposed bone, tendon or muscle.  Wound Description (Comments): red and pink bedding, tunneled.  Present on Admission: Yes     Pressure Injury Hip Right;Lateral Stage 2 -  Partial thickness loss of dermis presenting as a shallow open injury with a red, pink wound bed without slough. (Active)     Location: Hip  Location Orientation: Right;Lateral  Staging: Stage 2 -  Partial thickness loss of dermis presenting as a shallow open injury with a red, pink wound bed without slough.  Wound Description (Comments):   Present on Admission: -- (Not sure,)     Pressure Injury Hip Right;Lateral Stage 2 -  Partial thickness loss of dermis presenting as a shallow open injury with a red, pink wound bed without slough. (Active)     Location: Hip  Location Orientation: Right;Lateral  Staging: Stage 2 -  Partial thickness loss of dermis presenting as a shallow open injury with a red, pink wound bed without slough.  Wound Description (Comments):   Present on Admission: -- (Not sure, I noted it;s not documented)     Data Review:    CBC Recent Labs  Lab 11/12/20 1556 11/12/20 1945 11/13/20 0509 11/14/20 0133 11/15/20 0409  WBC  --  7.5 6.2 8.3 9.2  HGB 12.6 10.6* 10.5* 10.5* 9.8*  HCT 37.0 36.8 36.6 36.3 35.2*  PLT  --  125* 126* 140* 145*  MCV  --  91.5 91.5 91.0 93.6  MCH  --  26.4 26.3 26.3 26.1  MCHC  --  28.8* 28.7* 28.9* 27.8*  RDW  --  17.3* 17.4* 17.1* 17.0*  LYMPHSABS  --  0.8  --  0.7 0.6*  MONOABS  --  0.8  --  0.8 1.0  EOSABS  --  0.1  --  0.1 0.2  BASOSABS  --  0.0  --  0.0 0.0    Recent Labs  Lab 11/12/20 1433 11/12/20 1500 11/12/20 1556 11/13/20 0509 11/13/20 0709 11/13/20 1600 11/14/20 0133  11/14/20 0452 11/15/20 0409  NA 139  --  143 140  --   --  140  --  141  K 3.5  --  3.3* 3.6  --   --  3.7  --  3.6  CL 88*  --   --  93*  --   --  90*  --  91*  CO2 41*  --   --  40*  --   --  41*  --  42*  GLUCOSE 138*  --   --  143*  --   --  172*  --  145*  BUN 14  --   --  14  --   --  14  --  15  CREATININE 0.52  --   --  0.52  --   --  0.53  --  0.59  CALCIUM 8.3*  --   --  8.5*  --   --  8.8*  --  8.6*  AST 20  --   --   --   --   --  16  --  15  ALT 10  --   --   --   --   --  9  --  8  ALKPHOS 75  --   --   --   --   --  80  --  75  BILITOT 1.5*  --   --   --   --   --  1.3*  --  1.1  ALBUMIN 2.8*  --   --   --   --   --  2.8*  --  2.8*  MG 1.6*  --   --   --  1.9  --  1.7  --  1.7  PROCALCITON  --   --   --   --   --  <0.10  --  <0.10 <0.10  LATICACIDVEN  --  1.2  --   --   --   --   --   --   --   INR 1.8*  --   --  1.7*  --   --  1.8*  --  2.1*  BNP 412.3*  --   --   --   --   --  402.6*  --  322.2*    ------------------------------------------------------------------------------------------------------------------ No results for input(s): CHOL, HDL, LDLCALC, TRIG, CHOLHDL, LDLDIRECT in the last 72 hours.  Lab Results  Component Value Date   HGBA1C 6.0 (H) 04/03/2019   ------------------------------------------------------------------------------------------------------------------ No results for input(s): TSH, T4TOTAL, T3FREE, THYROIDAB in the last 72 hours.  Invalid input(s): FREET3  Cardiac Enzymes No results for input(s): CKMB, TROPONINI, MYOGLOBIN in the last 168 hours.  Invalid input(s): CK ------------------------------------------------------------------------------------------------------------------    Component Value Date/Time   BNP 322.2 (H) 11/15/2020 0409     Radiology Reports CT HEAD WO CONTRAST (5MM)  Result Date: 11/13/2020 CLINICAL DATA:  Altered mental status. EXAM: CT HEAD WITHOUT CONTRAST TECHNIQUE: Contiguous axial images were  obtained from the base of the skull through the vertex without intravenous contrast. COMPARISON:  Jul 19, 2020 FINDINGS: Brain: There is mild cerebral atrophy with widening of the extra-axial spaces and ventricular dilatation. There are areas of decreased attenuation within the white matter tracts of the supratentorial brain, consistent with microvascular disease changes. Vascular: No hyperdense vessel or unexpected calcification. Skull: Normal. Negative for fracture or focal lesion. Sinuses/Orbits: No acute finding. Other: None. IMPRESSION: 1. Generalized cerebral atrophy. 2. No acute intracranial abnormality. Electronically Signed   By: Virgina Norfolk M.D.   On: 11/13/2020 03:39   CT PELVIS W CONTRAST  Result Date: 11/14/2020 CLINICAL DATA:  Osteomyelitis. EXAM: CT PELVIS WITH CONTRAST TECHNIQUE: Multidetector CT imaging of the pelvis was performed using the standard protocol following the bolus administration of intravenous contrast. CONTRAST:  139m OMNIPAQUE IOHEXOL 350 MG/ML SOLN COMPARISON:  Jul 19, 2020.  April 06, 2019. FINDINGS: Urinary Tract: Urinary bladder is decompressed secondary to Foley catheter. Bowel:  Unremarkable visualized pelvic bowel loops. Vascular/Lymphatic: No pathologically enlarged lymph nodes. No significant vascular abnormality seen. Reproductive: Status post hysterectomy. No definite adnexal abnormality is noted. Other: No ascites is noted. 6.8 cm presacral mass is noted most consistent with sacral myelolipoma as noted on prior MRI. Decubitus ulceration is seen overlying the lower sacrum and coccyx. Musculoskeletal: No suspicious bone lesions identified. Status post right total hip arthroplasty. No lytic destruction is seen to suggest osteomyelitis. IMPRESSION: Decubitus ulcer is seen overlying the lower sacrum and coccyx, but no underlying osteomyelitis is noted. Stable 6.8 cm presacral mass is noted most consistent with sacral myelolipoma as noted on prior MRI.  Electronically Signed   By: JMarijo ConceptionM.D.   On: 11/14/2020 15:41   DG Chest Port 1 View  Result Date: 11/13/2020 CLINICAL DATA:  SOB (shortness of breath) R06.02 (ICD-10-CM) EXAM: PORTABLE CHEST 1 VIEW COMPARISON:  11/12/2020. FINDINGS: Similar mild enlargement cardiac silhouette. Similar pulmonary vascular congestion. Similar bibasilar opacities and probable small right pleural effusion. Mild interstitial prominence, possibly mild interstitial edema. No visible pneumothorax IMPRESSION: No substantial change from the prior. Similar cardiomegaly, pulmonary vascular congestion, probable small right pleural effusion and possible mild interstitial edema. Early pneumonia is not excluded at the lung bases with similar bibasilar opacities. Electronically Signed   By: FMargaretha SheffieldM.D.   On: 11/13/2020 12:12   DG Chest Port 1 View  Result Date: 11/12/2020 CLINICAL  DATA:  Questionable sepsis.  Evaluate for abnormality. EXAM: PORTABLE CHEST 1 VIEW COMPARISON:  Radiographs 04/06/2019 and 04/02/2019.  CT 07/19/2020. FINDINGS: 1556 hours. Right arm PICC has been removed. The heart remains enlarged. There is increased vascular congestion with patchy opacities at both lung bases and a probable small right pleural effusion. No evidence of pneumothorax. The bones appear unchanged. Telemetry leads overlie the chest. IMPRESSION: Cardiomegaly with vascular congestion, probable small right pleural effusion and probable mild edema. Cannot exclude early pneumonia at the lung bases. Radiographic follow up recommended. Electronically Signed   By: Richardean Sale M.D.   On: 11/12/2020 16:31   ECHOCARDIOGRAM LIMITED  Result Date: 11/14/2020    ECHOCARDIOGRAM LIMITED REPORT   Patient Name:   Donna [name] Date of Exam: 11/14/2020 Medical Rec #:  [id number]   Height:       70.0 in Accession #:    [id number]  Weight:       252.0 lb Date of Birth:  [birth year]  BSA:          2.303 m Patient Age:    75 years    BP:            123/66 mmHg Patient Gender: F           HR:           80 bpm. Exam Location:  Inpatient Procedure: Limited Echo, Color Doppler and Cardiac Doppler Indications:    [id number] Acute diastolic (congestive) heart failure  History:        Patient has prior history of Echocardiogram examinations, most                 recent 07/20/2020. CHF, Arrythmias:Atrial Fibrillation; Risk                 Factors:Hypertension, Diabetes and Sleep Apnea. Prior performed                 at [place]:    [place] Referring Phys: Arnell Asal Margaree Mackintosh [place]  1. Left ventricular ejection fraction, by estimation, is 60 to 65%. The left ventricle has normal function. The left ventricle has no regional wall motion abnormalities.  2. Right ventricular systolic function is mildly reduced. The right ventricular size is normal. There is severely elevated pulmonary artery systolic pressure.  3. Left atrial size was moderately dilated.  4. Right atrial size was severely dilated.  5. The mitral valve is degenerative. Trivial mitral valve regurgitation. No evidence of mitral stenosis.  6. Tricuspid valve regurgitation is moderate.  7. Compared with the echo 08/2017, aortic valve mean gradient has increased frm 9 mmHg to 17 mmHg. The aortic valve is normal in structure. There is moderate calcification of the aortic valve. There is moderate thickening of the aortic valve. Aortic valve regurgitation is not visualized. Mild aortic valve stenosis.  8. The inferior vena cava is dilated in size with <50% respiratory variability, suggesting right atrial pressure of 15 mmHg. FINDINGS  Left Ventricle: Left ventricular ejection fraction, by estimation, is 60 to 65%. The left ventricle has normal function. The left ventricle has no regional wall motion abnormalities. The left ventricular internal cavity size was normal in size. There is  no left ventricular hypertrophy. Right Ventricle: The right ventricular size is normal. No increase in  right ventricular wall thickness. Right ventricular systolic function is mildly reduced. There is severely elevated pulmonary artery systolic pressure. The tricuspid regurgitant velocity is 3.60 m/s, and with an  assumed right atrial pressure of 15 mmHg, the estimated right ventricular systolic pressure is [id number] mmHg. Left Atrium: Left atrial size was moderately dilated. Right Atrium: Right atrial size was severely dilated. Pericardium: There is no evidence of pericardial effusion. Mitral Valve: The mitral valve is degenerative in appearance. Mild mitral annular calcification. Trivial mitral valve regurgitation. No evidence of mitral valve stenosis. Tricuspid Valve: The tricuspid valve is normal in structure. Tricuspid valve regurgitation is moderate . No evidence of tricuspid stenosis. Aortic Valve: Compared with the echo 08/2017, aortic valve mean gradient has increased frm 9 mmHg to 17 mmHg. The aortic valve is normal in structure. There is moderate calcification of the aortic valve. There is moderate thickening of the aortic valve. Aortic valve regurgitation is not visualized. Mild aortic stenosis is present. Aortic valve mean gradient measures 17.0 mmHg. Aortic valve peak gradient measures 26.6 mmHg. Aortic valve area, by VTI measures 1.14 cm. Pulmonic Valve: The pulmonic valve was normal in structure. Pulmonic valve regurgitation is not visualized. No evidence of pulmonic stenosis. Aorta: The aortic root is normal in size and structure. Venous: The inferior vena cava is dilated in size with less than 50% respiratory variability, suggesting right atrial pressure of 15 mmHg. IAS/Shunts: No atrial level shunt detected by color flow Doppler. LEFT VENTRICLE PLAX 2D LVOT diam:     1.80 cm LV SV:         60 LV SV Index:   26 LVOT Area:     2.54 cm  AORTIC VALVE AV Area (Vmax):    1.15 cm AV Area (Vmean):   1.20 cm AV Area (VTI):     1.14 cm AV Vmax:           258.00 cm/s AV Vmean:          193.000 cm/s AV VTI:             0.521 m AV Peak Grad:      26.6 mmHg AV Mean Grad:      17.0 mmHg LVOT Vmax:         117.00 cm/s LVOT Vmean:        91.000 cm/s LVOT VTI:          0.234 m LVOT/AV VTI ratio: 0.45 TRICUSPID VALVE TR Peak grad:   51.8 mmHg TR Vmax:        360.00 cm/s  SHUNTS Systemic VTI:  0.23 m Systemic Diam: 1.80 cm Skeet Latch MD Electronically signed by Skeet Latch MD Signature Date/Time: 11/14/2020/5:37:02 PM    Final

## 2020-11-15 NOTE — Progress Notes (Signed)
[place] for Coumadin Indication: atrial fibrillation  Allergies  Allergen Reactions   Bee Venom Hives    Takes benadryl   Other Other (See Comments)    Raw foods with seeds give her "boils".  Avoids raw strawberries, blueberries. Tolerates cooked fruits, tomato sauce, bread/grains with seeds. [facility] 10/17/13 Berries with seeds Allergy to glutamine-C-quercet-selen-brom per [facility] 09/25/17   Zosyn [Piperacillin Sod-Tazobactam So] Rash    Itchy total body rash.   Alteplase Rash   Cefepime Rash   Fentanyl Other (See Comments)    Hallucinations, syncope     Metformin And Related Diarrhea   Vancomycin Rash    Patient Measurements: Height: '5\' 10"'$  (177.8 cm) Weight: 114.3 kg (252 lb) IBW/kg (Calculated) : 68.5  Vital Signs: Temp: 97.3 F (36.3 C) (09/15 0519) Temp Source: Oral (09/15 0519) BP: 101/61 (09/15 0519) Pulse Rate: 87 (09/15 0519)  Labs: Recent Labs    11/12/20 1433 11/12/20 1556 11/13/20 0509 11/14/20 0133 11/15/20 0409  HGB  --    < > 10.5* 10.5* 9.8*  HCT  --    < > 36.6 36.3 35.2*  PLT  --    < > 126* 140* 145*  APTT 31  --   --   --   --   LABPROT 20.7*  --  19.6* 21.1* 23.7*  INR 1.8*  --  1.7* 1.8* 2.1*  CREATININE 0.52  --  0.52 0.53 0.59   < > = values in this interval not displayed.     Estimated Creatinine Clearance: 90.9 mL/min (by C-G formula based on SCr of 0.59 mg/dL).  Assessment: 70 y.o. female admitted with CHF.  Pharmacy consulted to continue Coumadin for history of Afib.  Spoke to [facility] staff, patient's Coumadin dose was increased on 9/12 to '11mg'$  PO daily.    INR therapeutic at 2.1.  Hemoglobin and platelet count low; no bleeding reported.  Last dose of Levaquin 9/13.  Goal of Therapy:  INR 2-3 Monitor platelets by anticoagulation protocol: Yes   Plan:  Coumadin 11.'25mg'$  PO today Daily PT / INR  Teyah Rossy D. Mina Marble, PharmD, BCPS, [place] 11/15/2020, 7:56 AM

## 2020-11-15 NOTE — Progress Notes (Signed)
[facility] for Infectious Disease  Date of Admission:  11/12/2020      Total days of antibiotics 2   Vancomycin 11/14/2020 >>   Unasyn 9/14 >> current   ASSESSMENT: Donna [name] is a 70 y.o. female admitted from SNF for AMS, lower extremity edema.  Found to have worsening drainage and erythema from sacral wound with strong malodor and MRSA bacteremia, low grade in 1/4 bottles.  CT scan without any abscess identified. No bony changes. TTE with AOV thickening but no vegetations. Don't feel strongly we need to put her through TEE.  MRSA likely from infected sacral wound.  Once we clear bacteremia can place PICC line for treatment. Repeat cultures were drawn today. Total treatment will be 4 weeks (can switch to POs the last 2 weeks).   Can continue Unasyn for soft tissue infection of chronic sacral wound until she discharges --> finish out 10 days total gram negative/anaerobic  coverage with Augmentin.    Will see about dapto for use in nursing home for safer drug. She seems to be tolerating vancomycin at present.    PLAN: Continue vancomycin  Repeat blood cultures drawn today Hold on PICC until repeat blood cultures are negative x 48h Unasyn for now. Probably can change to augmentin to complete 10 days tomorrow No TEE necessary   Principal Problem:   MRSA bacteremia Active Problems:   Permanent atrial fibrillation ([place]): CHA2DS2-VASc Score 5; on Warfarin   OSA (obstructive sleep apnea)   Essential hypertension   Type 2 diabetes mellitus (HCC)   Decubitus ulcer of ischium, left, stage IV (HCC)   Acute on chronic diastolic CHF (congestive heart failure) (HCC)   Acute CHF (congestive heart failure) (HCC)    (feeding supplement) PROSource Plus  30 mL Oral TID BM   amantadine  100 mg Oral Daily   carbamazepine  100 mg Oral BID   carbidopa-levodopa  1 tablet Oral TID WC   Chlorhexidine Gluconate Cloth  6 each Topical Daily   ezetimibe  10 mg Oral QHS   ferrous  sulfate  325 mg Oral BID WC   furosemide  60 mg Intravenous BID   insulin aspart  0-9 Units Subcutaneous TID WC   insulin glargine-yfgn  25 Units Subcutaneous QHS   losartan  12.5 mg Oral Daily   magnesium oxide  400 mg Oral BID   multivitamin with minerals  1 tablet Oral Daily   pantoprazole  40 mg Oral Daily   pramipexole  0.125 mg Oral QHS   propranolol  20 mg Oral TID   spironolactone  50 mg Oral Daily   vitamin B-12  500 mcg Oral Daily   warfarin  11.25 mg Oral ONCE-1600   Warfarin - Pharmacist Dosing Inpatient   Does not apply [id number]    SUBJECTIVE: She says she feels better but her bottom hurts.    Review of Systems: Review of Systems  Constitutional:  Negative for chills, fever, malaise/fatigue and weight loss.  HENT:  Negative for sore throat.   Respiratory:  Negative for cough, sputum production and shortness of breath.   Cardiovascular: Negative.   Gastrointestinal:  Negative for abdominal pain, diarrhea and vomiting.  Musculoskeletal:  Positive for back pain (sacral). Negative for joint pain, myalgias and neck pain.  Skin:  Negative for rash.  Neurological:  Negative for headaches.  Psychiatric/Behavioral:  Negative for depression and substance abuse. The patient is not nervous/anxious.     Allergies  Allergen Reactions   Bee Venom Hives    Takes benadryl   Other Other (See Comments)    Raw foods with seeds give her "boils".  Avoids raw strawberries, blueberries. Tolerates cooked fruits, tomato sauce, bread/grains with seeds. [facility] 10/17/13 Berries with seeds Allergy to glutamine-C-quercet-selen-brom per [facility] 09/25/17   Zosyn [Piperacillin Sod-Tazobactam So] Rash    Itchy total body rash.   Alteplase Rash   Cefepime Rash   Fentanyl Other (See Comments)    Hallucinations, syncope     Metformin And Related Diarrhea   Vancomycin Rash    OBJECTIVE: Vitals:   11/14/20 2202 11/15/20 0519 11/15/20 0857 11/15/20 1006  BP: (!) 99/52 101/61 (!) 112/57 (!) 103/39   Pulse: 82 87 95 90  Resp:  '16 18 18  '$ Temp: 97.7 F (36.5 C) (!) 97.3 F (36.3 C) 98.4 F (36.9 C)   TempSrc: Oral Oral Oral   SpO2: 99% 93% 100%   Weight:      Height:       Body mass index is 36.16 kg/m.  Physical Exam Vitals reviewed.  Constitutional:      Comments: Chronically ill appearing elderly female. Undergoing physical therapy in bed.   HENT:     Mouth/Throat:     Mouth: Mucous membranes are moist.     Pharynx: Oropharynx is clear.  Eyes:     General: No scleral icterus.    Pupils: Pupils are equal, round, and reactive to light.  Cardiovascular:     Rate and Rhythm: Normal rate. Rhythm irregular.  Pulmonary:     Effort: Pulmonary effort is normal.     Breath sounds: Normal breath sounds.  Abdominal:     General: Bowel sounds are normal. There is no distension.     Palpations: Abdomen is soft.  Skin:    General: Skin is warm and dry.     Capillary Refill: Capillary refill takes less than 2 seconds.  Neurological:     Mental Status: She is alert.     Comments: Seems oriented to surroundings today.     Lab Results Lab Results  Component Value Date   WBC 9.2 11/15/2020   HGB 9.8 (L) 11/15/2020   HCT 35.2 (L) 11/15/2020   MCV 93.6 11/15/2020   PLT 145 (L) 11/15/2020    Lab Results  Component Value Date   CREATININE 0.59 11/15/2020   BUN 15 11/15/2020   NA 141 11/15/2020   K 3.6 11/15/2020   CL 91 (L) 11/15/2020   CO2 42 (H) 11/15/2020    Lab Results  Component Value Date   ALT 8 11/15/2020   AST 15 11/15/2020   ALKPHOS 75 11/15/2020   BILITOT 1.1 11/15/2020     Microbiology: Recent Results (from the past 240 hour(s))  Resp Panel by RT-PCR (Flu A&B, Covid) Nasopharyngeal Swab     Status: None   Collection Time: 11/12/20  2:33 PM   Specimen: Nasopharyngeal Swab; Nasopharyngeal(NP) swabs in vial transport medium  Result Value Ref Range Status   SARS Coronavirus 2 by RT PCR NEGATIVE NEGATIVE Final    Comment: (NOTE) SARS-CoV-2 target  nucleic acids are NOT DETECTED.  The SARS-CoV-2 RNA is generally detectable in upper respiratory specimens during the acute phase of infection. The lowest concentration of SARS-CoV-2 viral copies this assay can detect is 138 copies/mL. A negative result does not preclude SARS-Cov-2 infection and should not be used as the sole basis for treatment or other patient management decisions. A negative result may occur  with  improper specimen collection/handling, submission of specimen other than nasopharyngeal swab, presence of viral mutation(s) within the areas targeted by this assay, and inadequate number of viral copies(<138 copies/mL). A negative result must be combined with clinical observations, patient history, and epidemiological information. The expected result is Negative.  Fact Sheet for Patients:  EntrepreneurPulse.com.au  Fact Sheet for Healthcare Providers:  IncredibleEmployment.be  This test is no t yet approved or cleared by the [place] FDA and  has been authorized for detection and/or diagnosis of SARS-CoV-2 by FDA under an Emergency Use Authorization (EUA). This EUA will remain  in effect (meaning this test can be used) for the duration of the COVID-19 declaration under Section 564(b)(1) of the Act, 21 U.S.C.section 360bbb-3(b)(1), unless the authorization is terminated  or revoked sooner.       Influenza A by PCR NEGATIVE NEGATIVE Final   Influenza B by PCR NEGATIVE NEGATIVE Final    Comment: (NOTE) The Xpert Xpress SARS-CoV-2/FLU/RSV plus assay is intended as an aid in the diagnosis of influenza from Nasopharyngeal swab specimens and should not be used as a sole basis for treatment. Nasal washings and aspirates are unacceptable for Xpert Xpress SARS-CoV-2/FLU/RSV testing.  Fact Sheet for Patients: EntrepreneurPulse.com.au  Fact Sheet for Healthcare  Providers: IncredibleEmployment.be  This test is not yet approved or cleared by the [place] FDA and has been authorized for detection and/or diagnosis of SARS-CoV-2 by FDA under an Emergency Use Authorization (EUA). This EUA will remain in effect (meaning this test can be used) for the duration of the COVID-19 declaration under Section 564(b)(1) of the Act, 21 U.S.C. section 360bbb-3(b)(1), unless the authorization is terminated or revoked.  Performed at [facility], [place] [address], [place], [place] [address]   Blood Culture (routine x 2)     Status: Abnormal (Preliminary result)   Collection Time: 11/12/20  3:00 PM   Specimen: BLOOD  Result Value Ref Range Status   Specimen Description BLOOD LEFT ANTECUBITAL  Final   Special Requests   Final    BOTTLES DRAWN AEROBIC AND ANAEROBIC Blood Culture results may not be optimal due to an excessive volume of blood received in culture bottles   Culture  Setup Time   Final    GRAM POSITIVE COCCI IN CLUSTERS ANAEROBIC BOTTLE ONLY CRITICAL RESULT CALLED TO, READ BACK BY AND VERIFIED WITH: PHARMD GREG ABBOTT 11/14/2020 '@0021'$  BY JW    Culture (A)  Final    STAPHYLOCOCCUS AUREUS SUSCEPTIBILITIES TO FOLLOW Performed at [facility], [place] [address], [place], [place] [address]    Report Status PENDING  Incomplete  Urine Culture     Status: Abnormal   Collection Time: 11/12/20  3:00 PM   Specimen: In/Out Cath Urine  Result Value Ref Range Status   Specimen Description IN/OUT CATH URINE  Final   Special Requests Normal  Final   Culture (A)  Final    >=100,000 COLONIES/mL PROVIDENCIA STUARTII 60,000 COLONIES/mL AEROCOCCUS SPECIES Standardized susceptibility testing for this organism is not available. Performed at [facility], [place] [address] [place], [place] [address]    Report Status 11/15/2020 FINAL  Final   Organism ID, Bacteria PROVIDENCIA STUARTII (A)  Final      Susceptibility   Providencia  stuartii - MIC*    AMPICILLIN RESISTANT Resistant     CEFAZOLIN >=64 RESISTANT Resistant     CEFEPIME <=0.12 SENSITIVE Sensitive     CEFTRIAXONE <=0.25 SENSITIVE Sensitive     CIPROFLOXACIN >=  4 RESISTANT Resistant     GENTAMICIN RESISTANT Resistant     IMIPENEM 2 SENSITIVE Sensitive     NITROFURANTOIN 256 RESISTANT Resistant     TRIMETH/SULFA <=20 SENSITIVE Sensitive     AMPICILLIN/SULBACTAM 4 SENSITIVE Sensitive     PIP/TAZO <=4 SENSITIVE Sensitive     * >=100,000 COLONIES/mL PROVIDENCIA STUARTII  Blood Culture ID Panel (Reflexed)     Status: Abnormal   Collection Time: 11/12/20  3:00 PM  Result Value Ref Range Status   Enterococcus faecalis NOT DETECTED NOT DETECTED Final   Enterococcus Faecium NOT DETECTED NOT DETECTED Final   Listeria monocytogenes NOT DETECTED NOT DETECTED Final   Staphylococcus species DETECTED (A) NOT DETECTED Final    Comment: CRITICAL RESULT CALLED TO, READ BACK BY AND VERIFIED WITH: PHARMD GREG ABBOTT 11/14/2020 '@0021'$  BY JW    Staphylococcus aureus (BCID) DETECTED (A) NOT DETECTED Final    Comment: Methicillin (oxacillin)-resistant Staphylococcus aureus (MRSA). MRSA is predictably resistant to beta-lactam antibiotics (except ceftaroline). Preferred therapy is vancomycin unless clinically contraindicated. Patient requires contact precautions if  hospitalized. CRITICAL RESULT CALLED TO, READ BACK BY AND VERIFIED WITH: PHARMD GREG ABBOTT 11/14/2020 '@0021'$  BY JW    Staphylococcus epidermidis NOT DETECTED NOT DETECTED Final   Staphylococcus lugdunensis NOT DETECTED NOT DETECTED Final   Streptococcus species NOT DETECTED NOT DETECTED Final   Streptococcus agalactiae NOT DETECTED NOT DETECTED Final   Streptococcus pneumoniae NOT DETECTED NOT DETECTED Final   Streptococcus pyogenes NOT DETECTED NOT DETECTED Final   A.calcoaceticus-baumannii NOT DETECTED NOT DETECTED Final   Bacteroides fragilis NOT DETECTED NOT DETECTED Final   Enterobacterales NOT DETECTED NOT  DETECTED Final   Enterobacter cloacae complex NOT DETECTED NOT DETECTED Final   Escherichia coli NOT DETECTED NOT DETECTED Final   Klebsiella aerogenes NOT DETECTED NOT DETECTED Final   Klebsiella oxytoca NOT DETECTED NOT DETECTED Final   Klebsiella pneumoniae NOT DETECTED NOT DETECTED Final   Proteus species NOT DETECTED NOT DETECTED Final   Salmonella species NOT DETECTED NOT DETECTED Final   Serratia marcescens NOT DETECTED NOT DETECTED Final   Haemophilus influenzae NOT DETECTED NOT DETECTED Final   Neisseria meningitidis NOT DETECTED NOT DETECTED Final   Pseudomonas aeruginosa NOT DETECTED NOT DETECTED Final   Stenotrophomonas maltophilia NOT DETECTED NOT DETECTED Final   Candida albicans NOT DETECTED NOT DETECTED Final   Candida auris NOT DETECTED NOT DETECTED Final   Candida glabrata NOT DETECTED NOT DETECTED Final   Candida krusei NOT DETECTED NOT DETECTED Final   Candida parapsilosis NOT DETECTED NOT DETECTED Final   Candida tropicalis NOT DETECTED NOT DETECTED Final   Cryptococcus neoformans/gattii NOT DETECTED NOT DETECTED Final   Meth resistant mecA/C and MREJ DETECTED (A) NOT DETECTED Final    Comment: CRITICAL RESULT CALLED TO, READ BACK BY AND VERIFIED WITH: PHARMD GREG ABBOTT 11/14/2020 '@0021'$  BY JW Performed at [facility], [address] [place], [place] [address]   Blood Culture (routine x 2)     Status: None (Preliminary result)   Collection Time: 11/12/20  3:21 PM   Specimen: BLOOD RIGHT HAND  Result Value Ref Range Status   Specimen Description BLOOD RIGHT HAND  Final   Special Requests   Final    BOTTLES DRAWN AEROBIC AND ANAEROBIC Blood Culture results may not be optimal due to an inadequate volume of blood received in culture bottles   Culture   Final    NO GROWTH 3 DAYS Performed at [facility], [place]  [address] [place], [place] [address]    Report Status PENDING  Incomplete    Janene Madeira, MSN, NP-C [facility]ious  [facility] [facility] Pager: [phone]  '@TODAY'$ @ 1:08 PM

## 2020-11-15 NOTE — TOC Progression Note (Signed)
Transition of Care [facility]) - Progression Note    Patient Details  Name: Donna [name] MRN: [id number] Date of Birth: 1950/03/04  [facility] CM/SW Contact  Reece Agar, [place] Phone Number: 11/15/2020, 4:23 PM  Clinical Narrative:    CSW spoke with pt son about pt DC, pt son wants pt to transition to [place], [place] so that she is closer to family. PT son states that he does like [place] and if there is nothing available near [place] pt can return to [facility]. CSW will begin to search facilities in the [place] area.        Expected Discharge Plan and Services                                                 Social Determinants of Health (SDOH) Interventions    Readmission Risk Interventions No flowsheet data found.

## 2020-11-15 NOTE — Progress Notes (Signed)
Physical Therapy Treatment Patient Details Name: Donna [name] MRN: [id number] DOB: 1950-12-28 Today's Date: 11/15/2020   History of Present Illness Donna [name] is a 70 y.o. female admitted with increasing peripheral edema with shortness of breath and confusion. PMH:  diastolic CHF last EF measured in 2019 was 55 to 60% with history of A. fib, DMII, anemia, sleep apnea Parkinson's disease bedbound with sacral ulcers    PT Comments    Patient received in bed, alert. Follows direction. Performed bed exercises with assistance. Painful on L LE with movement. She is dependent with all mobility at this time. If patient is to get up out of bed she will require mechanical lift at this time. Patient will continue to benefit from skilled PT while here to improve strength and mobility for maximal independence.       Recommendations for follow up therapy are one component of a multi-disciplinary discharge planning process, led by the attending physician.  Recommendations may be updated based on patient status, additional functional criteria and insurance authorization.  Follow Up Recommendations  SNF;Supervision/Assistance - 24 hour     Equipment Recommendations  None recommended by PT    Recommendations for Other Services       Precautions / Restrictions Precautions Precautions: Fall Precaution Comments: many sacral ulcers Restrictions Weight Bearing Restrictions: No     Mobility  Bed Mobility Overal bed mobility: Needs Assistance Bed Mobility: Rolling Rolling: Total assist;+2 for physical assistance         General bed mobility comments: patient is very stiff with general movement of B LEs. Painful on left LE with movement. Reports it has been a "long time" since she has been on feet. Fearful of falling and reports falls at facility.    Transfers                 General transfer comment: not attempted  Ambulation/Gait             General Gait Details: unable,  non-ambulatory at baseline   Stairs             Wheelchair Mobility    Modified Rankin (Stroke Patients Only)       Balance                                            Cognition Arousal/Alertness: Awake/alert Behavior During Therapy: Flat affect Overall Cognitive Status: No family/caregiver present to determine baseline cognitive functioning                                 General Comments: Patient pleasant, not oriented to place, time.      Exercises Other Exercises Other Exercises: B LE exercises with assist: ap, heel slides, hip abd/add, SLR x 10 each. B UE exercises independent: shoulder flex, elbow flex, finger flex x 10 reps.    General Comments        Pertinent Vitals/Pain Pain Assessment: Faces Faces Pain Scale: Hurts little more Pain Location: generalized with mobility, groaning and moaning Pain Descriptors / Indicators: Discomfort;Moaning;Grimacing Pain Intervention(s): Monitored during session;Repositioned    Home Living                      Prior Function            PT Goals (  current goals can now be found in the care plan section) Acute Rehab PT Goals Patient Stated Goal: wants to go to a different facility PT Goal Formulation: With patient Time For Goal Achievement: 11/27/20 Potential to Achieve Goals: Fair Progress towards PT goals: Progressing toward goals    Frequency    Min 2X/week      PT Plan Current plan remains appropriate    Co-evaluation              AM-PAC PT "6 Clicks" Mobility   Outcome Measure  Help needed turning from your back to your side while in a flat bed without using bedrails?: Total Help needed moving from lying on your back to sitting on the side of a flat bed without using bedrails?: Total Help needed moving to and from a bed to a chair (including a wheelchair)?: Total Help needed standing up from a chair using your arms (e.g., wheelchair or bedside  chair)?: Total Help needed to walk in hospital room?: Total Help needed climbing 3-5 steps with a railing? : Total 6 Click Score: 6    End of Session Equipment Utilized During Treatment: Oxygen Activity Tolerance: Patient tolerated treatment well Patient left: in bed;with call bell/phone within reach Nurse Communication: Mobility status PT Visit Diagnosis: Other abnormalities of gait and mobility (R26.89);Muscle weakness (generalized) (M62.81)     Time: 1125-1140 PT Time Calculation (min) (ACUTE ONLY): 15 min  Charges:  $Therapeutic Exercise: 8-22 mins                     Zakaria Fromer, PT, GCS 11/15/20,11:53 AM

## 2020-11-15 NOTE — Progress Notes (Signed)
Patient son is at the bedside and RN was able to update the son on the plan of care. Patient's son Avalynne Komm would to speak to the MD. Patient's son phone number is [phone].

## 2020-11-15 NOTE — Progress Notes (Signed)
Pt refuses to wear CPAP.  °

## 2020-11-16 ENCOUNTER — Inpatient Hospital Stay (HOSPITAL_COMMUNITY): Payer: Medicare Other

## 2020-11-16 ENCOUNTER — Encounter (HOSPITAL_COMMUNITY)
Admission: EM | Disposition: A | Discharge status: Skilled Nursing Facility | Payer: Self-pay | Source: Home / Self Care | Attending: Internal Medicine

## 2020-11-16 DIAGNOSIS — L89324 Pressure ulcer of left buttock, stage 4: Secondary | ICD-10-CM | POA: Diagnosis not present

## 2020-11-16 DIAGNOSIS — B9562 Methicillin resistant Staphylococcus aureus infection as the cause of diseases classified elsewhere: Secondary | ICD-10-CM | POA: Diagnosis not present

## 2020-11-16 DIAGNOSIS — M25562 Pain in left knee: Secondary | ICD-10-CM | POA: Diagnosis not present

## 2020-11-16 DIAGNOSIS — I5033 Acute on chronic diastolic (congestive) heart failure: Secondary | ICD-10-CM | POA: Diagnosis not present

## 2020-11-16 DIAGNOSIS — R7881 Bacteremia: Secondary | ICD-10-CM | POA: Diagnosis not present

## 2020-11-16 LAB — COMPREHENSIVE METABOLIC PANEL
ALT: 9 U/L (ref 0–44)
AST: 16 U/L (ref 15–41)
Albumin: 2.8 g/dL — ABNORMAL LOW (ref 3.5–5.0)
Alkaline Phosphatase: 77 U/L (ref 38–126)
Anion gap: 11 (ref 5–15)
BUN: 20 mg/dL (ref 8–23)
CO2: 42 mmol/L — ABNORMAL HIGH (ref 22–32)
Calcium: 8.9 mg/dL (ref 8.9–10.3)
Chloride: 89 mmol/L — ABNORMAL LOW (ref 98–111)
Creatinine, Ser: 0.67 mg/dL (ref 0.44–1.00)
GFR, Estimated: >60 mL/min (ref >60)
Glucose, Bld: 178 mg/dL — ABNORMAL HIGH (ref 70–99)
Potassium: 3.3 mmol/L — ABNORMAL LOW (ref 3.5–5.1)
Sodium: 142 mmol/L (ref 135–145)
Total Bilirubin: 1.1 mg/dL (ref 0.3–1.2)
Total Protein: 6.2 g/dL — ABNORMAL LOW (ref 6.5–8.1)

## 2020-11-16 LAB — GLUCOSE, CAPILLARY
Glucose-Capillary: 147 mg/dL — ABNORMAL HIGH (ref 70–99)
Glucose-Capillary: 229 mg/dL — ABNORMAL HIGH (ref 70–99)
Glucose-Capillary: 188 mg/dL — ABNORMAL HIGH (ref 70–99)
Glucose-Capillary: 239 mg/dL — ABNORMAL HIGH (ref 70–99)

## 2020-11-16 LAB — CULTURE, BLOOD (ROUTINE X 2)

## 2020-11-16 LAB — MAGNESIUM: Magnesium: 1.7 mg/dL (ref 1.7–2.4)

## 2020-11-16 LAB — CBC WITH DIFFERENTIAL/PLATELET
Abs Immature Granulocytes: 0.03 10*3/uL (ref 0.00–0.07)
Basophils Absolute: 0 10*3/uL (ref 0.0–0.1)
Basophils Relative: 0 %
Eosinophils Absolute: 0.2 10*3/uL (ref 0.0–0.5)
Eosinophils Relative: 3 %
HCT: 35.4 % — ABNORMAL LOW (ref 36.0–46.0)
Hemoglobin: 9.9 g/dL — ABNORMAL LOW (ref 12.0–15.0)
Immature Granulocytes: 0 %
Lymphocytes Relative: 9 %
Lymphs Abs: 0.6 10*3/uL — ABNORMAL LOW (ref 0.7–4.0)
MCH: 26 pg (ref 26.0–34.0)
MCHC: 28 g/dL — ABNORMAL LOW (ref 30.0–36.0)
MCV: 92.9 fL (ref 80.0–100.0)
Monocytes Absolute: 0.6 10*3/uL (ref 0.1–1.0)
Monocytes Relative: 9 %
Neutro Abs: 5.3 10*3/uL (ref 1.7–7.7)
Neutrophils Relative %: 79 %
Platelets: 112 10*3/uL — ABNORMAL LOW (ref 150–400)
RBC: 3.81 MIL/uL — ABNORMAL LOW (ref 3.87–5.11)
RDW: 17.1 % — ABNORMAL HIGH (ref 11.5–15.5)
WBC: 6.8 10*3/uL (ref 4.0–10.5)
nRBC: 0 % (ref 0.0–0.2)

## 2020-11-16 LAB — PROTIME-INR
INR: 2.4 — ABNORMAL HIGH (ref 0.8–1.2)
Prothrombin Time: 26.3 seconds — ABNORMAL HIGH (ref 11.4–15.2)

## 2020-11-16 LAB — BRAIN NATRIURETIC PEPTIDE: B Natriuretic Peptide: 381.4 pg/mL — ABNORMAL HIGH (ref 0.0–100.0)

## 2020-11-16 SURGERY — ECHOCARDIOGRAM, TRANSESOPHAGEAL
Anesthesia: Moderate Sedation

## 2020-11-16 MED ORDER — WARFARIN SODIUM 5 MG PO TABS
11.0000 mg | ORAL_TABLET | Freq: Once | ORAL | Status: AC
  Administered 2020-11-16: 11 mg via ORAL
  Filled 2020-11-16: qty 2

## 2020-11-16 MED ORDER — POLYETHYLENE GLYCOL 3350 17 G PO PACK
17.0000 g | PACK | Freq: Every day | ORAL | Status: DC
  Administered 2020-11-16 – 2020-11-20 (×5): 17 g via ORAL
  Filled 2020-11-16 (×5): qty 1

## 2020-11-16 MED ORDER — ASCORBIC ACID 500 MG PO TABS
500.0000 mg | ORAL_TABLET | Freq: Every day | ORAL | Status: DC
  Administered 2020-11-16 – 2020-11-20 (×5): 500 mg via ORAL
  Filled 2020-11-16 (×5): qty 1

## 2020-11-16 MED ORDER — POTASSIUM CHLORIDE CRYS ER 20 MEQ PO TBCR
40.0000 meq | EXTENDED_RELEASE_TABLET | Freq: Once | ORAL | Status: AC
  Administered 2020-11-16: 40 meq via ORAL
  Filled 2020-11-16: qty 2

## 2020-11-16 MED ORDER — FUROSEMIDE 10 MG/ML IJ SOLN
40.0000 mg | Freq: Two times a day (BID) | INTRAMUSCULAR | Status: DC
  Administered 2020-11-16 – 2020-11-17 (×3): 40 mg via INTRAVENOUS
  Filled 2020-11-16 (×3): qty 4

## 2020-11-16 NOTE — Progress Notes (Signed)
[facility] for Coumadin Indication: atrial fibrillation  Allergies  Allergen Reactions   Bee Venom Hives    Takes benadryl   Other Other (See Comments)    Raw foods with seeds give her "boils".  Avoids raw strawberries, blueberries. Tolerates cooked fruits, tomato sauce, bread/grains with seeds. [facility] 10/17/13 Berries with seeds Allergy to glutamine-C-quercet-selen-brom per [facility] 09/25/17   Zosyn [Piperacillin Sod-Tazobactam So] Rash    Itchy total body rash.   Alteplase Rash   Cefepime Rash   Fentanyl Other (See Comments)    Hallucinations, syncope     Metformin And Related Diarrhea   Vancomycin Rash    Patient Measurements: Height: '5\' 10"'$  (177.8 cm) Weight: 113.8 kg (250 lb 14.1 oz) IBW/kg (Calculated) : 68.5  Vital Signs: Temp: 98.6 F (37 C) (09/16 0429) Temp Source: Oral (09/16 0429) BP: 115/71 (09/16 0429) Pulse Rate: 69 (09/16 0429)  Labs: Recent Labs    11/14/20 0133 11/15/20 0409 11/16/20 0249  HGB 10.5* 9.8* 9.9*  HCT 36.3 35.2* 35.4*  PLT 140* 145* 112*  LABPROT 21.1* 23.7* 26.3*  INR 1.8* 2.1* 2.4*  CREATININE 0.53 0.59 0.67     Estimated Creatinine Clearance: 90.7 mL/min (by C-G formula based on SCr of 0.67 mg/dL).  Assessment: 70 y.o. female admitted with CHF.  Pharmacy consulted to continue Coumadin for history of Afib.  Spoke to [facility] staff, patient's Coumadin dose was increased on 9/12 to '11mg'$  PO daily.    INR therapeutic at 2.4.  Hemoglobin and platelet count low; no bleeding reported.  Last dose of Levaquin 9/13.  Goal of Therapy:  INR 2-3 Monitor platelets by anticoagulation protocol: Yes   Plan:  Coumadin '11mg'$  PO today Daily PT / INR  Miliyah Luper D. Mina Marble, PharmD, BCPS, [place] 11/16/2020, 7:45 AM

## 2020-11-16 NOTE — Progress Notes (Signed)
PROGRESS NOTE                                                                                                                                                                                                             Patient Demographics:    Donna [name], is a 70 y.o. female, DOB - 05/27/1950, [id number]  Outpatient Primary MD for the patient is O'Buch, Alvis Lemmings, PA-C    LOS - 3  Admit date - 11/12/2020    Chief Complaint  Patient presents with   Altered Mental Status       Brief Narrative (HPI from H&P)   Donna [name] is a 70 y.o. female with history of diastolic CHF last EF measured in 2019 was 55 to 60% with history of A. fib diabetes mellitus type 2, anemia, sleep apnea Parkinson's disease bedbound with sacral ulcers was brought to the ER after patient was found to have increasing peripheral edema with shortness of breath, was dignosed with CHF and admitted to the hospital.   Subjective:   Patient in bed, appears comfortable, denies any headache, no fever, no chest pain or pressure, no shortness of breath , no abdominal pain. No new focal weakness.   Assessment  & Plan :     Acute Hypoxic Resp. Failure due to Acute on chronic diastolic CHF last EF [id number].  Agree with IV Lasix, dose increased, continue Aldactone and beta-blocker, continue supportive care with oxygen and monitor, TTE  stable, symptoms are better, Cards following.  Encouraged the patient to sit up in chair in the daytime use I-S and flutter valve for pulmonary toiletry.  Will advance activity and titrate down oxygen as possible.  SpO2: 100 % O2 Flow Rate (L/min): (P) 3 L/min  2.  UTI.  Cultures noted, Rocephin >> Unasyn per ID .  3.  OSA.  CPAP at night.  4.  Chronic anemia and thrombocytopenia.  Monitor.  5.  Sacral decubitus ulcers present on admission.  Foley if needed for hygiene, wound care team consulted. 1/4 MRSA +ve BC, ID following on Vanco,  TTE -ve, TEE requested, Procal stable, defer management to ID, will likely need a PICC as well.  6.  Underlying Parkinson's.  On combination of Sinemet and amantadine. ? Underlying dementia.  7.  Hypertension.  Propranolol and ARB combination along with diuretics.  8.  Paroxysmal atrial fibrillation.  Mali vas 2 score of greater than 3.  On combination of propranolol and Coumadin.  Pharmacy monitoring INR.  9.  Obesity.  BMI 32.  Follow-up with PCP for weight loss.  10. Toxic Encephalopathy - likely hospital-acquired delirium along with multiple sedative medications, held Neurontin, Wellbutrin and Zoloft on 11/15/2020 Will monitor.  11. DM type II.  On Lantus  and sliding scale.  Will monitor and adjust.  Lab Results  Component Value Date   HGBA1C 6.0 (H) 04/03/2019    CBG (last 3)  Recent Labs    11/15/20 1620 11/15/20 2114 11/16/20 0542  GLUCAP 239* 186* 147*   Lab Results  Component Value Date   INR 2.4 (H) 11/16/2020   INR 2.1 (H) 11/15/2020   INR 1.8 (H) 11/14/2020     Recent Labs  Lab 11/12/20 1433 11/12/20 1500 11/12/20 1556 11/12/20 1945 11/13/20 0509 11/13/20 1600 11/14/20 0133 11/14/20 0452 11/15/20 0409 11/16/20 0249  WBC  --   --   --  7.5 6.2  --  8.3  --  9.2 6.8  HGB  --   --    < > 10.6* 10.5*  --  10.5*  --  9.8* 9.9*  HCT  --   --    < > 36.8 36.6  --  36.3  --  35.2* 35.4*  PLT  --   --   --  125* 126*  --  140*  --  145* 112*  BNP 412.3*  --   --   --   --   --  402.6*  --  322.2* 381.4*  PROCALCITON  --   --   --   --   --  <0.10  --  <0.10 <0.10  --   AST 20  --   --   --   --   --  16  --  15 16  ALT 10  --   --   --   --   --  9  --  8 9  ALKPHOS 75  --   --   --   --   --  80  --  75 77  BILITOT 1.5*  --   --   --   --   --  1.3*  --  1.1 1.1  ALBUMIN 2.8*  --   --   --   --   --  2.8*  --  2.8* 2.8*  INR 1.8*  --   --   --  1.7*  --  1.8*  --  2.1* 2.4*  LATICACIDVEN  --  1.2  --   --   --   --   --   --   --   --   SARSCOV2NAA  NEGATIVE  --   --   --   --   --   --   --   --   --    < > = values in this interval not displayed.           Condition - Fair  Family Communication  :   Delilah Shan [phone] on 11/16/20  Code Status :  Full  Consults  :  None  PUD Prophylaxis : PPI   Procedures  :      TTE - 1. Left ventricular ejection fraction, by estimation, is 60 to 65%. The left ventricle has normal function. The left ventricle has no regional wall motion abnormalities.  2. Right ventricular systolic function is mildly reduced. The right ventricular size is normal. There is severely elevated pulmonary artery systolic pressure.  3. Left atrial size was moderately dilated.  4. Right atrial size was severely dilated.  5. The mitral valve is degenerative. Trivial mitral valve regurgitation. No evidence of mitral stenosis.  6. Tricuspid valve regurgitation is moderate.  7. Compared with the echo 08/2017, aortic valve mean gradient has increased frm 9 mmHg to 17 mmHg. The aortic valve is normal in structure. There is moderate calcification of the aortic valve. There is moderate thickening of the aortic valve. Aortic valve regurgitation is not visualized. Mild aortic valve stenosis.  8. The inferior vena cava is dilated in size with <50% respiratory variability, suggesting right atrial pressure of 15 mmHg  CT Head - Non acute      Disposition Plan  :    Status is: Inpatient  Remains inpatient appropriate because:IV treatments appropriate due to intensity of illness or inability to take PO  Dispo: The patient is from: SNF              Anticipated d/c is to: SNF              Patient currently is not medically stable to d/c.   Difficult to place patient No  DVT Prophylaxis  :  Coumadin  Lab Results  Component Value Date   INR 2.4 (H) 11/16/2020   INR 2.1 (H) 11/15/2020   INR 1.8 (H) 11/14/2020    Lab Results  Component Value Date   PLT 112 (L) 11/16/2020    Diet :  Diet Order             Diet  heart healthy/carb modified Room service appropriate? Yes; Fluid consistency: Thin; Fluid restriction: 1200 mL Fluid  Diet effective now                    Inpatient Medications  Scheduled Meds:  (feeding supplement) PROSource Plus  30 mL Oral TID BM   amantadine  100 mg Oral Daily   vitamin C  500 mg Oral Daily   carbamazepine  100 mg Oral BID   carbidopa-levodopa  1 tablet Oral TID WC   Chlorhexidine Gluconate Cloth  6 each Topical Daily   ferrous sulfate  325 mg Oral BID WC   furosemide  40 mg Intravenous BID   insulin aspart  0-9 Units Subcutaneous TID WC   insulin glargine-yfgn  25 Units Subcutaneous QHS   losartan  12.5 mg Oral Daily   magnesium oxide  400 mg Oral BID   multivitamin with minerals  1 tablet Oral Daily   pantoprazole  40 mg Oral Daily   polyethylene glycol  17 g Oral Daily   potassium chloride  40 mEq Oral Once   propranolol  20 mg Oral TID   spironolactone  50 mg Oral Daily   vitamin B-12  500 mcg Oral Daily   warfarin  11 mg Oral ONCE-1600   Warfarin - Pharmacist Dosing Inpatient   Does not apply q1600   Continuous Infusions:  ampicillin-sulbactam (UNASYN) IV 3 g (11/16/20 0531)   vancomycin 1,750 mg (11/16/20 0049)    PRN Meds:.acetaminophen **OR** acetaminophen, ondansetron (ZOFRAN) IV  Antibiotics  :    Anti-infectives (From admission, onward)    Start     Dose/Rate Route Frequency Ordered Stop   11/16/20 0000  vancomycin (VANCOREADY) IVPB 1750 mg/350 mL        1,750  mg 175 mL/hr over 120 Minutes Intravenous Every 24 hours 11/15/20 0846     11/15/20 0500  DAPTOmycin (CUBICIN) 700 mg in sodium chloride 0.9 % IVPB  Status:  Discontinued        8 mg/kg  86.8 kg (Adjusted) 128 mL/hr over 30 Minutes Intravenous Daily 11/14/20 1013 11/14/20 1215   11/14/20 1315  vancomycin (VANCOREADY) IVPB 1250 mg/250 mL  Status:  Discontinued        1,250 mg 166.7 mL/hr over 90 Minutes Intravenous Every 12 hours 11/14/20 1215 11/15/20 0846   11/14/20 1115   Ampicillin-Sulbactam (UNASYN) 3 g in sodium chloride 0.9 % 100 mL IVPB        3 g 200 mL/hr over 30 Minutes Intravenous Every 6 hours 11/14/20 1025     11/14/20 1100  cefTRIAXone (ROCEPHIN) 1 g in sodium chloride 0.9 % 100 mL IVPB  Status:  Discontinued        1 g 200 mL/hr over 30 Minutes Intravenous Every 24 hours 11/14/20 0958 11/14/20 1025   11/14/20 0230  DAPTOmycin (CUBICIN) 850 mg in sodium chloride 0.9 % IVPB        8 mg/kg  104 kg 134 mL/hr over 30 Minutes Intravenous  Once 11/14/20 0130 11/14/20 0501   11/13/20 1500  levofloxacin (LEVAQUIN) IVPB 750 mg  Status:  Discontinued        750 mg 100 mL/hr over 90 Minutes Intravenous Every 24 hours 11/12/20 2204 11/14/20 0951   11/12/20 1500  levofloxacin (LEVAQUIN) IVPB 750 mg        750 mg 100 mL/hr over 90 Minutes Intravenous  Once 11/12/20 1454 11/12/20 1704        Time Spent in minutes  30   Lala Lund M.D on 11/16/2020 at 9:33 AM  To page go to [email or web address]   [facility] -  Office  [phone]  See all Orders from today for further details    Objective:   Vitals:   11/15/20 1006 11/15/20 1700 11/15/20 2002 11/16/20 0429  BP: (!) 103/39 (!) 97/52 107/61 115/71  Pulse: 90 93 81 69  Resp: '18  19 18  '$ Temp:   98 F (36.7 C) 98.6 F (37 C)  TempSrc:   Oral Oral  SpO2:   98% 100%  Weight:    113.8 kg  Height:        Wt Readings from Last 3 Encounters:  11/16/20 113.8 kg  08/18/19 114.8 kg  05/11/19 114.8 kg     Intake/Output Summary (Last 24 hours) at 11/16/2020 0933 Last data filed at 11/16/2020 0542 Gross per 24 hour  Intake 1150 ml  Output 2200 ml  Net -1050 ml     Physical Exam  Awake and more Alert, No new F.N deficits,  Groom.AT,PERRAL Supple Neck,No JVD, No cervical lymphadenopathy appriciated.  Symmetrical Chest wall movement, Good air movement bilaterally, CTAB RRR,No Gallops, Rubs or new Murmurs, No Parasternal Heave +ve B.Sounds, Abd Soft, No tenderness, No organomegaly  appriciated, No rebound - guarding or rigidity. No Cyanosis, trace edema    RN pressure injury documentation: Pressure Injury 11/12/20 Sacrum Mid Stage 4 - Full thickness tissue loss with exposed bone, tendon or muscle. red and pink bedding, tunneled. (Active)  11/12/20 1555  Location: Sacrum  Location Orientation: Mid  Staging: Stage 4 - Full thickness tissue loss with exposed bone, tendon or muscle.  Wound Description (Comments): red and pink bedding, tunneled.  Present on Admission: Yes     Pressure Injury Hip Right;Lateral  Stage 2 -  Partial thickness loss of dermis presenting as a shallow open injury with a red, pink wound bed without slough. (Active)     Location: Hip  Location Orientation: Right;Lateral  Staging: Stage 2 -  Partial thickness loss of dermis presenting as a shallow open injury with a red, pink wound bed without slough.  Wound Description (Comments):   Present on Admission: -- (Not sure,)     Pressure Injury Hip Right;Lateral Stage 2 -  Partial thickness loss of dermis presenting as a shallow open injury with a red, pink wound bed without slough. (Active)     Location: Hip  Location Orientation: Right;Lateral  Staging: Stage 2 -  Partial thickness loss of dermis presenting as a shallow open injury with a red, pink wound bed without slough.  Wound Description (Comments):   Present on Admission: -- (Not sure, I noted it;s not documented)     Data Review:    CBC Recent Labs  Lab 11/12/20 1945 11/13/20 0509 11/14/20 0133 11/15/20 0409 11/16/20 0249  WBC 7.5 6.2 8.3 9.2 6.8  HGB 10.6* 10.5* 10.5* 9.8* 9.9*  HCT 36.8 36.6 36.3 35.2* 35.4*  PLT 125* 126* 140* 145* 112*  MCV 91.5 91.5 91.0 93.6 92.9  MCH 26.4 26.3 26.3 26.1 26.0  MCHC 28.8* 28.7* 28.9* 27.8* 28.0*  RDW 17.3* 17.4* 17.1* 17.0* 17.1*  LYMPHSABS 0.8  --  0.7 0.6* 0.6*  MONOABS 0.8  --  0.8 1.0 0.6  EOSABS 0.1  --  0.1 0.2 0.2  BASOSABS 0.0  --  0.0 0.0 0.0    Recent Labs  Lab  11/12/20 1433 11/12/20 1500 11/12/20 1556 11/13/20 0509 11/13/20 0709 11/13/20 1600 11/14/20 0133 11/14/20 0452 11/15/20 0409 11/16/20 0249  NA 139  --  143 140  --   --  140  --  141 142  K 3.5  --  3.3* 3.6  --   --  3.7  --  3.6 3.3*  CL 88*  --   --  93*  --   --  90*  --  91* 89*  CO2 41*  --   --  40*  --   --  41*  --  42* 42*  GLUCOSE 138*  --   --  143*  --   --  172*  --  145* 178*  BUN 14  --   --  14  --   --  14  --  15 20  CREATININE 0.52  --   --  0.52  --   --  0.53  --  0.59 0.67  CALCIUM 8.3*  --   --  8.5*  --   --  8.8*  --  8.6* 8.9  AST 20  --   --   --   --   --  16  --  15 16  ALT 10  --   --   --   --   --  9  --  8 9  ALKPHOS 75  --   --   --   --   --  80  --  75 77  BILITOT 1.5*  --   --   --   --   --  1.3*  --  1.1 1.1  ALBUMIN 2.8*  --   --   --   --   --  2.8*  --  2.8* 2.8*  MG 1.6*  --   --   --  1.9  --  1.7  --  1.7 1.7  PROCALCITON  --   --   --   --   --  <0.10  --  <0.10 <0.10  --   LATICACIDVEN  --  1.2  --   --   --   --   --   --   --   --   INR 1.8*  --   --  1.7*  --   --  1.8*  --  2.1* 2.4*  BNP 412.3*  --   --   --   --   --  402.6*  --  322.2* 381.4*    ------------------------------------------------------------------------------------------------------------------ No results for input(s): CHOL, HDL, LDLCALC, TRIG, CHOLHDL, LDLDIRECT in the last 72 hours.  Lab Results  Component Value Date   HGBA1C 6.0 (H) 04/03/2019   ------------------------------------------------------------------------------------------------------------------ No results for input(s): TSH, T4TOTAL, T3FREE, THYROIDAB in the last 72 hours.  Invalid input(s): FREET3  Cardiac Enzymes No results for input(s): CKMB, TROPONINI, MYOGLOBIN in the last 168 hours.  Invalid input(s): CK ------------------------------------------------------------------------------------------------------------------    Component Value Date/Time   BNP 381.4 (H) 11/16/2020 0249      Radiology Reports CT HEAD WO CONTRAST (5MM)  Result Date: 11/13/2020 CLINICAL DATA:  Altered mental status. EXAM: CT HEAD WITHOUT CONTRAST TECHNIQUE: Contiguous axial images were obtained from the base of the skull through the vertex without intravenous contrast. COMPARISON:  Jul 19, 2020 FINDINGS: Brain: There is mild cerebral atrophy with widening of the extra-axial spaces and ventricular dilatation. There are areas of decreased attenuation within the white matter tracts of the supratentorial brain, consistent with microvascular disease changes. Vascular: No hyperdense vessel or unexpected calcification. Skull: Normal. Negative for fracture or focal lesion. Sinuses/Orbits: No acute finding. Other: None. IMPRESSION: 1. Generalized cerebral atrophy. 2. No acute intracranial abnormality. Electronically Signed   By: Virgina Norfolk M.D.   On: 11/13/2020 03:39   CT PELVIS W CONTRAST  Result Date: 11/14/2020 CLINICAL DATA:  Osteomyelitis. EXAM: CT PELVIS WITH CONTRAST TECHNIQUE: Multidetector CT imaging of the pelvis was performed using the standard protocol following the bolus administration of intravenous contrast. CONTRAST:  134m OMNIPAQUE IOHEXOL 350 MG/ML SOLN COMPARISON:  Jul 19, 2020.  April 06, 2019. FINDINGS: Urinary Tract: Urinary bladder is decompressed secondary to Foley catheter. Bowel:  Unremarkable visualized pelvic bowel loops. Vascular/Lymphatic: No pathologically enlarged lymph nodes. No significant vascular abnormality seen. Reproductive: Status post hysterectomy. No definite adnexal abnormality is noted. Other: No ascites is noted. 6.8 cm presacral mass is noted most consistent with sacral myelolipoma as noted on prior MRI. Decubitus ulceration is seen overlying the lower sacrum and coccyx. Musculoskeletal: No suspicious bone lesions identified. Status post right total hip arthroplasty. No lytic destruction is seen to suggest osteomyelitis. IMPRESSION: Decubitus ulcer is seen  overlying the lower sacrum and coccyx, but no underlying osteomyelitis is noted. Stable 6.8 cm presacral mass is noted most consistent with sacral myelolipoma as noted on prior MRI. Electronically Signed   By: JMarijo ConceptionM.D.   On: 11/14/2020 15:41   DG Chest Port 1 View  Result Date: 11/13/2020 CLINICAL DATA:  SOB (shortness of breath) R06.02 (ICD-10-CM) EXAM: PORTABLE CHEST 1 VIEW COMPARISON:  11/12/2020. FINDINGS: Similar mild enlargement cardiac silhouette. Similar pulmonary vascular congestion. Similar bibasilar opacities and probable small right pleural effusion. Mild interstitial prominence, possibly mild interstitial edema. No visible pneumothorax IMPRESSION: No substantial change from the prior. Similar cardiomegaly, pulmonary vascular congestion, probable small right pleural effusion and possible  mild interstitial edema. Early pneumonia is not excluded at the lung bases with similar bibasilar opacities. Electronically Signed   By: Margaretha Sheffield M.D.   On: 11/13/2020 12:12   DG Chest Port 1 View  Result Date: 11/12/2020 CLINICAL DATA:  Questionable sepsis.  Evaluate for abnormality. EXAM: PORTABLE CHEST 1 VIEW COMPARISON:  Radiographs 04/06/2019 and 04/02/2019.  CT 07/19/2020. FINDINGS: 1556 hours. Right arm PICC has been removed. The heart remains enlarged. There is increased vascular congestion with patchy opacities at both lung bases and a probable small right pleural effusion. No evidence of pneumothorax. The bones appear unchanged. Telemetry leads overlie the chest. IMPRESSION: Cardiomegaly with vascular congestion, probable small right pleural effusion and probable mild edema. Cannot exclude early pneumonia at the lung bases. Radiographic follow up recommended. Electronically Signed   By: Richardean Sale M.D.   On: 11/12/2020 16:31   ECHOCARDIOGRAM LIMITED  Result Date: 11/14/2020    ECHOCARDIOGRAM LIMITED REPORT   Patient Name:   SHAUNI MCCORKELL Stangler Date of Exam: 11/14/2020 Medical Rec #:   [id number]   Height:       70.0 in Accession #:    [id number]  Weight:       252.0 lb Date of Birth:  21-Dec-1950  BSA:          2.303 m Patient Age:    57 years    BP:           123/66 mmHg Patient Gender: F           HR:           80 bpm. Exam Location:  Inpatient Procedure: Limited Echo, Color Doppler and Cardiac Doppler Indications:    [id number] Acute diastolic (congestive) heart failure  History:        Patient has prior history of Echocardiogram examinations, most                 recent 07/20/2020. CHF, Arrythmias:Atrial Fibrillation; Risk                 Factors:Hypertension, Diabetes and Sleep Apnea. Prior performed                 at [facility]:    [place] Referring Phys: Arnell Asal Margaree Mackintosh [place]  1. Left ventricular ejection fraction, by estimation, is 60 to 65%. The left ventricle has normal function. The left ventricle has no regional wall motion abnormalities.  2. Right ventricular systolic function is mildly reduced. The right ventricular size is normal. There is severely elevated pulmonary artery systolic pressure.  3. Left atrial size was moderately dilated.  4. Right atrial size was severely dilated.  5. The mitral valve is degenerative. Trivial mitral valve regurgitation. No evidence of mitral stenosis.  6. Tricuspid valve regurgitation is moderate.  7. Compared with the echo 08/2017, aortic valve mean gradient has increased frm 9 mmHg to 17 mmHg. The aortic valve is normal in structure. There is moderate calcification of the aortic valve. There is moderate thickening of the aortic valve. Aortic valve regurgitation is not visualized. Mild aortic valve stenosis.  8. The inferior vena cava is dilated in size with <50% respiratory variability, suggesting right atrial pressure of 15 mmHg. FINDINGS  Left Ventricle: Left ventricular ejection fraction, by estimation, is 60 to 65%. The left ventricle has normal function. The left ventricle has no regional wall motion abnormalities.  The left ventricular internal cavity size was normal in size. There is  no left  ventricular hypertrophy. Right Ventricle: The right ventricular size is normal. No increase in right ventricular wall thickness. Right ventricular systolic function is mildly reduced. There is severely elevated pulmonary artery systolic pressure. The tricuspid regurgitant velocity is 3.60 m/s, and with an assumed right atrial pressure of 15 mmHg, the estimated right ventricular systolic pressure is [id number] mmHg. Left Atrium: Left atrial size was moderately dilated. Right Atrium: Right atrial size was severely dilated. Pericardium: There is no evidence of pericardial effusion. Mitral Valve: The mitral valve is degenerative in appearance. Mild mitral annular calcification. Trivial mitral valve regurgitation. No evidence of mitral valve stenosis. Tricuspid Valve: The tricuspid valve is normal in structure. Tricuspid valve regurgitation is moderate . No evidence of tricuspid stenosis. Aortic Valve: Compared with the echo 08/2017, aortic valve mean gradient has increased frm 9 mmHg to 17 mmHg. The aortic valve is normal in structure. There is moderate calcification of the aortic valve. There is moderate thickening of the aortic valve. Aortic valve regurgitation is not visualized. Mild aortic stenosis is present. Aortic valve mean gradient measures 17.0 mmHg. Aortic valve peak gradient measures 26.6 mmHg. Aortic valve area, by VTI measures 1.14 cm. Pulmonic Valve: The pulmonic valve was normal in structure. Pulmonic valve regurgitation is not visualized. No evidence of pulmonic stenosis. Aorta: The aortic root is normal in size and structure. Venous: The inferior vena cava is dilated in size with less than 50% respiratory variability, suggesting right atrial pressure of 15 mmHg. IAS/Shunts: No atrial level shunt detected by color flow Doppler. LEFT VENTRICLE PLAX 2D LVOT diam:     1.80 cm LV SV:         60 LV SV Index:   26 LVOT Area:     2.54  cm  AORTIC VALVE AV Area (Vmax):    1.15 cm AV Area (Vmean):   1.20 cm AV Area (VTI):     1.14 cm AV Vmax:           258.00 cm/s AV Vmean:          193.000 cm/s AV VTI:            0.521 m AV Peak Grad:      26.6 mmHg AV Mean Grad:      17.0 mmHg LVOT Vmax:         117.00 cm/s LVOT Vmean:        91.000 cm/s LVOT VTI:          0.234 m LVOT/AV VTI ratio: 0.45 TRICUSPID VALVE TR Peak grad:   51.8 mmHg TR Vmax:        360.00 cm/s  SHUNTS Systemic VTI:  0.23 m Systemic Diam: 1.80 cm Skeet Latch MD Electronically signed by Skeet Latch MD Signature Date/Time: 11/14/2020/5:37:02 PM    Final

## 2020-11-16 NOTE — TOC Initial Note (Signed)
Transition of Care [facility]) - Initial/Assessment Note    Patient Details  Name: Donna [name] MRN: [id number] Date of Birth: 1950/06/10  Transition of Care [facility]) CM/SW Contact:    Tresa Endo Phone Number: 11/16/2020, 10:11 AM  Clinical Narrative:                 CSW received SNF consult. CSW met with pt son via phone. CSW introduced self and explained role at the hospital. Pt reports that PTA the pt was at [facility]. PT reports totalAx2, ADLs/Homemaking. PT reports pt is confused.  CSW reviewed PT/OT recommendations for SNF. Pt son reports wanting pt at a SNF. Pt gave CSW permission to fax out to facilities near [place]. Pt has no preference of facility at this time. Pt is COVID negative  CSW will continue to follow.    Expected Discharge Plan: Skilled Nursing Facility Barriers to Discharge: Continued Medical Work up   Patient Goals and CMS Choice Patient states their goals for this hospitalization and ongoing recovery are:: Rehab CMS Medicare.gov Compare Post Acute Care list provided to:: Patient Choice offered to / list presented to : Patient  Expected Discharge Plan and Services Expected Discharge Plan: [place] In-house Referral: Clinical Social Work   Post Acute Care Choice: [place] Living arrangements for the past 2 months: [place], [place]                                      Prior Living Arrangements/Services Living arrangements for the past 2 months: [place], [place] Lives with:: Facility Resident, Self Patient language and need for interpreter reviewed:: Yes Do you feel safe going back to the place where you live?: Yes      Need for Family Participation in Patient Care: Yes (Comment) Care giver support system in place?: Yes (comment)   Criminal Activity/Legal Involvement Pertinent to Current Situation/Hospitalization: No - Comment as  needed  Activities of Daily Living Home Assistive Devices/Equipment: None ADL Screening (condition at time of admission) Patient's cognitive ability adequate to safely complete daily activities?: No Is the patient deaf or have difficulty hearing?: No Does the patient have difficulty seeing, even when wearing glasses/contacts?: No Does the patient have difficulty concentrating, remembering, or making decisions?: Yes Patient able to express need for assistance with ADLs?: No Does the patient have difficulty dressing or bathing?: Yes Independently performs ADLs?: Yes (appropriate for developmental age) Does the patient have difficulty walking or climbing stairs?: Yes Weakness of Legs: Both Weakness of Arms/Hands: Both  Permission Sought/Granted Permission sought to share information with : Facility Sport and exercise psychologist, Family Supports Permission granted to share information with : Yes, Verbal Permission Granted  Share Information with NAME: OLUKEMI, PANCHAL (Son)   [phone]  Permission granted to share info w AGENCY: SNF  Permission granted to share info w Relationship: EVANNY, ELLERBE (Son)   [phone]  Permission granted to share info w Contact Information: ELEXUS, BARMAN (Son)   [phone]  Emotional Assessment Appearance:: Appears stated age Attitude/Demeanor/Rapport: Unable to Assess Affect (typically observed): Unable to Assess Orientation: : Oriented to Self Alcohol / Substance Use: Not Applicable Psych Involvement: No (comment)  Admission diagnosis:  Acute CHF (congestive heart failure) (HCC) [I50.9] Patient Active Problem List   Diagnosis Date Noted   MRSA bacteremia 11/14/2020   Acute on chronic diastolic CHF (congestive heart failure) ([place]) 11/13/2020   Acute  CHF (congestive heart failure) ([place]) 11/13/2020   Rash and nonspecific skin eruption    Hypersensitivity 04/09/2019   Sacral osteomyelitis ([place]) 04/08/2019   Allergic urticaria 04/08/2019   Orthostatic  hypotension    Decubitus ulcer of ischium, left, stage IV (HCC) 09/28/2017   Atrial fibrillation with RVR ([place]) 09/25/2017   Normocytic anemia 09/25/2017   Type 2 diabetes mellitus ([place]) 09/25/2017   Chronic pain 09/25/2017   History of seizures 09/03/2017   Depression with anxiety 02/09/2017   Venous stasis of both lower extremities 07/25/2016   Essential hypertension 07/25/2016   Anticoagulant long-term use 07/15/2016   Anatomical narrow angle borderline glaucoma of both eyes 04/02/2016   Chronic diastolic CHF (congestive heart failure) ([place]) 02/08/2016   Permanent atrial fibrillation ([place]): CHA2DS2-VASc Score 5; on Warfarin 02/08/2016   Morbid obesity ([place]) 02/08/2016   OSA (obstructive sleep apnea) 02/08/2016   Fibromyalgia 08/07/2012   Restless legs syndrome 08/07/2012   PCP:  Janine Limbo, PA-C Pharmacy:  No Pharmacies Listed    Social Determinants of Health (SDOH) Interventions    Readmission Risk Interventions No flowsheet data found.

## 2020-11-16 NOTE — Progress Notes (Signed)
Nutrition Follow-up  DOCUMENTATION CODES:   Obesity unspecified  INTERVENTION:   -Continue MVI with minerals daily -Continue 30 ml Prosource Plus TID, each supplement provides 100 kcals and 15 grams protein -Continue Magic cup TID with meals, each supplement provides 290 kcal and 9 grams of protein  -Continue feeding assistance with meals  NUTRITION DIAGNOSIS:   Increased nutrient needs related to wound healing as evidenced by estimated needs.  Ongoing  GOAL:   Patient will meet greater than or equal to 90% of their needs  Progressing   MONITOR:   PO intake, Supplement acceptance, Labs, Weight trends, Skin, I & O's  REASON FOR ASSESSMENT:   Low [place]    ASSESSMENT:   Donna [name] is a 70 y.o. female with history of diastolic CHF last EF measured in 2019 was 55 to 60% with history of A. fib diabetes mellitus type 2, anemia, sleep apnea Parkinson's disease bedbound with sacral ulcers was brought to the ER after patient was found to have increasing peripheral edema with shortness of breath.  Patient also was found to be confused.  On my exam patient denies any chest pain did have some shortness of breath denies any abdominal pain or nausea vomiting.  Reviewed I/O's: -1.1 L x 24 hours and -4.5 L since admission  UOP: 2.2 L x 24 hours  Spoke with pt at bedside, who was pleasant and in good spirits today. She reports feeling better and having a good appetite. Per pt, "they feed you too good in the hospital"; noted pt consumed about 75% of her breakfast tray. Documented meal completions 75-100%.   PTA, pt had a good appetite, consuming 3-4 times per day. Meal consist of eggs, grits, and toast or meat, starch, and vegetable.   Pt denies any weight loss.   Discussed importance of good meal and supplement intake to promote healing.   Medications reviewed and include vitamin C, sinemet, ferrous sulfate, lasix, miralax, and vitamin B-12.   Labs reviewed: CBGS: [id number]  (inpatient orders for glycemic control are 0-9 units insulin aspart TID with meals and 25 units insulin glargine daily at bedtime).     NUTRITION - FOCUSED PHYSICAL EXAM:  Flowsheet Row Most Recent Value  Orbital Region No depletion  Upper Arm Region No depletion  Thoracic and Lumbar Region No depletion  Buccal Region No depletion  Temple Region No depletion  Clavicle Bone Region No depletion  Clavicle and Acromion Bone Region No depletion  Scapular Bone Region No depletion  Dorsal Hand No depletion  Patellar Region No depletion  Anterior Thigh Region No depletion  Posterior Calf Region No depletion  Edema (RD Assessment) Moderate  Hair Reviewed  Eyes Reviewed  Mouth Reviewed  Skin Reviewed  Nails Reviewed       Diet Order:   Diet Order             Diet heart healthy/carb modified Room service appropriate? Yes; Fluid consistency: Thin; Fluid restriction: 1200 mL Fluid  Diet effective now                   EDUCATION NEEDS:   No education needs have been identified at this time  Skin:  Skin Assessment: Skin Integrity Issues: Skin Integrity Issues:: Stage II, Stage IV Stage II: rt hip Stage IV: non-healing coccygeal  Last BM:  Unknown  Height:   Ht Readings from Last 1 Encounters:  11/12/20 '5\' 10"'$  (1.778 m)    Weight:   Wt Readings from Last 1  Encounters:  11/16/20 113.8 kg    Ideal Body Weight:  72.7 kg  BMI:  Body mass index is 36 kg/m.  Estimated Nutritional Needs:   Kcal:  2200-2400  Protein:  130-145 grams  Fluid:  > 2 L    Loistine Chance, RD, LDN, [place] Registered Dietitian II Certified Diabetes Care and Education Specialist Please refer to [facility] for RD and/or RD on-call/weekend/after hours pager

## 2020-11-16 NOTE — Progress Notes (Signed)
Patient refuses to wear CPAP.

## 2020-11-16 NOTE — TOC Progression Note (Signed)
Transition of Care [facility]) - Progression Note    Patient Details  Name: Donna [name] MRN: [id number] Date of Birth: 1950/03/08  [facility] CM/SW Contact  Reece Agar, [place] Phone Number: 11/16/2020, 5:37 PM  Clinical Narrative:    CSW spoke with pt son who wanted pt closer to him at first then changed his mind bc he thinks pt care and follow up appointments would be best here in [place] for now. Per pt son pt will return to [place] when medically ready for DC. Pt son would like updates on pt for DC planning. CSW will continue to follow for DC planning needs.   Expected Discharge Plan: [place] Barriers to Discharge: Continued Medical Work up  Expected Discharge Plan and Services Expected Discharge Plan: [place] In-house Referral: Clinical Social Work   Post Acute Care Choice: [place] Living arrangements for the past 2 months: [place], Single Family Home                                       Social Determinants of Health (SDOH) Interventions    Readmission Risk Interventions No flowsheet data found.

## 2020-11-16 NOTE — Progress Notes (Signed)
[facility] Infectious Disease  Date of Admission:  11/12/2020      Total days of antibiotics 3   Vancomycin 11/14/2020 >>   Unasyn 9/14 >> current   ASSESSMENT: Donna [name] is a 70 y.o. female admitted from SNF for AMS, lower extremity edema.  Found to have worsening drainage and erythema from sacral wound with strong malodor and MRSA bacteremia, low grade in 1/4 bottles.  CT scan without any abscess identified. No bony changes. TTE with AOV thickening but no vegetations. Don't feel strongly we need to put her through TEE.  MRSA likely from infected sacral wound.  Once we clear bacteremia can place PICC line for treatment. Repeat cultures were drawn 9/15.   New Rt knee pain with erythema, warmth, swelling and decreased ROM. D/W Dr. Candiss Norse and will get MRI of the joint to assess for septic arthritis. Completeness will do XRay of Rt knee to eval hardware components.   Can continue Unasyn for soft tissue infection of chronic sacral wound until she discharges --> finish out 10 days total gram negative/anaerobic  coverage with Augmentin.  Day 5 today.   Will see about dapto for use in nursing home for safer drug. She seems to be tolerating vancomycin at present.    PLAN: Continue vancomycin  Follow repeat blood cultures  Hold on PICC until repeat blood cultures are negative x 48h Continue unasyn  MR of Lt knee w/ and w/o to eval SA No TEE necessary   Principal Problem:   MRSA bacteremia Active Problems:   Permanent atrial fibrillation ([place]): CHA2DS2-VASc Score 5; on Warfarin   OSA (obstructive sleep apnea)   Essential hypertension   Type 2 diabetes mellitus (HCC)   Decubitus ulcer of ischium, left, stage IV (HCC)   Acute on chronic diastolic CHF (congestive heart failure) (HCC)   Acute CHF (congestive heart failure) (HCC)    (feeding supplement) PROSource Plus  30 mL Oral TID BM   amantadine  100 mg Oral Daily   vitamin C  500 mg Oral Daily   carbamazepine   100 mg Oral BID   carbidopa-levodopa  1 tablet Oral TID WC   Chlorhexidine Gluconate Cloth  6 each Topical Daily   ferrous sulfate  325 mg Oral BID WC   furosemide  40 mg Intravenous BID   insulin aspart  0-9 Units Subcutaneous TID WC   insulin glargine-yfgn  25 Units Subcutaneous QHS   losartan  12.5 mg Oral Daily   magnesium oxide  400 mg Oral BID   multivitamin with minerals  1 tablet Oral Daily   pantoprazole  40 mg Oral Daily   polyethylene glycol  17 g Oral Daily   propranolol  20 mg Oral TID   spironolactone  50 mg Oral Daily   vitamin B-12  500 mcg Oral Daily   warfarin  11 mg Oral ONCE-1600   Warfarin - Pharmacist Dosing Inpatient   Does not apply q1600    SUBJECTIVE: She states she has increased left knee pain and difficulty moving it. Started last night. No problems with Rt knee. Generalized pain described as well.     Review of Systems: Review of Systems  Constitutional:  Negative for chills, fever, malaise/fatigue and weight loss.  HENT:  Negative for sore throat.   Respiratory:  Negative for cough, sputum production and shortness of breath.   Cardiovascular: Negative.   Gastrointestinal:  Negative for abdominal pain, diarrhea and vomiting.  Musculoskeletal:  Positive for back pain (sacral) and joint pain (Lt knee). Negative for myalgias and neck pain.  Skin:  Negative for rash.  Neurological:  Negative for headaches.  Psychiatric/Behavioral:  Negative for depression and substance abuse. The patient is not nervous/anxious.     Allergies  Allergen Reactions   Bee Venom Hives    Takes benadryl   Other Other (See Comments)    Raw foods with seeds give her "boils".  Avoids raw strawberries, blueberries. Tolerates cooked fruits, tomato sauce, bread/grains with seeds. [facility] 10/17/13 Berries with seeds Allergy to glutamine-C-quercet-selen-brom per [facility] 09/25/17   Zosyn [Piperacillin Sod-Tazobactam So] Rash    Itchy total body rash.   Alteplase Rash   Cefepime Rash    Fentanyl Other (See Comments)    Hallucinations, syncope     Metformin And Related Diarrhea   Vancomycin Rash    OBJECTIVE: Vitals:   11/15/20 1700 11/15/20 2002 11/16/20 0429 11/16/20 0944  BP: (!) 97/52 107/61 115/71 114/60  Pulse: 93 81 69 97  Resp:  '19 18 18  '$ Temp:  98 F (36.7 C) 98.6 F (37 C) 97.6 F (36.4 C)  TempSrc:  Oral Oral Oral  SpO2:  98% 100% 100%  Weight:   113.8 kg   Height:       Body mass index is 36 kg/m.  Physical Exam Vitals reviewed.  Constitutional:      Comments: Chronically ill appearing elderly female. Undergoing physical therapy in bed.   HENT:     Mouth/Throat:     Mouth: Mucous membranes are moist.     Pharynx: Oropharynx is clear.  Eyes:     General: No scleral icterus.    Pupils: Pupils are equal, round, and reactive to light.  Cardiovascular:     Rate and Rhythm: Normal rate. Rhythm irregular.  Pulmonary:     Effort: Pulmonary effort is normal.     Breath sounds: Normal breath sounds.  Abdominal:     General: Bowel sounds are normal. There is no distension.     Palpations: Abdomen is soft.  Musculoskeletal:     Left hip: No deformity, bony tenderness or crepitus.     Right knee: No swelling, effusion or erythema.     Left knee: Swelling, effusion and erythema present.     Comments: Well healed surgical scare overlying Rt knee Generalized pitting hip edema noted.  Slight erythema / warmth to Rt knee with limited ROM isolated to the knee joint it seems. Internal/external rotation of hip are not bothersome to her.   Skin:    General: Skin is warm and dry.     Capillary Refill: Capillary refill takes less than 2 seconds.  Neurological:     Mental Status: She is alert.     Comments: Seems oriented to surroundings today.     Lab Results Lab Results  Component Value Date   WBC 6.8 11/16/2020   HGB 9.9 (L) 11/16/2020   HCT 35.4 (L) 11/16/2020   MCV 92.9 11/16/2020   PLT 112 (L) 11/16/2020    Lab Results  Component Value  Date   CREATININE 0.67 11/16/2020   BUN 20 11/16/2020   NA 142 11/16/2020   K 3.3 (L) 11/16/2020   CL 89 (L) 11/16/2020   CO2 42 (H) 11/16/2020    Lab Results  Component Value Date   ALT 9 11/16/2020   AST 16 11/16/2020   ALKPHOS 77 11/16/2020   BILITOT 1.1 11/16/2020     Microbiology: Recent  Results (from the past 240 hour(s))  Resp Panel by RT-PCR (Flu A&B, Covid) Nasopharyngeal Swab     Status: None   Collection Time: 11/12/20  2:33 PM   Specimen: Nasopharyngeal Swab; Nasopharyngeal(NP) swabs in vial transport medium  Result Value Ref Range Status   SARS Coronavirus 2 by RT PCR NEGATIVE NEGATIVE Final    Comment: (NOTE) SARS-CoV-2 target nucleic acids are NOT DETECTED.  The SARS-CoV-2 RNA is generally detectable in upper respiratory specimens during the acute phase of infection. The lowest concentration of SARS-CoV-2 viral copies this assay can detect is 138 copies/mL. A negative result does not preclude SARS-Cov-2 infection and should not be used as the sole basis for treatment or other patient management decisions. A negative result may occur with  improper specimen collection/handling, submission of specimen other than nasopharyngeal swab, presence of viral mutation(s) within the areas targeted by this assay, and inadequate number of viral copies(<138 copies/mL). A negative result must be combined with clinical observations, patient history, and epidemiological information. The expected result is Negative.  Fact Sheet for Patients:  EntrepreneurPulse.com.au  Fact Sheet for Healthcare Providers:  IncredibleEmployment.be  This test is no t yet approved or cleared by the [place] FDA and  has been authorized for detection and/or diagnosis of SARS-CoV-2 by FDA under an Emergency Use Authorization (EUA). This EUA will remain  in effect (meaning this test can be used) for the duration of the COVID-19 declaration under Section  564(b)(1) of the Act, 21 U.S.C.section 360bbb-3(b)(1), unless the authorization is terminated  or revoked sooner.       Influenza A by PCR NEGATIVE NEGATIVE Final   Influenza B by PCR NEGATIVE NEGATIVE Final    Comment: (NOTE) The Xpert Xpress SARS-CoV-2/FLU/RSV plus assay is intended as an aid in the diagnosis of influenza from Nasopharyngeal swab specimens and should not be used as a sole basis for treatment. Nasal washings and aspirates are unacceptable for Xpert Xpress SARS-CoV-2/FLU/RSV testing.  Fact Sheet for Patients: EntrepreneurPulse.com.au  Fact Sheet for Healthcare Providers: IncredibleEmployment.be  This test is not yet approved or cleared by the [place] FDA and has been authorized for detection and/or diagnosis of SARS-CoV-2 by FDA under an Emergency Use Authorization (EUA). This EUA will remain in effect (meaning this test can be used) for the duration of the COVID-19 declaration under Section 564(b)(1) of the Act, 21 U.S.C. section 360bbb-3(b)(1), unless the authorization is terminated or revoked.  Performed at [facility], [place] [address], [place], [place] [address]   Blood Culture (routine x 2)     Status: Abnormal   Collection Time: 11/12/20  3:00 PM   Specimen: BLOOD  Result Value Ref Range Status   Specimen Description BLOOD LEFT ANTECUBITAL  Final   Special Requests   Final    BOTTLES DRAWN AEROBIC AND ANAEROBIC Blood Culture results may not be optimal due to an excessive volume of blood received in culture bottles   Culture  Setup Time   Final    GRAM POSITIVE COCCI IN CLUSTERS ANAEROBIC BOTTLE ONLY CRITICAL RESULT CALLED TO, READ BACK BY AND VERIFIED WITH: PHARMD GREG ABBOTT 11/14/2020 '@0021'$  BY JW Performed at [facility], [place] [address] [place], [place] [address]    Culture METHICILLIN RESISTANT STAPHYLOCOCCUS AUREUS (A)  Final   Report Status 11/16/2020 FINAL  Final   Organism ID, Bacteria  METHICILLIN RESISTANT STAPHYLOCOCCUS AUREUS  Final      Susceptibility   Methicillin resistant staphylococcus aureus - MIC*  CIPROFLOXACIN >=8 RESISTANT Resistant     ERYTHROMYCIN >=8 RESISTANT Resistant     GENTAMICIN <=0.5 SENSITIVE Sensitive     OXACILLIN >=4 RESISTANT Resistant     TETRACYCLINE <=1 SENSITIVE Sensitive     VANCOMYCIN 1 SENSITIVE Sensitive     TRIMETH/SULFA >=320 RESISTANT Resistant     CLINDAMYCIN <=0.25 SENSITIVE Sensitive     RIFAMPIN <=0.5 SENSITIVE Sensitive     Inducible Clindamycin NEGATIVE Sensitive     * METHICILLIN RESISTANT STAPHYLOCOCCUS AUREUS  Urine Culture     Status: Abnormal   Collection Time: 11/12/20  3:00 PM   Specimen: In/Out Cath Urine  Result Value Ref Range Status   Specimen Description IN/OUT CATH URINE  Final   Special Requests Normal  Final   Culture (A)  Final    >=100,000 COLONIES/mL PROVIDENCIA STUARTII 60,000 COLONIES/mL AEROCOCCUS SPECIES Standardized susceptibility testing for this organism is not available. Performed at [facility], [place] [address], [place], [place] [address]    Report Status 11/15/2020 FINAL  Final   Organism ID, Bacteria PROVIDENCIA STUARTII (A)  Final      Susceptibility   Providencia stuartii - MIC*    AMPICILLIN RESISTANT Resistant     CEFAZOLIN >=64 RESISTANT Resistant     CEFEPIME <=0.12 SENSITIVE Sensitive     CEFTRIAXONE <=0.25 SENSITIVE Sensitive     CIPROFLOXACIN >=4 RESISTANT Resistant     GENTAMICIN RESISTANT Resistant     IMIPENEM 2 SENSITIVE Sensitive     NITROFURANTOIN 256 RESISTANT Resistant     TRIMETH/SULFA <=20 SENSITIVE Sensitive     AMPICILLIN/SULBACTAM 4 SENSITIVE Sensitive     PIP/TAZO <=4 SENSITIVE Sensitive     * >=100,000 COLONIES/mL PROVIDENCIA STUARTII  Blood Culture ID Panel (Reflexed)     Status: Abnormal   Collection Time: 11/12/20  3:00 PM  Result Value Ref Range Status   Enterococcus faecalis NOT DETECTED NOT DETECTED Final   Enterococcus Faecium NOT DETECTED  NOT DETECTED Final   Listeria monocytogenes NOT DETECTED NOT DETECTED Final   Staphylococcus species DETECTED (A) NOT DETECTED Final    Comment: CRITICAL RESULT CALLED TO, READ BACK BY AND VERIFIED WITH: PHARMD GREG ABBOTT 11/14/2020 '@0021'$  BY JW    Staphylococcus aureus (BCID) DETECTED (A) NOT DETECTED Final    Comment: Methicillin (oxacillin)-resistant Staphylococcus aureus (MRSA). MRSA is predictably resistant to beta-lactam antibiotics (except ceftaroline). Preferred therapy is vancomycin unless clinically contraindicated. Patient requires contact precautions if  hospitalized. CRITICAL RESULT CALLED TO, READ BACK BY AND VERIFIED WITH: PHARMD GREG ABBOTT 11/14/2020 '@0021'$  BY JW    Staphylococcus epidermidis NOT DETECTED NOT DETECTED Final   Staphylococcus lugdunensis NOT DETECTED NOT DETECTED Final   Streptococcus species NOT DETECTED NOT DETECTED Final   Streptococcus agalactiae NOT DETECTED NOT DETECTED Final   Streptococcus pneumoniae NOT DETECTED NOT DETECTED Final   Streptococcus pyogenes NOT DETECTED NOT DETECTED Final   A.calcoaceticus-baumannii NOT DETECTED NOT DETECTED Final   Bacteroides fragilis NOT DETECTED NOT DETECTED Final   Enterobacterales NOT DETECTED NOT DETECTED Final   Enterobacter cloacae complex NOT DETECTED NOT DETECTED Final   Escherichia coli NOT DETECTED NOT DETECTED Final   Klebsiella aerogenes NOT DETECTED NOT DETECTED Final   Klebsiella oxytoca NOT DETECTED NOT DETECTED Final   Klebsiella pneumoniae NOT DETECTED NOT DETECTED Final   Proteus species NOT DETECTED NOT DETECTED Final   Salmonella species NOT DETECTED NOT DETECTED Final   Serratia marcescens NOT DETECTED NOT DETECTED Final   Haemophilus influenzae NOT DETECTED NOT DETECTED Final  Neisseria meningitidis NOT DETECTED NOT DETECTED Final   Pseudomonas aeruginosa NOT DETECTED NOT DETECTED Final   Stenotrophomonas maltophilia NOT DETECTED NOT DETECTED Final   Candida albicans NOT DETECTED NOT  DETECTED Final   Candida auris NOT DETECTED NOT DETECTED Final   Candida glabrata NOT DETECTED NOT DETECTED Final   Candida krusei NOT DETECTED NOT DETECTED Final   Candida parapsilosis NOT DETECTED NOT DETECTED Final   Candida tropicalis NOT DETECTED NOT DETECTED Final   Cryptococcus neoformans/gattii NOT DETECTED NOT DETECTED Final   Meth resistant mecA/C and MREJ DETECTED (A) NOT DETECTED Final    Comment: CRITICAL RESULT CALLED TO, READ BACK BY AND VERIFIED WITH: PHARMD GREG ABBOTT 11/14/2020 '@0021'$  BY JW Performed at [facility] [address] [place], [place] [address]   Blood Culture (routine x 2)     Status: None (Preliminary result)   Collection Time: 11/12/20  3:21 PM   Specimen: BLOOD RIGHT HAND  Result Value Ref Range Status   Specimen Description BLOOD RIGHT HAND  Final   Special Requests   Final    BOTTLES DRAWN AEROBIC AND ANAEROBIC Blood Culture results may not be optimal due to an inadequate volume of blood received in culture bottles   Culture   Final    NO GROWTH 4 DAYS Performed at [facility], [place] [address] [place], [place] [address]    Report Status PENDING  Incomplete  Culture, blood (routine x 2)     Status: None (Preliminary result)   Collection Time: 11/15/20  9:12 AM   Specimen: BLOOD RIGHT HAND  Result Value Ref Range Status   Specimen Description BLOOD RIGHT HAND  Final   Special Requests   Final    BOTTLES DRAWN AEROBIC AND ANAEROBIC Blood Culture adequate volume   Culture   Final    NO GROWTH < 24 HOURS Performed at [facility], [place] [address], [place], [place] [address]    Report Status PENDING  Incomplete  Culture, blood (routine x 2)     Status: None (Preliminary result)   Collection Time: 11/15/20  9:26 AM   Specimen: BLOOD LEFT HAND  Result Value Ref Range Status   Specimen Description BLOOD LEFT HAND  Final   Special Requests   Final    BOTTLES DRAWN AEROBIC AND ANAEROBIC Blood Culture adequate volume   Culture    Final    NO GROWTH < 24 HOURS Performed at [facility], [place] [address] [place], [place] [address]    Report Status PENDING  Incomplete    Janene Madeira, MSN, NP-[facility] Pager: [phone]  '@TODAY'$ @ 11:07 AM

## 2020-11-17 DIAGNOSIS — I5033 Acute on chronic diastolic (congestive) heart failure: Secondary | ICD-10-CM | POA: Diagnosis not present

## 2020-11-17 LAB — CBC WITH DIFFERENTIAL/PLATELET
Abs Immature Granulocytes: 0.04 10*3/uL (ref 0.00–0.07)
Basophils Absolute: 0 10*3/uL (ref 0.0–0.1)
Basophils Relative: 1 %
Eosinophils Absolute: 0.3 10*3/uL (ref 0.0–0.5)
Eosinophils Relative: 5 %
HCT: 34 % — ABNORMAL LOW (ref 36.0–46.0)
Hemoglobin: 9.7 g/dL — ABNORMAL LOW (ref 12.0–15.0)
Immature Granulocytes: 1 %
Lymphocytes Relative: 8 %
Lymphs Abs: 0.5 10*3/uL — ABNORMAL LOW (ref 0.7–4.0)
MCH: 25.8 pg — ABNORMAL LOW (ref 26.0–34.0)
MCHC: 28.5 g/dL — ABNORMAL LOW (ref 30.0–36.0)
MCV: 90.4 fL (ref 80.0–100.0)
Monocytes Absolute: 0.5 10*3/uL (ref 0.1–1.0)
Monocytes Relative: 8 %
Neutro Abs: 5.2 10*3/uL (ref 1.7–7.7)
Neutrophils Relative %: 77 %
Platelets: 103 10*3/uL — ABNORMAL LOW (ref 150–400)
RBC: 3.76 MIL/uL — ABNORMAL LOW (ref 3.87–5.11)
RDW: 16.9 % — ABNORMAL HIGH (ref 11.5–15.5)
WBC: 6.5 10*3/uL (ref 4.0–10.5)
nRBC: 0 % (ref 0.0–0.2)

## 2020-11-17 LAB — BRAIN NATRIURETIC PEPTIDE: B Natriuretic Peptide: 374.3 pg/mL — ABNORMAL HIGH (ref 0.0–100.0)

## 2020-11-17 LAB — GLUCOSE, CAPILLARY
Glucose-Capillary: 163 mg/dL — ABNORMAL HIGH (ref 70–99)
Glucose-Capillary: 221 mg/dL — ABNORMAL HIGH (ref 70–99)
Glucose-Capillary: 159 mg/dL — ABNORMAL HIGH (ref 70–99)
Glucose-Capillary: 164 mg/dL — ABNORMAL HIGH (ref 70–99)

## 2020-11-17 LAB — COMPREHENSIVE METABOLIC PANEL
ALT: 10 U/L (ref 0–44)
AST: 20 U/L (ref 15–41)
Albumin: 2.8 g/dL — ABNORMAL LOW (ref 3.5–5.0)
Alkaline Phosphatase: 80 U/L (ref 38–126)
Anion gap: 11 (ref 5–15)
BUN: 19 mg/dL (ref 8–23)
CO2: 38 mmol/L — ABNORMAL HIGH (ref 22–32)
Calcium: 8.6 mg/dL — ABNORMAL LOW (ref 8.9–10.3)
Chloride: 92 mmol/L — ABNORMAL LOW (ref 98–111)
Creatinine, Ser: 0.5 mg/dL (ref 0.44–1.00)
GFR, Estimated: >60 mL/min (ref >60)
Glucose, Bld: 162 mg/dL — ABNORMAL HIGH (ref 70–99)
Potassium: 3.5 mmol/L (ref 3.5–5.1)
Sodium: 141 mmol/L (ref 135–145)
Total Bilirubin: 1 mg/dL (ref 0.3–1.2)
Total Protein: 6.2 g/dL — ABNORMAL LOW (ref 6.5–8.1)

## 2020-11-17 LAB — CULTURE, BLOOD (ROUTINE X 2): Culture: NO GROWTH

## 2020-11-17 LAB — MAGNESIUM: Magnesium: 1.6 mg/dL — ABNORMAL LOW (ref 1.7–2.4)

## 2020-11-17 LAB — PROTIME-INR
INR: 3 — ABNORMAL HIGH (ref 0.8–1.2)
Prothrombin Time: 30.9 seconds — ABNORMAL HIGH (ref 11.4–15.2)

## 2020-11-17 MED ORDER — WARFARIN SODIUM 3 MG PO TABS: 6.0000 mg | ORAL_TABLET | Freq: Once | ORAL | Status: DC

## 2020-11-17 MED ORDER — MAGNESIUM OXIDE -MG SUPPLEMENT 400 (240 MG) MG PO TABS
400.0000 mg | ORAL_TABLET | Freq: Two times a day (BID) | ORAL | Status: DC
  Administered 2020-11-18 – 2020-11-20 (×6): 400 mg via ORAL
  Filled 2020-11-17 (×7): qty 1

## 2020-11-17 MED ORDER — POTASSIUM CHLORIDE CRYS ER 20 MEQ PO TBCR
40.0000 meq | EXTENDED_RELEASE_TABLET | Freq: Once | ORAL | Status: AC
  Administered 2020-11-17: 40 meq via ORAL
  Filled 2020-11-17: qty 2

## 2020-11-17 MED ORDER — POTASSIUM CHLORIDE CRYS ER 20 MEQ PO TBCR: 40.0000 meq | EXTENDED_RELEASE_TABLET | Freq: Once | ORAL | Status: DC

## 2020-11-17 MED ORDER — POTASSIUM CHLORIDE CRYS ER 20 MEQ PO TBCR
20.0000 meq | EXTENDED_RELEASE_TABLET | Freq: Once | ORAL | Status: AC
  Administered 2020-11-17: 20 meq via ORAL
  Filled 2020-11-17: qty 1

## 2020-11-17 MED ORDER — MAGNESIUM SULFATE 4 GM/100ML IV SOLN
4.0000 g | Freq: Once | INTRAVENOUS | Status: AC
  Administered 2020-11-17: 4 g via INTRAVENOUS
  Filled 2020-11-17: qty 100

## 2020-11-17 MED ORDER — WARFARIN SODIUM 7.5 MG PO TABS
7.5000 mg | ORAL_TABLET | Freq: Once | ORAL | Status: AC
  Administered 2020-11-17: 7.5 mg via ORAL
  Filled 2020-11-17: qty 1

## 2020-11-17 MED ORDER — FUROSEMIDE 10 MG/ML IJ SOLN
40.0000 mg | Freq: Every day | INTRAMUSCULAR | Status: DC
  Administered 2020-11-18 – 2020-11-20 (×3): 40 mg via INTRAVENOUS
  Filled 2020-11-17 (×3): qty 4

## 2020-11-17 NOTE — Progress Notes (Signed)
[facility] for Coumadin Indication: atrial fibrillation  Allergies  Allergen Reactions   Bee Venom Hives    Takes benadryl   Other Other (See Comments)    Raw foods with seeds give her "boils".  Avoids raw strawberries, blueberries. Tolerates cooked fruits, tomato sauce, bread/grains with seeds. [facility] 10/17/13 Berries with seeds Allergy to glutamine-C-quercet-selen-brom per [facility] 09/25/17   Zosyn [Piperacillin Sod-Tazobactam So] Rash    Itchy total body rash.   Alteplase Rash   Cefepime Rash   Fentanyl Other (See Comments)    Hallucinations, syncope     Metformin And Related Diarrhea   Vancomycin Rash    Patient Measurements: Height: '5\' 10"'$  (177.8 cm) Weight:  (Bed not calibrating weight, will zero it when PT gets her up) IBW/kg (Calculated) : 68.5  Vital Signs: Temp: 98 F (36.7 C) (09/17 0434) Temp Source: Oral (09/17 0434) BP: 109/62 (09/17 0748) Pulse Rate: 98 (09/17 0748)  Labs: Recent Labs    11/15/20 0409 11/16/20 0249 11/17/20 0452  HGB 9.8* 9.9* 9.7*  HCT 35.2* 35.4* 34.0*  PLT 145* 112* 103*  LABPROT 23.7* 26.3* 30.9*  INR 2.1* 2.4* 3.0*  CREATININE 0.59 0.67 0.50     Estimated Creatinine Clearance: 90.7 mL/min (by C-G formula based on SCr of 0.5 mg/dL).  Assessment: 70 y.o. female admitted with CHF.  Pharmacy consulted to continue Coumadin for history of Afib.  Spoke to [address] staff, patient's Coumadin dose was increased on 9/12 to '11mg'$  PO daily.    INR therapeutic at 3.0.  INR increased significantly since previous check. Will reduce today's dose. Patient remains on carbamazepine. Hemoglobin and platelet count low; no bleeding reported.  Last dose of Levaquin 9/13, likely not affecting INR.   Goal of Therapy:  INR 2-3 Monitor platelets by anticoagulation protocol: Yes   Plan:  Coumadin 7.5 mg PO today Daily PT / INR  Thank you for allowing pharmacy to participate in this patient's care.  Reatha Harps,  PharmD PGY1 Pharmacy Resident 11/17/2020 9:58 AM Check [email or web address] for unit specific pharmacy number

## 2020-11-17 NOTE — Progress Notes (Addendum)
PROGRESS NOTE                                                                                                                                                                                                             Patient Demographics:    Donna [name], is a 70 y.o. female, DOB - [birth year], [id number]  Outpatient Primary MD for the patient is O'Buch, Alvis Lemmings, PA-C    LOS - 4  Admit date - 11/12/2020    Chief Complaint  Patient presents with   Altered Mental Status       Brief Narrative (HPI from H&P)   Donna [name] is a 70 y.o. female with history of diastolic CHF last EF measured in 2019 was 55 to 60% with history of A. fib diabetes mellitus type 2, anemia, sleep apnea Parkinson's disease bedbound with sacral ulcers was brought to the ER after patient was found to have increasing peripheral edema with shortness of breath, was dignosed with CHF and admitted to the hospital.   Subjective:   Patient in bed, appears comfortable, denies any headache, no fever, no chest pain or pressure, no shortness of breath , no abdominal pain. No new focal weakness, she has chronic knee pain but states it is not worse than before.   Assessment  & Plan :     Acute Hypoxic Resp. Failure due to Acute on chronic diastolic CHF last EF [id number].  Agree with IV Lasix, dose increased, continue Aldactone and beta-blocker, continue supportive care with oxygen and monitor, TTE  stable, symptoms are better, Cards following.  Encouraged the patient to sit up in chair in the daytime use I-S and flutter valve for pulmonary toiletry.  Will advance activity and titrate down oxygen as possible.  SpO2: 98 % O2 Flow Rate (L/min): 3 L/min  2.  Sacral decubitus ulcers present on admission with 1 out of 4 MRSA bacteremia.  Foley if needed for hygiene, wound care team consulted. 1/4 MRSA +ve BC, ID following on Vanco, TTE -ve, TEE requested, Procal stable, defer  management to ID, will likely need a PICC as well.  Also some chronic bilateral knee pain, on exam no effusion or redness, no warmth, there is some tenderness in both knees but patient claims that is chronic, MRI noted and it was inconclusive.  ID following.   3.  OSA.  CPAP at night.  4.  Chronic anemia and thrombocytopenia.  Monitor.  5.  UTI.  Cultures noted, Rocephin >> Unasyn per ID .  6.  Underlying Parkinson's.  On combination of Sinemet and amantadine. ? Underlying dementia.  7.  Hypertension.  Propranolol and ARB combination along with diuretics.  8.  Paroxysmal atrial fibrillation.  Mali vas 2 score of greater than 3.  On combination of propranolol and Coumadin.  Pharmacy monitoring INR.  9.  Obesity.  BMI 32.  Follow-up with PCP for weight loss.  10. Toxic Encephalopathy - likely hospital-acquired delirium along with multiple sedative medications, held Neurontin, Wellbutrin and Zoloft on 11/15/2020, encephalopathy much improved.  Continue to monitor.  11.  Hypokalemia and hypomagnesemia.  Replaced.  12. 11/17/20 short NSVT on tele - asymptomatic, EF > 65%, on B.Blocker, Mag was low - replaced.  13. DM type II.  On Lantus  and sliding scale.  Will monitor and adjust.  Lab Results  Component Value Date   HGBA1C 6.0 (H) 04/03/2019    CBG (last 3)  Recent Labs    11/16/20 1641 11/16/20 2152 11/17/20 0616  GLUCAP 188* 239* 163*   Lab Results  Component Value Date   INR 3.0 (H) 11/17/2020   INR 2.4 (H) 11/16/2020   INR 2.1 (H) 11/15/2020     Recent Labs  Lab 11/12/20 1433 11/12/20 1500 11/12/20 1556 11/13/20 0509 11/13/20 1600 11/14/20 0133 11/14/20 0452 11/15/20 0409 11/16/20 0249 11/17/20 0452  WBC  --   --    < > 6.2  --  8.3  --  9.2 6.8 6.5  HGB  --   --    < > 10.5*  --  10.5*  --  9.8* 9.9* 9.7*  HCT  --   --    < > 36.6  --  36.3  --  35.2* 35.4* 34.0*  PLT  --   --    < > 126*  --  140*  --  145* 112* 103*  BNP 412.3*  --   --   --   --   402.6*  --  322.2* 381.4* 374.3*  PROCALCITON  --   --   --   --  <0.10  --  <0.10 <0.10  --   --   AST 20  --   --   --   --  16  --  '15 16 20  '$ ALT 10  --   --   --   --  9  --  '8 9 10  '$ ALKPHOS 75  --   --   --   --  80  --  75 77 80  BILITOT 1.5*  --   --   --   --  1.3*  --  1.1 1.1 1.0  ALBUMIN 2.8*  --   --   --   --  2.8*  --  2.8* 2.8* 2.8*  INR 1.8*  --   --  1.7*  --  1.8*  --  2.1* 2.4* 3.0*  LATICACIDVEN  --  1.2  --   --   --   --   --   --   --   --   SARSCOV2NAA NEGATIVE  --   --   --   --   --   --   --   --   --    < > = values in this interval not displayed.  Condition - Fair  Family Communication  :   Delilah Shan [phone] on 11/16/20, 11/17/20  Code Status :  Full  Consults  :  None  PUD Prophylaxis : PPI   Procedures  :      TTE - 1. Left ventricular ejection fraction, by estimation, is 60 to 65%. The left ventricle has normal function. The left ventricle has no regional wall motion abnormalities.  2. Right ventricular systolic function is mildly reduced. The right ventricular size is normal. There is severely elevated pulmonary artery systolic pressure.  3. Left atrial size was moderately dilated.  4. Right atrial size was severely dilated.  5. The mitral valve is degenerative. Trivial mitral valve regurgitation. No evidence of mitral stenosis.  6. Tricuspid valve regurgitation is moderate.  7. Compared with the echo 08/2017, aortic valve mean gradient has increased frm 9 mmHg to 17 mmHg. The aortic valve is normal in structure. There is moderate calcification of the aortic valve. There is moderate thickening of the aortic valve. Aortic valve regurgitation is not visualized. Mild aortic valve stenosis.  8. The inferior vena cava is dilated in size with <50% respiratory variability, suggesting right atrial pressure of 15 mmHg  CT Head - Non acute      Disposition Plan  :    Status is: Inpatient  Remains inpatient appropriate because:IV treatments  appropriate due to intensity of illness or inability to take PO  Dispo: The patient is from: SNF              Anticipated d/c is to: SNF              Patient currently is not medically stable to d/c.   Difficult to place patient No  DVT Prophylaxis  :  Coumadin  Lab Results  Component Value Date   INR 3.0 (H) 11/17/2020   INR 2.4 (H) 11/16/2020   INR 2.1 (H) 11/15/2020    Lab Results  Component Value Date   PLT 103 (L) 11/17/2020    Diet :  Diet Order             Diet heart healthy/carb modified Room service appropriate? Yes; Fluid consistency: Thin; Fluid restriction: 1200 mL Fluid  Diet effective now                    Inpatient Medications  Scheduled Meds:  (feeding supplement) PROSource Plus  30 mL Oral TID BM   amantadine  100 mg Oral Daily   vitamin C  500 mg Oral Daily   carbamazepine  100 mg Oral BID   carbidopa-levodopa  1 tablet Oral TID WC   Chlorhexidine Gluconate Cloth  6 each Topical Daily   ferrous sulfate  325 mg Oral BID WC   furosemide  40 mg Intravenous BID   insulin aspart  0-9 Units Subcutaneous TID WC   insulin glargine-yfgn  25 Units Subcutaneous QHS   losartan  12.5 mg Oral Daily   [START ON 11/18/2020] magnesium oxide  400 mg Oral BID   multivitamin with minerals  1 tablet Oral Daily   pantoprazole  40 mg Oral Daily   polyethylene glycol  17 g Oral Daily   propranolol  20 mg Oral TID   spironolactone  50 mg Oral Daily   vitamin B-12  500 mcg Oral Daily   warfarin  7.5 mg Oral ONCE-1600   Warfarin - Pharmacist Dosing Inpatient   Does not apply q1600   Continuous Infusions:  ampicillin-sulbactam (  UNASYN) IV 3 g (11/17/20 0451)   magnesium sulfate bolus IVPB     vancomycin 1,750 mg (11/17/20 0131)    PRN Meds:.acetaminophen **OR** acetaminophen, ondansetron (ZOFRAN) IV  Antibiotics  :    Anti-infectives (From admission, onward)    Start     Dose/Rate Route Frequency Ordered Stop   11/16/20 0000  vancomycin (VANCOREADY) IVPB  1750 mg/350 mL        1,750 mg 175 mL/hr over 120 Minutes Intravenous Every 24 hours 11/15/20 0846     11/15/20 0500  DAPTOmycin (CUBICIN) 700 mg in sodium chloride 0.9 % IVPB  Status:  Discontinued        8 mg/kg  86.8 kg (Adjusted) 128 mL/hr over 30 Minutes Intravenous Daily 11/14/20 1013 11/14/20 1215   11/14/20 1315  vancomycin (VANCOREADY) IVPB 1250 mg/250 mL  Status:  Discontinued        1,250 mg 166.7 mL/hr over 90 Minutes Intravenous Every 12 hours 11/14/20 1215 11/15/20 0846   11/14/20 1115  Ampicillin-Sulbactam (UNASYN) 3 g in sodium chloride 0.9 % 100 mL IVPB        3 g 200 mL/hr over 30 Minutes Intravenous Every 6 hours 11/14/20 1025     11/14/20 1100  cefTRIAXone (ROCEPHIN) 1 g in sodium chloride 0.9 % 100 mL IVPB  Status:  Discontinued        1 g 200 mL/hr over 30 Minutes Intravenous Every 24 hours 11/14/20 0958 11/14/20 1025   11/14/20 0230  DAPTOmycin (CUBICIN) 850 mg in sodium chloride 0.9 % IVPB        8 mg/kg  104 kg 134 mL/hr over 30 Minutes Intravenous  Once 11/14/20 0130 11/14/20 0501   11/13/20 1500  levofloxacin (LEVAQUIN) IVPB 750 mg  Status:  Discontinued        750 mg 100 mL/hr over 90 Minutes Intravenous Every 24 hours 11/12/20 2204 11/14/20 0951   11/12/20 1500  levofloxacin (LEVAQUIN) IVPB 750 mg        750 mg 100 mL/hr over 90 Minutes Intravenous  Once 11/12/20 1454 11/12/20 1704        Time Spent in minutes  30   Lala Lund M.D on 11/17/2020 at 10:17 AM  To page go to [email or web address]   [facility] -  Office  [phone]  See all Orders from today for further details    Objective:   Vitals:   11/16/20 2154 11/16/20 2204 11/17/20 0434 11/17/20 0748  BP: (!) 142/115 (!) 142/115 121/73 109/62  Pulse: 83 98 90 98  Resp:  18 20   Temp:  97.8 F (36.6 C) 98 F (36.7 C)   TempSrc:  Oral Oral   SpO2:  100% 96% 98%  Weight:      Height:        Wt Readings from Last 3 Encounters:  11/16/20 113.8 kg  08/18/19 114.8 kg  05/11/19  114.8 kg     Intake/Output Summary (Last 24 hours) at 11/17/2020 1017 Last data filed at 11/17/2020 0857 Gross per 24 hour  Intake 1350 ml  Output 2900 ml  Net -1550 ml     Physical Exam  Awake more Alert, No new F.N deficits,   Streeter.AT,PERRAL Supple Neck,No JVD, No cervical lymphadenopathy appriciated.  Symmetrical Chest wall movement, Good air movement bilaterally, CTAB RRR,No Gallops, Rubs or new Murmurs, No Parasternal Heave +ve B.Sounds, Abd Soft, No tenderness, No organomegaly appriciated, No rebound - guarding or rigidity. No Cyanosis, Clubbing or edema, No new Rash or  bruise     RN pressure injury documentation: Pressure Injury 11/12/20 Sacrum Mid Stage 4 - Full thickness tissue loss with exposed bone, tendon or muscle. red and pink bedding, tunneled. (Active)  11/12/20 1555  Location: Sacrum  Location Orientation: Mid  Staging: Stage 4 - Full thickness tissue loss with exposed bone, tendon or muscle.  Wound Description (Comments): red and pink bedding, tunneled.  Present on Admission: Yes     Pressure Injury Hip Right;Lateral Stage 2 -  Partial thickness loss of dermis presenting as a shallow open injury with a red, pink wound bed without slough. (Active)     Location: Hip  Location Orientation: Right;Lateral  Staging: Stage 2 -  Partial thickness loss of dermis presenting as a shallow open injury with a red, pink wound bed without slough.  Wound Description (Comments):   Present on Admission: -- (Not sure,)     Pressure Injury Hip Right;Lateral Stage 2 -  Partial thickness loss of dermis presenting as a shallow open injury with a red, pink wound bed without slough. (Active)     Location: Hip  Location Orientation: Right;Lateral  Staging: Stage 2 -  Partial thickness loss of dermis presenting as a shallow open injury with a red, pink wound bed without slough.  Wound Description (Comments):   Present on Admission: -- (Not sure, I noted it;s not documented)      Data Review:    CBC Recent Labs  Lab 11/12/20 1945 11/13/20 0509 11/14/20 0133 11/15/20 0409 11/16/20 0249 11/17/20 0452  WBC 7.5 6.2 8.3 9.2 6.8 6.5  HGB 10.6* 10.5* 10.5* 9.8* 9.9* 9.7*  HCT 36.8 36.6 36.3 35.2* 35.4* 34.0*  PLT 125* 126* 140* 145* 112* 103*  MCV 91.5 91.5 91.0 93.6 92.9 90.4  MCH 26.4 26.3 26.3 26.1 26.0 25.8*  MCHC 28.8* 28.7* 28.9* 27.8* 28.0* 28.5*  RDW 17.3* 17.4* 17.1* 17.0* 17.1* 16.9*  LYMPHSABS 0.8  --  0.7 0.6* 0.6* 0.5*  MONOABS 0.8  --  0.8 1.0 0.6 0.5  EOSABS 0.1  --  0.1 0.2 0.2 0.3  BASOSABS 0.0  --  0.0 0.0 0.0 0.0    Recent Labs  Lab 11/12/20 1433 11/12/20 1500 11/12/20 1556 11/13/20 0509 11/13/20 0709 11/13/20 1600 11/14/20 0133 11/14/20 0452 11/15/20 0409 11/16/20 0249 11/17/20 0452  NA 139  --    < > 140  --   --  140  --  141 142 141  K 3.5  --    < > 3.6  --   --  3.7  --  3.6 3.3* 3.5  CL 88*  --   --  93*  --   --  90*  --  91* 89* 92*  CO2 41*  --   --  40*  --   --  41*  --  42* 42* 38*  GLUCOSE 138*  --   --  143*  --   --  172*  --  145* 178* 162*  BUN 14  --   --  14  --   --  14  --  '15 20 19  '$ CREATININE 0.52  --   --  0.52  --   --  0.53  --  0.59 0.67 0.50  CALCIUM 8.3*  --   --  8.5*  --   --  8.8*  --  8.6* 8.9 8.6*  AST 20  --   --   --   --   --  16  --  $'15 16 20  'Y$ ALT 10  --   --   --   --   --  9  --  '8 9 10  '$ ALKPHOS 75  --   --   --   --   --  80  --  75 77 80  BILITOT 1.5*  --   --   --   --   --  1.3*  --  1.1 1.1 1.0  ALBUMIN 2.8*  --   --   --   --   --  2.8*  --  2.8* 2.8* 2.8*  MG 1.6*  --   --   --  1.9  --  1.7  --  1.7 1.7 1.6*  PROCALCITON  --   --   --   --   --  <0.10  --  <0.10 <0.10  --   --   LATICACIDVEN  --  1.2  --   --   --   --   --   --   --   --   --   INR 1.8*  --   --  1.7*  --   --  1.8*  --  2.1* 2.4* 3.0*  BNP 412.3*  --   --   --   --   --  402.6*  --  322.2* 381.4* 374.3*   < > = values in this interval not displayed.     ------------------------------------------------------------------------------------------------------------------ No results for input(s): CHOL, HDL, LDLCALC, TRIG, CHOLHDL, LDLDIRECT in the last 72 hours.  Lab Results  Component Value Date   HGBA1C 6.0 (H) 04/03/2019   ------------------------------------------------------------------------------------------------------------------ No results for input(s): TSH, T4TOTAL, T3FREE, THYROIDAB in the last 72 hours.  Invalid input(s): FREET3  Cardiac Enzymes No results for input(s): CKMB, TROPONINI, MYOGLOBIN in the last 168 hours.  Invalid input(s): CK ------------------------------------------------------------------------------------------------------------------    Component Value Date/Time   BNP 374.3 (H) 11/17/2020 0452     Radiology Reports DG Knee 1-2 Views Right  Result Date: 11/16/2020 CLINICAL DATA:  MRSA bacteremia. EXAM: RIGHT KNEE - 1-2 VIEW COMPARISON:  07/19/2020 FINDINGS: Similar appearance of the medial unicompartmental arthroplasty of the right knee compared to 07/19/2020. No abnormal lucency tracking along the hardware components is currently identified to indicate loosening or infection along the prosthesis at this time. There is substantial articular space narrowing in the patellofemoral joint with associated extensive spurring, along with mild marginal spurring in the lateral compartment. Stable lucency along the distal femoral metaphysis medially, potentially with mild surrounding sclerosis. This is not changed from 07/19/2020 but new compared to preoperative radiographs from 05/25/2014, significance uncertain. There is a trace knee effusion in the suprapatellar bursa. Equivocal thickening of the distal quadriceps tendon, along with some mild chronic heterotopic calcification along the distal quadriceps at the level of the distal femoral diaphysis. IMPRESSION: 1. Medial unicompartmental arthroplasty of the right  knee without lucency along the margins to suggest prosthesis loosening or infection. 2. Trace knee effusion. 3. Stable lucency along the distal femoral metaphysis medially potentially with mild surrounding sclerosis, no change from 07/19/2020. This could be some form of chronic postoperative reactive lesion but is technically nonspecific and was not appreciable on the preoperative radiograph of 05/25/2014. Chronic osteomyelitis is not entirely excluded although is not favored. 4. Suspected distal quadriceps tendinopathy. Electronically Signed   By: Van Clines M.D.   On: 11/16/2020 13:23   CT HEAD WO CONTRAST (5MM)  Result Date: 11/13/2020 CLINICAL DATA:  Altered mental status.  EXAM: CT HEAD WITHOUT CONTRAST TECHNIQUE: Contiguous axial images were obtained from the base of the skull through the vertex without intravenous contrast. COMPARISON:  Jul 19, 2020 FINDINGS: Brain: There is mild cerebral atrophy with widening of the extra-axial spaces and ventricular dilatation. There are areas of decreased attenuation within the white matter tracts of the supratentorial brain, consistent with microvascular disease changes. Vascular: No hyperdense vessel or unexpected calcification. Skull: Normal. Negative for fracture or focal lesion. Sinuses/Orbits: No acute finding. Other: None. IMPRESSION: 1. Generalized cerebral atrophy. 2. No acute intracranial abnormality. Electronically Signed   By: Virgina Norfolk M.D.   On: 11/13/2020 03:39   CT PELVIS W CONTRAST  Result Date: 11/14/2020 CLINICAL DATA:  Osteomyelitis. EXAM: CT PELVIS WITH CONTRAST TECHNIQUE: Multidetector CT imaging of the pelvis was performed using the standard protocol following the bolus administration of intravenous contrast. CONTRAST:  152m OMNIPAQUE IOHEXOL 350 MG/ML SOLN COMPARISON:  Jul 19, 2020.  April 06, 2019. FINDINGS: Urinary Tract: Urinary bladder is decompressed secondary to Foley catheter. Bowel:  Unremarkable visualized pelvic  bowel loops. Vascular/Lymphatic: No pathologically enlarged lymph nodes. No significant vascular abnormality seen. Reproductive: Status post hysterectomy. No definite adnexal abnormality is noted. Other: No ascites is noted. 6.8 cm presacral mass is noted most consistent with sacral myelolipoma as noted on prior MRI. Decubitus ulceration is seen overlying the lower sacrum and coccyx. Musculoskeletal: No suspicious bone lesions identified. Status post right total hip arthroplasty. No lytic destruction is seen to suggest osteomyelitis. IMPRESSION: Decubitus ulcer is seen overlying the lower sacrum and coccyx, but no underlying osteomyelitis is noted. Stable 6.8 cm presacral mass is noted most consistent with sacral myelolipoma as noted on prior MRI. Electronically Signed   By: JMarijo ConceptionM.D.   On: 11/14/2020 15:41   MR KNEE LEFT WO CONTRAST  Result Date: 11/16/2020 CLINICAL DATA:  MRSA bacteremia, drainage and erythema from sacral wound, concern for potential septic arthritis EXAM: MRI OF THE LEFT KNEE WITHOUT CONTRAST TECHNIQUE: Multiplanar, multisequence MR imaging of the knee was performed. No intravenous contrast was administered. Three severely motion degraded sequences were obtained and the patient refused all further imaging. COMPARISON:  09/03/2017 radiographs FINDINGS: 3 severely motion degraded sequences are provided, which substantially reduces diagnostic sensitivity and specificity. The patient refused other imaging. MENISCI Medial meniscus: Ill definition of the root of the posterior horn medial meniscus, cannot exclude radial tear. Lateral meniscus: Ill definition of the root of the posterior horn lateral meniscus, cannot exclude tear. LIGAMENTS Cruciates:  Grossly intact Collaterals:  Grossly intact CARTILAGE Patellofemoral: At least moderate chondral thinning with prominent spurring. Medial: Severe full-thickness articular cartilage loss. Prominent marginal spurring. Lateral:  Prominent  marginal spurring. Joint:  Small knee effusion.  Suspected mild synovitis. Popliteal Fossa: Low-grade infiltrative edema but not disproportionate to the generalized subcutaneous edema. Extensor Mechanism:  Grossly intact Bones: Suspected chronic mild avascular necrosis in the distal femoral metaphysis. Other: 2.3 by 1.3 by 3.4 cm (volume = 5.3 cm^3) proteinaceous or hemorrhagic fluid signal intensity structure along the superficial fascia margin of the left proximal calf superficial to the fibula on image 23 series 5. This has high T2 and high T1 signal characteristics and also marginal low signal which may represent marginal hemosiderin deposition. Accordingly a chronic hematoma is favored. IMPRESSION: 1. Today's exam consists of 3 severely motion degraded sequences. The patient refused completion of the exam and any further MR imaging. This substantially reduces diagnostic sensitivity and specificity. 2. There is a small knee effusion with  suspected mild synovitis. The effusion is less than I would typically expect in the setting of septic joint, and there is no overt marrow edema along the articular surfaces to indicate concomitant osteomyelitis. That said, early infection cannot be totally excluded based on the images provided. 3. Ill definition of the roots of the posterior horn medial meniscus and lateral meniscus, cannot exclude radial tears. 4. Severe osteoarthritis especially in the medial compartment. 5. Generalized infiltrative edema in the subcutaneous tissues and popliteal region. 6. About 5.3 cm hematoma along the superficial fascia margin lateral to the proximal fibia, with hemosiderin along the margins suggesting some chronicity. 7. Mild chronic AVN in the distal femoral metaphysis. Electronically Signed   By: Van Clines M.D.   On: 11/16/2020 13:15   DG Chest Port 1 View  Result Date: 11/13/2020 CLINICAL DATA:  SOB (shortness of breath) R06.02 (ICD-10-CM) EXAM: PORTABLE CHEST 1 VIEW  COMPARISON:  11/12/2020. FINDINGS: Similar mild enlargement cardiac silhouette. Similar pulmonary vascular congestion. Similar bibasilar opacities and probable small right pleural effusion. Mild interstitial prominence, possibly mild interstitial edema. No visible pneumothorax IMPRESSION: No substantial change from the prior. Similar cardiomegaly, pulmonary vascular congestion, probable small right pleural effusion and possible mild interstitial edema. Early pneumonia is not excluded at the lung bases with similar bibasilar opacities. Electronically Signed   By: Margaretha Sheffield M.D.   On: 11/13/2020 12:12   DG Chest Port 1 View  Result Date: 11/12/2020 CLINICAL DATA:  Questionable sepsis.  Evaluate for abnormality. EXAM: PORTABLE CHEST 1 VIEW COMPARISON:  Radiographs 04/06/2019 and 04/02/2019.  CT 07/19/2020. FINDINGS: 1556 hours. Right arm PICC has been removed. The heart remains enlarged. There is increased vascular congestion with patchy opacities at both lung bases and a probable small right pleural effusion. No evidence of pneumothorax. The bones appear unchanged. Telemetry leads overlie the chest. IMPRESSION: Cardiomegaly with vascular congestion, probable small right pleural effusion and probable mild edema. Cannot exclude early pneumonia at the lung bases. Radiographic follow up recommended. Electronically Signed   By: Richardean Sale M.D.   On: 11/12/2020 16:31   ECHOCARDIOGRAM LIMITED  Result Date: 11/14/2020    ECHOCARDIOGRAM LIMITED REPORT   Patient Name:   RIDDHIMA CATBAGAN [place] Date of Exam: 11/14/2020 Medical Rec #:  [id number]   Height:       70.0 in Accession #:    [id number]  Weight:       252.0 lb Date of Birth:  Jul 02, 1950  BSA:          2.303 m Patient Age:    35 years    BP:           123/66 mmHg Patient Gender: F           HR:           80 bpm. Exam Location:  Inpatient Procedure: Limited Echo, Color Doppler and Cardiac Doppler Indications:    [id number] Acute diastolic (congestive) heart failure   History:        Patient has prior history of Echocardiogram examinations, most                 recent 07/20/2020. CHF, Arrythmias:Atrial Fibrillation; Risk                 Factors:Hypertension, Diabetes and Sleep Apnea. Prior performed                 at [place]:    [place] Referring Phys: [id number] Margaree Mackintosh [place]  1. Left ventricular ejection fraction, by estimation, is 60 to 65%. The left ventricle has normal function. The left ventricle has no regional wall motion abnormalities.  2. Right ventricular systolic function is mildly reduced. The right ventricular size is normal. There is severely elevated pulmonary artery systolic pressure.  3. Left atrial size was moderately dilated.  4. Right atrial size was severely dilated.  5. The mitral valve is degenerative. Trivial mitral valve regurgitation. No evidence of mitral stenosis.  6. Tricuspid valve regurgitation is moderate.  7. Compared with the echo 08/2017, aortic valve mean gradient has increased frm 9 mmHg to 17 mmHg. The aortic valve is normal in structure. There is moderate calcification of the aortic valve. There is moderate thickening of the aortic valve. Aortic valve regurgitation is not visualized. Mild aortic valve stenosis.  8. The inferior vena cava is dilated in size with <50% respiratory variability, suggesting right atrial pressure of 15 mmHg. FINDINGS  Left Ventricle: Left ventricular ejection fraction, by estimation, is 60 to 65%. The left ventricle has normal function. The left ventricle has no regional wall motion abnormalities. The left ventricular internal cavity size was normal in size. There is  no left ventricular hypertrophy. Right Ventricle: The right ventricular size is normal. No increase in right ventricular wall thickness. Right ventricular systolic function is mildly reduced. There is severely elevated pulmonary artery systolic pressure. The tricuspid regurgitant velocity is 3.60 m/s, and with an  assumed right atrial pressure of 15 mmHg, the estimated right ventricular systolic pressure is [id number] mmHg. Left Atrium: Left atrial size was moderately dilated. Right Atrium: Right atrial size was severely dilated. Pericardium: There is no evidence of pericardial effusion. Mitral Valve: The mitral valve is degenerative in appearance. Mild mitral annular calcification. Trivial mitral valve regurgitation. No evidence of mitral valve stenosis. Tricuspid Valve: The tricuspid valve is normal in structure. Tricuspid valve regurgitation is moderate . No evidence of tricuspid stenosis. Aortic Valve: Compared with the echo 08/2017, aortic valve mean gradient has increased frm 9 mmHg to 17 mmHg. The aortic valve is normal in structure. There is moderate calcification of the aortic valve. There is moderate thickening of the aortic valve. Aortic valve regurgitation is not visualized. Mild aortic stenosis is present. Aortic valve mean gradient measures 17.0 mmHg. Aortic valve peak gradient measures 26.6 mmHg. Aortic valve area, by VTI measures 1.14 cm. Pulmonic Valve: The pulmonic valve was normal in structure. Pulmonic valve regurgitation is not visualized. No evidence of pulmonic stenosis. Aorta: The aortic root is normal in size and structure. Venous: The inferior vena cava is dilated in size with less than 50% respiratory variability, suggesting right atrial pressure of 15 mmHg. IAS/Shunts: No atrial level shunt detected by color flow Doppler. LEFT VENTRICLE PLAX 2D LVOT diam:     1.80 cm LV SV:         60 LV SV Index:   26 LVOT Area:     2.54 cm  AORTIC VALVE AV Area (Vmax):    1.15 cm AV Area (Vmean):   1.20 cm AV Area (VTI):     1.14 cm AV Vmax:           258.00 cm/s AV Vmean:          193.000 cm/s AV VTI:            0.521 m AV Peak Grad:      26.6 mmHg AV Mean Grad:      17.0 mmHg LVOT Vmax:  117.00 cm/s LVOT Vmean:        91.000 cm/s LVOT VTI:          0.234 m LVOT/AV VTI ratio: 0.45 TRICUSPID VALVE TR Peak  grad:   51.8 mmHg TR Vmax:        360.00 cm/s  SHUNTS Systemic VTI:  0.23 m Systemic Diam: 1.80 cm Skeet Latch MD Electronically signed by Skeet Latch MD Signature Date/Time: 11/14/2020/5:37:02 PM    Final

## 2020-11-18 ENCOUNTER — Inpatient Hospital Stay: Payer: Self-pay

## 2020-11-18 DIAGNOSIS — I5033 Acute on chronic diastolic (congestive) heart failure: Secondary | ICD-10-CM | POA: Diagnosis not present

## 2020-11-18 LAB — CBC WITH DIFFERENTIAL/PLATELET
Abs Immature Granulocytes: 0.03 10*3/uL (ref 0.00–0.07)
Basophils Absolute: 0 10*3/uL (ref 0.0–0.1)
Basophils Relative: 0 %
Eosinophils Absolute: 0.3 10*3/uL (ref 0.0–0.5)
Eosinophils Relative: 5 %
HCT: 35.2 % — ABNORMAL LOW (ref 36.0–46.0)
Hemoglobin: 10.1 g/dL — ABNORMAL LOW (ref 12.0–15.0)
Immature Granulocytes: 1 %
Lymphocytes Relative: 10 %
Lymphs Abs: 0.6 10*3/uL — ABNORMAL LOW (ref 0.7–4.0)
MCH: 26 pg (ref 26.0–34.0)
MCHC: 28.7 g/dL — ABNORMAL LOW (ref 30.0–36.0)
MCV: 90.5 fL (ref 80.0–100.0)
Monocytes Absolute: 0.6 10*3/uL (ref 0.1–1.0)
Monocytes Relative: 10 %
Neutro Abs: 4.7 10*3/uL (ref 1.7–7.7)
Neutrophils Relative %: 74 %
Platelets: 158 10*3/uL (ref 150–400)
RBC: 3.89 MIL/uL (ref 3.87–5.11)
RDW: 16.8 % — ABNORMAL HIGH (ref 11.5–15.5)
WBC: 6.3 10*3/uL (ref 4.0–10.5)
nRBC: 0.3 % — ABNORMAL HIGH (ref 0.0–0.2)

## 2020-11-18 LAB — COMPREHENSIVE METABOLIC PANEL
ALT: 11 U/L (ref 0–44)
AST: 18 U/L (ref 15–41)
Albumin: 2.7 g/dL — ABNORMAL LOW (ref 3.5–5.0)
Alkaline Phosphatase: 81 U/L (ref 38–126)
Anion gap: 5 (ref 5–15)
BUN: 17 mg/dL (ref 8–23)
CO2: 43 mmol/L — ABNORMAL HIGH (ref 22–32)
Calcium: 8.8 mg/dL — ABNORMAL LOW (ref 8.9–10.3)
Chloride: 93 mmol/L — ABNORMAL LOW (ref 98–111)
Creatinine, Ser: 0.55 mg/dL (ref 0.44–1.00)
GFR, Estimated: >60 mL/min (ref >60)
Glucose, Bld: 213 mg/dL — ABNORMAL HIGH (ref 70–99)
Potassium: 3.9 mmol/L (ref 3.5–5.1)
Sodium: 141 mmol/L (ref 135–145)
Total Bilirubin: 0.8 mg/dL (ref 0.3–1.2)
Total Protein: 6 g/dL — ABNORMAL LOW (ref 6.5–8.1)

## 2020-11-18 LAB — GLUCOSE, CAPILLARY
Glucose-Capillary: 163 mg/dL — ABNORMAL HIGH (ref 70–99)
Glucose-Capillary: 220 mg/dL — ABNORMAL HIGH (ref 70–99)
Glucose-Capillary: 228 mg/dL — ABNORMAL HIGH (ref 70–99)

## 2020-11-18 LAB — PROTIME-INR
INR: 3.2 — ABNORMAL HIGH (ref 0.8–1.2)
Prothrombin Time: 32.8 seconds — ABNORMAL HIGH (ref 11.4–15.2)

## 2020-11-18 LAB — MAGNESIUM: Magnesium: 2 mg/dL (ref 1.7–2.4)

## 2020-11-18 MED ORDER — TRAMADOL HCL 50 MG PO TABS
50.0000 mg | ORAL_TABLET | Freq: Four times a day (QID) | ORAL | Status: DC | PRN
Start: 2020-11-18 — End: 2020-11-21
  Administered 2020-11-18 – 2020-11-20 (×3): 50 mg via ORAL
  Filled 2020-11-18 (×4): qty 1

## 2020-11-18 MED ORDER — WARFARIN SODIUM 2 MG PO TABS
4.0000 mg | ORAL_TABLET | Freq: Once | ORAL | Status: AC
  Administered 2020-11-18: 4 mg via ORAL
  Filled 2020-11-18: qty 2

## 2020-11-18 NOTE — Progress Notes (Signed)
[facility] for Coumadin Indication: atrial fibrillation  Allergies  Allergen Reactions   Bee Venom Hives    Takes benadryl   Other Other (See Comments)    Raw foods with seeds give her "boils".  Avoids raw strawberries, blueberries. Tolerates cooked fruits, tomato sauce, bread/grains with seeds. [facility] 10/17/13 Berries with seeds Allergy to glutamine-C-quercet-selen-brom per [facility] 09/25/17   Zosyn [Piperacillin Sod-Tazobactam So] Rash    Itchy total body rash.   Alteplase Rash   Cefepime Rash   Fentanyl Other (See Comments)    Hallucinations, syncope     Metformin And Related Diarrhea   Vancomycin Rash    Patient Measurements: Height: '5\' 10"'$  (177.8 cm) Weight: 111.3 kg (245 lb 6 oz) IBW/kg (Calculated) : 68.5  Vital Signs: Temp: 98.1 F (36.7 C) (09/18 0310) Temp Source: Oral (09/18 0310) BP: 126/73 (09/18 0600) Pulse Rate: 81 (09/18 0310)  Labs: Recent Labs    11/16/20 0249 11/17/20 0452 11/18/20 0315  HGB 9.9* 9.7* 10.1*  HCT 35.4* 34.0* 35.2*  PLT 112* 103* 158  LABPROT 26.3* 30.9* 32.8*  INR 2.4* 3.0* 3.2*  CREATININE 0.67 0.50 0.55     Estimated Creatinine Clearance: 89.7 mL/min (by C-G formula based on SCr of 0.55 mg/dL).  Assessment: 70 y.o. female admitted with CHF.  Pharmacy consulted to continue Coumadin for history of Afib.  Spoke to [facility] staff, patient's Coumadin dose was increased on 9/12 to '11mg'$  PO daily.    INR supratherapeutic at 3.0.  INR increased with dose reduction. Will reduce today's dose. Patient remains on carbamazepine. Hemoglobin and platelet trending up; no bleeding reported.  Last dose of Levaquin 9/13, likely not affecting INR.   Goal of Therapy:  INR 2-3 Monitor platelets by anticoagulation protocol: Yes   Plan:  Coumadin 4 mg PO today Daily PT / INR  Thank you for allowing pharmacy to participate in this patient's care.  Reatha Harps, PharmD PGY1 Pharmacy Resident 11/18/2020 8:36  AM Check [email or web address] for unit specific pharmacy number

## 2020-11-18 NOTE — Progress Notes (Signed)
PROGRESS NOTE                                                                                                                                                                                                             Patient Demographics:    Donna [name], is a 70 y.o. female, DOB - 08-06-1950, [id number]  Outpatient Primary MD for the patient is O'Buch, Alvis Lemmings, PA-C    LOS - 5  Admit date - 11/12/2020    Chief Complaint  Patient presents with   Altered Mental Status       Brief Narrative (HPI from H&P)   Donna [name] is a 70 y.o. female with history of diastolic CHF last EF measured in 2019 was 55 to 60% with history of A. fib diabetes mellitus type 2, anemia, sleep apnea Parkinson's disease bedbound with sacral ulcers was brought to the ER after patient was found to have increasing peripheral edema with shortness of breath, was dignosed with CHF and admitted to the hospital.   Subjective:   Patient in bed, appears comfortable, denies any headache, no fever, no chest pain or pressure, no shortness of breath , no abdominal pain. No new focal weakness.  Has some chronic low back pain and some chronic bilateral knee pain from time to time, she says this is not new.   Assessment  & Plan :     Acute Hypoxic Resp. Failure due to Acute on chronic diastolic CHF last EF [id number].  Agree with IV Lasix, dose increased, continue Aldactone and beta-blocker, continue supportive care with oxygen and monitor, TTE  stable, symptoms are better, Cards following.  Encouraged the patient to sit up in chair in the daytime use I-S and flutter valve for pulmonary toiletry.  Will advance activity and titrate down oxygen as possible.  SpO2: 94 % O2 Flow Rate (L/min): 3 L/min  2.  Sacral decubitus ulcers present on admission with 1 out of 4 MRSA bacteremia.  Foley if needed for hygiene, wound care team consulted. 1/4 MRSA +ve BC, ID following on Vanco,  TTE -ve, TEE requested, Procal stable, defer management to ID, will likely need a PICC as well.  Also some chronic bilateral knee pain, on exam no effusion or redness, no warmth, there is some tenderness in both knees but patient claims that is chronic, MRI noted  and it was inconclusive.  ID following.   3.  OSA.  CPAP at night.  4.  Chronic anemia and thrombocytopenia.  Monitor.  5.  UTI.  Cultures noted, Rocephin >> Unasyn per ID .  6.  Underlying Parkinson's.  On combination of Sinemet and amantadine. ? Underlying dementia.  Does have underlying resting tremors.  7.  Hypertension.  Propranolol and ARB combination along with diuretics.  8.  Paroxysmal atrial fibrillation.  Mali vas 2 score of greater than 3.  On combination of propranolol and Coumadin.  Pharmacy monitoring INR.  9.  Obesity.  BMI 32.  Follow-up with PCP for weight loss.  10. Toxic Encephalopathy - likely hospital-acquired delirium along with multiple sedative medications, held Neurontin, Wellbutrin and Zoloft on 11/15/2020, encephalopathy much improved.  Continue to monitor.  11.  Hypokalemia and hypomagnesemia.  Replaced.  12. 11/17/20 short NSVT on tele - asymptomatic, EF > 65%, on B.Blocker, Mag was low - replaced.  13. DM type II.  On Lantus  and sliding scale.  Will monitor and adjust.  Lab Results  Component Value Date   HGBA1C 6.0 (H) 04/03/2019    CBG (last 3)  Recent Labs    11/17/20 1657 11/17/20 2107 11/18/20 0600  GLUCAP 159* 164* 163*   Lab Results  Component Value Date   INR 3.2 (H) 11/18/2020   INR 3.0 (H) 11/17/2020   INR 2.4 (H) 11/16/2020     Recent Labs  Lab 11/12/20 1433 11/12/20 1500 11/12/20 1556 11/13/20 1600 11/14/20 0133 11/14/20 0452 11/15/20 0409 11/16/20 0249 11/17/20 0452 11/18/20 0315  WBC  --   --    < >  --  8.3  --  9.2 6.8 6.5 6.3  HGB  --   --    < >  --  10.5*  --  9.8* 9.9* 9.7* 10.1*  HCT  --   --    < >  --  36.3  --  35.2* 35.4* 34.0* 35.2*  PLT  --    --    < >  --  140*  --  145* 112* 103* 158  BNP 412.3*  --   --   --  402.6*  --  322.2* 381.4* 374.3*  --   PROCALCITON  --   --   --  <0.10  --  <0.10 <0.10  --   --   --   AST 20  --   --   --  16  --  '15 16 20 18  '$ ALT 10  --   --   --  9  --  '8 9 10 11  '$ ALKPHOS 75  --   --   --  80  --  75 77 80 81  BILITOT 1.5*  --   --   --  1.3*  --  1.1 1.1 1.0 0.8  ALBUMIN 2.8*  --   --   --  2.8*  --  2.8* 2.8* 2.8* 2.7*  INR 1.8*  --    < >  --  1.8*  --  2.1* 2.4* 3.0* 3.2*  LATICACIDVEN  --  1.2  --   --   --   --   --   --   --   --   SARSCOV2NAA NEGATIVE  --   --   --   --   --   --   --   --   --    < > = values  in this interval not displayed.           Condition - Fair  Family Communication  :   Delilah Shan [phone] on 11/16/20, 11/17/20  Code Status :  Full  Consults  :  None  PUD Prophylaxis : PPI   Procedures  :      TTE - 1. Left ventricular ejection fraction, by estimation, is 60 to 65%. The left ventricle has normal function. The left ventricle has no regional wall motion abnormalities.  2. Right ventricular systolic function is mildly reduced. The right ventricular size is normal. There is severely elevated pulmonary artery systolic pressure.  3. Left atrial size was moderately dilated.  4. Right atrial size was severely dilated.  5. The mitral valve is degenerative. Trivial mitral valve regurgitation. No evidence of mitral stenosis.  6. Tricuspid valve regurgitation is moderate.  7. Compared with the echo 08/2017, aortic valve mean gradient has increased frm 9 mmHg to 17 mmHg. The aortic valve is normal in structure. There is moderate calcification of the aortic valve. There is moderate thickening of the aortic valve. Aortic valve regurgitation is not visualized. Mild aortic valve stenosis.  8. The inferior vena cava is dilated in size with <50% respiratory variability, suggesting right atrial pressure of 15 mmHg  CT Head - Non acute      Disposition Plan  :     Status is: Inpatient  Remains inpatient appropriate because:IV treatments appropriate due to intensity of illness or inability to take PO  Dispo: The patient is from: SNF              Anticipated d/c is to: SNF              Patient currently is not medically stable to d/c.   Difficult to place patient No  DVT Prophylaxis  :  Coumadin  Lab Results  Component Value Date   INR 3.2 (H) 11/18/2020   INR 3.0 (H) 11/17/2020   INR 2.4 (H) 11/16/2020    Lab Results  Component Value Date   PLT 158 11/18/2020    Diet :  Diet Order             Diet heart healthy/carb modified Room service appropriate? Yes; Fluid consistency: Thin; Fluid restriction: 1200 mL Fluid  Diet effective now                    Inpatient Medications  Scheduled Meds:  (feeding supplement) PROSource Plus  30 mL Oral TID BM   amantadine  100 mg Oral Daily   vitamin C  500 mg Oral Daily   carbamazepine  100 mg Oral BID   carbidopa-levodopa  1 tablet Oral TID WC   Chlorhexidine Gluconate Cloth  6 each Topical Daily   ferrous sulfate  325 mg Oral BID WC   furosemide  40 mg Intravenous Daily   insulin aspart  0-9 Units Subcutaneous TID WC   insulin glargine-yfgn  25 Units Subcutaneous QHS   losartan  12.5 mg Oral Daily   magnesium oxide  400 mg Oral BID   multivitamin with minerals  1 tablet Oral Daily   pantoprazole  40 mg Oral Daily   polyethylene glycol  17 g Oral Daily   propranolol  20 mg Oral TID   spironolactone  50 mg Oral Daily   vitamin B-12  500 mcg Oral Daily   warfarin  4 mg Oral ONCE-1600   Warfarin - Pharmacist Dosing Inpatient  Does not apply q1600   Continuous Infusions:  ampicillin-sulbactam (UNASYN) IV Stopped (11/18/20 0600)   vancomycin Stopped (11/18/20 0200)    PRN Meds:.acetaminophen **OR** acetaminophen, ondansetron (ZOFRAN) IV, traMADol  Antibiotics  :    Anti-infectives (From admission, onward)    Start     Dose/Rate Route Frequency Ordered Stop   11/16/20  0000  vancomycin (VANCOREADY) IVPB 1750 mg/350 mL        1,750 mg 175 mL/hr over 120 Minutes Intravenous Every 24 hours 11/15/20 0846     11/15/20 0500  DAPTOmycin (CUBICIN) 700 mg in sodium chloride 0.9 % IVPB  Status:  Discontinued        8 mg/kg  86.8 kg (Adjusted) 128 mL/hr over 30 Minutes Intravenous Daily 11/14/20 1013 11/14/20 1215   11/14/20 1315  vancomycin (VANCOREADY) IVPB 1250 mg/250 mL  Status:  Discontinued        1,250 mg 166.7 mL/hr over 90 Minutes Intravenous Every 12 hours 11/14/20 1215 11/15/20 0846   11/14/20 1115  Ampicillin-Sulbactam (UNASYN) 3 g in sodium chloride 0.9 % 100 mL IVPB        3 g 200 mL/hr over 30 Minutes Intravenous Every 6 hours 11/14/20 1025     11/14/20 1100  cefTRIAXone (ROCEPHIN) 1 g in sodium chloride 0.9 % 100 mL IVPB  Status:  Discontinued        1 g 200 mL/hr over 30 Minutes Intravenous Every 24 hours 11/14/20 0958 11/14/20 1025   11/14/20 0230  DAPTOmycin (CUBICIN) 850 mg in sodium chloride 0.9 % IVPB        8 mg/kg  104 kg 134 mL/hr over 30 Minutes Intravenous  Once 11/14/20 0130 11/14/20 0501   11/13/20 1500  levofloxacin (LEVAQUIN) IVPB 750 mg  Status:  Discontinued        750 mg 100 mL/hr over 90 Minutes Intravenous Every 24 hours 11/12/20 2204 11/14/20 0951   11/12/20 1500  levofloxacin (LEVAQUIN) IVPB 750 mg        750 mg 100 mL/hr over 90 Minutes Intravenous  Once 11/12/20 1454 11/12/20 1704        Time Spent in minutes  30   Lala Lund M.D on 11/18/2020 at 10:54 AM  To page go to [email or web address]   [facility] -  Office  [phone]  See all Orders from today for further details    Objective:   Vitals:   11/18/20 0100 11/18/20 0310 11/18/20 0318 11/18/20 0600  BP: 103/65 (!) 103/48  126/73  Pulse:  81    Resp: '17 14  16  '$ Temp:  98.1 F (36.7 C)    TempSrc:  Oral    SpO2: 97% 98%  94%  Weight:   111.3 kg   Height:        Wt Readings from Last 3 Encounters:  11/18/20 111.3 kg  08/18/19 114.8 kg   05/11/19 114.8 kg     Intake/Output Summary (Last 24 hours) at 11/18/2020 1054 Last data filed at 11/18/2020 0925 Gross per 24 hour  Intake 925.92 ml  Output 2600 ml  Net -1674.08 ml     Physical Exam  Awake Alert x 1 - ? baseline, No new F.N deficits, some resting tremors [place].AT,PERRAL Supple Neck,No JVD, No cervical lymphadenopathy appriciated.  Symmetrical Chest wall movement, Good air movement bilaterally, CTAB RRR,No Gallops, Rubs or new Murmurs, No Parasternal Heave +ve B.Sounds, Abd Soft, No tenderness, No organomegaly appriciated, No rebound - guarding or rigidity. No Cyanosis, Clubbing or edema, No  new Rash or bruise      RN pressure injury documentation: Pressure Injury 11/12/20 Sacrum Mid Stage 4 - Full thickness tissue loss with exposed bone, tendon or muscle. red and pink bedding, tunneled. (Active)  11/12/20 1555  Location: Sacrum  Location Orientation: Mid  Staging: Stage 4 - Full thickness tissue loss with exposed bone, tendon or muscle.  Wound Description (Comments): red and pink bedding, tunneled.  Present on Admission: Yes     Pressure Injury Hip Right;Lateral Stage 2 -  Partial thickness loss of dermis presenting as a shallow open injury with a red, pink wound bed without slough. (Active)     Location: Hip  Location Orientation: Right;Lateral  Staging: Stage 2 -  Partial thickness loss of dermis presenting as a shallow open injury with a red, pink wound bed without slough.  Wound Description (Comments):   Present on Admission: -- (Not sure,)     Pressure Injury Hip Right;Lateral Stage 2 -  Partial thickness loss of dermis presenting as a shallow open injury with a red, pink wound bed without slough. (Active)     Location: Hip  Location Orientation: Right;Lateral  Staging: Stage 2 -  Partial thickness loss of dermis presenting as a shallow open injury with a red, pink wound bed without slough.  Wound Description (Comments):   Present on Admission: --  (Not sure, I noted it;s not documented)     Data Review:    CBC Recent Labs  Lab 11/14/20 0133 11/15/20 0409 11/16/20 0249 11/17/20 0452 11/18/20 0315  WBC 8.3 9.2 6.8 6.5 6.3  HGB 10.5* 9.8* 9.9* 9.7* 10.1*  HCT 36.3 35.2* 35.4* 34.0* 35.2*  PLT 140* 145* 112* 103* 158  MCV 91.0 93.6 92.9 90.4 90.5  MCH 26.3 26.1 26.0 25.8* 26.0  MCHC 28.9* 27.8* 28.0* 28.5* 28.7*  RDW 17.1* 17.0* 17.1* 16.9* 16.8*  LYMPHSABS 0.7 0.6* 0.6* 0.5* 0.6*  MONOABS 0.8 1.0 0.6 0.5 0.6  EOSABS 0.1 0.2 0.2 0.3 0.3  BASOSABS 0.0 0.0 0.0 0.0 0.0    Recent Labs  Lab 11/12/20 1433 11/12/20 1500 11/12/20 1556 11/13/20 1600 11/14/20 0133 11/14/20 0452 11/15/20 0409 11/16/20 0249 11/17/20 0452 11/18/20 0315  NA 139  --    < >  --  140  --  141 142 141 141  K 3.5  --    < >  --  3.7  --  3.6 3.3* 3.5 3.9  CL 88*  --    < >  --  90*  --  91* 89* 92* 93*  CO2 41*  --    < >  --  41*  --  42* 42* 38* 43*  GLUCOSE 138*  --    < >  --  172*  --  145* 178* 162* 213*  BUN 14  --    < >  --  14  --  '15 20 19 17  '$ CREATININE 0.52  --    < >  --  0.53  --  0.59 0.67 0.50 0.55  CALCIUM 8.3*  --    < >  --  8.8*  --  8.6* 8.9 8.6* 8.8*  AST 20  --   --   --  16  --  '15 16 20 18  '$ ALT 10  --   --   --  9  --  '8 9 10 11  '$ ALKPHOS 75  --   --   --  80  --  75  77 80 81  BILITOT 1.5*  --   --   --  1.3*  --  1.1 1.1 1.0 0.8  ALBUMIN 2.8*  --   --   --  2.8*  --  2.8* 2.8* 2.8* 2.7*  MG 1.6*  --    < >  --  1.7  --  1.7 1.7 1.6* 2.0  PROCALCITON  --   --   --  <0.10  --  <0.10 <0.10  --   --   --   LATICACIDVEN  --  1.2  --   --   --   --   --   --   --   --   INR 1.8*  --    < >  --  1.8*  --  2.1* 2.4* 3.0* 3.2*  BNP 412.3*  --   --   --  402.6*  --  322.2* 381.4* 374.3*  --    < > = values in this interval not displayed.    ------------------------------------------------------------------------------------------------------------------ No results for input(s): CHOL, HDL, LDLCALC, TRIG, CHOLHDL, LDLDIRECT  in the last 72 hours.  Lab Results  Component Value Date   HGBA1C 6.0 (H) 04/03/2019   ------------------------------------------------------------------------------------------------------------------ No results for input(s): TSH, T4TOTAL, T3FREE, THYROIDAB in the last 72 hours.  Invalid input(s): FREET3  Cardiac Enzymes No results for input(s): CKMB, TROPONINI, MYOGLOBIN in the last 168 hours.  Invalid input(s): CK ------------------------------------------------------------------------------------------------------------------    Component Value Date/Time   BNP 374.3 (H) 11/17/2020 0452     Radiology Reports DG Knee 1-2 Views Right  Result Date: 11/16/2020 CLINICAL DATA:  MRSA bacteremia. EXAM: RIGHT KNEE - 1-2 VIEW COMPARISON:  07/19/2020 FINDINGS: Similar appearance of the medial unicompartmental arthroplasty of the right knee compared to 07/19/2020. No abnormal lucency tracking along the hardware components is currently identified to indicate loosening or infection along the prosthesis at this time. There is substantial articular space narrowing in the patellofemoral joint with associated extensive spurring, along with mild marginal spurring in the lateral compartment. Stable lucency along the distal femoral metaphysis medially, potentially with mild surrounding sclerosis. This is not changed from 07/19/2020 but new compared to preoperative radiographs from 05/25/2014, significance uncertain. There is a trace knee effusion in the suprapatellar bursa. Equivocal thickening of the distal quadriceps tendon, along with some mild chronic heterotopic calcification along the distal quadriceps at the level of the distal femoral diaphysis. IMPRESSION: 1. Medial unicompartmental arthroplasty of the right knee without lucency along the margins to suggest prosthesis loosening or infection. 2. Trace knee effusion. 3. Stable lucency along the distal femoral metaphysis medially potentially with mild  surrounding sclerosis, no change from 07/19/2020. This could be some form of chronic postoperative reactive lesion but is technically nonspecific and was not appreciable on the preoperative radiograph of 05/25/2014. Chronic osteomyelitis is not entirely excluded although is not favored. 4. Suspected distal quadriceps tendinopathy. Electronically Signed   By: Van Clines M.D.   On: 11/16/2020 13:23   CT HEAD WO CONTRAST (5MM)  Result Date: 11/13/2020 CLINICAL DATA:  Altered mental status. EXAM: CT HEAD WITHOUT CONTRAST TECHNIQUE: Contiguous axial images were obtained from the base of the skull through the vertex without intravenous contrast. COMPARISON:  Jul 19, 2020 FINDINGS: Brain: There is mild cerebral atrophy with widening of the extra-axial spaces and ventricular dilatation. There are areas of decreased attenuation within the white matter tracts of the supratentorial brain, consistent with microvascular disease changes. Vascular: No hyperdense vessel or unexpected calcification. Skull: Normal.  Negative for fracture or focal lesion. Sinuses/Orbits: No acute finding. Other: None. IMPRESSION: 1. Generalized cerebral atrophy. 2. No acute intracranial abnormality. Electronically Signed   By: Virgina Norfolk M.D.   On: 11/13/2020 03:39   CT PELVIS W CONTRAST  Result Date: 11/14/2020 CLINICAL DATA:  Osteomyelitis. EXAM: CT PELVIS WITH CONTRAST TECHNIQUE: Multidetector CT imaging of the pelvis was performed using the standard protocol following the bolus administration of intravenous contrast. CONTRAST:  139m OMNIPAQUE IOHEXOL 350 MG/ML SOLN COMPARISON:  Jul 19, 2020.  April 06, 2019. FINDINGS: Urinary Tract: Urinary bladder is decompressed secondary to Foley catheter. Bowel:  Unremarkable visualized pelvic bowel loops. Vascular/Lymphatic: No pathologically enlarged lymph nodes. No significant vascular abnormality seen. Reproductive: Status post hysterectomy. No definite adnexal abnormality is  noted. Other: No ascites is noted. 6.8 cm presacral mass is noted most consistent with sacral myelolipoma as noted on prior MRI. Decubitus ulceration is seen overlying the lower sacrum and coccyx. Musculoskeletal: No suspicious bone lesions identified. Status post right total hip arthroplasty. No lytic destruction is seen to suggest osteomyelitis. IMPRESSION: Decubitus ulcer is seen overlying the lower sacrum and coccyx, but no underlying osteomyelitis is noted. Stable 6.8 cm presacral mass is noted most consistent with sacral myelolipoma as noted on prior MRI. Electronically Signed   By: JMarijo ConceptionM.D.   On: 11/14/2020 15:41   MR KNEE LEFT WO CONTRAST  Result Date: 11/16/2020 CLINICAL DATA:  MRSA bacteremia, drainage and erythema from sacral wound, concern for potential septic arthritis EXAM: MRI OF THE LEFT KNEE WITHOUT CONTRAST TECHNIQUE: Multiplanar, multisequence MR imaging of the knee was performed. No intravenous contrast was administered. Three severely motion degraded sequences were obtained and the patient refused all further imaging. COMPARISON:  09/03/2017 radiographs FINDINGS: 3 severely motion degraded sequences are provided, which substantially reduces diagnostic sensitivity and specificity. The patient refused other imaging. MENISCI Medial meniscus: Ill definition of the root of the posterior horn medial meniscus, cannot exclude radial tear. Lateral meniscus: Ill definition of the root of the posterior horn lateral meniscus, cannot exclude tear. LIGAMENTS Cruciates:  Grossly intact Collaterals:  Grossly intact CARTILAGE Patellofemoral: At least moderate chondral thinning with prominent spurring. Medial: Severe full-thickness articular cartilage loss. Prominent marginal spurring. Lateral:  Prominent marginal spurring. Joint:  Small knee effusion.  Suspected mild synovitis. Popliteal Fossa: Low-grade infiltrative edema but not disproportionate to the generalized subcutaneous edema. Extensor  Mechanism:  Grossly intact Bones: Suspected chronic mild avascular necrosis in the distal femoral metaphysis. Other: 2.3 by 1.3 by 3.4 cm (volume = 5.3 cm^3) proteinaceous or hemorrhagic fluid signal intensity structure along the superficial fascia margin of the left proximal calf superficial to the fibula on image 23 series 5. This has high T2 and high T1 signal characteristics and also marginal low signal which may represent marginal hemosiderin deposition. Accordingly a chronic hematoma is favored. IMPRESSION: 1. Today's exam consists of 3 severely motion degraded sequences. The patient refused completion of the exam and any further MR imaging. This substantially reduces diagnostic sensitivity and specificity. 2. There is a small knee effusion with suspected mild synovitis. The effusion is less than I would typically expect in the setting of septic joint, and there is no overt marrow edema along the articular surfaces to indicate concomitant osteomyelitis. That said, early infection cannot be totally excluded based on the images provided. 3. Ill definition of the roots of the posterior horn medial meniscus and lateral meniscus, cannot exclude radial tears. 4. Severe osteoarthritis especially in the medial compartment.  5. Generalized infiltrative edema in the subcutaneous tissues and popliteal region. 6. About 5.3 cm hematoma along the superficial fascia margin lateral to the proximal fibia, with hemosiderin along the margins suggesting some chronicity. 7. Mild chronic AVN in the distal femoral metaphysis. Electronically Signed   By: Van Clines M.D.   On: 11/16/2020 13:15   DG Chest Port 1 View  Result Date: 11/13/2020 CLINICAL DATA:  SOB (shortness of breath) R06.02 (ICD-10-CM) EXAM: PORTABLE CHEST 1 VIEW COMPARISON:  11/12/2020. FINDINGS: Similar mild enlargement cardiac silhouette. Similar pulmonary vascular congestion. Similar bibasilar opacities and probable small right pleural effusion. Mild  interstitial prominence, possibly mild interstitial edema. No visible pneumothorax IMPRESSION: No substantial change from the prior. Similar cardiomegaly, pulmonary vascular congestion, probable small right pleural effusion and possible mild interstitial edema. Early pneumonia is not excluded at the lung bases with similar bibasilar opacities. Electronically Signed   By: Margaretha Sheffield M.D.   On: 11/13/2020 12:12   DG Chest Port 1 View  Result Date: 11/12/2020 CLINICAL DATA:  Questionable sepsis.  Evaluate for abnormality. EXAM: PORTABLE CHEST 1 VIEW COMPARISON:  Radiographs 04/06/2019 and 04/02/2019.  CT 07/19/2020. FINDINGS: 1556 hours. Right arm PICC has been removed. The heart remains enlarged. There is increased vascular congestion with patchy opacities at both lung bases and a probable small right pleural effusion. No evidence of pneumothorax. The bones appear unchanged. Telemetry leads overlie the chest. IMPRESSION: Cardiomegaly with vascular congestion, probable small right pleural effusion and probable mild edema. Cannot exclude early pneumonia at the lung bases. Radiographic follow up recommended. Electronically Signed   By: Richardean Sale M.D.   On: 11/12/2020 16:31   ECHOCARDIOGRAM LIMITED  Result Date: 11/14/2020    ECHOCARDIOGRAM LIMITED REPORT   Patient Name:   Donna [name] Date of Exam: 11/14/2020 Medical Rec #:  [id number]   Height:       70.0 in Accession #:    [id number]  Weight:       252.0 lb Date of Birth:  Jun 20, 1950  BSA:          2.303 m Patient Age:    79 years    BP:           123/66 mmHg Patient Gender: F           HR:           80 bpm. Exam Location:  Inpatient Procedure: Limited Echo, Color Doppler and Cardiac Doppler Indications:    [id number] Acute diastolic (congestive) heart failure  History:        Patient has prior history of Echocardiogram examinations, most                 recent 07/20/2020. CHF, Arrythmias:Atrial Fibrillation; Risk                 Factors:Hypertension,  Diabetes and Sleep Apnea. Prior performed                 at [place]:    [place] Referring Phys: Arnell Asal Margaree Mackintosh [place]  1. Left ventricular ejection fraction, by estimation, is 60 to 65%. The left ventricle has normal function. The left ventricle has no regional wall motion abnormalities.  2. Right ventricular systolic function is mildly reduced. The right ventricular size is normal. There is severely elevated pulmonary artery systolic pressure.  3. Left atrial size was moderately dilated.  4. Right atrial size was severely dilated.  5. The mitral valve is degenerative.  Trivial mitral valve regurgitation. No evidence of mitral stenosis.  6. Tricuspid valve regurgitation is moderate.  7. Compared with the echo 08/2017, aortic valve mean gradient has increased frm 9 mmHg to 17 mmHg. The aortic valve is normal in structure. There is moderate calcification of the aortic valve. There is moderate thickening of the aortic valve. Aortic valve regurgitation is not visualized. Mild aortic valve stenosis.  8. The inferior vena cava is dilated in size with <50% respiratory variability, suggesting right atrial pressure of 15 mmHg. FINDINGS  Left Ventricle: Left ventricular ejection fraction, by estimation, is 60 to 65%. The left ventricle has normal function. The left ventricle has no regional wall motion abnormalities. The left ventricular internal cavity size was normal in size. There is  no left ventricular hypertrophy. Right Ventricle: The right ventricular size is normal. No increase in right ventricular wall thickness. Right ventricular systolic function is mildly reduced. There is severely elevated pulmonary artery systolic pressure. The tricuspid regurgitant velocity is 3.60 m/s, and with an assumed right atrial pressure of 15 mmHg, the estimated right ventricular systolic pressure is [id number] mmHg. Left Atrium: Left atrial size was moderately dilated. Right Atrium: Right atrial size was  severely dilated. Pericardium: There is no evidence of pericardial effusion. Mitral Valve: The mitral valve is degenerative in appearance. Mild mitral annular calcification. Trivial mitral valve regurgitation. No evidence of mitral valve stenosis. Tricuspid Valve: The tricuspid valve is normal in structure. Tricuspid valve regurgitation is moderate . No evidence of tricuspid stenosis. Aortic Valve: Compared with the echo 08/2017, aortic valve mean gradient has increased frm 9 mmHg to 17 mmHg. The aortic valve is normal in structure. There is moderate calcification of the aortic valve. There is moderate thickening of the aortic valve. Aortic valve regurgitation is not visualized. Mild aortic stenosis is present. Aortic valve mean gradient measures 17.0 mmHg. Aortic valve peak gradient measures 26.6 mmHg. Aortic valve area, by VTI measures 1.14 cm. Pulmonic Valve: The pulmonic valve was normal in structure. Pulmonic valve regurgitation is not visualized. No evidence of pulmonic stenosis. Aorta: The aortic root is normal in size and structure. Venous: The inferior vena cava is dilated in size with less than 50% respiratory variability, suggesting right atrial pressure of 15 mmHg. IAS/Shunts: No atrial level shunt detected by color flow Doppler. LEFT VENTRICLE PLAX 2D LVOT diam:     1.80 cm LV SV:         60 LV SV Index:   26 LVOT Area:     2.54 cm  AORTIC VALVE AV Area (Vmax):    1.15 cm AV Area (Vmean):   1.20 cm AV Area (VTI):     1.14 cm AV Vmax:           258.00 cm/s AV Vmean:          193.000 cm/s AV VTI:            0.521 m AV Peak Grad:      26.6 mmHg AV Mean Grad:      17.0 mmHg LVOT Vmax:         117.00 cm/s LVOT Vmean:        91.000 cm/s LVOT VTI:          0.234 m LVOT/AV VTI ratio: 0.45 TRICUSPID VALVE TR Peak grad:   51.8 mmHg TR Vmax:        360.00 cm/s  SHUNTS Systemic VTI:  0.23 m Systemic Diam: 1.80 cm Skeet Latch MD Electronically signed by Jonelle Sidle  Oval Linsey MD Signature Date/Time:  11/14/2020/5:37:02 PM    Final

## 2020-11-18 NOTE — Plan of Care (Signed)

## 2020-11-18 NOTE — Progress Notes (Signed)
Spoke with Claiborne Billings RN re PICC order.  No d/c orders for today, unknown d/c date.  States son to give consent for procedure. Plan on PICC placement 11/19/20 at this time.

## 2020-11-18 NOTE — Progress Notes (Signed)
Pharmacy Antibiotic Note  Donna [name] is a 70 y.o. female admitted on 11/12/2020 with bacteremia.  Pharmacy has been consulted for vancomycin dosing.  Donna [name] is a 70 y.o. female admitted from [facility] with AMS, increased peripheral edema. Found to have fever, MRSA bacteremia 1/4 bottles preliminarily, chronic sacral wound with malodorous drainage. WBC WNL and afebrile. No stop date has been mentioned thus far. Given MRSA bacteremia, will proceed with peak/trough at next dose.  Plan: Continue Vancomycin 1750 mg IV q24h (eAUC 479, Scr 0.8) Goal AUC 400-550 Continue Unasyn 3 g every 6 hours Monitor renal function, clinical course, and micro data  Height: '5\' 10"'$  (177.8 cm) Weight: 111.3 kg (245 lb 6 oz) IBW/kg (Calculated) : 68.5  Temp (24hrs), Avg:98 F (36.7 C), Min:97.9 F (36.6 C), Max:98.1 F (36.7 C)  Recent Labs  Lab 11/12/20 1500 11/12/20 1945 11/14/20 0133 11/15/20 0409 11/16/20 0249 11/17/20 0452 11/18/20 0315  WBC  --    < > 8.3 9.2 6.8 6.5 6.3  CREATININE  --    < > 0.53 0.59 0.67 0.50 0.55  LATICACIDVEN 1.2  --   --   --   --   --   --    < > = values in this interval not displayed.     Estimated Creatinine Clearance: 89.7 mL/min (by C-G formula based on SCr of 0.55 mg/dL).    Allergies  Allergen Reactions   Bee Venom Hives    Takes benadryl   Other Other (See Comments)    Raw foods with seeds give her "boils".  Avoids raw strawberries, blueberries. Tolerates cooked fruits, tomato sauce, bread/grains with seeds. [facility] 10/17/13 Berries with seeds Allergy to glutamine-C-quercet-selen-brom per [facility] 09/25/17   Zosyn [Piperacillin Sod-Tazobactam So] Rash    Itchy total body rash.   Alteplase Rash   Cefepime Rash   Fentanyl Other (See Comments)    Hallucinations, syncope     Metformin And Related Diarrhea   Vancomycin Rash    Antimicrobials this admission: Daptomcyin 9/14 x1 dose Levofloxacin 9/12 >> 9/13 Unasyn 9/14 >>  Dose adjustments this  admission: Vancomycin 1750 mg IV Q24h Vancomycin 1250 mg IV Q24h  Microbiology results: 9/15 Bcx: NGTD 9/12 BCx: MRSA 1/4 9/12 UCx: Providencia stuartii   Thank you for allowing pharmacy to participate in this patient's care.  Reatha Harps, PharmD PGY1 Pharmacy Resident 11/18/2020 8:25 AM Check [email or web address] for unit specific pharmacy number

## 2020-11-19 ENCOUNTER — Inpatient Hospital Stay (HOSPITAL_COMMUNITY): Payer: Medicare Other

## 2020-11-19 DIAGNOSIS — I509 Heart failure, unspecified: Secondary | ICD-10-CM

## 2020-11-19 DIAGNOSIS — Z7189 Other specified counseling: Secondary | ICD-10-CM

## 2020-11-19 DIAGNOSIS — M25562 Pain in left knee: Secondary | ICD-10-CM | POA: Diagnosis not present

## 2020-11-19 DIAGNOSIS — L89324 Pressure ulcer of left buttock, stage 4: Secondary | ICD-10-CM | POA: Diagnosis not present

## 2020-11-19 DIAGNOSIS — I5033 Acute on chronic diastolic (congestive) heart failure: Secondary | ICD-10-CM | POA: Diagnosis not present

## 2020-11-19 DIAGNOSIS — Z515 Encounter for palliative care: Secondary | ICD-10-CM

## 2020-11-19 DIAGNOSIS — Z789 Other specified health status: Secondary | ICD-10-CM

## 2020-11-19 DIAGNOSIS — R7881 Bacteremia: Secondary | ICD-10-CM | POA: Diagnosis not present

## 2020-11-19 DIAGNOSIS — Z66 Do not resuscitate: Secondary | ICD-10-CM

## 2020-11-19 DIAGNOSIS — B9562 Methicillin resistant Staphylococcus aureus infection as the cause of diseases classified elsewhere: Secondary | ICD-10-CM | POA: Diagnosis not present

## 2020-11-19 LAB — CBC WITH DIFFERENTIAL/PLATELET
Abs Immature Granulocytes: 0.03 10*3/uL (ref 0.00–0.07)
Basophils Absolute: 0 10*3/uL (ref 0.0–0.1)
Basophils Relative: 1 %
Eosinophils Absolute: 0.3 10*3/uL (ref 0.0–0.5)
Eosinophils Relative: 4 %
HCT: 33.3 % — ABNORMAL LOW (ref 36.0–46.0)
Hemoglobin: 9.5 g/dL — ABNORMAL LOW (ref 12.0–15.0)
Immature Granulocytes: 0 %
Lymphocytes Relative: 9 %
Lymphs Abs: 0.7 10*3/uL (ref 0.7–4.0)
MCH: 25.5 pg — ABNORMAL LOW (ref 26.0–34.0)
MCHC: 28.5 g/dL — ABNORMAL LOW (ref 30.0–36.0)
MCV: 89.3 fL (ref 80.0–100.0)
Monocytes Absolute: 0.7 10*3/uL (ref 0.1–1.0)
Monocytes Relative: 10 %
Neutro Abs: 5.3 10*3/uL (ref 1.7–7.7)
Neutrophils Relative %: 76 %
Platelets: 169 10*3/uL (ref 150–400)
RBC: 3.73 MIL/uL — ABNORMAL LOW (ref 3.87–5.11)
RDW: 16.8 % — ABNORMAL HIGH (ref 11.5–15.5)
WBC: 7 10*3/uL (ref 4.0–10.5)
nRBC: 0 % (ref 0.0–0.2)

## 2020-11-19 LAB — VANCOMYCIN, PEAK: Vancomycin Pk: 36 ug/mL (ref 30–40)

## 2020-11-19 LAB — COMPREHENSIVE METABOLIC PANEL
ALT: 11 U/L (ref 0–44)
AST: 17 U/L (ref 15–41)
Albumin: 2.7 g/dL — ABNORMAL LOW (ref 3.5–5.0)
Alkaline Phosphatase: 86 U/L (ref 38–126)
Anion gap: 13 (ref 5–15)
BUN: 15 mg/dL (ref 8–23)
CO2: 37 mmol/L — ABNORMAL HIGH (ref 22–32)
Calcium: 8.8 mg/dL — ABNORMAL LOW (ref 8.9–10.3)
Chloride: 90 mmol/L — ABNORMAL LOW (ref 98–111)
Creatinine, Ser: 0.45 mg/dL (ref 0.44–1.00)
GFR, Estimated: >60 mL/min (ref >60)
Glucose, Bld: 167 mg/dL — ABNORMAL HIGH (ref 70–99)
Potassium: 3.6 mmol/L (ref 3.5–5.1)
Sodium: 140 mmol/L (ref 135–145)
Total Bilirubin: 0.9 mg/dL (ref 0.3–1.2)
Total Protein: 6 g/dL — ABNORMAL LOW (ref 6.5–8.1)

## 2020-11-19 LAB — GLUCOSE, CAPILLARY
Glucose-Capillary: 149 mg/dL — ABNORMAL HIGH (ref 70–99)
Glucose-Capillary: 218 mg/dL — ABNORMAL HIGH (ref 70–99)
Glucose-Capillary: 206 mg/dL — ABNORMAL HIGH (ref 70–99)
Glucose-Capillary: 163 mg/dL — ABNORMAL HIGH (ref 70–99)

## 2020-11-19 LAB — PROTIME-INR
INR: 2.4 — ABNORMAL HIGH (ref 0.8–1.2)
Prothrombin Time: 26.3 seconds — ABNORMAL HIGH (ref 11.4–15.2)

## 2020-11-19 LAB — MAGNESIUM: Magnesium: 1.9 mg/dL (ref 1.7–2.4)

## 2020-11-19 MED ORDER — WARFARIN SODIUM 2 MG PO TABS
4.0000 mg | ORAL_TABLET | Freq: Once | ORAL | Status: AC
  Administered 2020-11-19: 4 mg via ORAL
  Filled 2020-11-19: qty 2

## 2020-11-19 MED ORDER — SODIUM CHLORIDE 0.9% FLUSH: 10.0000 mL | INTRAVENOUS | Status: DC | PRN

## 2020-11-19 MED ORDER — ALPRAZOLAM 0.5 MG PO TABS: 0.5000 mg | ORAL_TABLET | Freq: Three times a day (TID) | ORAL | Status: DC | PRN

## 2020-11-19 MED ORDER — SODIUM CHLORIDE 0.9% FLUSH
10.0000 mL | Freq: Two times a day (BID) | INTRAVENOUS | Status: DC
  Administered 2020-11-19 – 2020-11-20 (×4): 10 mL

## 2020-11-19 MED ORDER — ALPRAZOLAM 0.5 MG PO TABS
0.5000 mg | ORAL_TABLET | Freq: Three times a day (TID) | ORAL | Status: DC | PRN
  Administered 2020-11-19 – 2020-11-20 (×4): 0.5 mg via ORAL
  Filled 2020-11-19 (×4): qty 1

## 2020-11-19 NOTE — TOC Progression Note (Signed)
Transition of Care [facility]) - Progression Note    Patient Details  Name: Donna [name] MRN: [id number] Date of Birth: 16-Feb-1951  Transition of Care [facility]) CM/SW Contact  Reece Agar, [place] Phone Number: 11/19/2020, 5:23 PM  Clinical Narrative:    CSW spoke with pt son about medicare coverage for pt return to [place]. CSW reached out to [place] at [place] and will wait on a response before pt DC. Pt son confirmed having started Medicaid process and would like for pt to stay at [facility] for LTC if there is any availability. CSW will continue to follow for DC planning needs.  Expected Discharge Plan: [place] Barriers to Discharge: Continued Medical Work up  Expected Discharge Plan and Services Expected Discharge Plan: [place] In-house Referral: Clinical Social Work   Post Acute Care Choice: [place] Living arrangements for the past 2 months: [place], Single Family Home                                       Social Determinants of Health (SDOH) Interventions    Readmission Risk Interventions No flowsheet data found.

## 2020-11-19 NOTE — Consult Note (Signed)
Consultation Note Date: 11/19/2020   Patient Name: Donna [name]  DOB: [birth year]  MRN: [id number]  Age / Sex: 70 y.o., female  PCP: Janine Limbo, PA-C Referring Physician: Thurnell Lose, MD  Reason for Consultation: Establishing goals of care, "Parkinsons, dementia, DNR, per son - wants Pall involved"  HPI/Patient Profile: 70 y.o. female  with past medical history of diastolic CHF last EF measured in 2019 was 55 to 60%, a. Fib, diabetes mellitus type 2, anemia, sleep apnea on CPap, Parkinson's disease bedbound at baseline presented to the ED on 11/12/20 from [facility] after staff were concerned about AMS. Patient was recently treated for UTI. Patient was admitted on 11/12/2020 with acute on chronic CHF exacerbation and fever likely secondary to UTI vs pneumonia.   ED Course: In the ER patient is found to be febrile with temperature 100 F chest x-ray shows congestion with pleural effusion pneumonia not ruled out UA is concerning for UTI.  Labs are significant for magnesium of 1.6 platelets have decreased from usual it is around 125 hemoglobin is 10.6.  Patient was given Lasix 40 mg IV for CHF and Levaquin started for possible UTI/pneumonia.  COVID test was negative.    Clinical Assessment and Goals of Care: I have reviewed medical records including EPIC notes, labs, and imaging. Received report from primary RN - acute concern of patient's anxious state.    Went to visit patient at bedside - no family/visitors present. Patient was lying in bed awake, alert, oriented to self only, but not able to participate in meaningful conversation or complex medical decision making. No signs or non-verbal gestures of pain noted. No respiratory distress, increased work of breathing, or secretions noted. Patient does appear very anxious. She denies pain or shortness of breath. She reports being thirsty - her water was  sitting in front of her on table within reach. With cuing, she was able to pick up cup, but was not able to drink unassisted. I assisted her with taking several sips of water.   Met with son/Carlton via phone  to discuss diagnosis, prognosis, GOC, EOL wishes, disposition, and options.  I introduced Palliative Medicine as specialized medical care for people living with serious illness. It focuses on providing relief from the symptoms and stress of a serious illness. The goal is to improve quality of life for both the patient and the family.  We discussed a brief life review of the patient as well as functional and nutritional status. Patient is newly widowed as of July 2022 - they had two sons together. Prior to hospitalization, patient was at [facility] receiving rehab. Wilber Oliphant tells me she has been there since right after her husband passed. Insurance benefit covered 100 days and Carlton/patient have been paying OOP since benefit ended. Wilber Oliphant has been working on getting the patient Medicaid but due to his father's death, things got put on "hold." Wilber Oliphant tells me he noticed his mother's decline after his father/her husband passed away - he notes she has "gone downhill." Patient is  now bedbound, not aware of surroundings or have meaningful conversations, and needs assistance with all ADLs. Recently, she also has required assistance with feeding. Wilber Oliphant reports patient's current appetite as poor.    We discussed patient's current illness and what it means in the larger context of patient's on-going co-morbidities.  Wilber Oliphant understands that Parkinson's is a progressive, non-curable disease underlying the patient's current acute medical conditions. Natural disease trajectory and expectations at EOL were discussed. I attempted to elicit values and goals of care important to the patient. The difference between aggressive medical intervention and comfort care was considered in light of the patient's goals of  care. Wilber Oliphant tells me he does not think PT is beneficial or would help patient going forward. We discussed what is needed to have a positive rehabilitation experience - including adequate nutritional intake as well as patient willing and wanting to participate. Patient likely would not find benefit in continued PT. We also discussed patient's high risk for recurrent hospitalizations, especially due to infections secondary to stage IV sacral wound. We discussed that decreased oral intake affects wound healing. Wilber Oliphant states his goal is for the patient not to be brought "back and fourth" to the hospital. He feels the patient does not have a good quality of life and states "this is not the way she would want to live." The patient has also told him that she's ready to "go be with her husband." Wilber Oliphant knows that his goal at this time is for patient to receive recommended course of antibiotics but does not want rehospitalization if she declines.   Hospice and Palliative Care services outpatient were explained and offered. Discussion on the difference between Palliative and Hospice care. Palliative care and hospice have similar goals of managing symptoms, promoting comfort, improving quality of life, and maintaining a person's dignity. However, palliative care may be offered during any phase of a serious illness, while hospice care is usually offered when a person is expected to live for 6 months or less.  We discussed dispo options to include: hospice at LTC vs continue antibiotics at rehab or LTC with outpatient Palliative Care to follow. Can transition to hospice care after rehab and antibiotics are completed or if patient declines, whichever comes first. Discussed that hospice could be started at LTC possibly while antibiotic treatment is going; however, this would be at the discretion of hospice organization. Reviewed insurance implications for options as well. Wilber Oliphant wonders if medicare benefit for 100 days  at rehab will start over at discharge or if he would need to continue to pay OOP. We also discussed LTC in context of medicare vs OOP cost. After discussion, Wilber Oliphant would like to speak with TOC to get more information on cost/benefits. If insurance will cover rehab, that would be the goal. If not, Wilber Oliphant will likely pick rehab vs LTC on whichever financially is feasible OOP. His hope is for discharge to LTC as patient will likely not benefit from rehab. I explained TOC would reach out to assist in answering financial/insurance questions. Wilber Oliphant understands that outpatient Palliative Care can transition patient to hospice care at any time.  Provided education and counseling at length on the philosophy and benefits of hospice care. Discussed that it offers a holistic approach to care in the setting of end-stage illness, and is about supporting the patient where they are allowing nature to take it's course. Discussed the hospice team includes RNs, physicians, social workers, and chaplains. They can provide personal care, support for the family, and  help keep patient out of the hospital as well as assist with DME needs for home hospice. Education provided on the difference between home vs residential hospice.   Advance directives, concepts specific to code status, and rehospitalization were considered and discussed. Wilber Oliphant states he is patient's Economist and can email document - email provided. Wilber Oliphant confirms DNR/DNI status. He also confirms wishes for no heroic measures or invasive procedures. He would like to continue current medical management.  Discussed with patient/family the importance of continued conversation with each other and the medical providers regarding overall plan of care and treatment options, ensuring decisions are within the context of the patient's values and GOCs.    Questions and concerns were addressed. The patient/family was encouraged to call with questions and/or concerns. PMT  number was provided.  Discussed starting xanax for patient's anxiety with Dr. Candiss Norse.    Primary Decision Maker: HCPOA/son/Carlton Drone    SUMMARY OF RECOMMENDATIONS   Continue current medical management - no heroic measures or invasive procedures. If patient declines, family open for transition to comfort care Continue DNR/DNI - durable DNR form completed and placed in shadow chart. Copy was made and will be scanned into Vynca/ACP tab Son's goal is for patient to receive full course of recommended antibiotic treatment as he was told this could possibly help the patient's pain  Son deciding on discharge back to rehab vs LTC - he prefers LTC as he does not feel patient is getting benefit from rehab. This decision will pend on insurance/OOP cost TOC consulted for: son's request to discuss insurance / rehab options vs LTC OOP cost Xanax 0.$RemoveBefo'5mg'XofqNETfFxk$  TID PRN anxiety PMT will continue to follow and support holistically   Code Status/Advance Care Planning: DNR  Palliative Prophylaxis:  Aspiration, Bowel Regimen, Delirium Protocol, Frequent Pain Assessment, Oral Care, and Turn Reposition  Additional Recommendations (Limitations, Scope, Preferences): Avoid Hospitalization, No Artificial Feeding, No Surgical Procedures, and No Tracheostomy  Psycho-social/Spiritual:  Desire for further Chaplaincy support:no Created space and opportunity for patient and family to express thoughts and feelings regarding patient's current medical situation.  Emotional support and therapeutic listening provided.  Prognosis:   Poor in the setting of advanced age, Parkinson's disease, and multiple comorbidities  Discharge Planning: To Be Determined      Primary Diagnoses: Present on Admission:  Decubitus ulcer of ischium, left, stage IV (HCC)  Essential hypertension  OSA (obstructive sleep apnea)  Permanent atrial fibrillation ([place]): CHA2DS2-VASc Score 5; on Warfarin  Acute on chronic diastolic CHF (congestive  heart failure) ([place])   I have reviewed the medical record, interviewed the patient and family, and examined the patient. The following aspects are pertinent.  Past Medical History:  Diagnosis Date   CHF (congestive heart failure) (HCC)    Chronic /permanent atrial fibrillation ([place]) 2005   On Warfarin (follwed @ [facility]); Rate controlled - On BB   Chronic diastolic heart failure, NYHA class 2 (HCC)    Updated by A. fib, hypertensive heart disease and obesity; EDP was only 7 by cardiac catheterization in September 2017   Diabetic lumbosacral plexopathy (HCC)    Fibromyalgia    Generalized anxiety disorder    Hypokalemia 09/03/2017   Incontinence    Major depressive disorder    OSA on CPAP    Parkinson's disease ([place])    Restless leg syndrome    Syncope 09/03/2017   Type II diabetes mellitus ([place])    Social History   Socioeconomic History   Marital status: Married  Spouse name: Not on file   Number of children: Not on file   Years of education: Not on file   Highest education level: Not on file  Occupational History   Not on file  Tobacco Use   Smoking status: Never   Smokeless tobacco: Never  Vaping Use   Vaping Use: Never used  Substance and Sexual Activity   Alcohol use: No   Drug use: No   Sexual activity: Yes  Other Topics Concern   Not on file  Social History Narrative   Not on file   Social Determinants of Health   Financial Resource Strain: Not on file  Food Insecurity: Not on file  Transportation Needs: Not on file  Physical Activity: Not on file  Stress: Not on file  Social Connections: Not on file   Family History  Problem Relation Age of Onset   Hypertension Mother    Heart disease Mother    Leukemia Son    Scheduled Meds:  (feeding supplement) PROSource Plus  30 mL Oral TID BM   amantadine  100 mg Oral Daily   vitamin C  500 mg Oral Daily   carbamazepine  100 mg Oral BID   carbidopa-levodopa  1 tablet Oral TID  WC   Chlorhexidine Gluconate Cloth  6 each Topical Daily   ferrous sulfate  325 mg Oral BID WC   furosemide  40 mg Intravenous Daily   insulin aspart  0-9 Units Subcutaneous TID WC   insulin glargine-yfgn  25 Units Subcutaneous QHS   losartan  12.5 mg Oral Daily   magnesium oxide  400 mg Oral BID   multivitamin with minerals  1 tablet Oral Daily   pantoprazole  40 mg Oral Daily   polyethylene glycol  17 g Oral Daily   propranolol  20 mg Oral TID   spironolactone  50 mg Oral Daily   vitamin B-12  500 mcg Oral Daily   warfarin  4 mg Oral ONCE-1600   Warfarin - Pharmacist Dosing Inpatient   Does not apply q1600   Continuous Infusions:  ampicillin-sulbactam (UNASYN) IV 3 g (11/19/20 0401)   vancomycin Stopped (11/19/20 0755)   PRN Meds:.acetaminophen **OR** acetaminophen, ondansetron (ZOFRAN) IV, traMADol Medications Prior to Admission:  Prior to Admission medications   Medication Sig Start Date End Date Taking? Authorizing Provider  acetaminophen (TYLENOL) 500 MG tablet Take 500-1,000 mg by mouth See admin instructions. Take 1000mg  by mouth twice daily.  May take 500mg  every 6 hours as needed for pain.  Do not exceed 4000mg  in 24 hours.   Yes [provider]  amantadine (SYMMETREL) 100 MG capsule Take 100 mg by mouth daily.   Yes [provider]  buPROPion (WELLBUTRIN XL) 150 MG 24 hr tablet Take 150 mg by mouth daily.   Yes [provider]  carbamazepine (TEGRETOL XR) 100 MG 12 hr tablet Take 100 mg by mouth 2 (two) times daily.   Yes [provider]  carbidopa-levodopa (SINEMET IR) 10-100 MG tablet Take 1 tablet by mouth 3 (three) times daily.   Yes [provider]  cholecalciferol (VITAMIN D3) 25 MCG (1000 UNIT) tablet Take 1,000 Units by mouth daily.   Yes [provider]  Cyanocobalamin (VITAMIN B12) 500 MCG TABS Take 500 mcg by mouth daily.   Yes [provider]  Dulaglutide 1.5 MG/0.5ML SOPN Inject 1.5 mg into the skin  every Monday.   Yes [provider]  furosemide (LASIX) 20 MG tablet Take 1 tablet (  20 mg total) by mouth daily. Patient taking differently: Take 60 mg by mouth daily. 12/15/19  Yes Lendon Colonel, NP  gabapentin (NEURONTIN) 300 MG capsule Take 300 mg by mouth 3 (three) times daily.   Yes [provider]  Insulin Glargine (BASAGLAR KWIKPEN) 100 UNIT/ML SOPN Inject 0.25 mLs (25 Units total) into the skin at bedtime. Patient taking differently: Inject 13 Units into the skin at bedtime. 04/07/19  Yes Johnson, Clanford L, MD  insulin lispro (HUMALOG) 100 UNIT/ML injection Inject 0-8 Units into the skin See admin instructions. Three times daily per sliding scale: 0-200 0 units 201-250 2 units 251-300 4 units 301-350 6 units 351-400 8 units Greater than 400 call MD   Yes [provider]  iron polysaccharides (NIFEREX) 150 MG capsule Take 150 mg by mouth daily.   Yes [provider]  lidocaine (LIDODERM) 5 % Place 1 patch onto the skin daily. Remove & Discard patch within 12 hours or as directed by MD   Yes [provider]  losartan (COZAAR) 25 MG tablet Take 25 mg by mouth daily.   Yes [provider]  magnesium oxide (MAG-OX) 400 MG tablet Take 1 tablet (400 mg total) by mouth 3 (three) times daily. ALSO TAKE A EXTRA 400 MG TABLET ON THE DAYS YOU TAKE ZAROXOLYN TABLET Patient taking differently: Take 400 mg by mouth 2 (two) times daily. 02/02/18  Yes Leonie Man, MD  melatonin 3 MG TABS tablet Take 6 mg by mouth at bedtime.   Yes [provider]  Menthol-Zinc Oxide 0.44-20.625 % OINT Apply 1 application topically daily as needed (irritation on the buttock).   Yes [provider]  oxyCODONE (OXY IR/ROXICODONE) 5 MG immediate release tablet Take 1 tablet (5 mg total) by mouth every 6 (six) hours as needed for severe pain. Patient taking differently: Take 5 mg by mouth every 12 (twelve) hours as needed for severe pain. 04/07/19   Yes Johnson, Clanford L, MD  polyethylene glycol (MIRALAX / GLYCOLAX) 17 g packet Take 17 g by mouth daily.   Yes [provider]  propranolol (INDERAL) 40 MG tablet Take 40 mg by mouth 3 (three) times daily.   Yes [provider]  sennosides-docusate sodium (SENOKOT-S) 8.6-50 MG tablet Take 2 tablets by mouth daily. Take with prune juice.   Yes [provider]  vitamin C (ASCORBIC ACID) 500 MG tablet Take 500 mg by mouth daily.   Yes [provider]  warfarin (COUMADIN) 5 MG tablet Take 5 mg by mouth daily. (Take with $RemoveBe'6mg'obLzfSDyE$  tablet to equal a total dose of $Remov'11mg'RGbvQC$ .)   Yes [provider]  warfarin (COUMADIN) 6 MG tablet Take 6 mg by mouth daily. (Take with $RemoveBe'5mg'dRmcdiEqY$  tablet to equal a total dose of $Remov'11mg'sguCAt$ .)   Yes [provider]   Allergies  Allergen Reactions   Bee Venom Hives    Takes benadryl   Other Other (See Comments)    Raw foods with seeds give her "boils".  Avoids raw strawberries, blueberries. Tolerates cooked fruits, tomato sauce, bread/grains with seeds. [facility] 10/17/13 Berries with seeds Allergy to glutamine-C-quercet-selen-brom per [facility] 09/25/17   Zosyn [Piperacillin Sod-Tazobactam So] Rash    Itchy total body rash.   Alteplase Rash   Cefepime Rash   Fentanyl Other (See Comments)    Hallucinations, syncope     Metformin And Related Diarrhea   Vancomycin Rash   Review of Systems  Unable to perform ROS: Mental status change   Physical  Exam Vitals and nursing note reviewed.  Constitutional:      General: She is not in acute distress.    Appearance: She is ill-appearing.  Pulmonary:     Effort: No respiratory distress.  Skin:    General: Skin is warm and dry.  Neurological:     Mental Status: She is alert. She is disoriented and confused.     Motor: Weakness present.  Psychiatric:        Attention and Perception: She is inattentive.        Mood and Affect: Mood is anxious.        Cognition and Memory: Cognition is impaired.  Memory is impaired.    Vital Signs: BP (!) 138/95 (BP Location: Left Wrist)   Pulse 92   Temp 97.8 F (36.6 C) (Oral)   Resp (!) 26   Ht $R'5\' 10"'Yv$  (1.778 m)   Wt 114.8 kg   SpO2 94%   BMI 36.30 kg/m  Pain Scale: PAINAD   Pain Score: 8    SpO2: SpO2: 94 % O2 Device:SpO2: 94 % O2 Flow Rate: .O2 Flow Rate (L/min): 3 L/min  IO: Intake/output summary:  Intake/Output Summary (Last 24 hours) at 11/19/2020 1301 Last data filed at 11/19/2020 0820 Gross per 24 hour  Intake 929.97 ml  Output 2220 ml  Net -1290.03 ml    LBM: Last BM Date: 11/18/20 Baseline Weight: Weight: 104 kg Most recent weight: Weight: 114.8 kg     Palliative Assessment/Data: PPS 30%     Time In: 1315 Time Out: 1430 Time Total: 75 minutes  Greater than 50%  of this time was spent counseling and coordinating care related to the above assessment and plan.  Signed by: Lin Landsman, NP   Please contact Palliative Medicine Team phone at [phone] for questions and concerns.  For individual provider: See Shea Evans

## 2020-11-19 NOTE — Progress Notes (Signed)
PHARMACY CONSULT NOTE FOR:  OUTPATIENT  PARENTERAL ANTIBIOTIC THERAPY (OPAT)  Indication: MRSA Bacteremia/Wound Infection Regimen:  Vancomycin 1750 mg IV q24h Augmentin 875 mg PO BID  End date: Vancomycin stop 10/13, Augmentin stop 9/28  IV antibiotic discharge orders are pended. To discharging provider:  please sign these orders via discharge navigator,  Select New Orders & click on the button choice - Manage This Unsigned Work.     Thank you for allowing pharmacy to be a part of this patient's care.  Lestine Box, PharmD PGY2 Infectious Diseases Pharmacy Resident   Please check [email or web address] for unit-specific pharmacy phone numbers

## 2020-11-19 NOTE — Progress Notes (Signed)
[facility] for Coumadin Indication: atrial fibrillation  Allergies  Allergen Reactions   Bee Venom Hives    Takes benadryl   Other Other (See Comments)    Raw foods with seeds give her "boils".  Avoids raw strawberries, blueberries. Tolerates cooked fruits, tomato sauce, bread/grains with seeds. [facility] 10/17/13 Berries with seeds Allergy to glutamine-C-quercet-selen-brom per [facility] 09/25/17   Zosyn [Piperacillin Sod-Tazobactam So] Rash    Itchy total body rash.   Alteplase Rash   Cefepime Rash   Fentanyl Other (See Comments)    Hallucinations, syncope     Metformin And Related Diarrhea   Vancomycin Rash    Patient Measurements: Height: '5\' 10"'$  (177.8 cm) Weight: 114.8 kg (253 lb) IBW/kg (Calculated) : 68.5  Vital Signs: Temp: 97.8 F (36.6 C) (09/19 0800) Temp Source: Axillary (09/19 0800) BP: 110/69 (09/19 0800) Pulse Rate: 93 (09/19 0800)  Labs: Recent Labs    11/17/20 0452 11/18/20 0315 11/19/20 0404  HGB 9.7* 10.1* 9.5*  HCT 34.0* 35.2* 33.3*  PLT 103* 158 169  LABPROT 30.9* 32.8* 26.3*  INR 3.0* 3.2* 2.4*  CREATININE 0.50 0.55 0.45     Estimated Creatinine Clearance: 91.2 mL/min (by C-G formula based on SCr of 0.45 mg/dL).  Assessment: 70 y.o. female admitted with CHF.  Pharmacy consulted to continue Coumadin for history of Afib.  Spoke to [place] staff, patient's Coumadin dose was increased on 9/12 to '11mg'$  PO daily PTA.  INR was supratherapeutic at 3.0 few days ago, had warfarin dose reduced to 7.'5mg'$  > now '4mg'$  given yesterday. INR 2.4 today.  Patient remains on carbamazepine. Hemoglobin and platelet trending up; no bleeding reported.  Last dose of Levaquin 9/13, likely not affecting INR.   Goal of Therapy:  INR 2-3 Monitor platelets by anticoagulation protocol: Yes   Plan:  Will give it one more day, '4mg'$  x1 dose Daily PT / INR  Thank you for allowing pharmacy to participate in this patient's care.  Joetta Manners, PharmD, [facility] Emergency Medicine Clinical Pharmacist ED RPh Phone: [place]: [phone]

## 2020-11-19 NOTE — Progress Notes (Signed)
Peripherally Inserted Central Catheter Placement  The IV Nurse has discussed with the patient and/or persons authorized to consent for the patient, the purpose of this procedure and the potential benefits and risks involved with this procedure.  The benefits include less needle sticks, lab draws from the catheter, and the patient may be discharged home with the catheter. Risks include, but not limited to, infection, bleeding, blood clot (thrombus formation), and puncture of an artery; nerve damage and irregular heartbeat and possibility to perform a PICC exchange if needed/ordered by physician.  Alternatives to this procedure were also discussed.  Bard Power PICC patient education guide, fact sheet on infection prevention and patient information card has been provided to patient /or left at bedside.    PICC Placement Documentation  PICC Single Lumen [id number] Right Basilic 39 cm 0 cm (Active)  Indication for Insertion or Continuance of Line Prolonged intravenous therapies 11/19/20 1155  Exposed Catheter (cm) 0 cm 11/19/20 1155  Site Assessment Clean;Dry;Intact 11/19/20 1155  Line Status Flushed;Blood return noted;Saline locked 11/19/20 1155  Dressing Type Transparent 11/19/20 1155  Dressing Status Clean;Dry;Intact 11/19/20 1155  Antimicrobial disc in place? Yes 11/19/20 1155  Dressing Change Due 11/26/20 11/19/20 1155       Tawonda Legaspi Ramos 11/19/2020, 12:01 PM

## 2020-11-19 NOTE — Progress Notes (Signed)
Physical Therapy Treatment Patient Details Name: Donna [name] MRN: [id number] DOB: 09-18-1950 Today's Date: 11/19/2020   History of Present Illness Pt is a 70 y.o. female admitted from SNF on 11/12/20 with increased edema, SOB, confusion. Workup for acute hypoxic respiratory failure secondary to CHF, UTI. PMH includes Parkinson's disease, sacral ulcers, CHF, afib, DM2, OSA, anemia.   PT Comments    Pt remains limited with mobility. Today's session focused on bed mobility, pt requiring totalA+2 for turning and repositioning to encourage pressure relief. Difficult to progress pt OOB, will require maximove lift for transfers, but deferred sitting in recliner secondary to Stage IV sacral ulcers. Continue to recommend return to SNF; pt will likely require long-term care. Noted family having discussions with Palliatve Medicine and SW regarding d/c plans.    Recommendations for follow up therapy are one component of a multi-disciplinary discharge planning process, led by the attending physician.  Recommendations may be updated based on patient status, additional functional criteria and insurance authorization.  Follow Up Recommendations  SNF;Supervision for mobility/OOB (vs. LTC/ALF)     Equipment Recommendations  None recommended by PT    Recommendations for Other Services       Precautions / Restrictions Precautions Precautions: Fall;Other (comment) Precaution Comments: Multiple sacral ulcers Restrictions Weight Bearing Restrictions: No     Mobility  Bed Mobility Overal bed mobility: Needs Assistance Bed Mobility: Rolling Rolling: Total assist;+2 for physical assistance         General bed mobility comments: Pt attempting to assist with single UE support, ultimately unable to maintain UE support on rail; totalA+2 for rolling R/L for repositioning; pt left partially turned onto L-side for pressure relief to sacrum; very stiff movement, pt anxious    Transfers                     Ambulation/Gait                 Stairs             Wheelchair Mobility    Modified Rankin (Stroke Patients Only)       Balance                                            Cognition Arousal/Alertness: Awake/alert Behavior During Therapy: Anxious Overall Cognitive Status: No family/caregiver present to determine baseline cognitive functioning                                 General Comments: Pt appears anxious related to mobility, responds well to encouragement. Increased time to get words/thoughts out, at times requiring cues to finish sentence; following some simple commands, increased time. Able to answer majority of questions appropriately, increased time      Exercises Other Exercises Other Exercises: Tolerated BLE knee/hip flex/ext PROM within limited range, pt limited by painful contractures/stiffness    General Comments General comments (skin integrity, edema, etc.): Pt tolerated for L partial sidelying; pillows and pads placed for R-side sacral and scapular pressure relief, to encourage bilateral hip neutral rotation, prevalon boots readjusted for heel pressure relief; pt left in modified chair position in chair. Deferred transfer to recliner with maximove secondary to stage iv sacral ulcers      Pertinent Vitals/Pain Pain Assessment: Faces Faces Pain Scale: Hurts little more Pain Location: "  backside" Pain Descriptors / Indicators: Discomfort;Moaning;Grimacing Pain Intervention(s): Monitored during session;Limited activity within patient's tolerance;Repositioned    Home Living                      Prior Function            PT Goals (current goals can now be found in the care plan section) Progress towards PT goals: Not progressing toward goals - comment (limited by anxiety, pain, fatigue; difficult to progress mobility)    Frequency    Min 2X/week      PT Plan Current plan remains  appropriate    Co-evaluation              AM-PAC PT "6 Clicks" Mobility   Outcome Measure  Help needed turning from your back to your side while in a flat bed without using bedrails?: Total Help needed moving from lying on your back to sitting on the side of a flat bed without using bedrails?: Total Help needed moving to and from a bed to a chair (including a wheelchair)?: Total Help needed standing up from a chair using your arms (e.g., wheelchair or bedside chair)?: Total Help needed to walk in hospital room?: Total Help needed climbing 3-5 steps with a railing? : Total 6 Click Score: 6    End of Session Equipment Utilized During Treatment: Oxygen Activity Tolerance: Patient limited by fatigue;Patient limited by pain Patient left: in bed;with call bell/phone within reach Nurse Communication: Mobility status PT Visit Diagnosis: Other abnormalities of gait and mobility (R26.89);Muscle weakness (generalized) (M62.81)     Time: [id number] PT Time Calculation (min) (ACUTE ONLY): 22 min  Charges:  $Therapeutic Activity: 8-22 mins                     Mabeline Caras, PT, DPT Acute Rehabilitation Services  Pager [phone] Office [place] 11/19/2020, 5:32 PM

## 2020-11-19 NOTE — Progress Notes (Signed)
       [facility]  Date of Admission:  11/12/2020      Lines: 9/19-c rue picc  Abx: 9/14-c amp/sulb 9/14-c vanc  ASSESSMENT: Mrsa bacteremia; tte negative; quick defervescence and low burden defer tee Infected sacral ulcer stage 4 Left knee pain not clinically appearing septic arthritis at this point Hx right knee replacement without clinical evidence mrsa involvement at this time   Would finish tx 2 weeks sacral ulcer soft tissue infection Finish 4 weeks iv abx mrsa bsi with vancomycin  9/12 bcx positive 9/15 bcx negative  F/u in clinic and determine if chronic suppression due to right knee prosthesis  PLAN: Finish 4 weeks vanc from 9/15-10/13 Finish 2 weeks unasyn (can transition to augmentin oral) on 9/28 F/u id clinic as below Discussed with primary team Id will sign off, please call if further question       OPAT Orders Discharge antibiotics to be given via PICC line Aim for Vancomycin trough 10-20 on the lower side of the range or AUC 400-550 (unless otherwise indicated)   PIC Care Per Protocol:  Home health RN for IV administration and teaching; PICC line care and labs.    Labs weekly while on IV antibiotics: _x_ CBC with differential __ BMP _x_ CMP _x_ CRP __ ESR __ Vancomycin trough __ CK  _x_ Please pull PIC at completion of IV antibiotics __ Please leave PIC in place until doctor has seen patient or been notified  Fax weekly labs to [phone]  Clinic Follow Up Appt: 10/07 @ 9am with Dr   @  [facility] [address], [place],  [address] Phone: [phone]   I spent more than 35 minute reviewing data/chart, and coordinating care and >50% direct face to face time providing counseling/discussing diagnostics/treatment plan with patient   Principal Problem:   MRSA bacteremia Active Problems:   Permanent atrial fibrillation (HCC): CHA2DS2-VASc Score 5; on Warfarin   OSA  (obstructive sleep apnea)   Essential hypertension   Type 2 diabetes mellitus (HCC)   Decubitus ulcer of ischium, left, stage IV (HCC)   Acute on chronic diastolic CHF (congestive heart failure) (HCC)   Acute CHF (congestive heart failure) (HCC)   Acute pain of left knee   Allergies  Allergen Reactions   Bee Venom Hives    Takes benadryl   Other Other (See Comments)    Raw foods with seeds give her "boils".  Avoids raw strawberries, blueberries. Tolerates cooked fruits, tomato sauce, bread/grains with seeds. SAH 10/17/13 Berries with seeds Allergy to glutamine-C-quercet-selen-brom per MAR 09/25/17   Zosyn [Piperacillin Sod-Tazobactam So] Rash    Itchy total body rash.   Alteplase Rash   Cefepime Rash   Fentanyl Other (See Comments)    Hallucinations, syncope     Metformin And Related Diarrhea   Vancomycin Rash    Scheduled Meds:  (feeding supplement) PROSource Plus  30 mL Oral TID BM   amantadine  100 mg Oral Daily   vitamin C  500 mg Oral Daily   carbamazepine  100 mg Oral BID   carbidopa-levodopa  1 tablet Oral TID WC   Chlorhexidine Gluconate Cloth  6 each Topical Daily   ferrous sulfate  325 mg Oral BID WC   furosemide  40 mg Intravenous Daily   insulin aspart  0-9 Units Subcutaneous TID WC   insulin glargine-yfgn  25 Units Subcutaneous QHS   losartan  12.5 mg Oral Daily     magnesium oxide  400 mg Oral BID   multivitamin with minerals  1 tablet Oral Daily   pantoprazole  40 mg Oral Daily   polyethylene glycol  17 g Oral Daily   propranolol  20 mg Oral TID   sodium chloride flush  10-40 mL Intracatheter Q12H   spironolactone  50 mg Oral Daily   vitamin B-12  500 mcg Oral Daily   warfarin  4 mg Oral ONCE-1600   Warfarin - Pharmacist Dosing Inpatient   Does not apply q1600   Continuous Infusions:  ampicillin-sulbactam (UNASYN) IV 3 g (11/19/20 1314)   vancomycin Stopped (11/19/20 0755)   PRN Meds:.acetaminophen **OR** acetaminophen, ondansetron (ZOFRAN) IV, sodium  chloride flush, traMADol   SUBJECTIVE: Less pain left knee No f/c No n/v/diarrhea   Review of Systems: ROS All other ROS was negative, except mentioned above     OBJECTIVE: Vitals:   11/19/20 0800 11/19/20 1017 11/19/20 1057 11/19/20 1245  BP: 110/69   (!) 138/95  Pulse: 93   92  Resp: 19 (!) 25 17 19  Temp: 97.8 F (36.6 C)   97.8 F (36.6 C)  TempSrc: Axillary   Oral  SpO2: 94% 99% 100% 94%  Weight:      Height:       Body mass index is 36.3 kg/m.  Physical Exam General/constitutional: no distress, pleasant HEENT: Normocephalic, PER, Conj Clear, EOMI, Oropharynx clear Neck supple CV: rrr no mrg Lungs: clear to auscultation, normal respiratory effort Abd: Soft, Nontender Ext: no edema Skin: sacral ulcer not examined today Neuro: nonfocal MSK: left knee less tender; relatively intact active rom although performed slowly; improved tenderness per patient  Lab Results Lab Results  Component Value Date   WBC 7.0 11/19/2020   HGB 9.5 (L) 11/19/2020   HCT 33.3 (L) 11/19/2020   MCV 89.3 11/19/2020   PLT 169 11/19/2020    Lab Results  Component Value Date   CREATININE 0.45 11/19/2020   BUN 15 11/19/2020   NA 140 11/19/2020   K 3.6 11/19/2020   CL 90 (L) 11/19/2020   CO2 37 (H) 11/19/2020    Lab Results  Component Value Date   ALT 11 11/19/2020   AST 17 11/19/2020   ALKPHOS 86 11/19/2020   BILITOT 0.9 11/19/2020      Microbiology: Recent Results (from the past 240 hour(s))  Resp Panel by RT-PCR (Flu A&B, Covid) Nasopharyngeal Swab     Status: None   Collection Time: 11/12/20  2:33 PM   Specimen: Nasopharyngeal Swab; Nasopharyngeal(NP) swabs in vial transport medium  Result Value Ref Range Status   SARS Coronavirus 2 by RT PCR NEGATIVE NEGATIVE Final    Comment: (NOTE) SARS-CoV-2 target nucleic acids are NOT DETECTED.  The SARS-CoV-2 RNA is generally detectable in upper respiratory specimens during the acute phase of infection. The  lowest concentration of SARS-CoV-2 viral copies this assay can detect is 138 copies/mL. A negative result does not preclude SARS-Cov-2 infection and should not be used as the sole basis for treatment or other patient management decisions. A negative result may occur with  improper specimen collection/handling, submission of specimen other than nasopharyngeal swab, presence of viral mutation(s) within the areas targeted by this assay, and inadequate number of viral copies(<138 copies/mL). A negative result must be combined with clinical observations, patient history, and epidemiological information. The expected result is Negative.  Fact Sheet for Patients:  https://www.fda.gov/media/152166/download  Fact Sheet for Healthcare Providers:  https://www.fda.gov/media/152162/download  This test is no t yet   approved or cleared by the [place] and  has been authorized for detection and/or diagnosis of SARS-CoV-2 by FDA under an Emergency Use Authorization (EUA). This EUA will remain  in effect (meaning this test can be used) for the duration of the COVID-19 declaration under Section 564(b)(1) of the Act, 21 U.S.C.section 360bbb-3(b)(1), unless the authorization is terminated  or revoked sooner.       Influenza A by PCR NEGATIVE NEGATIVE Final   Influenza B by PCR NEGATIVE NEGATIVE Final    Comment: (NOTE) The Xpert Xpress SARS-CoV-2/FLU/RSV plus assay is intended as an aid in the diagnosis of influenza from Nasopharyngeal swab specimens and should not be used as a sole basis for treatment. Nasal washings and aspirates are unacceptable for Xpert Xpress SARS-CoV-2/FLU/RSV testing.  Fact Sheet for Patients: EntrepreneurPulse.com.au  Fact Sheet for Healthcare Providers: IncredibleEmployment.be  This test is not yet approved or cleared by the [place] FDA and has been authorized for detection and/or diagnosis of SARS-CoV-2 by FDA under  an Emergency Use Authorization (EUA). This EUA will remain in effect (meaning this test can be used) for the duration of the COVID-19 declaration under Section 564(b)(1) of the Act, 21 U.S.C. section 360bbb-3(b)(1), unless the authorization is terminated or revoked.  Performed at [facility], [place] [address] [place], [place] [address]   Blood Culture (routine x 2)     Status: Abnormal   Collection Time: 11/12/20  3:00 PM   Specimen: BLOOD  Result Value Ref Range Status   Specimen Description BLOOD LEFT ANTECUBITAL  Final   Special Requests   Final    BOTTLES DRAWN AEROBIC AND ANAEROBIC Blood Culture results may not be optimal due to an excessive volume of blood received in culture bottles   Culture  Setup Time   Final    GRAM POSITIVE COCCI IN CLUSTERS ANAEROBIC BOTTLE ONLY CRITICAL RESULT CALLED TO, READ BACK BY AND VERIFIED WITH: PHARMD GREG ABBOTT 11/14/2020 _0  BY JW Performed at [facility], [place] [address], [place], [place] [address]    Culture METHICILLIN RESISTANT STAPHYLOCOCCUS AUREUS (A)  Final   Report Status 11/16/2020 FINAL  Final   Organism ID, Bacteria METHICILLIN RESISTANT STAPHYLOCOCCUS AUREUS  Final      Susceptibility   Methicillin resistant staphylococcus aureus - MIC*    CIPROFLOXACIN >=8 RESISTANT Resistant     ERYTHROMYCIN >=8 RESISTANT Resistant     GENTAMICIN <=0.5 SENSITIVE Sensitive     OXACILLIN >=4 RESISTANT Resistant     TETRACYCLINE <=1 SENSITIVE Sensitive     VANCOMYCIN 1 SENSITIVE Sensitive     TRIMETH/SULFA >=320 RESISTANT Resistant     CLINDAMYCIN <=0.25 SENSITIVE Sensitive     RIFAMPIN <=0.5 SENSITIVE Sensitive     Inducible Clindamycin NEGATIVE Sensitive     * METHICILLIN RESISTANT STAPHYLOCOCCUS AUREUS  Urine Culture     Status: Abnormal   Collection Time: 11/12/20  3:00 PM   Specimen: In/Out Cath Urine  Result Value Ref Range Status   Specimen Description IN/OUT CATH URINE  Final   Special Requests Normal  Final    Culture (A)  Final    >=100,000 COLONIES/mL PROVIDENCIA STUARTII 60,000 COLONIES/mL AEROCOCCUS SPECIES Standardized susceptibility testing for this organism is not available. Performed at [facility], [place] [address], [place], [place] [address]    Report Status 11/15/2020 FINAL  Final   Organism ID, Bacteria PROVIDENCIA STUARTII (A)  Final      Susceptibility   Providencia stuartii -  MIC*    AMPICILLIN RESISTANT Resistant     CEFAZOLIN >=64 RESISTANT Resistant     CEFEPIME <=0.12 SENSITIVE Sensitive     CEFTRIAXONE <=0.25 SENSITIVE Sensitive     CIPROFLOXACIN >=4 RESISTANT Resistant     GENTAMICIN RESISTANT Resistant     IMIPENEM 2 SENSITIVE Sensitive     NITROFURANTOIN 256 RESISTANT Resistant     TRIMETH/SULFA <=20 SENSITIVE Sensitive     AMPICILLIN/SULBACTAM 4 SENSITIVE Sensitive     PIP/TAZO <=4 SENSITIVE Sensitive     * >=100,000 COLONIES/mL PROVIDENCIA STUARTII  Blood Culture ID Panel (Reflexed)     Status: Abnormal   Collection Time: 11/12/20  3:00 PM  Result Value Ref Range Status   Enterococcus faecalis NOT DETECTED NOT DETECTED Final   Enterococcus Faecium NOT DETECTED NOT DETECTED Final   Listeria monocytogenes NOT DETECTED NOT DETECTED Final   Staphylococcus species DETECTED (A) NOT DETECTED Final    Comment: CRITICAL RESULT CALLED TO, READ BACK BY AND VERIFIED WITH: PHARMD GREG ABBOTT 11/14/2020 _0  BY JW    Staphylococcus aureus (BCID) DETECTED (A) NOT DETECTED Final    Comment: Methicillin (oxacillin)-resistant Staphylococcus aureus (MRSA). MRSA is predictably resistant to beta-lactam antibiotics (except ceftaroline). Preferred therapy is vancomycin unless clinically contraindicated. Patient requires contact precautions if  hospitalized. CRITICAL RESULT CALLED TO, READ BACK BY AND VERIFIED WITH: PHARMD GREG ABBOTT 11/14/2020 _1  BY JW    Staphylococcus epidermidis NOT DETECTED NOT DETECTED Final   Staphylococcus lugdunensis NOT DETECTED NOT DETECTED Final    Streptococcus species NOT DETECTED NOT DETECTED Final   Streptococcus agalactiae NOT DETECTED NOT DETECTED Final   Streptococcus pneumoniae NOT DETECTED NOT DETECTED Final   Streptococcus pyogenes NOT DETECTED NOT DETECTED Final   A.calcoaceticus-baumannii NOT DETECTED NOT DETECTED Final   Bacteroides fragilis NOT DETECTED NOT DETECTED Final   Enterobacterales NOT DETECTED NOT DETECTED Final   Enterobacter cloacae complex NOT DETECTED NOT DETECTED Final   Escherichia coli NOT DETECTED NOT DETECTED Final   Klebsiella aerogenes NOT DETECTED NOT DETECTED Final   Klebsiella oxytoca NOT DETECTED NOT DETECTED Final   Klebsiella pneumoniae NOT DETECTED NOT DETECTED Final   Proteus species NOT DETECTED NOT DETECTED Final   Salmonella species NOT DETECTED NOT DETECTED Final   Serratia marcescens NOT DETECTED NOT DETECTED Final   Haemophilus influenzae NOT DETECTED NOT DETECTED Final   Neisseria meningitidis NOT DETECTED NOT DETECTED Final   Pseudomonas aeruginosa NOT DETECTED NOT DETECTED Final   Stenotrophomonas maltophilia NOT DETECTED NOT DETECTED Final   Candida albicans NOT DETECTED NOT DETECTED Final   Candida auris NOT DETECTED NOT DETECTED Final   Candida glabrata NOT DETECTED NOT DETECTED Final   Candida krusei NOT DETECTED NOT DETECTED Final   Candida parapsilosis NOT DETECTED NOT DETECTED Final   Candida tropicalis NOT DETECTED NOT DETECTED Final   Cryptococcus neoformans/gattii NOT DETECTED NOT DETECTED Final   Meth resistant mecA/C and MREJ DETECTED (A) NOT DETECTED Final    Comment: CRITICAL RESULT CALLED TO, READ BACK BY AND VERIFIED WITH: PHARMD GREG ABBOTT 11/14/2020 _2  BY JW Performed at [facility], [address] [place], [place] [address]   Blood Culture (routine x 2)     Status: None   Collection Time: 11/12/20  3:21 PM   Specimen: BLOOD RIGHT HAND  Result Value Ref Range Status   Specimen Description BLOOD RIGHT HAND  Final   Special Requests   Final     BOTTLES DRAWN AEROBIC AND ANAEROBIC Blood Culture results may not  be optimal due to an inadequate volume of blood received in culture bottles   Culture   Final    NO GROWTH 5 DAYS Performed at [facility], [place] [address], [place], [place] [address]    Report Status 11/17/2020 FINAL  Final  Culture, blood (routine x 2)     Status: None (Preliminary result)   Collection Time: 11/15/20  9:12 AM   Specimen: BLOOD RIGHT HAND  Result Value Ref Range Status   Specimen Description BLOOD RIGHT HAND  Final   Special Requests   Final    BOTTLES DRAWN AEROBIC AND ANAEROBIC Blood Culture adequate volume   Culture   Final    NO GROWTH 4 DAYS Performed at [facility], [place] [address] [place], [place] [address]    Report Status PENDING  Incomplete  Culture, blood (routine x 2)     Status: None (Preliminary result)   Collection Time: 11/15/20  9:26 AM   Specimen: BLOOD LEFT HAND  Result Value Ref Range Status   Specimen Description BLOOD LEFT HAND  Final   Special Requests   Final    BOTTLES DRAWN AEROBIC AND ANAEROBIC Blood Culture adequate volume   Culture   Final    NO GROWTH 4 DAYS Performed at [facility], [place] [address] [place], [place] [address]    Report Status PENDING  Incomplete     Serology:   Imaging: If present, new imagings (plain films, ct scans, and mri) have been personally visualized and interpreted; radiology reports have been reviewed. Decision making incorporated into the Impression / Recommendations.  9/16 right knee xray 1. Medial unicompartmental arthroplasty of the right knee without lucency along the margins to suggest prosthesis loosening or infection. 2. Trace knee effusion. 3. Stable lucency along the distal femoral metaphysis medially potentially with mild surrounding sclerosis, no change from 07/19/2020. This could be some form of chronic postoperative reactive lesion but is technically nonspecific and was not appreciable on the  preoperative radiograph of 05/25/2014. Chronic osteomyelitis is not entirely excluded although is not favored. 4. Suspected distal quadriceps tendinopathy.  9/16 left knee mri 1. Today's exam consists of 3 severely motion degraded sequences. The patient refused completion of the exam and any further MR imaging. This substantially reduces diagnostic sensitivity and specificity. 2. There is a small knee effusion with suspected mild synovitis. The effusion is less than I would typically expect in the setting of septic joint, and there is no overt marrow edema along the articular surfaces to indicate concomitant osteomyelitis. That said, early infection cannot be totally excluded based on the images provided. 3. Ill definition of the roots of the posterior horn medial meniscus and lateral meniscus, cannot exclude radial tears. 4. Severe osteoarthritis especially in the medial compartment. 5. Generalized infiltrative edema in the subcutaneous tissues and popliteal region. 6. About 5.3 cm hematoma along the superficial fascia margin lateral to the proximal fibia, with hemosiderin along the margins suggesting some chronicity. 7. Mild chronic AVN in the distal femoral metaphysis.  Jabier Mutton, [facility] [place] [phone] pager    11/19/2020, 2:50 PM

## 2020-11-19 NOTE — Progress Notes (Addendum)
PROGRESS NOTE                                                                                                                                                                                                             Patient Demographics:    Donna [name], is a 71 y.o. female, DOB - 1950-11-03, [id number]  Outpatient Primary MD for the patient is O'Buch, Alvis Lemmings, PA-C    LOS - 6  Admit date - 11/12/2020    Chief Complaint  Patient presents with   Altered Mental Status       Brief Narrative (HPI from H&P)   Donna [name] is a 70 y.o. female with history of diastolic CHF last EF measured in 2019 was 55 to 60% with history of A. fib diabetes mellitus type 2, anemia, sleep apnea Parkinson's disease bedbound with sacral ulcers was brought to the ER after patient was found to have increasing peripheral edema with shortness of breath, was dignosed with CHF and admitted to the hospital.   Subjective:   Patient in bed, appears comfortable, denies any headache, no fever, no chest pain or pressure, no shortness of breath , no abdominal pain. No new focal weakness. Has some chronic low back pain and some chronic bilateral knee pain from time to time, she says this is not new.   Assessment  & Plan :     Per son he wishes his mom to be DNR, continue with medical care including antibiotics.  No invasive procedures or heroics.  If further declines full comfort measures.  With involve palliative care.   Acute Hypoxic Resp. Failure due to Acute on chronic diastolic CHF last EF [id number].  Agree with IV Lasix, dose increased, continue Aldactone and beta-blocker, continue supportive care with oxygen and monitor, TTE  stable, symptoms are better, Cards following.  Encouraged the patient to sit up in chair in the daytime use I-S and flutter valve for pulmonary toiletry.  Will advance activity and titrate down oxygen as possible.  SpO2: 94 % O2 Flow  Rate (L/min): 3 L/min  2.  Sacral decubitus ulcers present on admission with 1 out of 4 MRSA bacteremia.  Foley if needed for hygiene, wound care team consulted. 1/4 MRSA +ve BC, ID following on Vanco, TTE -ve, blood cultures are negative for over 3 days, PICC line to  be placed 11/20/2019 thereafter likely discharge to SNF on 11/21/2018.  Also some chronic bilateral knee pain, on exam no effusion or redness, no warmth, there is some tenderness in both knees but patient claims that is chronic, MRI noted and it was inconclusive.  ID following, will defer ABX stop dates to ID.   3.  OSA.  CPAP at night.  4.  Chronic anemia and thrombocytopenia.  Monitor.  5.  UTI.  Cultures noted, Rocephin >> Unasyn per ID .  6.  Underlying Parkinson's.  On combination of Sinemet and amantadine. ? Underlying dementia.  Does have underlying resting tremors.  7.  Hypertension.  Propranolol and ARB combination along with diuretics.  8.  Paroxysmal atrial fibrillation.  Mali vas 2 score of greater than 3.  On combination of propranolol and Coumadin.  Pharmacy monitoring INR.  9.  Obesity.  BMI 32.  Follow-up with PCP for weight loss.  10. Toxic Encephalopathy - likely hospital-acquired delirium along with multiple sedative medications, held Neurontin, Wellbutrin and Zoloft on 11/15/2020, encephalopathy much improved.  Continue to monitor.  11.  Hypokalemia and hypomagnesemia.  Replaced.  12. 11/17/20 short NSVT on tele - asymptomatic, EF > 65%, on B.Blocker, Mag was low - replaced.  13. DM type II.  On Lantus  and sliding scale.  Will monitor and adjust.  Lab Results  Component Value Date   HGBA1C 6.0 (H) 04/03/2019    CBG (last 3)  Recent Labs    11/18/20 1054 11/18/20 1558 11/19/20 0616  GLUCAP 220* 228* 149*   Lab Results  Component Value Date   INR 2.4 (H) 11/19/2020   INR 3.2 (H) 11/18/2020   INR 3.0 (H) 11/17/2020     Recent Labs  Lab 11/12/20 1433 11/12/20 1500 11/12/20 1556  11/13/20 1600 11/14/20 0133 11/14/20 0452 11/15/20 0409 11/16/20 0249 11/17/20 0452 11/18/20 0315 11/19/20 0404  WBC  --   --    < >  --  8.3  --  9.2 6.8 6.5 6.3 7.0  HGB  --   --    < >  --  10.5*  --  9.8* 9.9* 9.7* 10.1* 9.5*  HCT  --   --    < >  --  36.3  --  35.2* 35.4* 34.0* 35.2* 33.3*  PLT  --   --    < >  --  140*  --  145* 112* 103* 158 169  BNP 412.3*  --   --   --  402.6*  --  322.2* 381.4* 374.3*  --   --   PROCALCITON  --   --   --  <0.10  --  <0.10 <0.10  --   --   --   --   AST 20  --   --   --  16  --  '15 16 20 18 17  '$ ALT 10  --   --   --  9  --  '8 9 10 11 11  '$ ALKPHOS 75  --   --   --  80  --  75 77 80 81 86  BILITOT 1.5*  --   --   --  1.3*  --  1.1 1.1 1.0 0.8 0.9  ALBUMIN 2.8*  --   --   --  2.8*  --  2.8* 2.8* 2.8* 2.7* 2.7*  INR 1.8*  --    < >  --  1.8*  --  2.1* 2.4* 3.0* 3.2* 2.4*  LATICACIDVEN  --  1.2  --   --   --   --   --   --   --   --   --   SARSCOV2NAA NEGATIVE  --   --   --   --   --   --   --   --   --   --    < > = values in this interval not displayed.           Condition - Fair  Family Communication  :   Delilah Shan [phone] on 11/16/20, 11/17/20, 11/19/20   Per son he wishes his mom to be DNR, continue with medical care including antibiotics.  No invasive procedures or heroics.  If further declines full comfort measures.  With involve palliative care.    Code Status :  DNR  Consults  :  ID, Pall.Care  PUD Prophylaxis : PPI   Procedures  :      TTE - 1. Left ventricular ejection fraction, by estimation, is 60 to 65%. The left ventricle has normal function. The left ventricle has no regional wall motion abnormalities.  2. Right ventricular systolic function is mildly reduced. The right ventricular size is normal. There is severely elevated pulmonary artery systolic pressure.  3. Left atrial size was moderately dilated.  4. Right atrial size was severely dilated.  5. The mitral valve is degenerative. Trivial mitral valve  regurgitation. No evidence of mitral stenosis.  6. Tricuspid valve regurgitation is moderate.  7. Compared with the echo 08/2017, aortic valve mean gradient has increased frm 9 mmHg to 17 mmHg. The aortic valve is normal in structure. There is moderate calcification of the aortic valve. There is moderate thickening of the aortic valve. Aortic valve regurgitation is not visualized. Mild aortic valve stenosis.  8. The inferior vena cava is dilated in size with <50% respiratory variability, suggesting right atrial pressure of 15 mmHg  CT Head - Non acute      Disposition Plan  :    Status is: Inpatient  Remains inpatient appropriate because:IV treatments appropriate due to intensity of illness or inability to take PO  Dispo: The patient is from: SNF              Anticipated d/c is to: SNF              Patient currently is not medically stable to d/c.   Difficult to place patient No  DVT Prophylaxis  :  Coumadin  Lab Results  Component Value Date   INR 2.4 (H) 11/19/2020   INR 3.2 (H) 11/18/2020   INR 3.0 (H) 11/17/2020    Lab Results  Component Value Date   PLT 169 11/19/2020    Diet :  Diet Order             Diet heart healthy/carb modified Room service appropriate? Yes; Fluid consistency: Thin; Fluid restriction: 1200 mL Fluid  Diet effective now                    Inpatient Medications  Scheduled Meds:  (feeding supplement) PROSource Plus  30 mL Oral TID BM   amantadine  100 mg Oral Daily   vitamin C  500 mg Oral Daily   carbamazepine  100 mg Oral BID   carbidopa-levodopa  1 tablet Oral TID WC   Chlorhexidine Gluconate Cloth  6 each Topical Daily   ferrous sulfate  325 mg Oral BID WC  furosemide  40 mg Intravenous Daily   insulin aspart  0-9 Units Subcutaneous TID WC   insulin glargine-yfgn  25 Units Subcutaneous QHS   losartan  12.5 mg Oral Daily   magnesium oxide  400 mg Oral BID   multivitamin with minerals  1 tablet Oral Daily   pantoprazole  40 mg Oral  Daily   polyethylene glycol  17 g Oral Daily   propranolol  20 mg Oral TID   spironolactone  50 mg Oral Daily   vitamin B-12  500 mcg Oral Daily   Warfarin - Pharmacist Dosing Inpatient   Does not apply q1600   Continuous Infusions:  ampicillin-sulbactam (UNASYN) IV 3 g (11/19/20 0401)   vancomycin Stopped (11/19/20 0755)    PRN Meds:.acetaminophen **OR** acetaminophen, ondansetron (ZOFRAN) IV, traMADol  Antibiotics  :    Anti-infectives (From admission, onward)    Start     Dose/Rate Route Frequency Ordered Stop   11/16/20 0000  vancomycin (VANCOREADY) IVPB 1750 mg/350 mL        1,750 mg 175 mL/hr over 120 Minutes Intravenous Every 24 hours 11/15/20 0846     11/15/20 0500  DAPTOmycin (CUBICIN) 700 mg in sodium chloride 0.9 % IVPB  Status:  Discontinued        8 mg/kg  86.8 kg (Adjusted) 128 mL/hr over 30 Minutes Intravenous Daily 11/14/20 1013 11/14/20 1215   11/14/20 1315  vancomycin (VANCOREADY) IVPB 1250 mg/250 mL  Status:  Discontinued        1,250 mg 166.7 mL/hr over 90 Minutes Intravenous Every 12 hours 11/14/20 1215 11/15/20 0846   11/14/20 1115  Ampicillin-Sulbactam (UNASYN) 3 g in sodium chloride 0.9 % 100 mL IVPB        3 g 200 mL/hr over 30 Minutes Intravenous Every 6 hours 11/14/20 1025     11/14/20 1100  cefTRIAXone (ROCEPHIN) 1 g in sodium chloride 0.9 % 100 mL IVPB  Status:  Discontinued        1 g 200 mL/hr over 30 Minutes Intravenous Every 24 hours 11/14/20 0958 11/14/20 1025   11/14/20 0230  DAPTOmycin (CUBICIN) 850 mg in sodium chloride 0.9 % IVPB        8 mg/kg  104 kg 134 mL/hr over 30 Minutes Intravenous  Once 11/14/20 0130 11/14/20 0501   11/13/20 1500  levofloxacin (LEVAQUIN) IVPB 750 mg  Status:  Discontinued        750 mg 100 mL/hr over 90 Minutes Intravenous Every 24 hours 11/12/20 2204 11/14/20 0951   11/12/20 1500  levofloxacin (LEVAQUIN) IVPB 750 mg        750 mg 100 mL/hr over 90 Minutes Intravenous  Once 11/12/20 1454 11/12/20 1704         Time Spent in minutes  30   Lala Lund M.D on 11/19/2020 at 9:13 AM  To page go to [email or web address]   [facility] -  Office  [phone]  See all Orders from today for further details    Objective:   Vitals:   11/18/20 2000 11/19/20 0000 11/19/20 0400 11/19/20 0800  BP: 113/76 126/89 (!) 119/93 110/69  Pulse:    93  Resp: 19 (!) '24 19 19  '$ Temp: 97.7 F (36.5 C) 98 F (36.7 C) (!) 97.5 F (36.4 C) 97.8 F (36.6 C)  TempSrc: Oral Oral Oral Axillary  SpO2: 96% 96% 94% 94%  Weight:   114.8 kg   Height:        Wt Readings from Last 3 Encounters:  11/19/20 114.8 kg  08/18/19 114.8 kg  05/11/19 114.8 kg     Intake/Output Summary (Last 24 hours) at 11/19/2020 0913 Last data filed at 11/19/2020 [id number] Gross per 24 hour  Intake 1635.89 ml  Output 3300 ml  Net -1664.11 ml     Physical Exam  Awake Alert x 1 - ? baseline, No new F.N deficits, some resting tremors [place].AT,PERRAL Supple Neck,No JVD, No cervical lymphadenopathy appriciated.  Symmetrical Chest wall movement, Good air movement bilaterally, CTAB RRR,No Gallops, Rubs or new Murmurs, No Parasternal Heave +ve B.Sounds, Abd Soft, No tenderness, No organomegaly appriciated, No rebound - guarding or rigidity. No Cyanosis, Clubbing or edema, No new Rash or bruise    RN pressure injury documentation: Pressure Injury 11/12/20 Sacrum Mid Stage 4 - Full thickness tissue loss with exposed bone, tendon or muscle. red and pink bedding, tunneled. (Active)  11/12/20 1555  Location: Sacrum  Location Orientation: Mid  Staging: Stage 4 - Full thickness tissue loss with exposed bone, tendon or muscle.  Wound Description (Comments): red and pink bedding, tunneled.  Present on Admission: Yes     Pressure Injury Hip Right;Lateral Stage 2 -  Partial thickness loss of dermis presenting as a shallow open injury with a red, pink wound bed without slough. (Active)     Location: Hip  Location Orientation: Right;Lateral   Staging: Stage 2 -  Partial thickness loss of dermis presenting as a shallow open injury with a red, pink wound bed without slough.  Wound Description (Comments):   Present on Admission: -- (Not sure,)     Pressure Injury Hip Right;Lateral Stage 2 -  Partial thickness loss of dermis presenting as a shallow open injury with a red, pink wound bed without slough. (Active)     Location: Hip  Location Orientation: Right;Lateral  Staging: Stage 2 -  Partial thickness loss of dermis presenting as a shallow open injury with a red, pink wound bed without slough.  Wound Description (Comments):   Present on Admission: -- (Not sure, I noted it;s not documented)     Data Review:    CBC Recent Labs  Lab 11/15/20 0409 11/16/20 0249 11/17/20 0452 11/18/20 0315 11/19/20 0404  WBC 9.2 6.8 6.5 6.3 7.0  HGB 9.8* 9.9* 9.7* 10.1* 9.5*  HCT 35.2* 35.4* 34.0* 35.2* 33.3*  PLT 145* 112* 103* 158 169  MCV 93.6 92.9 90.4 90.5 89.3  MCH 26.1 26.0 25.8* 26.0 25.5*  MCHC 27.8* 28.0* 28.5* 28.7* 28.5*  RDW 17.0* 17.1* 16.9* 16.8* 16.8*  LYMPHSABS 0.6* 0.6* 0.5* 0.6* 0.7  MONOABS 1.0 0.6 0.5 0.6 0.7  EOSABS 0.2 0.2 0.3 0.3 0.3  BASOSABS 0.0 0.0 0.0 0.0 0.0    Recent Labs  Lab 11/12/20 1433 11/12/20 1500 11/12/20 1556 11/13/20 1600 11/14/20 0133 11/14/20 0452 11/15/20 0409 11/16/20 0249 11/17/20 0452 11/18/20 0315 11/19/20 0404  NA 139  --    < >  --  140  --  141 142 141 141 140  K 3.5  --    < >  --  3.7  --  3.6 3.3* 3.5 3.9 3.6  CL 88*  --    < >  --  90*  --  91* 89* 92* 93* 90*  CO2 41*  --    < >  --  41*  --  42* 42* 38* 43* 37*  GLUCOSE 138*  --    < >  --  172*  --  145* 178* 162* 213* 167*  BUN 14  --    < >  --  14  --  '15 20 19 17 15  '$ CREATININE 0.52  --    < >  --  0.53  --  0.59 0.67 0.50 0.55 0.45  CALCIUM 8.3*  --    < >  --  8.8*  --  8.6* 8.9 8.6* 8.8* 8.8*  AST 20  --   --   --  16  --  '15 16 20 18 17  '$ ALT 10  --   --   --  9  --  '8 9 10 11 11  '$ ALKPHOS 75  --   --    --  80  --  75 77 80 81 86  BILITOT 1.5*  --   --   --  1.3*  --  1.1 1.1 1.0 0.8 0.9  ALBUMIN 2.8*  --   --   --  2.8*  --  2.8* 2.8* 2.8* 2.7* 2.7*  MG 1.6*  --    < >  --  1.7  --  1.7 1.7 1.6* 2.0 1.9  PROCALCITON  --   --   --  <0.10  --  <0.10 <0.10  --   --   --   --   LATICACIDVEN  --  1.2  --   --   --   --   --   --   --   --   --   INR 1.8*  --    < >  --  1.8*  --  2.1* 2.4* 3.0* 3.2* 2.4*  BNP 412.3*  --   --   --  402.6*  --  322.2* 381.4* 374.3*  --   --    < > = values in this interval not displayed.    ------------------------------------------------------------------------------------------------------------------ No results for input(s): CHOL, HDL, LDLCALC, TRIG, CHOLHDL, LDLDIRECT in the last 72 hours.  Lab Results  Component Value Date   HGBA1C 6.0 (H) 04/03/2019   ------------------------------------------------------------------------------------------------------------------ No results for input(s): TSH, T4TOTAL, T3FREE, THYROIDAB in the last 72 hours.  Invalid input(s): FREET3  Cardiac Enzymes No results for input(s): CKMB, TROPONINI, MYOGLOBIN in the last 168 hours.  Invalid input(s): CK ------------------------------------------------------------------------------------------------------------------    Component Value Date/Time   BNP 374.3 (H) 11/17/2020 0452     Radiology Reports DG Knee 1-2 Views Right  Result Date: 11/16/2020 CLINICAL DATA:  MRSA bacteremia. EXAM: RIGHT KNEE - 1-2 VIEW COMPARISON:  07/19/2020 FINDINGS: Similar appearance of the medial unicompartmental arthroplasty of the right knee compared to 07/19/2020. No abnormal lucency tracking along the hardware components is currently identified to indicate loosening or infection along the prosthesis at this time. There is substantial articular space narrowing in the patellofemoral joint with associated extensive spurring, along with mild marginal spurring in the lateral compartment. Stable  lucency along the distal femoral metaphysis medially, potentially with mild surrounding sclerosis. This is not changed from 07/19/2020 but new compared to preoperative radiographs from 05/25/2014, significance uncertain. There is a trace knee effusion in the suprapatellar bursa. Equivocal thickening of the distal quadriceps tendon, along with some mild chronic heterotopic calcification along the distal quadriceps at the level of the distal femoral diaphysis. IMPRESSION: 1. Medial unicompartmental arthroplasty of the right knee without lucency along the margins to suggest prosthesis loosening or infection. 2. Trace knee effusion. 3. Stable lucency along the distal femoral metaphysis medially potentially with mild surrounding sclerosis, no change from 07/19/2020. This could be some form  of chronic postoperative reactive lesion but is technically nonspecific and was not appreciable on the preoperative radiograph of 05/25/2014. Chronic osteomyelitis is not entirely excluded although is not favored. 4. Suspected distal quadriceps tendinopathy. Electronically Signed   By: Van Clines M.D.   On: 11/16/2020 13:23   CT HEAD WO CONTRAST (5MM)  Result Date: 11/13/2020 CLINICAL DATA:  Altered mental status. EXAM: CT HEAD WITHOUT CONTRAST TECHNIQUE: Contiguous axial images were obtained from the base of the skull through the vertex without intravenous contrast. COMPARISON:  Jul 19, 2020 FINDINGS: Brain: There is mild cerebral atrophy with widening of the extra-axial spaces and ventricular dilatation. There are areas of decreased attenuation within the white matter tracts of the supratentorial brain, consistent with microvascular disease changes. Vascular: No hyperdense vessel or unexpected calcification. Skull: Normal. Negative for fracture or focal lesion. Sinuses/Orbits: No acute finding. Other: None. IMPRESSION: 1. Generalized cerebral atrophy. 2. No acute intracranial abnormality. Electronically Signed   By:  Virgina Norfolk M.D.   On: 11/13/2020 03:39   CT PELVIS W CONTRAST  Result Date: 11/14/2020 CLINICAL DATA:  Osteomyelitis. EXAM: CT PELVIS WITH CONTRAST TECHNIQUE: Multidetector CT imaging of the pelvis was performed using the standard protocol following the bolus administration of intravenous contrast. CONTRAST:  111m OMNIPAQUE IOHEXOL 350 MG/ML SOLN COMPARISON:  Jul 19, 2020.  April 06, 2019. FINDINGS: Urinary Tract: Urinary bladder is decompressed secondary to Foley catheter. Bowel:  Unremarkable visualized pelvic bowel loops. Vascular/Lymphatic: No pathologically enlarged lymph nodes. No significant vascular abnormality seen. Reproductive: Status post hysterectomy. No definite adnexal abnormality is noted. Other: No ascites is noted. 6.8 cm presacral mass is noted most consistent with sacral myelolipoma as noted on prior MRI. Decubitus ulceration is seen overlying the lower sacrum and coccyx. Musculoskeletal: No suspicious bone lesions identified. Status post right total hip arthroplasty. No lytic destruction is seen to suggest osteomyelitis. IMPRESSION: Decubitus ulcer is seen overlying the lower sacrum and coccyx, but no underlying osteomyelitis is noted. Stable 6.8 cm presacral mass is noted most consistent with sacral myelolipoma as noted on prior MRI. Electronically Signed   By: JMarijo ConceptionM.D.   On: 11/14/2020 15:41   MR KNEE LEFT WO CONTRAST  Result Date: 11/16/2020 CLINICAL DATA:  MRSA bacteremia, drainage and erythema from sacral wound, concern for potential septic arthritis EXAM: MRI OF THE LEFT KNEE WITHOUT CONTRAST TECHNIQUE: Multiplanar, multisequence MR imaging of the knee was performed. No intravenous contrast was administered. Three severely motion degraded sequences were obtained and the patient refused all further imaging. COMPARISON:  09/03/2017 radiographs FINDINGS: 3 severely motion degraded sequences are provided, which substantially reduces diagnostic sensitivity and  specificity. The patient refused other imaging. MENISCI Medial meniscus: Ill definition of the root of the posterior horn medial meniscus, cannot exclude radial tear. Lateral meniscus: Ill definition of the root of the posterior horn lateral meniscus, cannot exclude tear. LIGAMENTS Cruciates:  Grossly intact Collaterals:  Grossly intact CARTILAGE Patellofemoral: At least moderate chondral thinning with prominent spurring. Medial: Severe full-thickness articular cartilage loss. Prominent marginal spurring. Lateral:  Prominent marginal spurring. Joint:  Small knee effusion.  Suspected mild synovitis. Popliteal Fossa: Low-grade infiltrative edema but not disproportionate to the generalized subcutaneous edema. Extensor Mechanism:  Grossly intact Bones: Suspected chronic mild avascular necrosis in the distal femoral metaphysis. Other: 2.3 by 1.3 by 3.4 cm (volume = 5.3 cm^3) proteinaceous or hemorrhagic fluid signal intensity structure along the superficial fascia margin of the left proximal calf superficial to the fibula on image 23 series  5. This has high T2 and high T1 signal characteristics and also marginal low signal which may represent marginal hemosiderin deposition. Accordingly a chronic hematoma is favored. IMPRESSION: 1. Today's exam consists of 3 severely motion degraded sequences. The patient refused completion of the exam and any further MR imaging. This substantially reduces diagnostic sensitivity and specificity. 2. There is a small knee effusion with suspected mild synovitis. The effusion is less than I would typically expect in the setting of septic joint, and there is no overt marrow edema along the articular surfaces to indicate concomitant osteomyelitis. That said, early infection cannot be totally excluded based on the images provided. 3. Ill definition of the roots of the posterior horn medial meniscus and lateral meniscus, cannot exclude radial tears. 4. Severe osteoarthritis especially in the  medial compartment. 5. Generalized infiltrative edema in the subcutaneous tissues and popliteal region. 6. About 5.3 cm hematoma along the superficial fascia margin lateral to the proximal fibia, with hemosiderin along the margins suggesting some chronicity. 7. Mild chronic AVN in the distal femoral metaphysis. Electronically Signed   By: Van Clines M.D.   On: 11/16/2020 13:15   DG Chest Port 1 View  Result Date: 11/13/2020 CLINICAL DATA:  SOB (shortness of breath) R06.02 (ICD-10-CM) EXAM: PORTABLE CHEST 1 VIEW COMPARISON:  11/12/2020. FINDINGS: Similar mild enlargement cardiac silhouette. Similar pulmonary vascular congestion. Similar bibasilar opacities and probable small right pleural effusion. Mild interstitial prominence, possibly mild interstitial edema. No visible pneumothorax IMPRESSION: No substantial change from the prior. Similar cardiomegaly, pulmonary vascular congestion, probable small right pleural effusion and possible mild interstitial edema. Early pneumonia is not excluded at the lung bases with similar bibasilar opacities. Electronically Signed   By: Margaretha Sheffield M.D.   On: 11/13/2020 12:12   DG Chest Port 1 View  Result Date: 11/12/2020 CLINICAL DATA:  Questionable sepsis.  Evaluate for abnormality. EXAM: PORTABLE CHEST 1 VIEW COMPARISON:  Radiographs 04/06/2019 and 04/02/2019.  CT 07/19/2020. FINDINGS: 1556 hours. Right arm PICC has been removed. The heart remains enlarged. There is increased vascular congestion with patchy opacities at both lung bases and a probable small right pleural effusion. No evidence of pneumothorax. The bones appear unchanged. Telemetry leads overlie the chest. IMPRESSION: Cardiomegaly with vascular congestion, probable small right pleural effusion and probable mild edema. Cannot exclude early pneumonia at the lung bases. Radiographic follow up recommended. Electronically Signed   By: Richardean Sale M.D.   On: 11/12/2020 16:31   ECHOCARDIOGRAM  LIMITED  Result Date: 11/14/2020    ECHOCARDIOGRAM LIMITED REPORT   Patient Name:   JARETH MAMMENGA Parrella Date of Exam: 11/14/2020 Medical Rec #:  [id number]   Height:       70.0 in Accession #:    [id number]  Weight:       252.0 lb Date of Birth:  21-Dec-1950  BSA:          2.303 m Patient Age:    45 years    BP:           123/66 mmHg Patient Gender: F           HR:           80 bpm. Exam Location:  Inpatient Procedure: Limited Echo, Color Doppler and Cardiac Doppler Indications:    [id number] Acute diastolic (congestive) heart failure  History:        Patient has prior history of Echocardiogram examinations, most  recent 07/20/2020. CHF, Arrythmias:Atrial Fibrillation; Risk                 Factors:Hypertension, Diabetes and Sleep Apnea. Prior performed                 at [facility]:    [facility] Referring Phys: Arnell Asal Margaree Mackintosh [place]  1. Left ventricular ejection fraction, by estimation, is 60 to 65%. The left ventricle has normal function. The left ventricle has no regional wall motion abnormalities.  2. Right ventricular systolic function is mildly reduced. The right ventricular size is normal. There is severely elevated pulmonary artery systolic pressure.  3. Left atrial size was moderately dilated.  4. Right atrial size was severely dilated.  5. The mitral valve is degenerative. Trivial mitral valve regurgitation. No evidence of mitral stenosis.  6. Tricuspid valve regurgitation is moderate.  7. Compared with the echo 08/2017, aortic valve mean gradient has increased frm 9 mmHg to 17 mmHg. The aortic valve is normal in structure. There is moderate calcification of the aortic valve. There is moderate thickening of the aortic valve. Aortic valve regurgitation is not visualized. Mild aortic valve stenosis.  8. The inferior vena cava is dilated in size with <50% respiratory variability, suggesting right atrial pressure of 15 mmHg. FINDINGS  Left Ventricle: Left ventricular ejection  fraction, by estimation, is 60 to 65%. The left ventricle has normal function. The left ventricle has no regional wall motion abnormalities. The left ventricular internal cavity size was normal in size. There is  no left ventricular hypertrophy. Right Ventricle: The right ventricular size is normal. No increase in right ventricular wall thickness. Right ventricular systolic function is mildly reduced. There is severely elevated pulmonary artery systolic pressure. The tricuspid regurgitant velocity is 3.60 m/s, and with an assumed right atrial pressure of 15 mmHg, the estimated right ventricular systolic pressure is [id number] mmHg. Left Atrium: Left atrial size was moderately dilated. Right Atrium: Right atrial size was severely dilated. Pericardium: There is no evidence of pericardial effusion. Mitral Valve: The mitral valve is degenerative in appearance. Mild mitral annular calcification. Trivial mitral valve regurgitation. No evidence of mitral valve stenosis. Tricuspid Valve: The tricuspid valve is normal in structure. Tricuspid valve regurgitation is moderate . No evidence of tricuspid stenosis. Aortic Valve: Compared with the echo 08/2017, aortic valve mean gradient has increased frm 9 mmHg to 17 mmHg. The aortic valve is normal in structure. There is moderate calcification of the aortic valve. There is moderate thickening of the aortic valve. Aortic valve regurgitation is not visualized. Mild aortic stenosis is present. Aortic valve mean gradient measures 17.0 mmHg. Aortic valve peak gradient measures 26.6 mmHg. Aortic valve area, by VTI measures 1.14 cm. Pulmonic Valve: The pulmonic valve was normal in structure. Pulmonic valve regurgitation is not visualized. No evidence of pulmonic stenosis. Aorta: The aortic root is normal in size and structure. Venous: The inferior vena cava is dilated in size with less than 50% respiratory variability, suggesting right atrial pressure of 15 mmHg. IAS/Shunts: No atrial level  shunt detected by color flow Doppler. LEFT VENTRICLE PLAX 2D LVOT diam:     1.80 cm LV SV:         60 LV SV Index:   26 LVOT Area:     2.54 cm  AORTIC VALVE AV Area (Vmax):    1.15 cm AV Area (Vmean):   1.20 cm AV Area (VTI):     1.14 cm AV Vmax:  258.00 cm/s AV Vmean:          193.000 cm/s AV VTI:            0.521 m AV Peak Grad:      26.6 mmHg AV Mean Grad:      17.0 mmHg LVOT Vmax:         117.00 cm/s LVOT Vmean:        91.000 cm/s LVOT VTI:          0.234 m LVOT/AV VTI ratio: 0.45 TRICUSPID VALVE TR Peak grad:   51.8 mmHg TR Vmax:        360.00 cm/s  SHUNTS Systemic VTI:  0.23 m Systemic Diam: 1.80 cm Skeet Latch MD Electronically signed by Skeet Latch MD Signature Date/Time: 11/14/2020/5:37:02 PM    Final    [place] EKG SITE RITE  Result Date: 11/18/2020 If Site Rite image not attached, placement could not be confirmed due to current cardiac rhythm.

## 2020-11-20 ENCOUNTER — Other Ambulatory Visit (HOSPITAL_COMMUNITY): Payer: Self-pay

## 2020-11-20 DIAGNOSIS — L89324 Pressure ulcer of left buttock, stage 4: Secondary | ICD-10-CM | POA: Diagnosis not present

## 2020-11-20 DIAGNOSIS — Z789 Other specified health status: Secondary | ICD-10-CM | POA: Diagnosis not present

## 2020-11-20 DIAGNOSIS — I509 Heart failure, unspecified: Secondary | ICD-10-CM | POA: Diagnosis not present

## 2020-11-20 DIAGNOSIS — R7881 Bacteremia: Secondary | ICD-10-CM | POA: Diagnosis not present

## 2020-11-20 DIAGNOSIS — B9562 Methicillin resistant Staphylococcus aureus infection as the cause of diseases classified elsewhere: Secondary | ICD-10-CM | POA: Diagnosis not present

## 2020-11-20 LAB — COMPREHENSIVE METABOLIC PANEL
ALT: 8 U/L (ref 0–44)
AST: 18 U/L (ref 15–41)
Albumin: 2.9 g/dL — ABNORMAL LOW (ref 3.5–5.0)
Alkaline Phosphatase: 83 U/L (ref 38–126)
Anion gap: 8 (ref 5–15)
BUN: 15 mg/dL (ref 8–23)
CO2: 38 mmol/L — ABNORMAL HIGH (ref 22–32)
Calcium: 8.7 mg/dL — ABNORMAL LOW (ref 8.9–10.3)
Chloride: 92 mmol/L — ABNORMAL LOW (ref 98–111)
Creatinine, Ser: 0.38 mg/dL — ABNORMAL LOW (ref 0.44–1.00)
GFR, Estimated: >60 mL/min (ref >60)
Glucose, Bld: 156 mg/dL — ABNORMAL HIGH (ref 70–99)
Potassium: 3.4 mmol/L — ABNORMAL LOW (ref 3.5–5.1)
Sodium: 138 mmol/L (ref 135–145)
Total Bilirubin: 1.1 mg/dL (ref 0.3–1.2)
Total Protein: 6.4 g/dL — ABNORMAL LOW (ref 6.5–8.1)

## 2020-11-20 LAB — VANCOMYCIN, TROUGH: Vancomycin Tr: 38 ug/mL (ref 15–20)

## 2020-11-20 LAB — CBC WITH DIFFERENTIAL/PLATELET
Abs Immature Granulocytes: 0.05 10*3/uL (ref 0.00–0.07)
Basophils Absolute: 0 10*3/uL (ref 0.0–0.1)
Basophils Relative: 0 %
Eosinophils Absolute: 0.3 10*3/uL (ref 0.0–0.5)
Eosinophils Relative: 4 %
HCT: 33.8 % — ABNORMAL LOW (ref 36.0–46.0)
Hemoglobin: 9.9 g/dL — ABNORMAL LOW (ref 12.0–15.0)
Immature Granulocytes: 1 %
Lymphocytes Relative: 14 %
Lymphs Abs: 1 10*3/uL (ref 0.7–4.0)
MCH: 26 pg (ref 26.0–34.0)
MCHC: 29.3 g/dL — ABNORMAL LOW (ref 30.0–36.0)
MCV: 88.7 fL (ref 80.0–100.0)
Monocytes Absolute: 0.7 10*3/uL (ref 0.1–1.0)
Monocytes Relative: 10 %
Neutro Abs: 4.8 10*3/uL (ref 1.7–7.7)
Neutrophils Relative %: 71 %
Platelets: 194 10*3/uL (ref 150–400)
RBC: 3.81 MIL/uL — ABNORMAL LOW (ref 3.87–5.11)
RDW: 16.8 % — ABNORMAL HIGH (ref 11.5–15.5)
WBC: 6.8 10*3/uL (ref 4.0–10.5)
nRBC: 0 % (ref 0.0–0.2)

## 2020-11-20 LAB — CULTURE, BLOOD (ROUTINE X 2)
Culture: NO GROWTH
Culture: NO GROWTH
Special Requests: ADEQUATE
Special Requests: ADEQUATE

## 2020-11-20 LAB — GLUCOSE, CAPILLARY
Glucose-Capillary: 146 mg/dL — ABNORMAL HIGH (ref 70–99)
Glucose-Capillary: 202 mg/dL — ABNORMAL HIGH (ref 70–99)
Glucose-Capillary: 231 mg/dL — ABNORMAL HIGH (ref 70–99)
Glucose-Capillary: 221 mg/dL — ABNORMAL HIGH (ref 70–99)

## 2020-11-20 LAB — RESP PANEL BY RT-PCR (FLU A&B, COVID) ARPGX2
Influenza A by PCR: NEGATIVE
Influenza B by PCR: NEGATIVE
SARS Coronavirus 2 by RT PCR: NEGATIVE

## 2020-11-20 LAB — PROTIME-INR
INR: 2.2 — ABNORMAL HIGH (ref 0.8–1.2)
Prothrombin Time: 24.3 seconds — ABNORMAL HIGH (ref 11.4–15.2)

## 2020-11-20 LAB — MAGNESIUM: Magnesium: 2 mg/dL (ref 1.7–2.4)

## 2020-11-20 MED ORDER — VANCOMYCIN IV (FOR PTA / DISCHARGE USE ONLY): 1750.0000 mg | INTRAVENOUS | 0 refills | Status: AC

## 2020-11-20 MED ORDER — AMOXICILLIN-POT CLAVULANATE 875-125 MG PO TABS
1.0000 | ORAL_TABLET | Freq: Two times a day (BID) | ORAL | 0 refills | Status: AC
  Filled 2020-11-20: qty 28, 14d supply, fill #0

## 2020-11-20 MED ORDER — BASAGLAR KWIKPEN 100 UNIT/ML ~~LOC~~ SOPN: 12.0000 [IU] | PEN_INJECTOR | Freq: Every day | SUBCUTANEOUS | Status: AC

## 2020-11-20 MED ORDER — WARFARIN SODIUM 7.5 MG PO TABS
7.5000 mg | ORAL_TABLET | Freq: Once | ORAL | Status: AC
  Administered 2020-11-20: 7.5 mg via ORAL
  Filled 2020-11-20: qty 1

## 2020-11-20 MED ORDER — PANTOPRAZOLE SODIUM 40 MG PO TBEC: 40.0000 mg | DELAYED_RELEASE_TABLET | Freq: Every day | ORAL | Status: AC

## 2020-11-20 MED ORDER — POTASSIUM CHLORIDE CRYS ER 20 MEQ PO TBCR
40.0000 meq | EXTENDED_RELEASE_TABLET | Freq: Once | ORAL | Status: AC
  Administered 2020-11-20: 40 meq via ORAL
  Filled 2020-11-20: qty 2

## 2020-11-20 MED ORDER — ALPRAZOLAM 0.5 MG PO TABS: 0.5000 mg | ORAL_TABLET | Freq: Two times a day (BID) | ORAL | 0 refills | Status: AC | PRN

## 2020-11-20 NOTE — Progress Notes (Signed)
Pharmacy Antibiotic Note  Donna [name] is a 70 y.o. female admitted on 11/12/2020 with bacteremia.  Pharmacy has been consulted for vancomycin dosing.  Donna [name] is a 70 y.o. female admitted from [facility] with AMS, increased peripheral edema. Found to have fever, MRSA bacteremia 1/4 bottles preliminarily, chronic sacral wound with malodorous drainage. WBC WNL and afebrile. Noted plan to send home with Vancomycin (stop date 10/13) and Augmentin (stop date 9/28).   Vanc trough 38 mcg/ml (INACCURATE as vanc was infusing when trough drawn)  Plan: Continue Vancomycin 1750 mg IV q24h (eAUC 479, Scr 0.8) Goal AUC 400-550 Continue Unasyn 3 g every 6 hours Consider Vanc trough 9/20 pm if pt still here  Height: 5\' 10"  (177.8 cm) Weight: 114.8 kg (253 lb) IBW/kg (Calculated) : 68.5  Temp (24hrs), Avg:97.8 F (36.6 C), Min:97.5 F (36.4 C), Max:97.9 F (36.6 C)  Recent Labs  Lab 11/16/20 0249 11/17/20 0452 11/18/20 0315 11/19/20 0404 11/19/20 2339  WBC 6.8 6.5 6.3 7.0 6.8  CREATININE 0.67 0.50 0.55 0.45 0.38*  VANCOTROUGH  --   --   --   --  38*  VANCOPEAK  --   --   --  36  --      Estimated Creatinine Clearance: 91.2 mL/min (A) (by C-G formula based on SCr of 0.38 mg/dL (L)).    Allergies  Allergen Reactions   Bee Venom Hives    Takes benadryl   Other Other (See Comments)    Raw foods with seeds give her "boils".  Avoids raw strawberries, blueberries. Tolerates cooked fruits, tomato sauce, bread/grains with seeds. [facility] 10/17/13 Berries with seeds Allergy to glutamine-C-quercet-selen-brom per [facility] 09/25/17   Zosyn [Piperacillin Sod-Tazobactam So] Rash    Itchy total body rash.   Alteplase Rash   Cefepime Rash   Fentanyl Other (See Comments)    Hallucinations, syncope     Metformin And Related Diarrhea   Vancomycin Rash    Antimicrobials this admission: Daptomycin 9/14 x1 dose Levofloxacin 9/12 >> 9/13 Unasyn 9/14 >> Vanc 9/14 >> (10/13)  Dose adjustments this  admission: Vancomycin 1750 mg IV Q24h Vancomycin 1250 mg IV Q24h  Microbiology results: 9/15 Bcx: NGTD 9/12 BCx: MRSA 1/4 9/12 UCx: Providencia stuartii   Thank you for allowing pharmacy to participate in this patient's care.  Sherlon Handing, PharmD, BCPS Please see amion for complete clinical pharmacist phone list 11/20/2020 12:53 AM

## 2020-11-20 NOTE — Discharge Instructions (Signed)
Follow with Primary MD O'Buch, Greta, PA-C in 7 days   Get CBC, CMP, INR  -  checked next visit within 2 days by SNF MD    Activity: As tolerated with Full fall precautions use walker/cane & assistance as needed  Disposition SNF  Diet: Heart Healthy low carbohydrate diet with 1.5 L total fluid restriction per day.  Check CBGs QA CHS at SNF  Special Instructions: If you have smoked or chewed Tobacco  in the last 2 yrs please stop smoking, stop any regular Alcohol  and or any Recreational drug use.  On your next visit with your primary care physician please Get Medicines reviewed and adjusted.  Please request your Prim.MD to go over all Hospital Tests and Procedure/Radiological results at the follow up, please get all Hospital records sent to your Prim MD by signing hospital release before you go home.  If you experience worsening of your admission symptoms, develop shortness of breath, life threatening emergency, suicidal or homicidal thoughts you must seek medical attention immediately by calling 911 or calling your MD immediately  if symptoms less severe.  You Must read complete instructions/literature along with all the possible adverse reactions/side effects for all the Medicines you take and that have been prescribed to you. Take any new Medicines after you have completely understood and accpet all the possible adverse reactions/side effects.

## 2020-11-20 NOTE — Progress Notes (Signed)
Donna [name] gave report to nurse [facility]. No question at this time.

## 2020-11-20 NOTE — Progress Notes (Signed)
Pt refused to wear cpap for the night.

## 2020-11-20 NOTE — Progress Notes (Signed)
[facility] for Coumadin Indication: atrial fibrillation  Allergies  Allergen Reactions   Bee Venom Hives    Takes benadryl   Other Other (See Comments)    Raw foods with seeds give her "boils".  Avoids raw strawberries, blueberries. Tolerates cooked fruits, tomato sauce, bread/grains with seeds. [facility] 10/17/13 Berries with seeds Allergy to glutamine-C-quercet-selen-brom per [facility] 09/25/17   Zosyn [Piperacillin Sod-Tazobactam So] Rash    Itchy total body rash.   Alteplase Rash   Cefepime Rash   Fentanyl Other (See Comments)    Hallucinations, syncope     Metformin And Related Diarrhea   Vancomycin Rash    Patient Measurements: Height: 5\' 10"  (177.8 cm) Weight: 112.9 kg (249 lb) IBW/kg (Calculated) : 68.5  Vital Signs: Temp: 98.6 F (37 C) (09/20 0745) Temp Source: Oral (09/20 0745) BP: 116/69 (09/20 0745)  Labs: Recent Labs    11/18/20 0315 11/19/20 0404 11/19/20 2339  HGB 10.1* 9.5* 9.9*  HCT 35.2* 33.3* 33.8*  PLT 158 169 194  LABPROT 32.8* 26.3* 24.3*  INR 3.2* 2.4* 2.2*  CREATININE 0.55 0.45 0.38*     Estimated Creatinine Clearance: 90.4 mL/min (A) (by C-G formula based on SCr of 0.38 mg/dL (L)).  Assessment: 70 y.o. female admitted with CHF.  Pharmacy consulted to continue Coumadin for history of Afib.  Spoke to [facility] staff, patient's Coumadin dose was increased on 9/12 to 11mg  PO daily PTA.  INR was supratherapeutic at 3.2 few days ago, had warfarin dose reduced for several days inpatient. INR now down to 2.2, therapeutic (daily labs for today drawn just before midnight on 9/19).  Patient remains on carbamazepine. Hemoglobin stable, and platelet trending up; no bleeding reported.  Last dose of Levaquin 9/13, likely not affecting INR.   Goal of Therapy:  INR 2-3 Monitor platelets by anticoagulation protocol: Yes   Plan:  Warfarin 7.5mg  PO x 1 dose at 1600 Monitor daily INR, CBC, s/sx bleeding   Arturo Morton, PharmD, BCPS Please check AMION for all [facility] contact numbers Clinical Pharmacist 11/20/2020 8:14 AM

## 2020-11-20 NOTE — Progress Notes (Signed)
Brief Palliative Medicine Progress Note:  Chart review performed. Noted patient to be discharged back to [facility] today.   Called patient's son/Carlton to offer support and follow up on conversation yesterday. He was able to speak with TOC yesterday and is waiting to hear back for follow up this AM. Goal is for patient to discharge back to [facility] for rehab while Medicaid application is being completed; then, goal is for LTC likely with hospice once antibiotic course is completed. Wilber Oliphant is agreeable to outpatient Palliative Care to follow patient at this time. He understands they can transition patient over to hospice when they are ready for their services.   Introduced and reviewed MOST form - Carlton requests to complete with outpatient PC as he is currently at work and not able to give his full attention to completing the form. He does state he "probably has answers to all of" the sections.   Recommendations/Plan: Continue antibiotics for recommended course Continue DNR/DNI as previously documented Goal is for discharge to rehab with outpatient Palliative Care to follow while Medicaid application is pending, then goal is for LTC placement and likely transition to hospice [facility] consulted for: outpatient Palliative Care referral Outpatient Palliative Care team to complete MOST form with son  Thank you for allowing PMT to assist in the care of this patient.  Tonisha Silvey M. Tamala Julian, FNP-BC Palliative Medicine Team Team Phone: [phone] Total time: 20 minutes  Greater than 50%  of this time was spent counseling and coordinating care related to the above assessment and plan.

## 2020-11-20 NOTE — TOC Transition Note (Addendum)
Transition of Care [facility]) - CM/SW Discharge Note   Patient Details  Name: Donna [name] MRN: [id number] Date of Birth: December 29, 1950  [facility] CM/SW Contact:  Tresa Endo Phone Number: 11/20/2020, 1:05 PM   Clinical Narrative:    Patient will DC to: [place] Anticipated DC date: 11/20/2020 Family notified: Pt Son Transport by: Corey Harold   Per MD patient ready for DC to [facility] room 212B. RN to call report prior to discharge [phone]). RN, patient, patient's family, and facility notified of DC. Discharge Summary and FL2 sent to facility. DC packet on chart. Ambulance transport requested for patient.   CSW will sign off for now as social work intervention is no longer needed. Please consult [place] again if new needs arise.     Final next level of care: Skilled Nursing Facility Barriers to Discharge: Continued Medical Work up   Patient Goals and CMS Choice Patient states their goals for this hospitalization and ongoing recovery are:: Rehab CMS Medicare.gov Compare Post Acute Care list provided to:: Patient Choice offered to / list presented to : Patient  Discharge Placement                       Discharge Plan and Services In-house Referral: Clinical Social Work   Post Acute Care Choice: [facility]                               Social Determinants of Health (SDOH) Interventions     Readmission Risk Interventions No flowsheet data found.

## 2020-11-20 NOTE — Progress Notes (Signed)
Notified by lab of critical Vanc. trough of 38. Vanc was running at the time labs were drawn. Spoke with pharmacy. Pharmacy to retime vanc trough draw.

## 2020-11-20 NOTE — Consult Note (Signed)
   [facility]  General Hospital Inpatient Consult   11/20/2020  Donna [name] 28-Jun-1950 [id number]  [facility] [ACO] Patient: Medicare CMS DCE  Patient screened for hospitalization with noted high risk score for unplanned readmission risk and  to assess for potential [facility] service needs for post hospital transition.  Review of patient's medical record reveals patient is to return to [facility] for [place].   Plan:  Patient to a skilled nursing facility and needs will be met at that level of care for transition.  For questions contact:   Natividad Brood, RN BSN [facility] Liaison  [phone] business mobile phone Toll free office [phone]  Fax number: [phone] [email or web address]

## 2020-11-20 NOTE — Discharge Summary (Addendum)
Donna [name] YYT:[id number] DOB: Sep 26, 1950 DOA: 11/12/2020  PCP: Janine Limbo, PA-C  Admit date: 11/12/2020  Discharge date: 11/20/2020  Admitted From: SNF  Disposition:  SNF   Recommendations for Outpatient Follow-up:   Follow up with PCP in 1-2 weeks  PCP Please obtain BMP/CBC, 2 view CXR in 1week,  (see Discharge instructions)   PCP Please follow up on the following pending results:    Home Health: None   Equipment/Devices: None  Consultations: ID Discharge Condition: Fair   CODE STATUS: DNR   Diet Recommendation: Heart Healthy - Low Carb, 1.5 L fluid restriction per day.      Chief Complaint  Patient presents with   Altered Mental Status     Brief history of present illness from the day of admission and additional interim summary    Donna [name] is a 70 y.o. female with history of diastolic CHF last EF measured in 2019 was 55 to 60% with history of A. fib diabetes mellitus type 2, anemia, sleep apnea Parkinson's disease bedbound with sacral ulcers was brought to the ER after patient was found to have increasing peripheral edema with shortness of breath, was dignosed with CHF and admitted to the hospital.                                                                 Hospital Course   Acute Hypoxic Resp. Failure due to Acute on chronic diastolic CHF last EF 27%.  She was diuresed with IV Lasix, continue Aldactone and beta-blocker, continue supportive care with oxygen and monitor, TTE  stable, symptoms are much improved and now she is at baseline, she was also seen by cardiology, upon discharge continue home dose Lasix, Aldactone and beta-blocker along with strict 1.5 L/day fluid restriction.  Continue supplemental 2 to 3 L nasal cannula oxygen as needed.  Follow-up with cardiology post discharge.     2.   Sacral decubitus ulcers present on admission with 1 out of 4 MRSA bacteremia.  Foley if needed for hygiene, wound care team consulted. 1/4 MRSA +ve BC, ID following on Vanco, TTE -ve, blood cultures are negative for over 3 days, PICC line to be placed 11/20/2019 thereafter likely discharge to SNF on 11/21/2018, vancomycin last date is 12/13/2020, discontinue PICC line once she has been seen and cleared by ID on the seventh of next month to stop antibiotics IV.  Also some chronic bilateral knee pain, on exam no effusion or redness, no warmth, there is some tenderness in both knees but patient claims that is chronic, MRI noted and it was inconclusive.  This is not an acute problem.   Foley in to augment Sacral decub ulcer healing, DC in 2 weeks.    3.  OSA.  CPAP at night.   4.  Chronic anemia and thrombocytopenia.  Monitor.   5.  UTI.  Cultures noted, continue Augmentin finish on 11/14/2020.   6.  Underlying Parkinson's.  On combination of Sinemet and amantadine, has underlying dementia per history from son for mths.  Does have underlying resting tremors.   7.  Hypertension.  Propranolol and ARB combination along with diuretics.   8.  Paroxysmal atrial fibrillation.  Mali vas 2 score of greater than 3.  On combination of propranolol and Coumadin.  Pharmacy at SNF to monitor INR.   9.  Obesity.  BMI 32.  Follow-up with PCP for weight loss.   10. Toxic Encephalopathy - likely hospital-acquired delirium along with multiple sedative medications, held Neurontin, Wellbutrin  encephalopathy much improved.  Now close to her baseline, not at baseline she has dementia..   11.  Hypokalemia and hypomagnesemia.  Replaced.   12. 11/17/20 short NSVT on tele - asymptomatic, EF > 65%, on B.Blocker, Mag was low - replaced. Stable now.   13. DM type II.  On Lantus  and sliding scale.  Check CBGs QA CHS at SNF monitor and adjust.   Per son he wishes his mom to be DNR, continue with medical care including  antibiotics.  No invasive procedures or heroics.  If further declines full comfort measures.  With involve palliative care.   Discharge diagnosis     Principal Problem:   MRSA bacteremia Active Problems:   Permanent atrial fibrillation [facility]): CHA2DS2-VASc Score 5; on Warfarin   OSA (obstructive sleep apnea)   Essential hypertension   Type 2 diabetes mellitus (HCC)   Decubitus ulcer of ischium, left, stage IV (HCC)   Acute on chronic diastolic CHF (congestive heart failure) (HCC)   Acute CHF (congestive heart failure) ([place])   Acute pain of left knee   Palliative care by specialist   Goals of care, counseling/discussion   DNR (do not resuscitate)   DNI (do not intubate)   DNR (do not resuscitate) discussion   Encounter for hospice care discussion   Advanced directive placed in chart this admission    Discharge instructions    Discharge Instructions     [facility] pharmacist to adjust dose for Vancomycin, Aminoglycosides and other anti-infective therapies as requested by physician.   Complete by: As directed    [facility] to provide Cath Flo 2mg    Complete by: As directed    Administer for PICC line occlusion and as ordered by physician for other access device issues.   Anaphylaxis Kit: Provided to treat any anaphylactic reaction to the medication being provided to the patient if First Dose or when requested by physician   Complete by: As directed    Epinephrine 1mg /ml vial / amp: Administer 0.3mg  (0.66ml) subcutaneously once for moderate to severe anaphylaxis, nurse to call physician and pharmacy when reaction occurs and call 911 if needed for immediate care   Diphenhydramine 50mg /ml IV vial: Administer 25-50mg  IV/IM PRN for first dose reaction, rash, itching, mild reaction, nurse to call physician and pharmacy when reaction occurs   Sodium Chloride 0.9% NS 551ml IV: Administer if needed for hypovolemic blood pressure drop or as ordered by physician after  call to physician with anaphylactic reaction   Change dressing on IV access line weekly and PRN   Complete by: As directed    Discharge instructions   Complete by: As directed    Follow with Primary MD O'Buch, Greta, PA-C in 7 days   Get CBC, CMP, INR  -  checked next visit within 2 days by SNF MD    Activity: As tolerated with Full fall precautions use walker/cane & assistance as needed  Disposition SNF  Diet: Heart Healthy low carbohydrate diet with 1.5 L total fluid restriction per day.  Check CBGs QA CHS at SNF  Special Instructions: If you have smoked or chewed Tobacco  in the last 2 yrs please stop smoking, stop any regular Alcohol  and or any Recreational drug use.  On your next visit with your primary care physician please Get Medicines reviewed and adjusted.  Please request your Prim.MD to go over all Hospital Tests and Procedure/Radiological results at the follow up, please get all Hospital records sent to your Prim MD by signing hospital release before you go home.  If you experience worsening of your admission symptoms, develop shortness of breath, life threatening emergency, suicidal or homicidal thoughts you must seek medical attention immediately by calling 911 or calling your MD immediately  if symptoms less severe.  You Must read complete instructions/literature along with all the possible adverse reactions/side effects for all the Medicines you take and that have been prescribed to you. Take any new Medicines after you have completely understood and accpet all the possible adverse reactions/side effects.   Discharge wound care:   Complete by: As directed    Wound care to chronic, full thickness Stage 4 pressure injury to coccygeal pressure injury :  Cleanse with NS, pat gently dry. Fill defect with silver hydrofiber dressing (Aquacel Ag+ Advantage, Lawson # [id number]), using a cotton-tipped applicator to fill any areas of undermining. Top with dry gauze, cover with silicone  foam.  Change silver hydrofiber once daily, may reused foam for up to 3 days. Change PRN for soiling or rolling of dressing edges   Flush IV access with Sodium Chloride 0.9% and Heparin 10 units/ml or 100 units/ml   Complete by: As directed    Home infusion instructions - [facility]   Complete by: As directed    Instructions: Flush IV access with Sodium Chloride 0.9% and Heparin 10units/ml or 100units/ml   Change dressing on IV access line: Weekly and PRN   Instructions Cath Flo $Remove'2mg'hQybrLT$ : Administer for PICC Line occlusion and as ordered by physician for other access device   [facility] pharmacist to adjust dose for: Vancomycin, Aminoglycosides and other anti-infective therapies as requested by physician   Increase activity slowly   Complete by: As directed    Method of administration may be changed at the discretion of home infusion pharmacist based upon assessment of the patient and/or caregiver's ability to self-administer the medication ordered   Complete by: As directed        Discharge Medications   Allergies as of 11/20/2020       Reactions   Bee Venom Hives   Takes benadryl   Other Other (See Comments)   Raw foods with seeds give her "boils".  Avoids raw strawberries, blueberries. Tolerates cooked fruits, tomato sauce, bread/grains with seeds. [facility] 10/17/13 Berries with seeds Allergy to glutamine-C-quercet-selen-brom per [facility] 09/25/17   Zosyn [piperacillin Sod-tazobactam So] Rash   Itchy total body rash.   Alteplase Rash   Cefepime Rash   Fentanyl Other (See Comments)   Hallucinations, syncope     Metformin And Related Diarrhea   Vancomycin Rash        Medication List     STOP taking these medications    buPROPion 150 MG 24 hr tablet Commonly known as: WELLBUTRIN  XL   gabapentin 300 MG capsule Commonly known as: NEURONTIN   omeprazole 20 MG capsule Commonly known as: PRILOSEC Replaced by: pantoprazole 40 MG tablet   oxyCODONE 5 MG  immediate release tablet Commonly known as: Oxy IR/ROXICODONE       TAKE these medications    acetaminophen 500 MG tablet Commonly known as: TYLENOL Take 500-1,000 mg by mouth See admin instructions. Take 1000mg  by mouth twice daily.  May take 500mg  every 6 hours as needed for pain.  Do not exceed 4000mg  in 24 hours.   ALPRAZolam 0.5 MG tablet Commonly known as: XANAX Take 1 tablet (0.5 mg total) by mouth 2 (two) times daily as needed for anxiety.   amantadine 100 MG capsule Commonly known as: SYMMETREL Take 100 mg by mouth daily.   amoxicillin-clavulanate 875-125 MG tablet Commonly known as: Augmentin Take 1 tablet by mouth 2 (two) times daily.   Basaglar KwikPen 100 UNIT/ML Inject 12 Units into the skin at bedtime. What changed: how much to take   carbamazepine 100 MG 12 hr tablet Commonly known as: TEGRETOL XR Take 100 mg by mouth 2 (two) times daily.   carbidopa-levodopa 10-100 MG tablet Commonly known as: SINEMET IR Take 1 tablet by mouth 3 (three) times daily.   cholecalciferol 25 MCG (1000 UNIT) tablet Commonly known as: VITAMIN D3 Take 1,000 Units by mouth daily.   Dulaglutide 1.5 MG/0.5ML Sopn Inject 1.5 mg into the skin every Monday.   furosemide 20 MG tablet Commonly known as: LASIX Take 1 tablet (20 mg total) by mouth daily. What changed: how much to take   insulin lispro 100 UNIT/ML injection Commonly known as: HUMALOG Inject 0-8 Units into the skin See admin instructions. Three times daily per sliding scale: 0-200 0 units 201-250 2 units 251-300 4 units 301-350 6 units 351-400 8 units Greater than 400 call MD   iron polysaccharides 150 MG capsule Commonly known as: NIFEREX Take 150 mg by mouth daily.   lidocaine 5 % Commonly known as: LIDODERM Place 1 patch onto the skin daily. Remove & Discard patch within 12 hours or as directed by MD   losartan 25 MG tablet Commonly known as: COZAAR Take 25 mg by mouth daily.   magnesium oxide 400  MG tablet Commonly known as: MAG-OX Take 1 tablet (400 mg total) by mouth 3 (three) times daily. ALSO TAKE A EXTRA 400 MG TABLET ON THE DAYS YOU TAKE ZAROXOLYN TABLET What changed:  when to take this additional instructions   melatonin 3 MG Tabs tablet Take 6 mg by mouth at bedtime.   Menthol-Zinc Oxide 0.44-20.625 % Oint Apply 1 application topically daily as needed (irritation on the buttock).   pantoprazole 40 MG tablet Commonly known as: PROTONIX Take 1 tablet (40 mg total) by mouth daily. Replaces: omeprazole 20 MG capsule   polyethylene glycol 17 g packet Commonly known as: MIRALAX / GLYCOLAX Take 17 g by mouth daily.   propranolol 40 MG tablet Commonly known as: INDERAL Take 40 mg by mouth 3 (three) times daily.   sennosides-docusate sodium 8.6-50 MG tablet Commonly known as: SENOKOT-S Take 2 tablets by mouth daily. Take with prune juice.   vancomycin  IVPB Inject 1,750 mg into the vein daily for 24 days. Indication:  MRSA bacteremia/wound infection First Dose: Yes Last Day of Therapy:  12/13/20 Labs - Sunday/Monday:  CBC/D, BMP, and vancomycin trough. Labs - Thursday:  BMP and vancomycin trough Labs - Every other week:  ESR and CRP Method  of administration:Elastomeric Method of administration may be changed at the discretion of the patient and/or caregiver's ability to self-administer the medication ordered.   Vitamin B12 500 MCG Tabs Take 500 mcg by mouth daily.   vitamin C 500 MG tablet Commonly known as: ASCORBIC ACID Take 500 mg by mouth daily.   warfarin 6 MG tablet Commonly known as: COUMADIN Take 6 mg by mouth daily. (Take with $RemoveBe'5mg'EzPwxqqTP$  tablet to equal a total dose of $Remov'11mg'tuMLWR$ .)   warfarin 5 MG tablet Commonly known as: COUMADIN Take 5 mg by mouth daily. (Take with $RemoveBe'6mg'woASGskLc$  tablet to equal a total dose of $Remov'11mg'uLIyTz$ .)               Discharge Care Instructions  (From admission, onward)           Start     Ordered   11/20/20 0000  Change dressing on IV  access line weekly and PRN  (Home infusion instructions - [facility] )        11/20/20 0750   11/20/20 0000  Discharge wound care:       Comments: Wound care to chronic, full thickness Stage 4 pressure injury to coccygeal pressure injury :  Cleanse with NS, pat gently dry. Fill defect with silver hydrofiber dressing (Aquacel Ag+ Advantage, Lawson # [id number]), using a cotton-tipped applicator to fill any areas of undermining. Top with dry gauze, cover with silicone foam.  Change silver hydrofiber once daily, may reused foam for up to 3 days. Change PRN for soiling or rolling of dressing edges   11/20/20 0856             Follow-up Information     O'Buch, Greta, PA-C. Schedule an appointment as soon as possible for a visit in 1 week(s).   Specialty: Internal Medicine Contact information: [address] [place] [address] [phone]         Leonie Man, MD. Schedule an appointment as soon as possible for a visit in 2 week(s).   Specialty: Cardiology Contact information: [address] [place] [address] [phone]         Jabier Mutton, MD Follow up on 12/07/2020.   Specialty: Infectious Diseases Why: 9 am Contact information: [place] [address] [phone]                 Major procedures and Radiology Reports - PLEASE review detailed and final reports thoroughly  -      DG Knee 1-2 Views Right  Result Date: 11/16/2020 CLINICAL DATA:  MRSA bacteremia. EXAM: RIGHT KNEE - 1-2 VIEW COMPARISON:  07/19/2020 FINDINGS: Similar appearance of the medial unicompartmental arthroplasty of the right knee compared to 07/19/2020. No abnormal lucency tracking along the hardware components is currently identified to indicate loosening or infection along the prosthesis at this time. There is substantial articular space narrowing in the patellofemoral joint with associated extensive spurring, along with  mild marginal spurring in the lateral compartment. Stable lucency along the distal femoral metaphysis medially, potentially with mild surrounding sclerosis. This is not changed from 07/19/2020 but new compared to preoperative radiographs from 05/25/2014, significance uncertain. There is a trace knee effusion in the suprapatellar bursa. Equivocal thickening of the distal quadriceps tendon, along with some mild chronic heterotopic calcification along the distal quadriceps at the level of the distal femoral diaphysis. IMPRESSION: 1. Medial unicompartmental arthroplasty of the right knee without lucency along the margins to suggest prosthesis  loosening or infection. 2. Trace knee effusion. 3. Stable lucency along the distal femoral metaphysis medially potentially with mild surrounding sclerosis, no change from 07/19/2020. This could be some form of chronic postoperative reactive lesion but is technically nonspecific and was not appreciable on the preoperative radiograph of 05/25/2014. Chronic osteomyelitis is not entirely excluded although is not favored. 4. Suspected distal quadriceps tendinopathy. Electronically Signed   By: Van Clines M.D.   On: 11/16/2020 13:23   CT HEAD WO CONTRAST (5MM)  Result Date: 11/13/2020 CLINICAL DATA:  Altered mental status. EXAM: CT HEAD WITHOUT CONTRAST TECHNIQUE: Contiguous axial images were obtained from the base of the skull through the vertex without intravenous contrast. COMPARISON:  Jul 19, 2020 FINDINGS: Brain: There is mild cerebral atrophy with widening of the extra-axial spaces and ventricular dilatation. There are areas of decreased attenuation within the white matter tracts of the supratentorial brain, consistent with microvascular disease changes. Vascular: No hyperdense vessel or unexpected calcification. Skull: Normal. Negative for fracture or focal lesion. Sinuses/Orbits: No acute finding. Other: None. IMPRESSION: 1. Generalized cerebral atrophy. 2. No acute  intracranial abnormality. Electronically Signed   By: Virgina Norfolk M.D.   On: 11/13/2020 03:39   CT PELVIS W CONTRAST  Result Date: 11/14/2020 CLINICAL DATA:  Osteomyelitis. EXAM: CT PELVIS WITH CONTRAST TECHNIQUE: Multidetector CT imaging of the pelvis was performed using the standard protocol following the bolus administration of intravenous contrast. CONTRAST:  167mL OMNIPAQUE IOHEXOL 350 MG/ML SOLN COMPARISON:  Jul 19, 2020.  April 06, 2019. FINDINGS: Urinary Tract: Urinary bladder is decompressed secondary to Foley catheter. Bowel:  Unremarkable visualized pelvic bowel loops. Vascular/Lymphatic: No pathologically enlarged lymph nodes. No significant vascular abnormality seen. Reproductive: Status post hysterectomy. No definite adnexal abnormality is noted. Other: No ascites is noted. 6.8 cm presacral mass is noted most consistent with sacral myelolipoma as noted on prior MRI. Decubitus ulceration is seen overlying the lower sacrum and coccyx. Musculoskeletal: No suspicious bone lesions identified. Status post right total hip arthroplasty. No lytic destruction is seen to suggest osteomyelitis. IMPRESSION: Decubitus ulcer is seen overlying the lower sacrum and coccyx, but no underlying osteomyelitis is noted. Stable 6.8 cm presacral mass is noted most consistent with sacral myelolipoma as noted on prior MRI. Electronically Signed   By: Marijo Conception M.D.   On: 11/14/2020 15:41   MR KNEE LEFT WO CONTRAST  Result Date: 11/16/2020 CLINICAL DATA:  MRSA bacteremia, drainage and erythema from sacral wound, concern for potential septic arthritis EXAM: MRI OF THE LEFT KNEE WITHOUT CONTRAST TECHNIQUE: Multiplanar, multisequence MR imaging of the knee was performed. No intravenous contrast was administered. Three severely motion degraded sequences were obtained and the patient refused all further imaging. COMPARISON:  09/03/2017 radiographs FINDINGS: 3 severely motion degraded sequences are provided, which  substantially reduces diagnostic sensitivity and specificity. The patient refused other imaging. MENISCI Medial meniscus: Ill definition of the root of the posterior horn medial meniscus, cannot exclude radial tear. Lateral meniscus: Ill definition of the root of the posterior horn lateral meniscus, cannot exclude tear. LIGAMENTS Cruciates:  Grossly intact Collaterals:  Grossly intact CARTILAGE Patellofemoral: At least moderate chondral thinning with prominent spurring. Medial: Severe full-thickness articular cartilage loss. Prominent marginal spurring. Lateral:  Prominent marginal spurring. Joint:  Small knee effusion.  Suspected mild synovitis. Popliteal Fossa: Low-grade infiltrative edema but not disproportionate to the generalized subcutaneous edema. Extensor Mechanism:  Grossly intact Bones: Suspected chronic mild avascular necrosis in the distal femoral metaphysis. Other: 2.3 by 1.3 by 3.4  cm (volume = 5.3 cm^3) proteinaceous or hemorrhagic fluid signal intensity structure along the superficial fascia margin of the left proximal calf superficial to the fibula on image 23 series 5. This has high T2 and high T1 signal characteristics and also marginal low signal which may represent marginal hemosiderin deposition. Accordingly a chronic hematoma is favored. IMPRESSION: 1. Today's exam consists of 3 severely motion degraded sequences. The patient refused completion of the exam and any further MR imaging. This substantially reduces diagnostic sensitivity and specificity. 2. There is a small knee effusion with suspected mild synovitis. The effusion is less than I would typically expect in the setting of septic joint, and there is no overt marrow edema along the articular surfaces to indicate concomitant osteomyelitis. That said, early infection cannot be totally excluded based on the images provided. 3. Ill definition of the roots of the posterior horn medial meniscus and lateral meniscus, cannot exclude radial  tears. 4. Severe osteoarthritis especially in the medial compartment. 5. Generalized infiltrative edema in the subcutaneous tissues and popliteal region. 6. About 5.3 cm hematoma along the superficial fascia margin lateral to the proximal fibia, with hemosiderin along the margins suggesting some chronicity. 7. Mild chronic AVN in the distal femoral metaphysis. Electronically Signed   By: Van Clines M.D.   On: 11/16/2020 13:15   DG Chest Port 1 View  Result Date: 11/19/2020 CLINICAL DATA:  PICC line placement EXAM: PORTABLE CHEST 1 VIEW COMPARISON:  11/13/2020 FINDINGS: Interval placement of a right-sided PICC line with distal tip terminating at the level of the distal SVC. Stable cardiomegaly. Aortic atherosclerosis. Persistent small right pleural effusion. No pneumothorax. IMPRESSION: Interval placement of right-sided PICC line with distal tip terminating at the level of the distal SVC. No pneumothorax. Electronically Signed   By: Davina Poke D.O.   On: 11/19/2020 12:55   DG Chest Port 1 View  Result Date: 11/13/2020 CLINICAL DATA:  SOB (shortness of breath) R06.02 (ICD-10-CM) EXAM: PORTABLE CHEST 1 VIEW COMPARISON:  11/12/2020. FINDINGS: Similar mild enlargement cardiac silhouette. Similar pulmonary vascular congestion. Similar bibasilar opacities and probable small right pleural effusion. Mild interstitial prominence, possibly mild interstitial edema. No visible pneumothorax IMPRESSION: No substantial change from the prior. Similar cardiomegaly, pulmonary vascular congestion, probable small right pleural effusion and possible mild interstitial edema. Early pneumonia is not excluded at the lung bases with similar bibasilar opacities. Electronically Signed   By: Margaretha Sheffield M.D.   On: 11/13/2020 12:12   DG Chest Port 1 View  Result Date: 11/12/2020 CLINICAL DATA:  Questionable sepsis.  Evaluate for abnormality. EXAM: PORTABLE CHEST 1 VIEW COMPARISON:  Radiographs 04/06/2019 and  04/02/2019.  CT 07/19/2020. FINDINGS: 1556 hours. Right arm PICC has been removed. The heart remains enlarged. There is increased vascular congestion with patchy opacities at both lung bases and a probable small right pleural effusion. No evidence of pneumothorax. The bones appear unchanged. Telemetry leads overlie the chest. IMPRESSION: Cardiomegaly with vascular congestion, probable small right pleural effusion and probable mild edema. Cannot exclude early pneumonia at the lung bases. Radiographic follow up recommended. Electronically Signed   By: Richardean Sale M.D.   On: 11/12/2020 16:31   ECHOCARDIOGRAM LIMITED  Result Date: 11/14/2020    ECHOCARDIOGRAM LIMITED REPORT   Patient Name:   Donna [name] [place] Date of Exam: 11/14/2020 Medical Rec #:  [id number]   Height:       70.0 in Accession #:    [id number]  Weight:       252.0 lb  Date of Birth:  Jan 12, 1951  BSA:          2.303 m Patient Age:    57 years    BP:           123/66 mmHg Patient Gender: F           HR:           80 bpm. Exam Location:  Inpatient Procedure: Limited Echo, Color Doppler and Cardiac Doppler Indications:    Z56.38 Acute diastolic (congestive) heart failure  History:        Patient has prior history of Echocardiogram examinations, most                 recent 07/20/2020. CHF, Arrythmias:Atrial Fibrillation; Risk                 Factors:Hypertension, Diabetes and Sleep Apnea. Prior performed                 at [place]:    [place] Referring Phys: Arnell Asal Margaree Mackintosh [place]  1. Left ventricular ejection fraction, by estimation, is 60 to 65%. The left ventricle has normal function. The left ventricle has no regional wall motion abnormalities.  2. Right ventricular systolic function is mildly reduced. The right ventricular size is normal. There is severely elevated pulmonary artery systolic pressure.  3. Left atrial size was moderately dilated.  4. Right atrial size was severely dilated.  5. The mitral valve is  degenerative. Trivial mitral valve regurgitation. No evidence of mitral stenosis.  6. Tricuspid valve regurgitation is moderate.  7. Compared with the echo 08/2017, aortic valve mean gradient has increased frm 9 mmHg to 17 mmHg. The aortic valve is normal in structure. There is moderate calcification of the aortic valve. There is moderate thickening of the aortic valve. Aortic valve regurgitation is not visualized. Mild aortic valve stenosis.  8. The inferior vena cava is dilated in size with <50% respiratory variability, suggesting right atrial pressure of 15 mmHg. FINDINGS  Left Ventricle: Left ventricular ejection fraction, by estimation, is 60 to 65%. The left ventricle has normal function. The left ventricle has no regional wall motion abnormalities. The left ventricular internal cavity size was normal in size. There is  no left ventricular hypertrophy. Right Ventricle: The right ventricular size is normal. No increase in right ventricular wall thickness. Right ventricular systolic function is mildly reduced. There is severely elevated pulmonary artery systolic pressure. The tricuspid regurgitant velocity is 3.60 m/s, and with an assumed right atrial pressure of 15 mmHg, the estimated right ventricular systolic pressure is 75.6 mmHg. Left Atrium: Left atrial size was moderately dilated. Right Atrium: Right atrial size was severely dilated. Pericardium: There is no evidence of pericardial effusion. Mitral Valve: The mitral valve is degenerative in appearance. Mild mitral annular calcification. Trivial mitral valve regurgitation. No evidence of mitral valve stenosis. Tricuspid Valve: The tricuspid valve is normal in structure. Tricuspid valve regurgitation is moderate . No evidence of tricuspid stenosis. Aortic Valve: Compared with the echo 08/2017, aortic valve mean gradient has increased frm 9 mmHg to 17 mmHg. The aortic valve is normal in structure. There is moderate calcification of the aortic valve. There is  moderate thickening of the aortic valve. Aortic valve regurgitation is not visualized. Mild aortic stenosis is present. Aortic valve mean gradient measures 17.0 mmHg. Aortic valve peak gradient measures 26.6 mmHg. Aortic valve area, by VTI measures 1.14 cm. Pulmonic Valve: The pulmonic valve was normal in  structure. Pulmonic valve regurgitation is not visualized. No evidence of pulmonic stenosis. Aorta: The aortic root is normal in size and structure. Venous: The inferior vena cava is dilated in size with less than 50% respiratory variability, suggesting right atrial pressure of 15 mmHg. IAS/Shunts: No atrial level shunt detected by color flow Doppler. LEFT VENTRICLE PLAX 2D LVOT diam:     1.80 cm LV SV:         60 LV SV Index:   26 LVOT Area:     2.54 cm  AORTIC VALVE AV Area (Vmax):    1.15 cm AV Area (Vmean):   1.20 cm AV Area (VTI):     1.14 cm AV Vmax:           258.00 cm/s AV Vmean:          193.000 cm/s AV VTI:            0.521 m AV Peak Grad:      26.6 mmHg AV Mean Grad:      17.0 mmHg LVOT Vmax:         117.00 cm/s LVOT Vmean:        91.000 cm/s LVOT VTI:          0.234 m LVOT/AV VTI ratio: 0.45 TRICUSPID VALVE TR Peak grad:   51.8 mmHg TR Vmax:        360.00 cm/s  SHUNTS Systemic VTI:  0.23 m Systemic Diam: 1.80 cm Skeet Latch MD Electronically signed by Skeet Latch MD Signature Date/Time: 11/14/2020/5:37:02 PM    Final    [place] EKG SITE RITE  Result Date: 11/18/2020 If Site Rite image not attached, placement could not be confirmed due to current cardiac rhythm.      Today   Subjective    Donna [name] today has no headache,no chest abdominal pain,no new weakness tingling or numbness, feels much better   Objective   Blood pressure 116/69, pulse 92, temperature 98.6 F (37 C), temperature source Oral, resp. rate 16, height $RemoveBe'5\' 10"'fKoYnHaYy$  (1.778 m), weight 112.9 kg, SpO2 98 %.   Intake/Output Summary (Last 24 hours) at 11/20/2020 0905 Last data filed at 11/20/2020 0509 Gross per 24 hour   Intake 1049.58 ml  Output 1350 ml  Net -300.42 ml    Exam  Awake Alert x 1 - ? baseline, No new F.N deficits, some resting tremors, right arm PICC line site clean [place].AT,PERRAL Supple Neck,No JVD, No cervical lymphadenopathy appriciated.  Symmetrical Chest wall movement, Good air movement bilaterally, CTAB RRR,No Gallops, Rubs or new Murmurs, No Parasternal Heave +ve B.Sounds, Abd Soft, No tenderness, No organomegaly appriciated, No rebound - guarding or rigidity. No Cyanosis, Clubbing or edema, Stage sacral Decub POA - see W Care notes   Data Review   CBC w Diff:  Lab Results  Component Value Date   WBC 6.8 11/19/2020   HGB 9.9 (L) 11/19/2020   HGB 9.7 (L) 10/20/2017   HCT 33.8 (L) 11/19/2020   HCT 32.6 (L) 10/20/2017   PLT 194 11/19/2020   PLT 221 10/20/2017   LYMPHOPCT 14 11/19/2020   MONOPCT 10 11/19/2020   EOSPCT 4 11/19/2020   BASOPCT 0 11/19/2020    CMP:  Lab Results  Component Value Date   NA 138 11/19/2020   NA 144 03/25/2018   K 3.4 (L) 11/19/2020   CL 92 (L) 11/19/2020   CO2 38 (H) 11/19/2020   BUN 15 11/19/2020   BUN 22 03/25/2018   CREATININE 0.38 (L) 11/19/2020  CREATININE 1.36 (H) 03/24/2016   PROT 6.4 (L) 11/19/2020   PROT 7.0 02/02/2018   ALBUMIN 2.9 (L) 11/19/2020   ALBUMIN 4.3 02/02/2018   BILITOT 1.1 11/19/2020   BILITOT 0.4 02/02/2018   ALKPHOS 83 11/19/2020   AST 18 11/19/2020   ALT 8 11/19/2020   Lab Results  Component Value Date   HGBA1C 6.0 (H) 04/03/2019    Lab Results  Component Value Date   INR 2.2 (H) 11/19/2020   INR 2.4 (H) 11/19/2020   INR 3.2 (H) 11/18/2020      Total Time in preparing paper work, data evaluation and todays exam - 40 minutes  Lala Lund M.D on 11/20/2020 at 9:05 AM  [facility]

## 2020-11-21 DIAGNOSIS — I5032 Chronic diastolic (congestive) heart failure: Secondary | ICD-10-CM | POA: Diagnosis not present

## 2020-11-21 DIAGNOSIS — J9601 Acute respiratory failure with hypoxia: Secondary | ICD-10-CM | POA: Diagnosis not present

## 2020-11-21 DIAGNOSIS — R7881 Bacteremia: Secondary | ICD-10-CM | POA: Diagnosis not present

## 2020-11-21 DIAGNOSIS — I5031 Acute diastolic (congestive) heart failure: Secondary | ICD-10-CM | POA: Diagnosis not present

## 2020-11-22 DIAGNOSIS — J810 Acute pulmonary edema: Secondary | ICD-10-CM | POA: Diagnosis not present

## 2020-11-22 DIAGNOSIS — Z5181 Encounter for therapeutic drug level monitoring: Secondary | ICD-10-CM | POA: Diagnosis not present

## 2020-11-22 DIAGNOSIS — Z452 Encounter for adjustment and management of vascular access device: Secondary | ICD-10-CM | POA: Diagnosis not present

## 2020-11-22 DIAGNOSIS — I509 Heart failure, unspecified: Secondary | ICD-10-CM | POA: Diagnosis not present

## 2020-11-22 DIAGNOSIS — I517 Cardiomegaly: Secondary | ICD-10-CM | POA: Diagnosis not present

## 2020-11-23 DIAGNOSIS — I10 Essential (primary) hypertension: Secondary | ICD-10-CM | POA: Diagnosis not present

## 2020-11-23 DIAGNOSIS — F330 Major depressive disorder, recurrent, mild: Secondary | ICD-10-CM | POA: Diagnosis not present

## 2020-11-23 DIAGNOSIS — D649 Anemia, unspecified: Secondary | ICD-10-CM | POA: Diagnosis not present

## 2020-11-23 DIAGNOSIS — R54 Age-related physical debility: Secondary | ICD-10-CM | POA: Diagnosis not present

## 2020-11-23 DIAGNOSIS — G20 Parkinson's disease: Secondary | ICD-10-CM | POA: Diagnosis not present

## 2020-11-23 DIAGNOSIS — L89154 Pressure ulcer of sacral region, stage 4: Secondary | ICD-10-CM | POA: Diagnosis not present

## 2020-11-23 DIAGNOSIS — R634 Abnormal weight loss: Secondary | ICD-10-CM | POA: Diagnosis not present

## 2020-11-23 DIAGNOSIS — I4891 Unspecified atrial fibrillation: Secondary | ICD-10-CM | POA: Diagnosis not present

## 2020-11-23 DIAGNOSIS — F419 Anxiety disorder, unspecified: Secondary | ICD-10-CM | POA: Diagnosis not present

## 2020-11-23 DIAGNOSIS — R7881 Bacteremia: Secondary | ICD-10-CM | POA: Diagnosis not present

## 2020-11-23 DIAGNOSIS — Z5181 Encounter for therapeutic drug level monitoring: Secondary | ICD-10-CM | POA: Diagnosis not present

## 2020-11-23 DIAGNOSIS — D696 Thrombocytopenia, unspecified: Secondary | ICD-10-CM | POA: Diagnosis not present

## 2020-11-23 DIAGNOSIS — I5032 Chronic diastolic (congestive) heart failure: Secondary | ICD-10-CM | POA: Diagnosis not present

## 2020-11-23 DIAGNOSIS — G9340 Encephalopathy, unspecified: Secondary | ICD-10-CM | POA: Diagnosis not present

## 2020-11-23 DIAGNOSIS — R911 Solitary pulmonary nodule: Secondary | ICD-10-CM | POA: Diagnosis not present

## 2020-11-24 DIAGNOSIS — I4891 Unspecified atrial fibrillation: Secondary | ICD-10-CM | POA: Diagnosis not present

## 2020-11-24 DIAGNOSIS — L89154 Pressure ulcer of sacral region, stage 4: Secondary | ICD-10-CM | POA: Diagnosis not present

## 2020-11-24 DIAGNOSIS — I5032 Chronic diastolic (congestive) heart failure: Secondary | ICD-10-CM | POA: Diagnosis not present

## 2020-11-24 DIAGNOSIS — R54 Age-related physical debility: Secondary | ICD-10-CM | POA: Diagnosis not present

## 2020-11-24 DIAGNOSIS — R634 Abnormal weight loss: Secondary | ICD-10-CM | POA: Diagnosis not present

## 2020-11-24 DIAGNOSIS — G20 Parkinson's disease: Secondary | ICD-10-CM | POA: Diagnosis not present

## 2020-11-25 DIAGNOSIS — I4891 Unspecified atrial fibrillation: Secondary | ICD-10-CM | POA: Diagnosis not present

## 2020-11-25 DIAGNOSIS — L89154 Pressure ulcer of sacral region, stage 4: Secondary | ICD-10-CM | POA: Diagnosis not present

## 2020-11-25 DIAGNOSIS — G20 Parkinson's disease: Secondary | ICD-10-CM | POA: Diagnosis not present

## 2020-11-25 DIAGNOSIS — R54 Age-related physical debility: Secondary | ICD-10-CM | POA: Diagnosis not present

## 2020-11-25 DIAGNOSIS — R634 Abnormal weight loss: Secondary | ICD-10-CM | POA: Diagnosis not present

## 2020-11-25 DIAGNOSIS — I5032 Chronic diastolic (congestive) heart failure: Secondary | ICD-10-CM | POA: Diagnosis not present

## 2020-11-26 DIAGNOSIS — G20 Parkinson's disease: Secondary | ICD-10-CM | POA: Diagnosis not present

## 2020-11-26 DIAGNOSIS — I5032 Chronic diastolic (congestive) heart failure: Secondary | ICD-10-CM | POA: Diagnosis not present

## 2020-11-26 DIAGNOSIS — R634 Abnormal weight loss: Secondary | ICD-10-CM | POA: Diagnosis not present

## 2020-11-26 DIAGNOSIS — I4891 Unspecified atrial fibrillation: Secondary | ICD-10-CM | POA: Diagnosis not present

## 2020-11-26 DIAGNOSIS — R54 Age-related physical debility: Secondary | ICD-10-CM | POA: Diagnosis not present

## 2020-11-26 DIAGNOSIS — L89154 Pressure ulcer of sacral region, stage 4: Secondary | ICD-10-CM | POA: Diagnosis not present

## 2020-11-27 DIAGNOSIS — N390 Urinary tract infection, site not specified: Secondary | ICD-10-CM | POA: Diagnosis not present

## 2020-11-27 DIAGNOSIS — I4891 Unspecified atrial fibrillation: Secondary | ICD-10-CM | POA: Diagnosis not present

## 2020-11-27 DIAGNOSIS — L89154 Pressure ulcer of sacral region, stage 4: Secondary | ICD-10-CM | POA: Diagnosis not present

## 2020-11-27 DIAGNOSIS — R634 Abnormal weight loss: Secondary | ICD-10-CM | POA: Diagnosis not present

## 2020-11-27 DIAGNOSIS — I5032 Chronic diastolic (congestive) heart failure: Secondary | ICD-10-CM | POA: Diagnosis not present

## 2020-11-27 DIAGNOSIS — R54 Age-related physical debility: Secondary | ICD-10-CM | POA: Diagnosis not present

## 2020-11-27 DIAGNOSIS — G20 Parkinson's disease: Secondary | ICD-10-CM | POA: Diagnosis not present

## 2020-12-01 DEATH — deceased

## 2020-12-07 ENCOUNTER — Inpatient Hospital Stay: Payer: Medicare Other | Admitting: Internal Medicine
